# Patient Record
Sex: Female | Born: 1985 | Race: White | Hispanic: No | Marital: Single | Smoking: Never smoker
Health system: Southern US, Community
[De-identification: ages and names within clinical notes are randomized; demographics above are authoritative.]

## PROBLEM LIST (undated history)

## (undated) DIAGNOSIS — K589 Irritable bowel syndrome without diarrhea: Secondary | ICD-10-CM

## (undated) DIAGNOSIS — Q796 Ehlers-Danlos syndrome, unspecified: Secondary | ICD-10-CM

## (undated) DIAGNOSIS — F329 Major depressive disorder, single episode, unspecified: Secondary | ICD-10-CM

## (undated) DIAGNOSIS — K831 Obstruction of bile duct: Secondary | ICD-10-CM

## (undated) DIAGNOSIS — K529 Noninfective gastroenteritis and colitis, unspecified: Secondary | ICD-10-CM

## (undated) DIAGNOSIS — M40204 Unspecified kyphosis, thoracic region: Secondary | ICD-10-CM

## (undated) DIAGNOSIS — F419 Anxiety disorder, unspecified: Secondary | ICD-10-CM

## (undated) DIAGNOSIS — E230 Hypopituitarism: Secondary | ICD-10-CM

## (undated) DIAGNOSIS — Z8659 Personal history of other mental and behavioral disorders: Secondary | ICD-10-CM

## (undated) DIAGNOSIS — E282 Polycystic ovarian syndrome: Secondary | ICD-10-CM

## (undated) DIAGNOSIS — F4010 Social phobia, unspecified: Secondary | ICD-10-CM

## (undated) DIAGNOSIS — G4730 Sleep apnea, unspecified: Secondary | ICD-10-CM

## (undated) DIAGNOSIS — Z86018 Personal history of other benign neoplasm: Secondary | ICD-10-CM

## (undated) DIAGNOSIS — N938 Other specified abnormal uterine and vaginal bleeding: Secondary | ICD-10-CM

## (undated) DIAGNOSIS — E88819 Insulin resistance, unspecified: Secondary | ICD-10-CM

## (undated) DIAGNOSIS — K859 Acute pancreatitis without necrosis or infection, unspecified: Secondary | ICD-10-CM

## (undated) DIAGNOSIS — F4310 Post-traumatic stress disorder, unspecified: Secondary | ICD-10-CM

## (undated) DIAGNOSIS — J45909 Unspecified asthma, uncomplicated: Secondary | ICD-10-CM

## (undated) DIAGNOSIS — G43909 Migraine, unspecified, not intractable, without status migrainosus: Secondary | ICD-10-CM

## (undated) DIAGNOSIS — K219 Gastro-esophageal reflux disease without esophagitis: Secondary | ICD-10-CM

## (undated) DIAGNOSIS — Z8489 Family history of other specified conditions: Secondary | ICD-10-CM

## (undated) DIAGNOSIS — Q638 Other specified congenital malformations of kidney: Secondary | ICD-10-CM

## (undated) DIAGNOSIS — I499 Cardiac arrhythmia, unspecified: Secondary | ICD-10-CM

## (undated) DIAGNOSIS — I951 Orthostatic hypotension: Secondary | ICD-10-CM

## (undated) DIAGNOSIS — Z8719 Personal history of other diseases of the digestive system: Secondary | ICD-10-CM

## (undated) HISTORY — DX: Unspecified kyphosis, thoracic region: M40.204

## (undated) HISTORY — DX: Other specified congenital malformations of kidney: Q63.8

## (undated) HISTORY — DX: Migraine, unspecified, not intractable, without status migrainosus: G43.909

## (undated) HISTORY — DX: Acute pancreatitis without necrosis or infection, unspecified: K85.90

## (undated) HISTORY — DX: Obstruction of bile duct: K83.1

## (undated) HISTORY — PX: TONSILLECTOMY: SUR1361

## (undated) HISTORY — DX: Polycystic ovarian syndrome: E28.2

## (undated) HISTORY — PX: BLADDER SURGERY: SHX569

---
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
## 2007-01-19 ENCOUNTER — Encounter (INDEPENDENT_AMBULATORY_CARE_PROVIDER_SITE_OTHER): Payer: Self-pay | Admitting: Nurse Practitioner

## 2007-01-19 LAB — CONVERTED CEMR LAB: TSH: 2.64 microintl units/mL

## 2007-01-20 ENCOUNTER — Telehealth (INDEPENDENT_AMBULATORY_CARE_PROVIDER_SITE_OTHER): Payer: Self-pay | Admitting: *Deleted

## 2007-02-02 ENCOUNTER — Encounter (INDEPENDENT_AMBULATORY_CARE_PROVIDER_SITE_OTHER): Payer: Self-pay | Admitting: Nurse Practitioner

## 2007-02-02 ENCOUNTER — Ambulatory Visit: Payer: Self-pay | Admitting: Internal Medicine

## 2007-02-02 DIAGNOSIS — Q638 Other specified congenital malformations of kidney: Secondary | ICD-10-CM

## 2007-02-02 DIAGNOSIS — K869 Disease of pancreas, unspecified: Secondary | ICD-10-CM | POA: Insufficient documentation

## 2007-02-02 DIAGNOSIS — M4120 Other idiopathic scoliosis, site unspecified: Secondary | ICD-10-CM | POA: Insufficient documentation

## 2007-02-02 DIAGNOSIS — R9431 Abnormal electrocardiogram [ECG] [EKG]: Secondary | ICD-10-CM

## 2007-02-02 DIAGNOSIS — F988 Other specified behavioral and emotional disorders with onset usually occurring in childhood and adolescence: Secondary | ICD-10-CM | POA: Insufficient documentation

## 2007-02-02 DIAGNOSIS — R002 Palpitations: Secondary | ICD-10-CM

## 2007-02-02 DIAGNOSIS — J309 Allergic rhinitis, unspecified: Secondary | ICD-10-CM | POA: Insufficient documentation

## 2007-02-02 DIAGNOSIS — F4010 Social phobia, unspecified: Secondary | ICD-10-CM

## 2007-02-02 DIAGNOSIS — J4550 Severe persistent asthma, uncomplicated: Secondary | ICD-10-CM | POA: Insufficient documentation

## 2007-02-02 DIAGNOSIS — F810 Specific reading disorder: Secondary | ICD-10-CM | POA: Insufficient documentation

## 2007-02-03 ENCOUNTER — Encounter (INDEPENDENT_AMBULATORY_CARE_PROVIDER_SITE_OTHER): Payer: Self-pay | Admitting: Internal Medicine

## 2007-02-10 ENCOUNTER — Encounter (INDEPENDENT_AMBULATORY_CARE_PROVIDER_SITE_OTHER): Payer: Self-pay | Admitting: Nurse Practitioner

## 2007-02-10 ENCOUNTER — Ambulatory Visit (HOSPITAL_COMMUNITY): Admission: RE | Admit: 2007-02-10 | Discharge: 2007-02-10 | Payer: Self-pay | Admitting: Nurse Practitioner

## 2007-02-13 ENCOUNTER — Telehealth (INDEPENDENT_AMBULATORY_CARE_PROVIDER_SITE_OTHER): Payer: Self-pay | Admitting: Nurse Practitioner

## 2007-02-27 ENCOUNTER — Encounter (INDEPENDENT_AMBULATORY_CARE_PROVIDER_SITE_OTHER): Payer: Self-pay | Admitting: Nurse Practitioner

## 2007-03-12 ENCOUNTER — Encounter (INDEPENDENT_AMBULATORY_CARE_PROVIDER_SITE_OTHER): Payer: Self-pay | Admitting: Nurse Practitioner

## 2007-05-13 ENCOUNTER — Ambulatory Visit: Payer: Self-pay | Admitting: Nurse Practitioner

## 2007-05-13 LAB — CONVERTED CEMR LAB
AST: 17 units/L (ref 0–37)
Alkaline Phosphatase: 124 units/L — ABNORMAL HIGH (ref 39–117)
BUN: 11 mg/dL (ref 6–23)
Basophils Absolute: 0.1 10*3/uL (ref 0.0–0.1)
Basophils Relative: 1 % (ref 0–1)
Calcium: 9.9 mg/dL (ref 8.4–10.5)
Creatinine, Ser: 0.84 mg/dL (ref 0.40–1.20)
Eosinophils Absolute: 0.4 10*3/uL (ref 0.0–0.7)
Eosinophils Relative: 3 % (ref 0–5)
Hemoglobin: 14.6 g/dL (ref 12.0–15.0)
MCHC: 32.3 g/dL (ref 30.0–36.0)
MCV: 86.3 fL (ref 78.0–100.0)
Monocytes Absolute: 0.5 10*3/uL (ref 0.1–1.0)
Monocytes Relative: 5 % (ref 3–12)
RBC: 5.24 M/uL — ABNORMAL HIGH (ref 3.87–5.11)
RDW: 14.4 % (ref 11.5–15.5)
Total Bilirubin: 0.5 mg/dL (ref 0.3–1.2)

## 2008-02-05 DIAGNOSIS — I499 Cardiac arrhythmia, unspecified: Secondary | ICD-10-CM

## 2008-02-05 HISTORY — DX: Cardiac arrhythmia, unspecified: I49.9

## 2010-04-09 ENCOUNTER — Inpatient Hospital Stay (INDEPENDENT_AMBULATORY_CARE_PROVIDER_SITE_OTHER)
Admission: RE | Admit: 2010-04-09 | Discharge: 2010-04-09 | Disposition: A | Discharge status: Home / Self Care | Payer: Self-pay | Source: Ambulatory Visit | Attending: Family Medicine | Admitting: Family Medicine

## 2010-04-09 DIAGNOSIS — R197 Diarrhea, unspecified: Secondary | ICD-10-CM

## 2010-06-22 NOTE — Discharge Summary (Signed)
   NAME:  Kristen [name], Kristen [name]                       ACCOUNT NO.:  [id number]   MEDICAL RECORD NO.:  [id number]                   PATIENT TYPE:  OIB   LOCATION:  6123                                 FACILITY:  [facility]   PHYSICIAN:  Gabrielle Dare. Janee Morn, M.D.             DATE OF BIRTH:  08-09-1985   DATE OF ADMISSION:  11/26/2001  DATE OF DISCHARGE:  11/27/2001                                 DISCHARGE SUMMARY   DISCHARGE DIAGNOSIS:  Status post laparoscopic cholecystectomy.   HISTORY OF PRESENT ILLNESS:  The patient  is a 25 year old white female  who  had a recent episode of biliary pancreatitis managed as an  outpatient by  her primary care physician.  Workup at that time demonstrated cholelithiasis  so she scheduled for a laparoscopic cholecystectomy.   HOSPITAL COURSE:  The patient was brought to the hospital.  She underwent  laparoscopic cholecystectomy with intraoperative cholangiogram.  She  tolerated the procedure well without complications.  The cholangiogram  demonstrated no filling defects in the common bile duct, but did good flow  of contrast into the duodenum.  Postoperatively, she remained  hemodynamically stable.  She had good pain control and tolerated gradual  advancement of her diet and she is discharged home in stable condition.   DISCHARGE DIET:  Regular.   DISCHARGE ACTIVITIES:  As tolerated with no lifting.   DISCHARGE MEDICATIONS:  Percocet 5/325 one p.o. q.6h p.r.n.  pain.   FOLLOW UP:  The patient  is to call my office at [phone] to schedule an  appointment to follow up in two weeks.                                               Gabrielle Dare Janee Morn, M.D.    BET/MEDQ  D:  11/27/2001  T:  11/28/2001  Job:  [id number]

## 2010-06-22 NOTE — Op Note (Signed)
[place]  Patient:    Kristen [name], Kristen [name]                      MRN: [id number] Proc. Date: 06/15/99 Adm. Date:  [id number] Disc. Date: [id number] Attending:  Lucky Cowboy CC:         [facility]             Gretta Arab. Valentina Lucks, M.D.                           Operative Report  PREOPERATIVE DIAGNOSIS:  Recurrent tonsillitis, obstructive sleep apnea.  POSTOPERATIVE DIAGNOSIS:  Recurrent tonsillitis, obstructive sleep apnea.  OPERATION:  Adenotonsillectomy.  SURGEON:  Lucky Cowboy, M.D.  ANESTHESIA:  General endotracheal  ESTIMATED BLOOD LOSS:  50 cc  SPECIMENS:  Adenoid and tonsils.  COMPLICATIONS:  None.  INDICATION FOR PROCEDURE:  The patient is a 25 year old female with a two year history of intermittent throat infections.  There have been increasing episodes of tonsillitis.  She has had significant gaging when eating her food. There was constant mouth breathing and heavy snorer with periods of apnea during sleep.  FINDINGS:  The patient was noted to have bilateral kissing palatine tonsils in and a moderate amount of adenoid hypertrophy.  There was partial obstruction of the posterior choana, however, there was also significant edema of both inferior turbinates.  There was significant cryptic and somewhat necrotic appearing tonsils.  DESCRIPTION OF PROCEDURE:  The patient was taken to the operating room and placed on the table in the supine position.  She was then placed under general endotracheal anesthesia and the table rotated counter clockwise 90 degrees. Bacitracin ointment was placed on the lips.  The head and body were draped. A Crowe-Davis mouth with #3 tongue blade was then placed intraorally, open and suspended on the Mayo stand. Palpation of the soft palate was without evidence of a submucosal cleft.  Red rubber catheter was placed down the right nostril, brought out through the oral cavity and was secured in  place with hemostat. Inspection of the nasopharynx was performed using a mirror.  A medium sized adenoid curet was placed against the vomer, directed inferiorly, thus severing the majority of the adenoid pad.  The remainder was removed with tonsil [place]air forceps again under indirect visualization.  Three sterile gauze Afrin soaked packs were placed in the nasopharynx and time allowed for hemostasis. Soft palate was then relaxed and attention turned to the tonsillectomy portion of the procedure.  The right palatine tonsil was grasped with Allis clamps and directed inferior medially.  A #12 blade was used to make an incision on the anterior tonsillar pillar. Snare was then used to resect tonsil and two sterile gauze packs placed.  Attention was turned to the left palatine tonsil which was removed in identical fashion and two sterile gauze packs placed.  Mouth gag was relaxed and time allowed for hemostasis. Mouth gauze was removed after five minutes and all packs were removed.  Hemostasis was achieved in the adenoid pad and both tonsillar fossa using suction cautery.  Suction cautery was used under indirect visualization in the nasopharynx with mirror.  At this point, there was no residual bleeding.  The mouth gag was relaxed and reopened. An NG tube was passed down the esophagus for suction of the gastric contents. The mouth gag was then removed noting no damage to the teeth or soft  tissues.  Table was then rotated clock wise 90 degrees to its original position.  The patient was awakened from anesthesia and extubated in the operating room. She was taken to the post anesthesia care unit in stable condition.  There were no complications. DD:  06/15/99 TD:  06/18/99 Job: [id number]

## 2010-06-22 NOTE — Op Note (Signed)
NAME:  Kristen [name], Kristen [name]                       ACCOUNT NO.:  [id number]   MEDICAL RECORD NO.:  [id number]                   PATIENT TYPE:  OIB   LOCATION:  2867                                 FACILITY:  [facility]   PHYSICIAN:  Gabrielle Dare. Janee Morn, M.D.             DATE OF BIRTH:  04/05/1985   DATE OF PROCEDURE:  11/26/2001  DATE OF DISCHARGE:                                 OPERATIVE REPORT   PREOPERATIVE DIAGNOSES:  Cholelithiasis with recent biliary pancreatitis.   POSTOPERATIVE DIAGNOSES:  Cholelithiasis with recent biliary pancreatitis.   PROCEDURE:  Laparoscopic cholecystectomy with intraoperative cholangiogram.   SURGEON:  Gabrielle Dare. Janee Morn, M.D.   ASSISTANT:  [place]. Daphine Deutscher, M.D.   HISTORY OF PRESENT ILLNESS:  The patient is a 25 year old white female who  had a recent episode of biliary pancreatitis that was managed as an  outpatient by her primary care doctor.  Ultrasound and CT of the abdomen  revealed cholelithiasis, and she was evaluated and scheduled for elective  laparoscopic cholecystectomy with intraoperative cholangiogram.   PROCEDURE IN DETAIL:  The patient was brought to the operating room.  General anesthesia was administered.  The patient's abdomen was prepped and  draped in a sterile fashion.  A semilunar infraumbilical incision was made.  The subcutaneous tissues were dissected down.  The anterior fascia was  divided sharply, and the peritoneal cavity was entered without difficulty.  The Hasson trocar catheter was then placed after a pursestring 2-0 Vicryl  suture was placed in the fascia.  The Hasson was secured with its  pursestring, and subsequently under direct vision, the 10 mm epigastric and  two 5 mm lateral ports were placed.  The some of the gallbladder was  retracted superomedially, and the infundibulum was retracted  inferolaterally.  A few small adhesions were taken down bluntly from the  gallbladder.  Subsequently, the dissection commenced on  the lateral aspect  of the gallbladder and subsequently continued medially in the triangle of  Calot, and the cystic duct was easily identified.  The dissection continued  until a large window was made between cystic duct and the gallbladder and  the liver, and subsequently, a clip was placed at the floor of the cystic  duct towards the infundibulum.  A small nick was made in the cystic duct,  and the cholangiogram catheter was inserted.  Subsequently, an  intraoperative cholangiogram was shot, demonstrating no filling defects in  the distal common bile duct, and the contrast flowed into the duodenum  without difficulty.  Subsequently, the cholangiogram catheter was removed,  and three proximal clips were placed on the cystic duct, and it was divided.  Subsequently, further dissection in the area revealed the cystic artery.  This was clipped proximally and once distally and divided.  The clips going  into the gallbladder were taken off the liver bed with the hook cautery.  Several areas along the liver bed were cauterized for  good hemostasis.  The  gallbladder was removed from the liver bed and placed in an EndoCatch bag  and removed from the umbilical port.  Subsequently, the area in the right  upper quadrant was copiously irrigated.  The meticulous hemostasis was  ensured in the liver bed, and the clips remained in good position.  Subsequently, the irrigant fluid was removed from the abdomen once it  irrigated clear, and the ports were removed under direct vision, and the  Hasson was removed from the umbilical port.  Also, the umbilical incision  was closed by tieing the Vicryl pursestring suture.  Once this was  accomplished, all of the wounds  were irrigated and subsequently closed with running 4-0 Vicryl subcuticular  stitch.  Benzoin and Steri-Strips were applied.  Sponge and needle counts  were all correct.  The patient tolerated the procedure well without  complications and was  taken to the recovery room in stable condition.                                               Gabrielle Dare Janee Morn, M.D.    BET/MEDQ  D:  11/26/2001  T:  11/27/2001  Job:  [id number]

## 2010-11-12 ENCOUNTER — Inpatient Hospital Stay (INDEPENDENT_AMBULATORY_CARE_PROVIDER_SITE_OTHER)
Admission: RE | Admit: 2010-11-12 | Discharge: 2010-11-12 | Disposition: A | Discharge status: Home / Self Care | Payer: Self-pay | Source: Ambulatory Visit | Attending: Family Medicine | Admitting: Family Medicine

## 2010-11-12 DIAGNOSIS — N390 Urinary tract infection, site not specified: Secondary | ICD-10-CM

## 2010-11-12 LAB — POCT URINALYSIS DIP (DEVICE)
Bilirubin Urine: NEGATIVE
Ketones, ur: NEGATIVE mg/dL
Specific Gravity, Urine: 1.015 (ref 1.005–1.030)
pH: 6.5 (ref 5.0–8.0)

## 2011-02-05 DIAGNOSIS — F419 Anxiety disorder, unspecified: Secondary | ICD-10-CM

## 2011-02-05 HISTORY — DX: Anxiety disorder, unspecified: F41.9

## 2011-08-26 ENCOUNTER — Encounter (HOSPITAL_COMMUNITY): Payer: Self-pay | Admitting: *Deleted

## 2011-08-26 DIAGNOSIS — J45909 Unspecified asthma, uncomplicated: Secondary | ICD-10-CM | POA: Insufficient documentation

## 2011-08-26 DIAGNOSIS — R079 Chest pain, unspecified: Secondary | ICD-10-CM | POA: Insufficient documentation

## 2011-08-26 DIAGNOSIS — I10 Essential (primary) hypertension: Secondary | ICD-10-CM | POA: Insufficient documentation

## 2011-08-26 LAB — PREGNANCY, URINE: Preg Test, Ur: NEGATIVE

## 2011-08-26 NOTE — ED Notes (Signed)
The pt is c/o lt sided chest pain since this am..  She feels tired and both arms are heavy

## 2011-08-27 ENCOUNTER — Emergency Department (HOSPITAL_COMMUNITY): Payer: Self-pay

## 2011-08-27 ENCOUNTER — Emergency Department (HOSPITAL_COMMUNITY)
Admission: EM | Admit: 2011-08-27 | Discharge: 2011-08-27 | Disposition: A | Discharge status: Home / Self Care | Payer: Self-pay | Attending: Emergency Medicine | Admitting: Emergency Medicine

## 2011-08-27 DIAGNOSIS — R079 Chest pain, unspecified: Secondary | ICD-10-CM

## 2011-08-27 DIAGNOSIS — I493 Ventricular premature depolarization: Secondary | ICD-10-CM

## 2011-08-27 HISTORY — DX: Unspecified asthma, uncomplicated: J45.909

## 2011-08-27 HISTORY — DX: Acute pancreatitis without necrosis or infection, unspecified: K85.90

## 2011-08-27 LAB — COMPREHENSIVE METABOLIC PANEL
Albumin: 3.9 g/dL (ref 3.5–5.2)
BUN: 12 mg/dL (ref 6–23)
Chloride: 104 mEq/L (ref 96–112)
Creatinine, Ser: 0.91 mg/dL (ref 0.50–1.10)
GFR calc Af Amer: >90 mL/min (ref >90)
GFR calc non Af Amer: 88 mL/min — ABNORMAL LOW (ref >90)
Glucose, Bld: 86 mg/dL (ref 70–99)
Total Bilirubin: 0.5 mg/dL (ref 0.3–1.2)

## 2011-08-27 LAB — CBC WITH DIFFERENTIAL/PLATELET
Basophils Relative: 1 % (ref 0–1)
HCT: 43.5 % (ref 36.0–46.0)
Hemoglobin: 14.5 g/dL (ref 12.0–15.0)
Lymphs Abs: 3.9 10*3/uL (ref 0.7–4.0)
MCH: 29.2 pg (ref 26.0–34.0)
MCHC: 33.3 g/dL (ref 30.0–36.0)
Monocytes Absolute: 0.5 10*3/uL (ref 0.1–1.0)
Monocytes Relative: 5 % (ref 3–12)
Neutro Abs: 6.1 10*3/uL (ref 1.7–7.7)

## 2011-08-27 LAB — TROPONIN I: Troponin I: <0.3 ng/mL (ref <0.30)

## 2011-08-27 NOTE — ED Notes (Addendum)
Patient complaining of chest pain that woke her up from sleep this morning; patient states that location of chest pain is left-sided; pain is intermittent -- sometimes sharp and sometimes dull.  Patient rates pain 5/10 on the numerical pain scale.  Patient denies nausea, vomiting, diarrhea, shortness of breath, and diaphoresis.  Patient denies any respiratory complaints; reports history of asthma.  Patient alert and oriented x4; PERRL present.  Upon arrival to room, patient changed into gown and connected to continuous cardiac, pulse ox, and blood pressure monitor.  Will continue to monitor.

## 2011-08-27 NOTE — ED Notes (Signed)
Patient currently resting quietly in bed; no respiratory or acute distress noted.  Patient updated on plan of care; informed patient that we are currently waiting on orders from EDP; patient has no other questions or concerns at this time; will continue to monitor.

## 2011-08-27 NOTE — ED Provider Notes (Signed)
History     CSN: [id number]  Arrival date & time 08/26/11  2312   First MD Initiated Contact with Patient 08/27/11 0201      Chief Complaint  Patient presents with  . Chest Pain    (Consider location/radiation/quality/duration/timing/severity/associated sxs/prior treatment) Patient is a 26 y.o. female presenting with chest pain. The history is provided by the patient.  Chest Pain The chest pain began yesterday. Duration of episode(s) is 3 seconds. Chest pain occurs intermittently. The chest pain is unchanged. At its most intense, the pain is at 3/10. The pain is currently at 1/10. The severity of the pain is mild. The quality of the pain is described as aching and brief. The pain does not radiate. Primary symptoms include fatigue. Pertinent negatives for primary symptoms include no syncope, no shortness of breath, no cough, no wheezing, no nausea and no vomiting. She tried nothing for the symptoms.     Past Medical History  Diagnosis Date  . Pancreatitis   . Hypertension   . Asthma     History reviewed. No pertinent past surgical history.  No family history on file.  History  Substance Use Topics  . Smoking status: Never Smoker   . Smokeless tobacco: Not on file  . Alcohol Use: No    OB History    Grav Para Term Preterm Abortions TAB SAB Ect Mult Living                  Review of Systems  Constitutional: Positive for fatigue.  Respiratory: Negative for cough, shortness of breath and wheezing.   Cardiovascular: Positive for chest pain. Negative for syncope.  Gastrointestinal: Negative for nausea and vomiting.  All other systems reviewed and are negative.    Allergies  Mucinex  Home Medications  No current outpatient prescriptions on file.  BP 116/61  Pulse 86  Temp 99 F (37.2 C) (Oral)  Resp 15  SpO2 99%  LMP 08/25/2011  Physical Exam  Constitutional: She is oriented to person, place, and time. She appears well-developed and well-nourished.  HENT:   Head: Normocephalic and atraumatic.  Eyes: Conjunctivae and EOM are normal. Pupils are equal, round, and reactive to light.  Neck: Normal range of motion.  Cardiovascular: Normal rate, regular rhythm and normal heart sounds.   Pulmonary/Chest: Effort normal and breath sounds normal.  Abdominal: Soft. Bowel sounds are normal.  Musculoskeletal: Normal range of motion.  Neurological: She is alert and oriented to person, place, and time.  Skin: Skin is warm and dry.  Psychiatric: She has a normal mood and affect. Her behavior is normal.    ED Course  Procedures (including critical care time)  Labs Reviewed  CBC WITH DIFFERENTIAL - Abnormal; Notable for the following:    WBC 10.9 (*)     All other components within normal limits  COMPREHENSIVE METABOLIC PANEL - Abnormal; Notable for the following:    GFR calc non Af Amer 88 (*)     All other components within normal limits  TROPONIN I  PREGNANCY, URINE  D-DIMER, QUANTITATIVE  URINALYSIS, ROUTINE W REFLEX MICROSCOPIC   Dg Chest Port 1 View  08/27/2011  *RADIOLOGY REPORT*  Clinical Data: Chest pain.  PORTABLE CHEST - 1 VIEW  Comparison: None  Findings: Very low lung volumes with vascular crowding and atelectasis.  Heart size is grossly normal.  No definite effusions or pneumothorax.  IMPRESSION: Very low lung volumes with vascular crowding and atelectasis.  Original Report Authenticated By: P. Loralie Champagne, M.D.  No diagnosis found.   Date: 08/27/2011  Rate: 86  Rhythm: normal sinus rhythm  QRS Axis: normal  Intervals: normal  ST/T Wave abnormalities: normal  Conduction Disutrbances: frequent pvc  Narrative Interpretation: frequent pvc     MDM  Pt with pvc.  ce neg.  D-dimer neg.  Improved.  Will dc to oupt cards fu,  Ret new/worsening sxs        Yennifer Segovia Lytle Michaels, MD 08/27/11 [phone]

## 2012-01-10 ENCOUNTER — Encounter (HOSPITAL_COMMUNITY): Payer: Self-pay

## 2012-01-10 ENCOUNTER — Other Ambulatory Visit: Payer: Self-pay | Admitting: Obstetrics and Gynecology

## 2012-01-10 ENCOUNTER — Ambulatory Visit (HOSPITAL_COMMUNITY)
Admission: RE | Admit: 2012-01-10 | Discharge: 2012-01-10 | Disposition: A | Discharge status: Home / Self Care | Payer: Self-pay | Source: Ambulatory Visit | Attending: Obstetrics and Gynecology | Admitting: Obstetrics and Gynecology

## 2012-01-10 VITALS — BP 112/76 | Temp 97.9°F | Ht 67.0 in | Wt 303.2 lb

## 2012-01-10 DIAGNOSIS — N6489 Other specified disorders of breast: Secondary | ICD-10-CM

## 2012-01-10 DIAGNOSIS — Z01419 Encounter for gynecological examination (general) (routine) without abnormal findings: Secondary | ICD-10-CM

## 2012-01-10 DIAGNOSIS — N649 Disorder of breast, unspecified: Secondary | ICD-10-CM

## 2012-01-10 NOTE — Patient Instructions (Signed)
Taught patient how to perform BSE and gave educational materials to take home. Let her know [facility] will cover Pap smears every 3 years unless has a history of abnormal Pap smears. Patient referred to the [facility] for left breast ultrasound. Appointment scheduled for Monday, January 20, 2012 at 1430. Patient aware of appointment and will be there. Let patient know will follow up with her within the next couple weeks with results by letter or phone. Patient verbalized understanding.

## 2012-01-10 NOTE — Addendum Note (Signed)
Encounter addended by: Saintclair Halsted, RN on: 01/10/2012  4:52 PM<BR>     Documentation filed: Visit Diagnoses

## 2012-01-10 NOTE — Progress Notes (Addendum)
Complaints of knot on left breast that keeps coming back and bursting x 2 months.  Pap Smear:    Pap smear completed today. Per patient last Pap smear was 4 years ago at the [facility] and normal. Per patient no history of abnormal Pap smears. Pap smear results above are in EPIC.  Physical exam: Breasts Breasts symmetrical. No skin abnormalities right breast. Lump on left breast more at the surface at 5 o'clock that is scabbed over. Per patient the area had bursted.  No nipple retraction bilateral breasts. No nipple discharge bilateral breasts. No lymphadenopathy. No lumps palpated bilateral breasts. No complaints of pain or tenderness on exam. Patient referred to the [facility] for left breast ultrasound. Appointment scheduled for Monday, January 20, 2012 at 1430.            Pelvic/Bimanual   Ext Genitalia No lesions, no swelling and no discharge observed on external genitalia.         Vagina Vagina pink and normal texture. No lesions or discharge observed in vagina.          Cervix Cervix is present. Cervix pink and of normal texture. No discharge observed.     Uterus Uterus is present and palpable. Uterus in normal position and normal size.        Adnexae Bilateral ovaries present and unable to palpate. No tenderness on palpation.          Rectovaginal No rectal exam completed today since patient had no rectal complaints. No skin abnormalities observed on exam.

## 2012-01-20 ENCOUNTER — Other Ambulatory Visit: Payer: Self-pay

## 2012-01-23 ENCOUNTER — Ambulatory Visit
Admission: RE | Admit: 2012-01-23 | Discharge: 2012-01-23 | Disposition: A | Discharge status: Home / Self Care | Payer: No Typology Code available for payment source | Source: Ambulatory Visit | Attending: Obstetrics and Gynecology | Admitting: Obstetrics and Gynecology

## 2012-01-23 DIAGNOSIS — N6489 Other specified disorders of breast: Secondary | ICD-10-CM

## 2012-01-23 DIAGNOSIS — N649 Disorder of breast, unspecified: Secondary | ICD-10-CM

## 2012-01-27 ENCOUNTER — Telehealth (HOSPITAL_COMMUNITY): Payer: Self-pay | Admitting: *Deleted

## 2012-01-27 NOTE — Telephone Encounter (Signed)
Telephoned patient and left message to return call to [facility] 

## 2012-01-27 NOTE — Telephone Encounter (Signed)
Patient returned call and advised of normal pap smear results. Next pap smear due in 3 years. Patient voiced understanding.

## 2012-02-05 DIAGNOSIS — E282 Polycystic ovarian syndrome: Secondary | ICD-10-CM

## 2012-02-05 HISTORY — DX: Polycystic ovarian syndrome: E28.2

## 2012-02-06 NOTE — Addendum Note (Signed)
Encounter addended by: Apolonio Cutting Poll Kendale Rembold, RN on: 02/06/2012  4:53 PM<BR>     Documentation filed: Visit Diagnoses, Charges VN

## 2012-03-06 ENCOUNTER — Inpatient Hospital Stay (HOSPITAL_COMMUNITY)
Admission: AD | Admit: 2012-03-06 | Discharge: 2012-03-06 | Disposition: A | Discharge status: Home / Self Care | Payer: Self-pay | Source: Ambulatory Visit | Attending: Obstetrics and Gynecology | Admitting: Obstetrics and Gynecology

## 2012-03-06 DIAGNOSIS — Z3202 Encounter for pregnancy test, result negative: Secondary | ICD-10-CM | POA: Insufficient documentation

## 2012-03-06 DIAGNOSIS — N938 Other specified abnormal uterine and vaginal bleeding: Secondary | ICD-10-CM | POA: Insufficient documentation

## 2012-03-06 DIAGNOSIS — N949 Unspecified condition associated with female genital organs and menstrual cycle: Secondary | ICD-10-CM | POA: Insufficient documentation

## 2012-03-06 DIAGNOSIS — L732 Hidradenitis suppurativa: Secondary | ICD-10-CM | POA: Insufficient documentation

## 2012-03-06 DIAGNOSIS — N926 Irregular menstruation, unspecified: Secondary | ICD-10-CM

## 2012-03-06 MED ORDER — NORGESTIMATE-ETH ESTRADIOL 0.25-35 MG-MCG PO TABS: 1.0000 | ORAL_TABLET | Freq: Every day | ORAL | Status: DC

## 2012-03-06 NOTE — MAU Note (Signed)
Patient states she has had 3 negative home pregnancy tests but has not had a period since 11-18. Had one day of spotting since 11-18. Patient denies any pain or bleeding at this time.

## 2012-03-06 NOTE — MAU Provider Note (Signed)
Attestation of Attending Supervision of Advanced Practitioner (CNM/NP): Evaluation and management procedures were performed by the Advanced Practitioner under my supervision and collaboration.  I have reviewed the Advanced Practitioner's note and chart, and I agree with the management and plan.  Vieva Brummitt 03/06/2012 9:19 PM   

## 2012-03-06 NOTE — MAU Provider Note (Signed)
S: 27 y.o. G0P0 presents to MAU for pregnancy test.  She has had 3 negative tests at home but no menses since November.  She has always had irregular menses when not on birth control.  She also mentioned cysts under her breasts, underarms, and inner thighs.  These are not painful, have no odor, and she has had them since early adolescence.  They also run in her family.  She has never had a diagnosis for them.  She denies pain, vaginal bleeding, n/v, h/a, urinary symptoms, vaginal itching/burning, or fever/chills.  O: BP 139/90  Pulse 97  Temp 97.5 F (36.4 C) (Oral)  Resp 20  Ht 5' 5.5" (1.664 m)  Wt 139.164 kg (306 lb 12.8 oz)  BMI 50.28 kg/m2  SpO2 98%  LMP 12/23/2011  Results for orders placed during the hospital encounter of 03/06/12 (from the past 24 hour(s))  POCT PREGNANCY, URINE     Status: Normal   Collection Time   03/06/12  2:35 PM      Component Value Range   Preg Test, Ur NEGATIVE  NEGATIVE   A: 1. Hydradenitis   2. Negative pregnancy test   3. Irregular menses     P:  D/C home OCPs x3 months Discussed skin symptoms with pt and diagnosis of hydradenitis.  Programme researcher, broadcasting/film/video given. Follow up in [facility] in 2-3 months, message sent today. Return to MAU as needed   Sharen Counter Certified Nurse-Midwife

## 2012-03-10 ENCOUNTER — Encounter: Payer: Self-pay | Admitting: Medical

## 2012-04-17 ENCOUNTER — Ambulatory Visit (INDEPENDENT_AMBULATORY_CARE_PROVIDER_SITE_OTHER): Payer: Self-pay | Admitting: Medical

## 2012-04-17 ENCOUNTER — Encounter: Payer: Self-pay | Admitting: Medical

## 2012-04-17 VITALS — BP 136/81 | HR 61 | Ht 68.0 in | Wt 309.3 lb

## 2012-04-17 DIAGNOSIS — N926 Irregular menstruation, unspecified: Secondary | ICD-10-CM

## 2012-04-17 MED ORDER — NORGESTIMATE-ETH ESTRADIOL 0.25-35 MG-MCG PO TABS: 1.0000 | ORAL_TABLET | Freq: Every day | ORAL | Status: DC

## 2012-04-17 NOTE — Progress Notes (Signed)
Patient ID: Kristen [name], female   DOB: [birth year], 27 y.o.   MRN: [id number]  History:  Kristen [name]  is a 27 y.o. G0P0 who presents to clinic today for follow-up from MAU. The patient was seen in MAU on 03/06/12 for irregular menses. She was given OCPs at the time to help regulate her cycles. She has had two regular cycles since then without complications. LMP 04/16/12. The patient states that periods have been lighter without cramping. She denies N/V or headaches. The patient states that her history of HTN was strictly stress induced and when she was put on medication her BP dropped causing lightheadedness and near syncope. BP is ok today after 2 months of OCPs.   The following portions of the patient's history were reviewed and updated as appropriate: allergies, current medications, past family history, past medical history, past social history, past surgical history and problem list.  Review of Systems:  Pertinent items are noted in HPI.  Objective:  Physical Exam BP 136/81  Pulse 61  Ht 5\' 8"  (1.727 m)  Wt 309 lb 4.8 oz (140.298 kg)  BMI 47.04 kg/m2  LMP 04/16/2012 GENERAL: Well-developed, obese female in no acute distress.  HEENT: Normocephalic, atraumatic.  LUNGS: Normal rate. Clear to auscultation bilaterally.  HEART: Regular rate and rhythm with no adventitious sounds.  EXTREMITIES: No cyanosis, clubbing, or edema. SKIN: Warm, dry and without erythema  Assessment & Plan:  Assessment: Irregular menses, most likely PCOS  Plans: Rx for Sprintec sent to patient's pharmacy (refills x 11) Patient will return to clinic in 3 months for BP check Patient will return in one year for annual exam and discussion of further management or sooner PRN  [name], PA-C 04/17/2012 8:46 AM

## 2012-04-17 NOTE — Patient Instructions (Signed)
Oral Contraception Use Oral contraceptives (OCs) are medicines taken to prevent pregnancy. OCs work by preventing the ovaries from releasing eggs. The hormones in OCs also cause the cervical mucus to thicken, preventing the sperm from entering the uterus. The hormones also cause the uterine lining to become thin, not allowing a fertilized egg to attach to the inside of the uterus. OCs are highly effective when taken exactly as prescribed. However, OCs do not prevent sexually transmitted diseases (STDs). Safe sex practices, such as using condoms along with an OC, can help prevent STDs.  Before taking OCs, you may have a physical exam and Pap test. Your caregiver may also order blood tests if necessary. Your caregiver will make sure you are a good candidate for oral contraception. Discuss with your caregiver the possible side effects of the OC you may be prescribed. When starting an OC, it can take 2 to 3 months for the body to adjust to the changes in hormone levels in your body.  HOW TO TAKE ORAL CONTRACEPTIVES Your caregiver may advise you on how to start taking the first cycle of OCs. Otherwise, you can:  Start on day 1 of your menstrual period. You will not need any backup contraceptive protection with this start time.  Start on the first Sunday after your menstrual period or the day you get your prescription. In these cases, you will need to use backup contraceptive protection for the first 7-day cycle. After you have started taking OCs:  If you forget to take 1 pill, take it as soon as you remember. Take the next pill at the regular time.  If you miss 2 or more pills, use backup birth control until your next menstrual period starts.  If you use a 28-day pack that contains inactive pills and you miss 1 of the last 7 pills (pills with no hormones), it will not matter. Throw away the rest of the non-hormone pills and start a new pill pack. No matter which day you start the OC, you will always start  a new pack on that same day of the week. Have an extra pack of OCs and a backup contraceptive method available in case you miss some pills or lose your OC pack. HOME CARE INSTRUCTIONS   Do not smoke.  Always use a condom to protect against STDs. OCs do not protect against STDs.  Use a calendar to mark your menstrual period days.  Read the information and directions that come with your OC. Talk to your caregiver if you have questions. SEEK MEDICAL CARE IF:   You develop nausea and vomiting.  You have abnormal vaginal discharge or bleeding.  You develop a rash.  You miss your menstrual period.  You are losing your hair.  You need treatment for mood swings or depression.  You get dizzy when taking the OC.  You develop acne from taking the OC.  You become pregnant. SEEK IMMEDIATE MEDICAL CARE IF:   You develop chest pain.  You develop shortness of breath.  You have an uncontrolled or severe headache.  You develop numbness or slurred speech.  You develop visual problems.  You develop pain, redness, and swelling in the legs. Document Released: 01/10/2011 Document Revised: 04/15/2011 Document Reviewed: 01/10/2011 Exit[employer] Patient Information 2013 [employer].  

## 2013-06-26 ENCOUNTER — Encounter (HOSPITAL_COMMUNITY): Payer: Self-pay | Admitting: Emergency Medicine

## 2013-06-26 ENCOUNTER — Emergency Department (HOSPITAL_COMMUNITY): Payer: Self-pay

## 2013-06-26 ENCOUNTER — Emergency Department (HOSPITAL_COMMUNITY)
Admission: EM | Admit: 2013-06-26 | Discharge: 2013-06-26 | Disposition: A | Discharge status: Home / Self Care | Payer: Self-pay | Attending: Emergency Medicine | Admitting: Emergency Medicine

## 2013-06-26 DIAGNOSIS — T148XXA Other injury of unspecified body region, initial encounter: Secondary | ICD-10-CM

## 2013-06-26 DIAGNOSIS — IMO0002 Reserved for concepts with insufficient information to code with codable children: Secondary | ICD-10-CM | POA: Insufficient documentation

## 2013-06-26 DIAGNOSIS — Y9389 Activity, other specified: Secondary | ICD-10-CM | POA: Insufficient documentation

## 2013-06-26 DIAGNOSIS — M25561 Pain in right knee: Secondary | ICD-10-CM

## 2013-06-26 DIAGNOSIS — Z8719 Personal history of other diseases of the digestive system: Secondary | ICD-10-CM | POA: Insufficient documentation

## 2013-06-26 DIAGNOSIS — Z79899 Other long term (current) drug therapy: Secondary | ICD-10-CM | POA: Insufficient documentation

## 2013-06-26 DIAGNOSIS — I10 Essential (primary) hypertension: Secondary | ICD-10-CM | POA: Insufficient documentation

## 2013-06-26 DIAGNOSIS — S62609A Fracture of unspecified phalanx of unspecified finger, initial encounter for closed fracture: Secondary | ICD-10-CM

## 2013-06-26 DIAGNOSIS — J45909 Unspecified asthma, uncomplicated: Secondary | ICD-10-CM | POA: Insufficient documentation

## 2013-06-26 DIAGNOSIS — Y9289 Other specified places as the place of occurrence of the external cause: Secondary | ICD-10-CM | POA: Insufficient documentation

## 2013-06-26 MED ORDER — HYDROCODONE-ACETAMINOPHEN 5-325 MG PO TABS: ORAL_TABLET | ORAL | Status: DC

## 2013-06-26 NOTE — Discharge Instructions (Signed)
Rest, Ice intermittently (in the first 24-48 hours), Gentle compression with an Ace wrap, and elevate (Limb above the level of the heart) °  °Take up to 800mg of ibuprofen (that is usually 4 over the counter pills)  3 times a day for 5 days. Take with food. ° °Take vicodin for breakthrough pain, do not drink alcohol, drive, care for children or do other critical tasks while taking vicodin. ° °Do not hesitate to return to the Emergency Department for any new, worsening or concerning symptoms.  ° °If you do not have a primary care doctor you can establish one at the  ° °[facility]: °[address] °[place]  [address] °[phone] ° °After you establish care. Let them know you were seen in the emergency room. They must obtain records for further management.  ° ° ° °

## 2013-06-26 NOTE — ED Notes (Addendum)
Pt present to ED for fall. Pt states she tripped and fell, hit ground with right knee and right hand. Bruise, swelling and abrasion noted and right knee. Pt not able to bend right knee. Swelling and deformity noted at right ring finger. Sensation and right pedal and radial pulse present. Pt states she had Tetanus vaccine within 2013/2014.

## 2013-06-26 NOTE — ED Notes (Signed)
Ortho notified to apply splint

## 2013-06-26 NOTE — Progress Notes (Signed)
Orthopedic Tech Progress Note Patient Details:  Kristen [name] 1985/10/08 [id number]  Ortho Devices Type of Ortho Device: Crutches;Knee Immobilizer;Finger splint Ortho Device/Splint Location: rue/l;le Ortho Device/Splint Interventions: Application   [name] 06/26/2013, 10:31 PM

## 2013-06-26 NOTE — ED Provider Notes (Signed)
CSN: [id number]     Arrival date & time 06/26/13  1942 History  This chart was scribed for non-physician practitioner Wynetta EmeryNicole Vertis Bauder  working with Shanna CiscoMegan E Docherty, MD by Elveria Risingimelie Horne, ED Scribe. This patient was seen in room TR09C/TR09C and the patient's care was started at 8:07 PM.   Chief Complaint  Patient presents with  . Knee Injury  . Hand Injury      The history is provided by the patient. No language interpreter was used.   HPI Comments: Kristen LaserStephanie Y Ticas is a 28 y.o. female who presents to the Emergency Department with right knee and right hand injury after a fall that occurred less than two hours ago. Patient reports attempting to get out of the back seat of a two door car, tangling her foot in the seat belt, falling to the ground, and landing on her right knee and right hand. Patient has abrasions and swelling of the right knee and pain and bruising at her right ring finger. Patient denies right ankle pain.  Patient is ambulatory with pain.   Patient is right hand dominant.    Past Medical History  Diagnosis Date  . Pancreatitis   . Hypertension   . Asthma    Past Surgical History  Procedure Laterality Date  . Cholecystectomy     Family History  Problem Relation Age of Onset  . Hypertension Mother   . Hypertension Father   . Diabetes Maternal Grandmother   . Hypertension Maternal Grandmother   . Cancer Paternal Grandmother     colon   History  Substance Use Topics  . Smoking status: Never Smoker   . Smokeless tobacco: Not on file  . Alcohol Use: No   OB History   Grav Para Term Preterm Abortions TAB SAB Ect Mult Living   0              Review of Systems A complete 10 system review of systems was obtained and all systems are negative except as noted in the HPI and PMH.    Allergies  Mucinex  Home Medications   Prior to Admission medications   Medication Sig Start Date End Date Taking? Authorizing Provider  norgestimate-ethinyl estradiol  (ORTHO-CYCLEN,SPRINTEC,PREVIFEM) 0.25-35 MG-MCG tablet Take 1 tablet by mouth daily. 04/17/12   Freddi StarrJulie N Ethier, PA-C  sertraline (ZOLOFT) 100 MG tablet Take 100 mg by mouth daily.    Historical Provider, MD   Triage Vitals: BP 125/84  Pulse 86  Temp(Src) 98.8 F (37.1 C) (Oral)  Resp 18  SpO2 98% Physical Exam  Nursing note and vitals reviewed. Constitutional: She is oriented to person, place, and time. She appears well-developed and well-nourished. No distress.  HENT:  Head: Normocephalic and atraumatic.  Mouth/Throat: Oropharynx is clear and moist.  Eyes: Conjunctivae and EOM are normal. Pupils are equal, round, and reactive to light.  Neck: Normal range of motion. Neck supple. No tracheal deviation present.  Cardiovascular: Normal rate, regular rhythm, normal heart sounds and intact distal pulses.   Pulmonary/Chest: Effort normal and breath sounds normal. No respiratory distress. She has no wheezes. She has no rales. She exhibits no tenderness.  Abdominal: Soft. Bowel sounds are normal. She exhibits no distension and no mass. There is no tenderness. There is no rebound and no guarding.  Musculoskeletal: She exhibits tenderness.  Bruising to the right fourth digit PIP on the volar aspect. No snuffbox tenderness palpation. Neurovascularly intact, excellent range of motion.  Right knee: Partial thickness abrasion, stable to  valgus and varus stress, anterior and posterior drawer normal.  Neurological: She is alert and oriented to person, place, and time.  Skin: Skin is warm and dry.  Psychiatric: She has a normal mood and affect. Her behavior is normal.    ED Course  Procedures (including critical care time) DIAGNOSTIC STUDIES: Oxygen Saturation is 98% on room air, normal by my interpretation.    COORDINATION OF CARE: 8:13 PM- Discussed treatment plan with patient at bedside and patient agreed to plan.    Labs Review Labs Reviewed - No data to display  Imaging Review No  results found.   EKG Interpretation None      MDM   Final diagnoses:  None    Filed Vitals:   06/26/13 1947 06/26/13 2233  BP: 125/84 130/86  Pulse: 86 87  Temp: 98.8 F (37.1 C) 98.7 F (37.1 C)  TempSrc: Oral Oral  Resp: 18 16  SpO2: 98% 100%    Medications - No data to display  Kristen [name] is a 28 y.o. female presenting with right finger and right knee pain with abrasion status post slip and fall. X-ray shows fracture to the volar base of the middle phalanx of the right fourth digit. Patient care placed in splint, advised rice, advised close followup with hand surgery as this is her dominant hand. Knee film is negative, patient placed in immobilizer and given crutches, asked to follow with general orthopedist if pain persists.  Evaluation does not show pathology that would require ongoing emergent intervention or inpatient treatment. Pt is hemodynamically stable and mentating appropriately. Discussed findings and plan with patient/guardian, who agrees with care plan. All questions answered. Return precautions discussed and outpatient follow up given.   Discharge Medication List as of 06/26/2013 10:11 PM    START taking these medications   Details  HYDROcodone-acetaminophen (NORCO/VICODIN) 5-325 MG per tablet Take 1-2 tablets by mouth every 6 hours as needed for pain., Print        Note: Portions of this report may have been transcribed using voice recognition software. Every effort was made to ensure accuracy; however, inadvertent computerized transcription errors may be present   I personally performed the services described in this documentation, which was scribed in my presence. The recorded information has been reviewed and is accurate.    Wynetta Emery, PA-C 06/27/13 [phone]

## 2013-06-27 NOTE — ED Provider Notes (Signed)
Medical screening examination/treatment/procedure(s) were performed by non-physician practitioner and as supervising physician I was immediately available for consultation/collaboration.   Cerria Randhawa E Cristie Mckinney, MD 06/27/13 1047 

## 2013-07-10 ENCOUNTER — Encounter (HOSPITAL_COMMUNITY): Payer: Self-pay | Admitting: Emergency Medicine

## 2013-07-10 ENCOUNTER — Emergency Department (HOSPITAL_COMMUNITY)
Admission: EM | Admit: 2013-07-10 | Discharge: 2013-07-10 | Disposition: A | Discharge status: Home / Self Care | Payer: Self-pay | Attending: Emergency Medicine | Admitting: Emergency Medicine

## 2013-07-10 ENCOUNTER — Emergency Department (HOSPITAL_COMMUNITY): Payer: Self-pay

## 2013-07-10 DIAGNOSIS — S42309S Unspecified fracture of shaft of humerus, unspecified arm, sequela: Secondary | ICD-10-CM | POA: Insufficient documentation

## 2013-07-10 DIAGNOSIS — W19XXXS Unspecified fall, sequela: Secondary | ICD-10-CM | POA: Insufficient documentation

## 2013-07-10 DIAGNOSIS — I10 Essential (primary) hypertension: Secondary | ICD-10-CM | POA: Insufficient documentation

## 2013-07-10 DIAGNOSIS — J45909 Unspecified asthma, uncomplicated: Secondary | ICD-10-CM | POA: Insufficient documentation

## 2013-07-10 DIAGNOSIS — IMO0001 Reserved for inherently not codable concepts without codable children: Secondary | ICD-10-CM | POA: Insufficient documentation

## 2013-07-10 DIAGNOSIS — M7989 Other specified soft tissue disorders: Secondary | ICD-10-CM | POA: Insufficient documentation

## 2013-07-10 DIAGNOSIS — K859 Acute pancreatitis without necrosis or infection, unspecified: Secondary | ICD-10-CM | POA: Insufficient documentation

## 2013-07-10 DIAGNOSIS — M25549 Pain in joints of unspecified hand: Secondary | ICD-10-CM | POA: Insufficient documentation

## 2013-07-10 DIAGNOSIS — S62629A Displaced fracture of medial phalanx of unspecified finger, initial encounter for closed fracture: Secondary | ICD-10-CM

## 2013-07-10 DIAGNOSIS — Z888 Allergy status to other drugs, medicaments and biological substances status: Secondary | ICD-10-CM | POA: Insufficient documentation

## 2013-07-10 DIAGNOSIS — S62609G Fracture of unspecified phalanx of unspecified finger, subsequent encounter for fracture with delayed healing: Secondary | ICD-10-CM

## 2013-07-10 NOTE — ED Notes (Signed)
Pt reports fracturing her right ring finger 2-3 weeks ago and was told to come here for re-eval. Swelling noted and reports still having mild pain.

## 2013-07-10 NOTE — Discharge Instructions (Signed)
Read the information below.  You may return to the Emergency Department at any time for worsening condition or any new symptoms that concern you.  If you develop uncontrolled pain, weakness or numbness of the extremity, severe discoloration of the skin, or you are unable to move your finger, return to the ER for a recheck.      Finger Fracture Fractures of fingers are breaks in the bones of the fingers. There are many types of fractures. There are different ways of treating these fractures. Your health care provider will discuss the best way to treat your fracture. CAUSES Traumatic injury is the main cause of broken fingers. These include:  Injuries while playing sports.  Workplace injuries.  Falls. RISK FACTORS Activities that can increase your risk of finger fractures include:  Sports.  Workplace activities that involve machinery.  A condition called osteoporosis, which can make your bones less dense and cause them to fracture more easily. SIGNS AND SYMPTOMS The main symptoms of a broken finger are pain and swelling within 15 minutes after the injury. Other symptoms include:  Bruising of your finger.  Stiffness of your finger.  Numbness of your finger.  Exposed bones (compound fracture) if the fracture is severe. DIAGNOSIS  The best way to diagnose a broken bone is with X-ray imaging. Additionally, your health care provider will use this X-ray image to evaluate the position of the broken finger bones.  TREATMENT  Finger fractures can be treated with:   Nonreduction This means the bones are in place. The finger is splinted without changing the positions of the bone pieces. The splint is usually left on for about a week to 10 days. This will depend on your fracture and what your health care provider thinks.  Closed reduction The bones are put back into position without using surgery. The finger is then splinted.  Open reduction and internal fixation The fracture site is  opened. Then the bone pieces are fixed into place with pins or some type of hardware. This is seldom required. It depends on the severity of the fracture. HOME CARE INSTRUCTIONS   Follow your health care provider's instructions regarding activities, exercises, and physical therapy.  Only take over-the-counter or prescription medicines for pain, discomfort, or fever as directed by your health care provider. SEEK MEDICAL CARE IF: You have pain or swelling that limits the motion or use of your fingers. SEEK IMMEDIATE MEDICAL CARE IF:  Your finger becomes numb. MAKE SURE YOU:   Understand these instructions.  Will watch your condition.  Will get help right away if you are not doing well or get worse. Document Released: 05/05/2000 Document Revised: 11/11/2012 Document Reviewed: 09/02/2012 [facility] Patient Information 2014 [place], [place].

## 2013-07-10 NOTE — ED Provider Notes (Signed)
CSN: [id number]     Arrival date & time 07/10/13  1119 History  This chart was scribed for Kristen Dredge PA-C working with [name], MD by [name], ED scribe. This patient was seen in room TR08C/TR08C and the patient's care was started at 12:18 PM.  First MD Initiated Contact with Patient 07/10/13 1148     Chief Complaint  Patient presents with  . Finger Injury     (Consider location/radiation/quality/duration/timing/severity/associated sxs/prior Treatment) The history is provided by the patient and medical records. No language interpreter was used.   HPI Comments: Kristen [name] is a 28 y.o. right handed female who presents to the Emergency Department complaining of fourth finger injury that occurred 06/26/13. She tripped while getting out of a car and landed on her right hand.  She was supposed to follow up with hand surgery but was unable to afford to be seen due to lack of funds.  Denies pain, numbness.  She is unable to bend at the PIP but can move the finger normally otherwise. She states the injury is improving.  Denies having a current PCP. 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     Past Medical History  Diagnosis Date  . Pancreatitis   . Hypertension   . Asthma    Past Surgical History  Procedure Laterality Date  . Cholecystectomy     Family History  Problem Relation Age of Onset  . Hypertension Mother   . Hypertension Father   . Diabetes Maternal Grandmother   . Hypertension Maternal Grandmother   . Cancer Paternal Grandmother     colon   History  Substance Use Topics  . Smoking status: Never Smoker   . Smokeless tobacco: Not on file  . Alcohol Use: No   OB History   Grav Para Term Preterm Abortions TAB SAB Ect Mult Living   0              Review of Systems  Musculoskeletal: Positive for arthralgias and myalgias. Negative for joint swelling.  All other systems reviewed and are negative.     Allergies  Mucinex  Home Medications   Prior  to Admission medications   Medication Sig Start Date End Date Taking? Authorizing Provider  HYDROcodone-acetaminophen (NORCO/VICODIN) 5-325 MG per tablet Take 1-2 tablets by mouth every 6 hours as needed for pain. 06/26/13   Nicole Pisciotta, PA-C  sertraline (ZOLOFT) 100 MG tablet Take 100 mg by mouth daily.    Historical Provider, MD   BP 129/53  Pulse 62  Temp(Src) 98.1 F (36.7 C) (Oral)  Resp 18  Wt 297 lb (134.718 kg)  SpO2 95%  LMP 05/19/2013 Physical Exam  Nursing note and vitals reviewed. Constitutional: She appears well-developed and well-nourished. No distress.  HENT:  Head: Normocephalic and atraumatic.  Neck: Neck supple.  Cardiovascular:   Capillary refill less than 2 seconds.   Pulmonary/Chest: Effort normal.  Musculoskeletal: She exhibits edema. She exhibits no tenderness.  Right fourth finger edema and ecchymosis over the proximal phalanx and PIP.  Full rom of DIP and MCP Finger non tender.     Neurological: She is alert.  Sensation intact   Skin: She is not diaphoretic.    ED Course  Procedures (including critical care time) DIAGNOSTIC STUDIES: Oxygen Saturation is 95% on room air, normal by my interpretation.    COORDINATION OF CARE:  12:22 PM Discussed course of care with pt which includes right ringer finger x-ray. Pt understands and agrees. 2:41  PM Reviewed x-ray with Dr Denton LankSteinl.    Labs Review Labs Reviewed - No data to display  Imaging Review Dg Finger Ring Right  07/10/2013   CLINICAL DATA:  Pain post trauma  EXAM: RIGHT FOURTH FINGER 2+V  COMPARISON:  Jun 26, 2013  FINDINGS: Frontal, oblique, and lateral views were obtained. There is again noted and obliquely oriented fracture along the volar aspect of the proximal most aspect of the fourth middle phalanx. There is slight subluxation at the PIP joint with the middle and distal phalanges subluxed slightly dorsal to the proximal phalanx. There is soft tissue swelling in this area. No new fracture.  No frank dislocation. Other joint spaces appear intact.  IMPRESSION: Fracture again noted along the volar aspect of the proximal portion of the fourth middle phalanx without appreciable interval healing. Mild subluxation at the fourth PIP joint. No frank dislocation. No new fracture.   Electronically Signed   By: Bretta BangWilliam  Woodruff M.D.   On: 07/10/2013 14:09     EKG Interpretation None     Reviewed xray with Dr Denton LankSteinl. Pt to follow up with hand surgery.      MDM   Final diagnoses:  Fracture of finger, middle phalanx, closed  Fracture subluxation of finger with delayed healing    Pt with fracture of 4th finger with subluxation.  Injury occurred 06/26/13.  She did not follow up with hand surgery as recommended.  Neurovascularly intact.  No new injury.  Pt notes overall improvement, pain has resolved.  She cannot bend at PIP.  Will need to follow up with hand surgery.  Discussed result, findings, treatment, and follow up  with patient.  Pt given return precautions.  Pt verbalizes understanding and agrees with plan.      I personally performed the services described in this documentation, which was scribed in my presence. The recorded information has been reviewed and is accurate.    Kristen Dredgemily Theran Vandergrift, PA-C 07/10/13 1515

## 2013-07-10 NOTE — ED Provider Notes (Signed)
Medical screening examination/treatment/procedure(s) were performed by non-physician practitioner and as supervising physician I was immediately available for consultation/collaboration.     [name], MD 07/10/13 1640

## 2013-07-10 NOTE — ED Notes (Signed)
Patient declined wheelchair at discharge.  RN escorted patient to lobby.  

## 2013-07-10 NOTE — ED Notes (Signed)
Called xray, 2 patients in front.  Pt updated.

## 2013-07-21 ENCOUNTER — Other Ambulatory Visit: Payer: Self-pay | Admitting: Orthopedic Surgery

## 2013-07-21 ENCOUNTER — Encounter (HOSPITAL_BASED_OUTPATIENT_CLINIC_OR_DEPARTMENT_OTHER): Payer: Self-pay | Admitting: *Deleted

## 2013-07-21 NOTE — H&P (Signed)
Kristen [name] is an 28 y.o. female.   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  She reports she was evaluated in the emergency department, x-rays obtained, and a splint applied.  She was instructed to seek followup with the hand surgeon on call that day.  The patient was unable to successfully get an appointment, and returned to the emergency department on 07-10-13.  Her problem was again recognized and she was referred to our office because I was on call that day.  She reports some continued pain and swelling and difficulty with motion.  Past Medical History  Diagnosis Date  . Pancreatitis   . Hypertension   . Asthma   . Dysrhythmia     PVC's  . Depression   . Anxiety     Past Surgical History  Procedure Laterality Date  . Cholecystectomy      Family History  Problem Relation Age of Onset  . Hypertension Mother   . Hypertension Father   . Diabetes Maternal Grandmother   . Hypertension Maternal Grandmother   . Cancer Paternal Grandmother     colon   Social History:  reports that she has never smoked. She does not have any smokeless tobacco history on file. She reports that she does not drink alcohol or use illicit drugs.  Allergies:  Allergies  Allergen Reactions  . Mucinex [Guaifenesin Er] Swelling    No prescriptions prior to admission    No results found for this or any previous visit (from the past 48 hour(s)). No results found.  Review of Systems  All other systems reviewed and are negative.   Height 5\' 7"  (1.702 m), weight 137.44 kg (303 lb), last menstrual period 07/10/2013. Physical Exam  Constitutional:  WD, WN, NAD HEENT:  NCAT, EOMI Neuro/Psych:  Alert & oriented to person, place, and  time; appropriate mood & affect Lymphatic: No generalized UE edema or lymphadenopathy Extremities / MSK:  Both UE are normal with respect to appearance, ranges of motion, joint stability, muscle strength/tone, sensation, & perfusion except as otherwise noted:  The right ring finger is a little swollen at the PIP joint.  She has a range from 0-45, but good MP motion.  Flexor and extensor tendons appear intact and the digit is not deviated.  Labs / Xrays:  No radiographic studies obtained today.  X-rays from both emergency room visits are reviewed and reveal a classic dorsal fracture-subluxation of the PIP joint, with volar impaction and comminution of the base of the middle phalanx.  Assessment: Right ring finger dorsal fracture subluxation of the PIP joint  Plan:  I discussed these findings with the patient and her mother.  I indicated without serious this injury can be and how difficult it is to achieve optimal outcomes, even when treated promptly.  I discussed multiple methods for achieving the objectives of obtaining a reduced joint, reconstructing a contiguous and congruent articular surface, and allowing for early motion to minimize stiffness.  Given the degree of comminution, and the time that has passed, I suspect that her best option at this point is to proceed with open treatment, likely with volar plate arthroplasty, either supplemented with a force couple splint or cross pinning of the PIP joint.  This will be an intraoperative decision based upon multiple factors.  I spent a long time discussing this and drawing pictures to assist an explanation.  She understands that real risks to this injury include the development of posttraumatic arthritis and loss of motion.    The details of the operative procedure were discussed with the patient.  Questions were invited and answered.  In addition to the goal of the procedure, the risks of the procedure to include but not limited to bleeding;  infection; damage to the nerves or blood vessels that could result in bleeding, numbness, weakness, chronic pain, and the need for additional procedures; stiffness; the need for revision surgery; and anesthetic risks, the worst of which is death, were reviewed.  No specific outcome was guaranteed or implied.  Informed consent was obtained.   THOMPSON, DAVID A. 07/21/2013, 6:55 PM

## 2013-07-23 ENCOUNTER — Encounter (HOSPITAL_BASED_OUTPATIENT_CLINIC_OR_DEPARTMENT_OTHER)
Admission: RE | Disposition: A | Discharge status: Home / Self Care | Payer: Self-pay | Source: Ambulatory Visit | Attending: Orthopedic Surgery

## 2013-07-23 ENCOUNTER — Ambulatory Visit (HOSPITAL_BASED_OUTPATIENT_CLINIC_OR_DEPARTMENT_OTHER)
Admission: RE | Admit: 2013-07-23 | Discharge: 2013-07-23 | Disposition: A | Discharge status: Home / Self Care | Payer: Self-pay | Source: Ambulatory Visit | Attending: Orthopedic Surgery | Admitting: Orthopedic Surgery

## 2013-07-23 ENCOUNTER — Ambulatory Visit (HOSPITAL_COMMUNITY): Payer: Self-pay

## 2013-07-23 ENCOUNTER — Encounter (HOSPITAL_BASED_OUTPATIENT_CLINIC_OR_DEPARTMENT_OTHER): Payer: Self-pay | Admitting: Certified Registered"

## 2013-07-23 ENCOUNTER — Ambulatory Visit (HOSPITAL_BASED_OUTPATIENT_CLINIC_OR_DEPARTMENT_OTHER): Payer: Self-pay | Admitting: Certified Registered"

## 2013-07-23 DIAGNOSIS — Z79899 Other long term (current) drug therapy: Secondary | ICD-10-CM | POA: Insufficient documentation

## 2013-07-23 DIAGNOSIS — J45909 Unspecified asthma, uncomplicated: Secondary | ICD-10-CM | POA: Insufficient documentation

## 2013-07-23 DIAGNOSIS — I10 Essential (primary) hypertension: Secondary | ICD-10-CM | POA: Insufficient documentation

## 2013-07-23 DIAGNOSIS — W19XXXA Unspecified fall, initial encounter: Secondary | ICD-10-CM | POA: Insufficient documentation

## 2013-07-23 DIAGNOSIS — IMO0002 Reserved for concepts with insufficient information to code with codable children: Secondary | ICD-10-CM | POA: Insufficient documentation

## 2013-07-23 HISTORY — DX: Anxiety disorder, unspecified: F41.9

## 2013-07-23 HISTORY — DX: Major depressive disorder, single episode, unspecified: F32.9

## 2013-07-23 HISTORY — PX: OPEN REDUCTION INTERNAL FIXATION (ORIF) PROXIMAL PHALANX: SHX6235

## 2013-07-23 HISTORY — DX: Cardiac arrhythmia, unspecified: I49.9

## 2013-07-23 LAB — POCT HEMOGLOBIN-HEMACUE: Hemoglobin: 12.8 g/dL (ref 12.0–15.0)

## 2013-07-23 SURGERY — OPEN REDUCTION INTERNAL FIXATION (ORIF) PROXIMAL PHALANX
Anesthesia: General | Site: Finger | Laterality: Right

## 2013-07-23 MED ORDER — CHLORHEXIDINE GLUCONATE 4 % EX LIQD
60.0000 mL | Freq: Once | CUTANEOUS | Status: DC
Start: 2013-07-23 — End: 2013-07-23

## 2013-07-23 MED ORDER — OXYCODONE HCL 5 MG PO TABS
5.0000 mg | ORAL_TABLET | Freq: Once | ORAL | Status: AC | PRN
  Administered 2013-07-23: 5 mg via ORAL

## 2013-07-23 MED ORDER — MEPERIDINE HCL 25 MG/ML IJ SOLN: 6.2500 mg | INTRAMUSCULAR | Status: DC | PRN

## 2013-07-23 MED ORDER — PROPOFOL 10 MG/ML IV BOLUS
INTRAVENOUS | Status: DC | PRN
  Administered 2013-07-23: 200 mg via INTRAVENOUS

## 2013-07-23 MED ORDER — MIDAZOLAM HCL 2 MG/2ML IJ SOLN
INTRAMUSCULAR | Status: AC
  Filled 2013-07-23: qty 2

## 2013-07-23 MED ORDER — OXYCODONE HCL 5 MG PO TABS
ORAL_TABLET | ORAL | Status: AC
  Filled 2013-07-23: qty 1

## 2013-07-23 MED ORDER — DEXTROSE 5 % IV SOLN
3.0000 g | INTRAVENOUS | Status: AC
  Administered 2013-07-23: 3 g via INTRAVENOUS

## 2013-07-23 MED ORDER — FENTANYL CITRATE 0.05 MG/ML IJ SOLN
INTRAMUSCULAR | Status: DC | PRN
  Administered 2013-07-23: 100 ug via INTRAVENOUS
  Administered 2013-07-23: 25 ug via INTRAVENOUS

## 2013-07-23 MED ORDER — HYDROMORPHONE HCL PF 1 MG/ML IJ SOLN
INTRAMUSCULAR | Status: AC
  Filled 2013-07-23: qty 1

## 2013-07-23 MED ORDER — LACTATED RINGERS IV SOLN
INTRAVENOUS | Status: DC
  Administered 2013-07-23 (×2): via INTRAVENOUS

## 2013-07-23 MED ORDER — DEXAMETHASONE SODIUM PHOSPHATE 10 MG/ML IJ SOLN
INTRAMUSCULAR | Status: DC | PRN
  Administered 2013-07-23: 10 mg via INTRAVENOUS

## 2013-07-23 MED ORDER — ONDANSETRON HCL 4 MG/2ML IJ SOLN
INTRAMUSCULAR | Status: DC | PRN
  Administered 2013-07-23: 4 mg via INTRAVENOUS

## 2013-07-23 MED ORDER — CEFAZOLIN SODIUM 1-5 GM-% IV SOLN
INTRAVENOUS | Status: AC
  Filled 2013-07-23: qty 50

## 2013-07-23 MED ORDER — HYDROMORPHONE HCL PF 1 MG/ML IJ SOLN
0.2500 mg | INTRAMUSCULAR | Status: DC | PRN
  Administered 2013-07-23: 0.5 mg via INTRAVENOUS

## 2013-07-23 MED ORDER — MIDAZOLAM HCL 5 MG/5ML IJ SOLN
INTRAMUSCULAR | Status: DC | PRN
  Administered 2013-07-23: 2 mg via INTRAVENOUS

## 2013-07-23 MED ORDER — LACTATED RINGERS IV SOLN: INTRAVENOUS | Status: DC

## 2013-07-23 MED ORDER — LIDOCAINE HCL (CARDIAC) 20 MG/ML IV SOLN
INTRAVENOUS | Status: DC | PRN
  Administered 2013-07-23: 100 mg via INTRAVENOUS

## 2013-07-23 MED ORDER — FENTANYL CITRATE 0.05 MG/ML IJ SOLN
INTRAMUSCULAR | Status: AC
Start: 2013-07-23 — End: 2013-07-23
  Filled 2013-07-23: qty 6

## 2013-07-23 MED ORDER — OXYCODONE-ACETAMINOPHEN 5-325 MG PO TABS: 1.0000 | ORAL_TABLET | ORAL | Status: DC | PRN

## 2013-07-23 MED ORDER — ONDANSETRON HCL 4 MG/2ML IJ SOLN
4.0000 mg | Freq: Once | INTRAMUSCULAR | Status: DC | PRN
Start: 2013-07-23 — End: 2013-07-23

## 2013-07-23 MED ORDER — OXYCODONE HCL 5 MG/5ML PO SOLN
5.0000 mg | Freq: Once | ORAL | Status: AC | PRN
Start: 2013-07-23 — End: 2013-07-23

## 2013-07-23 MED ORDER — BUPIVACAINE-EPINEPHRINE 0.5% -1:200000 IJ SOLN
INTRAMUSCULAR | Status: DC | PRN
  Administered 2013-07-23: 10 mL

## 2013-07-23 MED ORDER — FENTANYL CITRATE 0.05 MG/ML IJ SOLN: 50.0000 ug | INTRAMUSCULAR | Status: DC | PRN

## 2013-07-23 MED ORDER — MIDAZOLAM HCL 2 MG/2ML IJ SOLN: 1.0000 mg | INTRAMUSCULAR | Status: DC | PRN

## 2013-07-23 SURGICAL SUPPLY — 46 items
BANDAGE COBAN STERILE 2 (GAUZE/BANDAGES/DRESSINGS) IMPLANT
BLADE MINI RND TIP GREEN BEAV (BLADE) IMPLANT
BLADE SURG 15 STRL LF DISP TIS (BLADE) ×1 IMPLANT
BLADE SURG 15 STRL SS (BLADE) ×3
BNDG CMPR 9X4 STRL LF SNTH (GAUZE/BANDAGES/DRESSINGS) ×1
BNDG COHESIVE 4X5 TAN STRL (GAUZE/BANDAGES/DRESSINGS) ×3 IMPLANT
BNDG ESMARK 4X9 LF (GAUZE/BANDAGES/DRESSINGS) ×2 IMPLANT
BNDG GAUZE ELAST 4 BULKY (GAUZE/BANDAGES/DRESSINGS) ×6 IMPLANT
CHLORAPREP W/TINT 26ML (MISCELLANEOUS) ×3 IMPLANT
COVER MAYO STAND STRL (DRAPES) ×3 IMPLANT
COVER TABLE BACK 60X90 (DRAPES) ×3 IMPLANT
CUFF TOURNIQUET SINGLE 18IN (TOURNIQUET CUFF) ×2 IMPLANT
DRAPE C-ARM 42X72 X-RAY (DRAPES) ×3 IMPLANT
DRAPE EXTREMITY T 121X128X90 (DRAPE) ×3 IMPLANT
DRAPE SURG 17X23 STRL (DRAPES) ×3 IMPLANT
DRSG EMULSION OIL 3X3 NADH (GAUZE/BANDAGES/DRESSINGS) ×3 IMPLANT
GAUZE SPONGE 4X4 12PLY STRL (GAUZE/BANDAGES/DRESSINGS) ×3 IMPLANT
GLOVE BIO SURGEON STRL SZ 6.5 (GLOVE) ×1 IMPLANT
GLOVE BIO SURGEON STRL SZ7.5 (GLOVE) ×3 IMPLANT
GLOVE BIO SURGEONS STRL SZ 6.5 (GLOVE) ×1
GLOVE BIOGEL PI IND STRL 7.0 (GLOVE) IMPLANT
GLOVE BIOGEL PI IND STRL 8 (GLOVE) ×1 IMPLANT
GLOVE BIOGEL PI INDICATOR 7.0 (GLOVE) ×2
GLOVE BIOGEL PI INDICATOR 8 (GLOVE) ×2
GOWN STRL REUS W/ TWL LRG LVL3 (GOWN DISPOSABLE) ×2 IMPLANT
GOWN STRL REUS W/TWL LRG LVL3 (GOWN DISPOSABLE) ×6
K-WIRE .045X4 (WIRE) ×2 IMPLANT
NDL HYPO 25X1 1.5 SAFETY (NEEDLE) IMPLANT
NDL KEITH SZ10 STRAIGHT (NEEDLE) IMPLANT
NEEDLE HYPO 25X1 1.5 SAFETY (NEEDLE) IMPLANT
NEEDLE KEITH SZ10 STRAIGHT (NEEDLE) ×3 IMPLANT
NS IRRIG 1000ML POUR BTL (IV SOLUTION) ×3 IMPLANT
PACK BASIN DAY SURGERY FS (CUSTOM PROCEDURE TRAY) ×3 IMPLANT
PADDING CAST ABS 4INX4YD NS (CAST SUPPLIES) ×2
PADDING CAST ABS COTTON 4X4 ST (CAST SUPPLIES) IMPLANT
RUBBERBAND STERILE (MISCELLANEOUS) ×3 IMPLANT
STOCKINETTE 4X48 STRL (DRAPES) ×3 IMPLANT
SUT FIBERWIRE 4-0 18 TAPR NDL (SUTURE) ×6
SUT VICRYL RAPIDE 4-0 (SUTURE) IMPLANT
SUT VICRYL RAPIDE 4/0 PS 2 (SUTURE) ×2 IMPLANT
SUTURE FIBERWR 4-0 18 TAPR NDL (SUTURE) IMPLANT
SYR BULB 3OZ (MISCELLANEOUS) ×2 IMPLANT
SYRINGE 10CC LL (SYRINGE) IMPLANT
TOWEL OR 17X24 6PK STRL BLUE (TOWEL DISPOSABLE) ×3 IMPLANT
TOWEL OR NON WOVEN STRL DISP B (DISPOSABLE) ×3 IMPLANT
UNDERPAD 30X30 INCONTINENT (UNDERPADS AND DIAPERS) ×1 IMPLANT

## 2013-07-23 NOTE — Anesthesia Postprocedure Evaluation (Signed)
Anesthesia Post Note  Patient: Kristen [name]  Procedure(s) Performed: Procedure(s) (LRB): OPEN TREATMENT RIGHT RING FINGER, PROXIMAL INTERPHALANGEAL/JOINT FRACTURE DISLOCATION (Right)  Anesthesia type: general  Patient location: PACU  Post pain: Pain level controlled  Post assessment: Patient's Cardiovascular Status Stable  Last Vitals:  Filed Vitals:   07/23/13 1700  BP: 143/103  Pulse: 75  Temp:   Resp: 11    Post vital signs: Reviewed and stable  Level of consciousness: sedated  Complications: No apparent anesthesia complications

## 2013-07-23 NOTE — Anesthesia Procedure Notes (Signed)
Procedure Name: LMA Insertion Date/Time: 07/23/2013 3:34 PM Performed by: Curly ShoresRAFT, JANET W Pre-anesthesia Checklist: Patient identified, Emergency Drugs available, Suction available and Patient being monitored Patient Re-evaluated:Patient Re-evaluated prior to inductionOxygen Delivery Method: Circle System Utilized Preoxygenation: Pre-oxygenation with 100% oxygen Intubation Type: IV induction Ventilation: Mask ventilation without difficulty LMA: LMA inserted LMA Size: 4.0 Number of attempts: 1 Airway Equipment and Method: bite block Placement Confirmation: positive ETCO2 and breath sounds checked- equal and bilateral Tube secured with: Tape Dental Injury: Teeth and Oropharynx as per pre-operative assessment

## 2013-07-23 NOTE — Op Note (Signed)
07/23/2013  3:22 PM  PATIENT:  Hildred LaserStephanie Y Morain  28 y.o. female  PRE-OPERATIVE DIAGNOSIS:  334-week-old right ring finger PIP fracture subluxation  POST-OPERATIVE DIAGNOSIS:  Same  PROCEDURE:  Open treatment of right ring finger PIP fracture subluxation with volar plate arthroplasty and pinning of the joint in a reduced position  SURGEON: Cliffton Astersavid A. Janee Mornhompson, MD  PHYSICIAN ASSISTANT: None  ANESTHESIA:  general  SPECIMENS:  None  DRAINS:   None  PREOPERATIVE INDICATIONS:  Hildred LaserStephanie Y Lovelady is a  28 y.o. female with 494-week-old closed right ring finger PIP fracture subluxation.  The risks benefits and alternatives were discussed with the patient preoperatively including but not limited to the risks of infection, bleeding, nerve injury, cardiopulmonary complications, the need for revision surgery, among others, and the patient verbalized understanding and consented to proceed.  OPERATIVE IMPLANTS: 0.045 inch K wire x1  OPERATIVE PROCEDURE:  After receiving prophylactic antibiotics, the patient was escorted to the operative theatre and placed in a supine position.  General anesthesia was administered A surgical "time-out" was performed during which the planned procedure, proposed operative site, and the correct patient identity were compared to the operative consent and agreement confirmed by the circulating nurse according to current facility policy.  Following application of a tourniquet to the operative extremity, the exposed skin was prepped with Chloraprep and draped in the usual sterile fashion.  The limb was exsanguinated with an Esmarch bandage and the tourniquet inflated to approximately 100mmHg higher than systolic BP.  The joint was evaluated fluoroscopically and found to be passively reducible. A 2 limb zigzag incision was then made on the volar surface of the ring finger centered at the PIP joint. The flaps were elevated and retracted. The flexor tendon sheath was opened between A2  and A4 pulleys.  The flexor tendons were retracted and the volar plate was incised distally on its medial and lateral edges. In this manner, knife blade could be inserted into the joint and the volar plate could be removed from the volar fragment. A small volar rim fragment was excised. The bed was roughened with a curette. Some articular margin at that level was disimpacted slightly. 40 FiberWire was then placed running up-and-down each side of the volar plate so that there were 2 tails together on the ulnar side, and 2 on the radial side. They were placed with grasping bites similar to a Krakw stitch. Mellody DanceKeith needle was used on power, from the volar base of the middle phalanx to a more midline and dorsal location. This was done on both the radial and ulnar side of the base of the middle phalanx in the [place] needles were passed exiting through a small dorsal incision, each time bringing the tails of the suture placed in the volar plate with it so that it could be tied over a dorsal bone bridge. The joint was then manually reduced and a 0.045 inch K wire driven across the joint with it reduced. Images confirm this. The sutures on the volar plate were then tied dorsally over the bone bridge and cut. Tourniquet was released final fluoroscopic images obtained and the wound is copiously irrigated. Additional hemostasis was obtained with bipolar electrocautery and half percent Marcaine with epinephrine was instilled in the base of the digit as a digital block. The skin was all closed with 4-0 Vicryl Rapide interrupted sutures and a bulky splint dressing was applied with the MP joints flexed, wrist slightly extended.  DISPOSITION: The patient will be discharged home today  with typical instructions. We will call her to arrange followup. At that time she should have x-rays of the right ring finger out of the splint. We will likely keep the pin for 3-4 weeks before pulling and beginning more motion at the PIP  joint.

## 2013-07-23 NOTE — Anesthesia Preprocedure Evaluation (Signed)
Anesthesia Evaluation  Patient identified by MRN, date of birth, ID band Patient awake    Reviewed: Allergy & Precautions, H&P , NPO status , Patient's Chart, lab work & pertinent test results  Airway Mallampati: I TM Distance: >3 FB Neck ROM: Full    Dental   Pulmonary asthma ,          Cardiovascular hypertension, Pt. on medications     Neuro/Psych    GI/Hepatic   Endo/Other    Renal/GU      Musculoskeletal   Abdominal   Peds  Hematology   Anesthesia Other Findings   Reproductive/Obstetrics                           Anesthesia Physical Anesthesia Plan  ASA: III  Anesthesia Plan: General   Post-op Pain Management:    Induction: Intravenous  Airway Management Planned: LMA  Additional Equipment:   Intra-op Plan:   Post-operative Plan: Extubation in OR  Informed Consent: I have reviewed the patients History and Physical, chart, labs and discussed the procedure including the risks, benefits and alternatives for the proposed anesthesia with the patient or authorized representative who has indicated his/her understanding and acceptance.     Plan Discussed with: CRNA and Surgeon  Anesthesia Plan Comments:         Anesthesia Quick Evaluation

## 2013-07-23 NOTE — Interval H&P Note (Signed)
History and Physical Interval Note:  07/23/2013 3:22 PM  Kristen LaserStephanie Y [name]  has presented today for surgery, with the diagnosis of RIGHT RING FINGER FRACTURE/DISLOCATION  The various methods of treatment have been discussed with the patient and family. After consideration of risks, benefits and other options for treatment, the patient has consented to  Procedure(s): OPEN TREATMENT RIGHT RING FINGER, PROXIMAL INTERPHALANGEAL/JOINT FRACTURE DISLOCATION (Right) as a surgical intervention .  The patient's history has been reviewed, patient examined, no change in status, stable for surgery.  I have reviewed the patient's chart and labs.  Questions were answered to the patient's satisfaction.     THOMPSON, DAVID A.

## 2013-07-23 NOTE — Transfer of Care (Signed)
Immediate Anesthesia Transfer of Care Note  Patient: Kristen [name]  Procedure(s) Performed: Procedure(s): OPEN TREATMENT RIGHT RING FINGER, PROXIMAL INTERPHALANGEAL/JOINT FRACTURE DISLOCATION (Right)  Patient Location: PACU  Anesthesia Type:General  Level of Consciousness: awake, alert , oriented and patient cooperative  Airway & Oxygen Therapy: Patient Spontanous Breathing and Patient connected to face mask oxygen  Post-op Assessment: Report given to PACU RN and Post -op Vital signs reviewed and stable  Post vital signs: Reviewed and stable  Complications: No apparent anesthesia complications

## 2013-07-23 NOTE — Discharge Instructions (Signed)
Discharge Instructions ° ° °You have a dressing with a plaster splint incorporated in it. °Move your fingers as much as possible, making a full fist and fully opening the fist. °Elevate your hand to reduce pain & swelling of the digits.  Ice over the operative site may be helpful to reduce pain & swelling.  DO NOT USE HEAT. °Pain medicine has been prescribed for you.  °Use your medicine as needed over the first 48 hours, and then you can begin to taper your use.  You may use Tylenol in place of your prescribed pain medication, but not IN ADDITION to it. °Leave the dressing in place until you return to our office.  °You may shower, but keep the bandage clean & dry.  °You may drive a car when you are off of prescription pain medications and can safely control your vehicle with both hands. °Our office will call you to arrange follow-up ° ° °Please call [phone] during normal business hours or [phone] after hours for any problems. Including the following: ° °- excessive redness of the incisions °- drainage for more than 4 days °- fever of more than 101.5 F ° °*Please note that pain medications will not be refilled after hours or on weekends. ° ° °Post Anesthesia Home Care Instructions ° °Activity: °Get plenty of rest for the remainder of the day. A responsible adult should stay with you for 24 hours following the procedure.  °For the next 24 hours, DO NOT: °-Drive a car °-Operate machinery °-Drink alcoholic beverages °-Take any medication unless instructed by your physician °-Make any legal decisions or sign important papers. ° °Meals: °Start with liquid foods such as gelatin or soup. Progress to regular foods as tolerated. Avoid greasy, spicy, heavy foods. If nausea and/or vomiting occur, drink only clear liquids until the nausea and/or vomiting subsides. Call your physician if vomiting continues. ° °Special Instructions/Symptoms: °Your throat may feel dry or sore from the anesthesia or the breathing tube  placed in your throat during surgery. If this causes discomfort, gargle with warm salt water. The discomfort should disappear within 24 hours. ° °

## 2013-07-26 ENCOUNTER — Encounter (HOSPITAL_BASED_OUTPATIENT_CLINIC_OR_DEPARTMENT_OTHER): Payer: Self-pay | Admitting: Orthopedic Surgery

## 2013-07-30 ENCOUNTER — Ambulatory Visit: Payer: Self-pay | Attending: Internal Medicine

## 2014-01-30 ENCOUNTER — Encounter (HOSPITAL_COMMUNITY): Payer: Self-pay | Admitting: Emergency Medicine

## 2014-01-30 ENCOUNTER — Emergency Department (HOSPITAL_COMMUNITY)
Admission: EM | Admit: 2014-01-30 | Discharge: 2014-01-31 | Disposition: A | Discharge status: Home / Self Care | Payer: Self-pay | Attending: Emergency Medicine | Admitting: Emergency Medicine

## 2014-01-30 DIAGNOSIS — H6092 Unspecified otitis externa, left ear: Secondary | ICD-10-CM | POA: Insufficient documentation

## 2014-01-30 DIAGNOSIS — I10 Essential (primary) hypertension: Secondary | ICD-10-CM | POA: Insufficient documentation

## 2014-01-30 DIAGNOSIS — F419 Anxiety disorder, unspecified: Secondary | ICD-10-CM | POA: Insufficient documentation

## 2014-01-30 DIAGNOSIS — Z8719 Personal history of other diseases of the digestive system: Secondary | ICD-10-CM | POA: Insufficient documentation

## 2014-01-30 DIAGNOSIS — Z79899 Other long term (current) drug therapy: Secondary | ICD-10-CM | POA: Insufficient documentation

## 2014-01-30 DIAGNOSIS — J45909 Unspecified asthma, uncomplicated: Secondary | ICD-10-CM | POA: Insufficient documentation

## 2014-01-30 DIAGNOSIS — F329 Major depressive disorder, single episode, unspecified: Secondary | ICD-10-CM | POA: Insufficient documentation

## 2014-01-30 MED ORDER — OFLOXACIN 0.3 % OT SOLN: 10.0000 [drp] | Freq: Every day | OTIC | Status: DC

## 2014-01-30 MED ORDER — ANTIPYRINE-BENZOCAINE 5.4-1.4 % OT SOLN: 3.0000 [drp] | OTIC | Status: DC | PRN

## 2014-01-30 NOTE — Discharge Instructions (Signed)
Please follow the directions provided.  Be sure to follow-up with the primary care provider to ensure your ear is getting better.  A referral for the Ear, Nose and Throat doctor in case your ear pain becomes worse.  Use the auralgan drops for pain and the antibiotic drops for th infection in your ear canal.  Don't hesitate to return for any new, worsening or concerning symptoms.    SEEK MEDICAL CARE IF:  You have a fever.  Your ear is still red, swollen, painful, or draining pus after 3 days.  Your redness, swelling, or pain gets worse.  You have a severe headache.  You have redness, swelling, pain, or tenderness in the area behind your ear.

## 2014-01-30 NOTE — ED Notes (Signed)
The patient said she was itching in her ear and she scratched it and now it is hurting her.  She rates herr pain 8/10.  She denies any drainage but she says she might have scratched the inside with her nails when she was scratching it last night.

## 2014-01-30 NOTE — ED Notes (Signed)
Pt c/o ear pain in left ear, onset last pm.  Pt st's worse today.  St's is prone to simmers ear but has not had it in a long time

## 2014-01-30 NOTE — ED Provider Notes (Signed)
CSN: [id number]     Arrival date & time 01/30/14  2046 History   First MD Initiated Contact with Patient 01/30/14 2242     Chief Complaint  Patient presents with  . Otalgia    The patient said she was itching in her ear and she scratched it and now it is hurting her.   (Consider location/radiation/quality/duration/timing/severity/associated sxs/prior Treatment) HPI Kristen [name] is a 28 yo female presenting with report of ear pain x 1 day.  She reports she noticed it was itching last night, she scratched the canal with her finger nails, but has noticed pain since.  The pain has been constant and rates it 8-9/10.  She denies any fevers, chills, dental pain, TMJ pain, neck pain or stiffness or mastoid pain.    Past Medical History  Diagnosis Date  . Pancreatitis   . Hypertension   . Asthma   . Dysrhythmia     PVC's  . Depression   . Anxiety    Past Surgical History  Procedure Laterality Date  . Cholecystectomy    . Open reduction internal fixation (orif) proximal phalanx Right 07/23/2013    Procedure: OPEN TREATMENT RIGHT RING FINGER, PROXIMAL INTERPHALANGEAL/JOINT FRACTURE DISLOCATION;  Surgeon: Jodi Marbleavid A Thompson, MD;  Location: [facility];  Service: Orthopedics;  Laterality: Right;   Family History  Problem Relation Age of Onset  . Hypertension Mother   . Hypertension Father   . Diabetes Maternal Grandmother   . Hypertension Maternal Grandmother   . Cancer Paternal Grandmother     colon   History  Substance Use Topics  . Smoking status: Never Smoker   . Smokeless tobacco: Not on file  . Alcohol Use: No   OB History    Gravida Para Term Preterm AB TAB SAB Ectopic Multiple Living   0              Review of Systems  Constitutional: Negative for fever and chills.  HENT: Positive for ear pain. Negative for dental problem.   Respiratory: Negative for shortness of breath.   Cardiovascular: Negative for chest pain.  Gastrointestinal: Negative for nausea  and vomiting.  Genitourinary: Negative for dysuria.  Musculoskeletal: Negative for myalgias.  Skin: Negative for rash.  Neurological: Negative for headaches.    Allergies  Mucinex  Home Medications   Prior to Admission medications   Medication Sig Start Date End Date Taking? Authorizing Provider  albuterol (PROVENTIL HFA;VENTOLIN HFA) 108 (90 BASE) MCG/ACT inhaler Inhale into the lungs every 6 (six) hours as needed for wheezing or shortness of breath.    Historical Provider, MD  HYDROcodone-acetaminophen (NORCO/VICODIN) 5-325 MG per tablet Take 1-2 tablets by mouth every 6 hours as needed for pain. 06/26/13   Nicole Pisciotta, PA-C  oxyCODONE-acetaminophen (ROXICET) 5-325 MG per tablet Take 1-2 tablets by mouth every 4 (four) hours as needed (then, beginning on 07-28-13 max 6 daily). 07/23/13   Jodi Marbleavid A Thompson, MD  sertraline (ZOLOFT) 100 MG tablet Take 100 mg by mouth daily.    Historical Provider, MD   BP 118/84 mmHg  Pulse 96  Temp(Src) 98.4 F (36.9 C) (Oral)  Resp 22  Ht 5' 7.5" (1.715 m)  Wt 310 lb (140.615 kg)  BMI 47.81 kg/m2  SpO2 98%  LMP 01/16/2014 Physical Exam  Constitutional: She is oriented to person, place, and time. She appears well-developed and well-nourished. No distress.  HENT:  Head: Normocephalic and atraumatic.  Right Ear: Tympanic membrane and external ear normal.  Left  Ear: Tympanic membrane and external ear normal.  Mouth/Throat: Oropharynx is clear and moist. No oropharyngeal exudate.  Redness, swelling to left canal    Eyes: Conjunctivae are normal.  Neck: Neck supple. No thyromegaly present.  Cardiovascular: Normal rate, regular rhythm and intact distal pulses.   Pulmonary/Chest: Effort normal and breath sounds normal. No respiratory distress.  Abdominal: Soft. There is no tenderness.  Musculoskeletal: She exhibits no tenderness.  Lymphadenopathy:    She has no cervical adenopathy.  Neurological: She is alert and oriented to person, place,  and time.  Skin: Skin is warm and dry. No rash noted. She is not diaphoretic.  Psychiatric: She has a normal mood and affect.  Nursing note and vitals reviewed.   ED Course  Procedures (including critical care time) Labs Review Labs Reviewed - No data to display  Imaging Review No results found.   EKG Interpretation None      MDM   Final diagnoses:  Otitis externa, left    28 yo with redness, swelling and pain to left ear canal, no objective findings to external ear.  No canal occlusion, and exam non concerning for mastoiditis, cellulitis or malignant OE. Discharged with prescription for ofloxacin and auralgan. Referral provided to follow-up with PCP and ENT if no improvement with treatment. Pt is well-appearing, in no acute distress and vital signs are stable.  They appear safe to be discharged.  Return precautions provided. Pt aware of plan and in agreement.   Filed Vitals:   01/30/14 2310 01/30/14 2315 01/30/14 2330 01/30/14 2345  BP: 118/84 116/85 129/85 122/79  Pulse: 96 88 85 88  Temp:      TempSrc:      Resp: 22     Height:      Weight:      SpO2: 98% 100% 100% 100%   Meds given in ED:  Medications - No data to display  Discharge Medication List as of 01/30/2014 11:54 PM    START taking these medications   Details  antipyrine-benzocaine (AURALGAN) otic solution Place 3-4 drops into the left ear every 2 (two) hours as needed for ear pain., Starting 01/30/2014, Until Discontinued, Print    ofloxacin (FLOXIN) 0.3 % otic solution Place 10 drops into the left ear daily., Starting 01/30/2014, Until Discontinued, Print           Harle BattiestElizabeth Chad Tiznado, NP 01/31/14 1707  Glynn OctaveStephen Rancour, MD 01/31/14 [phone]

## 2014-02-02 ENCOUNTER — Encounter (HOSPITAL_COMMUNITY): Payer: Self-pay | Admitting: Emergency Medicine

## 2014-02-02 ENCOUNTER — Emergency Department (HOSPITAL_COMMUNITY)
Admission: EM | Admit: 2014-02-02 | Discharge: 2014-02-02 | Disposition: A | Discharge status: Home / Self Care | Payer: No Typology Code available for payment source | Attending: Emergency Medicine | Admitting: Emergency Medicine

## 2014-02-02 DIAGNOSIS — J45909 Unspecified asthma, uncomplicated: Secondary | ICD-10-CM | POA: Insufficient documentation

## 2014-02-02 DIAGNOSIS — F329 Major depressive disorder, single episode, unspecified: Secondary | ICD-10-CM | POA: Insufficient documentation

## 2014-02-02 DIAGNOSIS — I10 Essential (primary) hypertension: Secondary | ICD-10-CM | POA: Insufficient documentation

## 2014-02-02 DIAGNOSIS — H6092 Unspecified otitis externa, left ear: Secondary | ICD-10-CM | POA: Insufficient documentation

## 2014-02-02 DIAGNOSIS — Z79899 Other long term (current) drug therapy: Secondary | ICD-10-CM | POA: Insufficient documentation

## 2014-02-02 DIAGNOSIS — Z8719 Personal history of other diseases of the digestive system: Secondary | ICD-10-CM | POA: Insufficient documentation

## 2014-02-02 DIAGNOSIS — F419 Anxiety disorder, unspecified: Secondary | ICD-10-CM | POA: Insufficient documentation

## 2014-02-02 MED ORDER — HYDROCODONE-ACETAMINOPHEN 5-325 MG PO TABS: 1.0000 | ORAL_TABLET | Freq: Four times a day (QID) | ORAL | Status: DC | PRN

## 2014-02-02 MED ORDER — AMOXICILLIN-POT CLAVULANATE 500-125 MG PO TABS: 1.0000 | ORAL_TABLET | Freq: Three times a day (TID) | ORAL | Status: DC

## 2014-02-02 NOTE — ED Notes (Signed)
Patient here with complaint of ear pain. Started on 12/26. Was seen and prescribed abx. Presents tonight because pain has become worse.

## 2014-02-02 NOTE — Discharge Instructions (Signed)
Continue Cortisporin drops as prescribed.  Augmentin as prescribed.  Hydrocodone as prescribed as needed for pain.  Follow up with your primary Dr. if not improving in the next 2-3 days.   Otitis Externa Otitis externa is a bacterial or fungal infection of the outer ear canal. This is the area from the eardrum to the outside of the ear. Otitis externa is sometimes called "swimmer's ear." CAUSES  Possible causes of infection include:  Swimming in dirty water.  Moisture remaining in the ear after swimming or bathing.  Mild injury (trauma) to the ear.  Objects stuck in the ear (foreign body).  Cuts or scrapes (abrasions) on the outside of the ear. SIGNS AND SYMPTOMS  The first symptom of infection is often itching in the ear canal. Later signs and symptoms may include swelling and redness of the ear canal, ear pain, and yellowish-white fluid (pus) coming from the ear. The ear pain may be worse when pulling on the earlobe. DIAGNOSIS  Your health care provider will perform a physical exam. A sample of fluid may be taken from the ear and examined for bacteria or fungi. TREATMENT  Antibiotic ear drops are often given for 10 to 14 days. Treatment may also include pain medicine or corticosteroids to reduce itching and swelling. HOME CARE INSTRUCTIONS   Apply antibiotic ear drops to the ear canal as prescribed by your health care provider.  Take medicines only as directed by your health care provider.  If you have diabetes, follow any additional treatment instructions from your health care provider.  Keep all follow-up visits as directed by your health care provider. PREVENTION   Keep your ear dry. Use the corner of a towel to absorb water out of the ear canal after swimming or bathing.  Avoid scratching or putting objects inside your ear. This can damage the ear canal or remove the protective wax that lines the canal. This makes it easier for bacteria and fungi to grow.  Avoid  swimming in lakes, polluted water, or poorly chlorinated pools.  You may use ear drops made of rubbing alcohol and vinegar after swimming. Combine equal parts of white vinegar and alcohol in a bottle. Put 3 or 4 drops into each ear after swimming. SEEK MEDICAL CARE IF:   You have a fever.  Your ear is still red, swollen, painful, or draining pus after 3 days.  Your redness, swelling, or pain gets worse.  You have a severe headache.  You have redness, swelling, pain, or tenderness in the area behind your ear. MAKE SURE YOU:   Understand these instructions.  Will watch your condition.  Will get help right away if you are not doing well or get worse. Document Released: 01/21/2005 Document Revised: 06/07/2013 Document Reviewed: 02/07/2011 [facility] Patient Information 2015 [employer], [place]. This information is not intended to replace advice given to you by your health care provider. Make sure you discuss any questions you have with your health care provider.

## 2014-02-02 NOTE — ED Provider Notes (Signed)
CSN: [id number]     Arrival date & time 02/02/14  0123 History  This chart was scribe for Geoffery Lyonsouglas Zyriah Mask, MD by Angelene GiovanniEmmanuella Mensah, ED Scribe. The patient was seen in room B19C/B19C and the patient's care was started at 2:09 AM.    Chief Complaint  Patient presents with  . Otalgia   The history is provided by the patient. No language interpreter was used.   HPI Comments: Kristen [name] is a 28 y.o. female who presents to the Emergency Department complaining of left ear pain onset 4 days ago. She reports that she is prone to swimmer's ear but has not swam in a while. She denies being able to eat since onset. She reports that she was treated here and was prescribed antibiotics and pain medication but was told that they were discontinued. She reports that she has been compliant with the medication she was given.   Past Medical History  Diagnosis Date  . Pancreatitis   . Hypertension   . Asthma   . Dysrhythmia     PVC's  . Depression   . Anxiety    Past Surgical History  Procedure Laterality Date  . Cholecystectomy    . Open reduction internal fixation (orif) proximal phalanx Right 07/23/2013    Procedure: OPEN TREATMENT RIGHT RING FINGER, PROXIMAL INTERPHALANGEAL/JOINT FRACTURE DISLOCATION;  Surgeon: Jodi Marbleavid A Thompson, MD;  Location: [facility];  Service: Orthopedics;  Laterality: Right;   Family History  Problem Relation Age of Onset  . Hypertension Mother   . Hypertension Father   . Diabetes Maternal Grandmother   . Hypertension Maternal Grandmother   . Cancer Paternal Grandmother     colon   History  Substance Use Topics  . Smoking status: Never Smoker   . Smokeless tobacco: Not on file  . Alcohol Use: No   OB History    Gravida Para Term Preterm AB TAB SAB Ectopic Multiple Living   0              Review of Systems  Constitutional: Positive for appetite change.  HENT: Positive for ear pain.   All other systems reviewed and are  negative.     Allergies  Mucinex  Home Medications   Prior to Admission medications   Medication Sig Start Date End Date Taking? Authorizing Provider  albuterol (PROVENTIL HFA;VENTOLIN HFA) 108 (90 BASE) MCG/ACT inhaler Inhale into the lungs every 6 (six) hours as needed for wheezing or shortness of breath.   Yes Historical Provider, MD  HYDROcodone-acetaminophen (NORCO/VICODIN) 5-325 MG per tablet Take 1-2 tablets by mouth every 6 hours as needed for pain. 06/26/13  Yes Nicole Pisciotta, PA-C  hydrOXYzine (ATARAX/VISTARIL) 25 MG tablet Take 25 mg by mouth 3 (three) times daily as needed for anxiety.   Yes Historical Provider, MD  neomycin-polymyxin-hydrocortisone (CORTISPORIN) otic solution Place 5 drops into the left ear 4 (four) times daily.   Yes Historical Provider, MD  sertraline (ZOLOFT) 100 MG tablet Take 100 mg by mouth daily.   Yes Historical Provider, MD  antipyrine-benzocaine Lyla Son(AURALGAN) otic solution Place 3-4 drops into the left ear every 2 (two) hours as needed for ear pain. Patient not taking: Reported on 02/02/2014 01/30/14   Harle BattiestElizabeth Tysinger, NP  ofloxacin (FLOXIN) 0.3 % otic solution Place 10 drops into the left ear daily. Patient not taking: Reported on 02/02/2014 01/30/14   Harle BattiestElizabeth Tysinger, NP  oxyCODONE-acetaminophen (ROXICET) 5-325 MG per tablet Take 1-2 tablets by mouth every 4 (four) hours as needed (  then, beginning on 07-28-13 max 6 daily). Patient not taking: Reported on 02/02/2014 07/23/13   Jodi Marbleavid A Thompson, MD   BP 120/88 mmHg  Pulse 122  Temp(Src) 98.3 F (36.8 C) (Oral)  Resp 17  Ht 5\' 7"  (1.702 m)  Wt 310 lb (140.615 kg)  BMI 48.54 kg/m2  SpO2 95%  LMP 01/16/2014 Physical Exam  Constitutional: She is oriented to person, place, and time. She appears well-developed and well-nourished. No distress.  HENT:  Head: Normocephalic and atraumatic.  Left ear canal is erythematous with purulent drainage. TM is partially visualized and appears erythematous  as well.   Eyes: Conjunctivae and EOM are normal.  Neck: Neck supple. No tracheal deviation present.  Cardiovascular: Normal rate.   Pulmonary/Chest: Effort normal. No respiratory distress.  Musculoskeletal: Normal range of motion.  Neurological: She is alert and oriented to person, place, and time.  Skin: Skin is warm and dry.  Psychiatric: She has a normal mood and affect. Her behavior is normal.  Nursing note and vitals reviewed.   ED Course  Procedures (including critical care time) DIAGNOSTIC STUDIES: Oxygen Saturation is 95% on RA, adequate by my interpretation.    COORDINATION OF CARE: 2:16 AM- Pt advised of plan for treatment and pt agrees.    Labs Review Labs Reviewed - No data to display  Imaging Review No results found.   EKG Interpretation None      MDM   Final diagnoses:  None    Patient with non-improving otitis externa despite antibiotic drops. She will be given an oral antibiotic and pain medication and advised to continue the Cortisporin. She is to be seen again if not improving in the next 2 days.  I personally performed the services described in this documentation, which was scribed in my presence. The recorded information has been reviewed and is accurate.    Geoffery Lyonsouglas Kassondra Geil, MD 02/02/14 [phone]

## 2014-02-07 ENCOUNTER — Ambulatory Visit: Payer: No Typology Code available for payment source | Attending: Family Medicine | Admitting: Family Medicine

## 2014-02-07 ENCOUNTER — Encounter: Payer: Self-pay | Admitting: Family Medicine

## 2014-02-07 VITALS — BP 130/60 | HR 83 | Temp 97.8°F | Resp 16 | Ht 67.0 in | Wt 318.0 lb

## 2014-02-07 DIAGNOSIS — F329 Major depressive disorder, single episode, unspecified: Secondary | ICD-10-CM | POA: Insufficient documentation

## 2014-02-07 DIAGNOSIS — E282 Polycystic ovarian syndrome: Secondary | ICD-10-CM

## 2014-02-07 DIAGNOSIS — F4010 Social phobia, unspecified: Secondary | ICD-10-CM

## 2014-02-07 DIAGNOSIS — J4520 Mild intermittent asthma, uncomplicated: Secondary | ICD-10-CM

## 2014-02-07 DIAGNOSIS — F4310 Post-traumatic stress disorder, unspecified: Secondary | ICD-10-CM | POA: Insufficient documentation

## 2014-02-07 DIAGNOSIS — H6692 Otitis media, unspecified, left ear: Secondary | ICD-10-CM | POA: Insufficient documentation

## 2014-02-07 MED ORDER — ALBUTEROL SULFATE HFA 108 (90 BASE) MCG/ACT IN AERS: 6.7000 | INHALATION_SPRAY | Freq: Four times a day (QID) | RESPIRATORY_TRACT | Status: DC | PRN

## 2014-02-07 MED ORDER — ALBUTEROL SULFATE HFA 108 (90 BASE) MCG/ACT IN AERS: 2.0000 | INHALATION_SPRAY | Freq: Four times a day (QID) | RESPIRATORY_TRACT | Status: DC | PRN

## 2014-02-07 NOTE — Progress Notes (Signed)
   Subjective:    Patient ID: Kristen [name], female    DOB: Oct 24, 1985, 29 y.o.   MRN: [id number] CC: establish care, L ear infection s/p ER visit  HPI 29 yo F with multiple medical problems including PCOS and PTSD presents to establish care and ED for of L OE  1. L OE: improving. Minimal drainage and itching. No fever. No hearing loss. Taking Augmentin.   2. PCOS: has irregular menses. Would like to conceive but does not believe she is ready. No contraception. Having sex. Has trouble losing weight. Ha facial hair which she plucks.   3. Asthma: intermittent symptoms. Does not have albuterol available. No CP or SOB currently.   Soc Hx: non smoker  Med Hx: depression and PTSD Surg Hx: s/p chole   Review of Systems As per HPI    Objective:   Physical Exam BP 130/60 mmHg  Pulse 83  Temp(Src) 97.8 F (36.6 C) (Oral)  Resp 16  Ht  (1.702 m)  Wt 318 lb (144.244 kg)  BMI 49.79 kg/m2  SpO2 97%  LMP 01/16/2014 General appearance: alert, cooperative and no distress Head: Normocephalic, without obvious abnormality, atraumatic Eyes: conjunctivae/corneas clear. EOMI. Ears: normal TM and external ear canal right ear and abnormal external canal left ear - debris in canal and edematous Nose: Nares normal. Septum midline. Mucosa normal. No drainage or sinus tenderness. Throat: lips, mucosa, and tongue normal; teeth and gums normal Lungs: normal WOB, scant inspiratory wheezing R sided  Heart: regular rate and rhythm, S1, S2 normal, no murmur, click, rub or gallop       Assessment & Plan:

## 2014-02-07 NOTE — Assessment & Plan Note (Addendum)
Open to pregnancy with irregular periods due to PCOS: unless you miss a period for more than 1 month we will consider you not pregnant. I do recommend taking prenatal vitamins once daily. I also recommend birth control if you are not yet ready to conceive (options include IUD, nexplanon, depo, pills, nuvaring).  Patient will need test for insulin resistance (A1c) and CBG at follow up.

## 2014-02-07 NOTE — Patient Instructions (Addendum)
Kristen [name],  Thank you for coming in today. It was a pleasure meeting you. I look forward to being your primary doctor.   1. L ear otitis externa: improving. Ear irrigated today. Complete augmentin.   2. Asthma: refilled albuterol. Please keep a log of asthma symptoms, when they occurs (day and night) and how often you need albuteorl  3. Open to pregnancy with irregular periods due to PCOS: unless you miss a period for more than 1 month we will consider you not pregnant. I do recommend taking prenatal vitamins once daily. I also recommend birth control if you are not yet ready to conceive (options include IUD, nexplanon, depo, pills, nuvaring).   F/u in 3-4 weeks for full physical with pap  Dr. Armen Pickup   Contraception Choices Contraception (birth control) is the use of any methods or devices to prevent pregnancy. Below are some methods to help avoid pregnancy. HORMONAL METHODS   Contraceptive implant. This is a thin, plastic tube containing progesterone hormone. It does not contain estrogen hormone. Your health care provider inserts the tube in the inner part of the upper arm. The tube can remain in place for up to 3 years. After 3 years, the implant must be removed. The implant prevents the ovaries from releasing an egg (ovulation), thickens the cervical mucus to prevent sperm from entering the uterus, and thins the lining of the inside of the uterus.  Progesterone-only injections. These injections are given every 3 months by your health care provider to prevent pregnancy. This synthetic progesterone hormone stops the ovaries from releasing eggs. It also thickens cervical mucus and changes the uterine lining. This makes it harder for sperm to survive in the uterus.  Birth control pills. These pills contain estrogen and progesterone hormone. They work by preventing the ovaries from releasing eggs (ovulation). They also cause the cervical mucus to thicken, preventing the sperm from entering  the uterus. Birth control pills are prescribed by a health care provider.Birth control pills can also be used to treat heavy periods.  Minipill. This type of birth control pill contains only the progesterone hormone. They are taken every day of each month and must be prescribed by your health care provider.  Birth control patch. The patch contains hormones similar to those in birth control pills. It must be changed once a week and is prescribed by a health care provider.  Vaginal ring. The ring contains hormones similar to those in birth control pills. It is left in the vagina for 3 weeks, removed for 1 week, and then a new one is put back in place. The patient must be comfortable inserting and removing the ring from the vagina.A health care provider's prescription is necessary.  Emergency contraception. Emergency contraceptives prevent pregnancy after unprotected sexual intercourse. This pill can be taken right after sex or up to 5 days after unprotected sex. It is most effective the sooner you take the pills after having sexual intercourse. Most emergency contraceptive pills are available without a prescription. Check with your pharmacist. Do not use emergency contraception as your only form of birth control. BARRIER METHODS   Female condom. This is a thin sheath (latex or rubber) that is worn over the penis during sexual intercourse. It can be used with spermicide to increase effectiveness.  Female condom. This is a soft, loose-fitting sheath that is put into the vagina before sexual intercourse.  Diaphragm. This is a soft, latex, dome-shaped barrier that must be fitted by a health care provider. It is  inserted into the vagina, along with a spermicidal jelly. It is inserted before intercourse. The diaphragm should be left in the vagina for 6 to 8 hours after intercourse.  Cervical cap. This is a round, soft, latex or plastic cup that fits over the cervix and must be fitted by a health care  provider. The cap can be left in place for up to 48 hours after intercourse.  Sponge. This is a soft, circular piece of polyurethane foam. The sponge has spermicide in it. It is inserted into the vagina after wetting it and before sexual intercourse.  Spermicides. These are chemicals that kill or block sperm from entering the cervix and uterus. They come in the form of creams, jellies, suppositories, foam, or tablets. They do not require a prescription. They are inserted into the vagina with an applicator before having sexual intercourse. The process must be repeated every time you have sexual intercourse. INTRAUTERINE CONTRACEPTION  Intrauterine device (IUD). This is a T-shaped device that is put in a woman's uterus during a menstrual period to prevent pregnancy. There are 2 types:  Copper IUD. This type of IUD is wrapped in copper wire and is placed inside the uterus. Copper makes the uterus and fallopian tubes produce a fluid that kills sperm. It can stay in place for 10 years.  Hormone IUD. This type of IUD contains the hormone progestin (synthetic progesterone). The hormone thickens the cervical mucus and prevents sperm from entering the uterus, and it also thins the uterine lining to prevent implantation of a fertilized egg. The hormone can weaken or kill the sperm that get into the uterus. It can stay in place for 3-5 years, depending on which type of IUD is used. PERMANENT METHODS OF CONTRACEPTION  Female tubal ligation. This is when the woman's fallopian tubes are surgically sealed, tied, or blocked to prevent the egg from traveling to the uterus.  Hysteroscopic sterilization. This involves placing a small coil or insert into each fallopian tube. Your doctor uses a technique called hysteroscopy to do the procedure. The device causes scar tissue to form. This results in permanent blockage of the fallopian tubes, so the sperm cannot fertilize the egg. It takes about 3 months after the  procedure for the tubes to become blocked. You must use another form of birth control for these 3 months.  Female sterilization. This is when the female has the tubes that carry sperm tied off (vasectomy).This blocks sperm from entering the vagina during sexual intercourse. After the procedure, the man can still ejaculate fluid (semen). NATURAL PLANNING METHODS  Natural family planning. This is not having sexual intercourse or using a barrier method (condom, diaphragm, cervical cap) on days the woman could become pregnant.  Calendar method. This is keeping track of the length of each menstrual cycle and identifying when you are fertile.  Ovulation method. This is avoiding sexual intercourse during ovulation.  Symptothermal method. This is avoiding sexual intercourse during ovulation, using a thermometer and ovulation symptoms.  Post-ovulation method. This is timing sexual intercourse after you have ovulated. Regardless of which type or method of contraception you choose, it is important that you use condoms to protect against the transmission of sexually transmitted infections (STIs). Talk with your health care provider about which form of contraception is most appropriate for you. Document Released: 01/21/2005 Document Revised: 01/26/2013 Document Reviewed: 07/16/2012 [facility] Patient Information 2015 [place], [place]. This information is not intended to replace advice given to you by your health care provider. Make sure  you discuss any questions you have with your health care provider.

## 2014-02-07 NOTE — Assessment & Plan Note (Signed)
Asthma: refilled albuterol. Please keep a log of asthma symptoms, when they occurs (day and night) and how often you need albuteorl

## 2014-02-07 NOTE — Progress Notes (Signed)
Pt comes in to establish care for left ear infection seen in [facility] 12/30 15 Pt was prescribed Amoxicillin with ear drops for sx's  States she feels much better. No drainage or inflammation seen in left ear Slight dizziness noted with tender pain on exam Afebrile  Pt has multiple medical problems

## 2014-03-04 ENCOUNTER — Ambulatory Visit: Payer: Self-pay | Attending: Internal Medicine

## 2014-03-04 ENCOUNTER — Ambulatory Visit: Payer: Self-pay | Attending: Family Medicine

## 2014-03-07 ENCOUNTER — Other Ambulatory Visit: Payer: Self-pay | Admitting: *Deleted

## 2014-03-07 MED ORDER — ALBUTEROL SULFATE HFA 108 (90 BASE) MCG/ACT IN AERS: 2.0000 | INHALATION_SPRAY | Freq: Four times a day (QID) | RESPIRATORY_TRACT | Status: DC | PRN

## 2014-03-15 ENCOUNTER — Encounter: Payer: Self-pay | Admitting: Family Medicine

## 2014-03-25 ENCOUNTER — Ambulatory Visit: Payer: Self-pay | Attending: Family Medicine | Admitting: Family Medicine

## 2014-03-25 ENCOUNTER — Encounter: Payer: Self-pay | Admitting: Family Medicine

## 2014-03-25 VITALS — BP 106/71 | HR 95 | Temp 98.0°F | Resp 16 | Ht 67.0 in | Wt 318.0 lb

## 2014-03-25 DIAGNOSIS — Z6841 Body Mass Index (BMI) 40.0 and over, adult: Secondary | ICD-10-CM | POA: Insufficient documentation

## 2014-03-25 DIAGNOSIS — Z0000 Encounter for general adult medical examination without abnormal findings: Secondary | ICD-10-CM

## 2014-03-25 DIAGNOSIS — Z124 Encounter for screening for malignant neoplasm of cervix: Secondary | ICD-10-CM

## 2014-03-25 DIAGNOSIS — E282 Polycystic ovarian syndrome: Secondary | ICD-10-CM

## 2014-03-25 LAB — COMPLETE METABOLIC PANEL WITH GFR
ALBUMIN: 4.1 g/dL (ref 3.5–5.2)
ALT: 17 U/L (ref 0–35)
AST: 17 U/L (ref 0–37)
Alkaline Phosphatase: 111 U/L (ref 39–117)
BUN: 13 mg/dL (ref 6–23)
CALCIUM: 9.3 mg/dL (ref 8.4–10.5)
CHLORIDE: 103 meq/L (ref 96–112)
CO2: 27 mEq/L (ref 19–32)
Creat: 1.09 mg/dL (ref 0.50–1.10)
GFR, Est African American: 80 mL/min
GFR, Est Non African American: 70 mL/min
GLUCOSE: 89 mg/dL (ref 70–99)
POTASSIUM: 4.5 meq/L (ref 3.5–5.3)
Sodium: 139 mEq/L (ref 135–145)
Total Bilirubin: 0.3 mg/dL (ref 0.2–1.2)
Total Protein: 6.3 g/dL (ref 6.0–8.3)

## 2014-03-25 LAB — POCT URINALYSIS DIPSTICK
Bilirubin, UA: NEGATIVE
Blood, UA: NEGATIVE
Glucose, UA: NEGATIVE
KETONES UA: NEGATIVE
Leukocytes, UA: NEGATIVE
NITRITE UA: NEGATIVE
SPEC GRAV UA: 1.02
Urobilinogen, UA: 0.2
pH, UA: 7

## 2014-03-25 LAB — CBC
HEMATOCRIT: 41.9 % (ref 36.0–46.0)
Hemoglobin: 13.9 g/dL (ref 12.0–15.0)
MCH: 28.5 pg (ref 26.0–34.0)
MCHC: 33.2 g/dL (ref 30.0–36.0)
MCV: 86 fL (ref 78.0–100.0)
MPV: 12.1 fL (ref 8.6–12.4)
Platelets: 243 10*3/uL (ref 150–400)
RBC: 4.87 MIL/uL (ref 3.87–5.11)
RDW: 14.2 % (ref 11.5–15.5)
WBC: 10.7 10*3/uL — AB (ref 4.0–10.5)

## 2014-03-25 LAB — POCT GLYCOSYLATED HEMOGLOBIN (HGB A1C): Hemoglobin A1C: 5.1

## 2014-03-25 LAB — POCT URINE PREGNANCY: PREG TEST UR: NEGATIVE

## 2014-03-25 LAB — GLUCOSE, POCT (MANUAL RESULT ENTRY): POC Glucose: 94 mg/dl (ref 70–99)

## 2014-03-25 NOTE — Progress Notes (Signed)
Patient here for annual with pap smear Patient has PCOS and is curious about metformin

## 2014-03-25 NOTE — Patient Instructions (Signed)
Ms. Katrinka BlazingSmith,  Thank you for coming in today. Normal breast, under arm, groin and pelvic exam today.   You will be called with pap and blood work results.  If labs normal: We will plan to start metformin for PCOS.  Consider a switch from prozac (SSRI) to SNRI like effexor or cymbalta to assist with weight loss.  F/u in 3 months, sooner if needed   Dr. Armen PickupFunches

## 2014-03-27 ENCOUNTER — Encounter: Payer: Self-pay | Admitting: Family Medicine

## 2014-03-27 MED ORDER — METFORMIN HCL ER 500 MG PO TB24: 500.0000 mg | ORAL_TABLET | Freq: Every day | ORAL | Status: DC

## 2014-03-27 NOTE — Assessment & Plan Note (Signed)
A: gaining weight. irregular periods. No  evidence insulin resistance. Normal Cr. P: Trial of metformin

## 2014-03-27 NOTE — Progress Notes (Signed)
   Subjective:    Patient ID: Hildred LaserStephanie Y Sivils, female    DOB: 04/08/1985, 29 y.o.   MRN: [id number] CC; pap smear, PCOS interested in metformin  HPI 29 yo F presents with her mother:  1. Pap: no hx of abnormal pap smears. Has irregular periods due to PCOS. Desires pregnant. Continues to gain weight.   2. PCOS: gaining weight. Periods irregular. Denies polyuria and polydipsia. Interested in metformin. Exercises 5 days of the week, swimming.   Med Hx: PCOS, social phobia, hidradenitis suppurativa, mild  Review of Systems As per HPI    Objective:   Physical Exam BP 106/71 mmHg  Pulse 95  Temp(Src) 98 F (36.7 C)  Resp 16  Ht 5\' 7"  (1.702 m)  Wt 318 lb (144.244 kg)  BMI 49.79 kg/m2  SpO2 99%  LMP 02/07/2014 (Approximate) General appearance: alert, cooperative, no distress and morbidly obese Throat: lips, mucosa, and tongue normal; teeth and gums normal Neck: no adenopathy, supple, symmetrical, trachea midline and thyroid not enlarged, symmetric, no tenderness/mass/nodules Lungs: clear to auscultation bilaterally Breasts: normal appearance, no masses or tenderness, Inspection negative, No nipple retraction or dimpling, No nipple discharge or bleeding, No axillary or supraclavicular adenopathy, Normal to palpation without dominant masses, cystic L breast > R  Heart: regular rate and rhythm, S1, S2 normal, no murmur, click, rub or gallop Abdomen: soft, non-tender; bowel sounds normal; no masses,  no organomegaly Pelvic: cervix normal in appearance, external genitalia normal, no adnexal masses or tenderness, no cervical motion tenderness, uterus normal size, shape, and consistency and vagina normal without discharge Skin: Skin color, texture, turgor normal. No rashes or lesions Lymph nodes: Cervical, supraclavicular, and axillary nodes normal.  Lab Results  Component Value Date   HGBA1C 5.10 03/25/2014   CBG 94  U preg: negative       Assessment & Plan:

## 2014-03-27 NOTE — Assessment & Plan Note (Signed)
Screening pap done today  

## 2014-03-29 ENCOUNTER — Telehealth: Payer: Self-pay | Admitting: *Deleted

## 2014-03-29 LAB — CERVICOVAGINAL ANCILLARY ONLY
CHLAMYDIA, DNA PROBE: NEGATIVE
Neisseria Gonorrhea: NEGATIVE
WET PREP (BD AFFIRM): NEGATIVE
Wet Prep (BD Affirm): NEGATIVE
Wet Prep (BD Affirm): NEGATIVE

## 2014-03-29 LAB — CYTOLOGY - PAP

## 2014-03-29 NOTE — Telephone Encounter (Signed)
-----   Message from Lora PaulaJosalyn C Funches, MD sent at 03/29/2014  9:37 AM EST ----- Negative wet prep, GC/chlam

## 2014-03-29 NOTE — Telephone Encounter (Signed)
-----   Message from Lora PaulaJosalyn C Funches, MD sent at 03/27/2014  8:28 AM EST ----- All labs normal, CMP and CBC Metformin sent in to pharmacy for PCOS

## 2014-03-29 NOTE — Telephone Encounter (Signed)
-----   Message from Lora PaulaJosalyn C Funches, MD sent at 03/29/2014 11:45 AM EST ----- Negative pap, repeat in 3 years

## 2014-03-29 NOTE — Telephone Encounter (Signed)
Left voice message.

## 2014-03-29 NOTE — Telephone Encounter (Signed)
-----   Message from Lora PaulaJosalyn C Funches, MD sent at 03/29/2014  9:08 AM EST ----- Negative wet prep. Negative Gc/chlam

## 2014-03-29 NOTE — Telephone Encounter (Signed)
Left voice message with negative results If any question return call 

## 2014-04-26 ENCOUNTER — Telehealth: Payer: Self-pay | Admitting: Emergency Medicine

## 2014-05-10 ENCOUNTER — Telehealth: Payer: Self-pay | Admitting: *Deleted

## 2014-05-10 DIAGNOSIS — K0381 Cracked tooth: Secondary | ICD-10-CM | POA: Insufficient documentation

## 2014-05-10 DIAGNOSIS — S025XXA Fracture of tooth (traumatic), initial encounter for closed fracture: Secondary | ICD-10-CM

## 2014-05-10 NOTE — Telephone Encounter (Signed)
Referral placed. F/u with me or SD provider recommended to evaluate as patient may need Abx if she has an abscess.

## 2014-05-10 NOTE — Telephone Encounter (Signed)
Patient called in to say she has broken a tooth and it is trying to form an abscess.  She has called a dentist bus is being told that she needs Dr. Armen PickupFunches to put in a referral in order for her to be seen.  Please follow up with this patient.

## 2014-05-11 NOTE — Telephone Encounter (Signed)
Left message, referral placed Schedule appointment with PCP for abscess

## 2014-05-13 ENCOUNTER — Encounter: Payer: Self-pay | Admitting: Family Medicine

## 2014-05-13 ENCOUNTER — Ambulatory Visit: Payer: Self-pay | Attending: Family Medicine | Admitting: Family Medicine

## 2014-05-13 VITALS — BP 122/86 | HR 79 | Temp 98.3°F | Resp 18 | Ht 67.5 in | Wt 319.0 lb

## 2014-05-13 DIAGNOSIS — L304 Erythema intertrigo: Secondary | ICD-10-CM

## 2014-05-13 DIAGNOSIS — H547 Unspecified visual loss: Secondary | ICD-10-CM

## 2014-05-13 DIAGNOSIS — J302 Other seasonal allergic rhinitis: Secondary | ICD-10-CM | POA: Insufficient documentation

## 2014-05-13 DIAGNOSIS — K0381 Cracked tooth: Secondary | ICD-10-CM

## 2014-05-13 MED ORDER — KETOCONAZOLE 2 % EX CREA: 1.0000 "application " | TOPICAL_CREAM | Freq: Every day | CUTANEOUS | Status: DC

## 2014-05-13 MED ORDER — CETIRIZINE HCL 10 MG PO TABS: 10.0000 mg | ORAL_TABLET | Freq: Every day | ORAL | Status: DC

## 2014-05-13 NOTE — Assessment & Plan Note (Signed)
A: seasonal allergies P: zyrtec ordered

## 2014-05-13 NOTE — Assessment & Plan Note (Signed)
Optometry referral

## 2014-05-13 NOTE — Assessment & Plan Note (Signed)
A: intertrigo P: Ketoconazole cream 2% Keep skin cool and dry, use a portable fan  Try wearing briefs

## 2014-05-13 NOTE — Assessment & Plan Note (Signed)
A: broken R lower 2nd molar  P: dental referral placed

## 2014-05-13 NOTE — Progress Notes (Signed)
   Subjective:    Patient ID: Kristen [name], female    DOB: 05/05/1985, 29 y.o.   MRN: [id number] CC: chipped tooth, groin rash, seasonal allergies  HPI  1. Chipped tooth: R 2nd molar chipped while eating earlier this week. Has dental sensitivity. No jaw swelling.   2. Groin rash: x 2 weeks. L groin. ithchy and raw. Patient swims/lifeguards in damp and warm clothes often.   3. Allergies: having congestion and cough. No fever. No treatment to date. Symptom x 3 weeks.   Soc Hx: non smoker  Review of Systems  Constitutional: Negative for fever and chills.  HENT: Positive for dental problem.       Objective:   Physical Exam BP 122/86 mmHg  Pulse 79  Temp(Src) 98.3 F (36.8 C) (Oral)  Resp 18  Ht 5' 7.5" (1.715 m)  Wt 319 lb (144.697 kg)  BMI 49.20 kg/m2  SpO2 95%  LMP 04/18/2014 General appearance: alert, cooperative and no distress Throat: normal findings: lips normal without lesions, soft palate, uvula, and tonsils normal and oropharynx pink & moist without lesions or evidence of thrush and abnormal findings: dentition: multiple carries, chipped R lower 2nd molar  Skin: exam deferred       Assessment & Plan:

## 2014-05-13 NOTE — Progress Notes (Signed)
Requesting Dental referral  Pt complaining of running nose possible seasonal allergies

## 2014-05-13 NOTE — Patient Instructions (Addendum)
Kristen [name],  1. Intertrigo in groin area Ketoconazole cream 2% Keep skin cool and dry, use a portable fan  Try wearing briefs   2. Chipped tooth: dental referral placed  3. Allergies: zyrtec 10 mg daily  4. Decreased vision in L eye: optometry referral   F/u in 3 months for PCOS

## 2014-06-20 ENCOUNTER — Telehealth: Payer: Self-pay | Admitting: *Deleted

## 2014-06-20 NOTE — Telephone Encounter (Signed)
Patient left VM message requesting update on dental referral. An urgent referral was sent on 05/13/14 by referral specialist as below Left HIPAA compliant message on patient's VM to return call    Sent Urgent Referral to [facility] ph. # [phone] Address [address]. They will contact the patient to schedule an appointment I don't know how long it will take.

## 2014-09-20 ENCOUNTER — Telehealth: Payer: Self-pay | Admitting: Family Medicine

## 2014-09-20 NOTE — Telephone Encounter (Signed)
Patient is calling to check on the status of her dental referral. Please follow up with pt.

## 2014-09-20 NOTE — Telephone Encounter (Signed)
I spoke to her grandma and the referral was faxed on April and I re faxed again to [facility]

## 2014-11-23 ENCOUNTER — Ambulatory Visit: Payer: Self-pay | Attending: Family Medicine

## 2015-08-02 IMAGING — CR DG FINGER RING 2+V*R*
3 series · 3 of 3 positions shown · non-contrast
Comparison: None.

CLINICAL DATA: Fall.  Right ring finger pain.

EXAM:
RIGHT RING FINGER 2+V

[x finger pa right]
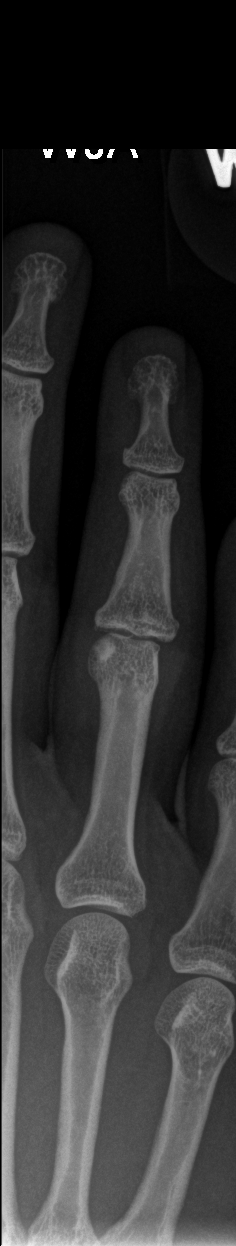

[x finger obl right]
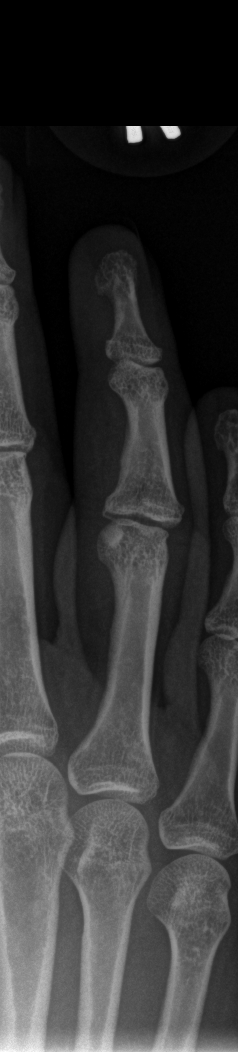

[x finger lat right]
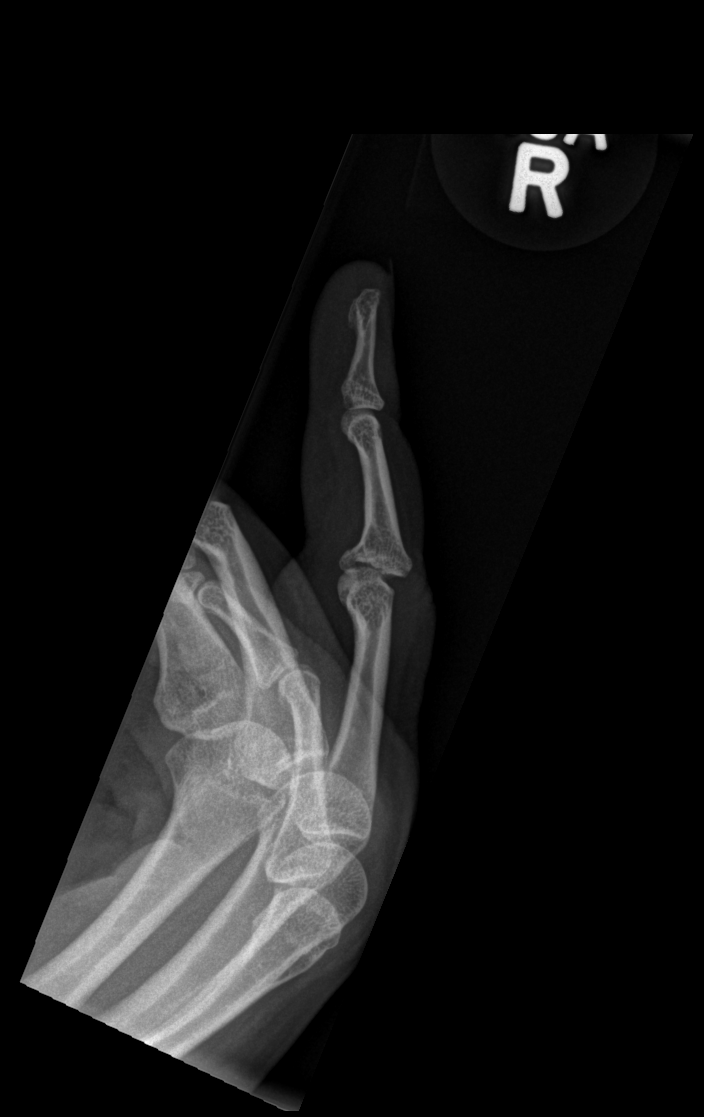

[3 of 3 positions shown; findings below may reference images not displayed]

FINDINGS: There is a fracture of the volar base of the middle phalanx, without
significant displacement.

No other fracture. No dislocation. There is soft tissue swelling
surrounding the PIP joint.
IMPRESSION: 1. Fracture of the volar base of the middle phalanx of the right
ring finger, without significant displacement. No dislocation. No
other fractures.

## 2015-08-16 IMAGING — CR DG FINGER RING 2+V*R*
4 series · 4 of 4 positions shown · non-contrast
Comparison: June 26, 2013

CLINICAL DATA: Pain post trauma

EXAM:
RIGHT FOURTH FINGER 2+V

[x finger pa right]
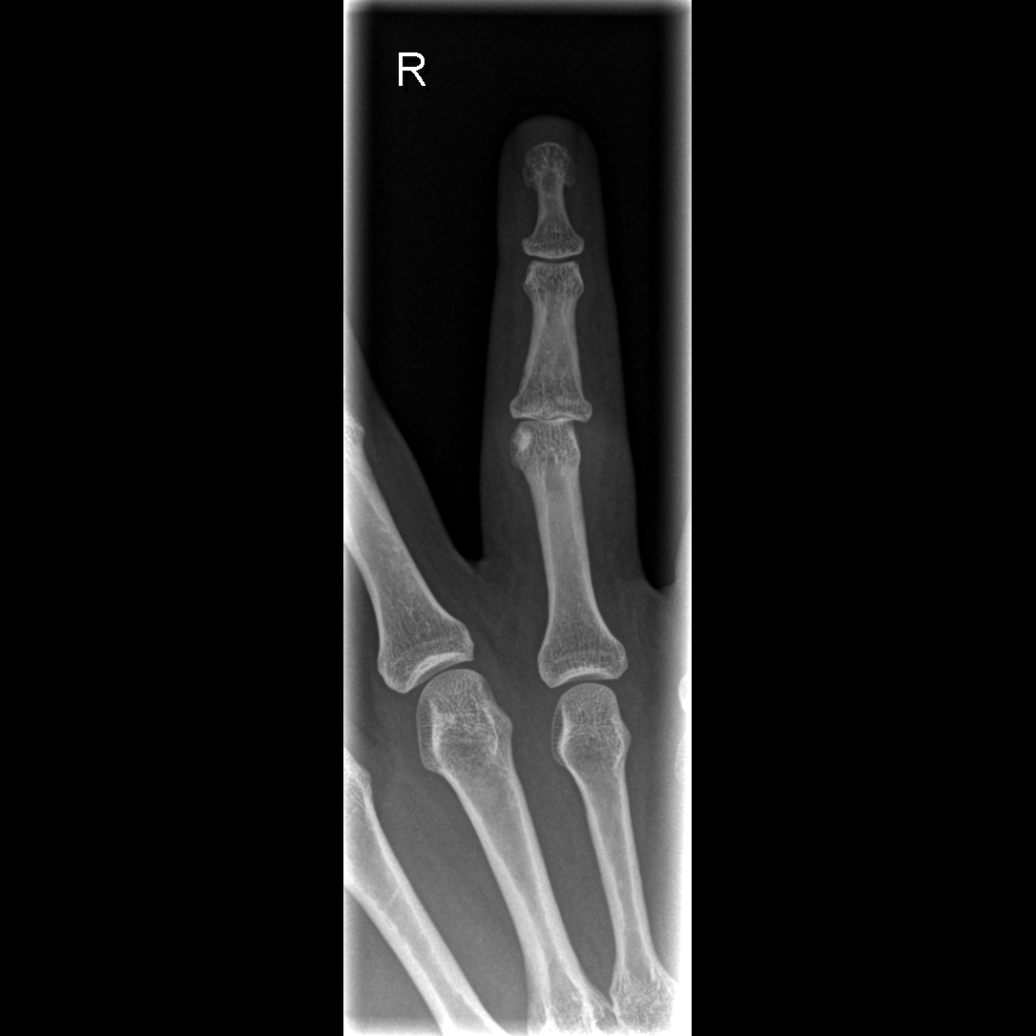

[x finger obl. right]
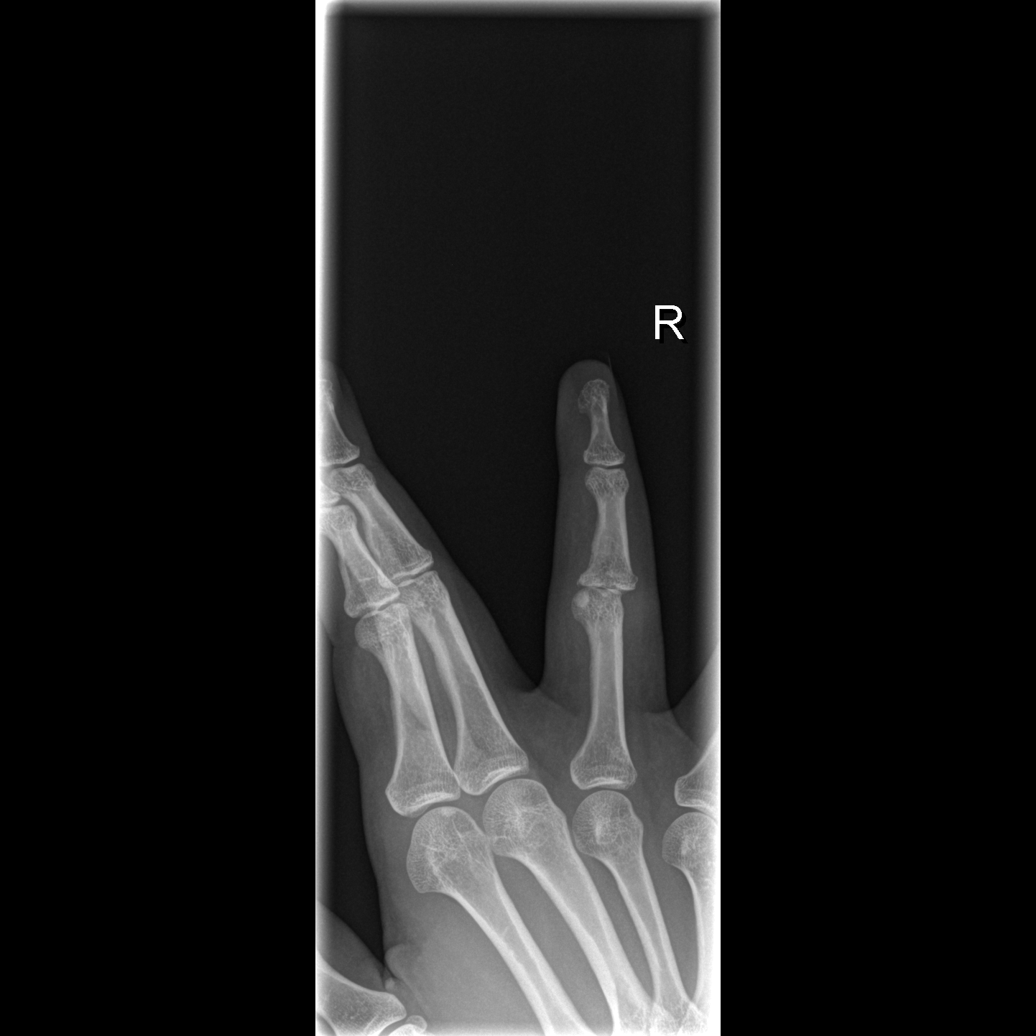

[x finger lateral right (1 of 2)]
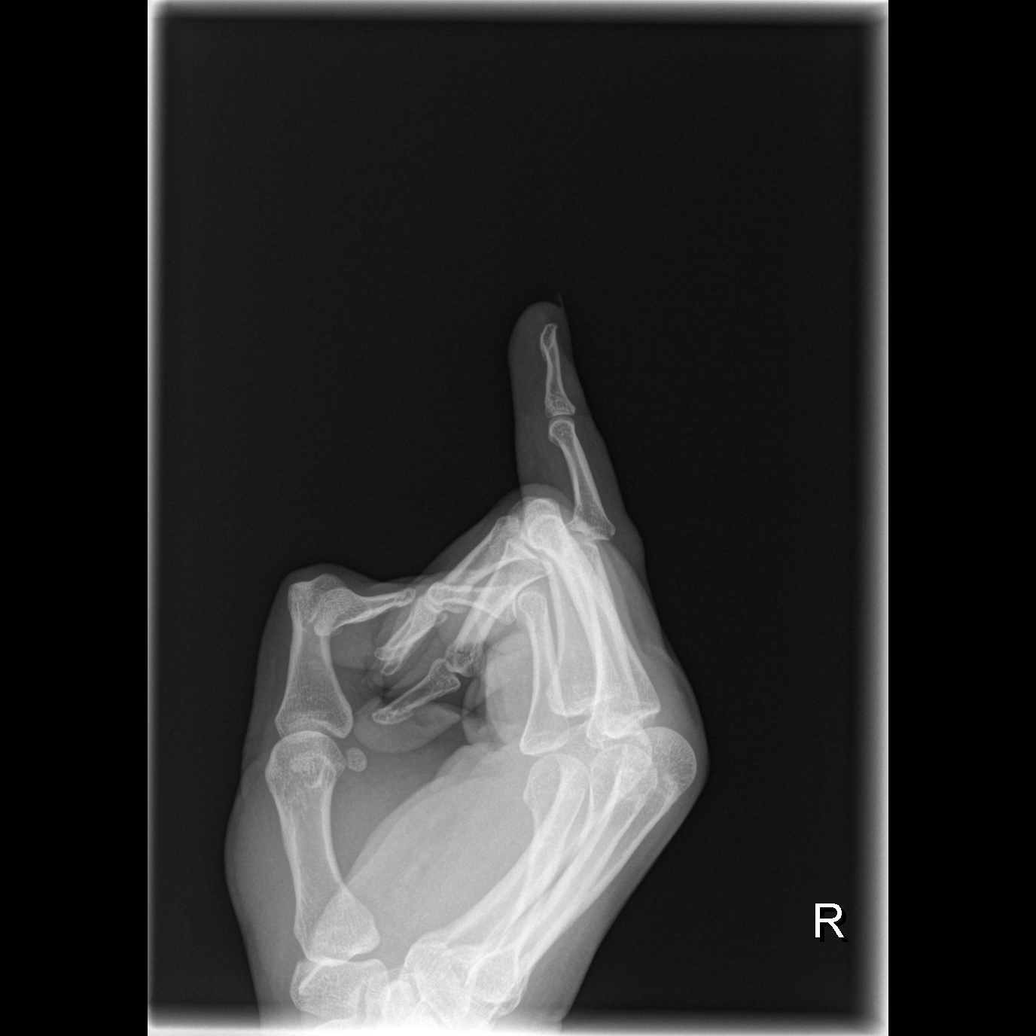

[x finger lateral right (2 of 2)]
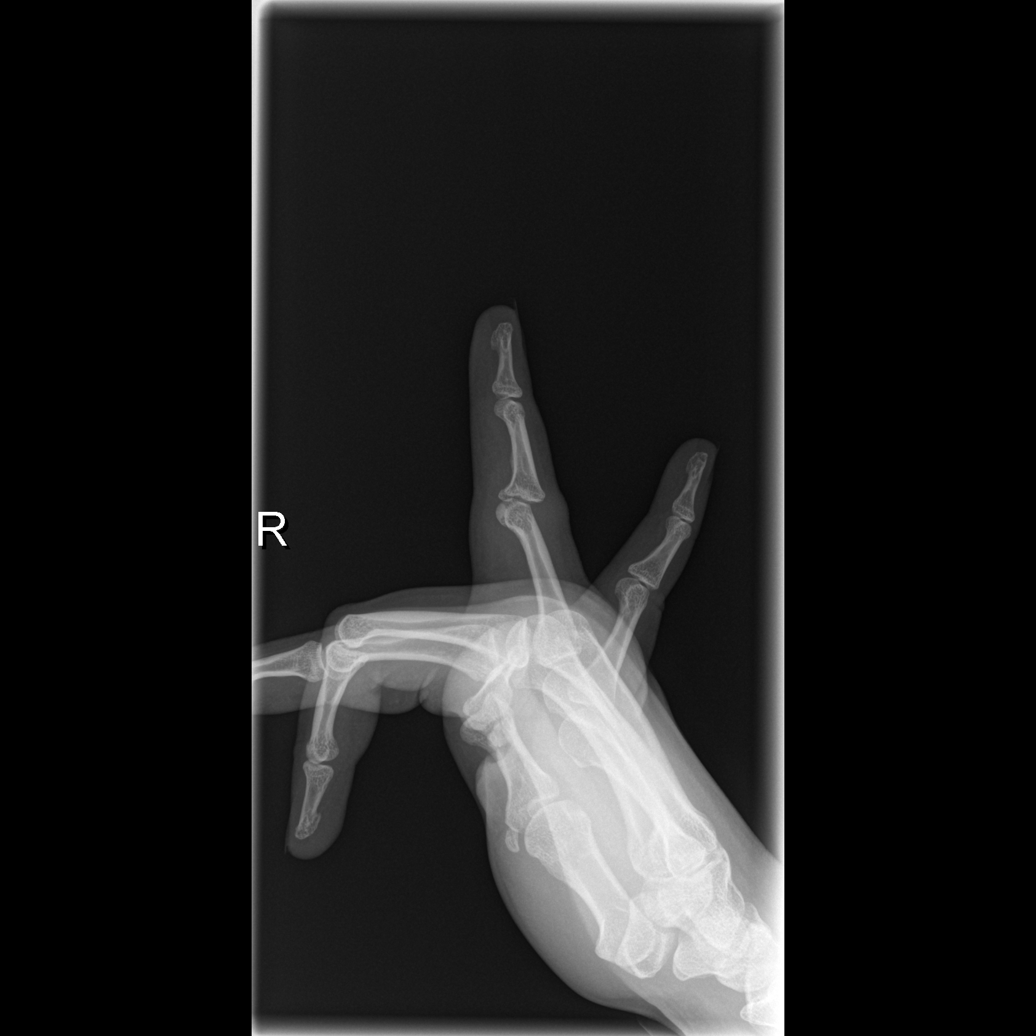

[4 of 4 positions shown; findings below may reference images not displayed]

FINDINGS: Frontal, oblique, and lateral views were obtained. There is again
noted and obliquely oriented fracture along the volar aspect of the
proximal most aspect of the fourth middle phalanx. There is slight
subluxation at the PIP joint with the middle and distal phalanges
subluxed slightly dorsal to the proximal phalanx. There is soft
tissue swelling in this area. No new fracture. No frank dislocation.
Other joint spaces appear intact.
IMPRESSION: Fracture again noted along the volar aspect of the proximal portion
of the fourth middle phalanx without appreciable interval healing.
Mild subluxation at the fourth PIP joint. No frank dislocation. No
new fracture.

## 2015-09-05 ENCOUNTER — Ambulatory Visit (HOSPITAL_COMMUNITY)
Admission: EM | Admit: 2015-09-05 | Discharge: 2015-09-05 | Disposition: A | Discharge status: Home / Self Care | Payer: Self-pay | Attending: Family Medicine | Admitting: Family Medicine

## 2015-09-05 ENCOUNTER — Encounter (HOSPITAL_COMMUNITY): Payer: Self-pay | Admitting: Emergency Medicine

## 2015-09-05 DIAGNOSIS — N390 Urinary tract infection, site not specified: Secondary | ICD-10-CM

## 2015-09-05 LAB — POCT URINALYSIS DIP (DEVICE)
BILIRUBIN URINE: NEGATIVE
Glucose, UA: 100 mg/dL — AB
Ketones, ur: NEGATIVE mg/dL
NITRITE: POSITIVE — AB
PH: 5 (ref 5.0–8.0)
PROTEIN: 30 mg/dL — AB
Specific Gravity, Urine: 1.01 (ref 1.005–1.030)
Urobilinogen, UA: 1 mg/dL (ref 0.0–1.0)

## 2015-09-05 MED ORDER — CEPHALEXIN 500 MG PO CAPS: 500.0000 mg | ORAL_CAPSULE | Freq: Four times a day (QID) | ORAL | 0 refills | Status: DC

## 2015-09-05 MED ORDER — PHENAZOPYRIDINE HCL 200 MG PO TABS: 200.0000 mg | ORAL_TABLET | Freq: Three times a day (TID) | ORAL | 0 refills | Status: DC

## 2015-09-05 NOTE — ED Provider Notes (Signed)
CSN: [id number]     Arrival date & time 09/05/15  1201 History   First MD Initiated Contact with Patient 09/05/15 1220     Chief Complaint  Patient presents with  . Urinary Frequency   (Consider location/radiation/quality/duration/timing/severity/associated sxs/prior Treatment) HPI History obtained from patient:   I have a urinary tract infection    30 Y/O FEMALE WITH ONSET YESTERDAY  OF frequency of urination 5-6 times per day, urine burns when "it hits the skin," occasional vaginal itching.  No relief from increased fluids, cranberry juice. Denies fever/chills.  Some back pain.   Past Medical History:  Diagnosis Date  . Anxiety 2013   treated at [facility] by Deatra Robinson   . Asthma 1989    no hospitalization, or intubation, previously on advair and singulair. asthma worsening.   . 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 Depression 2001   depressed since childhood   . 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 Kyphosis of thoracic region 2004    painful, runs in dad side   . Pancreatitis 2003   gallstone pancreatitis   . PCOS (polycystic ovarian syndrome) 2014    facial hair, irregular periods, never been pregnant    Past Surgical History:  Procedure Laterality Date  . CHOLECYSTECTOMY  2003   . OPEN REDUCTION INTERNAL FIXATION (ORIF) PROXIMAL PHALANX Right 07/23/2013   Procedure: OPEN TREATMENT RIGHT RING FINGER, PROXIMAL INTERPHALANGEAL/JOINT FRACTURE DISLOCATION;  Surgeon: Jodi Marble, MD;  Location: [facility];  Service: Orthopedics;  Laterality: Right;   Family History  Problem Relation Age of Onset  . Hypertension Mother   . Hypertension Father   . Diabetes Maternal Grandmother   . Hypertension Maternal Grandmother   . Cancer Paternal Grandmother     colon  . Scoliosis Father   . Bipolar disorder Father   . Congestive Heart Failure Father   . Arnold-Chiari malformation Mother   . Restless legs syndrome Mother   . Osteoporosis Mother   .  COPD Mother   . Asthma Mother   . Squamous cell carcinoma Mother     skin   . Eczema Mother   . Arthritis Mother   . Peripheral Artery Disease Mother   . Bone cancer Maternal Grandfather 82    bone marrow    Social History  Substance Use Topics  . Smoking status: Never Smoker  . Smokeless tobacco: Never Used  . Alcohol use No   OB History    Gravida Para Term Preterm AB Living   0             SAB TAB Ectopic Multiple Live Births                 Review of Systems  Denies: HEADACHE, NAUSEA, ABDOMINAL PAIN, CHEST PAIN, CONGESTION,  SHORTNESS OF BREATH  Allergies  Mucinex [guaifenesin er]  Home Medications   Prior to Admission medications   Medication Sig Start Date End Date Taking? Authorizing Provider  hydrOXYzine (VISTARIL) 50 MG capsule Take 50 mg by mouth 3 (three) times daily as needed.   Yes Historical Provider, MD  sertraline (ZOLOFT) 100 MG tablet Take 100 mg by mouth daily.   Yes Historical Provider, MD  albuterol (PROVENTIL HFA;VENTOLIN HFA) 108 (90 BASE) MCG/ACT inhaler Inhale 2 puffs into the lungs every 6 (six) hours as needed for wheezing or shortness of breath. 03/07/14   Dessa Phi, MD  cetirizine (ZYRTEC) 10 MG tablet  Take 1 tablet (10 mg total) by mouth daily. 05/13/14   Josalyn Funches, MD  ketoconazole (NIZORAL) 2 % cream Apply 1 application topically daily. 05/13/14   Josalyn Funches, MD  metFORMIN (GLUCOPHAGE-XR) 500 MG 24 hr tablet Take 1-2 tablets (500-1,000 mg total) by mouth daily after supper. Start with 500 mg for one week, then increase to 1000 mg Patient not taking: Reported on 05/13/2014 03/27/14   Dessa Phi, MD  Multiple Vitamin (MULTIVITAMIN) tablet Take 1 tablet by mouth daily.    Historical Provider, MD   Meds Ordered and Administered this Visit  Medications - No data to display  BP 126/86 (BP Location: Left Arm)   Pulse 72   Temp 97.6 F (36.4 C) (Oral)   Resp 16   LMP 09/01/2015 (Exact Date)   SpO2 100%  No data  found.   Physical Exam NURSES NOTES AND VITAL SIGNS REVIEWED. CONSTITUTIONAL: Well developed, well nourished, no acute distress HEENT: normocephalic, atraumatic EYES: Conjunctiva normal NECK:normal ROM, supple, no adenopathy PULMONARY:No respiratory distress, normal effort ABDOMINAL: Soft, ND, NT BS+, No CVAT MUSCULOSKELETAL: Normal ROM of all extremities,  SKIN: warm and dry without rash PSYCHIATRIC: Mood and affect, behavior are normal  Urgent Care Course   Clinical Course    Procedures (including critical care time)  Labs Review Labs Reviewed  POCT URINALYSIS DIP (DEVICE) - Abnormal; Notable for the following:       Result Value   Glucose, UA 100 (*)    Hgb urine dipstick SMALL (*)    Protein, ur 30 (*)    Nitrite POSITIVE (*)    Leukocytes, UA LARGE (*)    All other components within normal limits    Imaging Review No results found.   Visual Acuity Review  Right Eye Distance:   Left Eye Distance:   Bilateral Distance:    Right Eye Near:   Left Eye Near:    Bilateral Near:        KEFLEX AND PYRIDIUM MDM   1. UTI (lower urinary tract infection)     Patient is reassured that there are no issues that require transfer to higher level of care at this time or additional tests. Patient is advised to continue home symptomatic treatment. Patient is advised that if there are new or worsening symptoms to attend the emergency department, contact primary care provider, or return to UC. Instructions of care provided discharged home in stable condition.    THIS NOTE WAS GENERATED USING A VOICE RECOGNITION SOFTWARE PROGRAM. ALL REASONABLE EFFORTS  WERE MADE TO PROOFREAD THIS DOCUMENT FOR ACCURACY.  I have verbally reviewed the discharge instructions with the patient. A printed AVS was given to the patient.  All questions were answered prior to discharge.      Tharon Aquas, PA 09/05/15 1315

## 2015-09-05 NOTE — ED Triage Notes (Signed)
The patient presented to the [facility] with a complaint of urinary frequency and some mild back pain that started last night.

## 2015-09-26 ENCOUNTER — Inpatient Hospital Stay (HOSPITAL_COMMUNITY)
Admission: AD | Admit: 2015-09-26 | Discharge: 2015-09-26 | Disposition: A | Discharge status: Home / Self Care | Payer: Self-pay | Source: Ambulatory Visit | Attending: Family Medicine | Admitting: Family Medicine

## 2015-09-26 DIAGNOSIS — Z79899 Other long term (current) drug therapy: Secondary | ICD-10-CM | POA: Insufficient documentation

## 2015-09-26 DIAGNOSIS — Z825 Family history of asthma and other chronic lower respiratory diseases: Secondary | ICD-10-CM | POA: Insufficient documentation

## 2015-09-26 DIAGNOSIS — Z8249 Family history of ischemic heart disease and other diseases of the circulatory system: Secondary | ICD-10-CM | POA: Insufficient documentation

## 2015-09-26 DIAGNOSIS — J45909 Unspecified asthma, uncomplicated: Secondary | ICD-10-CM | POA: Insufficient documentation

## 2015-09-26 DIAGNOSIS — N939 Abnormal uterine and vaginal bleeding, unspecified: Secondary | ICD-10-CM | POA: Insufficient documentation

## 2015-09-26 DIAGNOSIS — Z888 Allergy status to other drugs, medicaments and biological substances status: Secondary | ICD-10-CM | POA: Insufficient documentation

## 2015-09-26 DIAGNOSIS — E282 Polycystic ovarian syndrome: Secondary | ICD-10-CM | POA: Insufficient documentation

## 2015-09-26 DIAGNOSIS — M545 Low back pain: Secondary | ICD-10-CM | POA: Insufficient documentation

## 2015-09-26 LAB — URINALYSIS, ROUTINE W REFLEX MICROSCOPIC
Bilirubin Urine: NEGATIVE
GLUCOSE, UA: NEGATIVE mg/dL
Ketones, ur: NEGATIVE mg/dL
LEUKOCYTES UA: NEGATIVE
Nitrite: NEGATIVE
PH: 5 (ref 5.0–8.0)
Protein, ur: NEGATIVE mg/dL

## 2015-09-26 LAB — CBC
HEMATOCRIT: 40.8 % (ref 36.0–46.0)
HEMOGLOBIN: 13.9 g/dL (ref 12.0–15.0)
MCH: 29.2 pg (ref 26.0–34.0)
MCHC: 34.1 g/dL (ref 30.0–36.0)
MCV: 85.7 fL (ref 78.0–100.0)
Platelets: 205 10*3/uL (ref 150–400)
RBC: 4.76 MIL/uL (ref 3.87–5.11)
RDW: 13.8 % (ref 11.5–15.5)
WBC: 9.4 10*3/uL (ref 4.0–10.5)

## 2015-09-26 LAB — URINE MICROSCOPIC-ADD ON
BACTERIA UA: NONE SEEN
WBC, UA: NONE SEEN WBC/hpf (ref 0–5)

## 2015-09-26 LAB — POCT PREGNANCY, URINE: Preg Test, Ur: NEGATIVE

## 2015-09-26 MED ORDER — IBUPROFEN 800 MG PO TABS: 800.0000 mg | ORAL_TABLET | Freq: Three times a day (TID) | ORAL | 0 refills | Status: DC

## 2015-09-26 NOTE — Discharge Instructions (Signed)
Abnormal Uterine Bleeding Abnormal uterine bleeding can affect women at various stages in life, including teenagers, women in their reproductive years, pregnant women, and women who have reached menopause. Several kinds of uterine bleeding are considered abnormal, including:  Bleeding or spotting between periods.   Bleeding after sexual intercourse.   Bleeding that is heavier or more than normal.   Periods that last longer than usual.  Bleeding after menopause.  Many cases of abnormal uterine bleeding are minor and simple to treat, while others are more serious. Any type of abnormal bleeding should be evaluated by your health care provider. Treatment will depend on the cause of the bleeding. HOME CARE INSTRUCTIONS Monitor your condition for any changes. The following actions may help to alleviate any discomfort you are experiencing:  Avoid the use of tampons and douches as directed by your health care provider.  Change your pads frequently. You should get regular pelvic exams and Pap tests. Keep all follow-up appointments for diagnostic tests as directed by your health care provider.  SEEK MEDICAL CARE IF:   Your bleeding lasts more than 1 week.   You feel dizzy at times.  SEEK IMMEDIATE MEDICAL CARE IF:   You pass out.   You are changing pads every 15 to 30 minutes.   You have abdominal pain.  You have a fever.   You become sweaty or weak.   You are passing large blood clots from the vagina.   You start to feel nauseous and vomit. MAKE SURE YOU:   Understand these instructions.  Will watch your condition.  Will get help right away if you are not doing well or get worse.   This information is not intended to replace advice given to you by your health care provider. Make sure you discuss any questions you have with your health care provider.   Document Released: 01/21/2005 Document Revised: 01/26/2013 Document Reviewed: 08/20/2012 Elsevier Interactive  Patient Education 2016 [employer] cido flico en el embarazo (Folic Acid in Pregnancy) El cido flico es una vitamina B que ayuda a prevenir la anomala congnita del tubo neural. El tubo neural es la parte de un feto en desarrollo que se convierte en el cerebro y la mdula espinal. Cuando el tubo neural no se cierra como corresponde, el beb nace con un defecto del tubo neural. Los defectos del tubo neural incluyen espina bfida, hernia de la mdula espinal y la ausencia de una parte o de todo el cerebro (anencefalia).  Tome cido flico al menos 4semanas antes de quedar embarazada y Energy Transfer Partnersdurante los primeros 3meses de [place]barazo, que es cuando se desarrolla el tubo neural. El cido flico est disponible en la mayora de las multivitaminas o como suplemento con cido flico solamente, y tambin lo contienen algunos alimentos. El consumo de la cantidad [place] May[place] Islandsadecuada de cido flico antes de la concepcin y durante el embarazo disminuye las probabilidades de Warehouse managertener un beb con un defecto del tubo neural. El hecho de tomar cido flico no influir en un defecto del tubo neural en el caso de que ya se haya desarrollado. DIAGNSTICO   Si el feto tiene un defecto del tubo neural, un anlisis de alfa fetoprotena (AFP) en la sangre o de lquido amnitico mostrar niveles altos de alfa fetoprotena. Este anlisis se hace a todas las Tax inspectorembarazadas en el primer trimestre.  Una ecografa puede detectar un defecto del tubo neural. LO QUE USTED PUEDE HACER:  Tome una multivitamina con al Lowe's Companiesmenos 0,764miligramos (400microgramos) de cido flico todos los  das, al menos 4semanas antes de quedar embarazada y Riminidurante las primeras 12semanas de Psychiatristembarazo.  Si ya ha tenido un beb con un defecto del tubo neural, tome 4miligramos (4000microgramos) de cido flico CarMaxtodos los das. Tome esta cantidad desde 1mes antes de intentar quedar [place]embarazada y [address] tomnd[address] durante los primeros 3meses de [place]barazo. Si tiene trastorno  convulsivo o toma medicamentos para controlar las convulsiones, infrmeselo a su obstetra. Siga tomando el cido flico, excepto si le indican lo contrario.  CIDO FLICO EN LOS ALIMENTOS Siga una dieta saludable con alimentos que contengan cido flico, la forma natural de la vitamina. Algunos de estos alimentos son:  Cereales fortificados para el desayuno.  Lentejas.  Esprragos.  Espinaca.  Vsceras (hgado).  Frijoles negros.  Man (solo si no es Counselling psychologistalrgica).  Brcoli.  Aura CampsFresas, naranjas.  Jugo de naranja (es mejor el jugo concentrado).  Pastas y panes enriquecidos.  Cathleen FearsLechuga romana. CONSULTE A SU MDICO EN LOS SIGUIENTES CASOS:  Est en el primer trimestre de embarazo y tiene niveles altos de azcar en la sangre.  Est en el primer trimestre de embarazo y tiene fiebre alta. En casi todos los casos, un feto en el que se detecta un defecto del tubo neural necesitar atencin especializada, que puede no estar disponible en todos los hospitales. Hable con su mdico sobre lo que es mejor para usted y su beb.   Esta informacin no tiene Theme park managercomo fin reemplazar el consejo del mdico. Asegrese de hacerle al mdico cualquier pregunta que tenga.   Document Released: 11/11/2012 Document Revised: 02/11/2014 Elsevier Interactive Patient Education [employer]2016 Elsevier Inc.

## 2015-09-26 NOTE — MAU Provider Note (Signed)
History     CSN: [id number]  Arrival date and time: 09/26/15 1703   None     Chief Complaint  Patient presents with  . Pelvic Pain  . Vaginal Bleeding  . Back Pain   HPI  Ms.Kristen [name] is 30 y.o. non-pregnant female here with concerns about her menstrual cycle. The patient has a history of PCOS with abnormal periods and is desiring pregnancy. She had a normal period at the beginning of the month and then noticed bleeding for a 2nd after intercourse this week. She said the second cycle was heavier than expected and she thought something was wrong.   She denies abdominal pain currently or dizziness.  Has had some "off and on pelvic burning"  RN note: Thought she was off her period. Then started back up.  Was coming our like water yesterday.  Has PCOS, dx 5 yrs.  Is actively trying to get preg.  Had a UTI  A couple wks ago.  Though she might have a yeast infection, area between vagina and rectum is very irritated, burns when she urinates.  Throbbing pain in pelvis. Pain in low back   OB History    Gravida Para Term Preterm AB Living   0             SAB TAB Ectopic Multiple Live Births                  Past Medical History:  Diagnosis Date  . Anxiety 2013   [facility] by Deatra RobinsonKaren Jones   . Asthma 1989    no hospitalization, or intubation, previously on advair and singulair. asthma worsening.   . 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 Depression 2001   depressed since childhood   . 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 Kyphosis of thoracic region 2004    painful, runs in dad side   . Pancreatitis 2003   gallstone pancreatitis   . PCOS (polycystic ovarian syndrome) 2014    facial hair, irregular periods, never been pregnant     Past Surgical History:  Procedure Laterality Date  . CHOLECYSTECTOMY  2003   . OPEN REDUCTION INTERNAL FIXATION (ORIF) PROXIMAL PHALANX Right 07/23/2013   Procedure: OPEN TREATMENT RIGHT RING FINGER, PROXIMAL  INTERPHALANGEAL/JOINT FRACTURE DISLOCATION;  Surgeon: Jodi Marbleavid A Thompson, MD;  Location: [facility];  Service: Orthopedics;  Laterality: Right;    Family History  Problem Relation Age of Onset  . Hypertension Mother   . Hypertension Father   . Diabetes Maternal Grandmother   . Hypertension Maternal Grandmother   . Cancer Paternal Grandmother     colon  . Scoliosis Father   . Bipolar disorder Father   . Congestive Heart Failure Father   . Arnold-Chiari malformation Mother   . Restless legs syndrome Mother   . Osteoporosis Mother   . COPD Mother   . Asthma Mother   . Squamous cell carcinoma Mother     skin   . Eczema Mother   . Arthritis Mother   . Peripheral Artery Disease Mother   . Bone cancer Maternal Grandfather 82    bone marrow     Social History  Substance Use Topics  . Smoking status: Never Smoker  . Smokeless tobacco: Never Used  . Alcohol use No    Allergies:  Allergies  Allergen Reactions  . Mucinex [Guaifenesin Er] Hives, Itching and Swelling    Prescriptions  Prior to Admission  Medication Sig Dispense Refill Last Dose  . albuterol (PROVENTIL HFA;VENTOLIN HFA) 108 (90 BASE) MCG/ACT inhaler Inhale 2 puffs into the lungs every 6 (six) hours as needed for wheezing or shortness of breath. 6.7 g 3 Taking  . cephALEXin (KEFLEX) 500 MG capsule Take 1 capsule (500 mg total) by mouth 4 (four) times daily. 20 capsule 0   . cetirizine (ZYRTEC) 10 MG tablet Take 1 tablet (10 mg total) by mouth daily. 30 tablet 11   . hydrOXYzine (VISTARIL) 50 MG capsule Take 50 mg by mouth 3 (three) times daily as needed.   09/05/2015 at Unknown time  . ketoconazole (NIZORAL) 2 % cream Apply 1 application topically daily. 15 g 0   . metFORMIN (GLUCOPHAGE-XR) 500 MG 24 hr tablet Take 1-2 tablets (500-1,000 mg total) by mouth daily after supper. Start with 500 mg for one week, then increase to 1000 mg (Patient not taking: Reported on 05/13/2014) 60 tablet 2 Not Taking  .  Multiple Vitamin (MULTIVITAMIN) tablet Take 1 tablet by mouth daily.   Taking  . phenazopyridine (PYRIDIUM) 200 MG tablet Take 1 tablet (200 mg total) by mouth 3 (three) times daily. 6 tablet 0   . sertraline (ZOLOFT) 100 MG tablet Take 100 mg by mouth daily.   09/05/2015 at Unknown time   Recent Results (from the past 2160 hour(s))  POCT urinalysis dip (device)     Status: Abnormal   Collection Time: 09/05/15 12:30 PM  Result Value Ref Range   Glucose, UA 100 (A) NEGATIVE mg/dL   Bilirubin Urine NEGATIVE NEGATIVE   Ketones, ur NEGATIVE NEGATIVE mg/dL   Specific Gravity, Urine 1.010 1.005 - 1.030   Hgb urine dipstick SMALL (A) NEGATIVE   pH 5.0 5.0 - 8.0   Protein, ur 30 (A) NEGATIVE mg/dL   Urobilinogen, UA 1.0 0.0 - 1.0 mg/dL   Nitrite POSITIVE (A) NEGATIVE   Leukocytes, UA LARGE (A) NEGATIVE    Comment: Biochemical Testing Only. Please order routine urinalysis from main lab if confirmatory testing is needed.  Urinalysis, Routine w reflex microscopic (not at [facility])     Status: Abnormal   Collection Time: 09/26/15  5:40 PM  Result Value Ref Range   Color, Urine YELLOW YELLOW   APPearance CLEAR CLEAR   Specific Gravity, Urine >1.030 (H) 1.005 - 1.030   pH 5.0 5.0 - 8.0   Glucose, UA NEGATIVE NEGATIVE mg/dL   Hgb urine dipstick LARGE (A) NEGATIVE   Bilirubin Urine NEGATIVE NEGATIVE   Ketones, ur NEGATIVE NEGATIVE mg/dL   Protein, ur NEGATIVE NEGATIVE mg/dL   Nitrite NEGATIVE NEGATIVE   Leukocytes, UA NEGATIVE NEGATIVE  Urine microscopic-add on     Status: Abnormal   Collection Time: 09/26/15  5:40 PM  Result Value Ref Range   Squamous Epithelial / LPF 0-5 (A) NONE SEEN   WBC, UA NONE SEEN 0 - 5 WBC/hpf   RBC / HPF TOO NUMEROUS TO COUNT 0 - 5 RBC/hpf   Bacteria, UA NONE SEEN NONE SEEN  CBC     Status: None   Collection Time: 09/26/15  6:04 PM  Result Value Ref Range   WBC 9.4 4.0 - 10.5 K/uL   RBC 4.76 3.87 - 5.11 MIL/uL   Hemoglobin 13.9 12.0 - 15.0 g/dL   HCT 16.1 09.6 -  04.5 %   MCV 85.7 78.0 - 100.0 fL   MCH 29.2 26.0 - 34.0 pg   MCHC 34.1 30.0 - 36.0 g/dL   RDW  13.8 11.5 - 15.5 %   Platelets 205 150 - 400 K/uL  Pregnancy, urine POC     Status: None   Collection Time: 09/26/15  6:39 PM  Result Value Ref Range   Preg Test, Ur NEGATIVE NEGATIVE    Comment:        THE SENSITIVITY OF THIS METHODOLOGY IS >24 mIU/mL    Review of Systems  Constitutional: Negative for malaise/fatigue.  Gastrointestinal: Negative for abdominal pain.  Neurological: Negative for dizziness and weakness.   Physical Exam   Blood pressure 122/87, pulse 69, temperature 98.2 F (36.8 C), temperature source Oral, resp. rate 16, weight (!) 315 lb 12.8 oz (143.2 kg), last menstrual period 09/18/2015.  Physical Exam  Constitutional: She is oriented to person, place, and time. She appears well-developed and well-nourished. She appears distressed.  HENT:  Head: Normocephalic.  Musculoskeletal: Normal range of motion.  Neurological: She is alert and oriented to person, place, and time.  Skin: Skin is warm. She is not diaphoretic.    MAU Course  Procedures  None  MDM Patient spoke to in triage due to the long wait time. Discussed labs including H&H> stable at this time. Discussed the need for GYN care and answered all questions from her and her significant other. She was ok with leaving tonight without and exam and plans to follow up with GYN.   Assessment and Plan   A:  1. Abnormal vaginal bleeding     P:  Discharge home in stable condition Bleeding precautions [facility] contact info given Return precautions given   Duane LopeJennifer I Anish Vana, NP 09/30/2015 8:30 PM

## 2015-09-26 NOTE — MAU Note (Signed)
Thought she was off her period. Then started back up.  Was coming our like water yesterday.  Has PCOS, dx 5 yrs.  Is actively trying to get preg.  Had a UTI  A couple wks ago.  Though she might have a yeast infection, area between vagina and rectum is very irritated, burns when she urinates.  Throbbing pain in pelvis. Pain in low back.

## 2016-03-18 ENCOUNTER — Ambulatory Visit (HOSPITAL_COMMUNITY)
Admission: EM | Admit: 2016-03-18 | Discharge: 2016-03-18 | Disposition: A | Discharge status: Home / Self Care | Payer: Self-pay | Attending: Internal Medicine | Admitting: Internal Medicine

## 2016-03-18 ENCOUNTER — Encounter (HOSPITAL_COMMUNITY): Payer: Self-pay | Admitting: Emergency Medicine

## 2016-03-18 DIAGNOSIS — K047 Periapical abscess without sinus: Secondary | ICD-10-CM

## 2016-03-18 DIAGNOSIS — K029 Dental caries, unspecified: Secondary | ICD-10-CM

## 2016-03-18 MED ORDER — HYDROCODONE-ACETAMINOPHEN 5-325 MG PO TABS: 1.0000 | ORAL_TABLET | ORAL | 0 refills | Status: AC | PRN

## 2016-03-18 MED ORDER — PENICILLIN V POTASSIUM 500 MG PO TABS: 500.0000 mg | ORAL_TABLET | Freq: Four times a day (QID) | ORAL | 0 refills | Status: AC

## 2016-03-18 NOTE — ED Provider Notes (Signed)
CSN: [id number]     Arrival date & time 03/18/16  1006 History   First MD Initiated Contact with Patient 03/18/16 1124     Chief Complaint  Patient presents with  . Dental Pain   (Consider location/radiation/quality/duration/timing/severity/associated sxs/prior Treatment) 2 day history of right lower molar pain, pain with chewing and hot/cold foods, Has tried OTC tylenol and ibuprofen without relief. No fever, chills, nausea, or signs of systemic illness of infection.   The history is provided by the patient.  Dental Pain    Past Medical History:  Diagnosis Date  . Anxiety 2013   treated at [facility] by Deatra Robinson   . Asthma 1989    no hospitalization, or intubation, previously on advair and singulair. asthma worsening.   . 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 Depression 2001   depressed since childhood   . 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 Kyphosis of thoracic region 2004    painful, runs in dad side   . Pancreatitis 2003   gallstone pancreatitis   . PCOS (polycystic ovarian syndrome) 2014    facial hair, irregular periods, never been pregnant    Past Surgical History:  Procedure Laterality Date  . CHOLECYSTECTOMY  2003   . OPEN REDUCTION INTERNAL FIXATION (ORIF) PROXIMAL PHALANX Right 07/23/2013   Procedure: OPEN TREATMENT RIGHT RING FINGER, PROXIMAL INTERPHALANGEAL/JOINT FRACTURE DISLOCATION;  Surgeon: Jodi Marble, MD;  Location: [facility];  Service: Orthopedics;  Laterality: Right;   Family History  Problem Relation Age of Onset  . Hypertension Mother   . Hypertension Father   . Diabetes Maternal Grandmother   . Hypertension Maternal Grandmother   . Cancer Paternal Grandmother     colon  . Scoliosis Father   . Bipolar disorder Father   . Congestive Heart Failure Father   . Arnold-Chiari malformation Mother   . Restless legs syndrome Mother   . Osteoporosis Mother   . COPD Mother   . Asthma Mother   . Squamous cell  carcinoma Mother     skin   . Eczema Mother   . Arthritis Mother   . Peripheral Artery Disease Mother   . Bone cancer Maternal Grandfather 82    bone marrow    Social History  Substance Use Topics  . Smoking status: Never Smoker  . Smokeless tobacco: Never Used  . Alcohol use No   OB History    Gravida Para Term Preterm AB Living   0             SAB TAB Ectopic Multiple Live Births                 Review of Systems  Reason unable to perform ROS: as covered in HPI.  Constitutional: Negative.   HENT: Positive for dental problem.   Respiratory: Negative.   Cardiovascular: Negative.   Gastrointestinal: Negative.   All other systems reviewed and are negative.   Allergies  Mucinex [guaifenesin er]  Home Medications   Prior to Admission medications   Medication Sig Start Date End Date Taking? Authorizing Provider  hydrOXYzine (VISTARIL) 50 MG capsule Take 50 mg by mouth 3 (three) times daily as needed.   Yes Historical Provider, MD  ibuprofen (ADVIL,MOTRIN) 800 MG tablet Take 1 tablet (800 mg total) by mouth 3 (three) times daily. 09/26/15  Yes Duane Lope, NP  Multiple Vitamin (MULTIVITAMIN) tablet Take 1 tablet by mouth daily.  Yes Historical Provider, MD  sertraline (ZOLOFT) 100 MG tablet Take 100 mg by mouth daily.   Yes Historical Provider, MD  albuterol (PROVENTIL HFA;VENTOLIN HFA) 108 (90 BASE) MCG/ACT inhaler Inhale 2 puffs into the lungs every 6 (six) hours as needed for wheezing or shortness of breath. 03/07/14   Josalyn Funches, MD  cephALEXin (KEFLEX) 500 MG capsule Take 1 capsule (500 mg total) by mouth 4 (four) times daily. 09/05/15   Tharon AquasFrank C Patrick, PA  cetirizine (ZYRTEC) 10 MG tablet Take 1 tablet (10 mg total) by mouth daily. 05/13/14   Dessa PhiJosalyn Funches, MD  HYDROcodone-acetaminophen (NORCO/VICODIN) 5-325 MG tablet Take 1-2 tablets by mouth every 4 (four) hours as needed. 03/18/16 03/23/16  Dorena BodoLawrence Rae Sutcliffe, NP  ketoconazole (NIZORAL) 2 % cream Apply 1  application topically daily. 05/13/14   Josalyn Funches, MD  metFORMIN (GLUCOPHAGE-XR) 500 MG 24 hr tablet Take 1-2 tablets (500-1,000 mg total) by mouth daily after supper. Start with 500 mg for one week, then increase to 1000 mg Patient not taking: Reported on 05/13/2014 03/27/14   Dessa PhiJosalyn Funches, MD  penicillin v potassium (VEETID) 500 MG tablet Take 1 tablet (500 mg total) by mouth 4 (four) times daily. 03/18/16 03/25/16  Dorena BodoLawrence Aby Gessel, NP  phenazopyridine (PYRIDIUM) 200 MG tablet Take 1 tablet (200 mg total) by mouth 3 (three) times daily. 09/05/15   Tharon AquasFrank C Patrick, PA   Meds Ordered and Administered this Visit  Medications - No data to display  BP 140/60 (BP Location: Right Arm)   Pulse 98   Temp 98.9 F (37.2 C) (Oral)   LMP 02/18/2016 (Exact Date)   SpO2 96%  No data found.   Physical Exam  Constitutional: She is oriented to person, place, and time. She appears well-developed and well-nourished. No distress.  HENT:  Head: Normocephalic and atraumatic.  Right Ear: External ear normal.  Left Ear: External ear normal.  Mouth/Throat: Oropharynx is clear and moist.    Neck: Normal range of motion. Neck supple. No JVD present.  Cardiovascular: Normal rate and regular rhythm.   Pulmonary/Chest: Effort normal and breath sounds normal.  Lymphadenopathy:       Head (right side): No submental, no submandibular and no tonsillar adenopathy present.       Head (left side): No submental, no submandibular and no tonsillar adenopathy present.    She has no cervical adenopathy.  Neurological: She is alert and oriented to person, place, and time.  Skin: Skin is warm and dry. Capillary refill takes less than 2 seconds. She is not diaphoretic.  Psychiatric: She has a normal mood and affect.  Nursing note and vitals reviewed.   Urgent Care Course     Procedures (including critical care time)  Labs Review Labs Reviewed - No data to display  Imaging Review No results found.   Visual  Acuity Review  Right Eye Distance:   Left Eye Distance:   Bilateral Distance:    Right Eye Near:   Left Eye Near:    Bilateral Near:         MDM   1. Infected dental caries   You have an infected dental carie, I have prescribed penicillin, take 1 tablet 4 times a day. I have also prescribed hydrocodone for pain. This is a narcotic, it is addictive, it will cause drowsiness, do not drink or drive while taking. You will need to follow up with a dentist for care of your tooth. We will not be able to extend you pain medicine  should you choose not to see a dentist and your pain will continue till you seek dental care.  [place] Controlled Substances reporting system consulted prior to issuing a prescription. No narcotics prescribed in previous 6 months.     Dorena Bodo, NP 03/18/16 1140

## 2016-03-18 NOTE — ED Triage Notes (Signed)
Pt has been suffering from right lower dental pain since Saturday.

## 2016-03-18 NOTE — Discharge Instructions (Signed)
You have an infected dental carie, I have prescribed penicillin, take 1 tablet 4 times a day. I have also prescribed hydrocodone for pain. This is a narcotic, it is addictive, it will cause drowsiness, do not drink or drive while taking. You will need to follow up with a dentist for care of your tooth. We will not be able to extend you pain medicine should you choose not to see a dentist and your pain will continue till you seek dental care.

## 2016-03-22 ENCOUNTER — Ambulatory Visit: Payer: Self-pay | Attending: Family Medicine

## 2016-03-22 ENCOUNTER — Telehealth: Payer: Self-pay | Admitting: Family Medicine

## 2016-03-22 DIAGNOSIS — K0889 Other specified disorders of teeth and supporting structures: Secondary | ICD-10-CM

## 2016-03-22 NOTE — Telephone Encounter (Signed)
Pt will not receive letter for work. Due to dental pain not prohibiting her work.

## 2016-03-22 NOTE — Telephone Encounter (Signed)
Will route to PCP 

## 2016-03-22 NOTE — Telephone Encounter (Signed)
Patient came today to apply for her financial assistance and [employer]. Pt needs an referral as soon as possible for an oral surgeon. Please follow up.  Thank you.

## 2016-03-22 NOTE — Telephone Encounter (Signed)
Pt was at urgent care this week for the pain in her mouth. Pt has to got to work tomorrow but can't perform all her job duties. Pt wants to know if Dr. Armen PickupFunches can write her a letter stating that she can only do low impact activities. Please follow up.  Thank you.

## 2016-03-22 NOTE — Telephone Encounter (Signed)
Dental referral placed.

## 2016-04-01 ENCOUNTER — Ambulatory Visit: Payer: Self-pay | Admitting: Family Medicine

## 2016-04-09 ENCOUNTER — Encounter: Payer: Self-pay | Admitting: Family Medicine

## 2016-04-09 ENCOUNTER — Ambulatory Visit: Payer: Self-pay | Attending: Family Medicine | Admitting: Family Medicine

## 2016-04-09 VITALS — BP 107/70 | HR 112 | Temp 98.1°F | Ht 67.5 in | Wt 309.8 lb

## 2016-04-09 DIAGNOSIS — Z7984 Long term (current) use of oral hypoglycemic drugs: Secondary | ICD-10-CM | POA: Insufficient documentation

## 2016-04-09 DIAGNOSIS — Z6841 Body Mass Index (BMI) 40.0 and over, adult: Secondary | ICD-10-CM | POA: Insufficient documentation

## 2016-04-09 DIAGNOSIS — N920 Excessive and frequent menstruation with regular cycle: Secondary | ICD-10-CM | POA: Insufficient documentation

## 2016-04-09 DIAGNOSIS — Z79899 Other long term (current) drug therapy: Secondary | ICD-10-CM | POA: Insufficient documentation

## 2016-04-09 DIAGNOSIS — Z0001 Encounter for general adult medical examination with abnormal findings: Secondary | ICD-10-CM | POA: Insufficient documentation

## 2016-04-09 DIAGNOSIS — K0889 Other specified disorders of teeth and supporting structures: Secondary | ICD-10-CM | POA: Insufficient documentation

## 2016-04-09 DIAGNOSIS — E282 Polycystic ovarian syndrome: Secondary | ICD-10-CM | POA: Insufficient documentation

## 2016-04-09 LAB — POCT GLYCOSYLATED HEMOGLOBIN (HGB A1C): HEMOGLOBIN A1C: 5

## 2016-04-09 LAB — POCT CBG (FASTING - GLUCOSE)-MANUAL ENTRY: GLUCOSE FASTING, POC: 99 mg/dL (ref 70–99)

## 2016-04-09 MED ORDER — CHLORHEXIDINE GLUCONATE 0.12 % MT SOLN: 15.0000 mL | Freq: Two times a day (BID) | OROMUCOSAL | 0 refills | Status: DC

## 2016-04-09 MED ORDER — CLINDAMYCIN HCL 300 MG PO CAPS: 300.0000 mg | ORAL_CAPSULE | Freq: Two times a day (BID) | ORAL | 0 refills | Status: DC

## 2016-04-09 MED ORDER — METFORMIN HCL ER 500 MG PO TB24: 500.0000 mg | ORAL_TABLET | Freq: Every day | ORAL | 2 refills | Status: DC

## 2016-04-09 NOTE — Progress Notes (Signed)
Subjective:  Patient ID: Kristen [name], female    DOB: [birth year]  Age: 31 y.o. MRN: [id number]  CC: Referral   HPI Kristen [name] has PCOS, social phobia, morbid obesity she  presents for   1. Dental pain: R second molar. She had pain and swelling. Treated with penicillin. Improved. She has chipped second molar. No pain or swelling now. She is waiting for dental appointment.   2. PCOS: she is attempting to lose weight. She did not try metformin. She desires children. She has prolonged and heavy periods.   Social History  Substance Use Topics  . Smoking status: Never Smoker  . Smokeless tobacco: Never Used  . Alcohol use No    Outpatient Medications Prior to Visit  Medication Sig Dispense Refill  . albuterol (PROVENTIL HFA;VENTOLIN HFA) 108 (90 BASE) MCG/ACT inhaler Inhale 2 puffs into the lungs every 6 (six) hours as needed for wheezing or shortness of breath. 6.7 g 3  . hydrOXYzine (VISTARIL) 50 MG capsule Take 50 mg by mouth 3 (three) times daily as needed.    . sertraline (ZOLOFT) 100 MG tablet Take 100 mg by mouth daily.    . cephALEXin (KEFLEX) 500 MG capsule Take 1 capsule (500 mg total) by mouth 4 (four) times daily. (Patient not taking: Reported on 04/09/2016) 20 capsule 0  . cetirizine (ZYRTEC) 10 MG tablet Take 1 tablet (10 mg total) by mouth daily. (Patient not taking: Reported on 04/09/2016) 30 tablet 11  . ibuprofen (ADVIL,MOTRIN) 800 MG tablet Take 1 tablet (800 mg total) by mouth 3 (three) times daily. (Patient not taking: Reported on 04/09/2016) 21 tablet 0  . ketoconazole (NIZORAL) 2 % cream Apply 1 application topically daily. (Patient not taking: Reported on 04/09/2016) 15 g 0  . metFORMIN (GLUCOPHAGE-XR) 500 MG 24 hr tablet Take 1-2 tablets (500-1,000 mg total) by mouth daily after supper. Start with 500 mg for one week, then increase to 1000 mg (Patient not taking: Reported on 05/13/2014) 60 tablet 2  . Multiple Vitamin (MULTIVITAMIN) tablet Take 1 tablet by mouth  daily.    . phenazopyridine (PYRIDIUM) 200 MG tablet Take 1 tablet (200 mg total) by mouth 3 (three) times daily. (Patient not taking: Reported on 04/09/2016) 6 tablet 0   No facility-administered medications prior to visit.     ROS Review of Systems  Constitutional: Negative for chills and fever.  HENT: Positive for dental problem.   Eyes: Negative for visual disturbance.  Respiratory: Negative for shortness of breath.   Cardiovascular: Negative for chest pain.  Gastrointestinal: Negative for abdominal pain and blood in stool.  Musculoskeletal: Negative for arthralgias and back pain.  Skin: Negative for rash.  Allergic/Immunologic: Negative for immunocompromised state.  Hematological: Negative for adenopathy. Does not bruise/bleed easily.  Psychiatric/Behavioral: Negative for dysphoric mood and suicidal ideas.    Objective:  BP 107/70 (BP Location: Left Wrist, Patient Position: Sitting, Cuff Size: Small)   Pulse (!) 112   Temp 98.1 F (36.7 C) (Oral)   Ht 5' 7.5" (1.715 m)   Wt (!) 309 lb 12.8 oz (140.5 kg)   LMP 03/07/2016   SpO2 99%   BMI 47.81 kg/m   BP/Weight 04/09/2016 03/18/2016 09/26/2015  Systolic BP 107 140 135  Diastolic BP 70 60 91  Wt. (Lbs) 309.8 - 315.8  BMI 47.81 - 48.73   Wt Readings from Last 3 Encounters:  04/09/16 (!) 309 lb 12.8 oz (140.5 kg)  09/26/15 (!) 315 lb 12.8 oz (143.2 kg)  05/13/14 (!) 319 lb (144.7 kg)   Physical Exam  Constitutional: She is oriented to person, place, and time. She appears well-developed and well-nourished. No distress.  Obese   HENT:  Head: Normocephalic and atraumatic.  Mouth/Throat:    Cardiovascular: Normal rate, regular rhythm, normal heart sounds and intact distal pulses.   Pulmonary/Chest: Effort normal and breath sounds normal.  Musculoskeletal: She exhibits no edema.  Neurological: She is alert and oriented to person, place, and time.  Skin: Skin is warm and dry. No rash noted.  Psychiatric: She has a normal  mood and affect.   Lab Results  Component Value Date   HGBA1C 5.10 03/25/2014   Lab Results  Component Value Date   HGBA1C 5.0 04/09/2016    CBG 99 Assessment & Plan:  Kristen [name] was seen today for referral.  Diagnoses and all orders for this visit:  Pain, dental -     chlorhexidine (PERIDEX) 0.12 % solution; Use as directed 15 mLs in the mouth or throat 2 (two) times daily. -     clindamycin (CLEOCIN) 300 MG capsule; Take 1 capsule (300 mg total) by mouth 2 (two) times daily.  PCOS (polycystic ovarian syndrome) -     POCT glycosylated hemoglobin (Hb A1C) -     Glucose (CBG), Fasting -     metFORMIN (GLUCOPHAGE-XR) 500 MG 24 hr tablet; Take 1-2 tablets (500-1,000 mg total) by mouth daily after supper. Start with 500 mg for one week, then increase to 1000 mg   There are no diagnoses linked to this encounter.  No orders of the defined types were placed in this encounter.   Follow-up: Return in about 6 weeks (around 05/21/2016) for PCOS .   Dessa PhiJosalyn Jae Bruck MD

## 2016-04-09 NOTE — Assessment & Plan Note (Signed)
Start metformin.

## 2016-04-09 NOTE — Patient Instructions (Addendum)
  [name] was seen today for referral.  Diagnoses and all orders for this visit:  Pain, dental -     chlorhexidine (PERIDEX) 0.12 % solution; Use as directed 15 mLs in the mouth or throat 2 (two) times daily. -     clindamycin (CLEOCIN) 300 MG capsule; Take 1 capsule (300 mg total) by mouth 2 (two) times daily.  PCOS (polycystic ovarian syndrome) -     POCT glycosylated hemoglobin (Hb A1C) -     Glucose (CBG), Fasting -     metFORMIN (GLUCOPHAGE-XR) 500 MG 24 hr tablet; Take 1-2 tablets (500-1,000 mg total) by mouth daily after supper. Start with 500 mg for one week, then increase to 1000 mg  rinse mouth with peridex for next week to prevent recurrent infection If infection occurs start clindamycin   Your dental referral was processed on 03/24/2016 Sent Urgent Referral to [facility] ph. # [phone] [address] [place], [place] [id number] They will contact the you  to schedule an appointment, unfortunately we don't know how long it will take. You are encouraged to call to inquire about length of time.   The purpose of metformin in PCOS is to promote weight loss which will results in more regular ovulation.   F/u in 6 weeks for PCOS   Dr. Armen PickupFunches

## 2016-04-09 NOTE — Progress Notes (Signed)
Pt is here today for a referral to the dentist.

## 2016-04-09 NOTE — Assessment & Plan Note (Signed)
rinse mouth with peridex for next week to prevent recurrent infection If infection occurs start clindamycin

## 2016-04-10 MED FILL — METFORMIN HCL ER 500 MG TAB: 500 | 30 days supply | Qty: 60 | Fill #0

## 2016-04-10 MED FILL — CLINDAMYCIN HCL 300 MG CAP: 300 | 7 days supply | Qty: 14 | Fill #0

## 2016-04-10 MED FILL — CHLORHEXIDINE 0.12% RINSE: 0.12 | 15 days supply | Qty: 473 | Fill #0

## 2016-05-24 ENCOUNTER — Ambulatory Visit: Payer: Self-pay | Attending: Family Medicine | Admitting: Family Medicine

## 2016-05-24 ENCOUNTER — Encounter: Payer: Self-pay | Admitting: Family Medicine

## 2016-05-24 VITALS — BP 147/80 | HR 85 | Temp 97.9°F | Resp 16 | Wt 305.6 lb

## 2016-05-24 DIAGNOSIS — G2581 Restless legs syndrome: Secondary | ICD-10-CM | POA: Insufficient documentation

## 2016-05-24 DIAGNOSIS — R946 Abnormal results of thyroid function studies: Secondary | ICD-10-CM

## 2016-05-24 DIAGNOSIS — R5383 Other fatigue: Secondary | ICD-10-CM | POA: Insufficient documentation

## 2016-05-24 DIAGNOSIS — Z114 Encounter for screening for human immunodeficiency virus [HIV]: Secondary | ICD-10-CM

## 2016-05-24 DIAGNOSIS — E282 Polycystic ovarian syndrome: Secondary | ICD-10-CM | POA: Insufficient documentation

## 2016-05-24 DIAGNOSIS — Z7984 Long term (current) use of oral hypoglycemic drugs: Secondary | ICD-10-CM | POA: Insufficient documentation

## 2016-05-24 DIAGNOSIS — Z79899 Other long term (current) drug therapy: Secondary | ICD-10-CM | POA: Insufficient documentation

## 2016-05-24 DIAGNOSIS — R7989 Other specified abnormal findings of blood chemistry: Secondary | ICD-10-CM

## 2016-05-24 DIAGNOSIS — E229 Hyperfunction of pituitary gland, unspecified: Secondary | ICD-10-CM

## 2016-05-24 NOTE — Assessment & Plan Note (Signed)
A: PCOS P: Continue metformin 500 mg daily Pelvic and TVUS Labs per orders

## 2016-05-24 NOTE — Assessment & Plan Note (Signed)
Symptoms of restless leg syndrome Checking iron levels Advised increase exercise

## 2016-05-24 NOTE — Progress Notes (Signed)
Pt is in the office today for a f/u on pcos Pt states she is fatigue Pt states she isn't sleeping good

## 2016-05-24 NOTE — Patient Instructions (Addendum)
Kristen [name] was seen today for follow-up and fatigue.  Diagnoses and all orders for this visit:  PCOS (polycystic ovarian syndrome) -     US Pelvis Complete; Future -     US Transvaginal Non-OB; Future -     TSH+Prl+FSH+TestT+LH+DHEA S... -     Insulin-like growth factor  Encounter for screening for HIV -     HIV antibody (with reflex)  Restless legs -     Iron and TIBC -     Ferritin   Increase exercise for restless leg symptoms, see [employer] scholarship Please continue zoloft and be sure to follow up with [facility] for depression symptoms continue metformin 500 mg XR for now  You will be called with lab and ultrasound results   f/u in 6 weeks for PCOS   Dr. Armen Pickup

## 2016-05-24 NOTE — Progress Notes (Signed)
Subjective:  Patient ID: Kristen [name], female    DOB: 12-19-1985  Age: 31 y.o. MRN: [id number]  CC: Follow-up and Fatigue   HPI Kristen [name] has PCOS, social phobia, morbid obesity she  presents for   1. PCOS: menarche at age 34. She reports her menstrual periods have become more irregular with age. She usually has a period every month.  Periods are heavy and painful. Bleeding last from 1-2 weeks.  She is attempting to lose weight. She desires children. She has prolonged and heavy periods. She reports having a pelvic ultrasound in 2011 at [facility] for heavy menstrual bleeding following missed period for 2 months.    She has never had a positive pregnancy test.  Mom has uterine fibroids and hypothyroidism.  She has a maternal cousin who has history of cervical cancer.   2. Restlessness: x one year. Occur only at night. In her legs. She has to get up and walk around. Walking around helps with the restlessness. She gets 4-5 hrs of sleep at night. She is a chronic smoker. She has not tried any treatments. She reports symptoms started after she stopped regular swimming.   3. Dental problem: pain improved with clindamycin and peridex. She is still waiting to see dental.    Social History  Substance Use Topics  . Smoking status: Never Smoker  . Smokeless tobacco: Never Used  . Alcohol use No    Outpatient Medications Prior to Visit  Medication Sig Dispense Refill  . albuterol (PROVENTIL HFA;VENTOLIN HFA) 108 (90 BASE) MCG/ACT inhaler Inhale 2 puffs into the lungs every 6 (six) hours as needed for wheezing or shortness of breath. 6.7 g 3  . chlorhexidine (PERIDEX) 0.12 % solution Use as directed 15 mLs in the mouth or throat 2 (two) times daily. 120 mL 0  . clindamycin (CLEOCIN) 300 MG capsule Take 1 capsule (300 mg total) by mouth 2 (two) times daily. 14 capsule 0  . hydrOXYzine (VISTARIL) 50 MG capsule Take 50 mg by mouth 3 (three) times daily as needed.    . metFORMIN  (GLUCOPHAGE-XR) 500 MG 24 hr tablet Take 1-2 tablets (500-1,000 mg total) by mouth daily after supper. Start with 500 mg for one week, then increase to 1000 mg 60 tablet 2  . Multiple Vitamin (MULTIVITAMIN) tablet Take 1 tablet by mouth daily.    . sertraline (ZOLOFT) 100 MG tablet Take 100 mg by mouth daily.     No facility-administered medications prior to visit.     ROS Review of Systems  Constitutional: Positive for fatigue. Negative for chills and fever.  HENT: Negative for dental problem.   Eyes: Negative for visual disturbance.  Respiratory: Negative for shortness of breath.   Cardiovascular: Negative for chest pain.  Gastrointestinal: Negative for abdominal pain and blood in stool.  Musculoskeletal: Negative for arthralgias and back pain.  Skin: Negative for rash.  Allergic/Immunologic: Negative for immunocompromised state.  Hematological: Negative for adenopathy. Does not bruise/bleed easily.  Psychiatric/Behavioral: Positive for sleep disturbance. Negative for dysphoric mood and suicidal ideas.    Objective:  BP (!) 147/80   Pulse 85   Temp 97.9 F (36.6 C) (Oral)   Resp 16   Wt (!) 305 lb 9.6 oz (138.6 kg)   SpO2 96%   BMI 47.16 kg/m   BP/Weight 05/24/2016 04/09/2016 03/18/2016  Systolic BP 147 107 140  Diastolic BP 80 70 60  Wt. (Lbs) 305.6 309.8 -  BMI 47.16 47.81 -   Wt  Readings from Last 3 Encounters:  05/24/16 (!) 305 lb 9.6 oz (138.6 kg)  04/09/16 (!) 309 lb 12.8 oz (140.5 kg)  09/26/15 (!) 315 lb 12.8 oz (143.2 kg)   Physical Exam  Constitutional: She is oriented to person, place, and time. She appears well-developed and well-nourished. No distress.  Obese   HENT:  Head: Normocephalic and atraumatic.  Cardiovascular: Normal rate, regular rhythm, normal heart sounds and intact distal pulses.   Pulmonary/Chest: Effort normal and breath sounds normal.  Musculoskeletal: She exhibits no edema.  Neurological: She is alert and oriented to person, place, and  time.  Skin: Skin is warm and dry. No rash noted.  Psychiatric: She has a normal mood and affect.   Lab Results  Component Value Date   HGBA1C 5.0 04/09/2016   CBG 99 Assessment & Plan:  Kristen [name] was seen today for follow-up and fatigue.  Diagnoses and all orders for this visit:  PCOS (polycystic ovarian syndrome) -     US Pelvis Complete; Future -     US Transvaginal Non-OB; Future -     TSH+Prl+FSH+TestT+LH+DHEA S... -     Insulin-like growth factor  Encounter for screening for HIV -     HIV antibody (with reflex)  Restless legs -     Iron and TIBC -     Ferritin   There are no diagnoses linked to this encounter.  No orders of the defined types were placed in this encounter.   Follow-up: Return in about 6 weeks (around 07/05/2016) for PCOS .   Dessa Phi MD

## 2016-05-27 ENCOUNTER — Ambulatory Visit (HOSPITAL_COMMUNITY)
Admission: RE | Admit: 2016-05-27 | Discharge: 2016-05-27 | Disposition: A | Discharge status: Home / Self Care | Payer: Self-pay | Source: Ambulatory Visit | Attending: Family Medicine | Admitting: Family Medicine

## 2016-05-27 DIAGNOSIS — N8301 Follicular cyst of right ovary: Secondary | ICD-10-CM | POA: Insufficient documentation

## 2016-05-27 DIAGNOSIS — N926 Irregular menstruation, unspecified: Secondary | ICD-10-CM | POA: Insufficient documentation

## 2016-05-27 DIAGNOSIS — E282 Polycystic ovarian syndrome: Secondary | ICD-10-CM | POA: Insufficient documentation

## 2016-05-27 DIAGNOSIS — E669 Obesity, unspecified: Secondary | ICD-10-CM | POA: Insufficient documentation

## 2016-05-29 ENCOUNTER — Telehealth: Payer: Self-pay

## 2016-05-29 NOTE — Telephone Encounter (Signed)
Pt was called and informed of lab results and ultrasound results. 

## 2016-05-31 LAB — TSH+PRL+FSH+TESTT+LH+DHEA S...
17 HYDROXYPROGESTERONE: 93 ng/dL
ANDROSTENEDIONE: 97 ng/dL (ref 41–262)
DHEA-SO4: 238.4 ug/dL (ref 84.8–378.0)
FSH: 9.2 m[IU]/mL
LH: 26.7 m[IU]/mL
Prolactin: 27.6 ng/mL — ABNORMAL HIGH (ref 4.8–23.3)
TESTOSTERONE: 35 ng/dL (ref 8–48)
TSH: 5.92 u[IU]/mL — ABNORMAL HIGH (ref 0.450–4.500)
Testosterone, Free: 2.5 pg/mL (ref 0.0–4.2)

## 2016-05-31 LAB — INSULIN-LIKE GROWTH FACTOR: Insulin-Like GF-1: 139 ng/mL (ref 78–270)

## 2016-05-31 LAB — IRON AND TIBC
IRON SATURATION: 14 % — AB (ref 15–55)
Iron: 43 ug/dL (ref 27–159)
TIBC: 302 ug/dL (ref 250–450)
UIBC: 259 ug/dL (ref 131–425)

## 2016-05-31 LAB — HIV ANTIBODY (ROUTINE TESTING W REFLEX): HIV SCREEN 4TH GENERATION: NONREACTIVE

## 2016-05-31 LAB — FERRITIN: FERRITIN: 57 ng/mL (ref 15–150)

## 2016-06-13 NOTE — Addendum Note (Signed)
Addended by: Dessa PhiFUNCHES, Jahvier Aldea on: 06/13/2016 03:27 PM   Modules accepted: Orders

## 2016-06-19 ENCOUNTER — Encounter: Payer: Self-pay | Admitting: Family Medicine

## 2016-06-19 ENCOUNTER — Telehealth: Payer: Self-pay

## 2016-06-19 NOTE — Telephone Encounter (Signed)
Pt was called and informed of lab results, pt also has fasting lab appointment on 5/17

## 2016-06-20 ENCOUNTER — Ambulatory Visit: Payer: Self-pay | Attending: Family Medicine

## 2016-06-20 DIAGNOSIS — Z0000 Encounter for general adult medical examination without abnormal findings: Secondary | ICD-10-CM | POA: Insufficient documentation

## 2016-06-20 NOTE — Progress Notes (Signed)
Patient here for lab visit only 

## 2016-06-21 LAB — T3, FREE: T3, Free: 3.1 pg/mL (ref 2.0–4.4)

## 2016-06-21 LAB — TSH: TSH: 2.39 u[IU]/mL (ref 0.450–4.500)

## 2016-06-21 LAB — T4, FREE: Free T4: 1.31 ng/dL (ref 0.82–1.77)

## 2016-06-21 LAB — PROLACTIN: Prolactin: 32.9 ng/mL — ABNORMAL HIGH (ref 4.8–23.3)

## 2016-07-03 ENCOUNTER — Telehealth: Payer: Self-pay

## 2016-07-03 ENCOUNTER — Telehealth: Payer: Self-pay | Admitting: *Deleted

## 2016-07-03 NOTE — Telephone Encounter (Addendum)
[facility].  Urine HCG performed Urine HCG - negative Aware of the need for f/u appt.

## 2016-07-03 NOTE — Telephone Encounter (Signed)
Pt mother called and was informed lab results.

## 2016-07-05 ENCOUNTER — Encounter: Payer: Self-pay | Admitting: Family Medicine

## 2016-07-05 ENCOUNTER — Ambulatory Visit: Payer: Self-pay | Attending: Family Medicine | Admitting: Family Medicine

## 2016-07-05 VITALS — BP 127/81 | HR 87 | Temp 98.0°F | Wt 306.6 lb

## 2016-07-05 DIAGNOSIS — E229 Hyperfunction of pituitary gland, unspecified: Secondary | ICD-10-CM

## 2016-07-05 DIAGNOSIS — R7989 Other specified abnormal findings of blood chemistry: Secondary | ICD-10-CM

## 2016-07-05 DIAGNOSIS — E282 Polycystic ovarian syndrome: Secondary | ICD-10-CM | POA: Insufficient documentation

## 2016-07-05 DIAGNOSIS — K0889 Other specified disorders of teeth and supporting structures: Secondary | ICD-10-CM | POA: Insufficient documentation

## 2016-07-05 DIAGNOSIS — Z7984 Long term (current) use of oral hypoglycemic drugs: Secondary | ICD-10-CM | POA: Insufficient documentation

## 2016-07-05 DIAGNOSIS — Z79899 Other long term (current) drug therapy: Secondary | ICD-10-CM | POA: Insufficient documentation

## 2016-07-05 DIAGNOSIS — N9089 Other specified noninflammatory disorders of vulva and perineum: Secondary | ICD-10-CM | POA: Insufficient documentation

## 2016-07-05 LAB — POCT URINE PREGNANCY: Preg Test, Ur: NEGATIVE

## 2016-07-05 MED ORDER — LORAZEPAM 0.5 MG PO TABS: ORAL_TABLET | ORAL | 0 refills | Status: DC

## 2016-07-05 NOTE — Progress Notes (Signed)
Subjective:  Patient ID: Kristen [name], female    DOB: 06/01/1985  Age: 31 y.o. MRN: [id number]  CC: Follow-up   HPI Kristen LaserStephanie Y Staton has PCOS, social phobia, morbid obesity she  presents for   1. PCOS: menarche at age 31. She reports her menstrual periods have become more irregular with age. She usually has a period every month.  Periods are heavy and painful. Bleeding last from 1-2 weeks.  She is attempting to lose weight. She desires children. She has prolonged and heavy periods. She reports having a pelvic ultrasound in 2011 at [facility] for heavy menstrual bleeding following missed period for 2 months.    She has never had a positive pregnancy test.  Mom has uterine fibroids and hypothyroidism.  She has a maternal cousin who has history of cervical cancer.   2. Elevated prolactin level: slightly elevated prolactin level x 2. Negative U preg. She is on Zoloft. She does admit to headaches for two months off an on. Worsening over past 3 weeks. Worse behind eyes and mid forehead. She reports blurry vision that is intermittently blurry and visual field jumps. No loss in peripheral vision. No excessive nausea or vomiting. She admits milky nipple discharge off and on for 5 months from both breast.   3. Upper back pain: in between shoulders x 2 days. She has been sitting more than usual. No heavy lifting. She has had similar pain in the past. She has kyphosis.she lies on a back board for 30 minutes which helps.   4. Vulvar lesion: comes and goes since onset of menses at age 31. Occurs during menses but not every menses. Tender. Has 1 sexual partner who also had 1 sexual partner. No sores in any other area.    Social History  Substance Use Topics  . Smoking status: Never Smoker  . Smokeless tobacco: Never Used  . Alcohol use No    Outpatient Medications Prior to Visit  Medication Sig Dispense Refill  . albuterol (PROVENTIL HFA;VENTOLIN HFA) 108 (90 BASE) MCG/ACT inhaler Inhale 2  puffs into the lungs every 6 (six) hours as needed for wheezing or shortness of breath. 6.7 g 3  . chlorhexidine (PERIDEX) 0.12 % solution Use as directed 15 mLs in the mouth or throat 2 (two) times daily. 120 mL 0  . hydrOXYzine (VISTARIL) 50 MG capsule Take 50 mg by mouth 3 (three) times daily as needed.    . metFORMIN (GLUCOPHAGE-XR) 500 MG 24 hr tablet Take 1-2 tablets (500-1,000 mg total) by mouth daily after supper. Start with 500 mg for one week, then increase to 1000 mg 60 tablet 2  . Multiple Vitamin (MULTIVITAMIN) tablet Take 1 tablet by mouth daily.    . sertraline (ZOLOFT) 100 MG tablet Take 100 mg by mouth daily.     No facility-administered medications prior to visit.     ROS Review of Systems  Constitutional: Positive for fatigue. Negative for chills and fever.  HENT: Negative for dental problem.   Eyes: Positive for visual disturbance.  Respiratory: Negative for shortness of breath.   Cardiovascular: Negative for chest pain.  Gastrointestinal: Negative for abdominal pain and blood in stool.  Genitourinary: Positive for genital sores.  Musculoskeletal: Positive for back pain. Negative for arthralgias.  Skin: Negative for rash.  Allergic/Immunologic: Negative for immunocompromised state.  Neurological: Positive for headaches.  Hematological: Negative for adenopathy. Does not bruise/bleed easily.  Psychiatric/Behavioral: Positive for sleep disturbance. Negative for dysphoric mood and suicidal ideas.  Objective:  BP 127/81   Pulse 87   Temp 98 F (36.7 C) (Oral)   Wt (!) 306 lb 9.6 oz (139.1 kg)   SpO2 98%   BMI 47.31 kg/m   BP/Weight 07/05/2016 05/24/2016 04/09/2016  Systolic BP 127 147 107  Diastolic BP 81 80 70  Wt. (Lbs) 306.6 305.6 309.8  BMI 47.31 47.16 47.81   Wt Readings from Last 3 Encounters:  07/05/16 (!) 306 lb 9.6 oz (139.1 kg)  05/24/16 (!) 305 lb 9.6 oz (138.6 kg)  04/09/16 (!) 309 lb 12.8 oz (140.5 kg)   Physical Exam  Constitutional: She is  oriented to person, place, and time. She appears well-developed and well-nourished. No distress.  Obese   HENT:  Head: Normocephalic and atraumatic.  Cardiovascular: Normal rate, regular rhythm, normal heart sounds and intact distal pulses.   Pulmonary/Chest: Effort normal and breath sounds normal.  Genitourinary:     Musculoskeletal: She exhibits no edema.  Neurological: She is alert and oriented to person, place, and time.  Skin: Skin is warm and dry. No rash noted.  Psychiatric: She has a normal mood and affect.   Lab Results  Component Value Date   HGBA1C 5.0 04/09/2016   U preg: negative Assessment & Plan:  Nolyn was seen today for follow-up.  Diagnoses and all orders for this visit:  PCOS (polycystic ovarian syndrome)  Elevated prolactin level (HCC) -     POCT urine pregnancy -     hCG, serum, qualitative -     CMP and Liver -     CBC -     MR BRAIN W CONTRAST; Future -     LORazepam (ATIVAN) 0.5 MG tablet; Take 1 or 2 tablets 30 minutes prior to MRI  Vulvar lesion -     Ambulatory referral to Gynecology  Pain, dental -     Ambulatory referral to Dentistry   There are no diagnoses linked to this encounter.  No orders of the defined types were placed in this encounter.   Follow-up: Return in about 6 weeks (around 08/16/2016) for elevated prolactin.   Dessa Phi MD

## 2016-07-05 NOTE — Assessment & Plan Note (Signed)
Mildly elevated prolactin Report of nipple discharge, headache, vision changes without peripheral vision loss Negative U preg Patient taking Zoloft   Plan: MR brain with contrast

## 2016-07-05 NOTE — Patient Instructions (Addendum)
Kristen [name] was seen today for follow-up.  Diagnoses and all orders for this visit:  PCOS (polycystic ovarian syndrome)  Elevated prolactin level (HCC) -     POCT urine pregnancy -     hCG, serum, qualitative -     CMP and Liver -     CBC -     MR BRAIN W CONTRAST; Future  Vulvar lesion -     Ambulatory referral to Gynecology  Pain, dental -     Ambulatory referral to Dentistry  you will be called with MRI results   F/u in  6 weeks for elevated prolactin level   Dr. Armen PickupFunches

## 2016-07-06 LAB — CMP AND LIVER
ALT: 22 IU/L (ref 0–32)
AST: 23 IU/L (ref 0–40)
Albumin: 4.3 g/dL (ref 3.5–5.5)
Alkaline Phosphatase: 118 IU/L — ABNORMAL HIGH (ref 39–117)
BILIRUBIN, DIRECT: 0.15 mg/dL (ref 0.00–0.40)
BUN: 10 mg/dL (ref 6–20)
Bilirubin Total: 0.6 mg/dL (ref 0.0–1.2)
CO2: 21 mmol/L (ref 18–29)
Calcium: 9.4 mg/dL (ref 8.7–10.2)
Chloride: 105 mmol/L (ref 96–106)
Creatinine, Ser: 0.86 mg/dL (ref 0.57–1.00)
GFR, EST AFRICAN AMERICAN: 106 mL/min/{1.73_m2} (ref >59)
GFR, EST NON AFRICAN AMERICAN: 92 mL/min/{1.73_m2} (ref >59)
GLUCOSE: 92 mg/dL (ref 65–99)
Potassium: 4.4 mmol/L (ref 3.5–5.2)
SODIUM: 141 mmol/L (ref 134–144)
TOTAL PROTEIN: 6.7 g/dL (ref 6.0–8.5)

## 2016-07-06 LAB — HCG, SERUM, QUALITATIVE: hCG,Beta Subunit,Qual,Serum: NEGATIVE m[IU]/mL (ref <6)

## 2016-07-06 LAB — CBC
HEMATOCRIT: 44.2 % (ref 34.0–46.6)
Hemoglobin: 14.2 g/dL (ref 11.1–15.9)
MCH: 28 pg (ref 26.6–33.0)
MCHC: 32.1 g/dL (ref 31.5–35.7)
MCV: 87 fL (ref 79–97)
Platelets: 229 10*3/uL (ref 150–379)
RBC: 5.08 x10E6/uL (ref 3.77–5.28)
RDW: 13.8 % (ref 12.3–15.4)
WBC: 10.5 10*3/uL (ref 3.4–10.8)

## 2016-07-08 ENCOUNTER — Telehealth: Payer: Self-pay | Admitting: Family Medicine

## 2016-07-08 NOTE — Telephone Encounter (Signed)
Patient called requesting results, please f/up °

## 2016-07-09 NOTE — Telephone Encounter (Signed)
Pt was called and informed of lab results. 

## 2016-07-09 NOTE — Telephone Encounter (Signed)
Peggy from [facility] called stating that pt. PCP put in an order for an MRI and for the brain with contrast. Rep states that the order needs to be changed to MRI with and with out. She states that the pt. Has an appt. Tomorrow 07/10/16. Please f/u

## 2016-07-09 NOTE — Addendum Note (Signed)
Addended by: Dessa PhiFUNCHES, Tieshia Rettinger on: 07/09/2016 05:53 PM   Modules accepted: Orders

## 2016-07-09 NOTE — Telephone Encounter (Signed)
New MRI ordered placed Please inform patient The other one has already been scheduled, so I could not cancel it

## 2016-07-10 ENCOUNTER — Encounter (HOSPITAL_COMMUNITY): Payer: Self-pay

## 2016-07-10 ENCOUNTER — Ambulatory Visit (HOSPITAL_COMMUNITY)
Admission: RE | Admit: 2016-07-10 | Discharge: 2016-07-10 | Disposition: A | Discharge status: Home / Self Care | Payer: Self-pay | Source: Ambulatory Visit | Attending: Family Medicine | Admitting: Family Medicine

## 2016-07-10 DIAGNOSIS — E229 Hyperfunction of pituitary gland, unspecified: Principal | ICD-10-CM

## 2016-07-10 DIAGNOSIS — R7989 Other specified abnormal findings of blood chemistry: Secondary | ICD-10-CM

## 2016-07-10 NOTE — Telephone Encounter (Signed)
Pt is ok to do MRI today at 7

## 2016-07-11 NOTE — Telephone Encounter (Signed)
Will route to PCP 

## 2016-07-11 NOTE — Telephone Encounter (Signed)
Please look around to find open MRI

## 2016-07-11 NOTE — Telephone Encounter (Signed)
Pt. Daughter called stating that pt. Was going to have an MRI done but pt. Did not fit b/c her breast and shoulders are to big. Pt. Was recommended to have an open MRI but [facility] not [facility] an [facility]. Please f/u.

## 2016-07-12 NOTE — Telephone Encounter (Signed)
Pt. mother called back stating that an order needs to be put to [facility] so that pt. Can get her MRI. Please f/u.

## 2016-07-12 NOTE — Telephone Encounter (Signed)
Pt was called and a VM was left informing pt of phone number for [facility] to schedule her MRI.

## 2016-07-12 NOTE — Telephone Encounter (Signed)
Pt. Mother called stating that she found an open MRI. The facility is called [facility] Please f/u

## 2016-07-14 ENCOUNTER — Other Ambulatory Visit: Payer: Self-pay | Admitting: Family Medicine

## 2016-07-14 ENCOUNTER — Ambulatory Visit
Admission: RE | Admit: 2016-07-14 | Discharge: 2016-07-14 | Disposition: A | Discharge status: Home / Self Care | Payer: Self-pay | Source: Ambulatory Visit | Attending: Family Medicine | Admitting: Family Medicine

## 2016-07-14 DIAGNOSIS — E229 Hyperfunction of pituitary gland, unspecified: Principal | ICD-10-CM

## 2016-07-14 DIAGNOSIS — R7989 Other specified abnormal findings of blood chemistry: Secondary | ICD-10-CM

## 2016-07-14 MED ORDER — GADOBENATE DIMEGLUMINE 529 MG/ML IV SOLN
20.0000 mL | Freq: Once | INTRAVENOUS | Status: AC | PRN
  Administered 2016-07-14: 20 mL via INTRAVENOUS

## 2016-07-14 MED ORDER — GADOBENATE DIMEGLUMINE 529 MG/ML IV SOLN: 20.0000 mL | Freq: Once | INTRAVENOUS | Status: DC | PRN

## 2016-07-16 ENCOUNTER — Telehealth: Payer: Self-pay | Admitting: Family Medicine

## 2016-07-16 DIAGNOSIS — E230 Hypopituitarism: Secondary | ICD-10-CM

## 2016-07-16 DIAGNOSIS — R7989 Other specified abnormal findings of blood chemistry: Secondary | ICD-10-CM

## 2016-07-16 DIAGNOSIS — E229 Hyperfunction of pituitary gland, unspecified: Secondary | ICD-10-CM

## 2016-07-16 NOTE — Telephone Encounter (Signed)
PT mother called to find out the result for her daughter MRI, please call her back

## 2016-07-17 NOTE — Telephone Encounter (Signed)
Will forward to pcp

## 2016-07-17 NOTE — Telephone Encounter (Signed)
Called back to patient her MRI results Her mother, Kristen [name], answered Requested patient. 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 Patient's mother asked for results.  Advised that I must give results to patient directly unless patient gives consent to have results released to her mother.  Her mother, Kristen [name], voiced understanding and will see if patient can call me back.  Gave my direct # in the office [phone]  Kristen CornfieldStephanie called back. I conferenced her mother in.  Gave patient MRI results, plan and answered questions.  She agrees with plan and voiced understanding  MRI brain with and without contrast reveals no lesion in the pituitary gland.  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 Also increase prolactin levels cause galactorrhea (nipple discharge) and amenorrhea (loss of period)  I have reached out to neurology on call Kristen [name] who advises against starting high dose steroids right now and recommends that neurosurgery evaluates the scan first.    I called Kristen [name], neurosurgery on call who recommends patient be seen in the office. His office is [facility] in [place], [place]. He recommends she see one of his partners, Kristen [name]. He informs me that surgery is unlikely but she should be seen by neurosurgery and endocrinology. She will be contacted from their office with appointment details.

## 2016-07-18 ENCOUNTER — Ambulatory Visit (HOSPITAL_COMMUNITY): Payer: Self-pay

## 2016-07-19 ENCOUNTER — Telehealth: Payer: Self-pay | Admitting: Family Medicine

## 2016-07-19 DIAGNOSIS — R51 Headache: Principal | ICD-10-CM

## 2016-07-19 DIAGNOSIS — R7989 Other specified abnormal findings of blood chemistry: Secondary | ICD-10-CM

## 2016-07-19 DIAGNOSIS — E230 Hypopituitarism: Secondary | ICD-10-CM

## 2016-07-19 DIAGNOSIS — E229 Hyperfunction of pituitary gland, unspecified: Secondary | ICD-10-CM

## 2016-07-19 DIAGNOSIS — R519 Headache, unspecified: Secondary | ICD-10-CM

## 2016-07-19 MED ORDER — NAPROXEN 500 MG PO TABS: 500.0000 mg | ORAL_TABLET | Freq: Two times a day (BID) | ORAL | 0 refills | Status: DC

## 2016-07-19 MED FILL — NAPROXEN 500 MG TABLET: 500 | 15 days supply | Qty: 30 | Fill #0

## 2016-07-19 NOTE — Telephone Encounter (Signed)
Pt's mom's  Keenan BachelorBrenda Zingale is concern because the office of Dr Lucy ChrisSteven Cook [facility] call then to schedule an appointment 07-30-16 but they have to be self pay the first appointment is $100 and the rest  Will be payment plans and they can afford it . Mrs Katrinka BlazingSmith wants to know if her daughter can go to [facility] since they are [facility] of [facility].

## 2016-07-19 NOTE — Addendum Note (Signed)
Addended by: Dessa PhiFUNCHES, Skylene Deremer on: 07/19/2016 05:28 PM   Modules accepted: Orders

## 2016-07-19 NOTE — Telephone Encounter (Signed)
Received a call from  Dr. Adriana Simasook.  He reviewed Kristen [name]'s scan he recommended that due to the lack of mass effect and mild elevation in prolactin level x 2 surgical treatment will most likely not be needed.   From his standpoint she does not need to see him at this time, neurosurgery.   He recommends the following: 1. Fasting AM cortisol 2. Endocrinology evaluation with most likely prednisone treatment 3. ophthalmology  evaluation for baseline for baseline visual acuity and visual field testing 4. LP to rule out infectious or inflammatory cause of hypophysitis (of note LP can be ordered on outpatient basis DG fluoroscopic LP)  I have decided defer this to neurology.

## 2016-07-19 NOTE — Telephone Encounter (Signed)
[facility]  Dr. Adriana Simasook in [place] is neurosurgery. They were the group on call who gave me the recommendations. Patient needs neurosurgery not neurology.   She reports patient is having headache that is bad she had some associated nausea and vomiting. No fever.  Advised NSAID Work on Product managercalming techniques  Also received a call from Dr. Lucy ChrisSteven Cook  [phone] Called back and left VM requesting a call back

## 2016-07-25 ENCOUNTER — Telehealth: Payer: Self-pay | Admitting: Neurology

## 2016-07-25 DIAGNOSIS — F419 Anxiety disorder, unspecified: Secondary | ICD-10-CM

## 2016-07-25 DIAGNOSIS — R7989 Other specified abnormal findings of blood chemistry: Secondary | ICD-10-CM

## 2016-07-25 DIAGNOSIS — E229 Hyperfunction of pituitary gland, unspecified: Principal | ICD-10-CM

## 2016-07-25 DIAGNOSIS — E230 Hypopituitarism: Secondary | ICD-10-CM

## 2016-07-25 NOTE — Telephone Encounter (Signed)
Here it is

## 2016-07-25 NOTE — Telephone Encounter (Signed)
Called to patient Spoke to her mother Patient has given permission to speak with her mother in the past as she is at an internship daily.  I updated her mother on the following  I spoke to Dr. Adriana Simasook.  He reviewed Aeryn's scan he recommended that due to the lack of mass effect and mild elevation in prolactin level x 2 surgical treatment will most likely not be needed.   From his standpoint she does not need to see him at this time, neurosurgery.   He recommends the following: 1. Fasting AM cortisol 2. Endocrinology evaluation with most likely prednisone treatment 3. ophthalmology  evaluation for baseline for baseline visual acuity and visual field testing 4. LP to rule out infectious or inflammatory cause of hypophysitis (of note LP can be ordered on outpatient basis DG fluoroscopic LP)   Patient has neurology appt but earliest available is 09/2016 with Dr. Lucia GaskinsAhern.  I have reached out to Dr. Lucia GaskinsAhern for recommendations regarding LP.   Specifically do you agree with the recommendation to obtain one? Also would recommend any testing other than those pended in the order? CSF cell count w/ diff, culture, gram strain, protein and  glucose, oligo clonal bands and cytology non pap?

## 2016-07-25 NOTE — Addendum Note (Signed)
Addended by: Dessa PhiFUNCHES, Dakiyah Heinke on: 07/25/2016 01:44 PM   Modules accepted: Orders

## 2016-07-25 NOTE — Telephone Encounter (Signed)
Kristen [name], doesn;t look like we have seen this patient can you figute this out?

## 2016-07-25 NOTE — Telephone Encounter (Signed)
Dr. Armen PickupFunches, I think this is a good workup. You can ask them to save 4ml of csf if more testing needs to be added. Thanks,

## 2016-07-25 NOTE — Telephone Encounter (Signed)
Dr Rockwell GermanySunches [phone] called wanting to speak with you re: some potential testing that should be ran on pt, she said she will message you directly through Epic if you would like to speak with her directly please call her on the # above which is her cell#

## 2016-07-25 NOTE — Telephone Encounter (Signed)
Called to patient Spoke to her mother Patient has given permission to speak with her mother in the past as she is at an internship daily.  I updated her mother on the following  I spoke to Dr. Cook.  He reviewed Mahnoor's scan he recommended that due to the lack of mass effect and mild elevation in prolactin level x 2 surgical treatment will most likely not be needed.   From his standpoint she does not need to see him at this time, neurosurgery.   He recommends the following: 1. Fasting AM cortisol 2. Endocrinology evaluation with most likely prednisone treatment 3. ophthalmology  evaluation for baseline for baseline visual acuity and visual field testing 4. LP to rule out infectious or inflammatory cause of hypophysitis (of note LP can be ordered on outpatient basis DG fluoroscopic LP)   Patient has neurology appt but earliest available is 09/2016 with Dr. Ahern.  I have reached out to Dr. Ahern for recommendations regarding LP.   Specifically do you agree with the recommendation to obtain one? Also would recommend any testing other than those pended in the order? CSF cell count w/ diff, culture, gram strain, protein and  glucose, oligo clonal bands and cytology non pap?      

## 2016-08-15 DIAGNOSIS — E230 Hypopituitarism: Secondary | ICD-10-CM | POA: Insufficient documentation

## 2016-08-15 NOTE — Telephone Encounter (Signed)
Thank you for the recommendations Dr. Lucia GaskinsAhern. I will get her scheduled.

## 2016-08-15 NOTE — Addendum Note (Signed)
Addended by: Dessa PhiFUNCHES, Evamaria Detore on: 08/15/2016 05:16 PM   Modules accepted: Orders

## 2016-08-16 MED ORDER — HYDROXYZINE PAMOATE 50 MG PO CAPS: 50.0000 mg | ORAL_CAPSULE | Freq: Three times a day (TID) | ORAL | 0 refills | Status: DC | PRN

## 2016-08-16 MED ORDER — LORAZEPAM 0.5 MG PO TABS: 0.5000 mg | ORAL_TABLET | Freq: Three times a day (TID) | ORAL | 0 refills | Status: DC | PRN

## 2016-08-16 NOTE — Telephone Encounter (Signed)
Called to schedule LP for patient   Friday August 23, 2016 Arrive 1:45 PM Appointment at 2:00 PM  No aspirin or NSAID on day of procedure  [address] [place] [address]  [phone]  Called patient gave appointment details to her mother Also stressed no aspirin or NSAID on day of procedure She reports patient is complaining of headache and not being able to see well Patient has anxiety as baseline. She is more anxious  Plan: Refilled vistaril Refilled a few ativan

## 2016-08-16 NOTE — Addendum Note (Signed)
Addended by: Dessa PhiFUNCHES, Payam Gribble on: 08/16/2016 08:44 AM   Modules accepted: Orders

## 2016-08-21 ENCOUNTER — Other Ambulatory Visit: Payer: Self-pay | Admitting: General Surgery

## 2016-08-23 ENCOUNTER — Ambulatory Visit (HOSPITAL_COMMUNITY): Admission: RE | Admit: 2016-08-23 | Payer: Self-pay | Source: Ambulatory Visit

## 2016-08-23 ENCOUNTER — Ambulatory Visit (HOSPITAL_COMMUNITY)
Admission: RE | Admit: 2016-08-23 | Discharge: 2016-08-23 | Disposition: A | Discharge status: Home / Self Care | Payer: Self-pay | Source: Ambulatory Visit | Attending: Family Medicine | Admitting: Family Medicine

## 2016-08-23 DIAGNOSIS — E229 Hyperfunction of pituitary gland, unspecified: Secondary | ICD-10-CM | POA: Insufficient documentation

## 2016-08-23 DIAGNOSIS — R7989 Other specified abnormal findings of blood chemistry: Secondary | ICD-10-CM

## 2016-08-23 DIAGNOSIS — E230 Hypopituitarism: Secondary | ICD-10-CM | POA: Insufficient documentation

## 2016-08-23 LAB — CSF CELL COUNT WITH DIFFERENTIAL
RBC Count, CSF: 365 /mm3 — ABNORMAL HIGH
TUBE #: 1
WBC CSF: 0 /mm3 (ref 0–5)

## 2016-08-23 LAB — GLUCOSE, CSF: GLUCOSE CSF: 55 mg/dL (ref 40–70)

## 2016-08-23 LAB — PROTEIN, CSF: Total  Protein, CSF: 24 mg/dL (ref 15–45)

## 2016-08-23 MED ORDER — LIDOCAINE HCL (PF) 1 % IJ SOLN
5.0000 mL | Freq: Once | INTRAMUSCULAR | Status: AC
  Administered 2016-08-23: 5 mL via INTRADERMAL

## 2016-08-23 NOTE — Discharge Instructions (Signed)
Lumbar Puncture, Care After °Refer to this sheet in the next few weeks. These instructions provide you with information on caring for yourself after your procedure. Your health care provider may also give you more specific instructions. Your treatment has been planned according to current medical practices, but problems sometimes occur. Call your health care provider if you have any problems or questions after your procedure. °What can I expect after the procedure? °After your procedure, it is typical to have the following sensations: °· Mild discomfort or pain at the insertion site. °· Mild headache that is relieved with pain medicines. ° °Follow these instructions at home: ° °· Avoid lifting anything heavier than 10 lb (4.5 kg) for at least 12 hours after the procedure. °· Drink enough fluids to keep your urine clear or pale yellow. °Contact a health care provider if: °· You have fever or chills. °· You have nausea or vomiting. °· You have a headache that lasts for more than 2 days. °Get help right away if: °· You have any numbness or tingling in your legs. °· You are unable to control your bowel or bladder. °· You have bleeding or swelling in your back at the insertion site. °· You are dizzy or faint. °This information is not intended to replace advice given to you by your health care provider. Make sure you discuss any questions you have with your health care provider. °Document Released: 01/26/2013 Document Revised: 06/29/2015 Document Reviewed: 09/29/2012 °Elsevier Interactive Patient Education © 2017 [employer] ° °

## 2016-08-23 NOTE — Procedures (Signed)
Radiology  Uncomplicated LP at L4/5 using fluoro.  9 cc sent to lab.  JWatts MD

## 2016-08-26 ENCOUNTER — Telehealth: Payer: Self-pay | Admitting: Family Medicine

## 2016-08-26 LAB — CSF CULTURE: GRAM STAIN: NONE SEEN

## 2016-08-26 LAB — CSF CULTURE W GRAM STAIN: Culture: NO GROWTH

## 2016-08-26 MED ORDER — BUTALBITAL-APAP-CAFFEINE 50-300-40 MG PO CAPS: 1.0000 | ORAL_CAPSULE | Freq: Four times a day (QID) | ORAL | 0 refills | Status: DC | PRN

## 2016-08-26 NOTE — Telephone Encounter (Signed)
Called patient Verified name Gave preliminary LP results to her  All normal Blood is common with LP from the puncture  She reports headache, nausea No emesis She reports intermittent numbness in 4th and  5th fingers of both hands  Headache is moderate to severe Naproxen is not helping Sent in Fioricet  Advised patient call if Fioricet is not helping She agrees with recommendations

## 2016-08-27 LAB — OLIGOCLONAL BANDS, CSF + SERM

## 2016-08-29 ENCOUNTER — Other Ambulatory Visit: Payer: Self-pay | Admitting: Family Medicine

## 2016-08-29 DIAGNOSIS — R51 Headache: Principal | ICD-10-CM

## 2016-08-29 DIAGNOSIS — R519 Headache, unspecified: Secondary | ICD-10-CM

## 2016-09-03 ENCOUNTER — Ambulatory Visit: Payer: Self-pay | Attending: Family Medicine

## 2016-09-05 ENCOUNTER — Encounter: Payer: Self-pay | Admitting: Neurology

## 2016-09-05 ENCOUNTER — Ambulatory Visit (INDEPENDENT_AMBULATORY_CARE_PROVIDER_SITE_OTHER): Payer: Self-pay | Admitting: Neurology

## 2016-09-05 VITALS — BP 137/93 | HR 74 | Ht 66.5 in | Wt 301.4 lb

## 2016-09-05 DIAGNOSIS — E230 Hypopituitarism: Secondary | ICD-10-CM

## 2016-09-05 DIAGNOSIS — R0681 Apnea, not elsewhere classified: Secondary | ICD-10-CM

## 2016-09-05 DIAGNOSIS — R0683 Snoring: Secondary | ICD-10-CM

## 2016-09-05 DIAGNOSIS — G43011 Migraine without aura, intractable, with status migrainosus: Secondary | ICD-10-CM

## 2016-09-05 MED ORDER — PREDNISONE 20 MG PO TABS: ORAL_TABLET | ORAL | 3 refills | Status: DC

## 2016-09-05 MED ORDER — TOPIRAMATE 25 MG PO TABS: ORAL_TABLET | ORAL | 3 refills | Status: DC

## 2016-09-05 NOTE — Progress Notes (Signed)
[facility]    Provider:  Dr Jaynee Eagles Referring Provider: Boykin Nearing, MD Primary Care Physician:  Boykin Nearing, MD  CC:    HPI:  Kristen [name] is a 31 y.o. 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 She has PCOS and hyperprolactinemia. MRI of the brain was performed due to elevated prolactin. She ha headaches daily, migraines 10 days a month.Daily headaches are more dull an din the front of the forehead and sometimes pressure all around the head, head feels full, when she sometimes hears her heart beat, she feels there is something in her ears. The migraines feel like intense pressure across the forehead, sometimes throbbing, light and sound sensitivity, nausea. Feels like she has to vomit. Mother has migraines. Can be painful, can last all day up to 3 days. Didn't feel better after draining fluid. She sometimes wakes with headaches, she snores a lot heavily, mother has noticed she stops breathing in the middle of the night. She takes naproxen but not more than 10x a month, ice packs and quiet help. She has had a recent TB neg. she has blurry vision, no frank double vision although her vision does get blurry.No other focal neurologic deficits, associated symptoms, inciting events or modifiable factors.    Reviewed notes, labs and imaging from outside physicians, which showed:  Personally reviewed MRI of the brain images 07/14/2016 and agree with the following: 1. No focal pituitary lesion. 2. Thickening and enhancement of the pituitary stalk. 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 Pituitary adenoma can extending to the pituitary stalk. Granulomatous disease including neurosarcoidosis can have a similar appearance. No other basilar enhancement is evident to suggest granulomatous disease. Follow-up imaging after treatment could be performed for further evaluation.   Review of Systems: Patient complains of symptoms per  HPI as well as the following symptoms: Blurred vision, headaches. No nausea or vomiting. Pertinent negatives and positives per HPI. All others negative.   Social History   Social History  . Marital status: Single    Spouse name: N/A  . Number of children: 0  . Years of education: college    Occupational History  . Unemployed     Social History Main Topics  . Smoking status: Never Smoker  . Smokeless tobacco: Never Used  . Alcohol use No  . Drug use: No  . Sexual activity: Yes    Birth control/ protection: None   Other Topics Concern  . Not on file   Social History Narrative   Lives with mom [name])    Right-handed   Caffeine: 3 cups of coffee per day    Family History  Problem Relation Age of Onset  . Hypertension Mother   . Arnold-Chiari malformation Mother   . Restless legs syndrome Mother   . Osteoporosis Mother   . COPD Mother   . Asthma Mother   . Squamous cell carcinoma Mother        skin   . Eczema Mother   . Arthritis Mother   . Peripheral Artery Disease Mother   . Hypertension Father   . Scoliosis Father   . Bipolar disorder Father   . Congestive Heart Failure Father   . Diabetes Maternal Grandmother   . Hypertension Maternal Grandmother   . Cancer Paternal Grandmother        colon  . Bone cancer Maternal Grandfather 82       bone marrow   . Multiple myeloma Maternal Grandfather   .  Diabetes Paternal Grandfather     Past Medical History:  Diagnosis Date  . Anxiety 2013   treated at [facility] by Pauline Good   . Asthma 1989    no hospitalization, or intubation, previously on advair and singulair. asthma worsening.   . 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 Depression 2001   depressed since childhood   . 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 Kyphosis of thoracic region 2004    painful, runs in dad side   . Migraine   . Pancreatitis 2003   gallstone pancreatitis   . PCOS (polycystic ovarian syndrome) 2014   facial hair,  irregular periods, never been pregnant     Past Surgical History:  Procedure Laterality Date  . CHOLECYSTECTOMY  2003   . OPEN REDUCTION INTERNAL FIXATION (ORIF) PROXIMAL PHALANX Right 07/23/2013   Procedure: OPEN TREATMENT RIGHT RING FINGER, PROXIMAL INTERPHALANGEAL/JOINT FRACTURE DISLOCATION;  Surgeon: Jolyn Nap, MD;  Location: [facility];  Service: Orthopedics;  Laterality: Right;  . TONSILLECTOMY      Current Outpatient Prescriptions  Medication Sig Dispense Refill  . albuterol (PROVENTIL HFA;VENTOLIN HFA) 108 (90 BASE) MCG/ACT inhaler Inhale 2 puffs into the lungs every 6 (six) hours as needed for wheezing or shortness of breath. 6.7 g 3  . chlorhexidine (PERIDEX) 0.12 % solution Use as directed 15 mLs in the mouth or throat 2 (two) times daily. 120 mL 0  . hydrOXYzine (VISTARIL) 50 MG capsule Take 1 capsule (50 mg total) by mouth 3 (three) times daily as needed. 60 capsule 0  . LORazepam (ATIVAN) 0.5 MG tablet Take 1 tablet (0.5 mg total) by mouth every 8 (eight) hours as needed for anxiety. 20 tablet 0  . metFORMIN (GLUCOPHAGE-XR) 500 MG 24 hr tablet Take 1-2 tablets (500-1,000 mg total) by mouth daily after supper. Start with 500 mg for one week, then increase to 1000 mg 60 tablet 2  . Multiple Vitamin (MULTIVITAMIN) tablet Take 1 tablet by mouth daily.    . naproxen (NAPROSYN) 500 MG tablet TAKE 1 TABLET BY MOUTH 2 TIMES DAILY WITH A MEAL. 30 tablet 0  . sertraline (ZOLOFT) 100 MG tablet Take 100 mg by mouth daily.     No current facility-administered medications for this visit.     Allergies as of 09/05/2016 - Review Complete 09/05/2016  Allergen Reaction Noted  . Mucinex [guaifenesin er] Hives, Itching, and Swelling 08/27/2011  . Penicillins Swelling 08/23/2016  . Contrast media [iodinated diagnostic agents] Nausea And Vomiting 08/23/2016  . Multihance [gadobenate] Nausea And Vomiting 07/14/2016    Vitals: BP (!) 137/93   Pulse 74   Ht 5' 6.5"  (1.689 m)   Wt (!) 301 lb 6.4 oz (136.7 kg)   BMI 47.92 kg/m  Last Weight:  Wt Readings from Last 1 Encounters:  09/05/16 (!) 301 lb 6.4 oz (136.7 kg)   Last Height:   Ht Readings from Last 1 Encounters:  09/05/16 5' 6.5" (1.689 m)   Physical exam: Exam: Gen: NAD, conversant, well nourised, obese, well groomed                     CV: RRR, no MRG. No Carotid Bruits. No peripheral edema, warm, nontender Eyes: Conjunctivae clear without exudates or hemorrhage  Neuro: Detailed Neurologic Exam  Speech:    Speech is normal; fluent and spontaneous with normal comprehension.  Cognition:    The patient is oriented to  person, place, and time;     recent and remote memory intact;     language fluent;     normal attention, concentration,     fund of knowledge Cranial Nerves:    The pupils are equal, round, and reactive to light. The fundi are normal and spontaneous venous pulsations are present. Visual fields are full to finger confrontation. Extraocular movements are intact. Trigeminal sensation is intact and the muscles of mastication are normal. The face is symmetric. The palate elevates in the midline. Hearing intact. Voice is normal. Shoulder shrug is normal. The tongue has normal motion without fasciculations.   Coordination:    Normal finger to nose and heel to shin. Normal rapid alternating movements.   Gait:    Heel-toe and tandem gait are normal.   Motor Observation:    No asymmetry, no atrophy, and no involuntary movements noted. Tone:    Normal muscle tone.    Posture:    Posture is normal. normal erect    Strength:    Strength is V/V in the upper and lower limbs.      Sensation: intact to LT     Reflex Exam:  DTR's:    Deep tendon reflexes in the upper and lower extremities are normal bilaterally.   Toes:    The toes are downgoing bilaterally.   Clonus:    Clonus is absent.       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 CSF was normal (protein, glucose, cells, oligoclonal bands, culture, cytology). Will treat with an 8 week course of high-dose steroids with the taper and at that time we'll repeat imaging. Discussed steroid use and side effects, watch for insomnia, hyperglycemia, hypertension, fluid and weight gain, and other side effects as per patient instructions and call for anything concerning. - Snoring, gasps for air in the middle the night, extremely: Sleep eval for obstructive sleep apnea or hypoventilation syndrome - Morbid obesity: [facility] - Migraines: May be exacerbated by OSA, need MRI of brain and orbits to eval for IIH and interval improvement of lymphocytic hypophysitis after treatment. We'll start topiramate at this time. 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 DG Chest 4 View  . Pan-ANCA  . Angiotensin converting enzyme  . RPR  . Ambulatory referral to Sleep Studies  . Ambulatory referral to Family Practice    Cc: Boykin Nearing, MD   Sarina Ill, MD  [facility] [address] [place], [place] [address]  Phone [phone] Fax [phone]

## 2016-09-05 NOTE — Patient Instructions (Addendum)
Remember to drink plenty of fluid, eat healthy meals and do not skip any meals. Try to eat protein with a every meal and eat a healthy snack such as fruit or nuts in between meals. Try to keep a regular sleep-wake schedule and try to exercise daily, particularly in the form of walking, 20-30 minutes a day, if you can.   As far as your medications are concerned, I would like to suggest:  Prednisone 120mg  for 2 weeks  Then  100, 80, 60, 40, 20mg , 10mg   daily for one week each Topiramate 25mg  for one week then 50mg  Sleep study [facility]  I would like to see you back in 6 weeks, sooner if we need to. Please call us with any interim questions, concerns, problems, updates or refill requests.   Our phone number is [phone]. We also have an after hours call service for urgent matters and there is a physician on-call for urgent questions. For any emergencies you know to call 911 or go to the nearest emergency room  Topiramate tablets What is this medicine? TOPIRAMATE (toe PYRE a mate) is used to treat seizures in adults or children with epilepsy. It is also used for the prevention of migraine headaches. This medicine may be used for other purposes; ask your health care provider or pharmacist if you have questions. COMMON BRAND NAME(S): Topamax, Topiragen What should I tell my health care provider before I take this medicine? They need to know if you have any of these conditions: -bleeding disorders -cirrhosis of the liver or liver disease -diarrhea -glaucoma -kidney stones or kidney disease -low blood counts, like low white cell, platelet, or red cell counts -lung disease like asthma, obstructive pulmonary disease, emphysema -metabolic acidosis -on a ketogenic diet -schedule for surgery or a procedure -suicidal thoughts, plans, or attempt; a previous suicide attempt by you or a family member -an unusual or allergic reaction to topiramate, other medicines, foods,  dyes, or preservatives -pregnant or trying to get pregnant -breast-feeding How should I use this medicine? Take this medicine by mouth with a glass of water. Follow the directions on the prescription label. Do not crush or chew. You may take this medicine with meals. Take your medicine at regular intervals. Do not take it more often than directed. Talk to your pediatrician regarding the use of this medicine in children. Special care may be needed. While this drug may be prescribed for children as young as 772 years of age for selected conditions, precautions do apply. Overdosage: If you think you have taken too much of this medicine contact a poison control center or emergency room at once. NOTE: This medicine is only for you. Do not share this medicine with others. What if I miss a dose? If you miss a dose, take it as soon as you can. If your next dose is to be taken in less than 6 hours, then do not take the missed dose. Take the next dose at your regular time. Do not take double or extra doses. What may interact with this medicine? Do not take this medicine with any of the following medications: -probenecid This medicine may also interact with the following medications: -acetazolamide -alcohol -amitriptyline -aspirin and aspirin-like medicines -birth control pills -certain medicines for depression -certain medicines for seizures -certain medicines that treat or prevent blood clots like warfarin, enoxaparin, dalteparin, apixaban, dabigatran, and rivaroxaban -digoxin -hydrochlorothiazide -lithium -medicines for pain, sleep, or muscle relaxation -metformin -methazolamide -NSAIDS, medicines for pain and  inflammation, like ibuprofen or naproxen -pioglitazone -risperidone This list may not describe all possible interactions. Give your health care provider a list of all the medicines, herbs, non-prescription drugs, or dietary supplements you use. Also tell them if you smoke, drink alcohol,  or use illegal drugs. Some items may interact with your medicine. What should I watch for while using this medicine? Visit your doctor or health care professional for regular checks on your progress. Do not stop taking this medicine suddenly. This increases the risk of seizures if you are using this medicine to control epilepsy. Wear a medical identification bracelet or chain to say you have epilepsy or seizures, and carry a card that lists all your medicines. This medicine can decrease sweating and increase your body temperature. Watch for signs of deceased sweating or fever, especially in children. Avoid extreme heat, hot baths, and saunas. Be careful about exercising, especially in hot weather. Contact your health care provider right away if you notice a fever or decrease in sweating. You should drink plenty of fluids while taking this medicine. If you have had kidney stones in the past, this will help to reduce your chances of forming kidney stones. If you have stomach pain, with nausea or vomiting and yellowing of your eyes or skin, call your doctor immediately. You may get drowsy, dizzy, or have blurred vision. Do not drive, use machinery, or do anything that needs mental alertness until you know how this medicine affects you. To reduce dizziness, do not sit or stand up quickly, especially if you are an older patient. Alcohol can increase drowsiness and dizziness. Avoid alcoholic drinks. If you notice blurred vision, eye pain, or other eye problems, seek medical attention at once for an eye exam. The use of this medicine may increase the chance of suicidal thoughts or actions. Pay special attention to how you are responding while on this medicine. Any worsening of mood, or thoughts of suicide or dying should be reported to your health care professional right away. This medicine may increase the chance of developing metabolic acidosis. If left untreated, this can cause kidney stones, bone disease, or  slowed growth in children. Symptoms include breathing fast, fatigue, loss of appetite, irregular heartbeat, or loss of consciousness. Call your doctor immediately if you experience any of these side effects. Also, tell your doctor about any surgery you plan on having while taking this medicine since this may increase your risk for metabolic acidosis. Birth control pills may not work properly while you are taking this medicine. Talk to your doctor about using an extra method of birth control. Women who become pregnant while using this medicine may enroll in the [place] Antiepileptic Drug Pregnancy Registry by calling [phone]. This registry collects information about the safety of antiepileptic drug use during pregnancy. What side effects may I notice from receiving this medicine? Side effects that you should report to your doctor or health care professional as soon as possible: -allergic reactions like skin rash, itching or hives, swelling of the face, lips, or tongue -decreased sweating and/or rise in body temperature -depression -difficulty breathing, fast or irregular breathing patterns -difficulty speaking -difficulty walking or controlling muscle movements -hearing impairment -redness, blistering, peeling or loosening of the skin, including inside the mouth -tingling, pain or numbness in the hands or feet -unusual bleeding or bruising -unusually weak or tired -worsening of mood, thoughts or actions of suicide or dying Side effects that usually do not require medical attention (report to your doctor or health  care professional if they continue or are bothersome): -altered taste -back pain, joint or muscle aches and pains -diarrhea, or constipation -headache -loss of appetite -nausea -stomach upset, indigestion -tremors This list may not describe all possible side effects. Call your doctor for medical advice about side effects. You may report side effects to FDA at  1-800-FDA-1088. Where should I keep my medicine? Keep out of the reach of children. Store at room temperature between 15 and 30 degrees C (59 and 86 degrees F) in a tightly closed container. Protect from moisture. Throw away any unused medicine after the expiration date. NOTE: This sheet is a summary. It may not cover all possible information. If you have questions about this medicine, talk to your doctor, pharmacist, or health care provider.  2018 Elsevier/Gold Standard (2013-01-25 23:17:57)   Prednisone tablets What is this medicine? PREDNISONE (PRED ni sone) is a corticosteroid. It is commonly used to treat inflammation of the skin, joints, lungs, and other organs. Common conditions treated include asthma, allergies, and arthritis. It is also used for other conditions, such as blood disorders and diseases of the adrenal glands. This medicine may be used for other purposes; ask your health care provider or pharmacist if you have questions. COMMON BRAND NAME(S): Deltasone, Predone, Sterapred, Sterapred DS What should I tell my health care provider before I take this medicine? They need to know if you have any of these conditions: -Cushing's syndrome -diabetes -glaucoma -heart disease -high blood pressure -infection (especially a virus infection such as chickenpox, cold sores, or herpes) -kidney disease -liver disease -mental illness -myasthenia gravis -osteoporosis -seizures -stomach or intestine problems -thyroid disease -an unusual or allergic reaction to lactose, prednisone, other medicines, foods, dyes, or preservatives -pregnant or trying to get pregnant -breast-feeding How should I use this medicine? Take this medicine by mouth with a glass of water. Follow the directions on the prescription label. Take this medicine with food. If you are taking this medicine once a day, take it in the morning. Do not take more medicine than you are told to take. Do not suddenly stop taking  your medicine because you may develop a severe reaction. Your doctor will tell you how much medicine to take. If your doctor wants you to stop the medicine, the dose may be slowly lowered over time to avoid any side effects. Talk to your pediatrician regarding the use of this medicine in children. Special care may be needed. Overdosage: If you think you have taken too much of this medicine contact a poison control center or emergency room at once. NOTE: This medicine is only for you. Do not share this medicine with others. What if I miss a dose? If you miss a dose, take it as soon as you can. If it is almost time for your next dose, talk to your doctor or health care professional. You may need to miss a dose or take an extra dose. Do not take double or extra doses without advice. What may interact with this medicine? Do not take this medicine with any of the following medications: -metyrapone -mifepristone This medicine may also interact with the following medications: -aminoglutethimide -amphotericin B -aspirin and aspirin-like medicines -barbiturates -certain medicines for diabetes, like glipizide or glyburide -cholestyramine -cholinesterase inhibitors -cyclosporine -digoxin -diuretics -ephedrine -female hormones, like estrogens and birth control pills -isoniazid -ketoconazole -NSAIDS, medicines for pain and inflammation, like ibuprofen or naproxen -phenytoin -rifampin -toxoids -vaccines -warfarin This list may not describe all possible interactions. Give your health care provider  a list of all the medicines, herbs, non-prescription drugs, or dietary supplements you use. Also tell them if you smoke, drink alcohol, or use illegal drugs. Some items may interact with your medicine. What should I watch for while using this medicine? Visit your doctor or health care professional for regular checks on your progress. If you are taking this medicine over a prolonged period, carry an  identification card with your name and address, the type and dose of your medicine, and your doctor's name and address. This medicine may increase your risk of getting an infection. Tell your doctor or health care professional if you are around anyone with measles or chickenpox, or if you develop sores or blisters that do not heal properly. If you are going to have surgery, tell your doctor or health care professional that you have taken this medicine within the last twelve months. Ask your doctor or health care professional about your diet. You may need to lower the amount of salt you eat. This medicine may affect blood sugar levels. If you have diabetes, check with your doctor or health care professional before you change your diet or the dose of your diabetic medicine. What side effects may I notice from receiving this medicine? Side effects that you should report to your doctor or health care professional as soon as possible: -allergic reactions like skin rash, itching or hives, swelling of the face, lips, or tongue -changes in emotions or moods -changes in vision -depressed mood -eye pain -fever or chills, cough, sore throat, pain or difficulty passing urine -increased thirst -swelling of ankles, feet Side effects that usually do not require medical attention (report to your doctor or health care professional if they continue or are bothersome): -confusion, excitement, restlessness -headache -nausea, vomiting -skin problems, acne, thin and shiny skin -trouble sleeping -weight gain This list may not describe all possible side effects. Call your doctor for medical advice about side effects. You may report side effects to FDA at 1-800-FDA-1088. Where should I keep my medicine? Keep out of the reach of children. Store at room temperature between 15 and 30 degrees C (59 and 86 degrees F). Protect from light. Keep container tightly closed. Throw away any unused medicine after the expiration  date. NOTE: This sheet is a summary. It may not cover all possible information. If you have questions about this medicine, talk to your doctor, pharmacist, or health care provider.  2018 Elsevier/Gold Standard (2010-09-06 10:57:14)

## 2016-09-11 ENCOUNTER — Ambulatory Visit (INDEPENDENT_AMBULATORY_CARE_PROVIDER_SITE_OTHER): Payer: Self-pay | Admitting: Obstetrics & Gynecology

## 2016-09-11 VITALS — BP 132/87 | HR 93 | Ht 66.0 in | Wt 298.0 lb

## 2016-09-11 DIAGNOSIS — E282 Polycystic ovarian syndrome: Secondary | ICD-10-CM

## 2016-09-11 MED FILL — TOPIRAMATE 25 MG TABLET: 25 | 26 days supply | Qty: 60 | Fill #0

## 2016-09-11 MED FILL — ?PREDNISONE 20MG TABLET: 20 | 84 days supply | Qty: 84 | Fill #0

## 2016-09-12 ENCOUNTER — Encounter: Payer: Self-pay | Admitting: Obstetrics & Gynecology

## 2016-09-12 NOTE — Progress Notes (Signed)
GYNECOLOGY OFFICE VISIT NOTE  History:  31 y.o. G0P0 here today for discussion about PCOS related menorrhagia. She is trying to conceive, not on any hormones.  Also followed by Endocrinology for elevated prolactin and Neurology for lymphocytic hypophysis. She denies any abnormal vaginal discharge, pelvic pain or other concerns.   Past Medical History:  Diagnosis Date  . Anxiety 2013   [facility] by Deatra RobinsonKaren Jones   . Asthma 1989    no hospitalization, or intubation, previously on advair and singulair. asthma worsening.   . 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 Depression 2001   depressed since childhood   . 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 Kyphosis of thoracic region 2004    painful, runs in dad side   . Migraine   . Pancreatitis 2003   gallstone pancreatitis   . PCOS (polycystic ovarian syndrome) 2014   facial hair, irregular periods, never been pregnant     Past Surgical History:  Procedure Laterality Date  . CHOLECYSTECTOMY  2003   . OPEN REDUCTION INTERNAL FIXATION (ORIF) PROXIMAL PHALANX Right 07/23/2013   Procedure: OPEN TREATMENT RIGHT RING FINGER, PROXIMAL INTERPHALANGEAL/JOINT FRACTURE DISLOCATION;  Surgeon: Jodi Marbleavid A Thompson, MD;  Location: [facility];  Service: Orthopedics;  Laterality: Right;  . TONSILLECTOMY      The following portions of the patient's history were reviewed and updated as appropriate: allergies, current medications, past family history, past medical history, past social history, past surgical history and problem list.   Health Maintenance:  Normal pap and negative HRHPV on 03/25/2016.    Review of Systems:  Pertinent items noted in HPI and remainder of comprehensive ROS otherwise negative.   Objective:  Physical Exam BP 132/87   Pulse 93   Ht 5\' 6"  (1.676 m)   Wt 298 lb (135.2 kg)   LMP 08/18/2016 (Within Days)   BMI 48.10 kg/m  CONSTITUTIONAL: Well-developed, well-nourished female in no acute  distress.  HENT:  Normocephalic, atraumatic. External right and left ear normal. Oropharynx is clear and moist EYES: Conjunctivae and EOM are normal. Pupils are equal, round, and reactive to light. No scleral icterus.  NECK: Normal range of motion, supple, no masses SKIN: Skin is warm and dry. No rash noted. Not diaphoretic. No erythema. No pallor. NEUROLOGIC: Alert and oriented to person, place, and time. Normal reflexes, muscle tone coordination. No cranial nerve deficit noted. PSYCHIATRIC: Normal mood and affect. Normal behavior. Normal judgment and thought content. CARDIOVASCULAR: Normal heart rate noted RESPIRATORY: Effort and breath sounds normal, no problems with respiration noted ABDOMEN: Soft, no distention noted.   PELVIC: Deferred MUSCULOSKELETAL: Normal range of motion. No edema noted.   Assessment & Plan:  1. PCOS (polycystic ovarian syndrome) Counseled patient about management of PCOS, recommended weight loss which helps with restoring ovulatory cycles, decreases glucose intolerance with improvement of metabolic risk, improves fertility/pregnancy rates and helps with overall health.  Even modest weight loss (5 to 10 percent reduction in body weight) in women with PCOS may result in these effects.   PCOS is also treated with Metformin given its association with glucose intolerance and insulin resistance.  She is currently on Metformin 1000 mg, advised to increase to 1500-2000 mg daily.  Over 50% of PCOS patients on 1500mg  of Metformin daily have been shown to ovulate successfully. Common side effects include GI intolerance, kidney and liver enzyme irregularities, lactic acidosis.   Patient will return in 1 month  for followup. Bleeding precautions reviewed.  For menorrhagia, patient declines hormonal intervention or other therapy for now as she is focused on getting pregnant.  Ovulation predictor kits recommended,  which can help in knowing when to have frequent intercourse at least  every other day around the time of ovulation.   At next visit, we will discuss possibility of adding Clomid to her regimen. Other steps will involve doing a semen analysis then HSG as subsequent evaluation.  Patient was given the option to be referred to a Reproductive Endocrinology and Infertility Specialist now or at any time, she will investigate this options and costs and decide whether to proceed.  Routine preventative health maintenance measures emphasized. Please refer to After Visit Summary for other counseling recommendations.   Return in 1 month (on 10/12/2016) for Followup.   Total face-to-face time with patient: 30 minutes. Over 50% of encounter was spent on counseling and coordination of care.   Jaynie Collins, MD, FACOG Attending Obstetrician & Gynecologist, [facility] [employer], [facility]

## 2016-09-12 NOTE — Progress Notes (Signed)
Late entry documentation for 8/8 @ 220 visit due to epic downtime

## 2016-09-18 ENCOUNTER — Encounter: Payer: Self-pay | Admitting: Endocrinology

## 2016-09-18 ENCOUNTER — Ambulatory Visit (INDEPENDENT_AMBULATORY_CARE_PROVIDER_SITE_OTHER): Payer: Self-pay | Admitting: Endocrinology

## 2016-09-18 VITALS — BP 122/82 | HR 93 | Wt 301.2 lb

## 2016-09-18 DIAGNOSIS — E230 Hypopituitarism: Secondary | ICD-10-CM

## 2016-09-18 LAB — URINALYSIS, ROUTINE W REFLEX MICROSCOPIC
Hgb urine dipstick: NEGATIVE
Leukocytes, UA: NEGATIVE
Nitrite: NEGATIVE
PH: 5.5 (ref 5.0–8.0)
RBC / HPF: NONE SEEN (ref 0–?)
Specific Gravity, Urine: >=1.03 — AB (ref 1.000–1.030)
Total Protein, Urine: NEGATIVE
URINE GLUCOSE: NEGATIVE
Urobilinogen, UA: 0.2 (ref 0.0–1.0)

## 2016-09-18 LAB — BASIC METABOLIC PANEL
BUN: 8 mg/dL (ref 6–23)
CHLORIDE: 105 meq/L (ref 96–112)
CO2: 29 mEq/L (ref 19–32)
CREATININE: 0.91 mg/dL (ref 0.40–1.20)
Calcium: 9.1 mg/dL (ref 8.4–10.5)
GFR: 77.35 mL/min (ref >60.00)
Glucose, Bld: 100 mg/dL — ABNORMAL HIGH (ref 70–99)
Potassium: 3.7 mEq/L (ref 3.5–5.1)
Sodium: 139 mEq/L (ref 135–145)

## 2016-09-18 LAB — CORTISOL
Cortisol, Plasma: 9.2 ug/dL
Cortisol, Plasma: 19.8 ug/dL

## 2016-09-18 LAB — T4, FREE: FREE T4: 1.07 ng/dL (ref 0.60–1.60)

## 2016-09-18 LAB — TSH: TSH: 1.37 u[IU]/mL (ref 0.35–4.50)

## 2016-09-18 MED ORDER — COSYNTROPIN NICU IV SYRINGE 0.25 MG/ML (STANDARD DOSE)
0.2500 mg | Freq: Once | INTRAVENOUS | Status: AC
  Administered 2016-09-18: 0.25 mg via INTRAMUSCULAR

## 2016-09-18 NOTE — Progress Notes (Signed)
Subjective:    Patient ID: Kristen [name], female    DOB: 1985/03/03, 31 y.o.   MRN: [id number]  HPI Pt states few mos of slight galactorrhea from both breasts, and assoc heavy menses.  Past Medical History:  Diagnosis Date  . Anxiety 2013   treated at [facility] by Pauline Good   . Asthma 1989    no hospitalization, or intubation, previously on advair and singulair. asthma worsening.   . 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 Depression 2001   depressed since childhood   . 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 Kyphosis of thoracic region 2004    painful, runs in dad side   . Migraine   . Pancreatitis 2003   gallstone pancreatitis   . PCOS (polycystic ovarian syndrome) 2014   facial hair, irregular periods, never been pregnant     Past Surgical History:  Procedure Laterality Date  . CHOLECYSTECTOMY  2003   . OPEN REDUCTION INTERNAL FIXATION (ORIF) PROXIMAL PHALANX Right 07/23/2013   Procedure: OPEN TREATMENT RIGHT RING FINGER, PROXIMAL INTERPHALANGEAL/JOINT FRACTURE DISLOCATION;  Surgeon: Jolyn Nap, MD;  Location: [facility];  Service: Orthopedics;  Laterality: Right;  . TONSILLECTOMY      Social History   Social History  . Marital status: Single    Spouse name: N/A  . Number of children: 0  . Years of education: college    Occupational History  . Unemployed     Social History Main Topics  . Smoking status: Never Smoker  . Smokeless tobacco: Never Used  . Alcohol use No  . Drug use: No  . Sexual activity: Yes    Birth control/ protection: None   Other Topics Concern  . Not on file   Social History Narrative   Lives with mom [name])    Right-handed   Caffeine: 3 cups of coffee per day    Current Outpatient Prescriptions on File Prior to Visit  Medication Sig Dispense Refill  . albuterol (PROVENTIL HFA;VENTOLIN HFA) 108 (90 BASE) MCG/ACT inhaler Inhale 2 puffs into the lungs every 6 (six) hours as needed for wheezing or  shortness of breath. (Patient not taking: Reported on 09/12/2016) 6.7 g 3  . hydrOXYzine (VISTARIL) 50 MG capsule Take 1 capsule (50 mg total) by mouth 3 (three) times daily as needed. 60 capsule 0  . LORazepam (ATIVAN) 0.5 MG tablet Take 1 tablet (0.5 mg total) by mouth every 8 (eight) hours as needed for anxiety. (Patient not taking: Reported on 09/12/2016) 20 tablet 0  . metFORMIN (GLUCOPHAGE-XR) 500 MG 24 hr tablet Take 1-2 tablets (500-1,000 mg total) by mouth daily after supper. Start with 500 mg for one week, then increase to 1000 mg 60 tablet 2  . Multiple Vitamin (MULTIVITAMIN) tablet Take 1 tablet by mouth daily.    . naproxen (NAPROSYN) 500 MG tablet TAKE 1 TABLET BY MOUTH 2 TIMES DAILY WITH A MEAL. 30 tablet 0  . sertraline (ZOLOFT) 100 MG tablet Take 100 mg by mouth daily.     No current facility-administered medications on file prior to visit.     Allergies  Allergen Reactions  . Mucinex [Guaifenesin Er] Hives, Itching and Swelling  . Penicillins Swelling  . Contrast Media [Iodinated Diagnostic Agents] Nausea And Vomiting  . Multihance [Gadobenate] Nausea And Vomiting    Family History  Problem Relation Age of Onset  . Hypertension Mother   . Arnold-Chiari malformation Mother   .  Restless legs syndrome Mother   . Osteoporosis Mother   . COPD Mother   . Asthma Mother   . Squamous cell carcinoma Mother        skin   . Eczema Mother   . Arthritis Mother   . Peripheral Artery Disease Mother   . Hypertension Father   . Scoliosis Father   . Bipolar disorder Father   . Congestive Heart Failure Father   . Diabetes Maternal Grandmother   . Hypertension Maternal Grandmother   . Cancer Paternal Grandmother        colon  . Bone cancer Maternal Grandfather 82       bone marrow   . Multiple myeloma Maternal Grandfather   . Diabetes Paternal Grandfather     BP 122/82   Pulse 93   Wt (!) 301 lb 3.2 oz (136.6 kg)   SpO2 98%   BMI 48.61 kg/m    Review of Systems denies  polyuria, loss of sense of smell, syncope, rash, headache, recent weight change, easy bruising, rhinorrhea, and n/v. She has fatigue, headache, blurry vision, easy bruising, and depression.      Objective:   Physical Exam VS: see vs page GEN: no distress HEAD: head: no deformity eyes: no periorbital swelling, no proptosis external nose and ears are normal mouth: no lesion seen NECK: supple, thyroid is not enlarged CHEST WALL: no deformity LUNGS: clear to auscultation CV: reg rate and rhythm, no murmur ABD: abdomen is soft, nontender.  no hepatosplenomegaly.  not distended.  no hernia MUSCULOSKELETAL: muscle bulk and strength are grossly normal.  no obvious joint swelling.  gait is normal and steady EXTEMITIES: No leg edema PULSES: no carotid bruit NEURO:  cn 2-12 grossly intact.   readily moves all 4's.  sensation is intact to touch on all 4's.  SKIN:  Normal texture and temperature.  No rash or suspicious lesion is visible.  Moderate terminal hair on the face and abdomen.  NODES:  None palpable at the neck PSYCH: alert, well-oriented.  Does not appear anxious nor depressed.    MRI: No focal pituitary lesion. 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   Prolactin=33  I have reviewed outside records, and summarized: Pt was noted to have elevated prolactin, and referred here.  She is trying to conceive, and clomid is also being considered.      Assessment & Plan:  Pituitary microadenoma, new. Hyperprolactinemia: more likely assoc with PCO than the lymphocytic hypophysitis.   Patient Instructions  blood tests are requested for you today.  We'll let you know about the results.  Based on the results, I'll probably prescribe a medication for the prolactin or thyroid (the other tests will probably be normal). Please come back for a follow-up appointment in 2 months.  When you get insurance, you should consider the weight loss surgery.

## 2016-09-18 NOTE — Patient Instructions (Addendum)
blood tests are requested for you today.  We'll let you know about the results.  Based on the results, I'll probably prescribe a medication for the prolactin or thyroid (the other tests will probably be normal). Please come back for a follow-up appointment in 2 months.  When you get insurance, you should consider the weight loss surgery.

## 2016-09-19 LAB — PROLACTIN: PROLACTIN: 15.9 ng/mL

## 2016-09-20 ENCOUNTER — Ambulatory Visit: Payer: Self-pay | Attending: Family Medicine

## 2016-09-20 LAB — ACTH: C206 ACTH: 37 pg/mL (ref 6–50)

## 2016-09-20 MED ORDER — BROMOCRIPTINE MESYLATE 2.5 MG PO TABS: 1.2500 mg | ORAL_TABLET | Freq: Every day | ORAL | 11 refills | Status: DC

## 2016-10-03 LAB — ARGININE VASOPRESSIN HORMONE: Arginine Vasopressin: <1 pg/mL — ABNORMAL LOW

## 2016-10-09 ENCOUNTER — Institutional Professional Consult (permissible substitution): Payer: Self-pay | Admitting: Neurology

## 2016-10-09 ENCOUNTER — Telehealth: Payer: Self-pay

## 2016-10-09 NOTE — Telephone Encounter (Signed)
Pt did not show for a sleep consult with Dr. Frances FurbishAthar today.

## 2016-10-10 ENCOUNTER — Encounter: Payer: Self-pay | Admitting: Internal Medicine

## 2016-10-10 ENCOUNTER — Ambulatory Visit: Payer: Self-pay | Attending: Internal Medicine | Admitting: Internal Medicine

## 2016-10-10 VITALS — BP 135/87 | HR 101 | Temp 98.2°F | Resp 18 | Ht 66.0 in | Wt 299.4 lb

## 2016-10-10 DIAGNOSIS — Z833 Family history of diabetes mellitus: Secondary | ICD-10-CM | POA: Insufficient documentation

## 2016-10-10 DIAGNOSIS — J45909 Unspecified asthma, uncomplicated: Secondary | ICD-10-CM | POA: Insufficient documentation

## 2016-10-10 DIAGNOSIS — Z9049 Acquired absence of other specified parts of digestive tract: Secondary | ICD-10-CM | POA: Insufficient documentation

## 2016-10-10 DIAGNOSIS — Z6841 Body Mass Index (BMI) 40.0 and over, adult: Secondary | ICD-10-CM | POA: Insufficient documentation

## 2016-10-10 DIAGNOSIS — L304 Erythema intertrigo: Secondary | ICD-10-CM | POA: Insufficient documentation

## 2016-10-10 DIAGNOSIS — Z8261 Family history of arthritis: Secondary | ICD-10-CM | POA: Insufficient documentation

## 2016-10-10 DIAGNOSIS — Z800 Family history of malignant neoplasm of digestive organs: Secondary | ICD-10-CM | POA: Insufficient documentation

## 2016-10-10 DIAGNOSIS — Z7984 Long term (current) use of oral hypoglycemic drugs: Secondary | ICD-10-CM | POA: Insufficient documentation

## 2016-10-10 DIAGNOSIS — Z880 Allergy status to penicillin: Secondary | ICD-10-CM | POA: Insufficient documentation

## 2016-10-10 DIAGNOSIS — N920 Excessive and frequent menstruation with regular cycle: Secondary | ICD-10-CM | POA: Insufficient documentation

## 2016-10-10 DIAGNOSIS — R51 Headache: Secondary | ICD-10-CM | POA: Insufficient documentation

## 2016-10-10 DIAGNOSIS — Z9889 Other specified postprocedural states: Secondary | ICD-10-CM | POA: Insufficient documentation

## 2016-10-10 DIAGNOSIS — K0381 Cracked tooth: Secondary | ICD-10-CM | POA: Insufficient documentation

## 2016-10-10 DIAGNOSIS — F4010 Social phobia, unspecified: Secondary | ICD-10-CM | POA: Insufficient documentation

## 2016-10-10 DIAGNOSIS — M419 Scoliosis, unspecified: Secondary | ICD-10-CM | POA: Insufficient documentation

## 2016-10-10 DIAGNOSIS — G2581 Restless legs syndrome: Secondary | ICD-10-CM | POA: Insufficient documentation

## 2016-10-10 DIAGNOSIS — Z888 Allergy status to other drugs, medicaments and biological substances status: Secondary | ICD-10-CM | POA: Insufficient documentation

## 2016-10-10 DIAGNOSIS — R480 Dyslexia and alexia: Secondary | ICD-10-CM | POA: Insufficient documentation

## 2016-10-10 DIAGNOSIS — N921 Excessive and frequent menstruation with irregular cycle: Secondary | ICD-10-CM

## 2016-10-10 DIAGNOSIS — E230 Hypopituitarism: Secondary | ICD-10-CM

## 2016-10-10 DIAGNOSIS — E282 Polycystic ovarian syndrome: Secondary | ICD-10-CM | POA: Insufficient documentation

## 2016-10-10 DIAGNOSIS — Z808 Family history of malignant neoplasm of other organs or systems: Secondary | ICD-10-CM | POA: Insufficient documentation

## 2016-10-10 DIAGNOSIS — Z23 Encounter for immunization: Secondary | ICD-10-CM | POA: Insufficient documentation

## 2016-10-10 DIAGNOSIS — Z79899 Other long term (current) drug therapy: Secondary | ICD-10-CM | POA: Insufficient documentation

## 2016-10-10 DIAGNOSIS — Z91041 Radiographic dye allergy status: Secondary | ICD-10-CM | POA: Insufficient documentation

## 2016-10-10 DIAGNOSIS — Z818 Family history of other mental and behavioral disorders: Secondary | ICD-10-CM | POA: Insufficient documentation

## 2016-10-10 DIAGNOSIS — Z825 Family history of asthma and other chronic lower respiratory diseases: Secondary | ICD-10-CM | POA: Insufficient documentation

## 2016-10-10 DIAGNOSIS — Z8249 Family history of ischemic heart disease and other diseases of the circulatory system: Secondary | ICD-10-CM | POA: Insufficient documentation

## 2016-10-10 NOTE — Progress Notes (Signed)
Patient ID: YARIS FERRELL, female    DOB: 09-09-1985  MRN: [id number]  CC: No chief complaint on file.   Subjective: Kristen [name] is a 31 y.o. female who presents for chronic ds management. Mother, [name], is with her. Last saw Dr. Adrian Blackwater 07/2016. Her concerns today include:  Pt with hx of PCOS, morbid obesity, tob. Dep, RLS, social phobia and recently dx lymphocytic hypophysitis  1. PCOS and menorrhagia Saw gynecology since last visit. She agreed with use of metformin to try to bring about regular ovulation and decrease insulin resistance. Patient declined any hormonal birth control as she is wanting to get pregnant. Has follow-up with GYN in several weeks. May consider Clomid -taking Metformin1 gram daily. Tolerating it without GI upset -some fatigue and head rush when she bends over and gets up to quickly.  2. Lymphocytic Hypophysitis:  On recent MRI that was ordered because of elevated prolactin and headaches ddx include sarcoid, syphilis, TB, vasculitides -Saw a neurologist and endocrinologist -Started on prednisone taper by neurologist and had LP done which was negative.  referred for sleep study which is scheduled for next month. Plan is to repeat MRI on follow-up visit Labs were ordered but patient did not have them done. Will have them done today through our lab -Started on bromocriptine by endocrinologist but patient was not aware of the prescription and is not taking. Also told by endocrine to d/c Prednisone.  3. Taking Zoloft and Hydroxyzine for anxiety.  Not taking Lorazepam  Trying to get RMA. Finished and test 10/18/2016. Has 8 doctors appt for later this mth including several appts, sleep study 11/08/2016  Requesting letter for Soc. Services office to allow her to get more food stamps for the next 6 wks. Unable to work given the # of doctor's appts she has scheduled.   4. Obesity: Walking 3-4 x a wk She has cut portion sizes and dec sodas.   Patient Active  Problem List   Diagnosis Date Noted  . 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 Elevated prolactin level ([place]) 07/05/2016  . Vulvar lesion 07/05/2016  . Restless legs 05/24/2016  . Pain, dental 04/09/2016  . Seasonal allergies 05/13/2014  . Intertrigo 05/13/2014  . Decreased vision 05/13/2014  . Broken or cracked tooth, nontraumatic 05/10/2014  . PCOS (polycystic ovarian syndrome) 02/07/2014  . Social phobia 02/02/2007  . ADD 02/02/2007  . 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 Asthma 02/02/2007  . SCOLIOSIS 02/02/2007  . OTHER SPECIFIED CONGENITAL ANOMALIES OF KIDNEY 02/02/2007  . ELECTROCARDIOGRAM, ABNORMAL 02/02/2007     Current Outpatient Prescriptions on File Prior to Visit  Medication Sig Dispense Refill  . metFORMIN (GLUCOPHAGE-XR) 500 MG 24 hr tablet Take 1-2 tablets (500-1,000 mg total) by mouth daily after supper. Start with 500 mg for one week, then increase to 1000 mg 60 tablet 2  . albuterol (PROVENTIL HFA;VENTOLIN HFA) 108 (90 BASE) MCG/ACT inhaler Inhale 2 puffs into the lungs every 6 (six) hours as needed for wheezing or shortness of breath. (Patient not taking: Reported on 09/12/2016) 6.7 g 3  . bromocriptine (PARLODEL) 2.5 MG tablet Take 0.5 tablets (1.25 mg total) by mouth at bedtime. 15 tablet 11  . hydrOXYzine (VISTARIL) 50 MG capsule Take 1 capsule (50 mg total) by mouth 3 (three) times daily as needed. 60 capsule 0  . Multiple Vitamin (MULTIVITAMIN) tablet Take 1 tablet by mouth daily.    . naproxen (NAPROSYN) 500 MG tablet TAKE 1 TABLET BY MOUTH 2 TIMES DAILY WITH A MEAL.  30 tablet 0  . sertraline (ZOLOFT) 100 MG tablet Take 100 mg by mouth daily.     No current facility-administered medications on file prior to visit.     Allergies  Allergen Reactions  . Mucinex [Guaifenesin Er] Hives, Itching and Swelling  . Penicillins Swelling  . Contrast Media [Iodinated Diagnostic Agents] Nausea And Vomiting  . Multihance [Gadobenate] Nausea And Vomiting    Social History    Social History  . Marital status: Single    Spouse name: N/A  . Number of children: 0  . Years of education: college    Occupational History  . Unemployed     Social History Main Topics  . Smoking status: Never Smoker  . Smokeless tobacco: Never Used  . Alcohol use No  . Drug use: No  . Sexual activity: Yes    Birth control/ protection: None   Other Topics Concern  . Not on file   Social History Narrative   Lives with mom [name])    Right-handed   Caffeine: 3 cups of coffee per day    Family History  Problem Relation Age of Onset  . Hypertension Mother   . Arnold-Chiari malformation Mother   . Restless legs syndrome Mother   . Osteoporosis Mother   . COPD Mother   . Asthma Mother   . Squamous cell carcinoma Mother        skin   . Eczema Mother   . Arthritis Mother   . Peripheral Artery Disease Mother   . Hypertension Father   . Scoliosis Father   . Bipolar disorder Father   . Congestive Heart Failure Father   . Diabetes Maternal Grandmother   . Hypertension Maternal Grandmother   . Cancer Paternal Grandmother        colon  . Bone cancer Maternal Grandfather 82       bone marrow   . Multiple myeloma Maternal Grandfather   . Diabetes Paternal Grandfather     Past Surgical History:  Procedure Laterality Date  . CHOLECYSTECTOMY  2003   . OPEN REDUCTION INTERNAL FIXATION (ORIF) PROXIMAL PHALANX Right 07/23/2013   Procedure: OPEN TREATMENT RIGHT RING FINGER, PROXIMAL INTERPHALANGEAL/JOINT FRACTURE DISLOCATION;  Surgeon: Jolyn Nap, MD;  Location: [facility];  Service: Orthopedics;  Laterality: Right;  . TONSILLECTOMY      ROS: Review of Systems  PHYSICAL EXAM: BP 135/87 (BP Location: Left Arm, Patient Position: Sitting, Cuff Size: Large)   Pulse (!) 101   Temp 98.2 F (36.8 C) (Oral)   Resp 18   Ht 5' 6"  (1.676 m)   Wt 299 lb 6.4 oz (135.8 kg)   LMP  (Approximate) Comment: About 2 weeks ago.  SpO2 95%   BMI 48.32 kg/m   Wt  Readings from Last 3 Encounters:  10/10/16 299 lb 6.4 oz (135.8 kg)  09/18/16 (!) 301 lb 3.2 oz (136.6 kg)  09/12/16 298 lb (135.2 kg)    Physical Exam General appearance - alert, well appearing, morbidly obese young Caucasian female and in no distress Mental status - alert, oriented to person, place, and time, normal mood, behavior, speech, dress, motor activity, and thought processes Chest - clear to auscultation, no wheezes, rales or rhonchi, symmetric air entry Heart - normal rate, regular rhythm, normal S1, S2, no murmurs, rubs, clicks or gallops Extremities - peripheral pulses normal, no pedal edema, no clubbing or cyanosis   Depression screen [facility] 2/9 10/10/2016 07/05/2016 05/24/2016 04/09/2016 05/13/2014  Decreased Interest 2 1  1 3 0  Down, Depressed, Hopeless 3 2 2 3  0  PHQ - 2 Score 5 3 3 6  0  Altered sleeping 1 3 2 1  -  Tired, decreased energy 3 3 2 3  -  Change in appetite 0 0 0 2 -  Feeling bad or failure about yourself  3 2 3 3  -  Trouble concentrating 0 0 1 2 -  Moving slowly or fidgety/restless 0 0 1 3 -  Suicidal thoughts 0 0 - 3 -  PHQ-9 Score 12 11 12 23  -   GAD 7 : Generalized Anxiety Score 07/05/2016 05/24/2016 04/09/2016  Nervous, Anxious, on Edge 3 2 3   Control/stop worrying 3 2 1   Worry too much - different things 3 3 1   Trouble relaxing 0 2 1  Restless 3 3 1   Easily annoyed or irritable 2 1 3   Afraid - awful might happen 3 0 2  Total GAD 7 Score 17 13 12      ASSESSMENT AND PLAN: 1. PCOS (polycystic ovarian syndrome) -Continue metformin Keep follow up appointment with GYN  2. 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 Keep follow-up appointment with neurology. Looks like she was also referred to a neurosurgeon and was waiting for that appointment -keep appt for sleep study next mth -Advised patient to call endocrinologist office and inquire whether she should  be taking the bromocriptine or not - RPR - Angiotensin converting enzyme - Pan-ANCA  3. Menorrhagia with irregular cycle Followed by GYN Patient actively wanting to get pregnant. May consider holding off until the lymphocytic Hypophysitis is fully worked up  4. Social phobia Continue Zoloft and hydroxyzine  5. Influenza vaccine needed - Flu Vaccine QUAD 36+ mos IM  6. Class 3 severe obesity due to excess calories without serious comorbidity with body mass index (BMI) of 45.0 to 49.9 in adult [facility]) Continue to encourage her and healthy eating habits and regular exercise  Letter given for social services.  Patient was given the opportunity to ask questions.  Patient verbalized understanding of the plan and was able to repeat key elements of the plan.   Orders Placed This Encounter  Procedures  . Flu Vaccine QUAD 36+ mos IM  . RPR  . Angiotensin converting enzyme  . Pan-ANCA     Requested Prescriptions    No prescriptions requested or ordered in this encounter    Return in about 3 months (around 01/09/2017).  Karle Plumber, MD, FACP

## 2016-10-10 NOTE — Patient Instructions (Addendum)
Call the endocrinologist that you saw last month and inquire whether he wants you to still take Bromocriptine given that the Prolactin level has normalized.   Influenza Virus Vaccine injection (Fluarix) What is this medicine? INFLUENZA VIRUS VACCINE (in floo EN zuh VAHY ruhs vak SEEN) helps to reduce the risk of getting influenza also known as the flu. This medicine may be used for other purposes; ask your health care provider or pharmacist if you have questions. COMMON BRAND NAME(S): Fluarix, Fluzone What should I tell my health care provider before I take this medicine? They need to know if you have any of these conditions: -bleeding disorder like hemophilia -fever or infection -Guillain-Barre syndrome or other neurological problems -immune system problems -infection with the human immunodeficiency virus (HIV) or AIDS -low blood platelet counts -multiple sclerosis -an unusual or allergic reaction to influenza virus vaccine, eggs, chicken proteins, latex, gentamicin, other medicines, foods, dyes or preservatives -pregnant or trying to get pregnant -breast-feeding How should I use this medicine? This vaccine is for injection into a muscle. It is given by a health care professional. A copy of Vaccine Information Statements will be given before each vaccination. Read this sheet carefully each time. The sheet may change frequently. Talk to your pediatrician regarding the use of this medicine in children. Special care may be needed. Overdosage: If you think you have taken too much of this medicine contact a poison control center or emergency room at once. NOTE: This medicine is only for you. Do not share this medicine with others. What if I miss a dose? This does not apply. What may interact with this medicine? -chemotherapy or radiation therapy -medicines that lower your immune system like etanercept, anakinra, infliximab, and adalimumab -medicines that treat or prevent blood clots like  warfarin -phenytoin -steroid medicines like prednisone or cortisone -theophylline -vaccines This list may not describe all possible interactions. Give your health care provider a list of all the medicines, herbs, non-prescription drugs, or dietary supplements you use. Also tell them if you smoke, drink alcohol, or use illegal drugs. Some items may interact with your medicine. What should I watch for while using this medicine? Report any side effects that do not go away within 3 days to your doctor or health care professional. Call your health care provider if any unusual symptoms occur within 6 weeks of receiving this vaccine. You may still catch the flu, but the illness is not usually as bad. You cannot get the flu from the vaccine. The vaccine will not protect against colds or other illnesses that may cause fever. The vaccine is needed every year. What side effects may I notice from receiving this medicine? Side effects that you should report to your doctor or health care professional as soon as possible: -allergic reactions like skin rash, itching or hives, swelling of the face, lips, or tongue Side effects that usually do not require medical attention (report to your doctor or health care professional if they continue or are bothersome): -fever -headache -muscle aches and pains -pain, tenderness, redness, or swelling at site where injected -weak or tired This list may not describe all possible side effects. Call your doctor for medical advice about side effects. You may report side effects to FDA at 1-800-FDA-1088. Where should I keep my medicine? This vaccine is only given in a clinic, pharmacy, doctor's office, or other health care setting and will not be stored at home. NOTE: This sheet is a summary. It may not cover all possible information.  If you have questions about this medicine, talk to your doctor, pharmacist, or health care provider.  2018 Elsevier/Gold Standard (2007-08-19  09:30:40)

## 2016-10-11 ENCOUNTER — Other Ambulatory Visit: Payer: Self-pay

## 2016-10-11 ENCOUNTER — Telehealth: Payer: Self-pay | Admitting: Endocrinology

## 2016-10-11 MED ORDER — BROMOCRIPTINE MESYLATE 2.5 MG PO TABS: 1.2500 mg | ORAL_TABLET | Freq: Every day | ORAL | 11 refills | Status: DC

## 2016-10-11 NOTE — Telephone Encounter (Signed)
MEDICATION: bromocriptine (PARLODEL) 2.5 MG tablet refill  PHARMACY: [facility] [place] [place] [address] Phone: [phone] Fax: [phone]    This is a new script that was sent to wrong pharmacy.  Ty,  -LL

## 2016-10-11 NOTE — Telephone Encounter (Signed)
Routing to you °

## 2016-10-12 LAB — PAN-ANCA
ANCA Proteinase 3: <3.5 U/mL (ref 0.0–3.5)
P-ANCA: <1:20 {titer}

## 2016-10-12 LAB — ANGIOTENSIN CONVERTING ENZYME: ANGIO CONVERT ENZYME: 60 U/L (ref 14–82)

## 2016-10-12 LAB — RPR: RPR Ser Ql: NONREACTIVE

## 2016-10-14 ENCOUNTER — Telehealth: Payer: Self-pay

## 2016-10-14 NOTE — Telephone Encounter (Signed)
Contacted pt to go over lab results pt is [facility] and mother is [facility]. Gave pt's mother lab results. Pt's mother doesn't have any questions or concerns

## 2016-10-17 ENCOUNTER — Ambulatory Visit (INDEPENDENT_AMBULATORY_CARE_PROVIDER_SITE_OTHER): Payer: Self-pay | Admitting: Neurology

## 2016-10-17 ENCOUNTER — Encounter: Payer: Self-pay | Admitting: Neurology

## 2016-10-17 VITALS — BP 114/81 | HR 91 | Ht 66.0 in | Wt 299.8 lb

## 2016-10-17 DIAGNOSIS — G43009 Migraine without aura, not intractable, without status migrainosus: Secondary | ICD-10-CM

## 2016-10-17 DIAGNOSIS — H05119 Granuloma of unspecified orbit: Secondary | ICD-10-CM

## 2016-10-17 DIAGNOSIS — R51 Headache with orthostatic component, not elsewhere classified: Secondary | ICD-10-CM

## 2016-10-17 DIAGNOSIS — R519 Headache, unspecified: Secondary | ICD-10-CM

## 2016-10-17 DIAGNOSIS — E236 Other disorders of pituitary gland: Secondary | ICD-10-CM

## 2016-10-17 DIAGNOSIS — H547 Unspecified visual loss: Secondary | ICD-10-CM

## 2016-10-17 MED ORDER — TOPIRAMATE 25 MG PO TABS: ORAL_TABLET | ORAL | 3 refills | Status: DC

## 2016-10-17 MED FILL — TOPIRAMATE 25 MG TABLET: 25 | 26 days supply | Qty: 60 | Fill #1

## 2016-10-17 NOTE — Patient Instructions (Addendum)
Remember to drink plenty of fluid, eat healthy meals and do not skip any meals. Try to eat protein with a every meal and eat a healthy snack such as fruit or nuts in between meals. Try to keep a regular sleep-wake schedule and try to exercise daily, particularly in the form of walking, 20-30 minutes a day, if you can.   As far as your medications are concerned, I would like to suggest:  Start Topiramate  As far as diagnostic testing: MRA of the brain and orbits  I would like to see you back in 3 months, sooner if we need to. Please call us with any interim questions, concerns, problems, updates or refill requests.    Our phone number is [phone]. We also have an after hours call service for urgent matters and there is a physician on-call for urgent questions. For any emergencies you know to call 911 or go to the nearest emergency room  Topiramate tablets What is this medicine? TOPIRAMATE (toe PYRE a mate) is used to treat seizures in adults or children with epilepsy. It is also used for the prevention of migraine headaches. This medicine may be used for other purposes; ask your health care provider or pharmacist if you have questions. COMMON BRAND NAME(S): Topamax, Topiragen What should I tell my health care provider before I take this medicine? They need to know if you have any of these conditions: -bleeding disorders -cirrhosis of the liver or liver disease -diarrhea -glaucoma -kidney stones or kidney disease -low blood counts, like low white cell, platelet, or red cell counts -lung disease like asthma, obstructive pulmonary disease, emphysema -metabolic acidosis -on a ketogenic diet -schedule for surgery or a procedure -suicidal thoughts, plans, or attempt; a previous suicide attempt by you or a family member -an unusual or allergic reaction to topiramate, other medicines, foods, dyes, or preservatives -pregnant or trying to get pregnant -breast-feeding How should I use this  medicine? Take this medicine by mouth with a glass of water. Follow the directions on the prescription label. Do not crush or chew. You may take this medicine with meals. Take your medicine at regular intervals. Do not take it more often than directed. Talk to your pediatrician regarding the use of this medicine in children. Special care may be needed. While this drug may be prescribed for children as young as 63 years of age for selected conditions, precautions do apply. Overdosage: If you think you have taken too much of this medicine contact a poison control center or emergency room at once. NOTE: This medicine is only for you. Do not share this medicine with others. What if I miss a dose? If you miss a dose, take it as soon as you can. If your next dose is to be taken in less than 6 hours, then do not take the missed dose. Take the next dose at your regular time. Do not take double or extra doses. What may interact with this medicine? Do not take this medicine with any of the following medications: -probenecid This medicine may also interact with the following medications: -acetazolamide -alcohol -amitriptyline -aspirin and aspirin-like medicines -birth control pills -certain medicines for depression -certain medicines for seizures -certain medicines that treat or prevent blood clots like warfarin, enoxaparin, dalteparin, apixaban, dabigatran, and rivaroxaban -digoxin -hydrochlorothiazide -lithium -medicines for pain, sleep, or muscle relaxation -metformin -methazolamide -NSAIDS, medicines for pain and inflammation, like ibuprofen or naproxen -pioglitazone -risperidone This list may not describe all possible interactions. Give your health care  provider a list of all the medicines, herbs, non-prescription drugs, or dietary supplements you use. Also tell them if you smoke, drink alcohol, or use illegal drugs. Some items may interact with your medicine. What should I watch for while  using this medicine? Visit your doctor or health care professional for regular checks on your progress. Do not stop taking this medicine suddenly. This increases the risk of seizures if you are using this medicine to control epilepsy. Wear a medical identification bracelet or chain to say you have epilepsy or seizures, and carry a card that lists all your medicines. This medicine can decrease sweating and increase your body temperature. Watch for signs of deceased sweating or fever, especially in children. Avoid extreme heat, hot baths, and saunas. Be careful about exercising, especially in hot weather. Contact your health care provider right away if you notice a fever or decrease in sweating. You should drink plenty of fluids while taking this medicine. If you have had kidney stones in the past, this will help to reduce your chances of forming kidney stones. If you have stomach pain, with nausea or vomiting and yellowing of your eyes or skin, call your doctor immediately. You may get drowsy, dizzy, or have blurred vision. Do not drive, use machinery, or do anything that needs mental alertness until you know how this medicine affects you. To reduce dizziness, do not sit or stand up quickly, especially if you are an older patient. Alcohol can increase drowsiness and dizziness. Avoid alcoholic drinks. If you notice blurred vision, eye pain, or other eye problems, seek medical attention at once for an eye exam. The use of this medicine may increase the chance of suicidal thoughts or actions. Pay special attention to how you are responding while on this medicine. Any worsening of mood, or thoughts of suicide or dying should be reported to your health care professional right away. This medicine may increase the chance of developing metabolic acidosis. If left untreated, this can cause kidney stones, bone disease, or slowed growth in children. Symptoms include breathing fast, fatigue, loss of appetite, irregular  heartbeat, or loss of consciousness. Call your doctor immediately if you experience any of these side effects. Also, tell your doctor about any surgery you plan on having while taking this medicine since this may increase your risk for metabolic acidosis. Birth control pills may not work properly while you are taking this medicine. Talk to your doctor about using an extra method of birth control. Women who become pregnant while using this medicine may enroll in the [place] Antiepileptic Drug Pregnancy Registry by calling [phone]. This registry collects information about the safety of antiepileptic drug use during pregnancy. What side effects may I notice from receiving this medicine? Side effects that you should report to your doctor or health care professional as soon as possible: -allergic reactions like skin rash, itching or hives, swelling of the face, lips, or tongue -decreased sweating and/or rise in body temperature -depression -difficulty breathing, fast or irregular breathing patterns -difficulty speaking -difficulty walking or controlling muscle movements -hearing impairment -redness, blistering, peeling or loosening of the skin, including inside the mouth -tingling, pain or numbness in the hands or feet -unusual bleeding or bruising -unusually weak or tired -worsening of mood, thoughts or actions of suicide or dying Side effects that usually do not require medical attention (report to your doctor or health care professional if they continue or are bothersome): -altered taste -back pain, joint or muscle aches and pains -diarrhea,  or constipation -headache -loss of appetite -nausea -stomach upset, indigestion -tremors This list may not describe all possible side effects. Call your doctor for medical advice about side effects. You may report side effects to FDA at 1-800-FDA-1088. Where should I keep my medicine? Keep out of the reach of children. Store at room  temperature between 15 and 30 degrees C (59 and 86 degrees F) in a tightly closed container. Protect from moisture. Throw away any unused medicine after the expiration date. NOTE: This sheet is a summary. It may not cover all possible information. If you have questions about this medicine, talk to your doctor, pharmacist, or health care provider.  2018 Elsevier/Gold Standard (2013-01-25 23:17:57)

## 2016-10-17 NOTE — Progress Notes (Signed)
[name]    Provider:  Dr Jaynee Eagles Referring Provider: Boykin Nearing, MD Primary Care Physician:  Ladell Pier, MD  CC:  Headache, hypophysitis.  Interval history 10/17/2016: Patient is here for follow up. 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 She has daily headaches with pressure, worse laying down when she can hear her heartbeat. No new vision changes but does have sensitivity to light and sounds. +nause and vomiting. LP was unable to measure opening pressure, she declines further LP. Will repeat imaging to follow hypophysitis.  HPI:  Kristen [name] is a 31 y.o. 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 She has PCOS and hyperprolactinemia. MRI of the brain was performed due to elevated prolactin. She ha headaches daily, migraines 10 days a month.Daily headaches are more dull an din the front of the forehead and sometimes pressure all around the head, head feels full, when she sometimes hears her heart beat, she feels there is something in her ears. The migraines feel like intense pressure across the forehead, sometimes throbbing, light and sound sensitivity, nausea. Feels like she has to vomit. Mother has migraines. Can be painful, can last all day up to 3 days. Didn't feel better after draining fluid. She sometimes wakes with headaches, she snores a lot heavily, mother has noticed she stops breathing in the middle of the night. She takes naproxen but not more than 10x a month, ice packs and quiet help. She has had a recent TB neg. she has blurry vision, no frank double vision although her vision does get blurry.No other focal neurologic deficits, associated symptoms, inciting events or modifiable factors.    Reviewed notes, labs and imaging from outside physicians, which showed:  Personally reviewed MRI of the brain images 07/14/2016 and agree with the  following: 1. No focal pituitary lesion. 2. Thickening and enhancement of the pituitary stalk. 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 Pituitary adenoma can extending to the pituitary stalk. Granulomatous disease including neurosarcoidosis can have a similar appearance. No other basilar enhancement is evident to suggest granulomatous disease. Follow-up imaging after treatment could be performed for further evaluation.Review of Systems:  Patient complains of symptoms per HPI as well as the following symptoms: headaches. Pertinent negatives and positives per HPI. All others negative.   Social History   Social History  . Marital status: Single    Spouse name: N/A  . Number of children: 0  . Years of education: college    Occupational History  . Unemployed     Social History Main Topics  . Smoking status: Never Smoker  . Smokeless tobacco: Never Used  . Alcohol use No  . Drug use: No  . Sexual activity: Yes    Birth control/ protection: None   Other Topics Concern  . Not on file   Social History Narrative   Lives with mom [name])    Right-handed   Caffeine: 3 cups of coffee per day    Family History  Problem Relation Age of Onset  . Hypertension Mother   . Arnold-Chiari malformation Mother   . Restless legs syndrome Mother   . Osteoporosis Mother   . COPD Mother   . Asthma Mother   . Squamous cell carcinoma Mother        skin   . Eczema Mother   . Arthritis Mother   . Peripheral Artery Disease Mother   . Hypertension Father   .  Scoliosis Father   . Bipolar disorder Father   . Congestive Heart Failure Father   . Diabetes Maternal Grandmother   . Hypertension Maternal Grandmother   . Cancer Paternal Grandmother        colon  . Bone cancer Maternal Grandfather 82       bone marrow   . Multiple myeloma Maternal Grandfather   . Diabetes Paternal Grandfather     Past Medical History:  Diagnosis Date  . Anxiety 2013   treated at [facility] by  Pauline Good   . Asthma 1989    no hospitalization, or intubation, previously on advair and singulair. asthma worsening.   . 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 Depression 2001   depressed since childhood   . 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 Kyphosis of thoracic region 2004    painful, runs in dad side   . Migraine   . Pancreatitis 2003   gallstone pancreatitis   . PCOS (polycystic ovarian syndrome) 2014   facial hair, irregular periods, never been pregnant     Past Surgical History:  Procedure Laterality Date  . CHOLECYSTECTOMY  2003   . OPEN REDUCTION INTERNAL FIXATION (ORIF) PROXIMAL PHALANX Right 07/23/2013   Procedure: OPEN TREATMENT RIGHT RING FINGER, PROXIMAL INTERPHALANGEAL/JOINT FRACTURE DISLOCATION;  Surgeon: Jolyn Nap, MD;  Location: [facility];  Service: Orthopedics;  Laterality: Right;  . TONSILLECTOMY      Current Outpatient Prescriptions  Medication Sig Dispense Refill  . albuterol (PROVENTIL HFA;VENTOLIN HFA) 108 (90 BASE) MCG/ACT inhaler Inhale 2 puffs into the lungs every 6 (six) hours as needed for wheezing or shortness of breath. 6.7 g 3  . bromocriptine (PARLODEL) 2.5 MG tablet Take 0.5 tablets (1.25 mg total) by mouth at bedtime. 15 tablet 11  . hydrOXYzine (VISTARIL) 50 MG capsule Take 1 capsule (50 mg total) by mouth 3 (three) times daily as needed. 60 capsule 0  . metFORMIN (GLUCOPHAGE-XR) 500 MG 24 hr tablet Take 1-2 tablets (500-1,000 mg total) by mouth daily after supper. Start with 500 mg for one week, then increase to 1000 mg 60 tablet 2  . Multiple Vitamin (MULTIVITAMIN) tablet Take 1 tablet by mouth daily.    . naproxen (NAPROSYN) 500 MG tablet TAKE 1 TABLET BY MOUTH 2 TIMES DAILY WITH A MEAL. 30 tablet 0  . sertraline (ZOLOFT) 100 MG tablet Take 100 mg by mouth daily.    Marland Kitchen topiramate (TOPAMAX) 25 MG tablet 8m for one week, 569mfor one week, 7578mor one week then 100m21m0 tablet 3   No current  facility-administered medications for this visit.     Allergies as of 10/17/2016 - Review Complete 10/17/2016  Allergen Reaction Noted  . Mucinex [guaifenesin er] Hives, Itching, and Swelling 08/27/2011  . Penicillins Swelling 08/23/2016  . Contrast media [iodinated diagnostic agents] Nausea And Vomiting 08/23/2016  . Multihance [gadobenate] Nausea And Vomiting 07/14/2016    Vitals: BP 114/81   Pulse 91   Ht _0  (1.676 m)   Wt 299 lb 12.8 oz (136 kg)   BMI 48.39 kg/m  Last Weight:  Wt Readings from Last 1 Encounters:  10/17/16 299 lb 12.8 oz (136 kg)   Last Height:   Ht Readings from Last 1 Encounters:  10/17/16 _1  (1.676 m)     Physical exam: Exam: Gen: NAD, conversant, well nourised, obese, well groomed  CV: RRR, no MRG. No Carotid Bruits. No peripheral edema, warm, nontender Eyes: Conjunctivae clear without exudates or hemorrhage  Neuro: Detailed Neurologic Exam  Speech:    Speech is normal; fluent and spontaneous with normal comprehension.  Cognition:    The patient is oriented to person, place, and time;     recent and remote memory intact;     language fluent;     normal attention, concentration,     fund of knowledge Cranial Nerves:    The pupils are equal, round, and reactive to light. The fundi are normal and spontaneous venous pulsations are present. Visual fields are full to finger confrontation. Extraocular movements are intact. Trigeminal sensation is intact and the muscles of mastication are normal. The face is symmetric. The palate elevates in the midline. Hearing intact. Voice is normal. Shoulder shrug is normal. The tongue has normal motion without fasciculations.   Coordination:    Normal finger to nose and heel to shin. Normal rapid alternating movements.   Gait:    Heel-toe and tandem gait are normal.   Motor Observation:    No asymmetry, no atrophy, and no involuntary movements noted. Tone:    Normal muscle tone.      Posture:    Posture is normal. normal erect    Strength:    Strength is V/V in the upper and lower limbs.      Sensation: intact to LT     Reflex Exam:  DTR's:    Deep tendon reflexes in the upper and lower extremities are normal bilaterally.   Toes:    The toes are downgoing bilaterally.   Clonus:    Clonus is absent.     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   MRI brain w/wo contrast with pituitary protocol to follow jypophysitis and due to positional headaches to eval for IIH or other lesions MRI orbits to look for other etiologies of headache such as orbital pseudotumore Recommend ophthalmology for visual field testing and they have not been able to see them due to finances. May consider repeat LP Sleep evaluation scheduled in a few weeks   As far as your medications are concerned, I would like to suggest:  Start Topiramate, discussed teratogenicity  Orders Placed This Encounter  Procedures  . MR BRAIN W WO CONTRAST  . 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 CSF was normal (protein, glucose, cells, oligoclonal bands, culture, cytology). Will treat with an 8 week course of high-dose steroids with the taper and at that time we'll repeat imaging. Discussed steroid use and side effects, watch for insomnia, hyperglycemia, hypertension, fluid and weight gain, and other side effects as per patient instructions and call for anything concerning. - Snoring, gasps for air in the middle the  night, extremely: Sleep eval for obstructive sleep apnea or hypoventilation syndrome - Morbid obesity: Healthy weight and wellness center - Migraines: May be exacerbated by OSA, need MRI of brain and orbits to eval for IIH and interval improvement of lymphocytic hypophysitis after treatment. We'll start topiramate at this time. 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 [place], [place] [address]  Phone [phone] Fax [phone]  A total of 40 minutes was spent face-to-face with this patient. Over half this time was spent on counseling patient on the migraine, headache, hypophysitis diagnosis and different diagnostic and therapeutic options available.

## 2016-10-18 MED FILL — BROMOCRIPTINE 2.5 MG TABLET: 2.5 | 60 days supply | Qty: 30 | Fill #0

## 2016-10-22 ENCOUNTER — Ambulatory Visit: Payer: Self-pay | Admitting: Obstetrics & Gynecology

## 2016-11-01 ENCOUNTER — Ambulatory Visit
Admission: RE | Admit: 2016-11-01 | Discharge: 2016-11-01 | Disposition: A | Discharge status: Home / Self Care | Payer: PRIVATE HEALTH INSURANCE | Source: Ambulatory Visit | Attending: Neurology | Admitting: Neurology

## 2016-11-01 DIAGNOSIS — R519 Headache, unspecified: Secondary | ICD-10-CM

## 2016-11-01 DIAGNOSIS — R51 Headache with orthostatic component, not elsewhere classified: Secondary | ICD-10-CM

## 2016-11-01 DIAGNOSIS — G43009 Migraine without aura, not intractable, without status migrainosus: Secondary | ICD-10-CM

## 2016-11-01 DIAGNOSIS — E236 Other disorders of pituitary gland: Secondary | ICD-10-CM

## 2016-11-01 DIAGNOSIS — H547 Unspecified visual loss: Secondary | ICD-10-CM

## 2016-11-01 DIAGNOSIS — H05119 Granuloma of unspecified orbit: Secondary | ICD-10-CM

## 2016-11-01 MED ORDER — GADOBENATE DIMEGLUMINE 529 MG/ML IV SOLN: 20.0000 mL | Freq: Once | INTRAVENOUS | Status: DC | PRN

## 2016-11-04 ENCOUNTER — Telehealth: Payer: Self-pay

## 2016-11-04 NOTE — Telephone Encounter (Signed)
I called pt, spoke to pt's mother Steward Drone, per [facility].  Pt's mother is worried about the pituitary stalk. I encouraged pt's mother to have the pt follow back up with endocrinology regarding the pituitary stalk. I asked pt's mother to ask the pt send Dr. Lucia Gaskins a mychart message letting her know how that pt is doing on the topamax and how her headaches are. Pt's mother will relay this message to the pt.

## 2016-11-04 NOTE — Telephone Encounter (Signed)
I called pt, spoke to pt's mother, Steward Drone, per [facility]. I advised pt's mother that the MRI brain is unchanged, pituitary stalk thickening is stable, no changes. Dr. Lucia Gaskins recommends another MRI in 6-12 months unless pt has new symptoms. Pt's mother is asking what Dr. Lucia Gaskins recommends from here? Will pt be able to work? What is causing this? I reminded pt's mother of pt's sleep consult with Dr. Frances Furbish on 11/07/16 and of pt's appt with Aundra Millet, NP in December of 2018. Pt's mother still has many questions about where to go from here, since the MRI was unchanged.

## 2016-11-04 NOTE — Telephone Encounter (Signed)
Belenda Cruise, is she talking about the headaches or the pituitary findings? When she says "what is causing this", which part is she asking about?  As far as the headaches, she has an appt with Dr Frances Furbish to evaluate for sleep apnea. We also started her on Topiramate at last appointment and patient can email me and tell me how she is doing and how her headaches are, patient should do it not mother.  As far as the pituitary stalk, they follow in endocrinology thanks

## 2016-11-04 NOTE — Telephone Encounter (Signed)
-----   Message from Anson Fret, MD sent at 11/03/2016  9:52 AM EDT ----- MRI of the brain is unchanged the the pituitary stalk thickening is stable, no changes. Will continue to monitor with another MRI in 6-12 months unless she develops new symptoms thanks

## 2016-11-07 ENCOUNTER — Ambulatory Visit (INDEPENDENT_AMBULATORY_CARE_PROVIDER_SITE_OTHER): Payer: PRIVATE HEALTH INSURANCE | Admitting: Neurology

## 2016-11-07 ENCOUNTER — Encounter: Payer: Self-pay | Admitting: Neurology

## 2016-11-07 VITALS — BP 118/86 | HR 92 | Ht 66.5 in | Wt 300.0 lb

## 2016-11-07 DIAGNOSIS — Z6841 Body Mass Index (BMI) 40.0 and over, adult: Secondary | ICD-10-CM

## 2016-11-07 DIAGNOSIS — G4719 Other hypersomnia: Secondary | ICD-10-CM

## 2016-11-07 DIAGNOSIS — R519 Headache, unspecified: Secondary | ICD-10-CM

## 2016-11-07 DIAGNOSIS — R351 Nocturia: Secondary | ICD-10-CM

## 2016-11-07 DIAGNOSIS — R51 Headache: Secondary | ICD-10-CM

## 2016-11-07 DIAGNOSIS — J4520 Mild intermittent asthma, uncomplicated: Secondary | ICD-10-CM

## 2016-11-07 DIAGNOSIS — R0683 Snoring: Secondary | ICD-10-CM

## 2016-11-07 DIAGNOSIS — G2581 Restless legs syndrome: Secondary | ICD-10-CM

## 2016-11-07 DIAGNOSIS — G4761 Periodic limb movement disorder: Secondary | ICD-10-CM

## 2016-11-07 NOTE — Patient Instructions (Signed)

## 2016-11-07 NOTE — Progress Notes (Signed)
Subjective:    Patient ID: Kristen [name] is a 31 y.o. female.  HPI     Star Age, MD, PhD [facility] [address] [place], [place] [address]  Dear Berta Minor,   I saw your patient, Kristen [name], upon your kind request in my clinic today for initial consultation of her sleep disorder, in particular, concern for underlying obstructive sleep apnea. The patient is accompanied by her mother today. Of note, patient no showed for an appointment on 10/09/2016. As you know, Kristen [name] is a 31 year old right-handed woman with an underlying medical history of anxiety, asthma, congenital third kidney present, depression, history of PVCs, migraine headaches, PCOS, and morbid obesity with a BMI of over 45, who reports snoring and excessive daytime somnolence. I reviewed your office note from 09/05/2016. BT is around MN and WT around 10 currently. She has occ. AM HAs, nocturia about 1-2/night. She has occasional RLS Sx and is known to move her feet in her sleep, per mom; mother has RLS.  She has bruxism and has an OTC guard, but has not molded it yet.  Her Epworth sleepiness score is 4 out of 24 today, fatigue score is 49 out of 63. She lives with her mother. She has no children. She is a nonsmoker and drinks alcohol occasionally, maybe twice a month, she drinks caffeine in the form of coffee, tea and soda, several servings per day.  She does nap daily at this time. 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  She is engaged, recently graduadet. Per mom, she snores and dad has OSA, on CPAP. She had a TE as a child.   Her Past Medical History Is Significant For: Past Medical History:  Diagnosis Date  . Anxiety 2013   treated at [facility] by Pauline Good   . Asthma 1989    no hospitalization, or intubation, previously on advair and singulair. asthma worsening.   . 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 Depression 2001    depressed since childhood   . 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 Kyphosis of thoracic region 2004    painful, runs in dad side   . Migraine   . Pancreatitis 2003   gallstone pancreatitis   . PCOS (polycystic ovarian syndrome) 2014   facial hair, irregular periods, never been pregnant     Her Past Surgical History Is Significant For: Past Surgical History:  Procedure Laterality Date  . CHOLECYSTECTOMY  2003   . OPEN REDUCTION INTERNAL FIXATION (ORIF) PROXIMAL PHALANX Right 07/23/2013   Procedure: OPEN TREATMENT RIGHT RING FINGER, PROXIMAL INTERPHALANGEAL/JOINT FRACTURE DISLOCATION;  Surgeon: Jolyn Nap, MD;  Location: [place];  Service: Orthopedics;  Laterality: Right;  . TONSILLECTOMY      Her Family History Is Significant For: Family History  Problem Relation Age of Onset  . Hypertension Mother   . Arnold-Chiari malformation Mother   . Restless legs syndrome Mother   . Osteoporosis Mother   . COPD Mother   . Asthma Mother   . Squamous cell carcinoma Mother        skin   . Eczema Mother   . Arthritis Mother   . Peripheral Artery Disease Mother   . Hypertension Father   . Scoliosis Father   . Bipolar disorder Father   . Congestive Heart Failure Father   . Diabetes Maternal Grandmother   . Hypertension Maternal  Grandmother   . Cancer Paternal Grandmother        colon  . Bone cancer Maternal Grandfather 82       bone marrow   . Multiple myeloma Maternal Grandfather   . Diabetes Paternal Grandfather     Her Social History Is Significant For: Social History   Social History  . Marital status: Single    Spouse name: N/A  . Number of children: 0  . Years of education: college    Occupational History  . Unemployed     Social History Main Topics  . Smoking status: Never Smoker  . Smokeless tobacco: Never Used  . Alcohol use No  . Drug use: No  . Sexual activity: Yes    Birth control/ protection: None   Other Topics Concern  . None    Social History Narrative   Lives with mom [name])    Right-handed   Caffeine: 3 cups of coffee per day    Her Allergies Are:  Allergies  Allergen Reactions  . Mucinex [Guaifenesin Er] Hives, Itching and Swelling  . Penicillins Swelling  . Contrast Media [Iodinated Diagnostic Agents] Nausea And Vomiting  . Multihance [Gadobenate] Nausea And Vomiting  :   Her Current Medications Are:  Outpatient Encounter Prescriptions as of 11/07/2016  Medication Sig  . albuterol (PROVENTIL HFA;VENTOLIN HFA) 108 (90 BASE) MCG/ACT inhaler Inhale 2 puffs into the lungs every 6 (six) hours as needed for wheezing or shortness of breath.  . bromocriptine (PARLODEL) 2.5 MG tablet Take 0.5 tablets (1.25 mg total) by mouth at bedtime.  . hydrOXYzine (VISTARIL) 50 MG capsule Take 1 capsule (50 mg total) by mouth 3 (three) times daily as needed.  . metFORMIN (GLUCOPHAGE-XR) 500 MG 24 hr tablet Take 1-2 tablets (500-1,000 mg total) by mouth daily after supper. Start with 500 mg for one week, then increase to 1000 mg  . Multiple Vitamin (MULTIVITAMIN) tablet Take 1 tablet by mouth daily.  . naproxen (NAPROSYN) 500 MG tablet TAKE 1 TABLET BY MOUTH 2 TIMES DAILY WITH A MEAL.  Marland Kitchen sertraline (ZOLOFT) 100 MG tablet Take 100 mg by mouth daily.  Marland Kitchen topiramate (TOPAMAX) 25 MG tablet 98m for one week, 510mfor one week, 7511mor one week then 100m50mNo facility-administered encounter medications on file as of 11/07/2016.   :  Review of Systems:  Out of a complete 14 point review of systems, all are reviewed and negative with the exception of these symptoms as listed below:  Review of Systems  Neurological:       Pt presents today to discuss her sleep. Pt has never had a sleep study. Pt does endorse snoring.  Epworth Sleepiness Scale 0= would never doze 1= slight chance of dozing 2= moderate chance of dozing 3= high chance of dozing  Sitting and reading: 0 Watching TV: 0 Sitting inactive in a public place  (ex. Theater or meeting): 0 As a passenger in a car for an hour without a break: 2 Lying down to rest in the afternoon: 2 Sitting and talking to someone: 0 Sitting quietly after lunch (no alcohol): 0 In a car, while stopped in traffic: 0 Total: 4     Objective:  Neurological Exam  Physical Exam Physical Examination:   Vitals:   11/07/16 0901  BP: 118/86  Pulse: 92    General Examination: The patient is a very pleasant 31 y66. female in no acute distress. She appears well-developed and well-nourished and well groomed.  HEENT: Normocephalic, atraumatic, pupils are equal, round and reactive to light and accommodation. Extraocular tracking is good without limitation to gaze excursion or nystagmus noted. Normal smooth pursuit is noted. Hearing is grossly intact. Face is symmetric with normal facial animation and normal facial sensation. Speech is clear with no dysarthria noted. There is no hypophonia. There is no lip, neck/head, jaw or voice tremor. Neck is supple with full range of passive and active motion. There are no carotid bruits on auscultation. Oropharynx exam reveals: mild mouth dryness, adequate dental hygiene and mild airway crowding, due to smaller airway entry. Mallampati is class II. Tongue protrudes centrally and palate elevates symmetrically. Tonsils are absent. Neck size is 15.75 inches. She has a Mild overbite.   Chest: Clear to auscultation without wheezing, rhonchi or crackles noted.  Heart: S1+S2+0, regular and normal without murmurs, rubs or gallops noted.   Abdomen: Soft, non-tender and non-distended with normal bowel sounds appreciated on auscultation.  Extremities: There is no pitting edema in the distal lower extremities bilaterally. Pedal pulses are intact.  Skin: Warm and dry without trophic changes noted.  Musculoskeletal: exam reveals no obvious joint deformities, tenderness or joint swelling or erythema.   Neurologically:  Mental status: The patient  is awake, alert and oriented in all 4 spheres. Her immediate and remote memory, attention, language skills and fund of knowledge are appropriate. There is no evidence of aphasia, agnosia, apraxia or anomia. Speech is clear with normal prosody and enunciation. Thought process is linear. Mood is normal and affect is normal.  Cranial nerves II - XII are as described above under HEENT exam. In addition: shoulder shrug is normal with equal shoulder height noted. Motor exam: Normal bulk, strength and tone is noted. There is no drift, tremor or rebound. Romberg is negative. Reflexes are 2+ throuout. Fine motor skills and coordination: intact with normal finger taps, normal hand movements, normal rapid alternating patting, normal foot taps and normal foot agility.  Cerebellar testing: No dysmetria or intention tremor on finger to nose testing. Heel to shin is unremarkable bilaterally. There is no truncal or gait ataxia.  Sensory exam: intact to light touch in the upper and lower extremities.  Gait, station and balance: She stands easily. No veering to one side is noted. No leaning to one side is noted. Posture is age-appropriate and stance is narrow based. Gait shows normal stride length and normal pace. No problems turning are noted. Tandem walk is unremarkable.   Assessment and Plan:   In summary, Kristen [name] is a very pleasant 31 y.o.-year old female with an underlying medical history of anxiety, asthma, congenital third kidney present, depression, history of PVCs, migraine headaches, PCOS, and morbid obesity with a BMI of over 90, whose history and physical exam is concerning for obstructive sleep apnea (OSA). She also endorses intermittent RLS symptoms and mom reports that she moves her feet in her sleep. I had a long chat with the patient and her mother about my findings and the diagnosis of OSA, its prognosis and treatment options. We talked about medical treatments, surgical interventions and  non-pharmacological approaches. I explained in particular the risks and ramifications of untreated moderate to severe OSA, especially with respect to developing cardiovascular disease down the Road, including congestive heart failure, difficult to treat hypertension, cardiac arrhythmias, or stroke. Even type 2 diabetes has, in part, been linked to untreated OSA. Symptoms of untreated OSA include daytime sleepiness, memory problems, mood irritability and mood disorder such as depression and anxiety,  lack of energy, as well as recurrent headaches, especially morning headaches. We talked about trying to maintain a healthy lifestyle in general, as well as the importance of weight control. I encouraged the patient to eat healthy, exercise daily and keep well hydrated, to keep a scheduled bedtime and wake time routine, to not skip any meals and eat healthy snacks in between meals. I advised the patient not to drive when feeling sleepy. I recommended the following at this time: sleep study with potential positive airway pressure titration. (We will score hypopneas at 3%).   I explained the sleep test procedure to the patient and also outlined possible surgical and non-surgical treatment options of OSA, including the use of a custom-made dental device (which would require a referral to a specialist dentist or oral surgeon), upper airway surgical options, such as pillar implants, radiofrequency surgery, tongue base surgery, and UPPP (which would involve a referral to an ENT surgeon). Rarely, jaw surgery such as mandibular advancement may be considered.  I also explained the CPAP treatment option to the patient, who indicated that she would be willing to try CPAP if the need arises. I explained the importance of being compliant with PAP treatment, not only for insurance purposes but primarily to improve Her symptoms, and for the patient's long term health benefit, including to reduce Her cardiovascular risks. I answered  all their questions today and the patient and her mother were in agreement. I would like to see her back after the sleep study is completed and encouraged her to call with any interim questions, concerns, problems or updates.   Thank you very much for allowing me to participate in the care of this nice patient. If I can be of any further assistance to you please do not hesitate to talk to me.  Sincerely,   Star Age, MD, PhD

## 2016-11-08 ENCOUNTER — Telehealth: Payer: Self-pay | Admitting: Neurology

## 2016-11-08 NOTE — Telephone Encounter (Signed)
Pt's mother said she is returning a call reg sleep study?

## 2016-11-11 NOTE — Telephone Encounter (Signed)
Pt's sleep study has not been scheduled yet. Will forward to [facility] for scheduling.

## 2016-11-25 ENCOUNTER — Encounter: Payer: Self-pay | Admitting: Obstetrics & Gynecology

## 2016-11-25 ENCOUNTER — Ambulatory Visit (INDEPENDENT_AMBULATORY_CARE_PROVIDER_SITE_OTHER): Payer: PRIVATE HEALTH INSURANCE | Admitting: Obstetrics & Gynecology

## 2016-11-25 VITALS — BP 110/84 | HR 84 | Ht 66.5 in | Wt 294.4 lb

## 2016-11-25 DIAGNOSIS — E282 Polycystic ovarian syndrome: Secondary | ICD-10-CM

## 2016-11-25 MED ORDER — METFORMIN HCL 1000 MG PO TABS: ORAL_TABLET | ORAL | 5 refills | Status: DC

## 2016-11-25 NOTE — Progress Notes (Signed)
GYNECOLOGY OFFICE VISIT NOTE  History:  31 y.o. G0P0 here today for follow up of PCOS management.  During last visit, we discussed increasing Metformin dose to 1500 mg daily from 500 daily; she only increased this to 1000 mg for now. No side effects. Periods are more regular lately. Not using ovulation predictor kits.  She denies any abnormal vaginal discharge, bleeding, pelvic pain or other concerns. Very interested in getting pregnant.  Past Medical History:  Diagnosis Date  . Anxiety 2013   treated at [facility] by Pauline Good   . Asthma 1989    no hospitalization, or intubation, previously on advair and singulair. asthma worsening.   . 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 Depression 2001   depressed since childhood   . 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 Kyphosis of thoracic region 2004    painful, runs in dad side   . Migraine   . Pancreatitis 2003   gallstone pancreatitis   . PCOS (polycystic ovarian syndrome) 2014   facial hair, irregular periods, never been pregnant     Past Surgical History:  Procedure Laterality Date  . CHOLECYSTECTOMY  2003   . OPEN REDUCTION INTERNAL FIXATION (ORIF) PROXIMAL PHALANX Right 07/23/2013   Procedure: OPEN TREATMENT RIGHT RING FINGER, PROXIMAL INTERPHALANGEAL/JOINT FRACTURE DISLOCATION;  Surgeon: Jolyn Nap, MD;  Location: [place];  Service: Orthopedics;  Laterality: Right;  . TONSILLECTOMY      The following portions of the patient's history were reviewed and updated as appropriate: allergies, current medications, past family history, past medical history, past social history, past surgical history and problem list.   Health Maintenance:  Normal pap on 03/25/2014.  Review of Systems:  Pertinent items noted in HPI and remainder of comprehensive ROS otherwise negative.   Objective:  Physical Exam BP 110/84   Pulse 84   Ht 5' 6.5" (1.689 m)   Wt 294 lb 6.4 oz (133.5 kg)   LMP 11/11/2016   BMI  46.81 kg/m  CONSTITUTIONAL: Well-developed, well-nourished female in no acute distress.  HENT:  Normocephalic, atraumatic. External right and left ear normal. Oropharynx is clear and moist EYES: Conjunctivae and EOM are normal. Pupils are equal, round, and reactive to light. No scleral icterus.  NECK: Normal range of motion, supple, no masses SKIN: Skin is warm and dry. No rash noted. Not diaphoretic. No erythema. No pallor. NEUROLOGIC: Alert and oriented to person, place, and time. Normal reflexes, muscle tone coordination. No cranial nerve deficit noted. PSYCHIATRIC: Normal mood and affect. Normal behavior. Normal judgment and thought content. CARDIOVASCULAR: Normal heart rate noted RESPIRATORY: Effort and breath sounds normal, no problems with respiration noted ABDOMEN: Soft, no distention noted.   PELVIC: Deferred MUSCULOSKELETAL: Normal range of motion. No edema noted.  Labs and Imaging Mr Jeri Cos [facility] Contrast  Result Date: 11/02/2016  [facility] [address], [place], [place] [address] [phone] NEUROIMAGING REPORT STUDY DATE: 11/01/2016 PATIENT NAME: ELNER SEIFERT DOB: 14-Apr-1985 MRN: [id number] EXAM: MRI Brain with and without contrast with added attention to the pituitary gland. ORDERING CLINICIAN: Sarina Ill M.D. CLINICAL HISTORY: 31 year old woman with headache and vision changes COMPARISON FILMS: None TECHNIQUE:MRI of the brain with and without contrast was obtained utilizing 5 mm axial slices with T1, T2, T2 flair, SWI and diffusion weighted views.  T1 sagittal, T2 coronal and postcontrast views in the axial and coronal plane were obtained.  Additional thin section  dynamic postcontrast images through the pituitary gland were performed in the coronal plane. CONTRAST: 20 ml Multihance IMAGING SITE: [facility], [place]. FINDINGS: On sagittal images, the spinal cord is imaged caudally to C4 and is normal in caliber.   The contents of  the posterior fossa are of normal size and position.   The pituitary stalk is thickened with homogenous enhancement but the pituitary gland appears normal. There is no change compared to the 07/14/2016 MRI. The optic chiasm appear normal.    Brain volume appears normal.   The ventricles are normal in size and without distortion.  There are no abnormal extra-axial collections of fluid.  The cerebellum and brainstem appears normal.   The deep gray matter appears normal.  The cerebral hemispheres appear normal.   Diffusion weighted images are normal.  Susceptibility weighted images are normal.   The orbits appear normal.   The VIIth/VIIIth nerve complex appears normal.  The mastoid air cells appear normal.  There is mucoperiosteal thickening and retention cysts in the maxillary sinus mucoperiosteal thickening of many of the ethmoid air cells. The other paranasal sinuses are normal..  Flow voids are identified within the major intracerebral arteries.  After the infusion of contrast material, a normal enhancement pattern is noted.    This MRI of the brain with and without contrast shows the following: 1.     Brain parenchyma appears normal before and after contrast. 2.     The pituitary stalk is thickened.    This could be incidental, but could also occur with inflammatory disorder such as lymphocytic infundibulo-hypophysitis, Langerhans cell histiocytosis, neurosarcoidosis another inflammatory etiologies.   The changes are stable when compared to the MRI dated 07/14/2016 making a neoplastic disorder less likely. 3.    Chronic maxillary and ethmoid sinusitis. INTERPRETING PHYSICIAN: Richard A. Felecia Shelling, MD, PhD, FAAN Certified in  Neuroimaging by [employer]   Mr Rosealee Albee [facility] Contrast  Result Date: 11/02/2016  [place], [place], [place] [address] [phone] NEUROIMAGING REPORT STUDY DATE: 11/01/2016 PATIENT NAME: RICKITA FORSTNER DOB: 1985/03/15 MRN:  [id number] EXAM: MRI of the orbits with and without contrast ORDERING CLINICIAN: Sarina Ill M.D. CLINICAL HISTORY: 31 year old woman with headaches and vision changes COMPARISON FILMS: MRI 07/14/2016 TECHNIQUE: MRI orbits with and without contrast obtained utilizing 3 mm thin cut slices in the axial and coronal planes with T1, T2, fat-saturated and postcontrast views. CONTRAST: 20 mL MultiHance IMAGING SITE: [facility], [place], [place], [place] FINDINGS: The globes, intraconal space, optic nerves and extraocular muscles demonstrate normal appearing anatomy. The optic chiasm appears normal. No abnormal enhancing, inflammatory or compressive lesions are seen within the orbits. Limited views of the brain parenchyma are unremarkable.   The pituitary stalk is widened and this is further described in the accompanying MRI of the brain.   There is chronic maxillary and ethmoid sinusitis.    This MRI of the orbits with and without contrast is normal. Incidental note is made of pituitary stoke enlargement that is described in the accompanying MRI of the brain. Additionally, there is chronic ethmoid and maxillary sinusitis. INTERPRETING PHYSICIAN: Richard A. Felecia Shelling, MD, PhD, FAAN Certified in  Neuroimaging by [employer] [place]:  1. PCOS (polycystic ovarian syndrome) Increased dosage of Metformin, labs checked today. - metFORMIN (GLUCOPHAGE) 1000 MG tablet; Take one tablet twice a day.  Dispense: 60 tablet; Refill: 5 - Comp Met (CMET) Advised  the following:  *Continue weight loss efforts *Use ovulation predictor kits (available in supermarkets) and ovulation apps (Flo and Clue) *Do research about Letrozole (Femara) for ovulation induction *Talk to partner about semen analysis *Talk to Neurologist about switching from Topamax (increased risk of fetal defects)   Return in about 1 month (around 12/26/2016) for Followup PCOS.   Total face-to-face time with  patient: 25 minutes. Over 50% of encounter was spent on counseling and coordination of care.   Verita Schneiders, MD, [place] Attending [place], [facility] for [employer], [place]

## 2016-11-25 NOTE — Patient Instructions (Addendum)
Increase Metformin to 1000 mg twice a day  Continue weight loss efforts  Use ovulation predictor kits (available in supermarkets) and ovulation apps (Flo and Clue)  Do research about Letrozole (Femara)   Talk to partner about semen analysis  Talk to Neurologist about switching from Topamax (increased risk of fetal defects)

## 2016-11-25 NOTE — Progress Notes (Signed)
Declines to see [facility] due to elevated phq9.

## 2016-11-26 LAB — COMPREHENSIVE METABOLIC PANEL
A/G RATIO: 1.8 (ref 1.2–2.2)
ALBUMIN: 4.4 g/dL (ref 3.5–5.5)
ALT: 31 IU/L (ref 0–32)
AST: 20 IU/L (ref 0–40)
Alkaline Phosphatase: 130 IU/L — ABNORMAL HIGH (ref 39–117)
BUN/Creatinine Ratio: 9 (ref 9–23)
BUN: 9 mg/dL (ref 6–20)
Bilirubin Total: 0.5 mg/dL (ref 0.0–1.2)
CALCIUM: 9.6 mg/dL (ref 8.7–10.2)
CO2: 23 mmol/L (ref 20–29)
CREATININE: 0.96 mg/dL (ref 0.57–1.00)
Chloride: 106 mmol/L (ref 96–106)
GFR, EST AFRICAN AMERICAN: 92 mL/min/{1.73_m2} (ref >59)
GFR, EST NON AFRICAN AMERICAN: 80 mL/min/{1.73_m2} (ref >59)
GLOBULIN, TOTAL: 2.4 g/dL (ref 1.5–4.5)
Glucose: 84 mg/dL (ref 65–99)
POTASSIUM: 4 mmol/L (ref 3.5–5.2)
SODIUM: 144 mmol/L (ref 134–144)
TOTAL PROTEIN: 6.8 g/dL (ref 6.0–8.5)

## 2016-12-05 ENCOUNTER — Ambulatory Visit (INDEPENDENT_AMBULATORY_CARE_PROVIDER_SITE_OTHER): Payer: Self-pay | Admitting: Neurology

## 2016-12-05 DIAGNOSIS — R519 Headache, unspecified: Secondary | ICD-10-CM

## 2016-12-05 DIAGNOSIS — G4761 Periodic limb movement disorder: Secondary | ICD-10-CM

## 2016-12-05 DIAGNOSIS — G4720 Circadian rhythm sleep disorder, unspecified type: Secondary | ICD-10-CM

## 2016-12-05 DIAGNOSIS — G4733 Obstructive sleep apnea (adult) (pediatric): Secondary | ICD-10-CM

## 2016-12-05 DIAGNOSIS — Z6841 Body Mass Index (BMI) 40.0 and over, adult: Secondary | ICD-10-CM

## 2016-12-05 DIAGNOSIS — J4520 Mild intermittent asthma, uncomplicated: Secondary | ICD-10-CM

## 2016-12-05 DIAGNOSIS — R9431 Abnormal electrocardiogram [ECG] [EKG]: Secondary | ICD-10-CM

## 2016-12-05 DIAGNOSIS — R351 Nocturia: Secondary | ICD-10-CM

## 2016-12-05 DIAGNOSIS — G2581 Restless legs syndrome: Secondary | ICD-10-CM

## 2016-12-05 DIAGNOSIS — R51 Headache: Secondary | ICD-10-CM

## 2016-12-05 DIAGNOSIS — G4719 Other hypersomnia: Secondary | ICD-10-CM

## 2016-12-05 DIAGNOSIS — R0683 Snoring: Secondary | ICD-10-CM

## 2016-12-07 ENCOUNTER — Emergency Department (HOSPITAL_BASED_OUTPATIENT_CLINIC_OR_DEPARTMENT_OTHER)
Admission: EM | Admit: 2016-12-07 | Discharge: 2016-12-08 | Disposition: A | Discharge status: Home / Self Care | Payer: PRIVATE HEALTH INSURANCE | Attending: Emergency Medicine | Admitting: Emergency Medicine

## 2016-12-07 ENCOUNTER — Encounter (HOSPITAL_BASED_OUTPATIENT_CLINIC_OR_DEPARTMENT_OTHER): Payer: Self-pay | Admitting: *Deleted

## 2016-12-07 DIAGNOSIS — F419 Anxiety disorder, unspecified: Secondary | ICD-10-CM | POA: Insufficient documentation

## 2016-12-07 DIAGNOSIS — Z7984 Long term (current) use of oral hypoglycemic drugs: Secondary | ICD-10-CM | POA: Insufficient documentation

## 2016-12-07 DIAGNOSIS — F329 Major depressive disorder, single episode, unspecified: Secondary | ICD-10-CM | POA: Insufficient documentation

## 2016-12-07 DIAGNOSIS — Z79899 Other long term (current) drug therapy: Secondary | ICD-10-CM | POA: Insufficient documentation

## 2016-12-07 DIAGNOSIS — J9801 Acute bronchospasm: Secondary | ICD-10-CM | POA: Insufficient documentation

## 2016-12-07 DIAGNOSIS — Z9049 Acquired absence of other specified parts of digestive tract: Secondary | ICD-10-CM | POA: Insufficient documentation

## 2016-12-07 MED ORDER — ALBUTEROL (5 MG/ML) CONTINUOUS INHALATION SOLN
INHALATION_SOLUTION | RESPIRATORY_TRACT | Status: AC
  Filled 2016-12-07: qty 2

## 2016-12-07 MED ORDER — ALBUTEROL (5 MG/ML) CONTINUOUS INHALATION SOLN
10.0000 mg/h | INHALATION_SOLUTION | RESPIRATORY_TRACT | Status: AC
  Administered 2016-12-07: 10 mg/h via RESPIRATORY_TRACT

## 2016-12-07 NOTE — ED Notes (Signed)
Alert, NAD, calm, interactive, resps labored, speaking short phrases, skin W&D, VSS, here for sob, wheezing and NP cough, (denies: pain, fever, NVD, congestion, dizziness or visual changes).

## 2016-12-07 NOTE — ED Triage Notes (Signed)
Pt c/o cough and congestion for past 3 days. Denies fevers. States hx of asthma. States she has been using her inhaler prn. States she became more sob today. Pt. Arrived via ems who reports pt received solumedrol 125mg  and Albuterol 10mg /0.5mg . Pt presents with wheezing and unable to complete a full sentence without becoming sob. MD at bedside on arrival and Resp at bedside to provide additional breathing treatment on arrival. VSS. No distress.

## 2016-12-07 NOTE — ED Provider Notes (Signed)
[facility] DEPT [facility] Provider Note: Kristen Spurling, MD, FACEP  CSN: [id number] MRN: [id number] ARRIVAL: 12/07/16 at [place]: [place]  12/07/16 11:30 PM Kristen [name] is a 31 y.o. female with a history of asthma.  She has had a 3-day history of worsening shortness of breath with wheezing.  She has also had nasal congestion and a nonproductive cough.  She is not sure if these are due to allergies or a viral illness.  She denies fever, nausea, vomiting and diarrhea.  Her breathing acutely worsened this evening and she called EMS.  They found her to have significant bronchospasm and she was administered 10 mg of albuterol and 0.5 mg of Atrovent by nebulization prior to arrival.  She experienced partial relief as a result of this.  She continues to be unable to speak in complete sentences.  She was also given 125 mg of Solu-Medrol IV.  She denies pain.   Past Medical History:  Diagnosis Date  . Anxiety 2013   treated at [facility] by Pauline Good   . Asthma 1989    no hospitalization, or intubation, previously on advair and singulair. asthma worsening.   . 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 Depression 2001   depressed since childhood   . 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 Kyphosis of thoracic region 2004    painful, runs in dad side   . Migraine   . Pancreatitis 2003   gallstone pancreatitis   . PCOS (polycystic ovarian syndrome) 2014   facial hair, irregular periods, never been pregnant     Past Surgical History:  Procedure Laterality Date  . CHOLECYSTECTOMY  2003   . TONSILLECTOMY      Family History  Problem Relation Age of Onset  . Hypertension Mother   . Arnold-Chiari malformation Mother   . Restless legs syndrome Mother   . Osteoporosis Mother   . COPD Mother   . Asthma Mother   . Squamous cell carcinoma Mother        skin   . Eczema Mother   . Arthritis Mother   .  Peripheral Artery Disease Mother   . Hypertension Father   . Scoliosis Father   . Bipolar disorder Father   . Congestive Heart Failure Father   . Diabetes Maternal Grandmother   . Hypertension Maternal Grandmother   . Cancer Paternal Grandmother        colon  . Bone cancer Maternal Grandfather 82       bone marrow   . Multiple myeloma Maternal Grandfather   . Diabetes Paternal Grandfather     Social History   Tobacco Use  . Smoking status: Never Smoker  . Smokeless tobacco: Never Used  Substance Use Topics  . Alcohol use: No  . Drug use: No    Prior to Admission medications   Medication Sig Start Date End Date Taking? Authorizing Provider  albuterol (PROVENTIL HFA;VENTOLIN HFA) 108 (90 BASE) MCG/ACT inhaler Inhale 2 puffs into the lungs every 6 (six) hours as needed for wheezing or shortness of breath. 03/07/14   Boykin Nearing, MD  bromocriptine (PARLODEL) 2.5 MG tablet Take 0.5 tablets (1.25 mg total) by mouth at bedtime. 10/11/16   Renato Shin, MD  hydrOXYzine (VISTARIL) 50 MG capsule Take 1 capsule (50 mg total) by mouth 3 (three) times daily as needed. 08/16/16   Funches,  Josalyn, MD  metFORMIN (GLUCOPHAGE) 1000 MG tablet Take one tablet twice a day. 11/25/16   Osborne Oman, MD  Multiple Vitamin (MULTIVITAMIN) tablet Take 1 tablet by mouth daily.    [provider]  naproxen (NAPROSYN) 500 MG tablet TAKE 1 TABLET BY MOUTH 2 TIMES DAILY WITH A MEAL. 08/29/16   Funches, Adriana Mccallum, MD  sertraline (ZOLOFT) 100 MG tablet Take 100 mg by mouth daily.    [provider]  topiramate (TOPAMAX) 25 MG tablet 57m for one week, 553mfor one week, 7573mor one week then 100m16m13/18   AherMelvenia Beam    Allergies Mucinex [guaifenesin er]; Penicillins; Contrast media [iodinated diagnostic agents]; and Multihance [gadobenate]   REVIEW OF SYSTEMS  Negative except as noted here or in the History of Present Illness.   PHYSICAL EXAMINATION  Initial Vital  Signs Blood pressure 115/86, pulse (!) 107, temperature 97.9 F (36.6 C), temperature source Oral, resp. rate (!) 24, last menstrual period 11/18/2016, SpO2 97 %.  Examination General: Well-developed, obese female in no acute distress; appearance consistent with age of record HENT: normocephalic; atraumatic Eyes: pupils equal, round and reactive to light; extraocular muscles intact Neck: supple Heart: regular rate and rhythm; tachycardia Lungs: Decreased air movement bilaterally with inspiratory and expiratory wheezing; unable to speak in full sentences Abdomen: soft; nondistended; nontender; bowel sounds present Extremities: No deformity; full range of motion; pulses normal Neurologic: Awake, alert and oriented; motor function intact in all extremities and symmetric; no facial droop Skin: Warm and dry Psychiatric: Mildly anxious   RESULTS  Summary of this visit's results, reviewed by myself:   EKG Interpretation  Date/Time:    Ventricular Rate:    PR Interval:    QRS Duration:   QT Interval:    QTC Calculation:   R Axis:     Text Interpretation:        Laboratory Studies: No results found for this or any previous visit (from the past 24 hour(s)). Imaging Studies: No results found.  ED COURSE  Nursing notes and initial vitals signs, including pulse oximetry, reviewed.  Vitals:   12/08/16 0045 12/08/16 0100 12/08/16 0115 12/08/16 0130  BP:      Pulse: (!) 114 (!) 121 (!) 106 (!) 105  Resp:      Temp:      TempSrc:      SpO2: 95% 98% 96% 95%   Lungs clear, patient speaking in full sentences.  She feels back to her baseline.  We will discharge her home on a 2-day course of dexamethasone.  We will also provide her an AeroChamber for her inhaler.  PROCEDURES    ED DIAGNOSES     ICD-10-CM   1. Acute bronchospasm J98.01        Keedan Sample, JohnJenny Reichmann 12/08/16 [id number] [id number]

## 2016-12-07 NOTE — ED Notes (Addendum)
RT at BS, neb initiated  

## 2016-12-08 NOTE — ED Notes (Addendum)
See Downtime paper charting for d/c documentation. D/c'd at 0213.

## 2016-12-08 NOTE — ED Notes (Signed)
Calmer, breathing easier. Tolerating CAT neb, denies questions or needs, VSS.

## 2016-12-10 ENCOUNTER — Telehealth: Payer: Self-pay | Admitting: Neurology

## 2016-12-10 ENCOUNTER — Encounter: Payer: Self-pay | Admitting: Endocrinology

## 2016-12-10 ENCOUNTER — Other Ambulatory Visit: Payer: Self-pay | Admitting: Neurology

## 2016-12-10 ENCOUNTER — Ambulatory Visit (INDEPENDENT_AMBULATORY_CARE_PROVIDER_SITE_OTHER): Payer: No Typology Code available for payment source | Admitting: Endocrinology

## 2016-12-10 VITALS — BP 128/96 | HR 94 | Wt 294.2 lb

## 2016-12-10 DIAGNOSIS — R7989 Other specified abnormal findings of blood chemistry: Secondary | ICD-10-CM

## 2016-12-10 DIAGNOSIS — G4733 Obstructive sleep apnea (adult) (pediatric): Secondary | ICD-10-CM

## 2016-12-10 DIAGNOSIS — E229 Hyperfunction of pituitary gland, unspecified: Secondary | ICD-10-CM

## 2016-12-10 NOTE — Telephone Encounter (Signed)
-----   Message from Huston FoleySaima Athar, MD sent at 12/10/2016  8:02 AM EST ----- Patient referred by Dr. Lucia GaskinsAhern, seen by me on 11/07/16, diagnostic PSG on 12/05/16.     Please call and notify the patient that the recent sleep study showed overall borderline obstructive sleep apnea, with an AHI of 5.2/hour and O2 nadir of 87%. The REM AHI of 26.9/hour was in the moderate range. Of note, the absence of supine sleep may underestimate her AHI and O2 nadir. The use of positive airway pressure in the form of CPAP or autoPAP can be considered. Other treatment options may include avoidance of supine sleep position along with weight loss, upper airway or jaw surgery in selected patients or the use of an oral appliance in certain patients. ENT evaluation and/or consultation with a maxillofacial surgeon or dentist may be feasible in some instances. Given her medical history and her sleep related complaints, treatment with autoPAP is a reasonable choice. I can order the machine and initial settings, if she is willing to proceed, please let me know. Thanks,   Huston FoleySaima Athar, MD, PhD [facility])

## 2016-12-10 NOTE — Patient Instructions (Addendum)
Please continue the same medications Please come back for a follow-up appointment in 1 year.  When you get insurance, you should consider the weight loss surgery.

## 2016-12-10 NOTE — Procedures (Signed)
PATIENT'S NAME:  Kristen [name], Kristen [name] DOB:      12/09/1985      MR#:    [id number]     DATE OF RECORDING: 12/05/2016 REFERRING M.D.: Dr. Naomie DeanAntonia [name],     PCP: Kristen PhiJosalyn Funches, MD Study Performed:   Baseline Polysomnogram HISTORY: 31 year old woman with a history of anxiety, asthma, congenital third kidney present, depression, history of PVCs, migraine headaches, PCOS, and morbid obesity, who reports snoring and excessive daytime somnolence. The patient endorsed the Epworth Sleepiness Scale at 4 points. The patient's weight 300 pounds with a height of 66 (inches), resulting in a BMI of 47.6 kg/m2. The patient's neck circumference measured 16 inches.  CURRENT MEDICATIONS: Proventil, Parlodel, Vistaril, Glucophage, Multivitamin, Naprosyn, Zoloft, Topomax   PROCEDURE:  This is a multichannel digital polysomnogram utilizing the Somnostar 11.2 system.  Electrodes and sensors were applied and monitored per AASM Specifications.   EEG, EOG, Chin and Limb EMG, were sampled at 200 Hz.  ECG, Snore and Nasal Pressure, Thermal Airflow, Respiratory Effort, CPAP Flow and Pressure, Oximetry was sampled at 50 Hz. Digital video and audio were recorded.      BASELINE STUDY  Lights Out was at 22:15 and Lights On at 04:50.  Total recording time (TRT) was 395.5 minutes, with a total sleep time (TST) of  264.5 minutes.   The patient's sleep latency was 77.5 minutes, which is markedly delayed. REM latency was 117.5 minutes, which is delayed. The sleep efficiency was 66.9 %, which is reduced.     SLEEP ARCHITECTURE: WASO (Wake after sleep onset) was 62 minutes.  There were 6 minutes in Stage N1, 161 minutes Stage N2, 68.5 minutes Stage N3 and 29 minutes in Stage REM.  The percentage of Stage N1 was 2.3%, Stage N2 was 60.9%, which is increased, Stage N3 was 25.9%, which is mildly increased, and Stage R (REM sleep) was 11.%, which is reduced. The arousals were noted as: 16 were spontaneous, 0 were associated with PLMs, 2 were  associated with respiratory events.    Audio and video analysis did not show any abnormal or unusual movements, behaviors, phonations or vocalizations. Intermittent coughing was noted; the patient reported having a cold or allergy symptoms. The patient took no bathroom breaks. Mild to moderate snoring was noted. EKG was in keeping with normal sinus rhythm (NSR) with occasional PVCs.  RESPIRATORY ANALYSIS:  There were a total of 23 respiratory events:  0 obstructive apneas, 0 central apneas and 0 mixed apneas with a total of 0 apneas and an apnea index (AI) of 0 /hour. There were 23 hypopneas with a hypopnea index of 5.2 /hour. The patient also had 0 respiratory event related arousals (RERAs).      The total APNEA/HYPOPNEA INDEX (AHI) was 5.2/hour and the total RESPIRATORY DISTURBANCE INDEX was 5.2 /hour.  13 events occurred in REM sleep and 20 events in NREM. The REM AHI was 26.9 /hour, versus a non-REM AHI of 2.5. The patient spent 0 minutes of total sleep time in the supine position and 265 minutes in non-supine.. The supine AHI was 0.0 versus a non-supine AHI of 5.2.  OXYGEN SATURATION & C02:  The Wake baseline 02 saturation was 98%, with the lowest being 87%. Time spent below 89% saturation equaled 7 minutes.  PERIODIC LIMB MOVEMENTS: The patient had a total of 0 Periodic Limb Movements.  The Periodic Limb Movement (PLM) index was 0 and the PLM Arousal index was 0/hour.  Post-study, the patient indicated that sleep was worse than usual.  IMPRESSION:  1. Obstructive Sleep Apnea (OSA) 2. Abnormal nonspecific EKG 3. Dysfunctions associated with sleep stages or arousal from sleep  RECOMMENDATIONS:  1. This sleep study shows overall borderline obstructive sleep apnea, with an AHI of 5.2/hour and O2 nadir of 87%. The REM AHI of 26.9/hour was in the moderate range. Of note, the absence of supine sleep may underestimate her AHI and O2 nadir. The use of positive airway pressure in the form of CPAP  or autoPAP can be considered and will be offered to the patient. Other treatment options may include avoidance of supine sleep position along with weight loss, upper airway or jaw surgery in selected patients or the use of an oral appliance in certain patients. ENT evaluation and/or consultation with a maxillofacial surgeon or dentist may be feasible in some instances. Given her medical history and her sleep related complaints, treatment with autoPAP is a reasonable choice.  2. This study shows sleep fragmentation and abnormal sleep stage percentages; these are nonspecific findings and per se do not signify an intrinsic sleep disorder or a cause for the patient's sleep-related symptoms. Causes include (but are not limited to) the first night effect of the sleep study, circadian rhythm disturbances, medication effect or an underlying mood disorder or medical problem.  3. The study showed occasional PVCs on single lead EKG; clinical correlation is recommended and consultation with cardiology may be feasible.  4. The patient should be cautioned not to drive, work at heights, or operate dangerous or heavy equipment when tired or sleepy. Review and reiteration of good sleep hygiene measures should be pursued with any patient. 5. The patient will be seen in follow-up by Dr. Frances Furbish at [facility] for discussion of the test results and further management strategies. The referring provider will be notified of the test results.  I certify that I have reviewed the entire raw data recording prior to the issuance of this report in accordance with the Standards of Accreditation of the American Academy of Sleep Medicine (AASM)  Huston Foley, MD, PhD Diplomat, American Board of Psychiatry and Neurology (Neurology and Sleep Medicine)

## 2016-12-10 NOTE — Progress Notes (Signed)
Patient referred by Dr. Lucia GaskinsAhern, seen by me on 11/07/16, diagnostic PSG on 12/05/16.     Please call and notify the patient that the recent sleep study showed overall borderline obstructive sleep apnea, with an AHI of 5.2/hour and O2 nadir of 87%. The REM AHI of 26.9/hour was in the moderate range. Of note, the absence of supine sleep may underestimate her AHI and O2 nadir. The use of positive airway pressure in the form of CPAP or autoPAP can be considered. Other treatment options may include avoidance of supine sleep position along with weight loss, upper airway or jaw surgery in selected patients or the use of an oral appliance in certain patients. ENT evaluation and/or consultation with a maxillofacial surgeon or dentist may be feasible in some instances. Given her medical history and her sleep related complaints, treatment with autoPAP is a reasonable choice. I can order the machine and initial settings, if she is willing to proceed, please let me know. Thanks,   Huston FoleySaima Sylvan Lahm, MD, PhD [facility]

## 2016-12-10 NOTE — Progress Notes (Signed)
Subjective:    Patient ID: Kristen [name], female    DOB: [birth year], 31 y.o.   MRN: [id number]  HPI Pt returns for f/u of hyperprolactinemia (dx'ed 2018, when she presented with galactorrhea; she also had pituitary microadenoma, PCO, and lymphocytic hypophysitis; ACTH test was normal).  Since on parlodel, galactorrhea has stopped.  Menses are less heavy now.   Past Medical History:  Diagnosis Date  . Anxiety 2013   treated at [facility] by Pauline Good   . Asthma 1989    no hospitalization, or intubation, previously on advair and singulair. asthma worsening.   . 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 Depression 2001   depressed since childhood   . 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 Kyphosis of thoracic region 2004    painful, runs in dad side   . Migraine   . Pancreatitis 2003   gallstone pancreatitis   . Pancreatitis due to biliary obstruction 2003  . PCOS (polycystic ovarian syndrome) 2014   facial hair, irregular periods, never been pregnant     Past Surgical History:  Procedure Laterality Date  . CHOLECYSTECTOMY  2003   . TONSILLECTOMY      Social History   Socioeconomic History  . Marital status: Single    Spouse name: Not on file  . Number of children: 0  . Years of education: college   . Highest education level: Not on file  Social Needs  . Financial resource strain: Not on file  . Food insecurity - worry: Not on file  . Food insecurity - inability: Not on file  . Transportation needs - medical: Not on file  . Transportation needs - non-medical: Not on file  Occupational History  . Occupation: Unemployed   Tobacco Use  . Smoking status: Never Smoker  . Smokeless tobacco: Never Used  Substance and Sexual Activity  . Alcohol use: No  . Drug use: No  . Sexual activity: Yes    Birth control/protection: None  Other Topics Concern  . Not on file  Social History Narrative   Lives with mom [name])    Right-handed   Caffeine: 3 cups of  coffee per day    Current Outpatient Medications on File Prior to Visit  Medication Sig Dispense Refill  . bromocriptine (PARLODEL) 2.5 MG tablet Take 0.5 tablets (1.25 mg total) by mouth at bedtime. 15 tablet 11  . hydrOXYzine (VISTARIL) 50 MG capsule Take 1 capsule (50 mg total) by mouth 3 (three) times daily as needed. 60 capsule 0  . metFORMIN (GLUCOPHAGE) 1000 MG tablet Take one tablet twice a day. 60 tablet 5  . Multiple Vitamin (MULTIVITAMIN) tablet Take 1 tablet by mouth daily.    . naproxen (NAPROSYN) 500 MG tablet TAKE 1 TABLET BY MOUTH 2 TIMES DAILY WITH A MEAL. 30 tablet 0  . sertraline (ZOLOFT) 100 MG tablet Take 100 mg by mouth daily.    Marland Kitchen topiramate (TOPAMAX) 25 MG tablet 33m for one week, 57mfor one week, 7591mor one week then 100m27m0 tablet 3   No current facility-administered medications on file prior to visit.     Allergies  Allergen Reactions  . Mucinex [Guaifenesin Er] Hives, Itching and Swelling  . Penicillins Swelling  . Contrast Media [Iodinated Diagnostic Agents] Nausea And Vomiting  . Multihance [Gadobenate] Nausea And Vomiting    Family History  Problem Relation Age of Onset  . Hypertension Mother   .  Arnold-Chiari malformation Mother   . Restless legs syndrome Mother   . Osteoporosis Mother   . COPD Mother   . Asthma Mother   . Squamous cell carcinoma Mother        skin   . Eczema Mother   . Arthritis Mother   . Peripheral Artery Disease Mother   . Hypertension Father   . Scoliosis Father   . Bipolar disorder Father   . Congestive Heart Failure Father   . Diabetes Maternal Grandmother   . Hypertension Maternal Grandmother   . Cancer Paternal Grandmother        colon  . Bone cancer Maternal Grandfather 82       bone marrow   . Multiple myeloma Maternal Grandfather   . Diabetes Paternal Grandfather     BP (!) 128/96 (BP Location: Left Arm, Patient Position: Sitting, Cuff Size: Large)   Pulse 94   Wt 294 lb 3.2 oz (133.4 kg)   LMP  11/18/2016   SpO2 96%   BMI 46.77 kg/m   Review of Systems Denies n/v/dizziness.       Objective:   Physical Exam VITAL SIGNS:  See vs page GENERAL: no distress Ext: no leg edema     Assessment & Plan:  Hyperprolactinemia, clinically better.  Due to no insurance, we'll hold off in rechecking labs now.  Obesity: persistent.  pituitary adenoma: due to no insurance, we'll hold off n rechecking MRI.    Patient Instructions  Please continue the same medications Please come back for a follow-up appointment in 1 year.  When you get insurance, you should consider the weight loss surgery.

## 2016-12-10 NOTE — Telephone Encounter (Signed)
I called Kristen [name]. I advised Kristen [name] that Dr. Frances FurbishAthar reviewed their sleep study results and found that Kristen [name] has mild to moderate sleep apnea. Dr. Frances FurbishAthar recommends that Kristen [name] starts an auto CPAP. I reviewed PAP compliance expectations with the Kristen [name]. Kristen [name] is agreeable to starting a CPAP. I advised Kristen [name] that an order will be sent to a DME, [facility], and [facility] will call the Kristen [name] within about one week after they file with the Kristen [name]'s insurance. [facility] will show the Kristen [name] how to use the machine, fit for masks, and troubleshoot the CPAP if needed. A follow up appt was made for insurance purposes with Dr. Frances FurbishAthar on Mar 05 2017 at 9:30 am . Kristen [name] verbalized understanding to arrive 15 minutes early and bring their CPAP. A letter with all of this information in it will be mailed to the Kristen [name] as a reminder. I verified with the Kristen [name] that the address we have on file is correct. Kristen [name] verbalized understanding of results. Kristen [name] had no questions at this time but was encouraged to call back if questions arise.

## 2016-12-11 ENCOUNTER — Encounter: Payer: Self-pay | Admitting: Critical Care Medicine

## 2016-12-11 ENCOUNTER — Ambulatory Visit: Payer: PRIVATE HEALTH INSURANCE | Attending: Critical Care Medicine | Admitting: Critical Care Medicine

## 2016-12-11 VITALS — BP 131/83 | HR 91 | Temp 98.0°F | Resp 16 | Wt 294.4 lb

## 2016-12-11 DIAGNOSIS — Z880 Allergy status to penicillin: Secondary | ICD-10-CM | POA: Insufficient documentation

## 2016-12-11 DIAGNOSIS — Z79899 Other long term (current) drug therapy: Secondary | ICD-10-CM | POA: Insufficient documentation

## 2016-12-11 DIAGNOSIS — E282 Polycystic ovarian syndrome: Secondary | ICD-10-CM | POA: Insufficient documentation

## 2016-12-11 DIAGNOSIS — F329 Major depressive disorder, single episode, unspecified: Secondary | ICD-10-CM | POA: Insufficient documentation

## 2016-12-11 DIAGNOSIS — F419 Anxiety disorder, unspecified: Secondary | ICD-10-CM | POA: Insufficient documentation

## 2016-12-11 DIAGNOSIS — J4550 Severe persistent asthma, uncomplicated: Secondary | ICD-10-CM | POA: Diagnosis not present

## 2016-12-11 DIAGNOSIS — Z7984 Long term (current) use of oral hypoglycemic drugs: Secondary | ICD-10-CM | POA: Insufficient documentation

## 2016-12-11 MED ORDER — ALBUTEROL SULFATE HFA 108 (90 BASE) MCG/ACT IN AERS: 2.0000 | INHALATION_SPRAY | Freq: Four times a day (QID) | RESPIRATORY_TRACT | 3 refills | Status: DC | PRN

## 2016-12-11 MED ORDER — BUDESONIDE-FORMOTEROL FUMARATE 160-4.5 MCG/ACT IN AERO: 2.0000 | INHALATION_SPRAY | Freq: Two times a day (BID) | RESPIRATORY_TRACT | 12 refills | Status: DC

## 2016-12-11 NOTE — Progress Notes (Signed)
Subjective:    Patient ID: Kristen [name], female    DOB: Mar 21, 1985, 31 y.o.   MRN: [id number]  31 y.o.F with asthma seen ED 11/3 with asthma flare, Rx and released.  Hx of asthma since age 36.  Pt worse as got older.  Pt well until 12/07/16 and came by EMS.  Rx nebs and steroids.  Now is better.  ?allergies or URI.    Note endocrine took pt off steroids and pt is better   Asthma  She complains of chest tightness, cough, difficulty breathing, frequent throat clearing, hoarse voice, shortness of breath and wheezing. There is no hemoptysis or sputum production. This is a recurrent problem. The current episode started more than 1 year ago. The problem occurs 2 to 4 times per day (symptoms worsened over a week prior to ED visit ). The problem has been gradually improving. The cough is non-productive, dry, nocturnal, paroxysmal, hoarse and supine. Associated symptoms include dyspnea on exertion, headaches, heartburn, malaise/fatigue, nasal congestion, orthopnea, PND, postnasal drip, rhinorrhea and sneezing. Pertinent negatives include no appetite change, chest pain, ear congestion, ear pain, fever, myalgias, sore throat or trouble swallowing. Her symptoms are aggravated by minimal activity, emotional stress, exercise, change in weather, pollen, URI and occupational exposure (chlorine ). Her symptoms are alleviated by beta-agonist and oral steroids. Her past medical history is significant for asthma and bronchitis. There is no history of pneumonia.   Past Medical History:  Diagnosis Date  . Anxiety 2013   treated at [facility] by Pauline Good   . Asthma 1989    no hospitalization, or intubation, previously on advair and singulair. asthma worsening.   . 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 Depression 2001   depressed since childhood   . 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 Kyphosis of thoracic region 2004    painful, runs in dad side   . Migraine   . Pancreatitis 2003   gallstone  pancreatitis   . Pancreatitis due to biliary obstruction 2003  . PCOS (polycystic ovarian syndrome) 2014   facial hair, irregular periods, never been pregnant      Family History  Problem Relation Age of Onset  . Hypertension Mother   . Arnold-Chiari malformation Mother   . Restless legs syndrome Mother   . Osteoporosis Mother   . COPD Mother   . Asthma Mother   . Squamous cell carcinoma Mother        skin   . Eczema Mother   . Arthritis Mother   . Peripheral Artery Disease Mother   . Hypertension Father   . Scoliosis Father   . Bipolar disorder Father   . Congestive Heart Failure Father   . Diabetes Maternal Grandmother   . Hypertension Maternal Grandmother   . Cancer Paternal Grandmother        colon  . Bone cancer Maternal Grandfather 82       bone marrow   . Multiple myeloma Maternal Grandfather   . Diabetes Paternal Grandfather      Social History   Socioeconomic History  . Marital status: Single    Spouse name: Not on file  . Number of children: 0  . Years of education: college   . Highest education level: Not on file  Social Needs  . Financial resource strain: Not on file  . Food insecurity - worry: Not on file  . Food insecurity - inability: Not on file  .  Transportation needs - medical: Not on file  . Transportation needs - non-medical: Not on file  Occupational History  . Occupation: Unemployed   Tobacco Use  . Smoking status: Never Smoker  . Smokeless tobacco: Never Used  Substance and Sexual Activity  . Alcohol use: No  . Drug use: No  . Sexual activity: Yes    Birth control/protection: None  Other Topics Concern  . Not on file  Social History Narrative   Lives with mom [name])    Right-handed   Caffeine: 3 cups of coffee per day     Allergies  Allergen Reactions  . Mucinex [Guaifenesin Er] Hives, Itching and Swelling  . Penicillins Swelling  . Contrast Media [Iodinated Diagnostic Agents] Nausea And Vomiting  . Multihance [Gadobenate]  Nausea And Vomiting     Outpatient Medications Prior to Visit  Medication Sig Dispense Refill  . bromocriptine (PARLODEL) 2.5 MG tablet Take 0.5 tablets (1.25 mg total) by mouth at bedtime. 15 tablet 11  . hydrOXYzine (VISTARIL) 50 MG capsule Take 1 capsule (50 mg total) by mouth 3 (three) times daily as needed. 60 capsule 0  . metFORMIN (GLUCOPHAGE) 1000 MG tablet Take one tablet twice a day. 60 tablet 5  . Multiple Vitamin (MULTIVITAMIN) tablet Take 1 tablet by mouth daily.    . naproxen (NAPROSYN) 500 MG tablet TAKE 1 TABLET BY MOUTH 2 TIMES DAILY WITH A MEAL. 30 tablet 0  . topiramate (TOPAMAX) 25 MG tablet 68m for one week, 523mfor one week, 75110mor one week then 100m53m0 tablet 3  . albuterol (PROVENTIL HFA;VENTOLIN HFA) 108 (90 BASE) MCG/ACT inhaler Inhale 2 puffs into the lungs every 6 (six) hours as needed for wheezing or shortness of breath. 6.7 g 3  . sertraline (ZOLOFT) 100 MG tablet Take 100 mg by mouth daily.     No facility-administered medications prior to visit.       Review of Systems  Constitutional: Positive for malaise/fatigue. Negative for appetite change and fever.  HENT: Positive for hoarse voice, postnasal drip, rhinorrhea and sneezing. Negative for ear pain, sore throat and trouble swallowing.   Respiratory: Positive for cough, shortness of breath and wheezing. Negative for hemoptysis and sputum production.   Cardiovascular: Positive for dyspnea on exertion and PND. Negative for chest pain.  Gastrointestinal: Positive for heartburn.  Musculoskeletal: Negative for myalgias.  Neurological: Positive for headaches.       Objective:   Physical Exam  Vitals:   12/11/16 0912  BP: 131/83  Pulse: 91  Resp: 16  Temp: 98 F (36.7 C)  TempSrc: Oral  SpO2: 99%  Weight: 294 lb 6.4 oz (133.5 kg)    Gen: Pleasant, well-nourished, in no distress,  normal affect  ENT: No lesions,  mouth clear,  oropharynx clear, no postnasal drip  Neck: No JVD, no TMG, no  carotid bruits  Lungs: No use of accessory muscles, no dullness to percussion, expired wheezes  Cardiovascular: RRR, heart sounds normal, no murmur or gallops, no peripheral edema  Abdomen: soft and NT, no HSM,  BS normal  Musculoskeletal: No deformities, no cyanosis or clubbing  Neuro: alert, non focal  Skin: Warm, no lesions or rashes  No results found.  No recent CXR or pfts     Assessment & Plan:  I personally reviewed all images and lab [facility] as well as any outside material available during this office visit and agree with the  radiology impressions.   Asthma, severe persistent  Severe persistent asthma with recent exacerbation Plan Start symbicort two puff bid 137mg Prn albuterol Obtain PFTs [facility]   SCorrinewas seen today for hospitalization follow-up.  Diagnoses and all orders for this visit:  Severe persistent asthma without complication -     Pulmonary Function Test; Future -     Ambulatory referral to Pulmonology  Other orders -     budesonide-formoterol (SYMBICORT) 160-4.5 MCG/ACT inhaler; Inhale 2 puffs 2 (two) times daily into the lungs. -     albuterol (PROVENTIL HFA;VENTOLIN HFA) 108 (90 Base) MCG/ACT inhaler; Inhale 2 puffs every 6 (six) hours as needed into the lungs for wheezing or shortness of breath.

## 2016-12-11 NOTE — Patient Instructions (Signed)
Start Symbicort two puff twice daily Use albuterol as needed A referral to [facility] will be made for asthma care A lung function test will be obtained at [facility]

## 2016-12-11 NOTE — Assessment & Plan Note (Signed)
Severe persistent asthma with recent exacerbation Plan Start symbicort two puff bid Prn albuterol Obtain PFTs Referral to [facility] for long term pulmonary f/u

## 2016-12-17 ENCOUNTER — Telehealth: Payer: Self-pay | Admitting: Internal Medicine

## 2016-12-17 NOTE — Telephone Encounter (Signed)
Patient's mother called asking for a letter saying she is not able [place] to work right now and she is still under doctors care.

## 2016-12-17 NOTE — Telephone Encounter (Signed)
Kristen BosworthCarlos could you schedule

## 2016-12-18 ENCOUNTER — Telehealth: Payer: Self-pay | Admitting: Internal Medicine

## 2016-12-23 ENCOUNTER — Ambulatory Visit: Payer: Self-pay | Attending: Internal Medicine | Admitting: Internal Medicine

## 2016-12-23 ENCOUNTER — Encounter: Payer: Self-pay | Admitting: Internal Medicine

## 2016-12-23 VITALS — BP 107/74 | HR 77 | Temp 97.6°F | Resp 18 | Ht 66.5 in | Wt 299.4 lb

## 2016-12-23 DIAGNOSIS — E221 Hyperprolactinemia: Secondary | ICD-10-CM | POA: Insufficient documentation

## 2016-12-23 DIAGNOSIS — F4010 Social phobia, unspecified: Secondary | ICD-10-CM | POA: Insufficient documentation

## 2016-12-23 DIAGNOSIS — E236 Other disorders of pituitary gland: Secondary | ICD-10-CM | POA: Insufficient documentation

## 2016-12-23 DIAGNOSIS — J4550 Severe persistent asthma, uncomplicated: Secondary | ICD-10-CM

## 2016-12-23 DIAGNOSIS — E282 Polycystic ovarian syndrome: Secondary | ICD-10-CM

## 2016-12-23 DIAGNOSIS — Z880 Allergy status to penicillin: Secondary | ICD-10-CM | POA: Insufficient documentation

## 2016-12-23 DIAGNOSIS — G4733 Obstructive sleep apnea (adult) (pediatric): Secondary | ICD-10-CM

## 2016-12-23 DIAGNOSIS — L304 Erythema intertrigo: Secondary | ICD-10-CM | POA: Insufficient documentation

## 2016-12-23 DIAGNOSIS — G43909 Migraine, unspecified, not intractable, without status migrainosus: Secondary | ICD-10-CM | POA: Insufficient documentation

## 2016-12-23 DIAGNOSIS — Z79899 Other long term (current) drug therapy: Secondary | ICD-10-CM | POA: Insufficient documentation

## 2016-12-23 DIAGNOSIS — G2581 Restless legs syndrome: Secondary | ICD-10-CM | POA: Insufficient documentation

## 2016-12-23 MED ORDER — ALBUTEROL SULFATE (2.5 MG/3ML) 0.083% IN NEBU: 2.5000 mg | INHALATION_SOLUTION | Freq: Four times a day (QID) | RESPIRATORY_TRACT | 5 refills | Status: DC | PRN

## 2016-12-23 MED FILL — ALBUTEROL 0.083% INHAL SOLN: (2.5 MG/3ML | 7 days supply | Qty: 90 | Fill #0

## 2016-12-23 MED FILL — ?METFORMIN HCL 1,000 MG TAB: 1000 | 30 days supply | Qty: 60 | Fill #0

## 2016-12-23 MED FILL — SYMBICORT 160-4.5 MCG INH: 160-4.5 | 25 days supply | Qty: 10 | Fill #0

## 2016-12-23 NOTE — Patient Instructions (Addendum)
Cancel December 6th appointment with me. Please reschedule for 3 months follow up.  Please get recommended CPAP settings and recommended mass from your neurologist for obstructive sleep apnea.  Once we have that information we would be able to help in getting a new CPAP machine.    Get Symbicort filled today and use as instructed. We have given you a nebulizer machine with a liquid treatments to use as needed for asthma flares.

## 2016-12-23 NOTE — Progress Notes (Signed)
Patient ID: Kristen [name], female    DOB: 04-27-1985  MRN: [id number]  CC: Follow-up   Subjective: Kristen [name] is a 31 y.o. female who presents for UC visit. Kristen is with her Her concerns today include:  Pt with hx of PCOS, morbid obesity, tob. Dep, RLS, social phobia, lymphocytic hypophysitis, hyperprolactinemia, severe persistent asthma, migraines.   1.  Patient here requesting a letter for DSS to allow her to get food stamps. she also is requesting form to be completed for handicap sticker.   -She was given a letter on last visit because due to multiple physicians appointment she felt she was unable to work consistently. -pt pass exam for CMA.  She is currently looking for a job.  Still having several physician appointments over the next 6 weeks including dental work that she is having done.  2. Mild OSA -had sleep study through her neurologist.  This revealed very mild sleep apnea.  However given her symptoms neurologist felt it is worth trying her on CPAP.  She was given paperwork to fill out to get help with obtaining a CPAP machine.  She has not turned it in as yet.   3. Asthma: Saw Dr. Joya Gaskins since her last visit with me.  She was having recurrent flares of her asthma. Worse with season changes and air quality especially from summer to fall -referred to [facility]. They tried contacting her but was unable to reach her to schedule.  -uses Albuterol MDI about Q 3 days.  Did have to use several times yesterday due to mild flare. Did not get Symbicort as yet. She plans to pick up today.   4. PCOS: Metformin dose increased to 1 gram BID. Cycles more regular, bleeding is not as heavy and bleeding less. Menses now last 5-7 days from 7-12 days.   Patient Active Problem List   Diagnosis Date Noted  . 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 Elevated prolactin level ([place]) 07/05/2016  . Vulvar lesion 07/05/2016  . Restless legs 05/24/2016  . Seasonal allergies  05/13/2014  . Intertrigo 05/13/2014  . Decreased vision 05/13/2014  . PCOS (polycystic ovarian syndrome) 02/07/2014  . Social phobia 02/02/2007  . ADD 02/02/2007  . 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 Asthma, severe persistent 02/02/2007  . SCOLIOSIS 02/02/2007  . OTHER SPECIFIED CONGENITAL ANOMALIES OF KIDNEY 02/02/2007  . ELECTROCARDIOGRAM, ABNORMAL 02/02/2007     Current Outpatient Medications on File Prior to Visit  Medication Sig Dispense Refill  . albuterol (PROVENTIL HFA;VENTOLIN HFA) 108 (90 Base) MCG/ACT inhaler Inhale 2 puffs every 6 (six) hours as needed into the lungs for wheezing or shortness of breath. 6.7 g 3  . bromocriptine (PARLODEL) 2.5 MG tablet Take 0.5 tablets (1.25 mg total) by mouth at bedtime. 15 tablet 11  . budesonide-formoterol (SYMBICORT) 160-4.5 MCG/ACT inhaler Inhale 2 puffs 2 (two) times daily into the lungs. 1 Inhaler 12  . hydrOXYzine (VISTARIL) 50 MG capsule Take 1 capsule (50 mg total) by mouth 3 (three) times daily as needed. 60 capsule 0  . metFORMIN (GLUCOPHAGE) 1000 MG tablet Take one tablet twice a day. 60 tablet 5  . Multiple Vitamin (MULTIVITAMIN) tablet Take 1 tablet by mouth daily.    . naproxen (NAPROSYN) 500 MG tablet TAKE 1 TABLET BY MOUTH 2 TIMES DAILY WITH A MEAL. 30 tablet 0  . sertraline (ZOLOFT) 100 MG tablet Take 100 mg by mouth daily.    Marland Kitchen topiramate (TOPAMAX) 25 MG tablet 41m for one week, 55mfor  one week, 34m for one week then 1074m(Patient not taking: Reported on 12/25/2016) 120 tablet 3   No current facility-administered medications on file prior to visit.     Allergies  Allergen Reactions  . Mucinex [Guaifenesin Er] Hives, Itching and Swelling  . Penicillins Swelling  . Contrast Media [Iodinated Diagnostic Agents] Nausea And Vomiting  . Multihance [Gadobenate] Nausea And Vomiting    Social History   Socioeconomic History  . Marital status: Single    Spouse name: Not on file  . Number of children: 0  . Years of education:  college   . Highest education level: Not on file  Social Needs  . Financial resource strain: Not on file  . Food insecurity - worry: Not on file  . Food insecurity - inability: Not on file  . Transportation needs - medical: Not on file  . Transportation needs - non-medical: Not on file  Occupational History  . Occupation: Unemployed   Tobacco Use  . Smoking status: Never Smoker  . Smokeless tobacco: Never Used  Substance and Sexual Activity  . Alcohol use: No  . Drug use: No  . Sexual activity: Yes    Birth control/protection: None  Other Topics Concern  . Not on file  Social History Narrative   Lives with Kristen [name]   Right-handed   Caffeine: 3 cups of coffee per day    Family History  Problem Relation Age of Onset  . Hypertension Mother   . Arnold-Chiari malformation Mother   . Restless legs syndrome Mother   . Osteoporosis Mother   . COPD Mother   . Asthma Mother   . Squamous cell carcinoma Mother        skin   . Eczema Mother   . Arthritis Mother   . Peripheral Artery Disease Mother   . Hypertension Father   . Scoliosis Father   . Bipolar disorder Father   . Congestive Heart Failure Father   . Diabetes Maternal Grandmother   . Hypertension Maternal Grandmother   . Cancer Paternal Grandmother        colon  . Bone cancer Maternal Grandfather 82       bone marrow   . Multiple myeloma Maternal Grandfather   . Diabetes Paternal Grandfather     Past Surgical History:  Procedure Laterality Date  . CHOLECYSTECTOMY  2003   . OPEN REDUCTION INTERNAL FIXATION (ORIF) PROXIMAL PHALANX Right 07/23/2013   Procedure: OPEN TREATMENT RIGHT RING FINGER, PROXIMAL INTERPHALANGEAL/JOINT FRACTURE DISLOCATION;  Surgeon: DaJolyn NapMD;  [facility] Service: Orthopedics;  Laterality: Right;  . TONSILLECTOMY      ROS: Review of Systems Negative except as stated above  PHYSICAL EXAM: BP 107/74 (BP Location: Right Arm, Patient Position:  Sitting, Cuff Size: Large)   Pulse 77   Temp 97.6 F (36.4 C) (Oral)   Resp 18   Ht 5' 6.5" (1.689 m)   Wt 299 lb 6.4 oz (135.8 kg)   SpO2 97%   BMI 47.60 kg/m   Wt Readings from Last 3 Encounters:  12/25/16 298 lb 3.2 oz (135.3 kg)  12/23/16 299 lb 6.4 oz (135.8 kg)  12/11/16 294 lb 6.4 oz (133.5 kg)   Physical Exam General appearance - alert, well appearing, and in no distress Mental status - alert, oriented to person, place, and time, normal mood, behavior, speech, dress, motor activity, and thought processes Mouth - mucous membranes moist, pharynx normal without lesions Neck - supple,  no significant adenopathy Chest - clear to auscultation, no wheezes, rales or rhonchi, symmetric air entry Heart - normal rate, regular rhythm, normal S1, S2, no murmurs, rubs, clicks or gallops  ASSESSMENT AND PLAN: 1. Severe persistent asthma without complication -pt to get Symbicort fill -given a neb machine - albuterol (PROVENTIL) (2.5 MG/3ML) 0.083% nebulizer solution; Take 3 mLs (2.5 mg total) every 6 (six) hours as needed by nebulization for wheezing or shortness of breath.  Dispense: 50 vial; Refill: 5 -form completed for handicap sticker  2. PCOS (polycystic ovarian syndrome) Followed by GYN. On Metformin  3. OSA (obstructive sleep apnea) -advise pt to get recommended settings for CPAP and type of mask then we can help her get CPAP  Letter given for DSS informing them that she is able to work but has several physician appts in the next 6 wks. Patient was given the opportunity to ask questions.  Patient verbalized understanding of the plan and was able to repeat key elements of the plan.   No orders of the defined types were placed in this encounter.    Requested Prescriptions   Signed Prescriptions Disp Refills  . albuterol (PROVENTIL) (2.5 MG/3ML) 0.083% nebulizer solution 50 vial 5    Sig: Take 3 mLs (2.5 mg total) every 6 (six) hours as needed by nebulization for wheezing  or shortness of breath.    Return in about 3 months (around 03/25/2017).  Karle Plumber, MD, FACP

## 2016-12-25 ENCOUNTER — Ambulatory Visit: Payer: Self-pay | Admitting: Obstetrics & Gynecology

## 2016-12-25 ENCOUNTER — Ambulatory Visit (HOSPITAL_BASED_OUTPATIENT_CLINIC_OR_DEPARTMENT_OTHER): Payer: Self-pay | Admitting: Family Medicine

## 2016-12-25 ENCOUNTER — Ambulatory Visit: Payer: Self-pay | Attending: Internal Medicine

## 2016-12-25 ENCOUNTER — Encounter: Payer: Self-pay | Admitting: Family Medicine

## 2016-12-25 VITALS — BP 134/96 | HR 76 | Temp 97.5°F | Ht 66.0 in | Wt 298.2 lb

## 2016-12-25 DIAGNOSIS — K5909 Other constipation: Secondary | ICD-10-CM

## 2016-12-25 DIAGNOSIS — F329 Major depressive disorder, single episode, unspecified: Secondary | ICD-10-CM | POA: Insufficient documentation

## 2016-12-25 DIAGNOSIS — Z79899 Other long term (current) drug therapy: Secondary | ICD-10-CM | POA: Insufficient documentation

## 2016-12-25 DIAGNOSIS — J45909 Unspecified asthma, uncomplicated: Secondary | ICD-10-CM | POA: Insufficient documentation

## 2016-12-25 DIAGNOSIS — Z8619 Personal history of other infectious and parasitic diseases: Secondary | ICD-10-CM

## 2016-12-25 DIAGNOSIS — F419 Anxiety disorder, unspecified: Secondary | ICD-10-CM | POA: Insufficient documentation

## 2016-12-25 DIAGNOSIS — E282 Polycystic ovarian syndrome: Secondary | ICD-10-CM | POA: Insufficient documentation

## 2016-12-25 DIAGNOSIS — Z880 Allergy status to penicillin: Secondary | ICD-10-CM | POA: Insufficient documentation

## 2016-12-25 NOTE — Progress Notes (Signed)
Subjective:  Patient ID: Kristen [name], female    DOB: 03/31/1985  Age: 31 y.o. MRN: [id number]  CC: Constipation  HPI Kristen LaserStephanie Y Myron is a 31 year old female with a history of PCOS, asthma who presents today with complaints of 'passing a worm in her stool'. She does have a history of tapeworm infection in the past and noticed a brownish discoloration in the toilet bowl when she moved her bowel a few days ago. She complains that her neighbors usually kill live animals at home and this could be contributing to her 'infections'  She denies abdominal pain, nausea, vomiting, fever, loss of appetite but does complain of constipation for the last 3 days.  Prior to now she did have intermittent diarrhea as a result of her IBS.  Past Medical History:  Diagnosis Date  . Anxiety 2013   [facility] Deatra RobinsonKaren Jones   . Asthma 1989    no hospitalization, or intubation, previously on advair and singulair. asthma worsening.   . 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 Depression 2001   depressed since childhood   . 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 Kyphosis of thoracic region 2004    painful, runs in dad side   . Migraine   . Pancreatitis 2003   gallstone pancreatitis   . Pancreatitis due to biliary obstruction 2003  . PCOS (polycystic ovarian syndrome) 2014   facial hair, irregular periods, never been pregnant     Past Surgical History:  Procedure Laterality Date  . CHOLECYSTECTOMY  2003   . OPEN REDUCTION INTERNAL FIXATION (ORIF) PROXIMAL PHALANX Right 07/23/2013   Procedure: OPEN TREATMENT RIGHT RING FINGER, PROXIMAL INTERPHALANGEAL/JOINT FRACTURE DISLOCATION;  Surgeon: Jodi Marbleavid A Thompson, MD;  Location: [facility];  Service: Orthopedics;  Laterality: Right;  . TONSILLECTOMY      Allergies  Allergen Reactions  . Mucinex [Guaifenesin Er] Hives, Itching and Swelling  . Penicillins Swelling  . Contrast Media [Iodinated Diagnostic Agents] Nausea  And Vomiting  . Multihance [Gadobenate] Nausea And Vomiting     Outpatient Medications Prior to Visit  Medication Sig Dispense Refill  . albuterol (PROVENTIL HFA;VENTOLIN HFA) 108 (90 Base) MCG/ACT inhaler Inhale 2 puffs every 6 (six) hours as needed into the lungs for wheezing or shortness of breath. 6.7 g 3  . albuterol (PROVENTIL) (2.5 MG/3ML) 0.083% nebulizer solution Take 3 mLs (2.5 mg total) every 6 (six) hours as needed by nebulization for wheezing or shortness of breath. 50 vial 5  . bromocriptine (PARLODEL) 2.5 MG tablet Take 0.5 tablets (1.25 mg total) by mouth at bedtime. 15 tablet 11  . budesonide-formoterol (SYMBICORT) 160-4.5 MCG/ACT inhaler Inhale 2 puffs 2 (two) times daily into the lungs. 1 Inhaler 12  . hydrOXYzine (VISTARIL) 50 MG capsule Take 1 capsule (50 mg total) by mouth 3 (three) times daily as needed. 60 capsule 0  . metFORMIN (GLUCOPHAGE) 1000 MG tablet Take one tablet twice a day. 60 tablet 5  . Multiple Vitamin (MULTIVITAMIN) tablet Take 1 tablet by mouth daily.    . naproxen (NAPROSYN) 500 MG tablet TAKE 1 TABLET BY MOUTH 2 TIMES DAILY WITH A MEAL. 30 tablet 0  . sertraline (ZOLOFT) 100 MG tablet Take 100 mg by mouth daily.    Marland Kitchen. topiramate (TOPAMAX) 25 MG tablet 25mg  for one week, 50mg  for one week, 75mg  for one week then 100mg  (Patient not taking: Reported on 12/25/2016) 120 tablet 3  No facility-administered medications prior to visit.     ROS Review of Systems  Constitutional: Negative for activity change, appetite change and fatigue.  HENT: Negative for congestion, sinus pressure and sore throat.   Eyes: Negative for visual disturbance.  Respiratory: Negative for cough, chest tightness, shortness of breath and wheezing.   Cardiovascular: Negative for chest pain and palpitations.  Gastrointestinal: Negative for abdominal distention, abdominal pain and constipation.  Endocrine: Negative for polydipsia.  Genitourinary: Negative for dysuria and frequency.    Musculoskeletal: Negative for arthralgias and back pain.  Skin: Negative for rash.  Neurological: Negative for tremors, light-headedness and numbness.  Hematological: Does not bruise/bleed easily.  Psychiatric/Behavioral: Negative for agitation and behavioral problems.    Objective:  BP (!) 134/96   Pulse 76   Temp (!) 97.5 F (36.4 C) (Oral)   Ht 5\' 6"  (1.676 m)   Wt 298 lb 3.2 oz (135.3 kg)   SpO2 98%   BMI 48.13 kg/m   BP/Weight 12/25/2016 12/23/2016 12/11/2016  Systolic BP 134 107 131  Diastolic BP 96 74 83  Wt. (Lbs) 298.2 299.4 294.4  BMI 48.13 47.6 46.81      Physical Exam  Constitutional: She is oriented to person, place, and time. She appears well-developed and well-nourished.  Cardiovascular: Normal rate, normal heart sounds and intact distal pulses.  No murmur heard. Pulmonary/Chest: Effort normal and breath sounds normal. She has no wheezes. She has no rales. She exhibits no tenderness.  Abdominal: Soft. Bowel sounds are normal. She exhibits no distension and no mass. There is no tenderness.  Musculoskeletal: Normal range of motion.  Neurological: She is alert and oriented to person, place, and time.  Skin: Skin is warm and dry.  Psychiatric: She has a normal mood and affect.     Assessment & Plan:   1. Other constipation Advised to increase fiber intake We will hold off on using laxatives as she does have a history of IBS with predominant diarrhea - Ova and Parasite Examination; Future  2. History of tapeworm infection She is unable to provide a stool specimen today Provided with a specimen cup to bring in specimen later - Ova and Parasite Examination; Future   No orders of the defined types were placed in this encounter.   Follow-up: Return for Follow-up of chronic medical conditions; keep upcoming appointment with PCP.   Jaclyn ShaggyEnobong Amao MD

## 2016-12-25 NOTE — Progress Notes (Signed)
Pt states that she thinks she passed a worm in her stool.

## 2016-12-31 NOTE — Addendum Note (Signed)
Addended by: Paschal DoppWHITE, Kadrian Partch J on: 12/31/2016 02:52 PM   Modules accepted: Orders

## 2017-01-06 LAB — OVA AND PARASITE EXAMINATION

## 2017-01-08 ENCOUNTER — Ambulatory Visit (INDEPENDENT_AMBULATORY_CARE_PROVIDER_SITE_OTHER): Payer: Self-pay | Admitting: Pulmonary Disease

## 2017-01-08 ENCOUNTER — Encounter: Payer: Self-pay | Admitting: Pulmonary Disease

## 2017-01-08 ENCOUNTER — Other Ambulatory Visit (INDEPENDENT_AMBULATORY_CARE_PROVIDER_SITE_OTHER): Payer: Self-pay

## 2017-01-08 VITALS — BP 132/78 | HR 91 | Ht 66.5 in | Wt 295.0 lb

## 2017-01-08 DIAGNOSIS — J45909 Unspecified asthma, uncomplicated: Secondary | ICD-10-CM

## 2017-01-08 DIAGNOSIS — R0602 Shortness of breath: Secondary | ICD-10-CM

## 2017-01-08 LAB — CBC WITH DIFFERENTIAL/PLATELET
BASOS PCT: 0.9 % (ref 0.0–3.0)
Basophils Absolute: 0.1 10*3/uL (ref 0.0–0.1)
Eosinophils Absolute: 0.7 10*3/uL (ref 0.0–0.7)
Eosinophils Relative: 7.2 % — ABNORMAL HIGH (ref 0.0–5.0)
HEMATOCRIT: 45.5 % (ref 36.0–46.0)
Hemoglobin: 15.1 g/dL — ABNORMAL HIGH (ref 12.0–15.0)
LYMPHS PCT: 35.4 % (ref 12.0–46.0)
Lymphs Abs: 3.6 10*3/uL (ref 0.7–4.0)
MCHC: 33.2 g/dL (ref 30.0–36.0)
MCV: 87.1 fl (ref 78.0–100.0)
MONOS PCT: 5 % (ref 3.0–12.0)
Monocytes Absolute: 0.5 10*3/uL (ref 0.1–1.0)
NEUTROS ABS: 5.3 10*3/uL (ref 1.4–7.7)
Neutrophils Relative %: 51.5 % (ref 43.0–77.0)
PLATELETS: 234 10*3/uL (ref 150.0–400.0)
RBC: 5.22 Mil/uL — ABNORMAL HIGH (ref 3.87–5.11)
RDW: 13.9 % (ref 11.5–15.5)
WBC: 10.2 10*3/uL (ref 4.0–10.5)

## 2017-01-08 LAB — NITRIC OXIDE: Nitric Oxide: 47

## 2017-01-08 MED ORDER — MONTELUKAST SODIUM 10 MG PO TABS: 10.0000 mg | ORAL_TABLET | Freq: Every day | ORAL | 2 refills | Status: DC

## 2017-01-08 NOTE — Patient Instructions (Addendum)
We will schedule you for pulmonary function test, CT of the chest without contrast Continue using the Symbicort and albuterol We will start you on Singulair for your allergies Check CBC with differential and blood allergy profile  Follow-up in 1 month.

## 2017-01-08 NOTE — Progress Notes (Signed)
Kristen [name]    [id number]    05/20/1985  Primary Care Physician:Johnson, Dalbert Batman, MD  Referring Physician: Elsie Stain, MD [address], [place] [address]  Chief complaint: Consult for evaluation of asthma  HPI: 31 year old with history of childhood asthma.  She was diagnosed as a child and had been maintained on Advair and albuterol.  She got better during her adult years. However over the past few months she has noticed worsening dyspnea requiring daily albuterol use.  She has nocturnal awakenings 2-3 times per week.  She had to go to the emergency room early October for asthma exacerbation and was treated with nebs and IV Solu-Medrol.  She subsequently saw her primary care and was started on Symbicort.  She has improvement in her symptoms but still needs to use her albuterol 2-3 times per week.  She follows up with [facility] neurology for headache and vision changes.  MRI of the head noted pituitary stalk thickening with differential diagnosis including neuro sarcoid. She has a recent diagnosis of sleep apnea and is awaiting initiation of CPAP. She has no pets at home no exposure to mold.  She reports exposure to dust Carpets at home which are cleaned regularly and forced air hearting/cooling with HEPA filters  Pets: None Occupation:[employer]ployed. Looking for a job Exposures: No mold, significant dust exposure Smoking history: Never smoker Travel History: Not significant  Outpatient Encounter Medications as of 01/08/2017  Medication Sig  . albuterol (PROVENTIL HFA;VENTOLIN HFA) 108 (90 Base) MCG/ACT inhaler Inhale 2 puffs every 6 (six) hours as needed into the lungs for wheezing or shortness of breath.  Marland Kitchen albuterol (PROVENTIL) (2.5 MG/3ML) 0.083% nebulizer solution Take 3 mLs (2.5 mg total) every 6 (six) hours as needed by nebulization for wheezing or shortness of breath.  . bromocriptine (PARLODEL) 2.5 MG tablet Take 0.5 tablets (1.25 mg total) by mouth  at bedtime.  . budesonide-formoterol (SYMBICORT) 160-4.5 MCG/ACT inhaler Inhale 2 puffs 2 (two) times daily into the lungs.  . hydrOXYzine (VISTARIL) 50 MG capsule Take 1 capsule (50 mg total) by mouth 3 (three) times daily as needed.  . metFORMIN (GLUCOPHAGE) 1000 MG tablet Take one tablet twice a day.  . Multiple Vitamin (MULTIVITAMIN) tablet Take 1 tablet by mouth daily.  . naproxen (NAPROSYN) 500 MG tablet TAKE 1 TABLET BY MOUTH 2 TIMES DAILY WITH A MEAL.  Marland Kitchen sertraline (ZOLOFT) 100 MG tablet Take 100 mg by mouth daily.  Marland Kitchen topiramate (TOPAMAX) 25 MG tablet 30m for one week, 553mfor one week, 7556mor one week then 100m40mNo facility-administered encounter medications on file as of 01/08/2017.     Allergies as of 01/08/2017 - Review Complete 12/25/2016  Allergen Reaction Noted  . Mucinex [guaifenesin er] Hives, Itching, and Swelling 08/27/2011  . Penicillins Swelling 08/23/2016  . Contrast media [iodinated diagnostic agents] Nausea And Vomiting 08/23/2016  . Multihance [gadobenate] Nausea And Vomiting 07/14/2016    Past Medical History:  Diagnosis Date  . Anxiety 2013   [facility]    no hospitalization, or intubation, previously on advair and singulair. asthma worsening.   . 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 Depression 2001   depressed since childhood   . 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 Kyphosis of thoracic region 2004    painful, runs in dad side   .  Migraine   . Pancreatitis 2003   gallstone pancreatitis   . Pancreatitis due to biliary obstruction 2003  . PCOS (polycystic ovarian syndrome) 2014   facial hair, irregular periods, never been pregnant     Past Surgical History:  Procedure Laterality Date  . CHOLECYSTECTOMY  2003   . OPEN REDUCTION INTERNAL FIXATION (ORIF) PROXIMAL PHALANX Right 07/23/2013   Procedure: OPEN TREATMENT RIGHT RING FINGER, PROXIMAL INTERPHALANGEAL/JOINT FRACTURE DISLOCATION;   Surgeon: Jolyn Nap, MD;  Location: [place];  Service: Orthopedics;  Laterality: Right;  . TONSILLECTOMY      Family History  Problem Relation Age of Onset  . Hypertension Mother   . Arnold-Chiari malformation Mother   . Restless legs syndrome Mother   . Osteoporosis Mother   . COPD Mother   . Asthma Mother   . Squamous cell carcinoma Mother        skin   . Eczema Mother   . Arthritis Mother   . Peripheral Artery Disease Mother   . Hypertension Father   . Scoliosis Father   . Bipolar disorder Father   . Congestive Heart Failure Father   . Diabetes Maternal Grandmother   . Hypertension Maternal Grandmother   . Cancer Paternal Grandmother        colon  . Bone cancer Maternal Grandfather 82       bone marrow   . Multiple myeloma Maternal Grandfather   . Diabetes Paternal Grandfather     Social History   Socioeconomic History  . Marital status: Single    Spouse name: Not on file  . Number of children: 0  . Years of education: college   . Highest education level: Not on file  Social Needs  . Financial resource strain: Not on file  . Food insecurity - worry: Not on file  . Food insecurity - inability: Not on file  . Transportation needs - medical: Not on file  . Transportation needs - non-medical: Not on file  Occupational History  . Occupation: Unemployed   Tobacco Use  . Smoking status: Never Smoker  . Smokeless tobacco: Never Used  Substance and Sexual Activity  . Alcohol use: No  . Drug use: No  . Sexual activity: Yes    Birth control/protection: None  Other Topics Concern  . Not on file  Social History Narrative   Lives with mom Kristen [name])    Right-handed   Caffeine: 3 cups of coffee per day    Review of systems: Review of Systems  Constitutional: Negative for fever and chills.  HENT: Negative.   Eyes: Negative for blurred vision.  Respiratory: as per HPI  Cardiovascular: Negative for chest pain and palpitations.    Gastrointestinal: Negative for vomiting, diarrhea, blood per rectum. Genitourinary: Negative for dysuria, urgency, frequency and hematuria.  Musculoskeletal: Negative for myalgias, back pain and joint pain.  Skin: Negative for itching and rash.  Neurological: Negative for dizziness, tremors, focal weakness, seizures and loss of consciousness.  Endo/Heme/Allergies: Negative for environmental allergies.  Psychiatric/Behavioral: Negative for depression, suicidal ideas and hallucinations.  All other systems reviewed and are negative.  Physical Exam: Blood pressure 132/78, pulse 91, height 5' 6.5" (1.689 m), weight 295 lb (133.8 kg), SpO2 95 %. Gen:      No acute distress, obese HEENT:  EOMI, sclera anicteric Neck:     No masses; no thyromegaly Lungs:    Clear to auscultation bilaterally; normal respiratory effort CV:         Regular  rate and rhythm; no murmurs Abd:      + bowel sounds; soft, non-tender; no palpable masses, no distension Ext:    No edema; adequate peripheral perfusion Skin:      Warm and dry; no rash Neuro: alert and oriented x 3 Psych: normal mood and affect  Data Reviewed: Chest x-ray 08/27/11-atelectasis, low lung volumes I have reviewed the images personally  FENO 01/08/17-47  MRI brain 11/01/16-  1.     Brain parenchyma appears normal before and after contrast. 2.     The pituitary stalk is thickened.    This could be incidental, but could also occur with inflammatory disorder such as lymphocytic infundibulo-hypophysitis, Langerhans cell histiocytosis, neurosarcoidosis another inflammatory etiologies.   The changes are stable when compared to the MRI dated 07/14/2016 making a neoplastic disorder less likely. 3.    Chronic maxillary and ethmoid sinusitis.  Assessment:  Severe persistent asthma with exacerbation She has worsening control over the past few months.  FENO is elevated in office today. She is started Symbicort 2 weeks ago and will continue the same.   Given her symptoms of seasonal allergies and postnasal drip we will start on Singulair. Check PFTs, CBC differential and blood allergy profile.  MRI brain noted above with pituitary stalk thickening.  Neurosarcoid is on the differential and she follows with [facility] neurology.  We will get a CT scan of the chest to see if she has any pulmonary involvement. Obtain records from [facility]  Plan/Recommendations: - Continue Symbicort, albuterol - Start Singulair - PFTs, CBC differential, blood allergy profile - CT chest without contrast. - Obtain records from [facility] neurology.  Marshell Garfinkel MD [facility] Pager [phone] 01/08/2017, 9:01 AM  CC: Kristen Stain, MD

## 2017-01-09 ENCOUNTER — Encounter: Payer: Self-pay | Admitting: Internal Medicine

## 2017-01-09 ENCOUNTER — Ambulatory Visit: Payer: No Typology Code available for payment source | Attending: Internal Medicine | Admitting: Internal Medicine

## 2017-01-09 VITALS — BP 125/88 | HR 86 | Temp 98.0°F | Ht 66.0 in | Wt 296.4 lb

## 2017-01-09 DIAGNOSIS — E282 Polycystic ovarian syndrome: Secondary | ICD-10-CM

## 2017-01-09 DIAGNOSIS — J4550 Severe persistent asthma, uncomplicated: Secondary | ICD-10-CM

## 2017-01-09 DIAGNOSIS — G4733 Obstructive sleep apnea (adult) (pediatric): Secondary | ICD-10-CM

## 2017-01-09 DIAGNOSIS — Z6841 Body Mass Index (BMI) 40.0 and over, adult: Secondary | ICD-10-CM

## 2017-01-09 LAB — RESPIRATORY ALLERGY PROFILE REGION II ~~LOC~~
Allergen, Cedar tree, t12: <0.1 kU/L
Allergen, Cottonwood, t14: <0.1 kU/L
Allergen, D pternoyssinus,d7: <0.1 kU/L
Allergen, Mouse Urine Protein, e78: 0.4 kU/L — ABNORMAL HIGH
Allergen, Mulberry, t76: <0.1 kU/L
Allergen, Oak,t7: <0.1 kU/L
Aspergillus fumigatus, m3: <0.1 kU/L
Bermuda Grass: <0.1 kU/L
CAT DANDER: 25.7 kU/L — AB
CLASS: 0
CLASS: 0
CLASS: 0
CLASS: 0
CLASS: 0
CLASS: 0
CLASS: 0
CLASS: 0
CLASS: 0
CLASS: 0
CLASS: 1
CLASS: 3
CLASS: 4
Class: 0
Class: 0
Class: 0
Class: 0
Class: 0
Class: 0
Class: 0
Class: 0
Class: 0
Class: 0
Class: 0
Cockroach: <0.1 kU/L
DOG DANDER: 4.12 kU/L — AB
IGE (IMMUNOGLOBULIN E), SERUM: 143 kU/L — AB (ref <=114)
Rough Pigweed  IgE: <0.1 kU/L
Timothy Grass: <0.1 kU/L

## 2017-01-09 LAB — INTERPRETATION:

## 2017-01-09 NOTE — Progress Notes (Signed)
Patient ID: Kristen [name], female    DOB: 04-02-1985  MRN: [id number]  CC: Follow-up   Subjective: Kristen [name] is a 31 y.o. female who presents for chronic ds management. Her concerns today include:  Pt with hx of PCOS, morbid obesity, tob. Dep, RLS, social phobia, lymphocytic hypophysitis, hyperprolactinemia, severe persistent asthma, migraines.  1.  No further sighting of worm in her stools.  Stools for O&P were negative  2. Asthma: saw pulmonary in f/u yesterday.  Started on Singulair.  Continue Symbicort.  CT of the chest ordered I think to check for any signs of sarcoid given that it was in the differential for changes seen on MRI of the head  3. OSA: she requested records from neurologist.  I can see the neurologist note.  Patient told that what we need is the recommended CPAP settings type of mask so that we can help her get the machine  4. PCOS: menses regular for the past 2 mths on Metformin. Also bleeding less and lasting 5-7 days.  5.  Obesity: "Not eating as much as I use to. I'm walking a lot more and I'm swimming once a wk." -her goal is to get below 200  Patient Active Problem List   Diagnosis Date Noted  . 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 Elevated prolactin level ([place]) 07/05/2016  . Vulvar lesion 07/05/2016  . Restless legs 05/24/2016  . Seasonal allergies 05/13/2014  . Intertrigo 05/13/2014  . Decreased vision 05/13/2014  . PCOS (polycystic ovarian syndrome) 02/07/2014  . Social phobia 02/02/2007  . ADD 02/02/2007  . 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 Asthma, severe persistent 02/02/2007  . SCOLIOSIS 02/02/2007  . OTHER SPECIFIED CONGENITAL ANOMALIES OF KIDNEY 02/02/2007  . ELECTROCARDIOGRAM, ABNORMAL 02/02/2007     Current Outpatient Medications on File Prior to Visit  Medication Sig Dispense Refill  . albuterol (PROVENTIL HFA;VENTOLIN HFA) 108 (90 Base) MCG/ACT inhaler Inhale 2 puffs every 6 (six) hours as needed into the lungs for wheezing or  shortness of breath. 6.7 g 3  . albuterol (PROVENTIL) (2.5 MG/3ML) 0.083% nebulizer solution Take 3 mLs (2.5 mg total) every 6 (six) hours as needed by nebulization for wheezing or shortness of breath. 50 vial 5  . bromocriptine (PARLODEL) 2.5 MG tablet Take 0.5 tablets (1.25 mg total) by mouth at bedtime. 15 tablet 11  . budesonide-formoterol (SYMBICORT) 160-4.5 MCG/ACT inhaler Inhale 2 puffs 2 (two) times daily into the lungs. 1 Inhaler 12  . hydrOXYzine (VISTARIL) 50 MG capsule Take 1 capsule (50 mg total) by mouth 3 (three) times daily as needed. 60 capsule 0  . metFORMIN (GLUCOPHAGE) 1000 MG tablet Take one tablet twice a day. 60 tablet 5  . montelukast (SINGULAIR) 10 MG tablet Take 1 tablet (10 mg total) by mouth daily. 30 tablet 2  . Multiple Vitamin (MULTIVITAMIN) tablet Take 1 tablet by mouth daily.    . naproxen (NAPROSYN) 500 MG tablet TAKE 1 TABLET BY MOUTH 2 TIMES DAILY WITH A MEAL. 30 tablet 0  . sertraline (ZOLOFT) 100 MG tablet Take 100 mg by mouth daily.    Marland Kitchen topiramate (TOPAMAX) 25 MG tablet 61m for one week, 518mfor one week, 7569mor one week then 100m63matient not taking: Reported on 01/09/2017) 120 tablet 3   No current facility-administered medications on file prior to visit.     Allergies  Allergen Reactions  . Mucinex [Guaifenesin Er] Hives, Itching and Swelling  . Penicillins Swelling  . Contrast Media [Iodinated Diagnostic Agents]  Nausea And Vomiting  . Multihance [Gadobenate] Nausea And Vomiting    Social History   Socioeconomic History  . Marital status: Single    Spouse name: Not on file  . Number of children: 0  . Years of education: college   . Highest education level: Not on file  Social Needs  . Financial resource strain: Not on file  . Food insecurity - worry: Not on file  . Food insecurity - inability: Not on file  . Transportation needs - medical: Not on file  . Transportation needs - non-medical: Not on file  Occupational History  .  Occupation: Unemployed   Tobacco Use  . Smoking status: Never Smoker  . Smokeless tobacco: Never Used  Substance and Sexual Activity  . Alcohol use: No  . Drug use: No  . Sexual activity: Yes    Birth control/protection: None  Other Topics Concern  . Not on file  Social History Narrative   Lives with mom [name])    Right-handed   Caffeine: 3 cups of coffee per day    Family History  Problem Relation Age of Onset  . Hypertension Mother   . Arnold-Chiari malformation Mother   . Restless legs syndrome Mother   . Osteoporosis Mother   . COPD Mother   . Asthma Mother   . Squamous cell carcinoma Mother        skin   . Eczema Mother   . Arthritis Mother   . Peripheral Artery Disease Mother   . Hypertension Father   . Scoliosis Father   . Bipolar disorder Father   . Congestive Heart Failure Father   . Diabetes Maternal Grandmother   . Hypertension Maternal Grandmother   . Cancer Paternal Grandmother        colon  . Bone cancer Maternal Grandfather 82       bone marrow   . Multiple myeloma Maternal Grandfather   . Diabetes Paternal Grandfather     Past Surgical History:  Procedure Laterality Date  . CHOLECYSTECTOMY  2003   . OPEN REDUCTION INTERNAL FIXATION (ORIF) PROXIMAL PHALANX Right 07/23/2013   Procedure: OPEN TREATMENT RIGHT RING FINGER, PROXIMAL INTERPHALANGEAL/JOINT FRACTURE DISLOCATION;  Surgeon: Jolyn Nap, MD;  Location: [place];  Service: Orthopedics;  Laterality: Right;  . TONSILLECTOMY      ROS: Review of Systems neg except as stated above PHYSICAL EXAM: BP 125/88 (BP Location: Left Wrist, Patient Position: Sitting, Cuff Size: Normal)   Pulse 86   Temp 98 F (36.7 C) (Oral)   Ht '5\' 6"'$  (1.676 m)   Wt 296 lb 6.4 oz (134.4 kg)   SpO2 97%   BMI 47.84 kg/m   Wt Readings from Last 3 Encounters:  01/09/17 296 lb 6.4 oz (134.4 kg)  01/08/17 295 lb (133.8 kg)  12/25/16 298 lb 3.2 oz (135.3 kg)    Physical Exam General  appearance - alert, well appearing, obese female and in no distress Mental status - alert, oriented to person, place, and time, normal mood, behavior, speech, dress, motor activity, and thought processes Mouth - mucous membranes moist, pharynx normal without lesions Neck - supple, no significant adenopathy Chest - clear to auscultation, no wheezes, rales or rhonchi, symmetric air entry Heart - normal rate, regular rhythm, normal S1, S2, no murmurs, rubs, clicks or gallops Ext: no LE edema   ASSESSMENT AND PLAN: 1. Severe persistent asthma without complication -Recently saw pulmonary.  Continue albuterol, Singulair and Symbicort. Avoid triggers 2. OSA (obstructive  sleep apnea) -Patient to find out from her neurologist the recommended setting for CPAP and type mask  3. PCOS (polycystic ovarian syndrome) Continue metformin.  4. Class 3 severe obesity due to excess calories without serious comorbidity with body mass index (BMI) of 45.0 to 49.9 in adult [facility]) Encourage exercise at least 3-4 times a week for 30 minutes.  Can use her rescue inhaler 15 minutes before exercise if needed. Discussed healthy eating habits with her and was given printed information  Patient was given the opportunity to ask questions.  Patient verbalized understanding of the plan and was able to repeat key elements of the plan.   No orders of the defined types were placed in this encounter.    Requested Prescriptions    No prescriptions requested or ordered in this encounter    F/u in 3 mths Karle Plumber, MD, Rosalita Chessman

## 2017-01-09 NOTE — Patient Instructions (Signed)
To help achieve your weigh goal, you need to exercise at least 3-4 times a week for 30 minutes. Use your Albuterol inhaler prior to exercise.  Follow a Healthy Eating Plan - You can do it! Limit sugary drinks.  Avoid sodas, sweet tea, sport or energy drinks, or fruit drinks.  Drink water, lo-fat milk, or diet drinks. Limit snack foods.   Cut back on candy, cake, cookies, chips, ice cream.  These are a special treat, only in small amounts. Eat plenty of vegetables.  Especially dark green, red, and orange vegetables. Aim for at least 3 servings a day. More is better! Include fruit in your daily diet.  Whole fruit is much healthier than fruit juice! Limit "white" bread, "white" pasta, "white" rice.   Choose "100% whole grain" products, brown or wild rice. Avoid fatty meats. Try "Meatless Monday" and choose eggs or beans one day a week.  When eating meat, choose lean meats like chicken, [place]key, and fish.  Grill, broil, or bake meats instead of frying, and eat poultry without the skin. Eat less salt.  Avoid frozen pizzas, frozen dinners and salty foods.  Use seasonings other than salt in cooking.  This can help blood pressure and keep you from swelling Beer, wine and liquor have calories.  If you can safely drink alcohol, limit to 1 drink per day for women, 2 drinks for men

## 2017-01-09 NOTE — Progress Notes (Signed)
Patient is here for a follow up.

## 2017-01-17 ENCOUNTER — Inpatient Hospital Stay: Admission: RE | Admit: 2017-01-17 | Payer: Self-pay | Source: Ambulatory Visit

## 2017-01-21 ENCOUNTER — Ambulatory Visit: Payer: Self-pay | Admitting: Adult Health

## 2017-01-21 ENCOUNTER — Encounter: Payer: Self-pay | Admitting: Adult Health

## 2017-01-21 VITALS — BP 130/90 | HR 81 | Ht 66.0 in | Wt 298.0 lb

## 2017-01-21 DIAGNOSIS — G4733 Obstructive sleep apnea (adult) (pediatric): Secondary | ICD-10-CM

## 2017-01-21 DIAGNOSIS — G43009 Migraine without aura, not intractable, without status migrainosus: Secondary | ICD-10-CM

## 2017-01-21 NOTE — Progress Notes (Signed)
PATIENT: Kristen [name] DOB: [birth year]  REASON FOR VISIT: follow up HISTORY FROM: patient  HISTORY OF PRESENT ILLNESS: Today 01/21/17 [name] is a 31 year old female with a history of migraine headaches and obstructive sleep apnea.  She returns today for an evaluation.  She reports that she stopped Topamax because she is interested in getting pregnant.  She reports that her headache frequency has actually decreased.  Reports that she was having approximately 3 headaches a week and now she is only having one headache a week.  She states that when she does get a headache it typically occurs in the center of the forehead.  She does report photophobia but no phonophobia.  She denies nausea and vomiting.  She denies any significant changes with her vision.  Reports on occasion she will have some blurred or double vision however she states that this is usually brief.  She states that she typically can take naproxen and lay down and her headache will resolve in approximately 2 hours.  The patient was diagnosed with mild to moderate sleep apnea.  CPAP therapy was suggested.  However the patient has not yet started using the CPAP.  She returns today for an evaluation.  HISTORY Kristen [name] is a 31 y.o. 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 She has PCOS and hyperprolactinemia. MRI of the brain was performed due to elevated prolactin. She ha headaches daily, migraines 10 days a month.Daily headaches are more dull an din the front of the forehead and sometimes pressure all around the head, head feels full, when she sometimes hears her heart beat, she feels there is something in her ears. The migraines feel like intense pressure across the forehead, sometimes throbbing, light and sound sensitivity, nausea. Feels like she has to vomit. Mother has migraines. Can be painful, can last all day up to 3 days. Didn't feel better after draining fluid. She sometimes wakes with  headaches, she snores a lot heavily, mother has noticed she stops breathing in the middle of the night. She takes naproxen but not more than 10x a month, ice packs and quiet help. She has had a recent TB neg. she has blurry vision, no frank double vision although her vision does get blurry.No other focal neurologic deficits, associated symptoms, inciting events or modifiable factors.    Reviewed notes, labs and imaging from outside physicians, which showed:  Personally reviewed MRI of the brain images 07/14/2016 and agree with the following: 1. No focal pituitary lesion. 2. Thickening and enhancement of the pituitary stalk. 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 Pituitary adenoma can extending to the pituitary stalk. Granulomatous disease including neurosarcoidosis can have a similar appearance. No other basilar enhancement is evident to suggest granulomatous disease. Follow-up imaging after treatment could be performed for further evaluation.  REVIEW OF SYSTEMS: Out of a complete 14 system review of symptoms, the patient complains only of the following symptoms, and all other reviewed systems are negative.  See HPI  ALLERGIES: Allergies  Allergen Reactions  . Mucinex [Guaifenesin Er] Hives, Itching and Swelling  . Penicillins Swelling  . Contrast Media [Iodinated Diagnostic Agents] Nausea And Vomiting  . Multihance [Gadobenate] Nausea And Vomiting    HOME MEDICATIONS: Outpatient Medications Prior to Visit  Medication Sig Dispense Refill  . albuterol (PROVENTIL HFA;VENTOLIN HFA) 108 (90 Base) MCG/ACT inhaler Inhale 2 puffs every 6 (six) hours as needed into the lungs for wheezing or shortness of breath. 6.7 g 3  .  albuterol (PROVENTIL) (2.5 MG/3ML) 0.083% nebulizer solution Take 3 mLs (2.5 mg total) every 6 (six) hours as needed by nebulization for wheezing or shortness of breath. 50 vial 5  . bromocriptine (PARLODEL) 2.5 MG tablet Take 0.5 tablets (1.25 mg total)  by mouth at bedtime. 15 tablet 11  . budesonide-formoterol (SYMBICORT) 160-4.5 MCG/ACT inhaler Inhale 2 puffs 2 (two) times daily into the lungs. 1 Inhaler 12  . hydrOXYzine (VISTARIL) 50 MG capsule Take 1 capsule (50 mg total) by mouth 3 (three) times daily as needed. 60 capsule 0  . metFORMIN (GLUCOPHAGE) 1000 MG tablet Take one tablet twice a day. 60 tablet 5  . montelukast (SINGULAIR) 10 MG tablet Take 1 tablet (10 mg total) by mouth daily. 30 tablet 2  . Multiple Vitamin (MULTIVITAMIN) tablet Take 1 tablet by mouth daily.    . naproxen (NAPROSYN) 500 MG tablet TAKE 1 TABLET BY MOUTH 2 TIMES DAILY WITH A MEAL. 30 tablet 0  . sertraline (ZOLOFT) 100 MG tablet Take 100 mg by mouth daily.    Marland Kitchen topiramate (TOPAMAX) 25 MG tablet 75m for one week, 524mfor one week, 7536mor one week then 100m50m0 tablet 3   No facility-administered medications prior to visit.     PAST MEDICAL HISTORY: Past Medical History:  Diagnosis Date  . Anxiety 2013   [facility]    no hospitalization, or intubation, previously on advair and singulair. asthma worsening.   . 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 Depression 2001   depressed since childhood   . 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 Kyphosis of thoracic region 2004    painful, runs in dad side   . Migraine   . Pancreatitis 2003   gallstone pancreatitis   . Pancreatitis due to biliary obstruction 2003  . PCOS (polycystic ovarian syndrome) 2014   facial hair, irregular periods, never been pregnant     PAST SURGICAL HISTORY: Past Surgical History:  Procedure Laterality Date  . CHOLECYSTECTOMY  2003   . OPEN REDUCTION INTERNAL FIXATION (ORIF) PROXIMAL PHALANX Right 07/23/2013   Procedure: OPEN TREATMENT RIGHT RING FINGER, PROXIMAL INTERPHALANGEAL/JOINT FRACTURE DISLOCATION;  Surgeon: DaviJolyn Nap;  [facility];  Laterality: Right;  .  TONSILLECTOMY      FAMILY HISTORY: Family History  Problem Relation Age of Onset  . Hypertension Mother   . Arnold-Chiari malformation Mother   . Restless legs syndrome Mother   . Osteoporosis Mother   . COPD Mother   . Asthma Mother   . Squamous cell carcinoma Mother        skin   . Eczema Mother   . Arthritis Mother   . Peripheral Artery Disease Mother   . Hypertension Father   . Scoliosis Father   . Bipolar disorder Father   . Congestive Heart Failure Father   . Diabetes Maternal Grandmother   . Hypertension Maternal Grandmother   . Cancer Paternal Grandmother        colon  . Bone cancer Maternal Grandfather 82       bone marrow   . Multiple myeloma Maternal Grandfather   . Diabetes Paternal Grandfather     SOCIAL HISTORY: Social History   Socioeconomic History  . Marital status: Single    Spouse name: Not on file  . Number of children: 0  . Years of education:  college   . Highest education level: Not on file  Social Needs  . Financial resource strain: Not on file  . Food insecurity - worry: Not on file  . Food insecurity - inability: Not on file  . Transportation needs - medical: Not on file  . Transportation needs - non-medical: Not on file  Occupational History  . Occupation: Unemployed   Tobacco Use  . Smoking status: Never Smoker  . Smokeless tobacco: Never Used  Substance and Sexual Activity  . Alcohol use: No  . Drug use: No  . Sexual activity: Yes    Birth control/protection: None  Other Topics Concern  . Not on file  Social History Narrative   Lives with mom [name])    Right-handed   Caffeine: 3 cups of coffee per day      PHYSICAL EXAM  Vitals:   01/21/17 0723  BP: 130/90  Pulse: 81  Weight: 298 lb (135.2 kg)  Height: 5' 6"  (1.676 m)   Body mass index is 48.1 kg/m.  Generalized: Well developed, in no acute distress   Neurological examination  Mentation: Alert oriented to time, place, history taking. Follows all commands  speech and language fluent Cranial nerve II-XII: Pupils were equal round reactive to light. Extraocular movements were full, visual field were full on confrontational test. Facial sensation and strength were normal. Uvula tongue midline. Head turning and shoulder shrug  were normal and symmetric. Motor: The motor testing reveals 5 over 5 strength of all 4 extremities. Good symmetric motor tone is noted throughout.  Sensory: Sensory testing is intact to soft touch on all 4 extremities. No evidence of extinction is noted.  Coordination: Cerebellar testing reveals good finger-nose-finger and heel-to-shin bilaterally.  Gait and station: Gait is normal. Tandem gait is normal. Romberg is negative. No drift is seen.  Reflexes: Deep tendon reflexes are symmetric and normal bilaterally.   DIAGNOSTIC DATA (LABS, IMAGING, TESTING) - I reviewed patient records, labs, notes, testing and imaging myself where available.  Lab Results  Component Value Date   WBC 10.2 01/08/2017   HGB 15.1 (H) 01/08/2017   HCT 45.5 01/08/2017   MCV 87.1 01/08/2017   PLT 234.0 01/08/2017      Component Value Date/Time   NA 144 11/25/2016 0958   K 4.0 11/25/2016 0958   CL 106 11/25/2016 0958   CO2 23 11/25/2016 0958   GLUCOSE 84 11/25/2016 0958   GLUCOSE 100 (H) 09/18/2016 1418   BUN 9 11/25/2016 0958   CREATININE 0.96 11/25/2016 0958   CREATININE 1.09 03/25/2014 1730   CALCIUM 9.6 11/25/2016 0958   PROT 6.8 11/25/2016 0958   ALBUMIN 4.4 11/25/2016 0958   AST 20 11/25/2016 0958   ALT 31 11/25/2016 0958   ALKPHOS 130 (H) 11/25/2016 0958   BILITOT 0.5 11/25/2016 0958   GFRNONAA 80 11/25/2016 0958   GFRNONAA 70 03/25/2014 1730   GFRAA 92 11/25/2016 0958   GFRAA 80 03/25/2014 1730   No results found for: CHOL, HDL, LDLCALC, LDLDIRECT, TRIG, CHOLHDL Lab Results  Component Value Date   HGBA1C 5.0 04/09/2016   No results found for: VITAMINB12 Lab Results  Component Value Date   TSH 1.37 09/18/2016       ASSESSMENT AND PLAN 31 y.o. year old female  has a past medical history of Anxiety (2013), Asthma (1989 ), Congenital third kidney (1989 ), Depression (2001), Dysrhythmia (2010), Kyphosis of thoracic region (2004 ), Migraine, Pancreatitis (2003), Pancreatitis due to biliary obstruction (2003), and PCOS (polycystic  ovarian syndrome) (2014). here with:  1.  Migraine headaches 2.  Obstructive sleep apnea  The patient is no longer on Topamax.  Her headache frequency has improved.  The patient is considering pregnancy within the next couple months.  For that reason we will not initiate any new medication.  She is encouraged to start using CPAP therapy as this also may be an issue for her headache.  She voiced understanding.  She will follow-up in 6 months or sooner if needed.     Ward Givens, MSN, NP-C 01/21/2017, 1:44 PM [facility] [address] [place], [place] [address] [phone]

## 2017-01-21 NOTE — Progress Notes (Signed)
Personally  participated in, made any corrections needed, and agree with history, physical, neuro exam,assessment and plan as stated above.    Antonia Ahern, MD [facility] 

## 2017-01-21 NOTE — Patient Instructions (Signed)
Your Plan:  Continue to monitor headaches Send us the opthalmology notes once you have appointment Start CPAP and keep follow up with Dr. Frances FurbishAthar  [facility]. I hope we have been able to provide you high quality care today.  You may receive a patient satisfaction survey over the next few weeks. We would appreciate your feedback and comments so that we may continue to improve ourselves and the health of our patients.

## 2017-01-22 ENCOUNTER — Encounter: Payer: Self-pay | Admitting: Obstetrics & Gynecology

## 2017-01-22 ENCOUNTER — Ambulatory Visit (INDEPENDENT_AMBULATORY_CARE_PROVIDER_SITE_OTHER): Payer: Self-pay | Admitting: Obstetrics & Gynecology

## 2017-01-22 VITALS — BP 128/73 | HR 69 | Ht 66.0 in | Wt 295.0 lb

## 2017-01-22 DIAGNOSIS — E282 Polycystic ovarian syndrome: Secondary | ICD-10-CM

## 2017-01-22 MED ORDER — LETROZOLE 2.5 MG PO TABS: 2.5000 mg | ORAL_TABLET | Freq: Every day | ORAL | 1 refills | Status: DC

## 2017-01-22 NOTE — Patient Instructions (Signed)
Return to clinic for any scheduled appointments or for any gynecologic concerns as needed.   

## 2017-01-22 NOTE — Progress Notes (Signed)
GYNECOLOGY OFFICE VISIT NOTE  History:  31 y.o. G0P0 here today for PCOS management. She is on Metformin 1000 mg twice a day, no side effects.  Periods are regular, she is losing weight. Using ovulation predictor kits. No longer on Topamax.  Interested in starting Femara.  She denies any abnormal vaginal discharge, bleeding, pelvic pain or other concerns.   Past Medical History:  Diagnosis Date  . Anxiety 2013   [facility]   . Asthma 1989    no hospitalization, or intubation, previously on advair and singulair. asthma worsening.   . 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 Depression 2001   depressed since childhood   . 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 Kyphosis of thoracic region 2004    painful, runs in dad side   . Migraine   . Pancreatitis 2003   gallstone pancreatitis   . Pancreatitis due to biliary obstruction 2003  . PCOS (polycystic ovarian syndrome) 2014   facial hair, irregular periods, never been pregnant     Past Surgical History:  Procedure Laterality Date  . CHOLECYSTECTOMY  2003   . OPEN REDUCTION INTERNAL FIXATION (ORIF) PROXIMAL PHALANX Right 07/23/2013   Procedure: OPEN TREATMENT RIGHT RING FINGER, PROXIMAL INTERPHALANGEAL/JOINT FRACTURE DISLOCATION;  Surgeon: Jodi Marbleavid A Thompson, MD;  Location: [facility];  Service: Orthopedics;  Laterality: Right;  . TONSILLECTOMY      The following portions of the patient's history were reviewed and updated as appropriate: allergies, current medications, past family history, past medical history, past social history, past surgical history and problem list.   Health Maintenance:  Normal pap on 03/25/2014.  Review of Systems:  Pertinent items noted in HPI and remainder of comprehensive ROS otherwise negative.  Objective:  Physical Exam BP 128/73   Pulse 69   Ht 5\' 6"  (1.676 m)   Wt 295 lb (133.8 kg)   LMP 01/17/2017 (Exact Date)   BMI 47.61 kg/m  CONSTITUTIONAL:  Well-developed, well-nourished female in no acute distress.  HENT:  Normocephalic, atraumatic. External right and left ear normal. Oropharynx is clear and moist EYES: Conjunctivae and EOM are normal. Pupils are equal, round, and reactive to light. No scleral icterus.  NECK: Normal range of motion, supple, no masses SKIN: Skin is warm and dry. No rash noted. Not diaphoretic. No erythema. No pallor. NEUROLOGIC: Alert and oriented to person, place, and time. Normal reflexes, muscle tone coordination. No cranial nerve deficit noted. PSYCHIATRIC: Normal mood and affect. Normal behavior. Normal judgment and thought content. CARDIOVASCULAR: Normal heart rate noted RESPIRATORY: Effort and breath sounds normal, no problems with respiration noted ABDOMEN: Soft, no distention noted.   PELVIC: Deferred MUSCULOSKELETAL: Normal range of motion. No edema noted.  Labs and Imaging No results found.  Assessment & Plan:  1. PCOS (polycystic ovarian syndrome) When prescribing letrozole, the starting dose is 2.5 mg/day cycle days 3 to 7 following a spontaneous menses or progestin-induced bleed. If the cycle is ovulatory but pregnancy has not occurred, the same dose should be used in the next cycle. If ovulation does not occur, the dose should be increased to 5 mg/day cycle days 3 to 7, with a maximal dose of 7.5 mg/day. - letrozole (FEMARA) 2.5 MG tablet; Take 1 tablet (2.5 mg total) by mouth daily. Take on days 3 to 7 following menstrual period  Dispense: 5 tablet; Refill: 1 Advised the following:  *Continue weight loss efforts *Continue  the use of ovulation predictor kits *Talk to partner about semen analysis   Return in about 2 months (around 03/25/2017) for Followup PCOS.    Total face-to-face time with patient: 15 minutes.  Over 50% of encounter was spent on counseling and coordination of care.   Jaynie CollinsUGONNA  Shyla Gayheart, MD, FACOG Attending Obstetrician & Gynecologist, [facility], [facility]

## 2017-01-23 ENCOUNTER — Encounter (INDEPENDENT_AMBULATORY_CARE_PROVIDER_SITE_OTHER): Payer: Self-pay

## 2017-01-23 ENCOUNTER — Ambulatory Visit (INDEPENDENT_AMBULATORY_CARE_PROVIDER_SITE_OTHER)
Admission: RE | Admit: 2017-01-23 | Discharge: 2017-01-23 | Disposition: A | Discharge status: Home / Self Care | Payer: No Typology Code available for payment source | Source: Ambulatory Visit | Attending: Pulmonary Disease | Admitting: Pulmonary Disease

## 2017-01-23 DIAGNOSIS — R0602 Shortness of breath: Secondary | ICD-10-CM

## 2017-01-24 ENCOUNTER — Telehealth: Payer: Self-pay | Admitting: Pulmonary Disease

## 2017-01-24 NOTE — Telephone Encounter (Signed)
Notes recorded by Chilton GreathouseMannam, Praveen, MD on 01/24/2017 at 2:41 PM EST Please let the pt know that CT scan looks normal. There is no evidence of sarcoidosis affecting the lung  Advised pt of results. Pt understood and nothing further is needed.

## 2017-02-06 MED FILL — ?METFORMIN HCL 1,000 MG TAB: 1000 | 30 days supply | Qty: 60 | Fill #1

## 2017-02-06 MED FILL — LETROZOLE 2.5 MG TABLET: 2.5 | 5 days supply | Qty: 5 | Fill #0

## 2017-02-19 ENCOUNTER — Ambulatory Visit (INDEPENDENT_AMBULATORY_CARE_PROVIDER_SITE_OTHER): Payer: No Typology Code available for payment source | Admitting: Pulmonary Disease

## 2017-02-19 ENCOUNTER — Encounter: Payer: Self-pay | Admitting: Pulmonary Disease

## 2017-02-19 VITALS — BP 124/80 | HR 89 | Ht 65.0 in | Wt 295.2 lb

## 2017-02-19 DIAGNOSIS — J45909 Unspecified asthma, uncomplicated: Secondary | ICD-10-CM

## 2017-02-19 LAB — PULMONARY FUNCTION TEST
DL/VA % PRED: 106 %
DL/VA: 5.35 ml/min/mmHg/L
DLCO COR % PRED: 121 %
DLCO COR: 32.25 ml/min/mmHg
DLCO UNC: 33.2 ml/min/mmHg
DLCO unc % pred: 124 %
FEF 25-75 POST: 4.97 L/s
FEF 25-75 PRE: 2.96 L/s
FEF2575-%CHANGE-POST: 68 %
FEF2575-%PRED-POST: 139 %
FEF2575-%PRED-PRE: 83 %
FEV1-%Change-Post: 15 %
FEV1-%PRED-POST: 123 %
FEV1-%Pred-Pre: 107 %
FEV1-Post: 4.16 L
FEV1-Pre: 3.61 L
FEV1FVC-%Change-Post: 8 %
FEV1FVC-%PRED-PRE: 90 %
FEV6-%CHANGE-POST: 5 %
FEV6-%PRED-POST: 126 %
FEV6-%Pred-Pre: 119 %
FEV6-Post: 5 L
FEV6-Pre: 4.72 L
FEV6FVC-%Pred-Post: 101 %
FEV6FVC-%Pred-Pre: 101 %
FVC-%Change-Post: 5 %
FVC-%Pred-Post: 125 %
FVC-%Pred-Pre: 118 %
FVC-Post: 5 L
FVC-Pre: 4.72 L
POST FEV1/FVC RATIO: 83 %
PRE FEV1/FVC RATIO: 76 %
PRE FEV6/FVC RATIO: 100 %
Post FEV6/FVC ratio: 100 %
RV % pred: 130 %
RV: 1.93 L
TLC % pred: 125 %
TLC: 6.68 L

## 2017-02-19 NOTE — Patient Instructions (Signed)
I am glad that your breathing is better Continue the Symbicort, albuterol and Singulair Follow-up in 6 months.

## 2017-02-19 NOTE — Progress Notes (Signed)
Kristen [name]    [id number]    08-05-1985  Primary Care Physician:Johnson, Dalbert Batman, MD  Referring Physician: Ladell Pier, MD [address] [place], [place] [address]  Chief complaint: Follow-up for Severe persistent asthma.  HPI: 32 year old with history of childhood asthma.  She was diagnosed as a child and had been maintained on Advair and albuterol.  She got better during her adult years. However over the past few months she has noticed worsening dyspnea requiring daily albuterol use.  She has nocturnal awakenings 2-3 times per week.  She had to go to the emergency room early October for asthma exacerbation and was treated with nebs and IV Solu-Medrol.  She subsequently saw her primary care and was started on Symbicort.  She has improvement in her symptoms but still needs to use her albuterol 2-3 times per week.  She follows up with [facility] neurology for headache and vision changes.  MRI of the head noted pituitary stalk thickening with differential diagnosis including neuro sarcoid. She has a recent diagnosis of sleep apnea and is awaiting initiation of CPAP. She has no pets at home no exposure to mold.  She reports exposure to dust Carpets at home which are cleaned regularly and forced air hearting/cooling with HEPA filters  Pets: None Occupation:U[employer]ployed. Looking for a job Exposures: No mold, significant dust exposure Smoking history: Never smoker Travel History: Not significant  Interim history: Breathing is improved.  She continues on Singulair, Symbicort and albuterol.  She hardly needs to use her rescue inhaler, no nighttime awakenings  CPAP has been ordered by her neurologist and she is awaiting treatment She is started on bromocriptine for elevated prolactin levels  Outpatient Encounter Medications as of 02/19/2017  Medication Sig  . albuterol (PROVENTIL HFA;VENTOLIN HFA) 108 (90 Base) MCG/ACT inhaler Inhale 2 puffs every 6 (six) hours as needed  into the lungs for wheezing or shortness of breath.  Marland Kitchen albuterol (PROVENTIL) (2.5 MG/3ML) 0.083% nebulizer solution Take 3 mLs (2.5 mg total) every 6 (six) hours as needed by nebulization for wheezing or shortness of breath.  . bromocriptine (PARLODEL) 2.5 MG tablet Take 0.5 tablets (1.25 mg total) by mouth at bedtime.  . budesonide-formoterol (SYMBICORT) 160-4.5 MCG/ACT inhaler Inhale 2 puffs 2 (two) times daily into the lungs.  . hydrOXYzine (VISTARIL) 50 MG capsule Take 1 capsule (50 mg total) by mouth 3 (three) times daily as needed.  Marland Kitchen letrozole (FEMARA) 2.5 MG tablet Take 1 tablet (2.5 mg total) by mouth daily. Take on days 3 to 7 following menstrual period  . metFORMIN (GLUCOPHAGE) 1000 MG tablet Take one tablet twice a day.  . montelukast (SINGULAIR) 10 MG tablet Take 1 tablet (10 mg total) by mouth daily.  . Multiple Vitamin (MULTIVITAMIN) tablet Take 1 tablet by mouth daily.  . naproxen (NAPROSYN) 500 MG tablet TAKE 1 TABLET BY MOUTH 2 TIMES DAILY WITH A MEAL.  . [DISCONTINUED] sertraline (ZOLOFT) 100 MG tablet Take 100 mg by mouth daily.   No facility-administered encounter medications on file as of 02/19/2017.     Allergies as of 02/19/2017 - Review Complete 02/19/2017  Allergen Reaction Noted  . Mucinex [guaifenesin er] Hives, Itching, and Swelling 08/27/2011  . Penicillins Swelling 08/23/2016  . Contrast media [iodinated diagnostic agents] Nausea And Vomiting 08/23/2016  . Multihance [gadobenate] Nausea And Vomiting 07/14/2016    Past Medical History:  Diagnosis Date  . Anxiety 2013   treated at [facility] by Pauline Good   .  Asthma 1989    no hospitalization, or intubation, previously on advair and singulair. asthma worsening.   . 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 Depression 2001   depressed since childhood   . 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 Kyphosis of thoracic region 2004    painful, runs in dad side   . Migraine   . Pancreatitis 2003    gallstone pancreatitis   . Pancreatitis due to biliary obstruction 2003  . PCOS (polycystic ovarian syndrome) 2014   facial hair, irregular periods, never been pregnant     Past Surgical History:  Procedure Laterality Date  . CHOLECYSTECTOMY  2003   . OPEN REDUCTION INTERNAL FIXATION (ORIF) PROXIMAL PHALANX Right 07/23/2013   Procedure: OPEN TREATMENT RIGHT RING FINGER, PROXIMAL INTERPHALANGEAL/JOINT FRACTURE DISLOCATION;  Surgeon: Jolyn Nap, MD;  Location: [facility];  Service: Orthopedics;  Laterality: Right;  . TONSILLECTOMY      Family History  Problem Relation Age of Onset  . Hypertension Mother   . Arnold-Chiari malformation Mother   . Restless legs syndrome Mother   . Osteoporosis Mother   . COPD Mother   . Asthma Mother   . Squamous cell carcinoma Mother        skin   . Eczema Mother   . Arthritis Mother   . Peripheral Artery Disease Mother   . Hypertension Father   . Scoliosis Father   . Bipolar disorder Father   . Congestive Heart Failure Father   . Diabetes Maternal Grandmother   . Hypertension Maternal Grandmother   . Cancer Paternal Grandmother        colon  . Bone cancer Maternal Grandfather 82       bone marrow   . Multiple myeloma Maternal Grandfather   . Diabetes Paternal Grandfather     Social History   Socioeconomic History  . Marital status: Single    Spouse name: Not on file  . Number of children: 0  . Years of education: college   . Highest education level: Not on file  Social Needs  . Financial resource strain: Not on file  . Food insecurity - worry: Not on file  . Food insecurity - inability: Not on file  . Transportation needs - medical: Not on file  . Transportation needs - non-medical: Not on file  Occupational History  . Occupation: Unemployed   Tobacco Use  . Smoking status: Never Smoker  . Smokeless tobacco: Never Used  Substance and Sexual Activity  . Alcohol use: No  . Drug use: No  . Sexual activity:  Yes    Birth control/protection: None  Other Topics Concern  . Not on file  Social History Narrative   Lives with mom [name])    Right-handed   Caffeine: 3 cups of coffee per day    Review of systems: Review of Systems  Constitutional: Negative for fever and chills.  HENT: Negative.   Eyes: Negative for blurred vision.  Respiratory: as per HPI  Cardiovascular: Negative for chest pain and palpitations.  Gastrointestinal: Negative for vomiting, diarrhea, blood per rectum. Genitourinary: Negative for dysuria, urgency, frequency and hematuria.  Musculoskeletal: Negative for myalgias, back pain and joint pain.  Skin: Negative for itching and rash.  Neurological: Negative for dizziness, tremors, focal weakness, seizures and loss of consciousness.  Endo/Heme/Allergies: Negative for environmental allergies.  Psychiatric/Behavioral: Negative for depression, suicidal ideas and hallucinations.  All other systems reviewed and are  negative.  Physical Exam: Blood pressure 124/80, pulse 89, height _0  (1.651 m), weight 295 lb 3.2 oz (133.9 kg), SpO2 98 %. Gen:      No acute distress HEENT:  EOMI, sclera anicteric Neck:     No masses; no thyromegaly Lungs:    Clear to auscultation bilaterally; normal respiratory effort CV:         Regular rate and rhythm; no murmurs Abd:      + bowel sounds; soft, non-tender; no palpable masses, no distension Ext:    No edema; adequate peripheral perfusion Skin:      Warm and dry; no rash Neuro: alert and oriented x 3 Psych: normal mood and affect  Data Reviewed: Chest x-ray 08/27/11-atelectasis, low lung volumes CT chest 01/23/17-small nonspecific mediastinal lymph nodes, lungs are clear. I have reviewed the images personally.  MRI brain 11/01/16-  1.     Brain parenchyma appears normal before and after contrast. 2.     The pituitary stalk is thickened.    This could be incidental, but could also occur with inflammatory disorder such as lymphocytic  infundibulo-hypophysitis, Langerhans cell histiocytosis, neurosarcoidosis another inflammatory etiologies.   The changes are stable when compared to the MRI dated 07/14/2016 making a neoplastic disorder less likely. 3.    Chronic maxillary and ethmoid sinusitis.  FENO 01/08/17-47  CBC 01/08/17-WBC 10.2, eosinophils 7.2%, absolute eosinophil count 700 Blood allergy profile 01/08/17- IgE 143, sensitive to cat and dog dander.  PFTs 02/19/17 FVC 5 [125%], pre-FEV1 3.61 [107%], post FEV1 4.16 [123%], 15 percent improvement, F/F 83 TLC 125%, DLCO 121% Small airways disease with bronchodilator response.  Consistent with asthma  Assessment:  Severe persistent asthma Symptoms have stabilized on Symbicort, albuterol and Singulair.  Continue the same therapy without change. 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  Neurosarcoid is on the differential and she follows with [facility] neurology.  No evidence of pulmonary sarcoid on CT of the chest  Plan/Recommendations: - Continue Symbicort, albuterol PRN,  Singulair  Marshell Garfinkel MD [facility] Pager [phone] 02/19/2017, 10:15 AM  CC: Ladell Pier, MD

## 2017-02-19 NOTE — Progress Notes (Signed)
PFT done today. 

## 2017-03-05 ENCOUNTER — Ambulatory Visit: Payer: Self-pay | Admitting: Neurology

## 2017-03-26 ENCOUNTER — Telehealth: Payer: Self-pay | Admitting: *Deleted

## 2017-03-26 NOTE — Telephone Encounter (Signed)
Pt calling is at [employer] [facility].  She is getting Dtap vacc and is asking of any contraindication to getting this vacc.  Per Dr. Lucia GaskinsAhern, she did not think a problem with getting this vacc.  Pt informed and would proceed.

## 2017-03-28 ENCOUNTER — Ambulatory Visit: Payer: Self-pay | Attending: Internal Medicine

## 2017-04-10 ENCOUNTER — Ambulatory Visit: Payer: Self-pay | Admitting: Internal Medicine

## 2017-04-16 NOTE — Telephone Encounter (Signed)
Received this notice from [employer]:  "This patient advised she does not have insurance and can not afford the Cpap.  We are trying to get her qualified for a donated machine that is wireless capable."

## 2017-04-17 ENCOUNTER — Ambulatory Visit: Payer: Self-pay | Admitting: Neurology

## 2017-04-17 ENCOUNTER — Telehealth: Payer: Self-pay | Admitting: Allergy & Immunology

## 2017-04-17 MED ORDER — DOXYCYCLINE HYCLATE 100 MG PO CAPS: 100.0000 mg | ORAL_CAPSULE | Freq: Two times a day (BID) | ORAL | 0 refills | Status: AC

## 2017-04-17 MED FILL — ?DOXYCYCLINE HYC 100MG CAP: 100 | 7 days supply | Qty: 14 | Fill #0

## 2017-04-17 NOTE — Telephone Encounter (Signed)
I called pt, spoke to pt's mother, Steward DroneBrenda, per [facility]. I advised her that since pt has not started her cpap, it will be best to postpone her follow up with Dr. Frances FurbishAthar until she has started the cpap. Pt's mother is agreeable to this and will let us know when the pt starts the cpap and we will get her rescheduled. Pt's appt has been cancelled.

## 2017-04-17 NOTE — Telephone Encounter (Signed)
I received a call from Ms. Kristen [name] reporting copious green mucous which has worsened acutely over the course of six days after getting better initially. She has been afebrile but is endorsing some problems with sinus pressure and pain. She does have an allergy to penicillin therefore I will send in a one week course of doxycycline. Patient informed and agrees with plan. I also recommended the use of symptomatic treatment.

## 2017-04-30 MED FILL — ?METFORMIN HCL 1,000 MG TAB: 1000 | 30 days supply | Qty: 60 | Fill #2

## 2017-06-30 ENCOUNTER — Encounter: Payer: Self-pay | Admitting: Adult Health

## 2017-06-30 ENCOUNTER — Encounter: Payer: Self-pay | Admitting: Internal Medicine

## 2017-07-15 ENCOUNTER — Encounter: Payer: Self-pay | Admitting: Neurology

## 2017-07-21 ENCOUNTER — Encounter: Payer: Self-pay | Admitting: Adult Health

## 2017-07-22 ENCOUNTER — Encounter: Payer: Self-pay | Admitting: Adult Health

## 2017-07-22 ENCOUNTER — Ambulatory Visit (INDEPENDENT_AMBULATORY_CARE_PROVIDER_SITE_OTHER): Payer: Self-pay | Admitting: Adult Health

## 2017-07-22 VITALS — BP 140/88 | HR 95 | Ht 65.0 in | Wt 307.5 lb

## 2017-07-22 DIAGNOSIS — G43009 Migraine without aura, not intractable, without status migrainosus: Secondary | ICD-10-CM

## 2017-07-22 DIAGNOSIS — R413 Other amnesia: Secondary | ICD-10-CM

## 2017-07-22 DIAGNOSIS — R404 Transient alteration of awareness: Secondary | ICD-10-CM

## 2017-07-22 DIAGNOSIS — Z9989 Dependence on other enabling machines and devices: Secondary | ICD-10-CM

## 2017-07-22 DIAGNOSIS — G4733 Obstructive sleep apnea (adult) (pediatric): Secondary | ICD-10-CM

## 2017-07-22 NOTE — Progress Notes (Addendum)
PATIENT: Kristen [name] DOB: 10-19-1985  REASON FOR VISIT: follow up HISTORY FROM: patient  HISTORY OF PRESENT ILLNESS: Today 07/22/17 Kristen [name] is a 32 year old female with a history of migraine headaches and obstructive sleep apnea.  She returns today for follow-up.  She has a new complaint today of memory loss.  She states that this has occurred in the last 2 to 3 weeks.  She reports that she forgets things that she is immediately told.  She reports that if she does not write it down she will not remember.  She reports that she can be in the middle of conversation and will forget what words she is searching for.  She lives at home with her mother.  Reports that she manages her own finances.  Able to prepare her own meals.  Able to complete all ADLs independently.  No trouble operating a motor vehicle.  She reports that she does get stressed very easily.  Reports that she has had anxiety since she was a child.  She reports that her headaches are manageable.  She states that she has approximate 1 migraine a week.  She reports a daily dull headache.  She reports that the length of these headaches vary.  She is currently trying to conceive therefore she is not on any medication for her headaches.  Patient also reports staring events.  She reports that these have been witnessed by her mother and significant other.  Her mother reports that she will be staring right at her but will not respond.  The patient reports that she uses a CPAP.  Her CPAP download indicates that she use her machine 21 out of 30 days for compliance of 70%.  She use her machine greater than 4 hours 14 out of 30 days for compliance of 47%.  This indicates suboptimal compliance.  On average she uses the machine 5 hours and 24 minutes.  She is on AutoSet 4 to 10 cm of water with EPR 3.  Her residual AHI is 1.1.  She does not have a significant leak. She returns today for an evaluation.  HISTORY 01/21/17 Kristen [name] is a 32 year old  female with a history of migraine headaches and obstructive sleep apnea.  She returns today for an evaluation.  She reports that she stopped Topamax because she is interested in getting pregnant.  She reports that her headache frequency has actually decreased.  Reports that she was having approximately 3 headaches a week and now she is only having one headache a week.  She states that when she does get a headache it typically occurs in the center of the forehead.  She does report photophobia but no phonophobia.  She denies nausea and vomiting.  She denies any significant changes with her vision.  Reports on occasion she will have some blurred or double vision however she states that this is usually brief.  She states that she typically can take naproxen and lay down and her headache will resolve in approximately 2 hours.  The patient was diagnosed with mild to moderate sleep apnea.  CPAP therapy was suggested.  However the patient has not yet started using the CPAP.  She returns today for an evaluation.   REVIEW OF SYSTEMS: Out of a complete 14 system review of symptoms, the patient complains only of the following symptoms, and all other reviewed systems are negative.  Fatigue, runny nose, drooling, cough, wheezing, shortness of breath, chest tightness, light sensitivity, eye itching, eye redness, blurred  vision, excessive thirst, swollen abdomen, apnea, frequent waking, daytime sleepiness, snoring, back pain, bruise easily, anemia, memory loss, dizziness, headache, speech difficulty, weakness, agitation, confusion, increased concentration, depression, nervous/anxious  ALLERGIES: Allergies  Allergen Reactions  . Mucinex [Guaifenesin Er] Hives, Itching and Swelling  . Penicillins Swelling  . Contrast Media [Iodinated Diagnostic Agents] Nausea And Vomiting  . Multihance [Gadobenate] Nausea And Vomiting    HOME MEDICATIONS: Outpatient Medications Prior to Visit  Medication Sig Dispense Refill  .  albuterol (PROVENTIL HFA;VENTOLIN HFA) 108 (90 Base) MCG/ACT inhaler Inhale 2 puffs every 6 (six) hours as needed into the lungs for wheezing or shortness of breath. 6.7 g 3  . albuterol (PROVENTIL) (2.5 MG/3ML) 0.083% nebulizer solution Take 3 mLs (2.5 mg total) every 6 (six) hours as needed by nebulization for wheezing or shortness of breath. 50 vial 5  . budesonide-formoterol (SYMBICORT) 160-4.5 MCG/ACT inhaler Inhale 2 puffs 2 (two) times daily into the lungs. 1 Inhaler 12  . hydrOXYzine (VISTARIL) 50 MG capsule Take 1 capsule (50 mg total) by mouth 3 (three) times daily as needed. 60 capsule 0  . metFORMIN (GLUCOPHAGE) 1000 MG tablet Take one tablet twice a day. (Patient taking differently: Take 500 mg by mouth 2 (two) times daily with a meal. Take one tablet twice a day.) 60 tablet 5  . montelukast (SINGULAIR) 10 MG tablet Take 1 tablet (10 mg total) by mouth daily. 30 tablet 2  . Multiple Vitamin (MULTIVITAMIN) tablet Take 1 tablet by mouth daily.    . naproxen (NAPROSYN) 500 MG tablet TAKE 1 TABLET BY MOUTH 2 TIMES DAILY WITH A MEAL. 30 tablet 0  . bromocriptine (PARLODEL) 2.5 MG tablet Take 0.5 tablets (1.25 mg total) by mouth at bedtime. (Patient not taking: Reported on 07/22/2017) 15 tablet 11  . letrozole (FEMARA) 2.5 MG tablet Take 1 tablet (2.5 mg total) by mouth daily. Take on days 3 to 7 following menstrual period (Patient not taking: Reported on 07/22/2017) 5 tablet 1   No facility-administered medications prior to visit.     PAST MEDICAL HISTORY: Past Medical History:  Diagnosis Date  . Anxiety 2013   treated at [facility] by Pauline Good   . Asthma 1989    no hospitalization, or intubation, previously on advair and singulair. asthma worsening.   . 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 Depression 2001   depressed since childhood   . 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 Kyphosis of thoracic region 2004    painful, runs in dad side   . Migraine   . Pancreatitis  2003   gallstone pancreatitis   . Pancreatitis due to biliary obstruction 2003  . PCOS (polycystic ovarian syndrome) 2014   facial hair, irregular periods, never been pregnant     PAST SURGICAL HISTORY: Past Surgical History:  Procedure Laterality Date  . CHOLECYSTECTOMY  2003   . OPEN REDUCTION INTERNAL FIXATION (ORIF) PROXIMAL PHALANX Right 07/23/2013   Procedure: OPEN TREATMENT RIGHT RING FINGER, PROXIMAL INTERPHALANGEAL/JOINT FRACTURE DISLOCATION;  Surgeon: Jolyn Nap, MD;  Location: [facility];  Service: Orthopedics;  Laterality: Right;  . TONSILLECTOMY      FAMILY HISTORY: Family History  Problem Relation Age of Onset  . Hypertension Mother   . Arnold-Chiari malformation Mother   . Restless legs syndrome Mother   . Osteoporosis Mother   . COPD Mother   . Asthma Mother   . Squamous cell carcinoma Mother  skin   . Eczema Mother   . Arthritis Mother   . Peripheral Artery Disease Mother   . Hypertension Father   . Scoliosis Father   . Bipolar disorder Father   . Congestive Heart Failure Father   . Diabetes Maternal Grandmother   . Hypertension Maternal Grandmother   . Cancer Paternal Grandmother        colon  . Bone cancer Maternal Grandfather 82       bone marrow   . Multiple myeloma Maternal Grandfather   . Diabetes Paternal Grandfather     SOCIAL HISTORY: Social History   Socioeconomic History  . Marital status: Single    Spouse name: Not on file  . Number of children: 0  . Years of education: college   . Highest education level: Not on file  Occupational History  . Occupation: Unemployed   Social Needs  . Financial resource strain: Not on file  . Food insecurity:    Worry: Not on file    Inability: Not on file  . Transportation needs:    Medical: Not on file    Non-medical: Not on file  Tobacco Use  . Smoking status: Never Smoker  . Smokeless tobacco: Never Used  Substance and Sexual Activity  . Alcohol use: No  .  Drug use: No  . Sexual activity: Yes    Birth control/protection: None  Lifestyle  . Physical activity:    Days per week: Not on file    Minutes per session: Not on file  . Stress: Not on file  Relationships  . Social connections:    Talks on phone: Not on file    Gets together: Not on file    Attends religious service: Not on file    Active member of club or organization: Not on file    Attends meetings of clubs or organizations: Not on file    Relationship status: Not on file  . Intimate partner violence:    Fear of current or ex partner: Not on file    Emotionally abused: Not on file    Physically abused: Not on file    Forced sexual activity: Not on file  Other Topics Concern  . Not on file  Social History Narrative   Lives with mom [name])    Right-handed   Caffeine: 3 cups of coffee per day      PHYSICAL EXAM  Vitals:   07/22/17 1256  BP: 140/88  Pulse: 95  Weight: (!) 307 lb 8 oz (139.5 kg)  Height: 5' 5"  (1.651 m)   Body mass index is 51.17 kg/m.  Generalized: Well developed, in no acute distress   Neurological examination  Mentation: Alert oriented to time, place, history taking. Follows all commands speech and language fluent Cranial nerve II-XII: Pupils were equal round reactive to light. Extraocular movements were full, visual field were full on confrontational test. Facial sensation and strength were normal. Uvula tongue midline. Head turning and shoulder shrug  were normal and symmetric. Motor: The motor testing reveals 5 over 5 strength of all 4 extremities. Good symmetric motor tone is noted throughout.  Sensory: Sensory testing is intact to soft touch on all 4 extremities. No evidence of extinction is noted.  Coordination: Cerebellar testing reveals good finger-nose-finger and heel-to-shin bilaterally.  Gait and station: Gait is normal.  Reflexes: Deep tendon reflexes are symmetric and normal bilaterally.   DIAGNOSTIC DATA (LABS, IMAGING,  TESTING) - I reviewed patient records, labs, notes, testing and imaging  myself where available.  Lab Results  Component Value Date   WBC 10.2 01/08/2017   HGB 15.1 (H) 01/08/2017   HCT 45.5 01/08/2017   MCV 87.1 01/08/2017   PLT 234.0 01/08/2017      Component Value Date/Time   NA 144 11/25/2016 0958   K 4.0 11/25/2016 0958   CL 106 11/25/2016 0958   CO2 23 11/25/2016 0958   GLUCOSE 84 11/25/2016 0958   GLUCOSE 100 (H) 09/18/2016 1418   BUN 9 11/25/2016 0958   CREATININE 0.96 11/25/2016 0958   CREATININE 1.09 03/25/2014 1730   CALCIUM 9.6 11/25/2016 0958   PROT 6.8 11/25/2016 0958   ALBUMIN 4.4 11/25/2016 0958   AST 20 11/25/2016 0958   ALT 31 11/25/2016 0958   ALKPHOS 130 (H) 11/25/2016 0958   BILITOT 0.5 11/25/2016 0958   GFRNONAA 80 11/25/2016 0958   GFRNONAA 70 03/25/2014 1730   GFRAA 92 11/25/2016 0958   GFRAA 80 03/25/2014 1730    Lab Results  Component Value Date   HGBA1C 5.0 04/09/2016   No results found for: [id number] Lab Results  Component Value Date   TSH 1.37 09/18/2016      ASSESSMENT AND PLAN 32 y.o. year old female  has a past medical history of Anxiety (2013), Asthma (1989 ), Congenital third kidney (1989 ), Depression (2001), Dysrhythmia (2010), Kyphosis of thoracic region (2004 ), Migraine, Pancreatitis (2003), Pancreatitis due to biliary obstruction (2003), and PCOS (polycystic ovarian syndrome) (2014). here with:  1.  Migraine headaches 2.  Memory disturbance 3.  Obstructive sleep apnea on CPAP 4.  Staring episodes   The patient is currently trying to conceive therefore we will not place her on any medication for her headaches.  Patient is amenable to that plan.  The patient has a new complaint of memory disturbance.  I will check blood work today to look for reversible causes.  In addition we may consider repeating MRI of the brain.  The patient also reports staring episodes.  She has a family history of seizures.  I will order EEG to  evaluate.  The patient is encouraged to use the CPAP nightly and greater than 4 hours each night.  I have explained that untreated sleep apnea can effect the memory.  Also advised the patient that if she is having trouble managing her anxiety and stress she should schedule an appt with her PCP.  She voiced understanding.  She will follow-up in 6 months or sooner if needed.    Ward Givens, MSN, NP-C 07/22/2017, 1:01 PM [facility] [address], [place], [place] [address] [phone]  I reviewed the above note and documentation by the Nurse Practitioner and agree with the history, physical exam, assessment and plan as outlined above. I was immediately available for face-to-face consultation. Star Age, MD, PhD [facility])

## 2017-07-22 NOTE — Patient Instructions (Signed)
Your Plan:  Continue to monitor headaches Use CPAP nightly and >4 hours each night Blood work today for memory In the future we may repeat MRI brain If your symptoms worsen or you develop new symptoms please let us know.        [facility]. I hope we have been able to provide you high quality care today.  You may receive a patient satisfaction survey over the next few weeks. We would appreciate your feedback and comments so that we may continue to improve ourselves and the health of our patients.

## 2017-07-23 ENCOUNTER — Telehealth: Payer: Self-pay | Admitting: *Deleted

## 2017-07-23 LAB — TSH: TSH: 2.14 u[IU]/mL (ref 0.450–4.500)

## 2017-07-23 LAB — COMPREHENSIVE METABOLIC PANEL
A/G RATIO: 1.9 (ref 1.2–2.2)
ALT: 21 IU/L (ref 0–32)
AST: 16 IU/L (ref 0–40)
Albumin: 4.2 g/dL (ref 3.5–5.5)
Alkaline Phosphatase: 118 IU/L — ABNORMAL HIGH (ref 39–117)
BUN/Creatinine Ratio: 10 (ref 9–23)
BUN: 9 mg/dL (ref 6–20)
Bilirubin Total: 0.4 mg/dL (ref 0.0–1.2)
CHLORIDE: 102 mmol/L (ref 96–106)
CO2: 23 mmol/L (ref 20–29)
Calcium: 9.5 mg/dL (ref 8.7–10.2)
Creatinine, Ser: 0.92 mg/dL (ref 0.57–1.00)
GFR calc Af Amer: 97 mL/min/{1.73_m2} (ref >59)
GFR calc non Af Amer: 84 mL/min/{1.73_m2} (ref >59)
GLOBULIN, TOTAL: 2.2 g/dL (ref 1.5–4.5)
Glucose: 91 mg/dL (ref 65–99)
POTASSIUM: 4.2 mmol/L (ref 3.5–5.2)
SODIUM: 141 mmol/L (ref 134–144)
Total Protein: 6.4 g/dL (ref 6.0–8.5)

## 2017-07-23 LAB — CBC WITH DIFFERENTIAL/PLATELET
BASOS ABS: 0 10*3/uL (ref 0.0–0.2)
BASOS: 0 %
EOS (ABSOLUTE): 0.5 10*3/uL — AB (ref 0.0–0.4)
Eos: 5 %
Hematocrit: 41.3 % (ref 34.0–46.6)
Hemoglobin: 13.7 g/dL (ref 11.1–15.9)
IMMATURE GRANULOCYTES: 0 %
Immature Grans (Abs): 0 10*3/uL (ref 0.0–0.1)
Lymphocytes Absolute: 3.3 10*3/uL — ABNORMAL HIGH (ref 0.7–3.1)
Lymphs: 34 %
MCH: 29 pg (ref 26.6–33.0)
MCHC: 33.2 g/dL (ref 31.5–35.7)
MCV: 88 fL (ref 79–97)
MONOS ABS: 0.4 10*3/uL (ref 0.1–0.9)
Monocytes: 4 %
NEUTROS PCT: 57 %
Neutrophils Absolute: 5.7 10*3/uL (ref 1.4–7.0)
PLATELETS: 267 10*3/uL (ref 150–450)
RBC: 4.72 x10E6/uL (ref 3.77–5.28)
RDW: 13.9 % (ref 12.3–15.4)
WBC: 10 10*3/uL (ref 3.4–10.8)

## 2017-07-23 LAB — VITAMIN B12: VITAMIN B 12: 346 pg/mL (ref 232–1245)

## 2017-07-23 NOTE — Telephone Encounter (Signed)
Spoke with patient's mother, Steward [place]Brenda on [place] who stated the patient is at work. She won't be home until 5:30. This RN  informed her that patient's blood work is unremarkable. She verbalized understanding, stated she would let [facility] know.

## 2017-07-30 NOTE — Progress Notes (Signed)
Personally  participated in, made any corrections needed, and agree with history, physical, neuro exam,assessment and plan as stated above.    Tawn Fitzner, MD [facility] 

## 2017-08-12 ENCOUNTER — Ambulatory Visit: Payer: Self-pay | Admitting: Neurology

## 2017-08-12 DIAGNOSIS — R404 Transient alteration of awareness: Secondary | ICD-10-CM

## 2017-08-12 DIAGNOSIS — R410 Disorientation, unspecified: Secondary | ICD-10-CM

## 2017-08-13 ENCOUNTER — Encounter: Payer: Self-pay | Admitting: Neurology

## 2017-08-13 NOTE — Procedures (Signed)
   HISTORY: 32 years old female, with history of migraine headache, obstructive sleep apnea, also complains of memory loss,  TECHNIQUE:  16 channel EEG was performed based on standard 10-16 international system. One channel was dedicated to EKG, which has demonstrates regular rhythm of 108 beats per minutes.  Upon awakening, the posterior background activity was well-developed, in alpha range, 10 Hz, reactive to eye opening and closure.  There was no evidence of epileptiform discharge. There was frequent bifrontal electrode artifact.  Photic stimulation was performed, which induced a symmetric photic driving.  Hyperventilation was performed, there was no abnormality elicit.  No sleep was achieved.  CONCLUSION: This is a  normal awake EEG.  There is no electrodiagnostic evidence of epileptiform discharge.  Kristen [name], M.D. Ph.D.  [facility] [address] [place], [place] [address] Phone: [phone] Fax:      [phone]

## 2017-08-14 ENCOUNTER — Telehealth: Payer: Self-pay | Admitting: *Deleted

## 2017-08-14 NOTE — Telephone Encounter (Signed)
LVM informing patient her EEG was normal. Left number for any questions.

## 2018-02-19 ENCOUNTER — Ambulatory Visit: Payer: Self-pay | Admitting: Adult Health

## 2018-02-27 ENCOUNTER — Other Ambulatory Visit: Payer: Self-pay

## 2018-02-27 ENCOUNTER — Emergency Department (HOSPITAL_BASED_OUTPATIENT_CLINIC_OR_DEPARTMENT_OTHER)
Admission: EM | Admit: 2018-02-27 | Discharge: 2018-02-27 | Disposition: A | Discharge status: Home / Self Care | Payer: No Typology Code available for payment source | Attending: Emergency Medicine | Admitting: Emergency Medicine

## 2018-02-27 ENCOUNTER — Encounter (HOSPITAL_BASED_OUTPATIENT_CLINIC_OR_DEPARTMENT_OTHER): Payer: Self-pay | Admitting: Emergency Medicine

## 2018-02-27 DIAGNOSIS — Z79899 Other long term (current) drug therapy: Secondary | ICD-10-CM | POA: Insufficient documentation

## 2018-02-27 DIAGNOSIS — M5442 Lumbago with sciatica, left side: Secondary | ICD-10-CM | POA: Insufficient documentation

## 2018-02-27 DIAGNOSIS — J45909 Unspecified asthma, uncomplicated: Secondary | ICD-10-CM | POA: Insufficient documentation

## 2018-02-27 DIAGNOSIS — Z880 Allergy status to penicillin: Secondary | ICD-10-CM | POA: Insufficient documentation

## 2018-02-27 MED ORDER — ACETAMINOPHEN 500 MG PO TABS
1000.0000 mg | ORAL_TABLET | Freq: Once | ORAL | Status: AC
  Administered 2018-02-27: 1000 mg via ORAL
  Filled 2018-02-27: qty 2

## 2018-02-27 MED ORDER — OXYCODONE HCL 5 MG PO TABS
5.0000 mg | ORAL_TABLET | Freq: Once | ORAL | Status: AC
  Administered 2018-02-27: 5 mg via ORAL
  Filled 2018-02-27: qty 1

## 2018-02-27 MED ORDER — DIAZEPAM 5 MG PO TABS
5.0000 mg | ORAL_TABLET | Freq: Once | ORAL | Status: AC
  Administered 2018-02-27: 5 mg via ORAL
  Filled 2018-02-27: qty 1

## 2018-02-27 MED ORDER — KETOROLAC TROMETHAMINE 15 MG/ML IJ SOLN
15.0000 mg | Freq: Once | INTRAMUSCULAR | Status: AC
  Administered 2018-02-27: 15 mg via INTRAMUSCULAR
  Filled 2018-02-27: qty 1

## 2018-02-27 NOTE — ED Triage Notes (Signed)
Pt bent over to pick up a box from the floor yesterday and heard a "snap" in her left low back.  Has pain in left low back from spine toward the left and upward. Sts when she presses on her low back it shoots a pain down her left buttock.

## 2018-02-27 NOTE — Discharge Instructions (Signed)

## 2018-02-27 NOTE — ED Provider Notes (Signed)
[facility] EMERGENCY DEPARTMENT Provider Note   CSN: [id number] Arrival date & time: 02/27/18  0847     History   Chief Complaint Chief Complaint  Patient presents with  . Back Pain    HPI Kristen [name] is a 33 y.o. female.  33 yo F with a chief complaints of left-sided low back pain.  The patient states that she was doing something about a week ago when she felt a pop.  Since then she has had some sharp and shooting pain that goes down to her left buttock.  Worse with bending or twisting.  She denies numbness or weakness to the leg denies change to bowel or bladder.  Denies loss of perirectal sensation.  Denies trauma.  Denies IV drug abuse.  Denies recent surgery or instrumentation to her low back.  She denies fevers or chills.  The history is provided by the patient.  Back Pain  Location:  Lumbar spine Quality:  Aching and shooting Radiates to: L buttock. Pain severity:  Mild Pain is:  Same all the time Onset quality:  Gradual Duration:  1 week Timing:  Constant Progression:  Unchanged Chronicity:  New Context: physical stress   Context: not MCA and not MVA   Relieved by:  Being still Worsened by:  Nothing Ineffective treatments:  None tried Associated symptoms: no chest pain, no dysuria, no fever and no headaches     Past Medical History:  Diagnosis Date  . Anxiety 2013   treated at [facility] by Pauline Good   . Asthma 1989    no hospitalization, or intubation, previously on advair and singulair. asthma worsening.   . 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 Depression 2001   depressed since childhood   . 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 Kyphosis of thoracic region 2004    painful, runs in dad side   . Migraine   . Pancreatitis 2003   gallstone pancreatitis   . Pancreatitis due to biliary obstruction 2003  . PCOS (polycystic ovarian syndrome) 2014   facial hair, irregular periods, never been pregnant     Patient Active  Problem List   Diagnosis Date Noted  . 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 Elevated prolactin level ([place]) 07/05/2016  . Vulvar lesion 07/05/2016  . Restless legs 05/24/2016  . Seasonal allergies 05/13/2014  . Intertrigo 05/13/2014  . Decreased vision 05/13/2014  . PCOS (polycystic ovarian syndrome) 02/07/2014  . Social phobia 02/02/2007  . ADD 02/02/2007  . 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 Asthma, severe persistent 02/02/2007  . SCOLIOSIS 02/02/2007  . OTHER SPECIFIED CONGENITAL ANOMALIES OF KIDNEY 02/02/2007  . ELECTROCARDIOGRAM, ABNORMAL 02/02/2007    Past Surgical History:  Procedure Laterality Date  . CHOLECYSTECTOMY  2003   . OPEN REDUCTION INTERNAL FIXATION (ORIF) PROXIMAL PHALANX Right 07/23/2013   Procedure: OPEN TREATMENT RIGHT RING FINGER, PROXIMAL INTERPHALANGEAL/JOINT FRACTURE DISLOCATION;  Surgeon: Jolyn Nap, MD;  Location: [facility];  Service: Orthopedics;  Laterality: Right;  . TONSILLECTOMY       OB History    Gravida  0   Para      Term      Preterm      AB      Living        SAB      TAB      Ectopic      Multiple      Live Births  Home Medications    Prior to Admission medications   Medication Sig Start Date End Date Taking? Authorizing Provider  albuterol (PROVENTIL HFA;VENTOLIN HFA) 108 (90 Base) MCG/ACT inhaler Inhale 2 puffs every 6 (six) hours as needed into the lungs for wheezing or shortness of breath. 12/11/16   Elsie Stain, MD  albuterol (PROVENTIL) (2.5 MG/3ML) 0.083% nebulizer solution Take 3 mLs (2.5 mg total) every 6 (six) hours as needed by nebulization for wheezing or shortness of breath. 12/23/16   Ladell Pier, MD  bromocriptine (PARLODEL) 2.5 MG tablet Take 0.5 tablets (1.25 mg total) by mouth at bedtime. Patient not taking: Reported on 07/22/2017 10/11/16   Renato Shin, MD  budesonide-formoterol [facility]) 160-4.5 MCG/ACT inhaler Inhale 2 puffs 2 (two) times daily into  the lungs. 12/11/16 12/11/17  Elsie Stain, MD  hydrOXYzine (VISTARIL) 50 MG capsule Take 1 capsule (50 mg total) by mouth 3 (three) times daily as needed. 08/16/16   Funches, Adriana Mccallum, MD  letrozole (FEMARA) 2.5 MG tablet Take 1 tablet (2.5 mg total) by mouth daily. Take on days 3 to 7 following menstrual period Patient not taking: Reported on 07/22/2017 01/22/17   Osborne Oman, MD  metFORMIN (GLUCOPHAGE) 1000 MG tablet Take one tablet twice a day. Patient taking differently: Take 500 mg by mouth 2 (two) times daily with a meal. Take one tablet twice a day. 11/25/16   Anyanwu, Sallyanne Havers, MD  montelukast (SINGULAIR) 10 MG tablet Take 1 tablet (10 mg total) by mouth daily. 01/08/17 01/08/18  Marshell Garfinkel, MD  Multiple Vitamin (MULTIVITAMIN) tablet Take 1 tablet by mouth daily.    [provider]  naproxen (NAPROSYN) 500 MG tablet TAKE 1 TABLET BY MOUTH 2 TIMES DAILY WITH A MEAL. 08/29/16   Boykin Nearing, MD    Family History Family History  Problem Relation Age of Onset  . Hypertension Mother   . Arnold-Chiari malformation Mother   . Restless legs syndrome Mother   . Osteoporosis Mother   . COPD Mother   . Asthma Mother   . Squamous cell carcinoma Mother        skin   . Eczema Mother   . Arthritis Mother   . Peripheral Artery Disease Mother   . Hypertension Father   . Scoliosis Father   . Bipolar disorder Father   . Congestive Heart Failure Father   . COPD Father   . Diabetes Maternal Grandmother   . Hypertension Maternal Grandmother   . Cancer Paternal Grandmother        colon  . Bone cancer Maternal Grandfather 82       bone marrow   . Multiple myeloma Maternal Grandfather   . Diabetes Paternal Grandfather     Social History Social History   Tobacco Use  . Smoking status: Never Smoker  . Smokeless tobacco: Never Used  Substance Use Topics  . Alcohol use: No  . Drug use: No     Allergies   Mucinex [guaifenesin er]; Penicillins; Contrast media  [iodinated diagnostic agents]; and Multihance [gadobenate]   Review of Systems Review of Systems  Constitutional: Negative for chills and fever.  HENT: Negative for congestion and rhinorrhea.   Eyes: Negative for redness and visual disturbance.  Respiratory: Negative for shortness of breath and wheezing.   Cardiovascular: Negative for chest pain and palpitations.  Gastrointestinal: Negative for nausea and vomiting.  Genitourinary: Negative for dysuria and urgency.  Musculoskeletal: Positive for back pain. Negative for arthralgias and myalgias.  Skin: Negative  for pallor and wound.  Neurological: Negative for dizziness and headaches.     Physical Exam Updated Vital Signs BP (!) 145/101 (BP Location: Left Arm)   Pulse (!) 110   Temp 98 F (36.7 C) (Oral)   Resp 20   Ht 5' 6.5" (1.689 m)   Wt (!) 143.1 kg   LMP 02/08/2018   SpO2 100%   BMI 50.15 kg/m   Physical Exam Vitals signs and nursing note reviewed.  Constitutional:      General: She is not in acute distress.    Appearance: She is well-developed. She is obese. She is not diaphoretic.  HENT:     Head: Normocephalic and atraumatic.  Eyes:     Pupils: Pupils are equal, round, and reactive to light.  Neck:     Musculoskeletal: Normal range of motion and neck supple.  Cardiovascular:     Rate and Rhythm: Normal rate and regular rhythm.     Heart sounds: No murmur. No friction rub. No gallop.   Pulmonary:     Effort: Pulmonary effort is normal.     Breath sounds: No wheezing or rales.  Abdominal:     General: There is no distension.     Palpations: Abdomen is soft.     Tenderness: There is no abdominal tenderness.  Musculoskeletal:        General: Tenderness present.     Comments: Mild tenderness about the mid low back.  No pain at the piriformis.  Pulse motor and sensation is intact to the left lower extremity.  She has mild worsening of her pain with forward flexion of the leg up to about 45 degrees.  Skin:     General: Skin is warm and dry.  Neurological:     Mental Status: She is alert and oriented to person, place, and time.  Psychiatric:        Behavior: Behavior normal.      ED Treatments / Results  Labs (all labs ordered are listed, but only abnormal results are displayed) Labs Reviewed - No data to display  EKG None  Radiology No results found.  Procedures Procedures (including critical care time)  Medications Ordered in ED Medications  ketorolac (TORADOL) 15 MG/ML injection 15 mg (has no administration in time range)  acetaminophen (TYLENOL) tablet 1,000 mg (1,000 mg Oral Given 02/27/18 0954)  oxyCODONE (Oxy IR/ROXICODONE) immediate release tablet 5 mg (5 mg Oral Given 02/27/18 0954)  diazepam (VALIUM) tablet 5 mg (5 mg Oral Given 02/27/18 0955)     Initial Impression / Assessment and Plan / ED Course  I have reviewed the triage vital signs and the nursing notes.  Pertinent labs & imaging results that were available during my care of the patient were reviewed by me and considered in my medical decision making (see chart for details).     33 yo F with a chief complaint of left-sided low back pain.  Most likely musculoskeletal by history and physical.  She has no red flags.  I feel that imaging is warranted.  We will treat symptomatically.  PCP follow-up.  Final Clinical Impressions(s) / ED Diagnoses   Final diagnoses:  Acute left-sided low back pain with left-sided sciatica    ED Discharge Orders    None       Deno Etienne, DO 02/27/18 [phone]

## 2018-03-24 ENCOUNTER — Inpatient Hospital Stay: Payer: No Typology Code available for payment source | Admitting: Critical Care Medicine

## 2018-03-24 NOTE — Progress Notes (Deleted)
Subjective:    Patient ID: Kristen [name], female    DOB: 24-Nov-1985, 33 y.o.   MRN: [id number]  33 y.o.F with asthma seen ED 11/3 with asthma flare, Rx and released.  Hx of asthma since age 78.  Pt worse as got older.  Pt well until 12/07/16 and came by EMS.  Rx nebs and steroids.  Now is better.  ?allergies or URI.    Note endocrine took pt off steroids and pt is better  Not seen since 12/2016 for persistent asthma.   In ED 1/24 for back pain.   Asthma  She complains of chest tightness, cough, difficulty breathing, frequent throat clearing, hoarse voice, shortness of breath and wheezing. There is no hemoptysis or sputum production. This is a recurrent problem. The current episode started more than 1 year ago. The problem occurs 2 to 4 times per day (symptoms worsened over a week prior to ED visit ). The problem has been gradually improving. The cough is non-productive, dry, nocturnal, paroxysmal, hoarse and supine. Associated symptoms include dyspnea on exertion, headaches, heartburn, malaise/fatigue, nasal congestion, orthopnea, PND, postnasal drip, rhinorrhea and sneezing. Pertinent negatives include no appetite change, chest pain, ear congestion, ear pain, fever, myalgias, sore throat or trouble swallowing. Her symptoms are aggravated by minimal activity, emotional stress, exercise, change in weather, pollen, URI and occupational exposure (chlorine ). Her symptoms are alleviated by beta-agonist and oral steroids. Her past medical history is significant for asthma and bronchitis. There is no history of pneumonia.   Past Medical History:  Diagnosis Date  . Anxiety 2013   treated at [facility] by Pauline Good   . Asthma 1989    no hospitalization, or intubation, previously on advair and singulair. asthma worsening.   . 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 Depression 2001   depressed since childhood   . 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 Kyphosis of thoracic region 2004    painful, runs in dad side   . Migraine   . Pancreatitis 2003   gallstone pancreatitis   . Pancreatitis due to biliary obstruction 2003  . PCOS (polycystic ovarian syndrome) 2014   facial hair, irregular periods, never been pregnant      Family History  Problem Relation Age of Onset  . Hypertension Mother   . Arnold-Chiari malformation Mother   . Restless legs syndrome Mother   . Osteoporosis Mother   . COPD Mother   . Asthma Mother   . Squamous cell carcinoma Mother        skin   . Eczema Mother   . Arthritis Mother   . Peripheral Artery Disease Mother   . Hypertension Father   . Scoliosis Father   . Bipolar disorder Father   . Congestive Heart Failure Father   . COPD Father   . Diabetes Maternal Grandmother   . Hypertension Maternal Grandmother   . Cancer Paternal Grandmother        colon  . Bone cancer Maternal Grandfather 82       bone marrow   . Multiple myeloma Maternal Grandfather   . Diabetes Paternal Grandfather      Social History   Socioeconomic History  . Marital status: Single    Spouse name: Not on file  . Number of children: 0  . Years of education: college   . Highest education level: Not on file  Occupational History  . Occupation: Unemployed   Social  Needs  . Financial resource strain: Not on file  . Food insecurity:    Worry: Not on file    Inability: Not on file  . Transportation needs:    Medical: Not on file    Non-medical: Not on file  Tobacco Use  . Smoking status: Never Smoker  . Smokeless tobacco: Never Used  Substance and Sexual Activity  . Alcohol use: No  . Drug use: No  . Sexual activity: Yes    Birth control/protection: None  Lifestyle  . Physical activity:    Days per week: Not on file    Minutes per session: Not on file  . Stress: Not on file  Relationships  . Social connections:    Talks on phone: Not on file    Gets together: Not on file    Attends religious service: Not on file    Active member of club or  organization: Not on file    Attends meetings of clubs or organizations: Not on file    Relationship status: Not on file  . Intimate partner violence:    Fear of current or ex partner: Not on file    Emotionally abused: Not on file    Physically abused: Not on file    Forced sexual activity: Not on file  Other Topics Concern  . Not on file  Social History Narrative   Lives with mom [name])    Right-handed   Caffeine: 3 cups of coffee per day     Allergies  Allergen Reactions  . Mucinex [Guaifenesin Er] Hives, Itching and Swelling  . Penicillins Swelling  . Contrast Media [Iodinated Diagnostic Agents] Nausea And Vomiting  . Multihance [Gadobenate] Nausea And Vomiting     Outpatient Medications Prior to Visit  Medication Sig Dispense Refill  . albuterol (PROVENTIL HFA;VENTOLIN HFA) 108 (90 Base) MCG/ACT inhaler Inhale 2 puffs every 6 (six) hours as needed into the lungs for wheezing or shortness of breath. 6.7 g 3  . albuterol (PROVENTIL) (2.5 MG/3ML) 0.083% nebulizer solution Take 3 mLs (2.5 mg total) every 6 (six) hours as needed by nebulization for wheezing or shortness of breath. 50 vial 5  . bromocriptine (PARLODEL) 2.5 MG tablet Take 0.5 tablets (1.25 mg total) by mouth at bedtime. (Patient not taking: Reported on 07/22/2017) 15 tablet 11  . budesonide-formoterol (SYMBICORT) 160-4.5 MCG/ACT inhaler Inhale 2 puffs 2 (two) times daily into the lungs. 1 Inhaler 12  . hydrOXYzine (VISTARIL) 50 MG capsule Take 1 capsule (50 mg total) by mouth 3 (three) times daily as needed. 60 capsule 0  . letrozole (FEMARA) 2.5 MG tablet Take 1 tablet (2.5 mg total) by mouth daily. Take on days 3 to 7 following menstrual period (Patient not taking: Reported on 07/22/2017) 5 tablet 1  . metFORMIN (GLUCOPHAGE) 1000 MG tablet Take one tablet twice a day. (Patient taking differently: Take 500 mg by mouth 2 (two) times daily with a meal. Take one tablet twice a day.) 60 tablet 5  . montelukast (SINGULAIR)  10 MG tablet Take 1 tablet (10 mg total) by mouth daily. 30 tablet 2  . Multiple Vitamin (MULTIVITAMIN) tablet Take 1 tablet by mouth daily.    . naproxen (NAPROSYN) 500 MG tablet TAKE 1 TABLET BY MOUTH 2 TIMES DAILY WITH A MEAL. 30 tablet 0   No facility-administered medications prior to visit.       Review of Systems  Constitutional: Positive for malaise/fatigue. Negative for appetite change and fever.  HENT: Positive for hoarse  voice, postnasal drip, rhinorrhea and sneezing. Negative for ear pain, sore throat and trouble swallowing.   Respiratory: Positive for cough, shortness of breath and wheezing. Negative for hemoptysis and sputum production.   Cardiovascular: Positive for dyspnea on exertion and PND. Negative for chest pain.  Gastrointestinal: Positive for heartburn.  Musculoskeletal: Negative for myalgias.  Neurological: Positive for headaches.       Objective:   Physical Exam  There were no vitals filed for this visit.  Gen: Pleasant, well-nourished, in no distress,  normal affect  ENT: No lesions,  mouth clear,  oropharynx clear, no postnasal drip  Neck: No JVD, no TMG, no carotid bruits  Lungs: No use of accessory muscles, no dullness to percussion, expired wheezes  Cardiovascular: RRR, heart sounds normal, no murmur or gallops, no peripheral edema  Abdomen: soft and NT, no HSM,  BS normal  Musculoskeletal: No deformities, no cyanosis or clubbing  Neuro: alert, non focal  Skin: Warm, no lesions or rashes  No results found.  No recent CXR or pfts     Assessment & Plan:  I personally reviewed all images and lab data in the [facility] system as well as any outside material available during this office visit and agree with the  radiology impressions.   No problem-specific Assessment & Plan notes found for this encounter.   There are no diagnoses linked to this encounter.

## 2018-03-27 ENCOUNTER — Telehealth: Payer: Self-pay | Admitting: *Deleted

## 2018-03-27 NOTE — Telephone Encounter (Signed)
Patient verified DOB Patient had transportation concerns and recently loss her job. Patient had [employer] and [employer] discount in 2019 and was advised to reapply here. Patient was encouraged to continue seeing her PCP here and to allow [place] to assist in in between employment needs. Patient would return a phone to reschedule.

## 2018-05-16 NOTE — Telephone Encounter (Signed)
done

## 2018-05-26 ENCOUNTER — Ambulatory Visit (HOSPITAL_COMMUNITY)
Admission: EM | Admit: 2018-05-26 | Discharge: 2018-05-26 | Disposition: A | Discharge status: Home / Self Care | Payer: No Typology Code available for payment source | Attending: Family Medicine | Admitting: Family Medicine

## 2018-05-26 ENCOUNTER — Encounter (HOSPITAL_COMMUNITY): Payer: Self-pay

## 2018-05-26 ENCOUNTER — Other Ambulatory Visit: Payer: Self-pay

## 2018-05-26 ENCOUNTER — Telehealth: Payer: No Typology Code available for payment source | Admitting: Physician Assistant

## 2018-05-26 DIAGNOSIS — H60502 Unspecified acute noninfective otitis externa, left ear: Secondary | ICD-10-CM | POA: Diagnosis not present

## 2018-05-26 DIAGNOSIS — H9209 Otalgia, unspecified ear: Secondary | ICD-10-CM

## 2018-05-26 MED ORDER — DOXYCYCLINE HYCLATE 100 MG PO CAPS: 100.0000 mg | ORAL_CAPSULE | Freq: Two times a day (BID) | ORAL | 0 refills | Status: DC

## 2018-05-26 MED ORDER — NEOMYCIN-POLYMYXIN-HC 3.5-10000-1 OT SUSP: 4.0000 [drp] | Freq: Three times a day (TID) | OTIC | 0 refills | Status: DC

## 2018-05-26 NOTE — Progress Notes (Signed)
Based on what you shared with me, I feel your condition warrants further evaluation and I recommend that you be seen for a face to face office visit. It is hard to know what the problem is with you ear without a good exam.  It could be buildup of earwax, an ear infection on the inside, or an infection on the outside.  Each of these would be treated differently.  Please proceed to one of the locations below.  You will not be charged for this visit.      NOTE: If you entered your credit card information for this eVisit, you will not be charged. You may see a "hold" on your card for the $35 but that hold will drop off and you will not have a charge processed.  If you are having a true medical emergency please call 911.  If you need an urgent face to face visit, [facility] has four urgent care centers for your convenience.    PLEASE NOTE: THE [facility] LOCATIONS AND URGENT CARE CLINICS DO NOT HAVE THE TESTING FOR CORONAVIRUS COVID19 AVAILABLE.  IF YOU FEEL YOU NEED THIS TEST YOU MUST GO TO A TRIAGE LOCATION AT ONE OF THE HOSPITAL EMERGENCY DEPARTMENTS ?  [email or web address] to reserve your spot online an avoid wait times  [facility] [address], [address] [place], [place] [address] Modified hours of operation: Monday-Friday, 10 AM to 6 PM  Saturday & Sunday 10 AM to 4 PM *Across the street from [employer]  [employer] (New Address!) [address], [address] [place], [place] [address] *Just off [address] across the road from [place]* Modified hours of operation: Monday-Friday, 10 AM to 5 PM  Closed Saturday & Sunday   The following sites will take your insurance:  [facility]  [phone] Get Driving Directions Find a Provider at this Location  [address] [place], [place] [address] 10 am to 8 pm Monday-Friday 12 pm to 8 pm [facility]  [phone] Get Driving Directions Find a Provider at this Location  [address] [place], [place] [address] 8 am to 8 pm Monday-Friday 9 am to 6 pm Saturday 11 am to 6 pm Sunday   [facility] at [facility]  [phone] Get Driving Directions  [address] [place], [place] [address] 8 am to 8 pm Monday-Friday 8 am to 4 pm Saturday-Sunday   Your e-visit answers were reviewed by a board certified advanced clinical practitioner to complete your personal care plan.  Thank you for using e-Visits.  ===View-only below this line===   ----- Message -----    From: [name]    Sent: 05/26/2018  8:14 AM EDT      To: E-Visit Mailing List Subject: E-Visit Submission: [facility]  E-Visit Submission: [facility] --------------------------------  Question: How long have you experienced these symptoms? Answer:   For 1-2 days  Question: Which ear is causing you discomfort? Answer:   Left  Question: Have you experienced drainage from your ear? Answer:   No  Question: Do you experience pain when touching or wiggling your earlobe? Answer:   Yes  Question: If yes you have pain, please rate pain on a scale of 1-10 (range: 1 - 10) Answer:   6  Question: Are your glands swollen in your neck? Answer:   Yes  Question: Does your ear feel plugged or any hearing  loss? (Check all that apply) Answer:   My ear feels plugged and I have some hearing loss  Question: Do you have a fever? Answer:   No  Question: Any pain when you blow your nose? Answer:   No  Question: Have you tried any over the counter ear drops? Answer:   Yes  Question: If yes, you have tried over the counter ear drops please list below: Answer:   [facility] ear ache relief drops  Question: Have the over the counter medications helped your symptoms? Answer:   Partially  Question: Please list your medication allergies that you may have ? (If 'none' ,  please list as 'none') Answer:   Penicillin, IV Contrast Dye, and mucinex  Question: Please list any additional comments  Answer:   I am unsure of it is from allergies or trapped water (from showering). It started with intense itching.            A little bit of clear liquid that looks like water has came out.  Question: Are you pregnant? Answer:   I am confident that I am not pregnant  Question: Are you breastfeeding? Answer:   No

## 2018-05-26 NOTE — Discharge Instructions (Signed)
Rest and drink plenty of fluids Prescribed doxycycline.  Take as directed and to completion Cortisporin ear drops prescribed.  Use as directed and to completion Continue to use OTC ibuprofen and/ or tylenol as needed for pain control Follow up with PCP if symptoms persists Return here or go to the ER if you have any new or worsening symptoms fever, chills, nausea, vomiting, worsening ear pain despite treatment, redness, swelling, changes in hearing, discharge from ear, etc..Marland Kitchen

## 2018-05-26 NOTE — ED Provider Notes (Signed)
[place]   [id number] 05/26/18 Arrival Time: 1924  CC:EAR PAIN  SUBJECTIVE: History from: patient.  Kristen [name] is a 33 y.o. female hx significant for anxiety, asthma, congenital third kidney, depression, migraines, PVCs, and hx of pancreatitis, who presents with left ear pain x 3 days.  Denies a precipitating event, such as swimming or wearing ear plugs.  Patient states the pain is constant.  Patient has tried OTC medications with minimal relief.  Reports similar symptoms in the past and diagnosed with ear infection.    Denies fever, chills, fatigue, sinus pain, rhinorrhea, ear discharge, sore throat, SOB, wheezing, chest pain, nausea, changes in bowel or bladder habits.    ROS: As per HPI.  Past Medical History:  Diagnosis Date  . Anxiety 2013   treated at [facility] by Kristen [name]   . Asthma 1989    no hospitalization, or intubation, previously on advair and singulair. asthma worsening.   . 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 Depression 2001   depressed since childhood   . 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 Kyphosis of thoracic region 2004    painful, runs in dad side   . Migraine   . Pancreatitis 2003   gallstone pancreatitis   . Pancreatitis due to biliary obstruction 2003  . PCOS (polycystic ovarian syndrome) 2014   facial hair, irregular periods, never been pregnant    Past Surgical History:  Procedure Laterality Date  . CHOLECYSTECTOMY  2003   . OPEN REDUCTION INTERNAL FIXATION (ORIF) PROXIMAL PHALANX Right 07/23/2013   Procedure: OPEN TREATMENT RIGHT RING FINGER, PROXIMAL INTERPHALANGEAL/JOINT FRACTURE DISLOCATION;  Surgeon: Jolyn Nap, MD;  Location: [place];  Service: Orthopedics;  Laterality: Right;  . TONSILLECTOMY     Allergies  Allergen Reactions  . Mucinex [Guaifenesin Er] Hives, Itching and Swelling  . Penicillins Swelling  . Contrast Media [Iodinated Diagnostic Agents] Nausea And Vomiting  .  Multihance [Gadobenate] Nausea And Vomiting   No current facility-administered medications on file prior to encounter.    Current Outpatient Medications on File Prior to Encounter  Medication Sig Dispense Refill  . albuterol (PROVENTIL HFA;VENTOLIN HFA) 108 (90 Base) MCG/ACT inhaler Inhale 2 puffs every 6 (six) hours as needed into the lungs for wheezing or shortness of breath. 6.7 g 3  . albuterol (PROVENTIL) (2.5 MG/3ML) 0.083% nebulizer solution Take 3 mLs (2.5 mg total) every 6 (six) hours as needed by nebulization for wheezing or shortness of breath. 50 vial 5  . budesonide-formoterol (SYMBICORT) 160-4.5 MCG/ACT inhaler Inhale 2 puffs 2 (two) times daily into the lungs. 1 Inhaler 12  . metFORMIN (GLUCOPHAGE) 1000 MG tablet Take one tablet twice a day. (Patient taking differently: Take 500 mg by mouth 2 (two) times daily with a meal. Take one tablet twice a day.) 60 tablet 5  . montelukast (SINGULAIR) 10 MG tablet Take 1 tablet (10 mg total) by mouth daily. 30 tablet 2  . Multiple Vitamin (MULTIVITAMIN) tablet Take 1 tablet by mouth daily.    . naproxen (NAPROSYN) 500 MG tablet TAKE 1 TABLET BY MOUTH 2 TIMES DAILY WITH A MEAL. 30 tablet 0   Social History   Socioeconomic History  . Marital status: Single    Spouse name: Not on file  . Number of children: 0  . Years of education: college   . Highest education level: Not on file  Occupational History  . Occupation: Unemployed  Social Needs  . Financial resource strain: Not on file  . Food insecurity:    Worry: Not on file    Inability: Not on file  . Transportation needs:    Medical: Not on file    Non-medical: Not on file  Tobacco Use  . Smoking status: Never Smoker  . Smokeless tobacco: Never Used  Substance and Sexual Activity  . Alcohol use: No  . Drug use: No  . Sexual activity: Yes    Birth control/protection: None  Lifestyle  . Physical activity:    Days per week: Not on file    Minutes per session: Not on file   . Stress: Not on file  Relationships  . Social connections:    Talks on phone: Not on file    Gets together: Not on file    Attends religious service: Not on file    Active member of club or organization: Not on file    Attends meetings of clubs or organizations: Not on file    Relationship status: Not on file  . Intimate partner violence:    Fear of current or ex partner: Not on file    Emotionally abused: Not on file    Physically abused: Not on file    Forced sexual activity: Not on file  Other Topics Concern  . Not on file  Social History Narrative   Lives with mom Kristen [name])    Right-handed   Caffeine: 3 cups of coffee per day   Family History  Problem Relation Age of Onset  . Hypertension Mother   . Arnold-Chiari malformation Mother   . Restless legs syndrome Mother   . Osteoporosis Mother   . COPD Mother   . Asthma Mother   . Squamous cell carcinoma Mother        skin   . Eczema Mother   . Arthritis Mother   . Peripheral Artery Disease Mother   . Hypertension Father   . Scoliosis Father   . Bipolar disorder Father   . Congestive Heart Failure Father   . COPD Father   . Diabetes Maternal Grandmother   . Hypertension Maternal Grandmother   . Cancer Paternal Grandmother        colon  . Bone cancer Maternal Grandfather 82       bone marrow   . Multiple myeloma Maternal Grandfather   . Diabetes Paternal Grandfather     OBJECTIVE:  Vitals:   05/26/18 1954  BP: (!) 148/90  Pulse: 98  Resp: 18  Temp: 98.3 F (36.8 C)  SpO2: 100%     General appearance: alert; appears mildly fatigued, but nontoxic HEENT: Ears: RT EAC clear, TM pearly gray with visible cone of light, without erythema, LT EAC swollen, unable to visualize TM, NTTP over mastoid without erythema; Eyes: PERRL, EOMI grossly; Nose: patent without rhinorrhea, mild crusting; Throat: oropharynx clear, tonsils not enlarged or erythematous, uvula midline Neck: supple without LAD Lungs: unlabored  respirations, symmetrical air entry; cough: absent; no respiratory distress Heart: regular rate and rhythm.  Radial pulses 2+ symmetrical bilaterally Skin: warm and dry Psychological: alert and cooperative; normal mood and affect  ASSESSMENT & PLAN:  1. Acute otitis externa of left ear, unspecified type     Meds ordered this encounter  Medications  . doxycycline (VIBRAMYCIN) 100 MG capsule    Sig: Take 1 capsule (100 mg total) by mouth 2 (two) times daily.    Dispense:  20 capsule    Refill:  0  Order Specific Question:   Supervising Provider    Answer:   Raylene Everts [[id number]]  . neomycin-polymyxin-hydrocortisone (CORTISPORIN) 3.5-10000-1 OTIC suspension    Sig: Place 4 drops into the left ear 3 (three) times daily for 10 days.    Dispense:  10 mL    Refill:  0    Order Specific Question:   Supervising Provider    Answer:   Raylene Everts [[id number]]    Rest and drink plenty of fluids Prescribed doxycycline.  Take as directed and to completion Cortisporin ear drops prescribed.  Use as directed and to completion Continue to use OTC ibuprofen and/ or tylenol as needed for pain control Follow up with PCP if symptoms persists Return here or go to the ER if you have any new or worsening symptoms fever, chills, nausea, vomiting, worsening ear pain despite treatment, redness, swelling, changes in hearing, discharge from ear, etc...  Reviewed expectations re: course of current medical issues. Questions answered. Outlined signs and symptoms indicating need for more acute intervention. Patient verbalized understanding. After Visit Summary given.         Lestine Box, PA-C 05/26/18 2039

## 2018-05-28 ENCOUNTER — Other Ambulatory Visit: Payer: Self-pay

## 2018-05-28 ENCOUNTER — Ambulatory Visit: Payer: No Typology Code available for payment source | Attending: Family Medicine | Admitting: Physician Assistant

## 2018-05-28 ENCOUNTER — Inpatient Hospital Stay: Payer: No Typology Code available for payment source

## 2018-05-28 DIAGNOSIS — H6092 Unspecified otitis externa, left ear: Secondary | ICD-10-CM

## 2018-05-28 MED ORDER — CIPROFLOXACIN-DEXAMETHASONE 0.3-0.1 % OT SUSP: 4.0000 [drp] | Freq: Two times a day (BID) | OTIC | 0 refills | Status: DC

## 2018-05-28 NOTE — Progress Notes (Signed)
Patient ID: Kristen [name], female   DOB: [birth year], 33 y.o.   MRN: [id number]      Virtual Visit via Video Note  I connected with Kristen [name] on 05/28/18 at  1:30 PM EDT by a video enabled telemedicine application and verified that I am speaking with the correct person using two identifiers.   I discussed the limitations of evaluation and management by telemedicine and the availability of in person appointments. The patient expressed understanding and agreed to proceed.  History of Present Illness:  After being seen in the UC for L ear pain and pressure 2 days ago.  She was prescribed Doxycycline and Neosporin/polysporin.  Pain not improving.  Not worse, but no better.  Feels as though drops may not be going down into the ear canal.  Tolerating Doxy ok.  No fever.  H/o "swimmer's ear" with need to be wicked in years past.      Observations/Objective:  NAD.  [place]/AT.  No redness of external ear or side of face.  Skin is intact and does not appear irritated.     Assessment and Plan: 1. Otitis externa of left ear, unspecified chronicity, unspecified type Continue Doxy.  Stop neo/poly drops-start- - ciprofloxacin-dexamethasone (CIPRODEX) OTIC suspension; Place 4 drops into the left ear 2 (two) times daily.  Dispense: 7.5 mL; Refill: 0 -tylenol or advil for pain  Follow Up Instructions: If worsens go to UC or ED-may need [place].     I discussed the assessment and treatment plan with the patient. The patient was provided an opportunity to ask questions and all were answered. The patient agreed with the plan and demonstrated an understanding of the instructions.   The patient was advised to call back or seek an in-person evaluation if the symptoms worsen or if the condition fails to improve as anticipated.  I provided 12 minutes of non-face-to-face time during this encounter.   Georgian Co, PA-C

## 2018-09-17 ENCOUNTER — Ambulatory Visit (INDEPENDENT_AMBULATORY_CARE_PROVIDER_SITE_OTHER): Payer: No Typology Code available for payment source

## 2018-09-17 ENCOUNTER — Telehealth (INDEPENDENT_AMBULATORY_CARE_PROVIDER_SITE_OTHER): Payer: No Typology Code available for payment source | Admitting: Lactation Services

## 2018-09-17 ENCOUNTER — Other Ambulatory Visit: Payer: Self-pay

## 2018-09-17 DIAGNOSIS — Z3202 Encounter for pregnancy test, result negative: Secondary | ICD-10-CM | POA: Diagnosis not present

## 2018-09-17 DIAGNOSIS — Z3200 Encounter for pregnancy test, result unknown: Secondary | ICD-10-CM

## 2018-09-17 LAB — POCT PREGNANCY, URINE: Preg Test, Ur: NEGATIVE

## 2018-09-17 NOTE — Progress Notes (Signed)
Patient seen and assessed by nursing staff.  Agree with documentation and plan.  

## 2018-09-17 NOTE — Telephone Encounter (Signed)
Spoke with Dr. Kennon Rounds in regards to what pt indicated through messages. She would like pt to come in today for a Pregnancy test. If Pregnancy test + will follow up as needed, if negative will come to the appt previously scheduled for Monday 8/17.   Pt reports she is still bleeding like a period and was much worse yesterday. Pt reports she has been trying to get pregnant. Pt reports she is feeling weak today and some brain fog. She reports she is unsure if she was pregnant and has had irregular periods but has never experienced what she did yesterday. Pt to come in today at 10:40 for Pregnancy test and to follow up with Dr. Kennon Rounds as needed.

## 2018-09-17 NOTE — Progress Notes (Signed)
Pt here today for pregnancy test due to request of Dr. Kennon Rounds.  Pregnancy test resulted negative.  Pt informed of negative pregnancy test and that she will f/u at her appt scheduled on 09/21/18.  Pt verbalized understanding.  Mel Almond, RN 09/17/18

## 2018-09-21 ENCOUNTER — Encounter: Payer: Self-pay | Admitting: Obstetrics & Gynecology

## 2018-09-21 ENCOUNTER — Other Ambulatory Visit: Payer: Self-pay

## 2018-09-21 ENCOUNTER — Ambulatory Visit (INDEPENDENT_AMBULATORY_CARE_PROVIDER_SITE_OTHER): Payer: No Typology Code available for payment source | Admitting: Obstetrics & Gynecology

## 2018-09-21 VITALS — BP 138/85 | HR 61 | Temp 97.7°F | Wt 315.0 lb

## 2018-09-21 DIAGNOSIS — R102 Pelvic and perineal pain: Secondary | ICD-10-CM

## 2018-09-21 DIAGNOSIS — G8929 Other chronic pain: Secondary | ICD-10-CM | POA: Diagnosis not present

## 2018-09-21 DIAGNOSIS — E282 Polycystic ovarian syndrome: Secondary | ICD-10-CM | POA: Diagnosis not present

## 2018-09-21 DIAGNOSIS — N939 Abnormal uterine and vaginal bleeding, unspecified: Secondary | ICD-10-CM | POA: Diagnosis not present

## 2018-09-21 MED ORDER — NAPROXEN 500 MG PO TABS: 500.0000 mg | ORAL_TABLET | Freq: Two times a day (BID) | ORAL | 2 refills | Status: DC

## 2018-09-21 MED ORDER — METFORMIN HCL 500 MG PO TABS: 500.0000 mg | ORAL_TABLET | Freq: Two times a day (BID) | ORAL | 1 refills | Status: DC

## 2018-09-21 NOTE — Progress Notes (Signed)
GYNECOLOGY OFFICE VISIT NOTE  History:   Kristen [name] is a 33 y.o. G0P0 here today for evaluation of heavy periods and associated pain recently.  Has PCOS, was treated with Metformin in the past and had a course of Femara in 2018.  She was lost to follow up due to insurance issues.  Today, she is worried that she may have passed some pregnancy tissue during last bleeding episode but had a negative UPT. Wants to restart Metformin and wants evaluation for possible endometriosis; has dysmenorrhea and pain in lower pelvis occasionally.  She denies any current abnormal vaginal discharge, bleeding, pelvic pain or other concerns.    Past Medical History:  Diagnosis Date  . Anxiety 2013   treated at [facility] by Pauline Good   . Asthma 1989    no hospitalization, or intubation, previously on advair and singulair. asthma worsening.   . 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 Depression 2001   depressed since childhood   . 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 Kyphosis of thoracic region 2004    painful, runs in dad side   . Migraine   . Pancreatitis 2003   gallstone pancreatitis   . Pancreatitis due to biliary obstruction 2003  . PCOS (polycystic ovarian syndrome) 2014   facial hair, irregular periods, never been pregnant     Past Surgical History:  Procedure Laterality Date  . CHOLECYSTECTOMY  2003   . OPEN REDUCTION INTERNAL FIXATION (ORIF) PROXIMAL PHALANX Right 07/23/2013   Procedure: OPEN TREATMENT RIGHT RING FINGER, PROXIMAL INTERPHALANGEAL/JOINT FRACTURE DISLOCATION;  Surgeon: Jolyn Nap, MD;  Location: [place];  Service: Orthopedics;  Laterality: Right;  . TONSILLECTOMY      The following portions of the patient's history were reviewed and updated as appropriate: allergies, current medications, past family history, past medical history, past social history, past surgical history and problem list.   Health Maintenance:  Normal pap on  03/25/2014.  Review of Systems:  Pertinent items noted in HPI and remainder of comprehensive ROS otherwise negative.  Physical Exam:  BP 138/85   Pulse 61   Temp 97.7 F (36.5 C)   Wt (!) 315 lb (142.9 kg)   LMP 09/18/2018   BMI 50.08 kg/m  CONSTITUTIONAL: Well-developed, well-nourished female in no acute distress.  HEENT:  Normocephalic, atraumatic. External right and left ear normal. No scleral icterus.  NECK: Normal range of motion, supple, no masses noted on observation SKIN: No rash noted. Not diaphoretic. No erythema. No pallor. MUSCULOSKELETAL: Normal range of motion. No edema noted. NEUROLOGIC: Alert and oriented to person, place, and time. Normal muscle tone coordination. No cranial nerve deficit noted. PSYCHIATRIC: Normal mood and affect. Normal behavior. Normal judgment and thought content. CARDIOVASCULAR: Normal heart rate noted RESPIRATORY: Effort and breath sounds normal, no problems with respiration noted ABDOMEN: No masses noted. No other overt distention noted.   PELVIC: Deferred  Labs and Imaging Results for orders placed or performed in visit on 09/17/18 (from the past 168 hour(s))  Pregnancy, urine POC   Collection Time: 09/17/18 11:00 AM  Result Value Ref Range   Preg Test, Ur NEGATIVE NEGATIVE   No results found.    Assessment and Plan:    1. PCOS (polycystic ovarian syndrome) Restarted on Metformin. Plan to increase to 1500-2000 mg a day for adequate PCOS treatment. Weight loss recommended to help with re-establishing ovulatory cycles..  - metFORMIN (GLUCOPHAGE) 500 MG tablet;  Take 1 tablet (500 mg total) by mouth 2 (two) times daily with a meal.  Dispense: 60 tablet; Refill: 1  2. Abnormal uterine bleeding (AUB) 3. Chronic female pelvic pain Will evaluate for other etiologies for bleeding, Naproxen given to help with pain and AUB. If pain worsens, may consider laparoscopy.   - US PELVIC COMPLETE WITH TRANSVAGINAL; Future - naproxen (NAPROSYN) 500 MG  tablet; Take 1 tablet (500 mg total) by mouth 2 (two) times daily with a meal. As needed for pain  Dispense: 30 tablet; Refill: 2  Routine preventative health maintenance measures emphasized. Please refer to After Visit Summary for other counseling recommendations.   Return in 4 weeks (on 10/19/2018) for Annual exam and Pap smear; follow up PCOS.    Total face-to-face time with patient: 25 minutes.  Over 50% of encounter was spent on counseling and coordination of care.   Jaynie CollinsUGONNA  Sanita Estrada, MD, FACOG Obstetrician & Gynecologist, [facility], [facility]

## 2018-09-21 NOTE — Patient Instructions (Addendum)
Return to clinic for any scheduled appointments or for any gynecologic concerns as needed.   

## 2018-09-30 ENCOUNTER — Ambulatory Visit (HOSPITAL_COMMUNITY): Admission: RE | Admit: 2018-09-30 | Payer: No Typology Code available for payment source | Source: Ambulatory Visit

## 2018-10-08 ENCOUNTER — Ambulatory Visit (INDEPENDENT_AMBULATORY_CARE_PROVIDER_SITE_OTHER): Payer: No Typology Code available for payment source | Admitting: Family Medicine

## 2018-10-08 ENCOUNTER — Encounter: Payer: Self-pay | Admitting: Family Medicine

## 2018-10-08 ENCOUNTER — Other Ambulatory Visit: Payer: Self-pay

## 2018-10-08 ENCOUNTER — Other Ambulatory Visit: Payer: Self-pay | Admitting: *Deleted

## 2018-10-08 VITALS — BP 146/98 | HR 80 | Ht 66.5 in | Wt 320.0 lb

## 2018-10-08 DIAGNOSIS — G43009 Migraine without aura, not intractable, without status migrainosus: Secondary | ICD-10-CM

## 2018-10-08 DIAGNOSIS — G4733 Obstructive sleep apnea (adult) (pediatric): Secondary | ICD-10-CM

## 2018-10-08 DIAGNOSIS — R030 Elevated blood-pressure reading, without diagnosis of hypertension: Secondary | ICD-10-CM | POA: Diagnosis not present

## 2018-10-08 MED ORDER — PROPRANOLOL HCL 10 MG PO TABS: 10.0000 mg | ORAL_TABLET | Freq: Two times a day (BID) | ORAL | 5 refills | Status: DC

## 2018-10-08 MED ORDER — NAPROXEN 500 MG PO TABS: 500.0000 mg | ORAL_TABLET | Freq: Two times a day (BID) | ORAL | 2 refills | Status: DC

## 2018-10-08 NOTE — Progress Notes (Signed)
PATIENT: Kristen [name] DOB: 04-20-1985  REASON FOR VISIT: follow up HISTORY FROM: patient  Chief Complaint  Patient presents with   New Patient (Initial Visit)    RM 1, alone. Migraines are worsening. 4/month. Associated nausea, light and sound sensitivity.     HISTORY OF PRESENT ILLNESS: Today 10/08/18 Kristen [name] is a 33 y.o. female here today with concerns of worsening headaches. She reports that over the past few months she has started having headaches 10-15 days of the month. 4-5 of these are migrainous with nausea, light and sound sensitivity. Headaches are typically a tension/pressure type pain. Migraines are usually unilateral behind the eye or in the back of her head. She does have blurred vision with headache. She has taken Aleve or ibuprofen on occasion for migraine abortion. She does have sleep apnea on CPAP. She admits that she has not used CPAP in several months. She had a lapse in insurance and was not able to get supplies. She does have a history of hypertension. She was on medication in the past but contributed her elevated readings to stress and was taken off medication. She does not check BP at home. She has not seen PCP recently. She is hoping to conceive in the near future.   HISTORY: (copied from [place] note on 07/22/2017)  Kristen [name] is a 33 year old female with a history of migraine headaches and obstructive sleep apnea.  She returns today for follow-up.  She has a new complaint today of memory loss.  She states that this has occurred in the last 2 to 3 weeks.  She reports that she forgets things that she is immediately told.  She reports that if she does not write it down she will not remember.  She reports that she can be in the middle of conversation and will forget what words she is searching for.  She lives at home with her mother.  Reports that she manages her own finances.  Able to prepare her own meals.  Able to complete all ADLs independently.   No trouble operating a motor vehicle.  She reports that she does get stressed very easily.  Reports that she has had anxiety since she was a child.  She reports that her headaches are manageable.  She states that she has approximate 1 migraine a week.  She reports a daily dull headache.  She reports that the length of these headaches vary.  She is currently trying to conceive therefore she is not on any medication for her headaches.  Patient also reports staring events.  She reports that these have been witnessed by her mother and significant other.  Her mother reports that she will be staring right at her but will not respond.  The patient reports that she uses a CPAP.  Her CPAP download indicates that she use her machine 21 out of 30 days for compliance of 70%.  She use her machine greater than 4 hours 14 out of 30 days for compliance of 47%.  This indicates suboptimal compliance.  On average she uses the machine 5 hours and 24 minutes.  She is on AutoSet 4 to 10 cm of water with EPR 3.  Her residual AHI is 1.1.  She does not have a significant leak. She returns today for an evaluation.  HISTORY 01/21/17 Kristen [name] is a 33 year old female with a history of migraine headaches and obstructive sleep apnea. She returns today for an evaluation. She reports that she stopped Topamax  because she is interested in getting pregnant. She reports that her headache frequency has actually decreased. Reports that she was having approximately 3 headaches a week and now she is only having one headache a week. She states that when she does get a headache it typically occurs in the center of the forehead. She does report photophobia but no phonophobia. She denies nausea and vomiting. She denies any significant changes with her vision. Reports on occasion she will have some blurred or double vision however she states that this is usually brief. She states that she typically can take naproxen and lay down and her  headache will resolve in approximately 2 hours. The patient was diagnosed with mild to moderate sleep apnea. CPAP therapy was suggested. However the patient has not yet started using the CPAP. She returns today for an evaluation.   REVIEW OF SYSTEMS: Out of a complete 14 system review of symptoms, the patient complains only of the following symptoms, headaches and all other reviewed systems are negative.   ALLERGIES: Allergies  Allergen Reactions   Mucinex [Guaifenesin Er] Hives, Itching and Swelling   Penicillins Swelling   Contrast Media [Iodinated Diagnostic Agents] Nausea And Vomiting   Multihance [Gadobenate] Nausea And Vomiting    HOME MEDICATIONS: Outpatient Medications Prior to Visit  Medication Sig Dispense Refill   albuterol (PROVENTIL HFA;VENTOLIN HFA) 108 (90 Base) MCG/ACT inhaler Inhale 2 puffs every 6 (six) hours as needed into the lungs for wheezing or shortness of breath. 6.7 g 3   albuterol (PROVENTIL) (2.5 MG/3ML) 0.083% nebulizer solution Take 3 mLs (2.5 mg total) every 6 (six) hours as needed by nebulization for wheezing or shortness of breath. 50 vial 5   metFORMIN (GLUCOPHAGE) 500 MG tablet Take 1 tablet (500 mg total) by mouth 2 (two) times daily with a meal. 60 tablet 1   naproxen (NAPROSYN) 500 MG tablet Take 1 tablet (500 mg total) by mouth 2 (two) times daily with a meal. As needed for pain 30 tablet 2   budesonide-formoterol (SYMBICORT) 160-4.5 MCG/ACT inhaler Inhale 2 puffs 2 (two) times daily into the lungs. 1 Inhaler 12   montelukast (SINGULAIR) 10 MG tablet Take 1 tablet (10 mg total) by mouth daily. 30 tablet 2   Multiple Vitamin (MULTIVITAMIN) tablet Take 1 tablet by mouth daily.     No facility-administered medications prior to visit.     PAST MEDICAL HISTORY: Past Medical History:  Diagnosis Date   Anxiety 2013   treated at [facility] by [place]    no hospitalization, or intubation, previously on advair and  singulair. asthma worsening.    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 , dx in utero    Depression 2001   depressed since childhood    Dysrhythmia 2010   PVC's   Kyphosis of thoracic region 2004    painful, runs in dad side    Migraine    Pancreatitis 2003   gallstone pancreatitis    Pancreatitis due to biliary obstruction 2003   PCOS (polycystic ovarian syndrome) 2014   facial hair, irregular periods, never been pregnant     PAST SURGICAL HISTORY: Past Surgical History:  Procedure Laterality Date   CHOLECYSTECTOMY  2003    OPEN REDUCTION INTERNAL FIXATION (ORIF) PROXIMAL PHALANX Right 07/23/2013   Procedure: OPEN TREATMENT RIGHT RING FINGER, PROXIMAL INTERPHALANGEAL/JOINT FRACTURE DISLOCATION;  Surgeon: Jolyn Nap, MD;  Location: [facility];  Service: Orthopedics;  Laterality: Right;  TONSILLECTOMY      FAMILY HISTORY: Family History  Problem Relation Age of Onset   Hypertension Mother    Arnold-Chiari malformation Mother    Restless legs syndrome Mother    Osteoporosis Mother    COPD Mother    Asthma Mother    Squamous cell carcinoma Mother        skin    Eczema Mother    Arthritis Mother    Peripheral Artery Disease Mother    Hypertension Father    Scoliosis Father    Bipolar disorder Father    Congestive Heart Failure Father    COPD Father    Diabetes Maternal Grandmother    Hypertension Maternal Grandmother    Cancer Paternal Grandmother        colon   Bone cancer Maternal Grandfather 82       bone marrow    Multiple myeloma Maternal Grandfather    Diabetes Paternal Grandfather     SOCIAL HISTORY: Social History   Socioeconomic History   Marital status: Single    Spouse name: Not on file   Number of children: 0   Years of education: college    Highest education level: Not on file  Occupational History   Occupation: Unemployed   Scientist, product/process development strain: Not on file     Food insecurity    Worry: Not on file    Inability: Not on file   Transportation needs    Medical: Not on file    Non-medical: Not on file  Tobacco Use   Smoking status: Never Smoker   Smokeless tobacco: Never Used  Substance and Sexual Activity   Alcohol use: No   Drug use: No   Sexual activity: Yes    Birth control/protection: None  Lifestyle   Physical activity    Days per week: Not on file    Minutes per session: Not on file   Stress: Not on file  Relationships   Social connections    Talks on phone: Not on file    Gets together: Not on file    Attends religious service: Not on file    Active member of club or organization: Not on file    Attends meetings of clubs or organizations: Not on file    Relationship status: Not on file   Intimate partner violence    Fear of current or ex partner: Not on file    Emotionally abused: Not on file    Physically abused: Not on file    Forced sexual activity: Not on file  Other Topics Concern   Not on file  Social History Narrative   Lives with mom [name])    Right-handed   Caffeine: 3 cups of coffee per day     PHYSICAL EXAM  Vitals:   10/08/18 0736  BP: (!) 146/98  Pulse: 80  Weight: (!) 320 lb (145.2 kg)  Height: 5' 6.5" (1.689 m)   Body mass index is 50.88 kg/m.  Generalized: Well developed, in no acute distress  Cardiology: normal rate and rhythm, no murmur noted Neurological examination  Mentation: Alert oriented to time, place, history taking. Follows all commands speech and language fluent Cranial nerve II-XII: Pupils were equal round reactive to light. Extraocular movements were full, visual field were full on confrontational test. Facial sensation and strength were normal. Uvula tongue midline. Head turning and shoulder shrug  were normal and symmetric. Motor: The motor testing reveals 5 over 5 strength of all  4 extremities. Good symmetric motor tone is noted throughout.  Sensory: Sensory  testing is intact to soft touch on all 4 extremities. No evidence of extinction is noted.  Coordination: Cerebellar testing reveals good finger-nose-finger and heel-to-shin bilaterally.  Gait and station: Gait is normal. Romberg is negative. No drift is seen.  Reflexes: Deep tendon reflexes are symmetric and normal bilaterally.   DIAGNOSTIC DATA (LABS, IMAGING, TESTING) - I reviewed patient records, labs, notes, testing and imaging myself where available.  No flowsheet data found.   Lab Results  Component Value Date   WBC 10.0 07/22/2017   HGB 13.7 07/22/2017   HCT 41.3 07/22/2017   MCV 88 07/22/2017   PLT 267 07/22/2017      Component Value Date/Time   NA 141 07/22/2017 1339   K 4.2 07/22/2017 1339   CL 102 07/22/2017 1339   CO2 23 07/22/2017 1339   GLUCOSE 91 07/22/2017 1339   GLUCOSE 100 (H) 09/18/2016 1418   BUN 9 07/22/2017 1339   CREATININE 0.92 07/22/2017 1339   CREATININE 1.09 03/25/2014 1730   CALCIUM 9.5 07/22/2017 1339   PROT 6.4 07/22/2017 1339   ALBUMIN 4.2 07/22/2017 1339   AST 16 07/22/2017 1339   ALT 21 07/22/2017 1339   ALKPHOS 118 (H) 07/22/2017 1339   BILITOT 0.4 07/22/2017 1339   GFRNONAA 84 07/22/2017 1339   GFRNONAA 70 03/25/2014 1730   GFRAA 97 07/22/2017 1339   GFRAA 80 03/25/2014 1730   No results found for: CHOL, HDL, LDLCALC, LDLDIRECT, TRIG, CHOLHDL Lab Results  Component Value Date   HGBA1C 5.0 04/09/2016   Lab Results  Component Value Date   GTXMIWOE32 122 07/22/2017   Lab Results  Component Value Date   TSH 2.140 07/22/2017    ASSESSMENT AND PLAN 33 y.o. year old female  has a past medical history of Anxiety (2013), Asthma (1989 ), Congenital third kidney (1989 ), Depression (2001), Dysrhythmia (2010), Kyphosis of thoracic region (2004 ), Migraine, Pancreatitis (2003), Pancreatitis due to biliary obstruction (2003), and PCOS (polycystic ovarian syndrome) (2014). here with     ICD-10-CM   1. Migraine without aura and without  status migrainosus, not intractable  G43.009 naproxen (NAPROSYN) 500 MG tablet  2. OSA (obstructive sleep apnea)  G47.33 For home use only DME continuous positive airway pressure (CPAP)  3. Elevated blood pressure reading in office without diagnosis of hypertension  R03.0     Sharni has had more frequent headaches and migrainous features over the last few months.  Unfortunately there are probably multiple factors contributing to the increase in migraines.  She has been off of CPAP therapy for quite some time due to losing her insurance.  We will resend orders today to replace her applies.  She is also having some elevated blood pressure readings.  I have advised that she keep a close eye on this at home.  She does have a blood pressure cuff she can use.  We have discussed multiple preventative medications and effects on a potential pregnancy.  Per her request, we will start propranolol 10 mg twice daily.  She was educated on possible side effects as well as potential side effects in pregnancy.  We will readdress need to continue medication should she become pregnant.  My hope is to get her back on CPAP therapy which will help with blood pressures and headaches.  She may use naproxen 500 mg as needed for abortive therapy.  I have encouraged adequate hydration and a healthy well-balanced diet.  Regular exercise would also be beneficial.  She will follow-up with me in 3 months, sooner if needed.  She verbalizes understanding and agreement with this plan.    Orders Placed This Encounter  Procedures   For home use only DME continuous positive airway pressure (CPAP)    Supplies please    Order Specific Question:   Length of Need    Answer:   Lifetime    Order Specific Question:   Patient has OSA or probable OSA    Answer:   Yes    Order Specific Question:   Is the patient currently using CPAP in the home    Answer:   Yes    Order Specific Question:   Settings    Answer:   Other see comments     Order Specific Question:   CPAP supplies needed    Answer:   Mask, headgear, cushions, filters, heated tubing and water chamber     Meds ordered this encounter  Medications   propranolol (INDERAL) 10 MG tablet    Sig: Take 1 tablet (10 mg total) by mouth 2 (two) times daily.    Dispense:  60 tablet    Refill:  5    Order Specific Question:   Supervising Provider    Answer:   Melvenia Beam [[id number]]   naproxen (NAPROSYN) 500 MG tablet    Sig: Take 1 tablet (500 mg total) by mouth 2 (two) times daily with a meal. As needed for pain    Dispense:  30 tablet    Refill:  2    Order Specific Question:   Supervising Provider    Answer:   Melvenia Beam [[id number]]      I spent 15 minutes with the patient. 50% of this time was spent counseling and educating patient on plan of care and medications.    Debbora Presto, FNP-C 10/08/2018, 8:24 AM [facility] [address], [place], [place] [address] [phone]

## 2018-10-08 NOTE — Patient Instructions (Signed)
We will start propranolol 10mg  twice daily  Please monitor BP and pulse closely at home, flow sheet provided  Naproxen as needed for migraine  Make sure to drink plenty of water and eat a well balanced diet  Follow up in 3 months, sooner if needed   Hypertension, Adult High blood pressure (hypertension) is when the force of blood pumping through the arteries is too strong. The arteries are the blood vessels that carry blood from the heart throughout the body. Hypertension forces the heart to work harder to pump blood and may cause arteries to become narrow or stiff. Untreated or uncontrolled hypertension can cause a heart attack, heart failure, a stroke, kidney disease, and other problems. A blood pressure reading consists of a higher number over a lower number. Ideally, your blood pressure should be below 120/80. The first ("top") number is called the systolic pressure. It is a measure of the pressure in your arteries as your heart beats. The second ("bottom") number is called the diastolic pressure. It is a measure of the pressure in your arteries as the heart relaxes. What are the causes? The exact cause of this condition is not known. There are some conditions that result in or are related to high blood pressure. What increases the risk? Some risk factors for high blood pressure are under your control. The following factors may make you more likely to develop this condition:  Smoking.  Having type 2 diabetes mellitus, high cholesterol, or both.  Not getting enough exercise or physical activity.  Being overweight.  Having too much fat, sugar, calories, or salt (sodium) in your diet.  Drinking too much alcohol. Some risk factors for high blood pressure may be difficult or impossible to change. Some of these factors include:  Having chronic kidney disease.  Having a family history of high blood pressure.  Age. Risk increases with age.  Race. You may be at higher risk if you  are African American.  Gender. Men are at higher risk than women before age 2. After age 42, women are at higher risk than men.  Having obstructive sleep apnea.  Stress. What are the signs or symptoms? High blood pressure may not cause symptoms. Very high blood pressure (hypertensive crisis) may cause:  Headache.  Anxiety.  Shortness of breath.  Nosebleed.  Nausea and vomiting.  Vision changes.  Severe chest pain.  Seizures. How is this diagnosed? This condition is diagnosed by measuring your blood pressure while you are seated, with your arm resting on a flat surface, your legs uncrossed, and your feet flat on the floor. The cuff of the blood pressure monitor will be placed directly against the skin of your upper arm at the level of your heart. It should be measured at least twice using the same arm. Certain conditions can cause a difference in blood pressure between your right and left arms. Certain factors can cause blood pressure readings to be lower or higher than normal for a short period of time:  When your blood pressure is higher when you are in a health care provider's office than when you are at home, this is called white coat hypertension. Most people with this condition do not need medicines.  When your blood pressure is higher at home than when you are in a health care provider's office, this is called masked hypertension. Most people with this condition may need medicines to control blood pressure. If you have a high blood pressure reading during one visit or you  have normal blood pressure with other risk factors, you may be asked to:  Return on a different day to have your blood pressure checked again.  Monitor your blood pressure at home for 1 week or longer. If you are diagnosed with hypertension, you may have other blood or imaging tests to help your health care provider understand your overall risk for other conditions. How is this treated? This condition  is treated by making healthy lifestyle changes, such as eating healthy foods, exercising more, and reducing your alcohol intake. Your health care provider may prescribe medicine if lifestyle changes are not enough to get your blood pressure under control, and if:  Your systolic blood pressure is above 130.  Your diastolic blood pressure is above 80. Your personal target blood pressure may vary depending on your medical conditions, your age, and other factors. Follow these instructions at home: Eating and drinking   Eat a diet that is high in fiber and potassium, and low in sodium, added sugar, and fat. An example eating plan is called the DASH (Dietary Approaches to Stop Hypertension) diet. To eat this way: ? Eat plenty of fresh fruits and vegetables. Try to fill one half of your plate at each meal with fruits and vegetables. ? Eat whole grains, such as whole-wheat pasta, brown rice, or whole-grain bread. Fill about one fourth of your plate with whole grains. ? Eat or drink low-fat dairy products, such as skim milk or low-fat yogurt. ? Avoid fatty cuts of meat, processed or cured meats, and poultry with skin. Fill about one fourth of your plate with lean proteins, such as fish, chicken without skin, beans, eggs, or tofu. ? Avoid pre-made and processed foods. These tend to be higher in sodium, added sugar, and fat.  Reduce your daily sodium intake. Most people with hypertension should eat less than 1,500 mg of sodium a day.  Do not drink alcohol if: ? Your health care provider tells you not to drink. ? You are pregnant, may be pregnant, or are planning to become pregnant.  If you drink alcohol: ? Limit how much you use to:  0-1 drink a day for women.  0-2 drinks a day for men. ? Be aware of how much alcohol is in your drink. In the [place] one drink equals one 12 oz bottle of beer (355 mL), one 5 oz glass of wine (148 mL), or one 1 oz glass of hard liquor (44 mL). Lifestyle   Work with  your health care provider to maintain a healthy body weight or to lose weight. Ask what an ideal weight is for you.  Get at least 30 minutes of exercise most days of the week. Activities may include walking, swimming, or biking.  Include exercise to strengthen your muscles (resistance exercise), such as Pilates or lifting weights, as part of your weekly exercise routine. Try to do these types of exercises for 30 minutes at least 3 days a week.  Do not use any products that contain nicotine or tobacco, such as cigarettes, e-cigarettes, and chewing tobacco. If you need help quitting, ask your health care provider.  Monitor your blood pressure at home as told by your health care provider.  Keep all follow-up visits as told by your health care provider. This is important. Medicines  Take over-the-counter and prescription medicines only as told by your health care provider. Follow directions carefully. Blood pressure medicines must be taken as prescribed.  Do not skip doses of blood pressure medicine. Doing  this puts you at risk for problems and can make the medicine less effective.  Ask your health care provider about side effects or reactions to medicines that you should watch for. Contact a health care provider if you:  Think you are having a reaction to a medicine you are taking.  Have headaches that keep coming back (recurring).  Feel dizzy.  Have swelling in your ankles.  Have trouble with your vision. Get help right away if you:  Develop a severe headache or confusion.  Have unusual weakness or numbness.  Feel faint.  Have severe pain in your chest or abdomen.  Vomit repeatedly.  Have trouble breathing. Summary  Hypertension is when the force of blood pumping through your arteries is too strong. If this condition is not controlled, it may put you at risk for serious complications.  Your personal target blood pressure may vary depending on your medical conditions, your  age, and other factors. For most people, a normal blood pressure is less than 120/80.  Hypertension is treated with lifestyle changes, medicines, or a combination of both. Lifestyle changes include losing weight, eating a healthy, low-sodium diet, exercising more, and limiting alcohol. This information is not intended to replace advice given to you by your health care provider. Make sure you discuss any questions you have with your health care provider. Document Released: 01/21/2005 Document Revised: 10/01/2017 Document Reviewed: 10/01/2017 Elsevier Patient Education  2020 [employer] Blood Pressure Record Sheet To take your blood pressure, you will need a blood pressure machine. You can buy a blood pressure machine (blood pressure monitor) at your clinic, drug store, or online. When choosing one, consider:  An automatic monitor that has an arm cuff.  A cuff that wraps snugly around your upper arm. You should be able to fit only one finger between your arm and the cuff.  A device that stores blood pressure reading results.  Do not choose a monitor that measures your blood pressure from your wrist or finger. Follow your health care provider's instructions for how to take your blood pressure. To use this form:  Get one reading in the morning (a.m.) before you take any medicines.  Get one reading in the evening (p.m.) before supper.  Take at least 2 readings with each blood pressure check. This makes sure the results are correct. Wait 1-2 minutes between measurements.  Write down the results in the spaces on this form.  Repeat this once a week, or as told by your health care provider.  Make a follow-up appointment with your health care provider to discuss the results. Blood pressure log Date: _______________________  a.m. _____________________(1st reading) _____________________(2nd reading)  p.m. _____________________(1st reading) _____________________(2nd reading) Date:  _______________________  a.m. _____________________(1st reading) _____________________(2nd reading)  p.m. _____________________(1st reading) _____________________(2nd reading) Date: _______________________  a.m. _____________________(1st reading) _____________________(2nd reading)  p.m. _____________________(1st reading) _____________________(2nd reading) Date: _______________________  a.m. _____________________(1st reading) _____________________(2nd reading)  p.m. _____________________(1st reading) _____________________(2nd reading) Date: _______________________  a.m. _____________________(1st reading) _____________________(2nd reading)  p.m. _____________________(1st reading) _____________________(2nd reading) This information is not intended to replace advice given to you by your health care provider. Make sure you discuss any questions you have with your health care provider. Document Released: 10/20/2002 Document Revised: 03/21/2017 Document Reviewed: 01/21/2017 Elsevier Patient Education  2020 [employer] Sleep Apnea Sleep apnea affects breathing during sleep. It causes breathing to stop for a short time or to become shallow. It can also increase the risk of:  Heart attack.  Stroke.  Being very  overweight (obese).  Diabetes.  Heart failure.  Irregular heartbeat. The goal of treatment is to help you breathe normally again. What are the causes? There are three kinds of sleep apnea:  Obstructive sleep apnea. This is caused by a blocked or collapsed airway.  Central sleep apnea. This happens when the brain does not send the right signals to the muscles that control breathing.  Mixed sleep apnea. This is a combination of obstructive and central sleep apnea. The most common cause of this condition is a collapsed or blocked airway. This can happen if:  Your throat muscles are too relaxed.  Your tongue and tonsils are too large.  You are overweight.  Your airway  is too small. What increases the risk?  Being overweight.  Smoking.  Having a small airway.  Being older.  Being female.  Drinking alcohol.  Taking medicines to calm yourself (sedatives or tranquilizers).  Having family members with the condition. What are the signs or symptoms?  Trouble staying asleep.  Being sleepy or tired during the day.  Getting angry a lot.  Loud snoring.  Headaches in the morning.  Not being able to focus your mind (concentrate).  Forgetting things.  Less interest in sex.  Mood swings.  Personality changes.  Feelings of sadness (depression).  Waking up a lot during the night to pee (urinate).  Dry mouth.  Sore throat. How is this diagnosed?  Your medical history.  A physical exam.  A test that is done when you are sleeping (sleep study). The test is most often done in a sleep lab but may also be done at home. How is this treated?   Sleeping on your side.  Using a medicine to get rid of mucus in your nose (decongestant).  Avoiding the use of alcohol, medicines to help you relax, or certain pain medicines (narcotics).  Losing weight, if needed.  Changing your diet.  Not smoking.  Using a machine to open your airway while you sleep, such as: ? An oral appliance. This is a mouthpiece that shifts your lower jaw forward. ? A CPAP device. This device blows air through a mask when you breathe out (exhale). ? An EPAP device. This has valves that you put in each nostril. ? A BPAP device. This device blows air through a mask when you breathe in (inhale) and breathe out.  Having surgery if other treatments do not work. It is important to get treatment for sleep apnea. Without treatment, it can lead to:  High blood pressure.  Coronary artery disease.  In men, not being able to have an erection (impotence).  Reduced thinking ability. Follow these instructions at home: Lifestyle  Make changes that your doctor recommends.   Eat a healthy diet.  Lose weight if needed.  Avoid alcohol, medicines to help you relax, and some pain medicines.  Do not use any products that contain nicotine or tobacco, such as cigarettes, e-cigarettes, and chewing tobacco. If you need help quitting, ask your doctor. General instructions  Take over-the-counter and prescription medicines only as told by your doctor.  If you were given a machine to use while you sleep, use it only as told by your doctor.  If you are having surgery, make sure to tell your doctor you have sleep apnea. You may need to bring your device with you.  Keep all follow-up visits as told by your doctor. This is important. Contact a doctor if:  The machine that you were given to use during  sleep bothers you or does not seem to be working.  You do not get better.  You get worse. Get help right away if:  Your chest hurts.  You have trouble breathing in enough air.  You have an uncomfortable feeling in your back, arms, or stomach.  You have trouble talking.  One side of your body feels weak.  A part of your face is hanging down. These symptoms may be an emergency. Do not wait to see if the symptoms will go away. Get medical help right away. Call your local emergency services (911 in the [place] Do not drive yourself to the hospital. Summary  This condition affects breathing during sleep.  The most common cause is a collapsed or blocked airway.  The goal of treatment is to help you breathe normally while you sleep. This information is not intended to replace advice given to you by your health care provider. Make sure you discuss any questions you have with your health care provider. Document Released: 10/31/2007 Document Revised: 11/07/2017 Document Reviewed: 09/16/2017 Elsevier Patient Education  2020 [employer]. Migraine Headache A migraine headache is a very strong throbbing pain on one side or both sides of your head. This type of headache  can also cause other symptoms. It can last from 4 hours to 3 days. Talk with your doctor about what things may bring on (trigger) this condition. What are the causes? The exact cause of this condition is not known. This condition may be triggered or caused by:  Drinking alcohol.  Smoking.  Taking medicines, such as: ? Medicine used to treat chest pain (nitroglycerin). ? Birth control pills. ? Estrogen. ? Some blood pressure medicines.  Eating or drinking certain products.  Doing physical activity. Other things that may trigger a migraine headache include:  Having a menstrual period.  Pregnancy.  Hunger.  Stress.  Not getting enough sleep or getting too much sleep.  Weather changes.  Tiredness (fatigue). What increases the risk?  Being 29-1 years old.  Being female.  Having a family history of migraine headaches.  Being Caucasian.  Having depression or anxiety.  Being very overweight. What are the signs or symptoms?  A throbbing pain. This pain may: ? Happen in any area of the head, such as on one side or both sides. ? Make it hard to do daily activities. ? Get worse with physical activity. ? Get worse around bright lights or loud noises.  Other symptoms may include: ? Feeling sick to your stomach (nauseous). ? Vomiting. ? Dizziness. ? Being sensitive to bright lights, loud noises, or smells.  Before you get a migraine headache, you may get warning signs (an aura). An aura may include: ? Seeing flashing lights or having blind spots. ? Seeing bright spots, halos, or zigzag lines. ? Having tunnel vision or blurred vision. ? Having numbness or a tingling feeling. ? Having trouble talking. ? Having weak muscles.  Some people have symptoms after a migraine headache (postdromal phase), such as: ? Tiredness. ? Trouble thinking (concentrating). How is this treated?  Taking medicines that: ? Relieve pain. ? Relieve the feeling of being sick to your  stomach. ? Prevent migraine headaches.  Treatment may also include: ? Having acupuncture. ? Avoiding foods that bring on migraine headaches. ? Learning ways to control your body functions (biofeedback). ? Therapy to help you know and deal with negative thoughts (cognitive behavioral therapy). Follow these instructions at home: Medicines  Take over-the-counter and prescription medicines only as told  by your doctor.  Ask your doctor if the medicine prescribed to you: ? Requires you to avoid driving or using heavy machinery. ? Can cause trouble pooping (constipation). You may need to take these steps to prevent or treat trouble pooping:  Drink enough fluid to keep your pee (urine) pale yellow.  Take over-the-counter or prescription medicines.  Eat foods that are high in fiber. These include beans, whole grains, and fresh fruits and vegetables.  Limit foods that are high in fat and sugar. These include fried or sweet foods. Lifestyle  Do not drink alcohol.  Do not use any products that contain nicotine or tobacco, such as cigarettes, e-cigarettes, and chewing tobacco. If you need help quitting, ask your doctor.  Get at least 8 hours of sleep every night.  Limit and deal with stress. General instructions      Keep a journal to find out what may bring on your migraine headaches. For example, write down: ? What you eat and drink. ? How much sleep you get. ? Any change in what you eat or drink. ? Any change in your medicines.  If you have a migraine headache: ? Avoid things that make your symptoms worse, such as bright lights. ? It may help to lie down in a dark, quiet room. ? Do not drive or use heavy machinery. ? Ask your doctor what activities are safe for you.  Keep all follow-up visits as told by your doctor. This is important. Contact a doctor if:  You get a migraine headache that is different or worse than others you have had.  You have more than 15 headache days  in one month. Get help right away if:  Your migraine headache gets very bad.  Your migraine headache lasts longer than 72 hours.  You have a fever.  You have a stiff neck.  You have trouble seeing.  Your muscles feel weak or like you cannot control them.  You start to lose your balance a lot.  You start to have trouble walking.  You pass out (faint).  You have a seizure. Summary  A migraine headache is a very strong throbbing pain on one side or both sides of your head. These headaches can also cause other symptoms.  This condition may be treated with medicines and changes to your lifestyle.  Keep a journal to find out what may bring on your migraine headaches.  Contact a doctor if you get a migraine headache that is different or worse than others you have had.  Contact your doctor if you have more than 15 headache days in a month. This information is not intended to replace advice given to you by your health care provider. Make sure you discuss any questions you have with your health care provider. Document Released: 10/31/2007 Document Revised: 05/15/2018 Document Reviewed: 03/05/2018 Elsevier Patient Education  2020 [employer].

## 2018-10-12 NOTE — Progress Notes (Signed)
Made any corrections needed, and agree with history, physical, neuro exam,assessment and plan as stated.     Jailen Coward, MD [facility]  

## 2018-10-13 ENCOUNTER — Other Ambulatory Visit: Payer: Self-pay | Admitting: Family Medicine

## 2018-10-13 DIAGNOSIS — E669 Obesity, unspecified: Secondary | ICD-10-CM

## 2018-10-15 ENCOUNTER — Encounter: Payer: Self-pay | Admitting: Family Medicine

## 2018-10-29 ENCOUNTER — Telehealth: Payer: Self-pay | Admitting: Obstetrics & Gynecology

## 2018-10-29 NOTE — Telephone Encounter (Signed)
Attempted to call patient about her appointment on 9/25 @ 11:15. No answer left voicemail instructing patient to wear a face mask for the entire appointment and no visitors are allowed during the visit. Patient instructed not to attend the appointment if she was any symptoms. Symptom list and office number left.

## 2018-10-30 ENCOUNTER — Ambulatory Visit: Payer: No Typology Code available for payment source | Admitting: Obstetrics & Gynecology

## 2018-10-30 ENCOUNTER — Encounter: Payer: Self-pay | Admitting: Obstetrics & Gynecology

## 2018-11-13 ENCOUNTER — Encounter: Payer: Self-pay | Admitting: Family Medicine

## 2018-11-13 MED FILL — PROPRANOLOL 10 MG TABLET: 10 | 30 days supply | Qty: 60 | Fill #0

## 2018-11-13 MED FILL — metFORMIN HCL 500 MG TABS: 500 | 30 days supply | Qty: 60 | Fill #0

## 2019-02-24 ENCOUNTER — Encounter: Payer: Self-pay | Admitting: Family Medicine

## 2019-02-24 ENCOUNTER — Other Ambulatory Visit: Payer: Self-pay | Admitting: *Deleted

## 2019-02-24 ENCOUNTER — Encounter: Payer: Self-pay | Admitting: Internal Medicine

## 2019-02-24 DIAGNOSIS — G43009 Migraine without aura, not intractable, without status migrainosus: Secondary | ICD-10-CM

## 2019-02-24 MED ORDER — PROPRANOLOL HCL 10 MG PO TABS: 10.0000 mg | ORAL_TABLET | Freq: Two times a day (BID) | ORAL | 5 refills | Status: DC

## 2019-02-24 MED ORDER — NAPROXEN 500 MG PO TABS: 500.0000 mg | ORAL_TABLET | Freq: Two times a day (BID) | ORAL | 2 refills | Status: DC

## 2019-02-24 MED FILL — PROPRANOLOL 10 MG TABLET: 10 | 30 days supply | Qty: 60 | Fill #0

## 2019-02-24 MED FILL — NAPROXEN 500 MG TABS: 500 | 15 days supply | Qty: 30 | Fill #0

## 2019-02-25 ENCOUNTER — Encounter: Payer: Self-pay | Admitting: Internal Medicine

## 2019-03-10 ENCOUNTER — Ambulatory Visit (INDEPENDENT_AMBULATORY_CARE_PROVIDER_SITE_OTHER): Payer: No Typology Code available for payment source | Admitting: Obstetrics & Gynecology

## 2019-03-10 ENCOUNTER — Other Ambulatory Visit: Payer: Self-pay

## 2019-03-10 ENCOUNTER — Encounter: Payer: Self-pay | Admitting: Obstetrics & Gynecology

## 2019-03-10 VITALS — BP 143/104 | HR 66 | Wt 316.1 lb

## 2019-03-10 DIAGNOSIS — E282 Polycystic ovarian syndrome: Secondary | ICD-10-CM

## 2019-03-10 DIAGNOSIS — Z1151 Encounter for screening for human papillomavirus (HPV): Secondary | ICD-10-CM

## 2019-03-10 DIAGNOSIS — Z01419 Encounter for gynecological examination (general) (routine) without abnormal findings: Secondary | ICD-10-CM | POA: Diagnosis not present

## 2019-03-10 DIAGNOSIS — N979 Female infertility, unspecified: Secondary | ICD-10-CM | POA: Diagnosis not present

## 2019-03-10 DIAGNOSIS — Z1331 Encounter for screening for depression: Secondary | ICD-10-CM | POA: Diagnosis not present

## 2019-03-10 DIAGNOSIS — Z124 Encounter for screening for malignant neoplasm of cervix: Secondary | ICD-10-CM

## 2019-03-10 MED ORDER — MEDROXYPROGESTERONE ACETATE 10 MG PO TABS: 10.0000 mg | ORAL_TABLET | Freq: Every day | ORAL | 3 refills | Status: DC

## 2019-03-10 MED ORDER — LETROZOLE 2.5 MG PO TABS: 2.5000 mg | ORAL_TABLET | Freq: Every day | ORAL | 2 refills | Status: DC

## 2019-03-10 MED FILL — LETROZOLE 2.5 MG TABLET: 2.5 | 5 days supply | Qty: 5 | Fill #0

## 2019-03-10 MED FILL — MEDROXYPROGESTERONE 10 MG T: 10 | 10 days supply | Qty: 10 | Fill #0

## 2019-03-10 NOTE — Patient Instructions (Signed)
Preventive Care 21-32 Years Old, Female Preventive care refers to visits with your health care provider and lifestyle choices that can promote health and wellness. This includes:  A yearly physical exam. This may also be called an annual well check.  Regular dental visits and eye exams.  Immunizations.  Screening for certain conditions.  Healthy lifestyle choices, such as eating a healthy diet, getting regular exercise, not using drugs or products that contain nicotine and tobacco, and limiting alcohol use. What can I expect for my preventive care visit? Physical exam Your health care provider will check your:  Height and weight. This may be used to calculate body mass index (BMI), which tells if you are at a healthy weight.  Heart rate and blood pressure.  Skin for abnormal spots. Counseling Your health care provider may ask you questions about your:  Alcohol, tobacco, and drug use.  Emotional well-being.  Home and relationship well-being.  Sexual activity.  Eating habits.  Work and work environment.  Method of birth control.  Menstrual cycle.  Pregnancy history. What immunizations do I need?  Influenza (flu) vaccine  This is recommended every year. Tetanus, diphtheria, and pertussis (Tdap) vaccine  You may need a Td booster every 10 years. Varicella (chickenpox) vaccine  You may need this if you have not been vaccinated. Human papillomavirus (HPV) vaccine  If recommended by your health care provider, you may need three doses over 6 months. Measles, mumps, and rubella (MMR) vaccine  You may need at least one dose of MMR. You may also need a second dose. Meningococcal conjugate (MenACWY) vaccine  One dose is recommended if you are age 19-21 years and a first-year college student living in a residence hall, or if you have one of several medical conditions. You may also need additional booster doses. Pneumococcal conjugate (PCV13) vaccine  You may need  this if you have certain conditions and were not previously vaccinated. Pneumococcal polysaccharide (PPSV23) vaccine  You may need one or two doses if you smoke cigarettes or if you have certain conditions. Hepatitis A vaccine  You may need this if you have certain conditions or if you travel or work in places where you may be exposed to hepatitis A. Hepatitis B vaccine  You may need this if you have certain conditions or if you travel or work in places where you may be exposed to hepatitis B. Haemophilus influenzae type b (Hib) vaccine  You may need this if you have certain conditions. You may receive vaccines as individual doses or as more than one vaccine together in one shot (combination vaccines). Talk with your health care provider about the risks and benefits of combination vaccines. What tests do I need?  Blood tests  Lipid and cholesterol levels. These may be checked every 5 years starting at age 20.  Hepatitis C test.  Hepatitis B test. Screening  Diabetes screening. This is done by checking your blood sugar (glucose) after you have not eaten for a while (fasting).  Sexually transmitted disease (STD) testing.  BRCA-related cancer screening. This may be done if you have a family history of breast, ovarian, tubal, or peritoneal cancers.  Pelvic exam and Pap test. This may be done every 3 years starting at age 21. Starting at age 30, this may be done every 5 years if you have a Pap test in combination with an HPV test. Talk with your health care provider about your test results, treatment options, and if necessary, the need for more tests.   Follow these instructions at home: Eating and drinking   Eat a diet that includes fresh fruits and vegetables, whole grains, lean protein, and low-fat dairy.  Take vitamin and mineral supplements as recommended by your health care provider.  Do not drink alcohol if: ? Your health care provider tells you not to drink. ? You are  pregnant, may be pregnant, or are planning to become pregnant.  If you drink alcohol: ? Limit how much you have to 0-1 drink a day. ? Be aware of how much alcohol is in your drink. In the [place] one drink equals one 12 oz bottle of beer (355 mL), one 5 oz glass of wine (148 mL), or one 1 oz glass of hard liquor (44 mL). Lifestyle  Take daily care of your teeth and gums.  Stay active. Exercise for at least 30 minutes on 5 or more days each week.  Do not use any products that contain nicotine or tobacco, such as cigarettes, e-cigarettes, and chewing tobacco. If you need help quitting, ask your health care provider.  If you are sexually active, practice safe sex. Use a condom or other form of birth control (contraception) in order to prevent pregnancy and STIs (sexually transmitted infections). If you plan to become pregnant, see your health care provider for a preconception visit. What's next?  Visit your health care provider once a year for a well check visit.  Ask your health care provider how often you should have your eyes and teeth checked.  Stay up to date on all vaccines. This information is not intended to replace advice given to you by your health care provider. Make sure you discuss any questions you have with your health care provider. Document Revised: 10/02/2017 Document Reviewed: 10/02/2017 Elsevier Patient Education  2020 [employer].

## 2019-03-10 NOTE — Progress Notes (Signed)
GYNECOLOGY ANNUAL PREVENTATIVE CARE ENCOUNTER NOTE  History:     Kristen [name] is a 34 y.o. G0P0 female here for a routine annual gynecologic exam.  Current complaints: missed periods since 12/2018. Worried about this. On Metformin for PCOS but cannot tolerate this due to GI side effects.   Denies abnormal vaginal discharge, pelvic pain, problems with intercourse or other gynecologic concerns.    Gynecologic History Patient's last menstrual period was 12/10/2018 (exact date). Contraception: none Last Pap: 03/25/2014. Results were: normal.  Obstetric History OB History  Gravida Para Term Preterm AB Living  0            SAB TAB Ectopic Multiple Live Births               Past Medical History:  Diagnosis Date  . Anxiety 2013   treated at [facility] by Pauline Good   . Asthma 1989    no hospitalization, or intubation, previously on advair and singulair. asthma worsening.   . 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 Depression 2001   depressed since childhood   . 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 Kyphosis of thoracic region 2004    painful, runs in dad side   . Migraine   . Pancreatitis 2003   gallstone pancreatitis   . Pancreatitis due to biliary obstruction 2003  . PCOS (polycystic ovarian syndrome) 2014   facial hair, irregular periods, never been pregnant     Past Surgical History:  Procedure Laterality Date  . CHOLECYSTECTOMY  2003   . OPEN REDUCTION INTERNAL FIXATION (ORIF) PROXIMAL PHALANX Right 07/23/2013   Procedure: OPEN TREATMENT RIGHT RING FINGER, PROXIMAL INTERPHALANGEAL/JOINT FRACTURE DISLOCATION;  Surgeon: Jolyn Nap, MD;  Location: [facility];  Service: Orthopedics;  Laterality: Right;  . TONSILLECTOMY      Current Outpatient Medications on File Prior to Visit  Medication Sig Dispense Refill  . albuterol (PROVENTIL HFA;VENTOLIN HFA) 108 (90 Base) MCG/ACT inhaler Inhale 2 puffs every 6 (six) hours as needed into the  lungs for wheezing or shortness of breath. 6.7 g 3  . albuterol (PROVENTIL) (2.5 MG/3ML) 0.083% nebulizer solution Take 3 mLs (2.5 mg total) every 6 (six) hours as needed by nebulization for wheezing or shortness of breath. 50 vial 5  . naproxen (NAPROSYN) 500 MG tablet Take 1 tablet (500 mg total) by mouth 2 (two) times daily with a meal. As needed for pain 30 tablet 2  . propranolol (INDERAL) 10 MG tablet Take 1 tablet (10 mg total) by mouth 2 (two) times daily. 60 tablet 5  . metFORMIN (GLUCOPHAGE) 500 MG tablet Take 1 tablet (500 mg total) by mouth 2 (two) times daily with a meal. (Patient not taking: Reported on 03/10/2019) 60 tablet 1   No current facility-administered medications on file prior to visit.    Allergies  Allergen Reactions  . Mucinex [Guaifenesin Er] Hives, Itching and Swelling  . Penicillins Swelling  . Contrast Media [Iodinated Diagnostic Agents] Nausea And Vomiting  . Multihance [Gadobenate] Nausea And Vomiting    Social History:  reports that she has never smoked. She has never used smokeless tobacco. She reports that she does not drink alcohol or use drugs.  Family History  Problem Relation Age of Onset  . Hypertension Mother   . Arnold-Chiari malformation Mother   . Restless legs syndrome Mother   . Osteoporosis Mother   . COPD Mother   .  Asthma Mother   . Squamous cell carcinoma Mother        skin   . Eczema Mother   . Arthritis Mother   . Peripheral Artery Disease Mother   . Hypertension Father   . Scoliosis Father   . Bipolar disorder Father   . Congestive Heart Failure Father   . COPD Father   . Diabetes Maternal Grandmother   . Hypertension Maternal Grandmother   . Cancer Paternal Grandmother        colon  . Bone cancer Maternal Grandfather 82       bone marrow   . Multiple myeloma Maternal Grandfather   . Diabetes Paternal Grandfather     The following portions of the patient's history were reviewed and updated as appropriate: allergies,  current medications, past family history, past medical history, past social history, past surgical history and problem list.  Review of Systems Pertinent items noted in HPI and remainder of comprehensive ROS otherwise negative.  Physical Exam:  BP (!) 143/104   Pulse 66   Wt (!) 316 lb 1.6 oz (143.4 kg)   LMP 12/10/2018 (Exact Date)   BMI 50.26 kg/m  CONSTITUTIONAL: Well-developed, well-nourished female in no acute distress.  HENT:  Normocephalic, atraumatic, External right and left ear normal. Oropharynx is clear and moist EYES: Conjunctivae and EOM are normal. Pupils are equal, round, and reactive to light. No scleral icterus.  NECK: Normal range of motion, supple, no masses.  Normal thyroid.  SKIN: Skin is warm and dry. No rash noted. Not diaphoretic. No erythema. No pallor. MUSCULOSKELETAL: Normal range of motion. No tenderness.  No cyanosis, clubbing, or edema.   NEUROLOGIC: Alert and oriented to person, place, and time. Normal reflexes, muscle tone coordination. No cranial nerve deficit noted. PSYCHIATRIC: Normal mood and affect. Normal behavior. Normal judgment and thought content. CARDIOVASCULAR: Normal heart rate noted, regular rhythm RESPIRATORY: Clear to auscultation bilaterally. Effort and breath sounds normal, no problems with respiration noted. BREASTS: Symmetric in size. No masses, tenderness, skin changes, nipple drainage, or lymphadenopathy bilaterally. ABDOMEN: Soft, obese, no distention appreciated.  No tenderness, rebound or guarding.  PELVIC: Normal appearing external genitalia and urethral meatus; normal appearing vaginal mucosa and cervix.  Normal appearing discharge.  Pap smear obtained.    Assessment and Plan:    1. PCOS (polycystic ovarian syndrome) 2. Infertility, female Provera given to induce bleeding.  Talked about PCOS related infertility. Discussed Letrozole which has been shown to be superior to Clomid in ovulation induction, in PCOS patients with BMI  >30.  Patient agreed to try this.  Letrozole prescribed, 2.5 mg po daily on days 3 to 7 following a spontaneous menses or progestin-induced bleed. If the cycle is ovulatory but pregnancy has not occurred, the same dose should be used in the next cycle. If ovulation does not occur, the dose should be increased to 5 mg/day cycle days 3 to 7, with a maximal dose of 7.5 mg/day.   Provera 10 mg po qd x 10 days also prescribed for progestin-induced bleed. Recommended ovulation predictor kits and regular intercourse.  Patient also wanted referral to Infertility specialist, this was done. Weight loss was also recommended to help with ovulatory cycles. - medroxyPROGESTERone (PROVERA) 10 MG tablet; Take 1 tablet (10 mg total) by mouth daily. Use for ten days  Dispense: 10 tablet; Refill: 3 - letrozole (FEMARA) 2.5 MG tablet; Take 1 tablet (2.5 mg total) by mouth daily. Take on days 3 to 7 following a spontaneous menses or  progestin-induced bleed.  Dispense: 5 tablet; Refill: 2 - Ambulatory referral to Infertility  3. Positive depression screening - Ambulatory referral to [facility]  4. Well woman exam with routine gynecological exam - Cytology - PAP( [facility]) Will follow up results of pap smear and manage accordingly. Routine preventative health maintenance measures emphasized. Please refer to After Visit Summary for other counseling recommendations.      Verita Schneiders, MD, [place] [facility] [employer], [place]

## 2019-03-12 ENCOUNTER — Other Ambulatory Visit: Payer: Self-pay

## 2019-03-12 ENCOUNTER — Ambulatory Visit: Payer: No Typology Code available for payment source | Attending: Internal Medicine | Admitting: Internal Medicine

## 2019-03-12 ENCOUNTER — Encounter: Payer: Self-pay | Admitting: Internal Medicine

## 2019-03-12 ENCOUNTER — Ambulatory Visit
Payer: No Typology Code available for payment source | Attending: Internal Medicine | Admitting: Licensed Clinical Social Worker

## 2019-03-12 VITALS — BP 140/90 | HR 59 | Temp 97.5°F | Ht 65.0 in | Wt 318.0 lb

## 2019-03-12 DIAGNOSIS — F411 Generalized anxiety disorder: Secondary | ICD-10-CM

## 2019-03-12 DIAGNOSIS — F32A Depression, unspecified: Secondary | ICD-10-CM | POA: Insufficient documentation

## 2019-03-12 DIAGNOSIS — F322 Major depressive disorder, single episode, severe without psychotic features: Secondary | ICD-10-CM | POA: Diagnosis not present

## 2019-03-12 DIAGNOSIS — F4010 Social phobia, unspecified: Secondary | ICD-10-CM | POA: Diagnosis not present

## 2019-03-12 DIAGNOSIS — F329 Major depressive disorder, single episode, unspecified: Secondary | ICD-10-CM

## 2019-03-12 DIAGNOSIS — G43109 Migraine with aura, not intractable, without status migrainosus: Secondary | ICD-10-CM | POA: Insufficient documentation

## 2019-03-12 DIAGNOSIS — G43909 Migraine, unspecified, not intractable, without status migrainosus: Secondary | ICD-10-CM | POA: Diagnosis not present

## 2019-03-12 DIAGNOSIS — F419 Anxiety disorder, unspecified: Secondary | ICD-10-CM | POA: Diagnosis not present

## 2019-03-12 DIAGNOSIS — I10 Essential (primary) hypertension: Secondary | ICD-10-CM

## 2019-03-12 DIAGNOSIS — E282 Polycystic ovarian syndrome: Secondary | ICD-10-CM

## 2019-03-12 DIAGNOSIS — J4520 Mild intermittent asthma, uncomplicated: Secondary | ICD-10-CM

## 2019-03-12 DIAGNOSIS — M25512 Pain in left shoulder: Secondary | ICD-10-CM

## 2019-03-12 HISTORY — DX: Essential (primary) hypertension: I10

## 2019-03-12 LAB — CYTOLOGY - PAP
Comment: NEGATIVE
Diagnosis: NEGATIVE
High risk HPV: NEGATIVE

## 2019-03-12 MED ORDER — ALBUTEROL SULFATE HFA 108 (90 BASE) MCG/ACT IN AERS: 2.0000 | INHALATION_SPRAY | Freq: Four times a day (QID) | RESPIRATORY_TRACT | 3 refills | Status: DC | PRN

## 2019-03-12 MED ORDER — FLUOXETINE HCL 10 MG PO CAPS: 10.0000 mg | ORAL_CAPSULE | Freq: Every day | ORAL | 1 refills | Status: DC

## 2019-03-12 MED FILL — ALBUTEROL SULFATE HFA 108 (: 108 (90 BAS | 25 days supply | Qty: 18 | Fill #0

## 2019-03-12 MED FILL — FLUoxetine HCL 10 MG CAPS: 10 | 30 days supply | Qty: 30 | Fill #0

## 2019-03-12 NOTE — Progress Notes (Signed)
Patient ID: Kristen [name], female    DOB: 04/13/1985  MRN: [id number]  CC: No chief complaint on file.   Subjective: Kristen [name] is a 34 y.o. female who presents for chronic ds management Her concerns today include:  Pt with hx of PCOS, morbid obesity, Dep, RLS, social phobia, lymphocytic hypophysitis, hyperprolactinemia, severe persistent asthma, migraines, OSA CPAP.  Patient was last seen in 2018.  She states she was not following up regularly due to lack of insurance.  She now has insurance and wanted to get back into care.  Anxiety and dep: Her main concern today is worsening anxiety and depression.  "My anxiety and depression have skyrocketed through the roof."  She has some social phobias.  She feels socially awkward.  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  She feels sensory overloaded at times.  "I have never been a people person.  I feel like I'm an imposter." -depression is worse "a million time." Okay when at home.  More stressful when out and having to interact with people -not seeing a therapist.  Use to be seen at [facility].  Prescriped  Zoloft and Vistirill.  Helped about 30% -endorses SI just about every day "all my life" but worseing over last few yrs.  Suicide attemp as teenager.  No active plans to hurt self at this time.  Pain in LT shoulder x 2 wks. She is not sure of initiating factor.  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  She also wonders whether she may have slept wrong on that side.   Dull ache and radiates to deltoid Intermittent, last 1 hr No numbness, sometimes tingling in fingers Not taking anything for the pain.  Of note she has Naprosyn on her med list which she takes as needed for migraines.  She reports more frequent migraine episodes lately. Sees Dr. Jaynee Eagles and her NP, next appt March 3rd. On Naprosyn and Propranolol.   Propranolol was chosen as prophylaxis because her blood pressure had also been running persistently high.  Blood pressure today is elevated.  She has not taken propranolol as yet for the morning.  She does have a device to check blood pressure but is hard to apply it on her arm.  She limits salt in the foods.  Seen GYN for PCOS.  No bleeding since Nov 5th.  She wonders if the amenorrhea is stress-induced.  She has been seeing her gynecologist regularly.  She is on Provera to take 10 days a month to try to induce bleeding.  Also prescribed Femara  Asthma: using neb QOD at nights.  Uses inh very rarely Never smoke Flare of asthma if she does a lot of moving around or exercise and and dry air.  Patient Active Problem List   Diagnosis Date Noted  . Lymphocytic hypophysitis ([place]) 08/15/2016  . Elevated prolactin level 07/05/2016  . Vulvar lesion 07/05/2016  . Restless legs 05/24/2016  . Seasonal allergies 05/13/2014  . Intertrigo 05/13/2014  . Decreased vision 05/13/2014  . PCOS (polycystic ovarian syndrome) 02/07/2014  . Social phobia 02/02/2007  . ADD 02/02/2007  . 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 Asthma, severe persistent 02/02/2007  . SCOLIOSIS 02/02/2007  . OTHER SPECIFIED CONGENITAL ANOMALIES OF KIDNEY 02/02/2007  . ELECTROCARDIOGRAM, ABNORMAL 02/02/2007     Current Outpatient Medications on File Prior to  Visit  Medication Sig Dispense Refill  . albuterol (PROVENTIL) (2.5 MG/3ML) 0.083% nebulizer solution Take 3 mLs (2.5 mg total) every 6 (six) hours as needed by nebulization for wheezing or shortness of breath. 50 vial 5  . naproxen (NAPROSYN) 500 MG tablet Take 1 tablet (500 mg total) by mouth 2 (two) times daily with a meal. As needed for pain 30 tablet 2  . propranolol (INDERAL) 10 MG tablet Take 1 tablet (10 mg total) by mouth 2 (two) times daily. 60 tablet 5  . letrozole (FEMARA) 2.5 MG tablet Take 1 tablet (2.5 mg total) by mouth daily. Take on days 3 to 7 following a spontaneous menses  or progestin-induced bleed. (Patient not taking: Reported on 03/12/2019) 5 tablet 2  . medroxyPROGESTERone (PROVERA) 10 MG tablet Take 1 tablet (10 mg total) by mouth daily. Use for ten days (Patient not taking: Reported on 03/12/2019) 10 tablet 3  . metFORMIN (GLUCOPHAGE) 500 MG tablet Take 1 tablet (500 mg total) by mouth 2 (two) times daily with a meal. (Patient not taking: Reported on 03/10/2019) 60 tablet 1   No current facility-administered medications on file prior to visit.    Allergies  Allergen Reactions  . Mucinex [Guaifenesin Er] Hives, Itching and Swelling  . Penicillins Swelling  . Contrast Media [Iodinated Diagnostic Agents] Nausea And Vomiting  . Multihance [Gadobenate] Nausea And Vomiting    Social History   Socioeconomic History  . Marital status: Single    Spouse name: Not on file  . Number of children: 0  . Years of education: college   . Highest education level: Not on file  Occupational History  . Occupation: Unemployed   Tobacco Use  . Smoking status: Never Smoker  . Smokeless tobacco: Never Used  Substance and Sexual Activity  . Alcohol use: No  . Drug use: No  . Sexual activity: Yes    Birth control/protection: None  Other Topics Concern  . Not on file  Social History Narrative   Lives with mom [name])    Right-handed   Caffeine: 3 cups of coffee per day   Social Determinants of Health   Financial Resource Strain:   . Difficulty of Paying Living Expenses: Not on file  Food Insecurity:   . Worried About Charity fundraiser in the Last Year: Not on file  . Ran Out of Food in the Last Year: Not on file  Transportation Needs:   . Lack of Transportation (Medical): Not on file  . Lack of Transportation (Non-Medical): Not on file  Physical Activity:   . Days of Exercise per Week: Not on file  . Minutes of Exercise per Session: Not on file  Stress:   . Feeling of Stress : Not on file  Social Connections:   . Frequency of Communication with Friends  and Family: Not on file  . Frequency of Social Gatherings with Friends and Family: Not on file  . Attends Religious Services: Not on file  . Active Member of Clubs or Organizations: Not on file  . Attends Archivist Meetings: Not on file  . Marital Status: Not on file  Intimate Partner Violence:   . Fear of Current or Ex-Partner: Not on file  . Emotionally Abused: Not on file  . Physically Abused: Not on file  . Sexually Abused: Not on file    Family History  Problem Relation Age of Onset  . Hypertension Mother   . Arnold-Chiari malformation Mother   . Restless legs  syndrome Mother   . Osteoporosis Mother   . COPD Mother   . Asthma Mother   . Squamous cell carcinoma Mother        skin   . Eczema Mother   . Arthritis Mother   . Peripheral Artery Disease Mother   . Hypertension Father   . Scoliosis Father   . Bipolar disorder Father   . Congestive Heart Failure Father   . COPD Father   . Diabetes Maternal Grandmother   . Hypertension Maternal Grandmother   . Cancer Paternal Grandmother        colon  . Bone cancer Maternal Grandfather 82       bone marrow   . Multiple myeloma Maternal Grandfather   . Diabetes Paternal Grandfather     Past Surgical History:  Procedure Laterality Date  . CHOLECYSTECTOMY  2003   . OPEN REDUCTION INTERNAL FIXATION (ORIF) PROXIMAL PHALANX Right 07/23/2013   Procedure: OPEN TREATMENT RIGHT RING FINGER, PROXIMAL INTERPHALANGEAL/JOINT FRACTURE DISLOCATION;  Surgeon: Jolyn Nap, MD;  Location: [facility];  Service: Orthopedics;  Laterality: Right;  . TONSILLECTOMY      ROS: Review of Systems Negative except as stated above  PHYSICAL EXAM: BP 140/90   Pulse (!) 59   Temp (!) 97.5 F (36.4 C) (Oral)   Ht 5' 5" (1.651 m)   Wt (!) 318 lb (144.2 kg)   LMP 12/10/2018   SpO2 100%   BMI 52.92 kg/m   Physical Exam  General appearance - alert, well appearing, morbidly obese young Caucasian female and in no  distress Mental status -patient with good eye contact.  She is tearful at times Eyes - pupils equal and reactive, extraocular eye movements intact Mouth - mucous membranes moist, pharynx normal without lesions Neck - supple, no significant adenopathy Chest - clear to auscultation, no wheezes, rales or rhonchi, symmetric air entry Heart - normal rate, regular rhythm, normal S1, S2, no murmurs, rubs, clicks or gallops Extremities -no lower extremity edema MSK: Right shoulder: Mild tenderness on palpation over the anterior joint.  Good passive and active range of motion.  Drop arm test negative. Depression screen [facility] 2/9 03/12/2019 03/10/2019 01/22/2017  Decreased Interest _0 Down, Depressed, Hopeless _1 PHQ - 2 Score _2 Altered sleeping 0 0 3  Tired, decreased energy _3 Change in appetite 0 0 0  Feeling bad or failure about yourself  _4 Trouble concentrating 3 3 0  Moving slowly or fidgety/restless 3 3 0  Suicidal thoughts 3 3 0  PHQ-9 Score _5 Some recent data might be hidden   GAD 7 : Generalized Anxiety Score 03/12/2019 03/10/2019 01/22/2017 01/09/2017  Nervous, Anxious, on Edge _6 Control/stop worrying 3 3 0 0  Worry too much - different things _7 0  Trouble relaxing 3 1 0 0  Restless 0 0 0 0  Easily annoyed or irritable _8 0  Afraid - awful might happen 3 3 0 0  Total GAD 7 Score _9 CMP Latest Ref Rng & Units 07/22/2017 11/25/2016 09/18/2016  Glucose 65 - 99 mg/dL 91 84 100(H)  BUN 6 - 20 mg/dL _10 Creatinine 0.57 - 1.00 mg/dL 0.92 0.96 0.91  Sodium 134 - 144 mmol/L 141 144 139  Potassium 3.5 - 5.2 mmol/L 4.2 4.0 3.7  Chloride 96 - 106 mmol/L 102 106 105  CO2 20 - 29 mmol/L _0 Calcium 8.7 - 10.2 mg/dL 9.5 9.6 9.1  Total Protein 6.0 - 8.5 g/dL 6.4 6.8 -  Total Bilirubin 0.0 - 1.2 mg/dL 0.4 0.5 -  Alkaline Phos 39 - 117 IU/L 118(H) 130(H) -  AST 0 - 40 IU/L 16 20 -  ALT 0 - 32 IU/L 21 31 -   Lipid Panel  No results  found for: CHOL, TRIG, HDL, CHOLHDL, VLDL, LDLCALC, LDLDIRECT  CBC    Component Value Date/Time   WBC 10.0 07/22/2017 1339   WBC 10.2 01/08/2017 0950   RBC 4.72 07/22/2017 1339   RBC 5.22 (H) 01/08/2017 0950   HGB 13.7 07/22/2017 1339   HCT 41.3 07/22/2017 1339   PLT 267 07/22/2017 1339   MCV 88 07/22/2017 1339   MCH 29.0 07/22/2017 1339   MCH 29.2 09/26/2015 1804   MCHC 33.2 07/22/2017 1339   MCHC 33.2 01/08/2017 0950   RDW 13.9 07/22/2017 1339   LYMPHSABS 3.3 (H) 07/22/2017 1339   MONOABS 0.5 01/08/2017 0950   EOSABS 0.5 (H) 07/22/2017 1339   BASOSABS 0.0 07/22/2017 1339    ASSESSMENT AND PLAN: 1. Anxiety and depression We discussed getting her treatment with medication and with a mental health professional.  Patient agreeable to both.  We will try her with Prozac.  Advised her to take it in the mornings.  Let me know if any increased depression or anxiety with this medication.  I will have her follow-up with me in 6 weeks -LCSW to see patient today as well. - Ambulatory referral to Psychiatry - FLUoxetine (PROZAC) 10 MG capsule; Take 1 capsule (10 mg total) by mouth daily.  Dispense: 30 capsule; Refill: 1  2. Social phobia See #1 above - Ambulatory referral to Psychiatry  3. Acute pain of left shoulder Likely tendinitis or bursitis.  I recommend purchasing some Voltaren gel over-the-counter and using it as needed.  She has Naprosyn at home which she uses as needed for migraines but I did not want increased use of that due to blood pressure issues  4. Migraine without status migrainosus, not intractable, unspecified migraine type Patient advised to request earlier appointment with her neurologist  5. Essential hypertension Not at goal.  She has not taken propranolol as yet today.  We will have her follow-up with clinical pharmacist in 1 to 2 weeks for repeat blood pressure check.  If still elevated I would start amlodipine.  Encouraged to continue low-salt diet  6. Mild  intermittent asthma without complication Refill albuterol inhaler to use as needed and before exercise  7. PCOS (polycystic ovarian syndrome) Followed by gynecology.    Patient was given the opportunity to ask questions.  Patient verbalized understanding of the plan and was able to repeat key elements of the plan.   Orders Placed This Encounter  Procedures  . Ambulatory referral to Psychiatry     Requested Prescriptions   Signed Prescriptions Disp Refills  . FLUoxetine (PROZAC) 10 MG capsule 30 capsule 1    Sig: Take 1 capsule (10 mg total) by mouth daily.  Marland Kitchen albuterol (VENTOLIN HFA) 108 (90 Base) MCG/ACT inhaler 6.7 g 3    Sig: Inhale 2 puffs into the lungs every 6 (six) hours as needed for wheezing or shortness of breath.    Return in about 6 weeks (around 04/23/2019).  Karle Plumber, MD, FACP

## 2019-03-12 NOTE — Patient Instructions (Addendum)
Please give patient an appointment with the clinical pharmacist in 1 week for repeat blood pressure check. Continue to limit salt in the foods as much as possible.  Purchase and use Voltaren gel on the left shoulder.  We have started you on a medication called Prozac to help with your anxiety and depression.  I have also referred you to behavioral health.  If you have any increased depression or anxiety on the medication please let me know.

## 2019-03-12 NOTE — Progress Notes (Signed)
Pt. Is here for follow up.   Pt. having pain on her left arm.

## 2019-03-15 NOTE — BH Specialist Note (Signed)
Integrated Behavioral Health Initial Visit  MRN: [id number] Name: Kristen [name]  Number of Integrated Behavioral Health Clinician visits:: 1/6 Session Start time: 8:17  Session End time: 9:30 Total time: 59  Type of Service: Integrated Behavioral Health- Individual/Family Interpretor:No. Interpretor Name and Language: n/a   Warm Hand Off Completed.       SUBJECTIVE: Kristen [name] is a 34 y.o. female accompanied by n/a Patient was referred by Jaynie Collins, MD for positive depression screen. Patient reports the following symptoms/concerns: Pt states her primary concern today is social anxiety that is causing depression and  interfering with her daily functioning, as well as difficulty caring for her elderly parents (father experiences bipolar disorder and mother has a brain injury). Pt suspects she may be on the autism spectrum, and is open to learning coping strategies today. Duration of problem: Ongoing since childhood; Severity of problem: severe  OBJECTIVE: Mood: Anxious and Affect: Tearful Risk of harm to self or others: Suicidal ideation No plan to harm self or others  LIFE CONTEXT: Family and Social: Pt has fiancee and lives part of time with elderly parents School/Work: Working fulltime as Water engineer: Recognizing a greater need for self-care Life Changes: Managing life stressors  GOALS ADDRESSED: Patient will: 1. Reduce symptoms of: anxiety, depression and stress 2. Increase knowledge and/or ability of: healthy habits, self-management skills and stress reduction  3. Demonstrate ability to: Increase healthy adjustment to current life circumstances and Increase adequate support systems for patient/family  INTERVENTIONS: Interventions utilized: Mindfulness or Management consultant, Brief CBT, Psychoeducation and/or Health Education and Link to [facility]  Standardized Assessments completed: Took PHQ9/GAD7 less than 2 weeks prior  ASSESSMENT: Patient  currently experiencing Anxiety disorder, unspecified, Major depressive disorder, recurrent, severe, without psychotic features  and Psychosocial stressors.   Patient may benefit from psychoeducation and brief therapeutic interventions regarding coping with symptoms of anxiety, depression, and life stress  PLAN: 1. Follow up with behavioral health clinician on : Two weeks 2. Behavioral recommendations:  -Begin taking Prozac as prescribed by medical provider -Continue with plan to attend initial psychiatry appointment (talk to psychiatry about FMLA) -Begin implementing Worry Time strategy today, to begin to focus on tasks within realm of personal control -CALM relaxation breathing exercise twice daily (morning before work; at bedtime with sleep sounds) -Consider [facility] Family-to-Family online support group as an additional support 3. Referral(s): Integrated [facility] (In Clinic) and Community Resources:  Renaldo Fiddler [place], [place]  Depression screen [facility] 2/9 03/12/2019 03/10/2019 01/22/2017 01/09/2017 12/23/2016  Decreased Interest 3 3 1 1 1   Down, Depressed, Hopeless 3 3 3 3 2   PHQ - 2 Score 6 6 4 4 3   Altered sleeping 0 0 3 3 3   Tired, decreased energy 3 3 2 3 1   Change in appetite 0 0 0 0 0  Feeling bad or failure about yourself  3 3 3 1 2   Trouble concentrating 3 3 0 0 0  Moving slowly or fidgety/restless 3 3 0 0 2  Suicidal thoughts 3 3 0 0 0  PHQ-9 Score 21 21 12 11 11   Some recent data might be hidden   GAD 7 : Generalized Anxiety Score 03/12/2019 03/10/2019 01/22/2017 01/09/2017  Nervous, Anxious, on Edge 3 3 1 2   Control/stop worrying 3 3 0 0  Worry too much - different things 3 3 1  0  Trouble relaxing 3 1 0 0  Restless 0 0 0 0  Easily annoyed or irritable 3 2  2 0  Afraid - awful might happen 3 3 0 0  Total GAD 7 Score 18 15 4  2

## 2019-03-16 ENCOUNTER — Other Ambulatory Visit: Payer: Self-pay

## 2019-03-17 ENCOUNTER — Encounter: Payer: Self-pay | Admitting: Internal Medicine

## 2019-03-17 ENCOUNTER — Ambulatory Visit (INDEPENDENT_AMBULATORY_CARE_PROVIDER_SITE_OTHER): Payer: No Typology Code available for payment source | Admitting: Family Medicine

## 2019-03-17 ENCOUNTER — Encounter: Payer: Self-pay | Admitting: Family Medicine

## 2019-03-17 VITALS — BP 141/86 | HR 72

## 2019-03-17 DIAGNOSIS — E230 Hypopituitarism: Secondary | ICD-10-CM

## 2019-03-17 DIAGNOSIS — G4733 Obstructive sleep apnea (adult) (pediatric): Secondary | ICD-10-CM | POA: Diagnosis not present

## 2019-03-17 DIAGNOSIS — G43109 Migraine with aura, not intractable, without status migrainosus: Secondary | ICD-10-CM | POA: Diagnosis not present

## 2019-03-17 DIAGNOSIS — F419 Anxiety disorder, unspecified: Secondary | ICD-10-CM | POA: Diagnosis not present

## 2019-03-17 DIAGNOSIS — Z9989 Dependence on other enabling machines and devices: Secondary | ICD-10-CM

## 2019-03-17 DIAGNOSIS — F329 Major depressive disorder, single episode, unspecified: Secondary | ICD-10-CM

## 2019-03-17 MED ORDER — PROPRANOLOL HCL 10 MG PO TABS: 20.0000 mg | ORAL_TABLET | Freq: Two times a day (BID) | ORAL | 5 refills | Status: DC

## 2019-03-17 MED FILL — PROPRANOLOL 10 MG TABLET: 10 | 30 days supply | Qty: 120 | Fill #0

## 2019-03-17 NOTE — Patient Instructions (Addendum)
Increase propranolol to 20mg  twice daily (start 10mg  in am and 20mg  in pm for 1-2 weeks then increase to 20mg  twice daily)  We will update MRI  Follow up closely with PCP, OB and psychiatry as advised   Please work on healthy lifestyle habits with well balanced diet, adequate hydration and regular exercise.   Please seek medical attention immediately for any worsening suicidal ideations.    Follow up in 3 months     Suicidal Feelings: How to Help Yourself Suicide is when you end your own life. There are many things you can do to help yourself feel better when struggling with these feelings. Many services and people are available to support you and others who struggle with similar feelings.  If you ever feel like you may hurt yourself or others, or have thoughts about taking your own life, get help right away. To get help:  Call your local emergency services (911 in the [place]  [facility] helpline (211 in the [place]  Go to your nearest emergency department.  Call a suicide hotline to speak with a trained counselor. The following suicide hotlines are available in the States: ? 1-800-273-TALK (727) 642-6297). ? 1-800-SUICIDE 470 522 2756). ? 424 362 4826. This is a hotline for Spanish speakers. ? 443-426-0259. This is a hotline for TTY users. ? 1-866-4-U-TREVOR (854)606-4944). This is a hotline for lesbian, gay, bisexual, transgender, or questioning youth. ? For a list of hotlines in (2-956-213-0865, visit 7-846-962-9528.html  Contact a crisis center or a local suicide prevention center. To find a crisis center or suicide prevention center: ? Call your local hospital, clinic, community service organization, mental health center, social service provider, or health department. Ask for help with connecting to a crisis center. ? For a list of crisis centers in the 4-132-440-1027, visit:  suicidepreventionlifeline.org ? For a list of crisis centers in (2-536-644-0347, visit: suicideprevention.ca How to help yourself feel better   Promise yourself that you will not do anything extreme when you have suicidal feelings. Remember, there is hope. Many people have gotten through suicidal thoughts and feelings, and you can too. If you have had these feelings before, remind yourself that you can get through them again.  Let family, friends, teachers, or counselors know how you are feeling. Try not to separate yourself from those who care about you and want to help you. Talk with someone every day, even if you do not feel sociable. Face-to-face conversation is best to help them understand your feelings.  Contact a mental health care provider and work with this person regularly.  Make a safety plan that you can follow during a crisis. Include phone numbers of suicide prevention hotlines, mental health professionals, and trusted friends and family members you can call during an emergency. Save these numbers on your phone.  If you are thinking of taking a lot of medicine, give your medicine to someone who can give it to you as prescribed. If you are on antidepressants and are concerned you will overdose, tell your health care provider so that he or she can give you safer medicines.  Try to stick to your routines. Follow a schedule every day. Make self-care a priority.  Make a list of realistic goals, and cross them off when you achieve them. Accomplishments can give you a sense of worth.  Wait until you are feeling better before doing things that you find difficult or unpleasant.  Do things that you have always enjoyed to take your  mind off your feelings. Try reading a book, or listening to or playing music. Spending time outside, in nature, may help you feel better. Follow these instructions at home:   Visit your primary health care provider every year for a checkup.  Work with a mental health  care provider as needed.  Eat a well-balanced diet, and eat regular meals.  Get plenty of rest.  Exercise if you are able. Just 30 minutes of exercise each day can help you feel better.  Take over-the-counter and prescription medicines only as told by your health care provider. Ask your mental health care provider about the possible side effects of any medicines you are taking.  Do not use alcohol or drugs, and remove these substances from your home.  Remove weapons, poisons, knives, and other deadly items from your home. General recommendations  Keep your living space well lit.  When you are feeling well, write yourself a letter with tips and support that you can read when you are not feeling well.  Remember that life's difficulties can be sorted out with help. Conditions can be treated, and you can learn behaviors and ways of thinking that will help you. Where to find more information  National Suicide Prevention Lifeline: www.suicidepreventionlifeline.org  Hopeline: www.hopeline.[place] for Suicide Prevention: PromotionalLoans.co.za  [employer] (for lesbian, gay, bisexual, transgender, or questioning youth): www.thetrevorproject.org Contact a health care provider if:  You feel as though you are a burden to others.  You feel agitated, angry, vengeful, or have extreme mood swings.  You have withdrawn from family and friends. Get help right away if:  You are talking about suicide or wishing to die.  You start making plans for how to commit suicide.  You feel that you have no reason to live.  You start making plans for putting your affairs in order, saying goodbye, or giving your possessions away.  You feel guilt, shame, or unbearable pain, and it seems like there is no way out.  You are frequently using drugs or alcohol.  You are engaging in risky behaviors that could lead to death. If you have any of these symptoms, get help right away. Call  emergency services, go to your nearest emergency department or crisis center, or call a suicide crisis helpline. Summary  Suicide is when you take your own life.  Promise yourself that you will not do anything extreme when you have suicidal feelings.  Let family, friends, teachers, or counselors know how you are feeling.  Get help right away if you feel as though life is getting too tough to handle and you are thinking about suicide. This information is not intended to replace advice given to you by your health care provider. Make sure you discuss any questions you have with your health care provider. Document Revised: 05/14/2018 Document Reviewed: 09/03/2016 Elsevier Patient Education  [place].    Migraine Headache A migraine headache is a very strong throbbing pain on one side or both sides of your head. This type of headache can also cause other symptoms. It can last from 4 hours to 3 days. Talk with your doctor about what things may bring on (trigger) this condition. What are the causes? The exact cause of this condition is not known. This condition may be triggered or caused by:  Drinking alcohol.  Smoking.  Taking medicines, such as: ? Medicine used to treat chest pain (nitroglycerin). ? Birth control pills. ? Estrogen. ? Some blood pressure medicines.  Eating or drinking certain products.  Doing physical activity. Other things that may trigger a migraine headache include:  Having a menstrual period.  Pregnancy.  Hunger.  Stress.  Not getting enough sleep or getting too much sleep.  Weather changes.  Tiredness (fatigue). What increases the risk?  Being 57-73 years old.  Being female.  Having a family history of migraine headaches.  Being Caucasian.  Having depression or anxiety.  Being very overweight. What are the signs or symptoms?  A throbbing pain. This pain may: ? Happen in any area of the head, such as on one side or both  sides. ? Make it hard to do daily activities. ? Get worse with physical activity. ? Get worse around bright lights or loud noises.  Other symptoms may include: ? Feeling sick to your stomach (nauseous). ? Vomiting. ? Dizziness. ? Being sensitive to bright lights, loud noises, or smells.  Before you get a migraine headache, you may get warning signs (an aura). An aura may include: ? Seeing flashing lights or having blind spots. ? Seeing bright spots, halos, or zigzag lines. ? Having tunnel vision or blurred vision. ? Having numbness or a tingling feeling. ? Having trouble talking. ? Having weak muscles.  Some people have symptoms after a migraine headache (postdromal phase), such as: ? Tiredness. ? Trouble thinking (concentrating). How is this treated?  Taking medicines that: ? Relieve pain. ? Relieve the feeling of being sick to your stomach. ? Prevent migraine headaches.  Treatment may also include: ? Having acupuncture. ? Avoiding foods that bring on migraine headaches. ? Learning ways to control your body functions (biofeedback). ? Therapy to help you know and deal with negative thoughts (cognitive behavioral therapy). Follow these instructions at home: Medicines  Take over-the-counter and prescription medicines only as told by your doctor.  Ask your doctor if the medicine prescribed to you: ? Requires you to avoid driving or using heavy machinery. ? Can cause trouble pooping (constipation). You may need to take these steps to prevent or treat trouble pooping:  Drink enough fluid to keep your pee (urine) pale yellow.  Take over-the-counter or prescription medicines.  Eat foods that are high in fiber. These include beans, whole grains, and fresh fruits and vegetables.  Limit foods that are high in fat and sugar. These include fried or sweet foods. Lifestyle  Do not drink alcohol.  Do not use any products that contain nicotine or tobacco, such as cigarettes,  e-cigarettes, and chewing tobacco. If you need help quitting, ask your doctor.  Get at least 8 hours of sleep every night.  Limit and deal with stress. General instructions      Keep a journal to find out what may bring on your migraine headaches. For example, write down: ? What you eat and drink. ? How much sleep you get. ? Any change in what you eat or drink. ? Any change in your medicines.  If you have a migraine headache: ? Avoid things that make your symptoms worse, such as bright lights. ? It may help to lie down in a dark, quiet room. ? Do not drive or use heavy machinery. ? Ask your doctor what activities are safe for you.  Keep all follow-up visits as told by your doctor. This is important. Contact a doctor if:  You get a migraine headache that is different or worse than others you have had.  You have more than 15 headache days in one month. Get help right away  if:  Your migraine headache gets very bad.  Your migraine headache lasts longer than 72 hours.  You have a fever.  You have a stiff neck.  You have trouble seeing.  Your muscles feel weak or like you cannot control them.  You start to lose your balance a lot.  You start to have trouble walking.  You pass out (faint).  You have a seizure. Summary  A migraine headache is a very strong throbbing pain on one side or both sides of your head. These headaches can also cause other symptoms.  This condition may be treated with medicines and changes to your lifestyle.  Keep a journal to find out what may bring on your migraine headaches.  Contact a doctor if you get a migraine headache that is different or worse than others you have had.  Contact your doctor if you have more than 15 headache days in a month. This information is not intended to replace advice given to you by your health care provider. Make sure you discuss any questions you have with your health care provider. Document Revised:  05/15/2018 Document Reviewed: 03/05/2018 Elsevier Patient Education  2020 [employer].

## 2019-03-17 NOTE — Progress Notes (Signed)
PATIENT: Kristen [name] DOB: 1985/02/12  REASON FOR VISIT: follow up HISTORY FROM: patient  No chief complaint on file.    HISTORY OF PRESENT ILLNESS: Today 03/17/19 Kristen [name] is a 34 y.o. female here today for follow up for migraines. We started propranolol 77m twice daily in 09/2018. She does wake up occasionally with headaches but most occur at night. She has at least three migrainous headaches weekly. Usually left sided retro orbital pressure. She is sensitive to light and sound, occasionally nauseated. She has noted "free floating colors" before headache start. She feels disoriented and has brain fog with migraine. She has seen PCP recently who was concerned of previous MRI results. 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 Repeat MRI in 10/2016 was stable.   She reports using CPAP on most nights. She usually gets 4 hours each night. She usually goes to bed around 11:30 and wakes around 6am. She always feels tired. She is working full time. She feels that if she is not at work she needs to nap. She has brain fog all the time.  Compliance report dated 02/14/2019 through 03/15/2019 reveals that she used CPAP 24 of the last 30 days for compliance of 80%.  She is CPAP greater than 4 hours 24 out of the last 30 days for compliance of 80%.  Average usage was 7 hours and 47 minutes.  Residual AHI was 0.8 on 4 to 10 cm of pressure and an EPR of 3.  There was no significant leak noted.  She is having irregular menstrual cycles. She was diagnosed with PCOS in 2018. She was started on metformin in 2018 and stopped when she lost insurance. She recently restarted on metformin [id number] but had nausea and diarrhea for about 1-2 months. She has not had a menstrual cycle since 12/2018. She was seen last week by OB/GYN and started on Femara/Provera to stimulate cycle. She is hoping to conceive. She was also given letrozole to induce ovulation.   She does admit that she has  significant anxiety and depression. [facility] a [facility]. When she was younger she had multiple attempts of suicide. Never hospitalized. No attempts since teenage years. She does still have thoughts of committing suicide.  She does not have a plan.  She often thinks of driving off a bridge. She lives at home but spends a lot of time with her fiance. Both parents are disabled. She feels that she has been a primary caregiver to her parents since early childhood. Dad has bipolar disorder and paranoid schizophrenia. Mom has Chiari malformation and fibromyalgia as well as anxiety and depression. She "buries her feelings" but will reach out to her fiance at times.   HISTORY: (copied from my note on 10/08/2018)  Kristen [name] a [age].o. female here today with concerns of worsening headaches. She reports that over the past few months she has started having headaches 10-15 days of the month. 4-5 of these are migrainous with nausea, light and sound sensitivity. Headaches are typically a tension/pressure type pain. Migraines are usually unilateral behind the eye or in the back of her head. She does have blurred vision with headache. She has taken Aleve or ibuprofen on occasion for migraine abortion. She does have sleep apnea on CPAP. She admits that she has not used CPAP in several months. She had a lapse in insurance and was not able to get supplies.  She does have a history of hypertension. She was on medication in the past but contributed her elevated readings to stress and was taken off medication. She does not check BP at home. She has not seen PCP recently. She is hoping to conceive in the near future.   HISTORY: (copied from [place] note on 07/22/2017)  Kristen [name] is a 34 year old female with a history of migraine headaches and obstructive sleep apnea. She returns today for follow-up. She has a new complaint today of memory loss.  She states that this has occurred in the last 2 to 3 weeks. She reports that she forgets things that she is immediately told. She reports that if she does not write it down she will not remember. She reports that she can be in the middle of conversation and will forget what words she is searching for. She lives at home with her mother. Reports that she manages her own finances. Able to prepare her own meals.Able to complete all ADLs independently. No trouble operating a motor vehicle. She reports that she does get stressed very easily. Reports that she has had anxiety since she was a child. She reports that her headaches are manageable. She states that she has approximate 1 migraine a week. She reports a daily dull headache. She reports that the lengthof these headaches vary. She is currently trying to conceive therefore she is not on any medication for her headaches. Patient also reports staring events. She reports that these have been witnessed by her mother and significant other. Her mother reports that she will be staring right at her but will not respond. The patient reports that she uses a CPAP. Her CPAP download indicates that she use her machine 21 out of 30 days for compliance of 70%. She use her machine greater than 4 hours 14 out of 30 days for compliance of 47%. This indicates suboptimal compliance. On average she uses the machine 5 hours and 24 minutes. She is on AutoSet 4 to 10 cm of water with EPR 3. Her residual AHI is 1.1. She does not have a significant leak. She returns today for an evaluation.  HISTORY12/18/18 Kristen [name] is a 35 year old female with a history of migraine headaches and obstructive sleep apnea. She returns today for an evaluation. She reports that she stopped Topamax because she is interested in getting pregnant. She reports that her headache frequency has actually decreased. Reports that she was having approximately 3 headaches a week and now  she is only having one headache a week. She states that when she does get a headache it typically occurs in the center of the forehead. She does report photophobia but no phonophobia. She denies nausea and vomiting. She denies any significant changes with her vision. Reports on occasion she will have some blurred or double vision however she states that this is usually brief. She states that she typically can take naproxen and lay down and her headache will resolve in approximately 2 hours. The patient was diagnosed with mild to moderate sleep apnea. CPAP therapy was suggested. However the patient has not yet started using the CPAP. She returns today for an evaluation.   REVIEW OF SYSTEMS: Out of a complete 14 system review of symptoms, the patient complains only of the following symptoms, dizziness, headaches, speech difficulty, weakness and all other reviewed systems are negative.  Epworth sleepiness scale: 13 Fatigue severity scale: 62 PHQ 9: 24 GAD-7: 15  ALLERGIES: Allergies  Allergen Reactions  . Mucinex [Guaifenesin  Er] Hives, Itching and Swelling  . Penicillins Swelling  . Contrast Media [Iodinated Diagnostic Agents] Nausea And Vomiting  . Multihance [Gadobenate] Nausea And Vomiting    HOME MEDICATIONS: Outpatient Medications Prior to Visit  Medication Sig Dispense Refill  . albuterol (PROVENTIL) (2.5 MG/3ML) 0.083% nebulizer solution Take 3 mLs (2.5 mg total) every 6 (six) hours as needed by nebulization for wheezing or shortness of breath. 50 vial 5  . albuterol (VENTOLIN HFA) 108 (90 Base) MCG/ACT inhaler Inhale 2 puffs into the lungs every 6 (six) hours as needed for wheezing or shortness of breath. 6.7 g 3  . FLUoxetine (PROZAC) 10 MG capsule Take 1 capsule (10 mg total) by mouth daily. 30 capsule 1  . letrozole (FEMARA) 2.5 MG tablet Take 1 tablet (2.5 mg total) by mouth daily. Take on days 3 to 7 following a spontaneous menses or progestin-induced bleed. (Patient not  taking: Reported on 03/12/2019) 5 tablet 2  . medroxyPROGESTERone (PROVERA) 10 MG tablet Take 1 tablet (10 mg total) by mouth daily. Use for ten days (Patient not taking: Reported on 03/12/2019) 10 tablet 3  . metFORMIN (GLUCOPHAGE) 500 MG tablet Take 1 tablet (500 mg total) by mouth 2 (two) times daily with a meal. (Patient not taking: Reported on 03/10/2019) 60 tablet 1  . naproxen (NAPROSYN) 500 MG tablet Take 1 tablet (500 mg total) by mouth 2 (two) times daily with a meal. As needed for pain 30 tablet 2  . propranolol (INDERAL) 10 MG tablet Take 1 tablet (10 mg total) by mouth 2 (two) times daily. 60 tablet 5   No facility-administered medications prior to visit.    PAST MEDICAL HISTORY: Past Medical History:  Diagnosis Date  . Anxiety 2013   treated at [facility] by Pauline Good   . Asthma 1989    no hospitalization, or intubation, previously on advair and singulair. asthma worsening.   . 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 Depression 2001   depressed since childhood   . 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 Essential hypertension 03/12/2019  . Kyphosis of thoracic region 2004    painful, runs in dad side   . Migraine   . Pancreatitis 2003   gallstone pancreatitis   . Pancreatitis due to biliary obstruction 2003  . PCOS (polycystic ovarian syndrome) 2014   facial hair, irregular periods, never been pregnant     PAST SURGICAL HISTORY: Past Surgical History:  Procedure Laterality Date  . CHOLECYSTECTOMY  2003   . OPEN REDUCTION INTERNAL FIXATION (ORIF) PROXIMAL PHALANX Right 07/23/2013   Procedure: OPEN TREATMENT RIGHT RING FINGER, PROXIMAL INTERPHALANGEAL/JOINT FRACTURE DISLOCATION;  Surgeon: Jolyn Nap, MD;  Location: [place];  Service: Orthopedics;  Laterality: Right;  . TONSILLECTOMY      FAMILY HISTORY: Family History  Problem Relation Age of Onset  . Hypertension Mother   . Arnold-Chiari malformation Mother   . Restless legs syndrome  Mother   . Osteoporosis Mother   . COPD Mother   . Asthma Mother   . Squamous cell carcinoma Mother        skin   . Eczema Mother   . Arthritis Mother   . Peripheral Artery Disease Mother   . Hypertension Father   . Scoliosis Father   . Bipolar disorder Father   . Congestive Heart Failure Father   . COPD Father   . Diabetes Maternal Grandmother   . Hypertension Maternal Grandmother   .  Cancer Paternal Grandmother        colon  . Bone cancer Maternal Grandfather 82       bone marrow   . Multiple myeloma Maternal Grandfather   . Diabetes Paternal Grandfather     SOCIAL HISTORY: Social History   Socioeconomic History  . Marital status: Single    Spouse name: Not on file  . Number of children: 0  . Years of education: college   . Highest education level: Not on file  Occupational History  . Occupation: Unemployed   Tobacco Use  . Smoking status: Never Smoker  . Smokeless tobacco: Never Used  Substance and Sexual Activity  . Alcohol use: No  . Drug use: No  . Sexual activity: Yes    Birth control/protection: None  Other Topics Concern  . Not on file  Social History Narrative   Lives with mom [name])    Right-handed   Caffeine: 3 cups of coffee per day   Social Determinants of Health   Financial Resource Strain:   . Difficulty of Paying Living Expenses: Not on file  Food Insecurity:   . Worried About Charity fundraiser in the Last Year: Not on file  . Ran Out of Food in the Last Year: Not on file  Transportation Needs:   . Lack of Transportation (Medical): Not on file  . Lack of Transportation (Non-Medical): Not on file  Physical Activity:   . Days of Exercise per Week: Not on file  . Minutes of Exercise per Session: Not on file  Stress:   . Feeling of Stress : Not on file  Social Connections:   . Frequency of Communication with Friends and Family: Not on file  . Frequency of Social Gatherings with Friends and Family: Not on file  . Attends Religious  Services: Not on file  . Active Member of Clubs or Organizations: Not on file  . Attends Archivist Meetings: Not on file  . Marital Status: Not on file  Intimate Partner Violence:   . Fear of Current or Ex-Partner: Not on file  . Emotionally Abused: Not on file  . Physically Abused: Not on file  . Sexually Abused: Not on file      PHYSICAL EXAM  Vitals:   03/17/19 1558  BP: (!) 141/86  Pulse: 72   There is no height or weight on file to calculate BMI.  Generalized: Well developed, in no acute distress  Cardiology: normal rate and rhythm, no murmur noted Respiratory: Clear to auscultation bilaterally Neurological examination  Mentation: Alert oriented to time, place, history taking. Follows all commands speech and language fluent Cranial nerve II-XII: Pupils were equal round reactive to light. Extraocular movements were full, visual field were full on confrontational test. Facial sensation and strength were normal. Uvula tongue midline. Head turning and shoulder shrug  were normal and symmetric. Motor: The motor testing reveals 5 over 5 strength of all 4 extremities. Good symmetric motor tone is noted throughout.  Sensory: Sensory testing is intact to soft touch on all 4 extremities. No evidence of extinction is noted.  Coordination: Cerebellar testing reveals good finger-nose-finger and heel-to-shin bilaterally.  Gait and station: Gait is normal.   DIAGNOSTIC DATA (LABS, IMAGING, TESTING) - I reviewed patient records, labs, notes, testing and imaging myself where available.  No flowsheet data found.   Lab Results  Component Value Date   WBC 10.0 07/22/2017   HGB 13.7 07/22/2017   HCT 41.3 07/22/2017   MCV  88 07/22/2017   PLT 267 07/22/2017      Component Value Date/Time   NA 141 07/22/2017 1339   K 4.2 07/22/2017 1339   CL 102 07/22/2017 1339   CO2 23 07/22/2017 1339   GLUCOSE 91 07/22/2017 1339   GLUCOSE 100 (H) 09/18/2016 1418   BUN 9 07/22/2017 1339    CREATININE 0.92 07/22/2017 1339   CREATININE 1.09 03/25/2014 1730   CALCIUM 9.5 07/22/2017 1339   PROT 6.4 07/22/2017 1339   ALBUMIN 4.2 07/22/2017 1339   AST 16 07/22/2017 1339   ALT 21 07/22/2017 1339   ALKPHOS 118 (H) 07/22/2017 1339   BILITOT 0.4 07/22/2017 1339   GFRNONAA 84 07/22/2017 1339   GFRNONAA 70 03/25/2014 1730   GFRAA 97 07/22/2017 1339   GFRAA 80 03/25/2014 1730   No results found for: CHOL, HDL, LDLCALC, LDLDIRECT, TRIG, CHOLHDL Lab Results  Component Value Date   HGBA1C 5.0 04/09/2016   Lab Results  Component Value Date   SLHTDSKA76 811 07/22/2017   Lab Results  Component Value Date   TSH 2.140 07/22/2017     ASSESSMENT AND PLAN 34 y.o. year old female  has a past medical history of Anxiety (2013), Asthma (1989 ), Congenital third kidney (1989 ), Depression (2001), Dysrhythmia (2010), Essential hypertension (03/12/2019), Kyphosis of thoracic region (2004 ), Migraine, Pancreatitis (2003), Pancreatitis due to biliary obstruction (2003), and PCOS (polycystic ovarian syndrome) (2014). 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 OSA on CPAP  G47.33    Z99.89   3. Chronic migraine with aura  G43.109   4. Anxiety and depression  F41.9    F32.9     She continues to struggle with persistent headaches and frequent migrainous features.  Compliance report reveals optimal compliance with CPAP.  She was encouraged to use CPAP nightly and greater than 4 hours each night.  We have discussed multiple factors contributing to headaches including OSA, and adequate sleep hygiene, Lymphocytic hyphositis, PCOS, and severe anxiety/depression.  She was encouraged to continue plans as directed with OB/GYN, PCP and psychiatry.  We will update MRI today to ensure stability and no new intracranial abnormalities.  We will increase propranolol to 20 mg twice daily.  She was advised to take 10 mg in the morning and 20 mg in the evening for  1 to 2 weeks then increase to 20 mg twice daily.  We have discussed concerns of adding additional medications for headaches due to the potential for pregnancy.  She is not currently on birth control and wishes to conceive.  Most headache preventative medications are not recommended in pregnancy.  We have discussed complementary treatments as well as lifestyle recommendations.  I have advised that she work on a healthy, well-balanced diet, adequate hydration and regular exercise.  We have discussed concerns of suicidal thoughts.  She currently does not have a plan.  She is scheduled to see psychiatry in March.  She agrees via Passenger transport manager to notify her provider or seek medical emergency attention immediately should these thoughts worsen or she develop a plan.  She will follow-up in 3 months, sooner if needed.  She verbalizes understanding and agreement with plan.     Orders Placed This Encounter  Procedures  . MR BRAIN W WO CONTRAST    Standing Status:   Future    Standing Expiration Date:   05/14/2020    Order Specific Question:  If indicated for the ordered procedure, I authorize the administration of contrast media per Radiology protocol    Answer:   Yes    Order Specific Question:   What is the patient's sedation requirement?    Answer:   No Sedation    Order Specific Question:   Does the patient have a pacemaker or implanted devices?    Answer:   No    Order Specific Question:   Radiology Contrast Protocol - do NOT remove file path    Answer:   [email or web address]    Order Specific Question:   Preferred imaging location?    Answer:   Internal     Meds ordered this encounter  Medications  . propranolol (INDERAL) 10 MG tablet    Sig: Take 2 tablets (20 mg total) by mouth 2 (two) times daily.    Dispense:  60 tablet    Refill:  5    Order Specific Question:   Supervising Provider    Answer:   Melvenia Beam [id number]      I spent 60 minutes with the  patient. 50% of this time was spent counseling and educating patient on plan of care and medications.    Debbora Presto, FNP-C 03/17/2019, 4:13 PM [facility] [address] [place], [place] [address] [phone]  Made any corrections needed, and agree with history, physical, neuro exam,assessment and plan as stated.     Sarina Ill, MD [facility]

## 2019-03-19 ENCOUNTER — Ambulatory Visit (INDEPENDENT_AMBULATORY_CARE_PROVIDER_SITE_OTHER): Payer: No Typology Code available for payment source | Admitting: Clinical

## 2019-03-19 DIAGNOSIS — Z658 Other specified problems related to psychosocial circumstances: Secondary | ICD-10-CM

## 2019-03-19 DIAGNOSIS — F332 Major depressive disorder, recurrent severe without psychotic features: Secondary | ICD-10-CM | POA: Diagnosis not present

## 2019-03-19 DIAGNOSIS — F419 Anxiety disorder, unspecified: Secondary | ICD-10-CM | POA: Diagnosis not present

## 2019-03-19 NOTE — Patient Instructions (Signed)
Behavioral Health Resources:   What if I or someone I know is in crisis?  . If you are thinking about harming yourself or having thoughts of suicide, or if you know someone who is, seek help right away.  . Call your doctor or mental health care provider.  . Call 911 or go to a hospital emergency room to get immediate help, or ask a friend or family member to help you do these things.  . Call the [place] National Suicide Prevention Lifeline's toll-free, 24-hour hotline at 1-800-273-TALK (1-800-273-8255) or TTY: 1-800-799-4 TTY (1-800-799-4889) to talk to a trained counselor.  . If you are in crisis, make sure you are not left alone.   . If someone else is in crisis, make sure he or she is not left alone   24 Hour :   [place] National Suicide Hotline: 1-800-273-8255  Therapeutic Alternative Mobile Crisis: 1-877-626-1772   [facility]  [address], [place], [place] [address]  800-711-2635 or 336-832-9700  [facility] Crisis Line (Domestic Violence, Rape & Victim Assistance)  336-273-7273  [facility]  [address] [place], [place]  [address]   1-855-788-8787 or 336-676-6840   [facility] [place] [facility]: 336-899-1505 (8am-4pm) or 1-866- 261-5769 (after hours)           [facility], [address], [place], [place]  1-800-711-2635 Fax: 336-832-9701 [email or web address]  *Interpreters available *Accepts Medicaid, Medicare, uninsured  [facility]   Mon-Fri: 8am-5pm [address], [place], [place] [phone](phone); 336-272-9885(fax) www.[email or web address]  *Accepts Medicare  [facility] Mon, Tues, Thurs, Fri: 8am-4pm [address], [place], [place]  [phone] (phone); 336-256-0121 (fax) www.[email or web address]  *Accepts Medicare  [facility] Mon-Fri: 9am-5pm  [address], [place], [place] 336-540-9400 (phone); 336-540-9454  www.[email or web address]  *Accepts Medicaid  [facility] [address], [place], [place]  336-271-5888  [email or web address]://[email or web address]   [facility] Mon-Fri, 8:30am-12pm/1pm-2:30pm [address], [place], [place] 336-387-6161 (phone); 336-387-9167 (fax) www.[facility]sp[facility].org  *Accepts Medicaid, sliding-scale*Bilingual services available  [facility] Mon-Fri, 8am-7pm [address], [place], [place]  336-899-8800(phone); 336-899-8811(fax) www.[email or web address]  *Accepts Medicaid *Bilingual services available  [facility] Mon-Fri: 8am-5pm, Saturday by appointment only [address], [place], [place] [phone] (phone); [phone] (fax) www.[email or web address]   [facility] [address], [place], [place] 336-429-5600 www.[email or web address]  *Free & reduced services for uninsured and underinsured individuals *Bilingual services for Spanish-speaking clients 21 and under  [facility], [address], [place], [place] 336-676-6409(phone); 336-676-6409(fax) www.[facility].org  *Bring your own interpreter at first visit *Accepts Medicare and Medicaid  [facility] Mon-Fri: 9am-5:30pm [address], [place], [place] [phone] (phone), [phone] (fax) After hours crisis line: 336-763-1165 www.[email or web address]  *Accepts Medicare and Medicaid  [facility] Mon-Thurs, 8am-6pm [address], [place], [place]  [phone] (phone); 336-288-0738 (fax) http://[email or web address]  *Subsidized costs available  [facility] Mon-Fri: 8am-4pm [address], [place],  [place] [phone](phone); [phone](fax) www.[email or web address]  *Accepts Medicaid  [facility] [place] [facility] Same day access hours: Mon-Fri, 8:30-3pm Crisis hours: Mon-Fri, 8am-5pm [address], [place], [place]  [facility] Same day access hours: Mon-Fri, 8:30-3pm Crisis hours: Mon-Fri, 8am-8pm [address], [place], [place] [phone] (phone); [phone] (fax) www.[facility].org  *Accepts Medicaid and Medicare  [facility] Mon, Wed, Fri: 9am-9pm Tues, Thurs: 9am-6pm [address],   [place], [place]  [phone] (phone); [phone] (fax) https://[email or web address]  *(Accepts Medicare and Medicaid; payment plans available)*Bilingual services available  [facility] [address], [place], [place] 336-272-1182 (phone); 336-272-1182 (fax) www.[email or web address]   [facility] [address], [place], [place]  [phone]  www.[email or web address]  *Bilingual services available  [facility] Mon-Thurs: 8am-8pm [address], [place], [place] [phone] (phone); [phone] (fax) [email or web address]://[email or web address]  *Accepts Medicaid*Bilingual services available  [facility] [address], [place], [place] [phone] (phone) [email or web address]://[email or web address]  *Accepts Medicaid *Bilingual services available  [facility] Mon-Fri, 9am-4:45pm [address], [place], [place] [phone] (phone); [phone] (fax) http://[email or web address]-[email or web address]  *Accepts Medicare  [facility] Mon-Thurs: 8:30-8pm, Fri: 8:30am-7pm [address], [place], [place] (3rd floor) [phone] (phone); [phone] (fax) [email or web address]://[email or web address]  *Accepts Medicaid; income-based reduced rates available  [facility] Mon-Fri: 8am-5pm [address], [place],  [place] [phone] (phone); [phone] (fax) http://www.[email or web address]  *Accepts Medicaid*Bilingual services available  [facility] [address], [place], [place]  [phone] (phone); [phone] (fax) www.[facility].org  *Free emergency housing and clinical services for youth in crisis  [facility] [place]  [address], [place] [phone] www.mhag.org  *Provides direct services to individuals in recovery from mental illness, including support groups, recovery skills classes, and one on one peer support  NAMI (National Alliance on Mental Illness) [place] NAMI helpline: 336-370-4264  https://namiguilford.org  *A community hub for information relating to local resources and services for the friends and families of individuals living alongside a mental health condition, as well as the individuals themselves. Classes and support groups also provided   /Emotional Wellbeing Apps and Websites Here are a few free apps meant to help you to help yourself.  To find, try searching on the internet to see if the app is offered on Apple/Android devices. If your first choice doesn't come up on your device, the good news is that there are many choices! Play around with different apps to see which ones are helpful to you.    Calm This is an app meant to help increase calm feelings. Includes info, strategies, and tools for tracking your feelings.      Calm Harm  This app is meant to help with self-harm. Provides many 5-minute or 15-min coping strategies for doing instead of hurting yourself.       Healthy Minds Health Minds is a problem-solving tool to help deal with emotions and cope with stress you encounter wherever you are.      MindShift This app can help people cope with anxiety. Rather than trying to avoid anxiety, you can make an important shift and face it.      MY3  MY3 features a support system, safety plan and  resources with the goal of offering a tool to use in a time of need.       My Life My Voice  This mood journal offers a simple solution for tracking your thoughts, feelings and moods. Animated emoticons can help identify your mood.       Relax Melodies Designed to help with sleep, on this app you can mix sounds and meditations for relaxation.      Smiling Mind Smiling Mind is meditation made easy: it's a simple tool that helps put a smile on your mind.        Stop, Breathe & Think  A friendly, simple guide for people through meditations for mindfulness and compassion.  Stop,   Breathe and Think Kids Enter your current feelings and choose a "mission" to help you cope. Offers videos for certain moods instead of just sound recordings.       Team Orange The goal of this tool is to help teens change how they think, act, and react. This app helps you focus on your own good feelings and experiences.      The Virtual Hope Box The Virtual Hope Box (VHB) contains simple tools to help patients with coping, relaxation, distraction, and positive thinking.      

## 2019-03-20 MED FILL — PROPRANOLOL 10 MG TABLET: 10 | 30 days supply | Qty: 120 | Fill #1

## 2019-03-29 ENCOUNTER — Telehealth: Payer: Self-pay | Admitting: Family Medicine

## 2019-03-29 NOTE — Telephone Encounter (Signed)
States if you are able to contact member ask for patient to call insurance to place a referral in order to save some money

## 2019-03-29 NOTE — Telephone Encounter (Signed)
MR Brain w/wo contrast [name] L[facility] Berkley Harvey: [phone] (exp. 03/31/19 to 04/30/19). Patient is scheduled for 03/31/19 at [facility].

## 2019-03-29 NOTE — Telephone Encounter (Signed)
Noted, informed patient

## 2019-03-31 ENCOUNTER — Ambulatory Visit (INDEPENDENT_AMBULATORY_CARE_PROVIDER_SITE_OTHER): Payer: No Typology Code available for payment source

## 2019-03-31 ENCOUNTER — Other Ambulatory Visit: Payer: Self-pay | Admitting: Family Medicine

## 2019-03-31 ENCOUNTER — Other Ambulatory Visit: Payer: Self-pay

## 2019-03-31 DIAGNOSIS — E230 Hypopituitarism: Secondary | ICD-10-CM | POA: Diagnosis not present

## 2019-04-01 NOTE — BH Specialist Note (Deleted)
Integrated Behavioral Health via Telemedicine Video Visit  04/01/2019 Kristen [name] [id number]  Number of Integrated Behavioral Health visits: 2 Session Start time: 8:15***  Session End time: 8:45*** Total time: {IBH Total WUJW:11914782}  Referring Provider: Jaynie Collins, MD Type of Visit: Video Patient/Family location: Home [name] Provider location: [facility] All persons participating in visit: Patient *** and [name] ***   Confirmed patient's address: Yes  Confirmed patient's phone number: Yes  Any changes to demographics: No   Confirmed patient's insurance: Yes  Any changes to patient's insurance: No   Discussed confidentiality: at previous visit  I connected with [name]  by a video enabled telemedicine application and verified that I am speaking with the correct person using two identifiers.     I discussed the limitations of evaluation and management by telemedicine and the availability of in person appointments.  I discussed that the purpose of this visit is to provide behavioral health care while limiting exposure to the novel coronavirus.   Discussed there is a possibility of technology failure and discussed alternative modes of communication if that failure occurs.  I discussed that engaging in this video visit, they consent to the provision of behavioral healthcare and the services will be billed under their insurance.  Patient and/or legal guardian expressed understanding and consented to video visit: Yes   PRESENTING CONCERNS: Patient and/or family reports the following symptoms/concerns: *** Duration of problem: ***; Severity of problem: {Mild/Moderate/Severe:20260}  STRENGTHS (Protective Factors/Coping Skills): ***  GOALS ADDRESSED: Patient will: 1.  Reduce symptoms of: {IBH Symptoms:21014056}  2.  Increase knowledge and/or ability of: {IBH Patient Tools:21014057}  3.  Demonstrate ability to: {IBH  Goals:21014053}  INTERVENTIONS: Interventions utilized:  {IBH Interventions:21014054} Standardized Assessments completed: {IBH Screening Tools:21014051}  ASSESSMENT: Patient currently experiencing ***.   Patient may benefit from psychoeducation and brief therapeutic interventions regarding coping with symptoms of *** .  PLAN: 1. Follow up with behavioral health clinician on : *** 2. Behavioral recommendations:  -*** -***(pyshiatry initial for fmla, prozac, worry time, calm, nami)*** 3. Referral(s): {IBH Referrals:21014055}  I discussed the assessment and treatment plan with the patient and/or parent/guardian. They were provided an opportunity to ask questions and all were answered. They agreed with the plan and demonstrated an understanding of the instructions.   They were advised to call back or seek an in-person evaluation if the symptoms worsen or if the condition fails to improve as anticipated.  [name]

## 2019-04-02 ENCOUNTER — Ambulatory Visit: Payer: No Typology Code available for payment source

## 2019-04-07 ENCOUNTER — Ambulatory Visit: Payer: No Typology Code available for payment source | Admitting: Family Medicine

## 2019-04-08 NOTE — BH Specialist Note (Signed)
Integrated Behavioral Health via Telemedicine Phone Visit  04/08/2019 [name] [id number]  Number of Integrated Behavioral Health visits: 2 Session Start time: 8:17 Session End time: 8:25 Total time: 8  Referring Provider: Jaynie Collins, MD Type of Visit: Phone Patient/Family location: Home [facility] Provider location: [facility] All persons participating in visit: Patient Kristen [name] and [facility] Lyndie Vanderloop     Confirmed patient's address: Yes  Confirmed patient's phone number: Yes  Any changes to demographics: No   Confirmed patient's insurance: Yes  Any changes to patient's insurance: No   Discussed confidentiality: at previous visit  I connected with Kristen [name]  by a video enabled telemedicine application and verified that I am speaking with the correct person using two identifiers.     I discussed the limitations of evaluation and management by telemedicine and the availability of in person appointments.  I discussed that the purpose of this visit is to provide behavioral health care while limiting exposure to the novel coronavirus.   Discussed there is a possibility of technology failure and discussed alternative modes of communication if that failure occurs.  I discussed that engaging in this video visit, they consent to the provision of behavioral healthcare and the services will be billed under their insurance.  Patient and/or legal guardian expressed understanding and consented to video visit: Yes   PRESENTING CONCERNS: Patient and/or family reports the following symptoms/concerns: Pt states her primary concern today is feeling overwhelmed at work; has been using self-coping strategies and taking Prozac as prescribed.  Duration of problem: Ongoing since childhood; Severity of problem: severe  STRENGTHS (Protective Factors/Coping Skills): Strong self-awareness, adhering to treatment  GOALS ADDRESSED: Patient will: 1.  Reduce symptoms of: anxiety,  depression and stress  2.  Increase knowledge and/or ability of: stress reduction  3.  Demonstrate ability to: Increase healthy adjustment to current life circumstances  INTERVENTIONS: Interventions utilized:  Supportive Counseling and Psychoeducation and/or Health Education Standardized Assessments completed: not given today  ASSESSMENT: Patient currently experiencing Anxiety disorder, unspecified.   Patient may benefit from continued psychoeducation and brief therapeutic interventions regarding coping with symptoms of anxiety .  PLAN: 1. Follow up with behavioral health clinician on : As needed 2. Behavioral recommendations:  -Attend initial psychiatry appointment today -Continue taking Prozac as prescribed -Continue using self-coping strategies daily (relaxation breathing twice daily; worry time strategy)  3. Referral(s): Integrated [employer] (In Clinic)  I discussed the assessment and treatment plan with the patient and/or parent/guardian. They were provided an opportunity to ask questions and all were answered. They agreed with the plan and demonstrated an understanding of the instructions.   They were advised to call back or seek an in-person evaluation if the symptoms worsen or if the condition fails to improve as anticipated.  Valetta Close Smiley Birr

## 2019-04-09 ENCOUNTER — Other Ambulatory Visit: Payer: Self-pay

## 2019-04-09 ENCOUNTER — Ambulatory Visit (INDEPENDENT_AMBULATORY_CARE_PROVIDER_SITE_OTHER): Payer: No Typology Code available for payment source | Admitting: Psychiatry

## 2019-04-09 ENCOUNTER — Ambulatory Visit (INDEPENDENT_AMBULATORY_CARE_PROVIDER_SITE_OTHER): Payer: No Typology Code available for payment source | Admitting: Clinical

## 2019-04-09 ENCOUNTER — Encounter (HOSPITAL_COMMUNITY): Payer: Self-pay | Admitting: Psychiatry

## 2019-04-09 DIAGNOSIS — F419 Anxiety disorder, unspecified: Secondary | ICD-10-CM | POA: Diagnosis not present

## 2019-04-09 DIAGNOSIS — F4010 Social phobia, unspecified: Secondary | ICD-10-CM

## 2019-04-09 DIAGNOSIS — F331 Major depressive disorder, recurrent, moderate: Secondary | ICD-10-CM | POA: Diagnosis not present

## 2019-04-09 MED ORDER — FLUOXETINE HCL 20 MG PO CAPS: 20.0000 mg | ORAL_CAPSULE | Freq: Every day | ORAL | 1 refills | Status: DC

## 2019-04-09 MED FILL — FLUoxetine HCL 20 MG CAPS: 20 | 30 days supply | Qty: 30 | Fill #0
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
## 2019-04-12 NOTE — BH Specialist Note (Signed)
Integrated Behavioral Health Initial Visit  MRN: [id number] Name: Kristen [name]  Number of Integrated Behavioral Health Clinician visits:: 1/6 Session Start time: 10:35 AM  Session End time: 11:00 AM Total time: 25  Type of Service: Integrated Behavioral Health- Individual Interpretor:No. Interpretor Name and Language: NA   Warm Hand Off Completed.       SUBJECTIVE: Kristen [name] is a 34 y.o. female accompanied by self Patient was referred by Dr. Laural Benes for depression and anxiety. Patient reports the following symptoms/concerns: Pt reports increase in stress resulting in depression and anxiety symptoms Duration of problem: Ongoing; Severity of problem: severe  OBJECTIVE: Mood: Anxious and Depressed and Affect: Tearful Risk of harm to self or others: No plan to harm self or others Pt scored positive on phq9; however, denies current SI/HI. Protective factors identified and crisis intervention resources provided  LIFE CONTEXT: Family and Social: Pt resides with both parents, who pt provides caregiving services to School/Work: Pt is employed as a Dietitian: Pt is interested in medication management and initiating therapy Life Changes: Pt reports psychosocial stressors   GOALS ADDRESSED: Patient will: 1. Reduce symptoms of: anxiety, depression and stress 2. Increase knowledge and/or ability of: coping skills and healthy habits  3. Demonstrate ability to: Increase healthy adjustment to current life circumstances, Increase adequate support systems for patient/family and Increase motivation to adhere to plan of care  INTERVENTIONS: Interventions utilized: Solution-Focused Strategies, Supportive Counseling and Psychoeducation and/or Health Education  Standardized Assessments completed: GAD-7 and PHQ 2&9 with C-SSRS  ASSESSMENT: Patient currently experiencing depression and anxiety triggered by psychosocial stressors. Pt scored positive on phq9; however, denies current  SI/HI. Protective factors identified and crisis intervention resources provided.   Patient may benefit from medication management and therapy. Referral to [facility] was completed and PCP prescribed fluoxetine. Healthy coping skills were discussed to assist in management/decrease in symptoms. Pt was encouraged to schedule an appointment with Financial Counselor to assist with medical costs.  PLAN: 1. Follow up with behavioral health clinician on : Contact LCSW with behavioral health and/or resource needs 2. Behavioral recommendations: Utilize strategies discussed in session and comply with medication managment 3. Referral(s): Integrated Art gallery manager (In Clinic) and Community Mental Health Services (LME/Outside Clinic) 4. "From scale of 1-10, how likely are you to follow plan?":   Bridgett Larsson, LCSW 04/12/2019 11:26 AM

## 2019-04-14 ENCOUNTER — Inpatient Hospital Stay (HOSPITAL_COMMUNITY)
Admission: RE | Admit: 2019-04-14 | Discharge: 2019-04-18 | DRG: 885 | Disposition: A | Discharge status: Home / Self Care | Payer: No Typology Code available for payment source | Attending: Psychiatry | Admitting: Psychiatry

## 2019-04-14 DIAGNOSIS — Z20822 Contact with and (suspected) exposure to covid-19: Secondary | ICD-10-CM | POA: Diagnosis present

## 2019-04-14 DIAGNOSIS — I10 Essential (primary) hypertension: Secondary | ICD-10-CM | POA: Diagnosis present

## 2019-04-14 DIAGNOSIS — Z8262 Family history of osteoporosis: Secondary | ICD-10-CM

## 2019-04-14 DIAGNOSIS — Z880 Allergy status to penicillin: Secondary | ICD-10-CM | POA: Diagnosis not present

## 2019-04-14 DIAGNOSIS — Z825 Family history of asthma and other chronic lower respiratory diseases: Secondary | ICD-10-CM | POA: Diagnosis not present

## 2019-04-14 DIAGNOSIS — Z915 Personal history of self-harm: Secondary | ICD-10-CM

## 2019-04-14 DIAGNOSIS — R45851 Suicidal ideations: Secondary | ICD-10-CM | POA: Diagnosis present

## 2019-04-14 DIAGNOSIS — Z8261 Family history of arthritis: Secondary | ICD-10-CM

## 2019-04-14 DIAGNOSIS — Z818 Family history of other mental and behavioral disorders: Secondary | ICD-10-CM

## 2019-04-14 DIAGNOSIS — F332 Major depressive disorder, recurrent severe without psychotic features: Secondary | ICD-10-CM | POA: Diagnosis present

## 2019-04-14 DIAGNOSIS — F329 Major depressive disorder, single episode, unspecified: Secondary | ICD-10-CM | POA: Diagnosis not present

## 2019-04-14 DIAGNOSIS — Z8249 Family history of ischemic heart disease and other diseases of the circulatory system: Secondary | ICD-10-CM

## 2019-04-14 DIAGNOSIS — G479 Sleep disorder, unspecified: Secondary | ICD-10-CM | POA: Diagnosis present

## 2019-04-14 DIAGNOSIS — E282 Polycystic ovarian syndrome: Secondary | ICD-10-CM | POA: Diagnosis present

## 2019-04-14 DIAGNOSIS — J45909 Unspecified asthma, uncomplicated: Secondary | ICD-10-CM | POA: Diagnosis present

## 2019-04-14 DIAGNOSIS — F32A Depression, unspecified: Secondary | ICD-10-CM | POA: Diagnosis present

## 2019-04-14 DIAGNOSIS — Z807 Family history of other malignant neoplasms of lymphoid, hematopoietic and related tissues: Secondary | ICD-10-CM | POA: Diagnosis not present

## 2019-04-14 DIAGNOSIS — Z91041 Radiographic dye allergy status: Secondary | ICD-10-CM | POA: Diagnosis not present

## 2019-04-14 DIAGNOSIS — R480 Dyslexia and alexia: Secondary | ICD-10-CM | POA: Diagnosis present

## 2019-04-14 DIAGNOSIS — Z833 Family history of diabetes mellitus: Secondary | ICD-10-CM | POA: Diagnosis not present

## 2019-04-14 DIAGNOSIS — Z888 Allergy status to other drugs, medicaments and biological substances status: Secondary | ICD-10-CM

## 2019-04-14 DIAGNOSIS — M40204 Unspecified kyphosis, thoracic region: Secondary | ICD-10-CM | POA: Diagnosis present

## 2019-04-14 DIAGNOSIS — F419 Anxiety disorder, unspecified: Secondary | ICD-10-CM | POA: Diagnosis not present

## 2019-04-14 DIAGNOSIS — Z79811 Long term (current) use of aromatase inhibitors: Secondary | ICD-10-CM | POA: Diagnosis not present

## 2019-04-14 DIAGNOSIS — G43909 Migraine, unspecified, not intractable, without status migrainosus: Secondary | ICD-10-CM | POA: Diagnosis present

## 2019-04-14 LAB — RESPIRATORY PANEL BY RT PCR (FLU A&B, COVID)
Influenza A by PCR: NEGATIVE
Influenza B by PCR: NEGATIVE
SARS Coronavirus 2 by RT PCR: NEGATIVE
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
## 2019-04-14 NOTE — Progress Notes (Signed)
Pt accepted to [facility] for inpatient to Room 300-02, pending negative COVID test results.
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
## 2019-04-15 ENCOUNTER — Other Ambulatory Visit: Payer: Self-pay

## 2019-04-15 ENCOUNTER — Encounter (HOSPITAL_COMMUNITY): Payer: Self-pay | Admitting: Behavioral Health

## 2019-04-15 DIAGNOSIS — F332 Major depressive disorder, recurrent severe without psychotic features: Secondary | ICD-10-CM | POA: Diagnosis present

## 2019-04-15 LAB — COMPREHENSIVE METABOLIC PANEL
ALT: 22 U/L (ref 0–44)
AST: 20 U/L (ref 15–41)
Albumin: 3.7 g/dL (ref 3.5–5.0)
Alkaline Phosphatase: 112 U/L (ref 38–126)
Anion gap: 9 (ref 5–15)
BUN: 14 mg/dL (ref 6–20)
CO2: 23 mmol/L (ref 22–32)
Calcium: 9.1 mg/dL (ref 8.9–10.3)
Chloride: 107 mmol/L (ref 98–111)
Creatinine, Ser: 0.86 mg/dL (ref 0.44–1.00)
GFR calc Af Amer: >60 mL/min (ref >60)
GFR calc non Af Amer: >60 mL/min (ref >60)
Glucose, Bld: 91 mg/dL (ref 70–99)
Potassium: 3.9 mmol/L (ref 3.5–5.1)
Sodium: 139 mmol/L (ref 135–145)
Total Bilirubin: 0.7 mg/dL (ref 0.3–1.2)
Total Protein: 6.7 g/dL (ref 6.5–8.1)

## 2019-04-15 LAB — HEPATIC FUNCTION PANEL
ALT: 22 U/L (ref 0–44)
AST: 20 U/L (ref 15–41)
Albumin: 3.8 g/dL (ref 3.5–5.0)
Alkaline Phosphatase: 111 U/L (ref 38–126)
Bilirubin, Direct: 0.1 mg/dL (ref 0.0–0.2)
Indirect Bilirubin: 0.4 mg/dL (ref 0.3–0.9)
Total Bilirubin: 0.5 mg/dL (ref 0.3–1.2)
Total Protein: 7 g/dL (ref 6.5–8.1)

## 2019-04-15 LAB — RAPID URINE DRUG SCREEN, HOSP PERFORMED
Amphetamines: NOT DETECTED
Barbiturates: NOT DETECTED
Benzodiazepines: NOT DETECTED
Cocaine: NOT DETECTED
Opiates: NOT DETECTED
Tetrahydrocannabinol: NOT DETECTED

## 2019-04-15 LAB — URINALYSIS, COMPLETE (UACMP) WITH MICROSCOPIC
Bilirubin Urine: NEGATIVE
Glucose, UA: NEGATIVE mg/dL
Hgb urine dipstick: NEGATIVE
Ketones, ur: NEGATIVE mg/dL
Leukocytes,Ua: NEGATIVE
Nitrite: NEGATIVE
Protein, ur: NEGATIVE mg/dL
Specific Gravity, Urine: 1.026 (ref 1.005–1.030)
pH: 6 (ref 5.0–8.0)

## 2019-04-15 LAB — LIPID PANEL
Cholesterol: 204 mg/dL — ABNORMAL HIGH (ref 0–200)
HDL: 41 mg/dL (ref >40)
LDL Cholesterol: 134 mg/dL — ABNORMAL HIGH (ref 0–99)
Total CHOL/HDL Ratio: 5 RATIO
Triglycerides: 146 mg/dL (ref <150)
VLDL: 29 mg/dL (ref 0–40)

## 2019-04-15 LAB — HEMOGLOBIN A1C
Hgb A1c MFr Bld: 4.9 % (ref 4.8–5.6)
Mean Plasma Glucose: 93.93 mg/dL

## 2019-04-15 LAB — TSH: TSH: 4.628 u[IU]/mL — ABNORMAL HIGH (ref 0.350–4.500)

## 2019-04-15 LAB — MAGNESIUM: Magnesium: 2.1 mg/dL (ref 1.7–2.4)

## 2019-04-15 LAB — ETHANOL: Alcohol, Ethyl (B): <10 mg/dL (ref <10)

## 2019-04-15 MED ORDER — BUSPIRONE HCL 10 MG PO TABS
10.0000 mg | ORAL_TABLET | Freq: Two times a day (BID) | ORAL | Status: DC
  Administered 2019-04-15 – 2019-04-18 (×7): 10 mg via ORAL
  Filled 2019-04-15: qty 1
  Filled 2019-04-15: qty 2
  Filled 2019-04-15 (×10): qty 1

## 2019-04-15 MED ORDER — ALBUTEROL SULFATE HFA 108 (90 BASE) MCG/ACT IN AERS: 1.0000 | INHALATION_SPRAY | Freq: Four times a day (QID) | RESPIRATORY_TRACT | Status: DC | PRN

## 2019-04-15 MED ORDER — HYDROXYZINE HCL 25 MG PO TABS
25.0000 mg | ORAL_TABLET | Freq: Three times a day (TID) | ORAL | Status: DC | PRN
  Administered 2019-04-15 – 2019-04-16 (×3): 25 mg via ORAL
  Filled 2019-04-15 (×4): qty 1

## 2019-04-15 MED ORDER — LETROZOLE 2.5 MG PO TABS
2.5000 mg | ORAL_TABLET | Freq: Every day | ORAL | Status: DC
  Filled 2019-04-15: qty 1

## 2019-04-15 MED ORDER — PROPRANOLOL HCL 20 MG PO TABS
20.0000 mg | ORAL_TABLET | Freq: Two times a day (BID) | ORAL | Status: DC
  Administered 2019-04-15 – 2019-04-18 (×7): 20 mg via ORAL
  Filled 2019-04-15 (×10): qty 1
  Filled 2019-04-15: qty 2
  Filled 2019-04-15: qty 1

## 2019-04-15 MED ORDER — TRAZODONE HCL 50 MG PO TABS
50.0000 mg | ORAL_TABLET | Freq: Every evening | ORAL | Status: DC | PRN
  Administered 2019-04-15 – 2019-04-16 (×3): 50 mg via ORAL
  Filled 2019-04-15 (×4): qty 1

## 2019-04-15 MED ORDER — NAPROXEN 500 MG PO TABS
500.0000 mg | ORAL_TABLET | Freq: Two times a day (BID) | ORAL | Status: DC
  Administered 2019-04-15 – 2019-04-18 (×7): 500 mg via ORAL
  Filled 2019-04-15 (×12): qty 1

## 2019-04-15 MED ORDER — ALUM & MAG HYDROXIDE-SIMETH 200-200-20 MG/5ML PO SUSP: 30.0000 mL | ORAL | Status: DC | PRN

## 2019-04-15 MED ORDER — MEDROXYPROGESTERONE ACETATE 10 MG PO TABS: 10.0000 mg | ORAL_TABLET | Freq: Every day | ORAL | Status: DC

## 2019-04-15 MED ORDER — ACETAMINOPHEN 325 MG PO TABS: 650.0000 mg | ORAL_TABLET | Freq: Four times a day (QID) | ORAL | Status: DC | PRN

## 2019-04-15 MED ORDER — METFORMIN HCL 500 MG PO TABS
500.0000 mg | ORAL_TABLET | Freq: Two times a day (BID) | ORAL | Status: DC
  Filled 2019-04-15 (×2): qty 1

## 2019-04-15 MED ORDER — SERTRALINE HCL 25 MG PO TABS
25.0000 mg | ORAL_TABLET | Freq: Every day | ORAL | Status: DC
  Administered 2019-04-15 – 2019-04-18 (×4): 25 mg via ORAL
  Filled 2019-04-15 (×7): qty 1

## 2019-04-15 MED ORDER — MAGNESIUM HYDROXIDE 400 MG/5ML PO SUSP: 30.0000 mL | Freq: Every day | ORAL | Status: DC | PRN

## 2019-04-15 NOTE — BHH Suicide Risk Assessment (Signed)
[facility] Admission Suicide Risk Assessment   Nursing information obtained from:  Patient Demographic factors:  Caucasian Current Mental Status:  Suicide plan Loss Factors:  NA Historical Factors:  NA Risk Reduction Factors:  Employed, Living with another person, especially a relative  Total Time spent with patient: 30 minutes Principal Problem: Anxiety and depression Diagnosis:  Principal Problem:   Anxiety and depression Active Problems:   MDD (major depressive disorder), recurrent severe, without psychosis (HCC)  Subjective Data: Patient is seen and examined.  Patient is a 34 year old female with a reported past psychiatric history significant for generalized anxiety and depression who presented as a walk-in evaluation to the behavioral health hospital on 04/14/2019.  The patient stated that she was having suicidal ideation.  She stated that she was having random breakdowns at work.  She stated she was under significant stress.  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  She previously was diagnosed with anxiety depression as a child and was treated with Zoloft.  She stated she felt as though it it worked at one time, but then "stopped working".  She started having increased stress approximately 1 to 2 months ago, and was evaluated.  She was started on fluoxetine, and this was increased to 20 mg a day.  She was seen by Dr. [name] Mustache on 04/09/2019.  Her fluoxetine was increased, and she was placed on FMLA.  They also discussed possibly getting neurocognitive testing because of her concern for underlying autism in her case.  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  She stated that all the additional work would fall on her, and she was unable to cope with that.  She discussed having crying spells, having to go to  the bathroom a great deal just to have these breakdowns.  She stated that she had previously cut when she was younger, but now "suppresses that".  She admitted to helplessness, hopelessness and worthlessness.  She was admitted to the hospital for evaluation and stabilization.  Continued Clinical Symptoms:  Alcohol Use Disorder Identification Test Final Score (AUDIT): 0 The "Alcohol Use Disorders Identification Test", Guidelines for Use in Primary Care, Second Edition.  World Science writer [facility]). Score between 0-7:  no or low risk or alcohol related problems. Score between 8-15:  moderate risk of alcohol related problems. Score between 16-19:  high risk of alcohol related problems. Score 20 or above:  warrants further diagnostic evaluation for alcohol dependence and treatment.   CLINICAL FACTORS:   Severe Anxiety and/or Agitation Depression:   Anhedonia Hopelessness Impulsivity Insomnia Previous Psychiatric Diagnoses and Treatments   Musculoskeletal: Strength & Muscle Tone: within normal limits Gait & Station: normal Patient leans: N/A  Psychiatric Specialty Exam: Physical Exam  Nursing note and vitals reviewed. Constitutional: She is oriented to person, place, and time. She appears well-developed and well-nourished.  HENT:  Head: Normocephalic and atraumatic.  Respiratory: Effort normal.  Neurological: She is alert and oriented to person, place, and time.    Review of Systems  Blood pressure (!) 118/59, pulse (!) 114, temperature 97.8 F (36.6 C), resp. rate 18, height 5' 6.5" (1.689 m), weight (!) 143.9 kg, SpO2 100 %.Body mass index is 50.45 kg/m.  General Appearance: Casual  Eye Contact:  Fair  Speech:  Normal Rate  Volume:  Decreased  Mood:  Anxious and Depressed  Affect:  Congruent  Thought Process:  Coherent and Descriptions of Associations: Intact  Orientation:  Full (Time, Place, and Person)  Thought Content:  Logical  Suicidal Thoughts:  Yes.  without  intent/plan  Homicidal Thoughts:  No  Memory:  Immediate;   Good Recent;   Good Remote;   Good  Judgement:  Intact  Insight:  Fair  Psychomotor Activity:  Increased  Concentration:  Concentration: Fair and Attention Span: Fair  Recall:  Good  Fund of Knowledge:  Good  Language:  Good  Akathisia:  Negative  Handed:  Right  AIMS (if indicated):     Assets:  Desire for Improvement Housing Resilience  ADL's:  Intact  Cognition:  WNL  Sleep:  Number of Hours: 3.25      COGNITIVE FEATURES THAT CONTRIBUTE TO RISK:  None    SUICIDE RISK:   Mild:  Suicidal ideation of limited frequency, intensity, duration, and specificity.  There are no identifiable plans, no associated intent, mild dysphoria and related symptoms, good self-control (both objective and subjective assessment), few other risk factors, and identifiable protective factors, including available and accessible social support.  PLAN OF CARE: Patient is seen and examined.  Patient is a 34 year old female with the above stated past psychiatric history who was admitted secondary to suicidal ideation, depression anxiety.  She will be admitted to the hospital.  She will be integrated into the milieu.  She will be encouraged to attend groups.  We will go on and stop the fluoxetine, and I am going to return to sertraline given its success in the past.  We will start that at 25 mg p.o. daily and titrate that.  She will also be placed on buspirone 10 mg p.o. 3 times daily, and this to be an increase during the course of the hospitalization.  She has a past medical history significant for irritable bowel syndrome diarrhea type, and this will be monitored.  She also has a reported history of asthma and albuterol will be continued.  We will also continue her propranolol as well as her Metformin. We will also explore the possibility of previous trauma given her IBS diagnosis.  After discharge she clearly needs to be in either cognitive behavioral  therapy or dialectic behavioral therapy.  Review of her laboratories showed essentially normal electrolytes, normal liver function enzymes.  Her cholesterol is mildly elevated at 204 and her LDL is elevated at 134.  Her triglycerides are elevated at 146.  Her TSH is 4.628.  Drug screen was negative.  She had an MRI of the brain done on 2/24.  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  Her EKG showed a normal sinus rhythm with an normal QTc interval.  I certify that inpatient services furnished can reasonably be expected to improve the patient's condition.   Sharma Covert, MD 04/15/2019, 8:04 AM

## 2019-04-15 NOTE — Progress Notes (Signed)
BHH Group Notes:  (Nursing/MHT/Case Management/Adjunct)  Date:  04/15/2019  Time:  2030  Type of Therapy:  wrap up group  Participation Level:  Active  Participation Quality:  Appropriate, Attentive, Sharing and Supportive  Affect:  Depressed  Cognitive:  Appropriate  Insight:  Improving  Engagement in Group:  Engaged  Modes of Intervention:  Clarification, Education and Support  Summary of Progress/Problems: Positive thinking and positive change were discussed.   Marcille Buffy 04/15/2019, 10:21 PM

## 2019-04-15 NOTE — BHH Suicide Risk Assessment (Signed)
BHH INPATIENT:  Family/Significant Other Suicide Prevention Education  Suicide Prevention Education:  Education Completed; Francille Wittmann, mother [phone])  has been identified by the patient as the family member/significant other with whom the patient will be residing, and identified as the person(s) who will aid the patient in the event of a mental health crisis (suicidal ideations/suicide attempt).  With written consent from the patient, the family member/significant other has been provided the following suicide prevention education, prior to the and/or following the discharge of the patient.  The suicide prevention education provided includes the following:  Suicide risk factors  Suicide prevention and interventions  National Suicide Hotline telephone number  [facility] assessment telephone number  [facility] Emergency Assistance 911  [facility] and/or Residential Mobile Crisis Unit telephone number  Request made of family/significant other to:  Remove weapons (e.g., guns, rifles, knives), all items previously/currently identified as safety concern.    Remove drugs/medications (over-the-counter, prescriptions, illicit drugs), all items previously/currently identified as a safety concern.  The family member/significant other verbalizes understanding of the suicide prevention education information provided.  The family member/significant other agrees to remove the items of safety concern listed above.  CSW spoke with pts mother who reported that pt is in the hospital because pt has been having a lot going on her whole life. 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 She reports that growing up pt also had pancreatitis and was born with 3 kidneys. Mother reported that pt has been showing signs of being pregnant, although she is not pregnant and has not had a period in months and  started lactating. Mother reported that all pts medical/health issues are so overwhelming for pt and she doesn't know how to handle it. Per mother pt takes care of her and her father because they are both disabled and pt also works. Mother reported there are no guns in the home and reports no HI concerns. Mother reports no concerns with pt returning home at discharge. Mother endorses some concerns for SI stating that pt has a history of cutting herself and makes statements that she doesn't want to live.  Charlann Lange Carole Doner MSW LCSW 04/15/2019, 9:35 AM

## 2019-04-15 NOTE — Tx Team (Signed)
Initial Treatment Plan 04/15/2019 3:23 AM [name] PNT:[phone]    PATIENT STRESSORS: Medication change or noncompliance Occupational concerns   PATIENT STRENGTHS: Communication skills General fund of knowledge Motivation for treatment/growth Work skills   PATIENT IDENTIFIED PROBLEMS:  risk for suicide  depression  "getting better"                 DISCHARGE CRITERIA:  Improved stabilization in mood, thinking, and/or behavior Verbal commitment to aftercare and medication compliance  PRELIMINARY DISCHARGE PLAN: Attend aftercare/continuing care group Outpatient therapy  PATIENT/FAMILY INVOLVEMENT: This treatment plan has been presented to and reviewed with the patient, Kristen [name].  The patient and family have been given the opportunity to ask questions and make suggestions.  Delos Haring, RN 04/15/2019, 3:23 AM

## 2019-04-15 NOTE — Progress Notes (Signed)
   04/15/19 2200  Psych Admission Type (Psych Patients Only)  Admission Status Voluntary  Psychosocial Assessment  Patient Complaints Anxiety  Eye Contact Fair  Facial Expression Sad  Affect Depressed;Sad;Anxious  Speech Logical/coherent  Interaction Assertive  Motor Activity Slow  Appearance/Hygiene Disheveled  Behavior Characteristics Anxious  Mood Depressed  Aggressive Behavior  Effect No apparent injury  Thought Process  Coherency WDL  Content WDL  Delusions WDL  Perception WDL  Hallucination None reported or observed  Judgment WDL  Confusion WDL  Danger to Self  Current suicidal ideation? Passive  Self-Injurious Behavior No self-injurious ideation or behavior indicators observed or expressed   Agreement Not to Harm Self Yes  Description of Agreement verbal contract for safety  Danger to Others  Danger to Others None reported or observed
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
## 2019-04-15 NOTE — Progress Notes (Signed)
D:  Patient denied SI and HI, contracts for safety.  Denied A/V hallucinations.  Denied pain. A:  Medications administered per MD orders.  Emotional support and encouragement given patient. R:  Safety maintained with 15 minute checks.  

## 2019-04-15 NOTE — Progress Notes (Signed)
Pt presented as a walk in. Alert and oriented by 3. Pt observed with depressed mood and logical speech.  Reports worsening depression and sadness. Verbally contracted for safety. Emotional support and availability offered to Patient as needed. Skin assessment done and belongings searched per protocol.  Items deemed contraband secured in locker 13.  Pt transferred to [address] ambulating with steady gait accompanied by staff. Q15 minutes safety checks initiated without self harm gestures.

## 2019-04-15 NOTE — Progress Notes (Signed)
Adult Psychoeducational Group Note  Date:  04/15/2019 Time:  6:34 PM  Group Topic/Focus:  Coping With Mental Health Crisis:   The purpose of this group is to help patients identify strategies for coping with mental health crisis.  Group discusses possible causes of crisis and ways to manage them effectively.  Participation Level:  Active  Participation Quality:  Appropriate  Affect:  Appropriate  Cognitive:  Alert  Insight: Appropriate  Engagement in Group:  Engaged  Modes of Intervention:  Discussion  Additional Comments:  Pt attended group and participated in discussion.  Karmon Andis R Timur Nibert 04/15/2019, 6:34 PM

## 2019-04-15 NOTE — H&P (Signed)
Psychiatric Admission Assessment Adult  Patient Identification: Kristen [name] MRN:  [id number] Date of Evaluation:  04/15/2019 Chief Complaint:  MDD (major depressive disorder), recurrent severe, without psychosis ([place]) [F33.2] Principal Diagnosis: Anxiety and depression Diagnosis:  Principal Problem:   Anxiety and depression Active Problems:   MDD (major depressive disorder), recurrent severe, without psychosis ([place])  History of Present Illness: Patient is seen and examined.  Patient is a 34 year old female with a reported past psychiatric history significant for generalized anxiety and depression who presented as a walk-in evaluation to the behavioral health hospital on 04/14/2019.  The patient stated that she was having suicidal ideation.  She stated that she was having random breakdowns at work.  She stated she was under significant stress.  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  She previously was diagnosed with anxiety depression as a child and was treated with Zoloft.  She stated she felt as though it it worked at one time, but then "stopped working".  She started having increased stress approximately 1 to 2 months ago, and was evaluated.  She was started on fluoxetine, and this was increased to 20 mg a day.  She was seen by Dr. Adele Schilder on 04/09/2019.  Her fluoxetine was increased, and she was placed on FMLA.  They also discussed possibly getting neurocognitive testing because of her concern for underlying autism in her case.  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  She stated that all the additional work would fall on her, and she was unable to cope with that.  She discussed having crying spells, having to go to the bathroom a great deal just to have these breakdowns.  She stated that she had  previously cut when she was younger, but now "suppresses that".  She admitted to helplessness, hopelessness and worthlessness.  She was admitted to the hospital for evaluation and stabilization.  Associated Signs/Symptoms: Depression Symptoms:  depressed mood, anhedonia, psychomotor retardation, fatigue, feelings of worthlessness/guilt, difficulty concentrating, hopelessness, suicidal thoughts without plan, anxiety, panic attacks, loss of energy/fatigue, disturbed sleep, weight gain, (Hypo) Manic Symptoms:  Impulsivity, Irritable Mood, Labiality of Mood, Anxiety Symptoms:  Excessive Worry, Psychotic Symptoms:  Denied PTSD Symptoms: Negative Total Time spent with patient: 30 minutes  Past Psychiatric History: Patient stated that she had been seen by psychiatry as a child.  She had been treated with Zoloft and hydroxyzine at that time.  She stated this was for depression and anxiety.  She most recently has been seen by Dr.Arfreen.  She reportedly has dyslexia and has difficulty reading and with math.  No previous psychiatric admissions.  Is the patient at risk to self? Yes.    Has the patient been a risk to self in the past 6 months? No.  Has the patient been a risk to self within the distant past? No.  Is the patient a risk to others? No.  Has the patient been a risk to others in the past 6 months? No.  Has the patient been a risk to others within the distant past? No.   Prior Inpatient Therapy: Prior Inpatient Therapy: No Prior Outpatient Therapy: Prior Outpatient Therapy: No Does patient have an ACCT team?: No Does patient have Intensive In-House Services?  : No Does patient have [facility] services? : No Does patient have  P4CC services?: No  Alcohol Screening: 1. How often do you have a drink containing alcohol?: Never 2. How many drinks containing alcohol do you have on a typical day when you are drinking?: 1 or 2 3. How often do you have six or more drinks on one  occasion?: Never AUDIT-C Score: 0 4. How often during the last year have you found that you were not able to stop drinking once you had started?: Never 5. How often during the last year have you failed to do what was normally expected from you becasue of drinking?: Never 6. How often during the last year have you needed a first drink in the morning to get yourself going after a heavy drinking session?: Never 7. How often during the last year have you had a feeling of guilt of remorse after drinking?: Never 8. How often during the last year have you been unable to remember what happened the night before because you had been drinking?: Never 9. Have you or someone else been injured as a result of your drinking?: No 10. Has a relative or friend or a doctor or another health worker been concerned about your drinking or suggested you cut down?: No Alcohol Use Disorder Identification Test Final Score (AUDIT): 0 Substance Abuse History in the last 12 months:  No. Consequences of Substance Abuse: Negative Previous Psychotropic Medications: Yes  Psychological Evaluations: Yes  Past Medical History:  Past Medical History:  Diagnosis Date  . Anxiety 2013   treated at [facility] by Pauline Good   . Asthma 1989    no hospitalization, or intubation, previously on advair and singulair. asthma worsening.   . 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 Depression 2001   depressed since childhood   . 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 Essential hypertension 03/12/2019  . Kyphosis of thoracic region 2004    painful, runs in dad side   . Migraine   . Pancreatitis 2003   gallstone pancreatitis   . Pancreatitis due to biliary obstruction 2003  . PCOS (polycystic ovarian syndrome) 2014   facial hair, irregular periods, never been pregnant     Past Surgical History:  Procedure Laterality Date  . CHOLECYSTECTOMY  2003   . OPEN REDUCTION INTERNAL FIXATION (ORIF) PROXIMAL PHALANX Right 07/23/2013    Procedure: OPEN TREATMENT RIGHT RING FINGER, PROXIMAL INTERPHALANGEAL/JOINT FRACTURE DISLOCATION;  Surgeon: Jolyn Nap, MD;  Location: [facility];  Service: Orthopedics;  Laterality: Right;  . TONSILLECTOMY     Family History:  Family History  Problem Relation Age of Onset  . Hypertension Mother   . Arnold-Chiari malformation Mother   . Restless legs syndrome Mother   . Osteoporosis Mother   . COPD Mother   . Asthma Mother   . Squamous cell carcinoma Mother        skin   . Eczema Mother   . Arthritis Mother   . Peripheral Artery Disease Mother   . Hypertension Father   . Scoliosis Father   . Bipolar disorder Father   . Congestive Heart Failure Father   . COPD Father   . Diabetes Maternal Grandmother   . Hypertension Maternal Grandmother   . Cancer Paternal Grandmother        colon  . Bone cancer Maternal Grandfather 82       bone marrow   . Multiple myeloma Maternal Grandfather   . Diabetes Paternal Grandfather  Family Psychiatric  History: Father has bipolar disorder, mother has depression. Tobacco Screening:   Social History:  Social History   Substance and Sexual Activity  Alcohol Use No     Social History   Substance and Sexual Activity  Drug Use No    Additional Social History: Marital status: Long term relationship    Pain Medications: see MAR Prescriptions: see MAR Over the Counter: see MAR History of alcohol / drug use?: No history of alcohol / drug abuse                    Allergies:   Allergies  Allergen Reactions  . Mucinex [Guaifenesin Er] Hives, Itching and Swelling  . Penicillins Swelling    Did it involve swelling of the face/tongue/throat, SOB, or low BP? Yes Did it involve sudden or severe rash/hives, skin peeling, or any reaction on the inside of your mouth or nose? No Did you need to seek medical attention at a hospital or doctor's office? Yes When did it last happen?>10 years ago If all above  answers are "NO", may proceed with cephalosporin use.   . Contrast Media [Iodinated Diagnostic Agents] Nausea And Vomiting  . Multihance [Gadobenate] Nausea And Vomiting   Lab Results:  Results for orders placed or performed during the hospital encounter of 04/14/19 (from the past 48 hour(s))  Respiratory Panel by RT PCR (Flu A&B, Covid) - Nasopharyngeal Swab     Status: None   Collection Time: 04/14/19  9:47 PM   Specimen: Nasopharyngeal Swab  Result Value Ref Range   SARS Coronavirus 2 by RT PCR NEGATIVE NEGATIVE    Comment: (NOTE) SARS-CoV-2 target nucleic acids are NOT DETECTED. The SARS-CoV-2 RNA is generally detectable in upper respiratoy specimens during the acute phase of infection. The lowest concentration of SARS-CoV-2 viral copies this assay can detect is 131 copies/mL. A negative result does not preclude SARS-Cov-2 infection and should not be used as the sole basis for treatment or other patient management decisions. A negative result may occur with  improper specimen collection/handling, submission of specimen other than nasopharyngeal swab, presence of viral mutation(s) within the areas targeted by this assay, and inadequate number of viral copies (<131 copies/mL). A negative result must be combined with clinical observations, patient history, and epidemiological information. The expected result is Negative. Fact Sheet for Patients:  PinkCheek.be Fact Sheet for Healthcare Providers:  GravelBags.it This test is not yet ap proved or cleared by the [place] FDA and  has been authorized for detection and/or diagnosis of SARS-CoV-2 by FDA under an Emergency Use Authorization (EUA). This EUA will remain  in effect (meaning this test can be used) for the duration of the COVID-19 declaration under Section 564(b)(1) of the Act, 21 U.S.C. section 360bbb-3(b)(1), unless the authorization is terminated or revoked  sooner.    Influenza A by PCR NEGATIVE NEGATIVE   Influenza B by PCR NEGATIVE NEGATIVE    Comment: (NOTE) The Xpert Xpress SARS-CoV-2/FLU/RSV assay is intended as an aid in  the diagnosis of influenza from Nasopharyngeal swab specimens and  should not be used as a sole basis for treatment. Nasal washings and  aspirates are unacceptable for Xpert Xpress SARS-CoV-2/FLU/RSV  testing. Fact Sheet for Patients: PinkCheek.be Fact Sheet for Healthcare Providers: GravelBags.it This test is not yet approved or cleared by the [place] FDA and  has been authorized for detection and/or diagnosis of SARS-CoV-2 by  FDA under an Emergency Use Authorization (EUA). This EUA will remain  in effect (meaning this test can be used) for the duration of the  Covid-19 declaration under Section 564(b)(1) of the Act, 21  U.S.C. section 360bbb-3(b)(1), unless the authorization is  terminated or revoked. Performed at [facility], [place] [address] [place], [place] [address]   Urinalysis, Complete w Microscopic     Status: Abnormal   Collection Time: 04/15/19  1:30 AM  Result Value Ref Range   Color, Urine YELLOW YELLOW   APPearance TURBID (A) CLEAR   Specific Gravity, Urine 1.026 1.005 - 1.030   pH 6.0 5.0 - 8.0   Glucose, UA NEGATIVE NEGATIVE mg/dL   Hgb urine dipstick NEGATIVE NEGATIVE   Bilirubin Urine NEGATIVE NEGATIVE   Ketones, ur NEGATIVE NEGATIVE mg/dL   Protein, ur NEGATIVE NEGATIVE mg/dL   Nitrite NEGATIVE NEGATIVE   Leukocytes,Ua NEGATIVE NEGATIVE   RBC / HPF 0-5 0 - 5 RBC/hpf   WBC, UA 0-5 0 - 5 WBC/hpf   Bacteria, UA RARE (A) NONE SEEN   Squamous Epithelial / LPF 0-5 0 - 5   Mucus PRESENT     Comment: Performed at [facility], [place] [address], [place], [place] [address]  Urine rapid drug screen (hosp performed)not at [facility]     Status: None   Collection Time: 04/15/19  1:30 AM  Result Value  Ref Range   Opiates NONE DETECTED NONE DETECTED   Cocaine NONE DETECTED NONE DETECTED   Benzodiazepines NONE DETECTED NONE DETECTED   Amphetamines NONE DETECTED NONE DETECTED   Tetrahydrocannabinol NONE DETECTED NONE DETECTED   Barbiturates NONE DETECTED NONE DETECTED    Comment: (NOTE) DRUG SCREEN FOR MEDICAL PURPOSES ONLY.  IF CONFIRMATION IS NEEDED FOR ANY PURPOSE, NOTIFY LAB WITHIN 5 DAYS. LOWEST DETECTABLE LIMITS FOR URINE DRUG SCREEN Drug Class                     Cutoff (ng/mL) Amphetamine and metabolites    1000 Barbiturate and metabolites    200 Benzodiazepine                 295 Tricyclics and metabolites     300 Opiates and metabolites        300 Cocaine and metabolites        300 THC                            50 Performed at [facility], [place] [address], [place], [place] [address]   Comprehensive metabolic panel     Status: None   Collection Time: 04/15/19  6:24 AM  Result Value Ref Range   Sodium 139 135 - 145 mmol/L   Potassium 3.9 3.5 - 5.1 mmol/L   Chloride 107 98 - 111 mmol/L   CO2 23 22 - 32 mmol/L   Glucose, Bld 91 70 - 99 mg/dL    Comment: Glucose reference range applies only to samples taken after fasting for at least 8 hours.   BUN 14 6 - 20 mg/dL   Creatinine, Ser 0.86 0.44 - 1.00 mg/dL   Calcium 9.1 8.9 - 10.3 mg/dL   Total Protein 6.7 6.5 - 8.1 g/dL   Albumin 3.7 3.5 - 5.0 g/dL   AST 20 15 - 41 U/L   ALT 22 0 - 44 U/L   Alkaline Phosphatase 112 38 - 126 U/L   Total Bilirubin 0.7 0.3 - 1.2 mg/dL   GFR calc non Af  Amer >60 >60 mL/min   GFR calc Af Amer >60 >60 mL/min   Anion gap 9 5 - 15    Comment: Performed at [facility], [place] [address], [place], [place] [address]  Hemoglobin A1c     Status: None   Collection Time: 04/15/19  6:24 AM  Result Value Ref Range   Hgb A1c MFr Bld 4.9 4.8 - 5.6 %    Comment: (NOTE) Pre diabetes:          5.7%-6.4% Diabetes:              >6.4% Glycemic control for    <7.0% adults with diabetes    Mean Plasma Glucose 93.93 mg/dL    Comment: Performed at [place] [address] [place], [place] [address]  Magnesium     Status: None   Collection Time: 04/15/19  6:24 AM  Result Value Ref Range   Magnesium 2.1 1.7 - 2.4 mg/dL    Comment: Performed at [facility], [place] [address] [place], [place] [address]  Ethanol     Status: None   Collection Time: 04/15/19  6:24 AM  Result Value Ref Range   Alcohol, Ethyl (B) <10 <10 mg/dL    Comment: (NOTE) Lowest detectable limit for serum alcohol is 10 mg/dL. For medical purposes only. Performed at [facility], [place] [address], [place],  [address]   Lipid panel     Status: Abnormal   Collection Time: 04/15/19  6:24 AM  Result Value Ref Range   Cholesterol 204 (H) 0 - 200 mg/dL   Triglycerides 146 <150 mg/dL   HDL 41 >40 mg/dL   Total CHOL/HDL Ratio 5.0 RATIO   VLDL 29 0 - 40 mg/dL   LDL Cholesterol 134 (H) 0 - 99 mg/dL    Comment:        Total Cholesterol/HDL:CHD Risk Coronary Heart Disease Risk Table                     Men   Women  1/2 Average Risk   3.4   3.3  Average Risk       5.0   4.4  2 X Average Risk   9.6   7.1  3 X Average Risk  23.4   11.0        Use the calculated Patient Ratio above and the CHD Risk Table to determine the patient's CHD Risk.        ATP III CLASSIFICATION (LDL):  <100     mg/dL   Optimal  100-129  mg/dL   Near or Above                    Optimal  130-159  mg/dL   Borderline  160-189  mg/dL   High  >190     mg/dL   Very High Performed at [place] [address], [place],  [address]   Hepatic function panel     Status: None   Collection Time: 04/15/19  6:24 AM  Result Value Ref Range   Total Protein 7.0 6.5 - 8.1 g/dL   Albumin 3.8 3.5 - 5.0 g/dL   AST 20 15 - 41 U/L   ALT 22 0 - 44 U/L   Alkaline Phosphatase 111 38 - 126 U/L   Total Bilirubin 0.5 0.3 - 1.2 mg/dL    Bilirubin, Direct 0.1 0.0 - 0.2 mg/dL   Indirect  Bilirubin 0.4 0.3 - 0.9 mg/dL    Comment: Performed at [facility], [place] [address], [place], [place] [address]  TSH     Status: Abnormal   Collection Time: 04/15/19  6:24 AM  Result Value Ref Range   TSH 4.628 (H) 0.350 - 4.500 uIU/mL    Comment: Performed by a 3rd Generation assay with a functional sensitivity of <=0.01 uIU/mL. Performed at [facility], [place] [address] [place], [place] [address]     Blood Alcohol level:  Lab Results  Component Value Date   ETH <10 64/68/0321    Metabolic Disorder Labs:  Lab Results  Component Value Date   HGBA1C 4.9 04/15/2019   MPG 93.93 04/15/2019   Lab Results  Component Value Date   PROLACTIN 15.9 09/18/2016   PROLACTIN 32.9 (H) 06/20/2016   Lab Results  Component Value Date   CHOL 204 (H) 04/15/2019   TRIG 146 04/15/2019   HDL 41 04/15/2019   CHOLHDL 5.0 04/15/2019   VLDL 29 04/15/2019   LDLCALC 134 (H) 04/15/2019    Current Medications: Current Facility-Administered Medications  Medication Dose Route Frequency Provider Last Rate Last Admin  . acetaminophen (TYLENOL) tablet 650 mg  650 mg Oral Q6H PRN Anike, Adaku C, NP      . albuterol (VENTOLIN HFA) 108 (90 Base) MCG/ACT inhaler 1-2 puff  1-2 puff Inhalation Q6H PRN Sharma Covert, MD      . alum & mag hydroxide-simeth (MAALOX/MYLANTA) 200-200-20 MG/5ML suspension 30 mL  30 mL Oral Q4H PRN Anike, Adaku C, NP      . busPIRone (BUSPAR) tablet 10 mg  10 mg Oral BID Sharma Covert, MD   10 mg at 04/15/19 0851  . hydrOXYzine (ATARAX/VISTARIL) tablet 25 mg  25 mg Oral TID PRN Anike, Adaku C, NP   25 mg at 04/15/19 0121  . magnesium hydroxide (MILK OF MAGNESIA) suspension 30 mL  30 mL Oral Daily PRN Anike, Adaku C, NP      . naproxen (NAPROSYN) tablet 500 mg  500 mg Oral BID WC Sharma Covert, MD   500 mg at 04/15/19 0851  . propranolol (INDERAL) tablet 20 mg  20 mg Oral BID Sharma Covert, MD   20 mg at 04/15/19 0851  . sertraline (ZOLOFT) tablet 25 mg  25 mg Oral Daily Sharma Covert, MD   25 mg at 04/15/19 0851  . traZODone (DESYREL) tablet 50 mg  50 mg Oral QHS PRN Anike, Adaku C, NP   50 mg at 04/15/19 0121   PTA Medications: Medications Prior to Admission  Medication Sig Dispense Refill Last Dose  . albuterol (PROVENTIL) (2.5 MG/3ML) 0.083% nebulizer solution Take 3 mLs (2.5 mg total) every 6 (six) hours as needed by nebulization for wheezing or shortness of breath. 50 vial 5   . albuterol (VENTOLIN HFA) 108 (90 Base) MCG/ACT inhaler Inhale 2 puffs into the lungs every 6 (six) hours as needed for wheezing or shortness of breath. 6.7 g 3   . FLUoxetine (PROZAC) 20 MG capsule Take 1 capsule (20 mg total) by mouth daily. 30 capsule 1   . letrozole (FEMARA) 2.5 MG tablet Take 1 tablet (2.5 mg total) by mouth daily. Take on days 3 to 7 following a spontaneous menses or progestin-induced bleed. (Patient not taking: Reported on 04/15/2019) 5 tablet 2 Not Taking  . medroxyPROGESTERone (PROVERA) 10 MG tablet Take 10 mg by mouth daily.   Not Taking at Unknown  time  . propranolol (INDERAL) 10 MG tablet Take 2 tablets (20 mg total) by mouth 2 (two) times daily. 60 tablet 5     Musculoskeletal: Strength & Muscle Tone: within normal limits Gait & Station: normal Patient leans: N/A  Psychiatric Specialty Exam: Physical Exam  Nursing note and vitals reviewed. Constitutional: She is oriented to person, place, and time. She appears well-developed and well-nourished.  HENT:  Head: Normocephalic and atraumatic.  Respiratory: Effort normal.  Neurological: She is alert and oriented to person, place, and time.    Review of Systems  Blood pressure (!) 118/59, pulse (!) 114, temperature 97.8 F (36.6 C), resp. rate 18, height 5' 6.5" (1.689 m), weight (!) 143.9 kg, SpO2 100 %.Body mass index is 50.45 kg/m.  General Appearance: Casual  Eye Contact:  Fair  Speech:  Normal  Rate  Volume:  Normal  Mood:  Anxious and Depressed  Affect:  Congruent  Thought Process:  Coherent and Descriptions of Associations: Intact  Orientation:  Full (Time, Place, and Person)  Thought Content:  Rumination  Suicidal Thoughts:  Yes.  without intent/plan  Homicidal Thoughts:  No  Memory:  Immediate;   Good Recent;   Good Remote;   Good  Judgement:  Intact  Insight:  Fair  Psychomotor Activity:  Increased  Concentration:  Concentration: Good and Attention Span: Good  Recall:  Good  Fund of Knowledge:  Good  Language:  Good  Akathisia:  Negative  Handed:  Right  AIMS (if indicated):     Assets:  Communication Skills Desire for Improvement Housing Resilience Social Support  ADL's:  Intact  Cognition:  WNL  Sleep:  Number of Hours: 3.25    Treatment Plan Summary: Daily contact with patient to assess and evaluate symptoms and progress in treatment, Medication management and Plan : Patient is seen and examined.  Patient is a 34 year old female with the above stated past psychiatric history who was admitted secondary to suicidal ideation, depression anxiety.  She will be admitted to the hospital.  She will be integrated into the milieu.  She will be encouraged to attend groups.  We will go on and stop the fluoxetine, and I am going to return to sertraline given its success in the past.  We will start that at 25 mg p.o. daily and titrate that.  She will also be placed on buspirone 10 mg p.o. 3 times daily, and this to be an increase during the course of the hospitalization.  She has a past medical history significant for irritable bowel syndrome diarrhea type, and this will be monitored.  She also has a reported history of asthma and albuterol will be continued.  We will also continue her propranolol as well as her Metformin. We will also explore the possibility of previous trauma given her IBS diagnosis.  After discharge she clearly needs to be in either cognitive behavioral  therapy or dialectic behavioral therapy.  Review of her laboratories showed essentially normal electrolytes, normal liver function enzymes.  Her cholesterol is mildly elevated at 204 and her LDL is elevated at 134.  Her triglycerides are elevated at 146.  Her TSH is 4.628.  Drug screen was negative.  She had an MRI of the brain done on 2/24.  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  Her EKG showed a normal sinus rhythm with an normal QTc  interval.  Observation Level/Precautions:  15 minute checks  Laboratory:  Chemistry Profile  Psychotherapy:    Medications:    Consultations:    Discharge Concerns:    Estimated LOS:  Other:     Physician Treatment Plan for Primary Diagnosis: Anxiety and depression Long Term Goal(s): Improvement in symptoms so as ready for discharge  Short Term Goals: Ability to identify changes in lifestyle to reduce recurrence of condition will improve, Ability to verbalize feelings will improve, Ability to disclose and discuss suicidal ideas, Ability to demonstrate self-control will improve, Ability to identify and develop effective coping behaviors will improve and Ability to maintain clinical measurements within normal limits will improve  Physician Treatment Plan for Secondary Diagnosis: Principal Problem:   Anxiety and depression Active Problems:   MDD (major depressive disorder), recurrent severe, without psychosis ([place])  Long Term Goal(s): Improvement in symptoms so as ready for discharge  Short Term Goals: Ability to identify changes in lifestyle to reduce recurrence of condition will improve, Ability to verbalize feelings will improve, Ability to disclose and discuss suicidal ideas, Ability to demonstrate self-control will improve, Ability to identify and develop effective coping behaviors will improve and Ability to maintain  clinical measurements within normal limits will improve  I certify that inpatient services furnished can reasonably be expected to improve the patient's condition.    Sharma Covert, MD 3/11/20219:26 AM

## 2019-04-15 NOTE — Progress Notes (Signed)
Admission Note:  34 yr female who presents [facility] in no acute distress for the treatment of SI and Depression. Pt appears flat and depressed. Pt was calm and cooperative with admission process. Pt presents with passive SI and contracts for safety upon admission. Pt stated she believes she has some sort of un-diagnosed Autism due to her not wanting to be around people, social anxiety and other bizarre behaviors. Pt stated she has sleep apnea in which she needs her CPAP machine. Pt stated she has panic attacks most mornings when she wakes up. Pt stated she has only taken the Prozac for anxiety.    A: Skin was assessed (in observation unit) . PT searched and no contraband found, POC and unit policies explained and understanding verbalized. Consents obtained. Food and fluids offered, and fluids accepted.  R: Pt had no additional questions or concerns.

## 2019-04-16 DIAGNOSIS — F332 Major depressive disorder, recurrent severe without psychotic features: Principal | ICD-10-CM

## 2019-04-16 NOTE — Tx Team (Signed)
Interdisciplinary Treatment and Diagnostic Plan Update  04/16/2019 Time of Session: 9:10am Kristen [name] MRN: [id number]  Principal Diagnosis: Anxiety and depression  Secondary Diagnoses: Principal Problem:   Anxiety and depression Active Problems:   MDD (major depressive disorder), recurrent severe, without psychosis ([place])   Current Medications:  Current Facility-Administered Medications  Medication Dose Route Frequency Provider Last Rate Last Admin  . acetaminophen (TYLENOL) tablet 650 mg  650 mg Oral Q6H PRN Anike, Adaku C, NP      . albuterol (VENTOLIN HFA) 108 (90 Base) MCG/ACT inhaler 1-2 puff  1-2 puff Inhalation Q6H PRN Sharma Covert, MD      . alum & mag hydroxide-simeth (MAALOX/MYLANTA) 200-200-20 MG/5ML suspension 30 mL  30 mL Oral Q4H PRN Anike, Adaku C, NP      . busPIRone (BUSPAR) tablet 10 mg  10 mg Oral BID Sharma Covert, MD   10 mg at 04/15/19 1608  . hydrOXYzine (ATARAX/VISTARIL) tablet 25 mg  25 mg Oral TID PRN Anike, Adaku C, NP   25 mg at 04/15/19 2134  . magnesium hydroxide (MILK OF MAGNESIA) suspension 30 mL  30 mL Oral Daily PRN Anike, Adaku C, NP      . naproxen (NAPROSYN) tablet 500 mg  500 mg Oral BID WC Sharma Covert, MD   500 mg at 04/15/19 1608  . propranolol (INDERAL) tablet 20 mg  20 mg Oral BID Sharma Covert, MD   20 mg at 04/15/19 1608  . sertraline (ZOLOFT) tablet 25 mg  25 mg Oral Daily Sharma Covert, MD   25 mg at 04/15/19 0851  . traZODone (DESYREL) tablet 50 mg  50 mg Oral QHS PRN Anike, Adaku C, NP   50 mg at 04/15/19 2134   PTA Medications: Medications Prior to Admission  Medication Sig Dispense Refill Last Dose  . albuterol (PROVENTIL) (2.5 MG/3ML) 0.083% nebulizer solution Take 3 mLs (2.5 mg total) every 6 (six) hours as needed by nebulization for wheezing or shortness of breath. 50 vial 5   . albuterol (VENTOLIN HFA) 108 (90 Base) MCG/ACT inhaler Inhale 2 puffs into the lungs every 6 (six) hours as needed for  wheezing or shortness of breath. 6.7 g 3   . FLUoxetine (PROZAC) 20 MG capsule Take 1 capsule (20 mg total) by mouth daily. 30 capsule 1   . letrozole (FEMARA) 2.5 MG tablet Take 1 tablet (2.5 mg total) by mouth daily. Take on days 3 to 7 following a spontaneous menses or progestin-induced bleed. (Patient not taking: Reported on 04/15/2019) 5 tablet 2 Not Taking  . medroxyPROGESTERone (PROVERA) 10 MG tablet Take 10 mg by mouth daily.   Not Taking at Unknown time  . propranolol (INDERAL) 10 MG tablet Take 2 tablets (20 mg total) by mouth 2 (two) times daily. 60 tablet 5     Patient Stressors: Medication change or noncompliance Occupational concerns  Patient Strengths: Curator fund of knowledge Motivation for treatment/growth Work skills  Treatment Modalities: Medication Management, Group therapy, Case management,  1 to 1 session with clinician, Psychoeducation, Recreational therapy.   Physician Treatment Plan for Primary Diagnosis: Anxiety and depression Long Term Goal(s): Improvement in symptoms so as ready for discharge Improvement in symptoms so as ready for discharge   Short Term Goals: Ability to identify changes in lifestyle to reduce recurrence of condition will improve Ability to verbalize feelings will improve Ability to disclose and discuss suicidal ideas Ability to demonstrate self-control will improve Ability to identify  and develop effective coping behaviors will improve Ability to maintain clinical measurements within normal limits will improve Ability to identify changes in lifestyle to reduce recurrence of condition will improve Ability to verbalize feelings will improve Ability to disclose and discuss suicidal ideas Ability to demonstrate self-control will improve Ability to identify and develop effective coping behaviors will improve Ability to maintain clinical measurements within normal limits will improve  Medication Management: Evaluate  patient's response, side effects, and tolerance of medication regimen.  Therapeutic Interventions: 1 to 1 sessions, Unit Group sessions and Medication administration.  Evaluation of Outcomes: Progressing  Physician Treatment Plan for Secondary Diagnosis: Principal Problem:   Anxiety and depression Active Problems:   MDD (major depressive disorder), recurrent severe, without psychosis ([facility])  Long Term Goal(s): Improvement in symptoms so as ready for discharge Improvement in symptoms so as ready for discharge   Short Term Goals: Ability to identify changes in lifestyle to reduce recurrence of condition will improve Ability to verbalize feelings will improve Ability to disclose and discuss suicidal ideas Ability to demonstrate self-control will improve Ability to identify and develop effective coping behaviors will improve Ability to maintain clinical measurements within normal limits will improve Ability to identify changes in lifestyle to reduce recurrence of condition will improve Ability to verbalize feelings will improve Ability to disclose and discuss suicidal ideas Ability to demonstrate self-control will improve Ability to identify and develop effective coping behaviors will improve Ability to maintain clinical measurements within normal limits will improve     Medication Management: Evaluate patient's response, side effects, and tolerance of medication regimen.  Therapeutic Interventions: 1 to 1 sessions, Unit Group sessions and Medication administration.  Evaluation of Outcomes: Progressing   RN Treatment Plan for Primary Diagnosis: Anxiety and depression Long Term Goal(s): Knowledge of disease and therapeutic regimen to maintain health will improve  Short Term Goals: Ability to verbalize feelings will improve, Ability to disclose and discuss suicidal ideas, Ability to identify and develop effective coping behaviors will improve and Compliance with prescribed medications  will improve  Medication Management: RN will administer medications as ordered by provider, will assess and evaluate patient's response and provide education to patient for prescribed medication. RN will report any adverse and/or side effects to prescribing provider.  Therapeutic Interventions: 1 on 1 counseling sessions, Psychoeducation, Medication administration, Evaluate responses to treatment, Monitor vital signs and CBGs as ordered, Perform/monitor CIWA, COWS, AIMS and Fall Risk screenings as ordered, Perform wound care treatments as ordered.  Evaluation of Outcomes: Progressing   LCSW Treatment Plan for Primary Diagnosis: Anxiety and depression Long Term Goal(s): Safe transition to appropriate next level of care at discharge, Engage patient in therapeutic group addressing interpersonal concerns.  Short Term Goals: Engage patient in aftercare planning with referrals and resources  Therapeutic Interventions: Assess for all discharge needs, 1 to 1 time with Social worker, Explore available resources and support systems, Assess for adequacy in community support network, Educate family and significant other(s) on suicide prevention, Complete Psychosocial Assessment, Interpersonal group therapy.  Evaluation of Outcomes: Adequate for Discharge   Progress in Treatment: Attending groups: No. Participating in groups: No. Taking medication as prescribed: Yes. Toleration medication: Yes. Family/Significant other contact made: Yes, individual(s) contacted:  with the patient's mother Patient understands diagnosis: Yes. Discussing patient identified problems/goals with staff: Yes. Medical problems stabilized or resolved: Yes. Denies suicidal/homicidal ideation: No. Passive Issues/concerns per patient self-inventory: No. Other:   New problem(s) identified: None   New Short Term/Long Term Goal(s): medication stabilization, elimination  of SI thoughts, development of comprehensive mental  wellness plan.    Patient Goals: "Seek some sort of treatment because my breakdowns were getting worse.  I needed to relieve myself from everything"   Discharge Plan or Barriers: Patient plans to return home with family. She will continue to follow up with [facility] for outpatient medication management and therapy services. CSW will continue to follow and assess for appropriate referrals and possible discharge planning.    Reason for Continuation of Hospitalization: Anxiety Depression Medication stabilization Suicidal ideation  Estimated Length of Stay: 3-5 days   Attendees: Patient: Kristen [name]  04/16/2019 8:51 AM  Physician: Dr. Landry Mellow, MD 04/16/2019 8:51 AM  Nursing:  04/16/2019 8:51 AM  RN Care Manager: 04/16/2019 8:51 AM  Social Worker: Baldo Daub, LCSW 04/16/2019 8:51 AM  Recreational Therapist:  04/16/2019 8:51 AM  Other:  04/16/2019 8:51 AM  Other:  04/16/2019 8:51 AM  Other: 04/16/2019 8:51 AM    Scribe for Treatment Team: Maeola Sarah, LCSWA 04/16/2019 8:51 AM

## 2019-04-16 NOTE — Progress Notes (Signed)
   04/16/19 1800  Psych Admission Type (Psych Patients Only)  Admission Status Voluntary  Psychosocial Assessment  Patient Complaints Anxiety  Eye Contact Fair  Facial Expression Sad  Affect Depressed;Sad;Anxious  Speech Logical/coherent  Interaction Assertive  Motor Activity Slow  Appearance/Hygiene Disheveled  Behavior Characteristics Anxious  Mood Depressed  Aggressive Behavior  Effect No apparent injury  Thought Process  Coherency WDL  Content WDL  Delusions WDL  Perception WDL  Hallucination None reported or observed  Judgment WDL  Confusion WDL  Danger to Self  Current suicidal ideation? Denies  Self-Injurious Behavior No self-injurious ideation or behavior indicators observed or expressed   Agreement Not to Harm Self Yes  Description of Agreement verbal contract for safety  Danger to Others  Danger to Others None reported or observed

## 2019-04-16 NOTE — [facility] Group Notes (Signed)
Type of Therapy/Topic: Identifying Irrational Beliefs/Thoughts  Participation Level: Did Not Attend  Description of Group: The purpose of this group is to assist patients in learning to identify irrational beliefs and thoughts that contribute to their negative emotions and experience positive emotions. Patients will be guided to discuss ways in which they have been effected by irrational thoughts and beliefs and how to transform those irrational beliefs into rational ones. Newly identified rational beliefs will be juxtaposed with experiences of positive emotions or situations, and patients will be challenged to use rational beliefs or thoughts to combat negative ones. Special emphasis will be placed on coping with irrational beliefs in conflict situations, and patients will process healthy conflict resolution skills.  Therapeutic Goals: 1. Patient will identify two irrational thoughts or beliefs  to reflect on in order to balance out those thoughts 2. Patient will label two or more irrational thoughts/beliefs that they find the most difficult to cope with 3. Patient will demonstrate positive conflict resolution skills through discussion and/or role plays that will assist in transforming irrational thoughts or beliefs into positive ones.  Summary of Patient Progress:  Invited, chose not to attend.    Therapeutic Modalities: Cognitive Behavioral Therapy Feelings Identification Dialectical Behavioral Therapy   

## 2019-04-16 NOTE — Progress Notes (Signed)
Recreation Therapy Notes  Date:  3.12.21 Time: 0930 Location: [address] Dayroom  Group Topic: Stress Management  Goal Area(s) Addresses:  Patient will identify positive stress management techniques. Patient will identify benefits of using stress management post d/c.  Intervention: Stress Management  Activity :  Meditation.  LRT played a meditation that focused on being resilient in the face of adversity.  Patients were to listen as meditation played to engage in activity.  Education:  Stress Management, Discharge Planning.   Education Outcome: Acknowledges Education  Clinical Observations/Feedback: Pt did not attend group activity.    Caroll Rancher, LRT/CTRS         Lillia Abed, Faithlyn Recktenwald A 04/16/2019 10:59 AM

## 2019-04-16 NOTE — Progress Notes (Signed)
[facility] MD Progress Note  04/16/2019 9:51 AM Kristen [name]  MRN:  [id number]   Principal Problem: Anxiety and depression   Diagnosis: Principal Problem:   MDD (major depressive disorder), recurrent severe, without psychosis (HCC) Active Problems:   Anxiety and depression  Subjective: " I am fine. I am sleep, I didn't sleep very well. I sleep with a CPAP at home."  Objective: Kristen [name] is a 34 y.o. female who presented to [facility] as a walk-in on 04/14/2019 due to worsening depression and anxiety. She reported hopelessness, worthlessness, frequent crying spells, and suicidal ideations. She was admitted for evaluation and stabilization. On evaluation today, the patient is lying in the bed in her room. She is alert and oriented x 4, pleasant, and coopearive. Speech is clear and coherent. Mood is de[pressed and anxious. Affect is congruent with mood. She rated depression today as 4 out of 10 and anxiety as 5 out of 10 with 10 being the most severe. She states that she did not sleep well last night due to not having her CPAP. She estimates that she slept 6 hours. It is documented that she slept 3.75 hours. She states that she will have someone bring her CPAP from home. She was started on buspirone and sertraline on 04/15/2019. She states that she is tolearting well without any side effects. She denies any suicidal thoughts. She is able to contact for safety while in the hospital. She denies homicidal thoughts and audiovisual hallucinations.    From Admission H&P: Patient is a 34-year-old female with the above stated past psychiatric history who was admitted secondary to suicidal ideation, depression anxiety.  She will be admitted to the hospital.  She will be integrated into the milieu.  She will be encouraged to attend groups.  We will go on and stop the fluoxetine, and I am going to return to sertraline given its success in the past.  We will start that at 25 mg p.o. daily and titrate that.  She will  also be placed on buspirone 10 mg p.o. 3 times daily, and this to be an increase during the course of the hospitalization.  She has a past medical history significant for irritable bowel syndrome diarrhea type, and this will be monitored.  She also has a reported history of asthma and albuterol will be continued.  We will also continue her propranolol as well as her Metformin. We will also explore the possibility of previous trauma given her IBS diagnosis.  After discharge she clearly needs to be in either cognitive behavioral therapy or dialectic behavioral therapy.  Review of her laboratories showed essentially normal electrolytes, normal liver function enzymes.  Her cholesterol is mildly elevated at 204 and her LDL is elevated at 134.  Her triglycerides are elevated at 146.  Her TSH is 4.628.  Drug screen was negative.  She had an MRI of the brain done on 2/24.  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  Her EKG showed a normal sinus rhythm with an normal QTc interval.    Total Time spent with patient: 15 minutes  Past Psychiatric History: See admission H&P  Past Medical History:  Past Medical History:  Diagnosis Date  . Anxiety 2013   treated at [facility] by Karen Jones   . Asthma 1989    no hospitalization, or intubation, previously on advair and singulair. asthma   worsening.   . 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 Depression 2001   depressed since childhood   . 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 Essential hypertension 03/12/2019  . Kyphosis of thoracic region 2004    painful, runs in dad side   . Migraine   . Pancreatitis 2003   gallstone pancreatitis   . Pancreatitis due to biliary obstruction 2003  . PCOS (polycystic ovarian syndrome) 2014   facial hair, irregular periods, never been pregnant     Past Surgical History:   Procedure Laterality Date  . CHOLECYSTECTOMY  2003   . OPEN REDUCTION INTERNAL FIXATION (ORIF) PROXIMAL PHALANX Right 07/23/2013   Procedure: OPEN TREATMENT RIGHT RING FINGER, PROXIMAL INTERPHALANGEAL/JOINT FRACTURE DISLOCATION;  Surgeon: Jolyn Nap, MD;  Location: [facility];  Service: Orthopedics;  Laterality: Right;  . TONSILLECTOMY     Family History:  Family History  Problem Relation Age of Onset  . Hypertension Mother   . Arnold-Chiari malformation Mother   . Restless legs syndrome Mother   . Osteoporosis Mother   . COPD Mother   . Asthma Mother   . Squamous cell carcinoma Mother        skin   . Eczema Mother   . Arthritis Mother   . Peripheral Artery Disease Mother   . Hypertension Father   . Scoliosis Father   . Bipolar disorder Father   . Congestive Heart Failure Father   . COPD Father   . Diabetes Maternal Grandmother   . Hypertension Maternal Grandmother   . Cancer Paternal Grandmother        colon  . Bone cancer Maternal Grandfather 82       bone marrow   . Multiple myeloma Maternal Grandfather   . Diabetes Paternal Grandfather    Family Psychiatric  History: See admission H&P  Social History:  Social History   Substance and Sexual Activity  Alcohol Use No     Social History   Substance and Sexual Activity  Drug Use No    Social History   Socioeconomic History  . Marital status: Single    Spouse name: Not on file  . Number of children: 0  . Years of education: college   . Highest education level: Not on file  Occupational History  . Occupation: Unemployed   Tobacco Use  . Smoking status: Never Smoker  . Smokeless tobacco: Never Used  Substance and Sexual Activity  . Alcohol use: No  . Drug use: No  . Sexual activity: Yes    Birth control/protection: None  Other Topics Concern  . Not on file  Social History Narrative   Lives with mom [name])    Right-handed   Caffeine: 3 cups of coffee per day   Social  Determinants of Health   Financial Resource Strain:   . Difficulty of Paying Living Expenses:   Food Insecurity:   . Worried About Charity fundraiser in the Last Year:   . Arboriculturist in the Last Year:   Transportation Needs:   . Film/video editor (Medical):   Marland Kitchen Lack of Transportation (Non-Medical):   Physical Activity:   . Days of Exercise per Week:   . Minutes of Exercise per Session:   Stress:   . Feeling of Stress :   Social Connections:   . Frequency of Communication with Friends and Family:   . Frequency of Social Gatherings with  Friends and Family:   . Attends Religious Services:   . Active Member of Clubs or Organizations:   . Attends Club or Organization Meetings:   . Marital Status:    Additional Social History:    Pain Medications: see MAR Prescriptions: see MAR Over the Counter: see MAR History of alcohol / drug use?: No history of alcohol / drug abuse     Sleep: Fair  Appetite:  Good  Current Medications: Current Facility-Administered Medications  Medication Dose Route Frequency Provider Last Rate Last Admin  . acetaminophen (TYLENOL) tablet 650 mg  650 mg Oral Q6H PRN Anike, Adaku C, NP      . albuterol (VENTOLIN HFA) 108 (90 Base) MCG/ACT inhaler 1-2 puff  1-2 puff Inhalation Q6H PRN Clary, Greg Lawson, MD      . alum & mag hydroxide-simeth (MAALOX/MYLANTA) 200-200-20 MG/5ML suspension 30 mL  30 mL Oral Q4H PRN Anike, Adaku C, NP      . busPIRone (BUSPAR) tablet 10 mg  10 mg Oral BID Clary, Greg Lawson, MD   10 mg at 04/16/19 0857  . hydrOXYzine (ATARAX/VISTARIL) tablet 25 mg  25 mg Oral TID PRN Anike, Adaku C, NP   25 mg at 04/15/19 2134  . magnesium hydroxide (MILK OF MAGNESIA) suspension 30 mL  30 mL Oral Daily PRN Anike, Adaku C, NP      . naproxen (NAPROSYN) tablet 500 mg  500 mg Oral BID WC Clary, Greg Lawson, MD   500 mg at 04/16/19 0857  . propranolol (INDERAL) tablet 20 mg  20 mg Oral BID Clary, Greg Lawson, MD   20 mg at 04/16/19 0857   . sertraline (ZOLOFT) tablet 25 mg  25 mg Oral Daily Clary, Greg Lawson, MD   25 mg at 04/16/19 0857  . traZODone (DESYREL) tablet 50 mg  50 mg Oral QHS PRN Anike, Adaku C, NP   50 mg at 04/15/19 2134    Lab Results:  Results for orders placed or performed during the hospital encounter of 04/14/19 (from the past 48 hour(s))  Respiratory Panel by RT PCR (Flu A&B, Covid) - Nasopharyngeal Swab     Status: None   Collection Time: 04/14/19  9:47 PM   Specimen: Nasopharyngeal Swab  Result Value Ref Range   SARS Coronavirus 2 by RT PCR NEGATIVE NEGATIVE    Comment: (NOTE) SARS-CoV-2 target nucleic acids are NOT DETECTED. The SARS-CoV-2 RNA is generally detectable in upper respiratoy specimens during the acute phase of infection. The lowest concentration of SARS-CoV-2 viral copies this assay can detect is 131 copies/mL. A negative result does not preclude SARS-Cov-2 infection and should not be used as the sole basis for treatment or other patient management decisions. A negative result may occur with  improper specimen collection/handling, submission of specimen other than nasopharyngeal swab, presence of viral mutation(s) within the areas targeted by this assay, and inadequate number of viral copies (<131 copies/mL). A negative result must be combined with clinical observations, patient history, and epidemiological information. The expected result is Negative. Fact Sheet for Patients:  https://www.fda.gov/media/142436/download Fact Sheet for Healthcare Providers:  https://www.fda.gov/media/142435/download This test is not yet ap proved or cleared by the [place] FDA and  has been authorized for detection and/or diagnosis of SARS-CoV-2 by FDA under an Emergency Use Authorization (EUA). This EUA will remain  in effect (meaning this test can be used) for the duration of the COVID-19 declaration under Section 564(b)(1) of the Act, 21 U.S.C. section 360bbb-3(b)(1), unless the  authorization   is terminated or revoked sooner.    Influenza A by PCR NEGATIVE NEGATIVE   Influenza B by PCR NEGATIVE NEGATIVE    Comment: (NOTE) The Xpert Xpress SARS-CoV-2/FLU/RSV assay is intended as an aid in  the diagnosis of influenza from Nasopharyngeal swab specimens and  should not be used as a sole basis for treatment. Nasal washings and  aspirates are unacceptable for Xpert Xpress SARS-CoV-2/FLU/RSV  testing. Fact Sheet for Patients: https://www.fda.gov/media/142436/download Fact Sheet for Healthcare Providers: https://www.fda.gov/media/142435/download This test is not yet approved or cleared by the [place] FDA and  has been authorized for detection and/or diagnosis of SARS-CoV-2 by  FDA under an Emergency Use Authorization (EUA). This EUA will remain  in effect (meaning this test can be used) for the duration of the  Covid-19 declaration under Section 564(b)(1) of the Act, 21  U.S.C. section 360bbb-3(b)(1), unless the authorization is  terminated or revoked. Performed at [facility], [address] [place], [place] [address]   Urinalysis, Complete w Microscopic     Status: Abnormal   Collection Time: 04/15/19  1:30 AM  Result Value Ref Range   Color, Urine YELLOW YELLOW   APPearance TURBID (A) CLEAR   Specific Gravity, Urine 1.026 1.005 - 1.030   pH 6.0 5.0 - 8.0   Glucose, UA NEGATIVE NEGATIVE mg/dL   Hgb urine dipstick NEGATIVE NEGATIVE   Bilirubin Urine NEGATIVE NEGATIVE   Ketones, ur NEGATIVE NEGATIVE mg/dL   Protein, ur NEGATIVE NEGATIVE mg/dL   Nitrite NEGATIVE NEGATIVE   Leukocytes,Ua NEGATIVE NEGATIVE   RBC / HPF 0-5 0 - 5 RBC/hpf   WBC, UA 0-5 0 - 5 WBC/hpf   Bacteria, UA RARE (A) NONE SEEN   Squamous Epithelial / LPF 0-5 0 - 5   Mucus PRESENT     Comment: Performed at [facility], [address] [place], [place] [address]  Urine rapid drug screen (hosp performed)not at [facility]     Status: None   Collection  Time: 04/15/19  1:30 AM  Result Value Ref Range   Opiates NONE DETECTED NONE DETECTED   Cocaine NONE DETECTED NONE DETECTED   Benzodiazepines NONE DETECTED NONE DETECTED   Amphetamines NONE DETECTED NONE DETECTED   Tetrahydrocannabinol NONE DETECTED NONE DETECTED   Barbiturates NONE DETECTED NONE DETECTED    Comment: (NOTE) DRUG SCREEN FOR MEDICAL PURPOSES ONLY.  IF CONFIRMATION IS NEEDED FOR ANY PURPOSE, NOTIFY LAB WITHIN 5 DAYS. LOWEST DETECTABLE LIMITS FOR URINE DRUG SCREEN Drug Class                     Cutoff (ng/mL) Amphetamine and metabolites    1000 Barbiturate and metabolites    200 Benzodiazepine                 200 Tricyclics and metabolites     300 Opiates and metabolites        300 Cocaine and metabolites        300 THC                            50 Performed at [facility], [address] [place], [place] [address]   Comprehensive metabolic panel     Status: None   Collection Time: 04/15/19  6:24 AM  Result Value Ref Range   Sodium 139 135 - 145 mmol/L   Potassium 3.9 3.5 - 5.1 mmol/L   Chloride 107 98 -   111 mmol/L   CO2 23 22 - 32 mmol/L   Glucose, Bld 91 70 - 99 mg/dL    Comment: Glucose reference range applies only to samples taken after fasting for at least 8 hours.   BUN 14 6 - 20 mg/dL   Creatinine, Ser 0.86 0.44 - 1.00 mg/dL   Calcium 9.1 8.9 - 10.3 mg/dL   Total Protein 6.7 6.5 - 8.1 g/dL   Albumin 3.7 3.5 - 5.0 g/dL   AST 20 15 - 41 U/L   ALT 22 0 - 44 U/L   Alkaline Phosphatase 112 38 - 126 U/L   Total Bilirubin 0.7 0.3 - 1.2 mg/dL   GFR calc non Af Amer >60 >60 mL/min   GFR calc Af Amer >60 >60 mL/min   Anion gap 9 5 - 15    Comment: Performed at [facility], [address] [place], [place] [address]  Hemoglobin A1c     Status: None   Collection Time: 04/15/19  6:24 AM  Result Value Ref Range   Hgb A1c MFr Bld 4.9 4.8 - 5.6 %    Comment: (NOTE) Pre diabetes:          5.7%-6.4% Diabetes:               >6.4% Glycemic control for   <7.0% adults with diabetes    Mean Plasma Glucose 93.93 mg/dL    Comment: Performed at [facility], [address] [place], [place] [address]  Magnesium     Status: None   Collection Time: 04/15/19  6:24 AM  Result Value Ref Range   Magnesium 2.1 1.7 - 2.4 mg/dL    Comment: Performed at [facility], [address] [place], [place] [address]  Ethanol     Status: None   Collection Time: 04/15/19  6:24 AM  Result Value Ref Range   Alcohol, Ethyl (B) <10 <10 mg/dL    Comment: (NOTE) Lowest detectable limit for serum alcohol is 10 mg/dL. For medical purposes only. Performed at [facility], [address] [place], [place] [address]   Lipid panel     Status: Abnormal   Collection Time: 04/15/19  6:24 AM  Result Value Ref Range   Cholesterol 204 (H) 0 - 200 mg/dL   Triglycerides 146 <150 mg/dL   HDL 41 >40 mg/dL   Total CHOL/HDL Ratio 5.0 RATIO   VLDL 29 0 - 40 mg/dL   LDL Cholesterol 134 (H) 0 - 99 mg/dL    Comment:        Total Cholesterol/HDL:CHD Risk Coronary Heart Disease Risk Table                     Men   Women  1/2 Average Risk   3.4   3.3  Average Risk       5.0   4.4  2 X Average Risk   9.6   7.1  3 X Average Risk  23.4   11.0        Use the calculated Patient Ratio above and the CHD Risk Table to determine the patient's CHD Risk.        ATP III CLASSIFICATION (LDL):  <100     mg/dL   Optimal  100-129  mg/dL   Near or Above                    Optimal  130-159  mg/dL     Borderline  160-189  mg/dL   High  >190     mg/dL   Very High Performed at [facility], [address] [place], [place] [address]   Hepatic function panel     Status: None   Collection Time: 04/15/19  6:24 AM  Result Value Ref Range   Total Protein 7.0 6.5 - 8.1 g/dL   Albumin 3.8 3.5 - 5.0 g/dL   AST 20 15 - 41 U/L   ALT 22 0 - 44 U/L   Alkaline Phosphatase 111 38 - 126 U/L   Total  Bilirubin 0.5 0.3 - 1.2 mg/dL   Bilirubin, Direct 0.1 0.0 - 0.2 mg/dL   Indirect Bilirubin 0.4 0.3 - 0.9 mg/dL    Comment: Performed at [facility], [address] [place], [place] [address]  TSH     Status: Abnormal   Collection Time: 04/15/19  6:24 AM  Result Value Ref Range   TSH 4.628 (H) 0.350 - 4.500 uIU/mL    Comment: Performed by a 3rd Generation assay with a functional sensitivity of <=0.01 uIU/mL. Performed at [facility], [address] [place], [place] [address]     Blood Alcohol level:  Lab Results  Component Value Date   ETH <10 04/15/2019    Metabolic Disorder Labs: Lab Results  Component Value Date   HGBA1C 4.9 04/15/2019   MPG 93.93 04/15/2019   Lab Results  Component Value Date   PROLACTIN 15.9 09/18/2016   PROLACTIN 32.9 (H) 06/20/2016   Lab Results  Component Value Date   CHOL 204 (H) 04/15/2019   TRIG 146 04/15/2019   HDL 41 04/15/2019   CHOLHDL 5.0 04/15/2019   VLDL 29 04/15/2019   LDLCALC 134 (H) 04/15/2019    Physical Findings: AIMS:  , ,  ,  ,    CIWA:    COWS:     Musculoskeletal: Strength & Muscle Tone: within normal limits Gait & Station: normal Patient leans: N/A  Psychiatric Specialty Exam: Physical Exam  Constitutional: She is oriented to person, place, and time. She appears well-developed and well-nourished. No distress.  HENT:  Head: Normocephalic.  Right Ear: External ear normal.  Cardiovascular: Normal rate.  Respiratory: Effort normal. No respiratory distress.  Musculoskeletal:        General: Normal range of motion.  Neurological: She is alert and oriented to person, place, and time.  Skin: She is not diaphoretic.  Psychiatric: Her mood appears anxious. She is not withdrawn and not actively hallucinating. Thought content is not paranoid and not delusional. She exhibits a depressed mood. She expresses no homicidal and no suicidal ideation.    Review of Systems   Constitutional: Negative for activity change, appetite change, chills, diaphoresis, fatigue and fever.  Respiratory: Negative for cough and shortness of breath.   Cardiovascular: Negative for chest pain.  Gastrointestinal: Negative for diarrhea, nausea and vomiting.  Neurological: Negative for dizziness and headaches.  Psychiatric/Behavioral: Positive for dysphoric mood and sleep disturbance. Negative for hallucinations and suicidal ideas. The patient is nervous/anxious.   All other systems reviewed and are negative.   Blood pressure (!) 142/100, pulse 78, temperature 98 F (36.7 C), temperature source Oral, resp. rate 16, height 5' 6.5" (1.689 m), weight (!) 143.9 kg, SpO2 100 %.Body mass index is 50.45 kg/m.  General Appearance: Casual  Eye Contact:  Fair  Speech:  Clear and Coherent and Normal Rate  Volume:  Normal  Mood:  Anxious and Depressed  Affect:    Congruent and Depressed  Thought Process:  Coherent, Linear and Descriptions of Associations: Intact  Orientation:  Full (Time, Place, and Person)  Thought Content:  Logical and Hallucinations: None  Suicidal Thoughts:  No  Homicidal Thoughts:  No  Memory:  Immediate;   Good Recent;   Good Remote;   Good  Judgement:  Intact  Insight:  Fair  Psychomotor Activity:  Normal  Concentration:  Concentration: Good and Attention Span: Good  Recall:  Good  Fund of Knowledge:  Good  Language:  Good  Akathisia:  Negative  Handed:  Right  AIMS (if indicated):     Assets:  Communication Skills Desire for Improvement Housing Leisure Time Resilience  ADL's:  Intact  Cognition:  WNL  Sleep:  Number of Hours: 3.25     Treatment Plan Summary: Daily contact with patient to assess and evaluate symptoms and progress in treatment and Medication management   Continue inpatient admission for stabilization and treatment  Continue buspirone 10 mg TID for anxiety Continue sertraline 25 mg daily for depression/anxiety Continue propanolol  20 mg BID for hypertension Continue hydroxyzine 25 mg TID prn for anxiety Continue albuterol INH 1-2 puffs every 6 hours prn for wheezing/SOB Continue trazodone 50 mg QHS prn for sleep   A , NP 04/16/2019, 9:51 AM 

## 2019-04-17 NOTE — Progress Notes (Signed)
[facility] Group Notes:  (Nursing/MHT/Case Management/Adjunct)  Date:  04/17/2019  Time:  2030  Type of Therapy:  wrap up group  Participation Level:  Active  Participation Quality:  Appropriate, Attentive, Sharing and Supportive  Affect:  Appropriate  Cognitive:  Appropriate  Insight:  Improving  Engagement in Group:  Engaged  Modes of Intervention:  Clarification, Education and Support  Summary of Progress/Problems: Positive thinking and positive change were discussed.   Marcille Buffy 04/17/2019, 9:01 PM

## 2019-04-17 NOTE — [facility] Group Notes (Signed)
LCSW Group Therapy Note  04/17/2019    10:00-11:00am   Type of Therapy and Topic:  Group Therapy: Early Messages Received About Anger  Participation Level:  Active   Description of Group:   In this group, patients shared and discussed the early messages received in their lives about anger through parental or other adult modeling, teaching, repression, punishment, violence, and more.  Participants identified how those childhood lessons influence even now how they usually or often react when angered.  The group discussed that anger is a secondary emotion and what may be the underlying emotional themes that come out through anger outbursts or that are ignored through anger suppression.  Finally, as a group there was a conversation about the workbook's quote that "There is nothing wrong with anger; it is just a sign something needs to change."     Therapeutic Goals: Patients will identify one or more childhood message about anger that they received and how it was taught to them. Patients will discuss how these childhood experiences have influenced and continue to influence their own expression or repression of anger even today. Patients will explore possible primary emotions that tend to fuel their secondary emotion of anger. Patients will learn that anger itself is normal and cannot be eliminated, and that healthier coping skills can assist with resolving conflict rather than worsening situations.  Summary of Patient Progress:  The patient shared that her childhood lessons about anger were from father, who was an angry individual with Bipolar disorder and would throw things and yell a lot.  However, if she got angry, he would say she was overreacting.  As a result, she learned to bottle up her feelings and continues to do so now.  When angry, she will cry for the most part.  She was very attentive throughout group, listened rather than talked.  Therapeutic Modalities:   Cognitive Behavioral  Therapy Motivation Interviewing  Lynnell Chad  .

## 2019-04-17 NOTE — Progress Notes (Signed)
   04/17/19 2249  COVID-19 Daily Checkoff  Have you had a fever (temp > 37.80C/100F)  in the past 24 hours?  No  If you have had runny nose, nasal congestion, sneezing in the past 24 hours, has it worsened? No  COVID-19 EXPOSURE  Have you traveled outside the state in the past 14 days? No  Have you been in contact with someone with a confirmed diagnosis of COVID-19 or PUI in the past 14 days without wearing appropriate PPE? No  Have you been living in the same home as a person with confirmed diagnosis of COVID-19 or a PUI (household contact)? No  Have you been diagnosed with COVID-19? No

## 2019-04-17 NOTE — Progress Notes (Signed)
   04/17/19 1200  Psych Admission Type (Psych Patients Only)  Admission Status Voluntary  Psychosocial Assessment  Patient Complaints Anxiety  Eye Contact Fair  Facial Expression Flat  Affect Appropriate to circumstance  Speech Logical/coherent  Interaction Assertive  Motor Activity Slow  Appearance/Hygiene Unremarkable  Behavior Characteristics Cooperative;Anxious  Mood Pleasant  Aggressive Behavior  Effect No apparent injury  Thought Process  Coherency WDL  Content WDL  Delusions WDL  Perception WDL  Hallucination None reported or observed  Judgment WDL  Confusion WDL  Danger to Self  Current suicidal ideation? Denies  Self-Injurious Behavior No self-injurious ideation or behavior indicators observed or expressed   Agreement Not to Harm Self Yes  Description of Agreement verbal contract for safety  Danger to Others  Danger to Others None reported or observed

## 2019-04-17 NOTE — Progress Notes (Signed)
   04/16/19 2244  Psych Admission Type (Psych Patients Only)  Admission Status Voluntary  Psychosocial Assessment  Patient Complaints None  Eye Contact Fair  Facial Expression Flat  Affect Appropriate to circumstance  Speech Logical/coherent  Interaction Assertive  Appearance/Hygiene Unremarkable  Behavior Characteristics Cooperative;Appropriate to situation  Mood Pleasant  Thought Process  Coherency WDL  Content WDL  Delusions WDL  Perception WDL  Hallucination None reported or observed  Judgment WDL  Confusion WDL  Danger to Self  Current suicidal ideation? Denies  Danger to Others  Danger to Others None reported or observed  Reports mood improvement. Order obtained for CPAP machine at bedtime.

## 2019-04-17 NOTE — Progress Notes (Signed)
   04/17/19 2251  Psych Admission Type (Psych Patients Only)  Admission Status Voluntary  Psychosocial Assessment  Patient Complaints Anxiety  Eye Contact Fair  Facial Expression Flat  Affect Appropriate to circumstance  Speech Logical/coherent  Interaction Assertive  Motor Activity Other (Comment) (WDL)  Appearance/Hygiene Unremarkable  Behavior Characteristics Appropriate to situation  Mood Pleasant  Thought Process  Coherency WDL  Content WDL  Delusions None reported or observed  Perception WDL  Hallucination None reported or observed  Judgment WDL  Confusion None  Danger to Self  Current suicidal ideation? Denies  Agreement Not to Harm Self Yes  Description of Agreement verbal contract for safety  Danger to Others  Danger to Others None reported or observed

## 2019-04-17 NOTE — Progress Notes (Signed)
[facility] MD Progress Note  04/17/2019 1:03 PM Kristen [name]  MRN:  [id number]   Principal Problem: MDD (major depressive disorder), recurrent severe, without psychosis ([place])   Diagnosis: Principal Problem:   MDD (major depressive disorder), recurrent severe, without psychosis ([place]) Active Problems:   Anxiety and depression  Subjective: Kristen [name] reports, "I'm feeling better. I'm doing well on my medicines, except that they giving me dry mouth. I think I'm ready to be discharged out of here".  Objective: Kristen [name] is a 34 y.o. female who presented to [facility] as a walk-in on 04/14/2019 due to worsening depression and anxiety. She reported hopelessness, worthlessness, frequent crying spells, and suicidal ideations. She was admitted for evaluation and stabilization. On evaluation today, the patient is lying in the bed in her room. She is alert and oriented x 4, pleasant, and coopearive. Speech is clear and coherent. Mood is de[pressed and anxious. Affect is congruent with mood. She rated depression today as 2 out of 10 and anxiety as 2 out of 10 with 10 being the most severe. She states that she slept well last night with her CPAP machine. Chart review indicated that she slept for about 6.75 hours. She was started on buspirone and sertraline on 04/15/2019. She states that she is tolerating her medications well without any side effects. She denies any suicidal thoughts. She is able to contact for safety while in the hospital. She denies homicidal thoughts and audiovisual hallucinations. She is in agreement to continue her current plan of care as already in progress. She is encouraged to drink plenty of water to help combat the dry mouth.  From Admission H&P: Patient is a 34 year old female with the above stated past psychiatric history who was admitted secondary to suicidal ideation, depression anxiety.  She will be admitted to the hospital.  She will be integrated into the milieu.  She will be encouraged  to attend groups.  We will go on and stop the fluoxetine, and I am going to return to sertraline given its success in the past.  We will start that at 25 mg p.o. daily and titrate that.  She will also be placed on buspirone 10 mg p.o. 3 times daily, and this to be an increase during the course of the hospitalization.  She has a past medical history significant for irritable bowel syndrome diarrhea type, and this will be monitored.  She also has a reported history of asthma and albuterol will be continued.  We will also continue her propranolol as well as her Metformin. We will also explore the possibility of previous trauma given her IBS diagnosis.  After discharge she clearly needs to be in either cognitive behavioral therapy or dialectic behavioral therapy.  Review of her laboratories showed essentially normal electrolytes, normal liver function enzymes.  Her cholesterol is mildly elevated at 204 and her LDL is elevated at 134.  Her triglycerides are elevated at 146.  Her TSH is 4.628.  Drug screen was negative.  She had an MRI of the brain done on 2/24.  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  Her EKG showed a normal sinus rhythm with an normal QTc interval.   Total Time spent with patient: 15 minutes  Past Psychiatric History: See admission H&P  Past Medical History:  Past Medical History:  Diagnosis Date  . Anxiety 2013   treated at  [facility] by Pauline Good   . Asthma 1989    no hospitalization, or intubation, previously on advair and singulair. asthma worsening.   . 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 Depression 2001   depressed since childhood   . 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 Essential hypertension 03/12/2019  . Kyphosis of thoracic region 2004    painful, runs in dad side   . Migraine   . Pancreatitis 2003   gallstone  pancreatitis   . Pancreatitis due to biliary obstruction 2003  . PCOS (polycystic ovarian syndrome) 2014   facial hair, irregular periods, never been pregnant     Past Surgical History:  Procedure Laterality Date  . CHOLECYSTECTOMY  2003   . OPEN REDUCTION INTERNAL FIXATION (ORIF) PROXIMAL PHALANX Right 07/23/2013   Procedure: OPEN TREATMENT RIGHT RING FINGER, PROXIMAL INTERPHALANGEAL/JOINT FRACTURE DISLOCATION;  Surgeon: Jolyn Nap, MD;  Location: [facility];  Service: Orthopedics;  Laterality: Right;  . TONSILLECTOMY     Family History:  Family History  Problem Relation Age of Onset  . Hypertension Mother   . Arnold-Chiari malformation Mother   . Restless legs syndrome Mother   . Osteoporosis Mother   . COPD Mother   . Asthma Mother   . Squamous cell carcinoma Mother        skin   . Eczema Mother   . Arthritis Mother   . Peripheral Artery Disease Mother   . Hypertension Father   . Scoliosis Father   . Bipolar disorder Father   . Congestive Heart Failure Father   . COPD Father   . Diabetes Maternal Grandmother   . Hypertension Maternal Grandmother   . Cancer Paternal Grandmother        colon  . Bone cancer Maternal Grandfather 82       bone marrow   . Multiple myeloma Maternal Grandfather   . Diabetes Paternal Grandfather    Family Psychiatric  History: See admission H&P  Social History:  Social History   Substance and Sexual Activity  Alcohol Use No     Social History   Substance and Sexual Activity  Drug Use No    Social History   Socioeconomic History  . Marital status: Single    Spouse name: Not on file  . Number of children: 0  . Years of education: college   . Highest education level: Not on file  Occupational History  . Occupation: Unemployed   Tobacco Use  . Smoking status: Never Smoker  . Smokeless tobacco: Never Used  Substance and Sexual Activity  . Alcohol use: No  . Drug use: No  . Sexual activity: Yes    Birth  control/protection: None  Other Topics Concern  . Not on file  Social History Narrative   Lives with mom Kristen [name])    Right-handed   Caffeine: 3 cups of coffee per day   Social Determinants of Health   Financial Resource Strain:   . Difficulty of Paying Living Expenses:   Food Insecurity:   . Worried About Charity fundraiser in the Last Year:   . Arboriculturist in the Last Year:   Transportation Needs:   . Film/video editor (Medical):   Marland Kitchen Lack of Transportation (Non-Medical):   Physical Activity:   . Days of Exercise per Week:   . Minutes of Exercise per Session:   Stress:   . Feeling of Stress :  Social Connections:   . Frequency of Communication with Friends and Family:   . Frequency of Social Gatherings with Friends and Family:   . Attends Religious Services:   . Active Member of Clubs or Organizations:   . Attends Archivist Meetings:   Marland Kitchen Marital Status:    Additional Social History:    Pain Medications: see MAR Prescriptions: see MAR Over the Counter: see MAR History of alcohol / drug use?: No history of alcohol / drug abuse  Sleep: Good  Appetite:  Good  Current Medications: Current Facility-Administered Medications  Medication Dose Route Frequency Provider Last Rate Last Admin  . acetaminophen (TYLENOL) tablet 650 mg  650 mg Oral Q6H PRN Anike, Adaku C, NP      . albuterol (VENTOLIN HFA) 108 (90 Base) MCG/ACT inhaler 1-2 puff  1-2 puff Inhalation Q6H PRN Sharma Covert, MD      . alum & mag hydroxide-simeth (MAALOX/MYLANTA) 200-200-20 MG/5ML suspension 30 mL  30 mL Oral Q4H PRN Anike, Adaku C, NP      . busPIRone (BUSPAR) tablet 10 mg  10 mg Oral BID Sharma Covert, MD   10 mg at 04/17/19 0900  . hydrOXYzine (ATARAX/VISTARIL) tablet 25 mg  25 mg Oral TID PRN Anike, Adaku C, NP   25 mg at 04/16/19 2117  . magnesium hydroxide (MILK OF MAGNESIA) suspension 30 mL  30 mL Oral Daily PRN Anike, Adaku C, NP      . naproxen (NAPROSYN) tablet  500 mg  500 mg Oral BID WC Sharma Covert, MD   500 mg at 04/17/19 0900  . propranolol (INDERAL) tablet 20 mg  20 mg Oral BID Sharma Covert, MD   20 mg at 04/17/19 0900  . sertraline (ZOLOFT) tablet 25 mg  25 mg Oral Daily Sharma Covert, MD   25 mg at 04/17/19 0900  . traZODone (DESYREL) tablet 50 mg  50 mg Oral QHS PRN Anike, Adaku C, NP   50 mg at 04/16/19 2117   Lab Results:  No results found for this or any previous visit (from the past 48 hour(s)).  Blood Alcohol level:  Lab Results  Component Value Date   ETH <10 16/11/9602   Metabolic Disorder Labs: Lab Results  Component Value Date   HGBA1C 4.9 04/15/2019   MPG 93.93 04/15/2019   Lab Results  Component Value Date   PROLACTIN 15.9 09/18/2016   PROLACTIN 32.9 (H) 06/20/2016   Lab Results  Component Value Date   CHOL 204 (H) 04/15/2019   TRIG 146 04/15/2019   HDL 41 04/15/2019   CHOLHDL 5.0 04/15/2019   VLDL 29 04/15/2019   LDLCALC 134 (H) 04/15/2019   Physical Findings: AIMS:  , ,  ,  ,    CIWA:    COWS:     Musculoskeletal: Strength & Muscle Tone: within normal limits Gait & Station: normal Patient leans: N/A  Psychiatric Specialty Exam: Physical Exam  Constitutional: She is oriented to person, place, and time. She appears well-developed and well-nourished. No distress.  HENT:  Head: Normocephalic.  Right Ear: External ear normal.  Cardiovascular: Normal rate.  Respiratory: Effort normal. No respiratory distress.  Genitourinary:    Genitourinary Comments: Deferred   Musculoskeletal:        General: Normal range of motion.     Cervical back: Normal range of motion.  Neurological: She is alert and oriented to person, place, and time.  Skin: Skin is warm and dry. She is  not diaphoretic.  Psychiatric: Her mood appears anxious. She is not withdrawn and not actively hallucinating. Thought content is not paranoid and not delusional. She exhibits a depressed mood. She expresses no homicidal and  no suicidal ideation.    Review of Systems  Constitutional: Negative for activity change, appetite change, chills, diaphoresis, fatigue and fever.  HENT: Negative for congestion, rhinorrhea, sneezing and sore throat.   Respiratory: Negative for cough and shortness of breath.   Cardiovascular: Negative for chest pain.  Gastrointestinal: Negative for diarrhea, nausea and vomiting.  Genitourinary: Negative for difficulty urinating.  Musculoskeletal: Negative for myalgias.  Skin: Negative for color change.  Neurological: Negative for dizziness and headaches.  Psychiatric/Behavioral: Positive for dysphoric mood ( "Imroving"). Negative for hallucinations, self-injury, sleep disturbance and suicidal ideas. The patient is not nervous/anxious (Improved.).   All other systems reviewed and are negative.   Blood pressure (!) 143/102, pulse 83, temperature 98 F (36.7 C), temperature source Oral, resp. rate 16, height 5' 6.5" (1.689 m), weight (!) 143.9 kg, SpO2 100 %.Body mass index is 50.45 kg/m.  General Appearance: Casual  Eye Contact:  Fair  Speech:  Clear and Coherent and Normal Rate  Volume:  Normal  Mood:  "Improving"  Affect:  Appropriate  Thought Process:  Coherent, Linear and Descriptions of Associations: Intact  Orientation:  Full (Time, Place, and Person)  Thought Content:  Logical and Hallucinations: None  Suicidal Thoughts:  No  Homicidal Thoughts:  No  Memory:  Immediate;   Good Recent;   Good Remote;   Good  Judgement:  Intact  Insight:  Fair  Psychomotor Activity:  Normal  Concentration:  Concentration: Good and Attention Span: Good  Recall:  Good  Fund of Knowledge:  Good  Language:  Good  Akathisia:  Negative  Handed:  Right  AIMS (if indicated):     Assets:  Communication Skills Desire for Improvement Housing Leisure Time Resilience  ADL's:  Intact  Cognition:  WNL  Sleep:  Number of Hours: 6.75   Treatment Plan Summary: Daily contact with patient to assess  and evaluate symptoms and progress in treatment and Medication management   - Continue inpatient admission for stabilization and treatment.  - Will continue today 04/17/2019 plan as below except where it is noted.  - Continue buspirone 10 mg TID for anxiety - Continue sertraline 25 mg daily for depression/anxiety - Continue propanolol 20 mg BID for hypertension - Continue hydroxyzine 25 mg TID prn for anxiety - Continue albuterol INH 1-2 puffs every 6 hours prn for wheezing/SOB - Continue trazodone 50 mg QHS prn for sleep.  - Patient to attend & participate in the group milieu.  - Discharge disposition in progress.  Lindell Spar, NP, PMHNP, FNP-BC 04/17/2019, 1:03 PMPatient ID: Waynette Buttery, female   DOB: 1985/06/25, 34 y.o.   MRN: [id number]

## 2019-04-18 MED ORDER — TRAZODONE HCL 50 MG PO TABS: 50.0000 mg | ORAL_TABLET | Freq: Every evening | ORAL | 0 refills | Status: DC | PRN

## 2019-04-18 MED ORDER — NAPROXEN 500 MG PO TABS: 500.0000 mg | ORAL_TABLET | Freq: Two times a day (BID) | ORAL | Status: DC

## 2019-04-18 MED ORDER — BUSPIRONE HCL 10 MG PO TABS: 10.0000 mg | ORAL_TABLET | Freq: Two times a day (BID) | ORAL | 0 refills | Status: DC

## 2019-04-18 MED ORDER — PROPRANOLOL HCL 20 MG PO TABS: 20.0000 mg | ORAL_TABLET | Freq: Two times a day (BID) | ORAL | 0 refills | Status: DC

## 2019-04-18 MED ORDER — HYDROXYZINE HCL 25 MG PO TABS: 25.0000 mg | ORAL_TABLET | Freq: Three times a day (TID) | ORAL | 0 refills | Status: DC | PRN

## 2019-04-18 MED ORDER — SERTRALINE HCL 25 MG PO TABS: 25.0000 mg | ORAL_TABLET | Freq: Every day | ORAL | 0 refills | Status: DC

## 2019-04-18 NOTE — Progress Notes (Signed)
  [facility] Adult Case Management Discharge Plan :  Will you be returning to the same living situation after discharge:  Yes,  home with parent At discharge, do you have transportation home?: Yes,  arranged by patient Do you have the ability to pay for your medications: Yes,  insurance and income  Release of information consent forms completed and emailed to Medical Records, then turned in to Medical Records by CSW.   Patient to Follow up at: Follow-up Information    [facility] Follow up on 05/05/2019.   Specialty: Behavioral Health Why: You have an appointment scheduled for 05-05-2019 at 11 am with Dr. [name] Mustache. You also have an appointment for therapy on 05/11/19 @10  am with Leanne. These will be virtual appointments.  Contact information: [address] [place] [phone]          Next level of care provider has access to [facility] Link:yes  Safety Planning and Suicide Prevention discussed: Yes,  with mother     Has patient been referred to the Quitline?: N/A patient is not a smoker  Patient has been referred for addiction treatment: N/A  [facility], LCSW 04/18/2019, 11:42 AM

## 2019-04-18 NOTE — Progress Notes (Signed)
D:  Patient denied SI and HI, contracts for safety.  Denied A/V hallucinations.  Denied pain. A:  Medications administered per MD orders.  Emotional support and encouragement given patient. R:  Safety maintained with 15 minute checks.  

## 2019-04-18 NOTE — [facility] Group Notes (Signed)
[facility] LCSW Group Therapy Note  04/18/2019    Type of Therapy and Topic:  Group Therapy:  Adding Supports Including Yourself  Participation Level:  Active   Description of Group:   Patients in this group were introduced to the concept that additional supports including self-support are an essential part of recovery.  Patients listed what supports they believe they need to add to their lives to achieve their goals at discharge, and they listed such things as therapist, family, doctor, support groups, 12-step groups and service animals.   A song entitled "My Own Hero" was played and a group discussion ensued in which patients stated they could relate to the song and it inspired them to realize they have be willing to help themselves in order to succeed, because other people cannot achieve sobriety or stability for them.  "Fight For It" was played, then "I Am Enough" to encourage patients.  They discussed the impact on them and how they must remain convinced that their lives are worth the effort it takes to become sober and/or stable.  Therapeutic Goals: 1)  demonstrate the importance of being a key part of one's own support system 2)  discuss various available supports 3)  encourage patient to use music as part of their self-support and focus on goals 4)  elicit ideas from patients about supports that need to be added   Summary of Patient Progress:  The patient expressed that she really needs to work on her life-work balance.  She participated fully throughout group.   Therapeutic Modalities:   Motivational Interviewing Activity  Carloyn Jaeger Grossman-Orr  8:51 AM

## 2019-04-18 NOTE — Discharge Summary (Signed)
Physician Discharge Summary Note  Patient:  Kristen [name] is an 34 y.o., female MRN:  [id number] DOB:  [birth year] Patient phone:  [phone] (home)  Patient address:   [place]  [address],  Total Time spent with patient: Greater than 30 minutes  Date of Admission:  04/14/2019 Date of Discharge: 04-18-19  Reason for Admission: Random break-down & work & suicidal ideations.  Principal Problem: MDD (major depressive disorder), recurrent severe, without psychosis ([place])  Discharge Diagnoses: Principal Problem:   MDD (major depressive disorder), recurrent severe, without psychosis ([place]) Active Problems:   Anxiety and depression  Past Psychiatric History: Major depressive disorder.  Past Medical History:  Past Medical History:  Diagnosis Date  . Anxiety 2013   treated at [facility] by Pauline Good   . Asthma 1989    no hospitalization, or intubation, previously on advair and singulair. asthma worsening.   . 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 Depression 2001   depressed since childhood   . 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 Essential hypertension 03/12/2019  . Kyphosis of thoracic region 2004    painful, runs in dad side   . Migraine   . Pancreatitis 2003   gallstone pancreatitis   . Pancreatitis due to biliary obstruction 2003  . PCOS (polycystic ovarian syndrome) 2014   facial hair, irregular periods, never been pregnant     Past Surgical History:  Procedure Laterality Date  . CHOLECYSTECTOMY  2003   . OPEN REDUCTION INTERNAL FIXATION (ORIF) PROXIMAL PHALANX Right 07/23/2013   Procedure: OPEN TREATMENT RIGHT RING FINGER, PROXIMAL INTERPHALANGEAL/JOINT FRACTURE DISLOCATION;  Surgeon: Jolyn Nap, MD;  Location: [facility];  Service: Orthopedics;  Laterality: Right;  . TONSILLECTOMY     Family History:  Family History  Problem Relation Age of Onset  . Hypertension Mother   . Arnold-Chiari malformation Mother   .  Restless legs syndrome Mother   . Osteoporosis Mother   . COPD Mother   . Asthma Mother   . Squamous cell carcinoma Mother        skin   . Eczema Mother   . Arthritis Mother   . Peripheral Artery Disease Mother   . Hypertension Father   . Scoliosis Father   . Bipolar disorder Father   . Congestive Heart Failure Father   . COPD Father   . Diabetes Maternal Grandmother   . Hypertension Maternal Grandmother   . Cancer Paternal Grandmother        colon  . Bone cancer Maternal Grandfather 82       bone marrow   . Multiple myeloma Maternal Grandfather   . Diabetes Paternal Grandfather    Family Psychiatric  History: See H&P  Social History:  Social History   Substance and Sexual Activity  Alcohol Use No     Social History   Substance and Sexual Activity  Drug Use No    Social History   Socioeconomic History  . Marital status: Single    Spouse name: Not on file  . Number of children: 0  . Years of education: college   . Highest education level: Not on file  Occupational History  . Occupation: Unemployed   Tobacco Use  . Smoking status: Never Smoker  . Smokeless tobacco: Never Used  Substance and Sexual Activity  . Alcohol use: No  . Drug use: No  . Sexual activity: Yes    Birth control/protection: None  Other Topics Concern  . Not on file  Social History Narrative   Lives with mom Kristen [name])    Right-handed   Caffeine: 3 cups of coffee per day   Social Determinants of Health   Financial Resource Strain:   . Difficulty of Paying Living Expenses:   Food Insecurity:   . Worried About Charity fundraiser in the Last Year:   . Arboriculturist in the Last Year:   Transportation Needs:   . Film/video editor (Medical):   Marland Kitchen Lack of Transportation (Non-Medical):   Physical Activity:   . Days of Exercise per Week:   . Minutes of Exercise per Session:   Stress:   . Feeling of Stress :   Social Connections:   . Frequency of Communication with Friends and  Family:   . Frequency of Social Gatherings with Friends and Family:   . Attends Religious Services:   . Active Member of Clubs or Organizations:   . Attends Archivist Meetings:   Marland Kitchen Marital Status:    Hospital Course: (Per Md's admission evaluation notes): Patient is a 34 year old female with a reported past psychiatric history significant for generalized anxiety and depression who presented as a walk-in evaluation to the behavioral health hospital on 04/14/2019. The patient stated that she was having suicidal ideation. She stated that she was having random breakdowns at work. She stated she was under significant stress. 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 She previously was diagnosed with anxiety depression as a child and was treated with Zoloft. She stated she felt as though it it worked at one time, but then "stopped working". She started having increased stress approximately 1 to 2 months ago, and was evaluated. She was started on fluoxetine, and this was increased to 20 mg a day. She was seen by Dr. Bennetta Laos 04/09/2019. Her fluoxetine was increased, and she was placed on FMLA. They also discussed possibly getting neurocognitive testing because of her concern for underlying autism in her case. 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 She stated that all the additional work would fall on her, and she was unable to cope with that. She discussed having crying spells, having to go to the bathroom a great deal just to have these breakdowns. She stated that she had previously cut when she was younger, but now "suppresses that". She admitted to helplessness, hopelessness and worthlessness. She was admitted to the hospital for evaluation and stabilization.  After the above admission  evaluation, patient's presenting symptoms were identified. She was recommended for mood stabilization treatments. Then, the medication regimen targeting those presenting symptoms were discussed with her & initiated with her consent. She was medicated, stabilized & discharged on the medications as listed on her discharge medication lists below. Besides the mood stabilization treatments, Amberly was also enrolled & participated in the group counseling sessions being offered & held on this unit. She learned coping skills. She also presented other significant pre-existing medical issues that required treatment including the use of C-pap machine. She was resumed & discharged on all her pertinent home medications for those health issues.  Arnesia/s symptoms responded well to her treatment regimen. This is evidenced by her reports of improved mood & absence of suicidal ideations. During the course of her hospitalization, the  15-minute checks were adequate to ensure Pearley's safety.  Patient did not display any dangerous violent or suicidal behavior on the unit.  She interacted with patients & staff appropriately, participated appropriately in the group sessions/therapies. Her medications were addressed & adjusted to meet her needs. She was recommended for outpatient follow-up care & medication management upon discharge to assure continuity of care.  At the time of discharge patient is not reporting any acute suicidal/homicidal ideations. She feels more confident about her self-care & in managing the suicidal. She currently denies any new issues or concerns. Education and supportive counseling provided throughout her hospital stay & upon discharge.  Today upon her discharge evaluation with the attending psychiatrist, Shanea shares she is doing well. She denies any other specific concerns. She is sleeping well. Her appetite is good. She denies other physical complaints. She denies AH/VH. She feels that her  medications have been helpful & is in agreement to continue her current treatment regimen. She was able to engage in safety planning including plan to return to [facility] or contact emergency services if she feels unable to maintain her own safety or the safety of others. Pt had no further questions, comments, or concerns. She left [facility] with all personal belongings in no apparent distress. Transportation per patient's arrangement.   Physical Findings: AIMS: Facial and Oral Movements Muscles of Facial Expression: None, normal Lips and Perioral Area: None, normal Jaw: None, normal Tongue: None, normal,Extremity Movements Upper (arms, wrists, hands, fingers): None, normal Lower (legs, knees, ankles, toes): None, normal, Trunk Movements Neck, shoulders, hips: None, normal, Overall Severity Severity of abnormal movements (highest score from questions above): None, normal Incapacitation due to abnormal movements: None, normal Patient's awareness of abnormal movements (rate only patient's report): No Awareness, Dental Status Current problems with teeth and/or dentures?: No Does patient usually wear dentures?: No  CIWA:    COWS:     Musculoskeletal: Strength & Muscle Tone: within normal limits Gait & Station: normal Patient leans: N/A  Psychiatric Specialty Exam: Physical Exam  Nursing note and vitals reviewed. Constitutional: She is oriented to person, place, and time. She appears well-developed.  Cardiovascular: Normal rate.  Respiratory: Effort normal. No respiratory distress. She has no wheezes. She has no rales.  Genitourinary:    Genitourinary Comments: Deferred   Musculoskeletal:        General: Normal range of motion.     Cervical back: Normal range of motion.  Neurological: She is alert and oriented to person, place, and time.  Skin: Skin is warm and dry.    Review of Systems  Constitutional: Negative for chills, diaphoresis and fever.  HENT: Negative for congestion, rhinorrhea,  sneezing and sore throat.   Respiratory: Negative for cough, shortness of breath and wheezing.   Cardiovascular: Negative for chest pain and palpitations.  Gastrointestinal: Negative for diarrhea, nausea and vomiting.  Genitourinary: Negative for difficulty urinating.  Musculoskeletal: Negative for myalgias.  Skin: Negative for color change.  Allergic/Immunologic:       Allergies: PCN, mucinex  Neurological: Negative for dizziness, tremors, seizures, syncope and headaches.  Psychiatric/Behavioral: Positive for dysphoric mood and sleep disturbance (Stabilized with medication prior to discharge). Negative for agitation, behavioral problems, confusion, decreased concentration (Stabilized with medication prior to discharge), hallucinations, self-injury and suicidal ideas. The patient is not nervous/anxious (Stable) and is not hyperactive.     Blood pressure 111/81, pulse 93, temperature 98.2 F (36.8 C), temperature source Oral, resp. rate 16, height 5' 6.5" (1.689 m), weight (!) 143.9 kg,  SpO2 100 %.Body mass index is 50.45 kg/m.  See Md's discharge SRA  Sleep:  Number of Hours: 5   Has this patient used any form of tobacco in the last 30 days? (Cigarettes, Smokeless Tobacco, Cigars, and/or Pipes)" N/A  Blood Alcohol level:  Lab Results  Component Value Date   ETH <10 16/11/9602   Metabolic Disorder Labs:  Lab Results  Component Value Date   HGBA1C 4.9 04/15/2019   MPG 93.93 04/15/2019   Lab Results  Component Value Date   PROLACTIN 15.9 09/18/2016   PROLACTIN 32.9 (H) 06/20/2016   Lab Results  Component Value Date   CHOL 204 (H) 04/15/2019   TRIG 146 04/15/2019   HDL 41 04/15/2019   CHOLHDL 5.0 04/15/2019   VLDL 29 04/15/2019   LDLCALC 134 (H) 04/15/2019   See Psychiatric Specialty Exam and Suicide Risk Assessment completed by Attending Physician prior to discharge.  Discharge destination:  Home  Is patient on multiple antipsychotic therapies at discharge:  No   Has  Patient had three or more failed trials of antipsychotic monotherapy by history:  No  Recommended Plan for Multiple Antipsychotic Therapies: NA  Allergies as of 04/18/2019      Reactions   Mucinex [guaifenesin Er] Hives, Itching, Swelling   Penicillins Swelling   Did it involve swelling of the face/tongue/throat, SOB, or low BP? Yes Did it involve sudden or severe rash/hives, skin peeling, or any reaction on the inside of your mouth or nose? No Did you need to seek medical attention at a hospital or doctor's office? Yes When did it last happen?>10 years ago If all above answers are "NO", may proceed with cephalosporin use.   Contrast Media [iodinated Diagnostic Agents] Nausea And Vomiting   Multihance [gadobenate] Nausea And Vomiting      Medication List    STOP taking these medications   FLUoxetine 20 MG capsule Commonly known as: PROzac   letrozole 2.5 MG tablet Commonly known as: FEMARA   medroxyPROGESTERone 10 MG tablet Commonly known as: PROVERA     TAKE these medications     Indication  albuterol 108 (90 Base) MCG/ACT inhaler Commonly known as: VENTOLIN HFA Inhale 2 puffs into the lungs every 6 (six) hours as needed for wheezing or shortness of breath. What changed: Another medication with the same name was removed. Continue taking this medication, and follow the directions you see here.  Indication: Asthma   busPIRone 10 MG tablet Commonly known as: BUSPAR Take 1 tablet (10 mg total) by mouth 2 (two) times daily. For anxiety  Indication: Anxiety Disorder   hydrOXYzine 25 MG tablet Commonly known as: ATARAX/VISTARIL Take 1 tablet (25 mg total) by mouth 3 (three) times daily as needed for anxiety.  Indication: Feeling Anxious   naproxen 500 MG tablet Commonly known as: NAPROSYN Take 1 tablet (500 mg total) by mouth 2 (two) times daily with a meal. (May buy from over the counter): For pain  Indication: Pain   propranolol 20 MG tablet Commonly known as:  INDERAL Take 1 tablet (20 mg total) by mouth 2 (two) times daily. For anxiety What changed:   medication strength  additional instructions  Indication: Feeling Anxious   sertraline 25 MG tablet Commonly known as: ZOLOFT Take 1 tablet (25 mg total) by mouth daily. For depression Start taking on: April 19, 2019  Indication: Major Depressive Disorder   traZODone 50 MG tablet Commonly known as: DESYREL Take 1 tablet (50 mg total) by mouth at bedtime as  needed for sleep.  Indication: [facility] Follow up on 05/05/2019.   Specialty: Behavioral Health Why: You have an appointment scheduled for 05-05-2019 at 11 am with Dr. Adele Schilder. You also have an appointment for therapy on 05/11/19 @10  am with Leanne. These will be virtual appointments.  Contact information: [place] [phone]         Follow-up recommendations: Activity:  As tolerated Diet: As recommended by your primary care doctor. Keep all scheduled follow-up appointments as recommended.    Comments: Prescriptions given at discharge.  Patient agreeable to plan.  Given opportunity to ask questions.  Appears to feel comfortable with discharge denies any current suicidal or homicidal thought. Patient is also instructed prior to discharge to: Take all medications as prescribed by his/her mental healthcare provider. Report any adverse effects and or reactions from the medicines to his/her outpatient provider promptly. Patient has been instructed & cautioned: To not engage in alcohol and or illegal drug use while on prescription medicines. In the event of worsening symptoms, patient is instructed to call the crisis hotline, 911 and or go to the nearest ED for appropriate evaluation and treatment of symptoms. To follow-up with his/her primary care provider for your other medical issues, concerns and  or health care needs.  Signed: Lindell Spar, NP, PMHNP, FNP-BC 04/18/2019, 9:55 AM

## 2019-04-18 NOTE — Progress Notes (Signed)
Discharge Note:  Patient discharged home with mother.  Patient denied SI and HI.  Denied A/V hallucinations.  Suicide prevention information given and discussed with patient who stated she understood and had no questions.  Patient stated she received all her belongings, clothing, toiletries, etc.  Patient stated she appreciated all assistance received from [facility] staff.  All required discharge information given to patient at discharge.

## 2019-04-18 NOTE — BHH Suicide Risk Assessment (Signed)
[facility] Discharge Suicide Risk Assessment   Principal Problem: MDD (major depressive disorder), recurrent severe, without psychosis ([facility]) Discharge Diagnoses: Principal Problem:   MDD (major depressive disorder), recurrent severe, without psychosis ([facility]) Active Problems:   Anxiety and depression   Total Time spent with patient: 15 minutes  Musculoskeletal: Strength & Muscle Tone: within normal limits Gait & Station: normal Patient leans: N/A  Psychiatric Specialty Exam: Review of Systems  All other systems reviewed and are negative.   Blood pressure 111/81, pulse 93, temperature 98.2 F (36.8 C), temperature source Oral, resp. rate 16, height 5' 6.5" (1.689 m), weight (!) 143.9 kg, SpO2 100 %.Body mass index is 50.45 kg/m.  General Appearance: Casual  Eye Contact::  Fair  Speech:  Normal Rate409  Volume:  Normal  Mood:  Euthymic  Affect:  Congruent  Thought Process:  Coherent and Descriptions of Associations: Intact  Orientation:  Full (Time, Place, and Person)  Thought Content:  Logical  Suicidal Thoughts:  No  Homicidal Thoughts:  No  Memory:  Immediate;   Good Recent;   Good Remote;   Good  Judgement:  Intact  Insight:  Fair  Psychomotor Activity:  Normal  Concentration:  Good  Recall:  Good  Fund of Knowledge:Good  Language: Good  Akathisia:  Negative  Handed:  Right  AIMS (if indicated):     Assets:  Communication Skills Desire for Improvement Financial Resources/Insurance Housing Resilience Social Support Talents/Skills Transportation  Sleep:  Number of Hours: 5  Cognition: WNL  ADL's:  Intact   Mental Status Per Nursing Assessment::   On Admission:  Suicide plan  Demographic Factors:  Caucasian  Loss Factors: Financial problems/change in socioeconomic status  Historical Factors: Impulsivity  Risk Reduction Factors:   Sense of responsibility to family, Living with another person, especially a relative, Positive social support and Positive  therapeutic relationship  Continued Clinical Symptoms:  Depression:   Impulsivity  Cognitive Features That Contribute To Risk:  None    Suicide Risk:  Minimal: No identifiable suicidal ideation.  Patients presenting with no risk factors but with morbid ruminations; may be classified as minimal risk based on the severity of the depressive symptoms  Follow-up Information    [facility] Follow up on 05/05/2019.   Specialty: Behavioral Health Why: You have an appointment scheduled for 05-05-2019 at 11 am with Dr. [name] Mustache. You also have an appointment for therapy on 05/11/19 @10  am with Leanne. These will be virtual appointments.  Contact information: [address] [place] [phone]          Plan Of Care/Follow-up recommendations:  Activity:  ad lib Other:  Contact PCP about mildly elevated TSH found in hospital  [phone], MD 04/18/2019, 9:29 AM

## 2019-04-19 MED FILL — busPIRone HCL 10 MG TABS: 10 | 30 days supply | Qty: 60 | Fill #0

## 2019-04-19 MED FILL — traZODone HCL 50 MG TABS: 50 | 30 days supply | Qty: 30 | Fill #0

## 2019-04-19 MED FILL — hydrOXYzine HCL 25 MG TABS: 25 | 25 days supply | Qty: 75 | Fill #0

## 2019-04-19 MED FILL — SERTRALINE HCL 25 MG TABLET: 25 | 30 days supply | Qty: 30 | Fill #0

## 2019-04-19 MED FILL — PROPRANOLOL 20 MG TABLET: 20 | 30 days supply | Qty: 60 | Fill #0

## 2019-04-21 ENCOUNTER — Telehealth: Payer: Self-pay

## 2019-04-21 NOTE — Telephone Encounter (Signed)
Pt came in to the office today to drop off ADA forms to be completed by pcp. Made pt aware that paperwork takes 7-10 business days for completion.   Pt is requesting that forms be completed and faxed to [facility] at  [phone]

## 2019-04-23 ENCOUNTER — Ambulatory Visit: Payer: No Typology Code available for payment source | Attending: Internal Medicine | Admitting: Internal Medicine

## 2019-04-23 ENCOUNTER — Other Ambulatory Visit: Payer: Self-pay

## 2019-04-23 ENCOUNTER — Encounter: Payer: Self-pay | Admitting: Internal Medicine

## 2019-04-23 VITALS — BP 138/87 | HR 68 | Temp 97.5°F | Resp 16 | Wt 319.6 lb

## 2019-04-23 DIAGNOSIS — R7989 Other specified abnormal findings of blood chemistry: Secondary | ICD-10-CM | POA: Insufficient documentation

## 2019-04-23 DIAGNOSIS — F329 Major depressive disorder, single episode, unspecified: Secondary | ICD-10-CM | POA: Diagnosis not present

## 2019-04-23 DIAGNOSIS — K580 Irritable bowel syndrome with diarrhea: Secondary | ICD-10-CM | POA: Diagnosis not present

## 2019-04-23 DIAGNOSIS — F419 Anxiety disorder, unspecified: Secondary | ICD-10-CM | POA: Diagnosis not present

## 2019-04-23 NOTE — Progress Notes (Signed)
Patient ID: Kristen [name], female    DOB: September 07, 1985  MRN: [id number]  CC: Hospitalization Follow-up and paperwork for ADA   Subjective: Kristen [name] is a 34 y.o. female who presents for hosp f/u Her concerns today include:  Pt with hx of PCOS, morbid obesity, Dep, RLS, social phobia, lymphocytic hypophysitis, hyperprolactinemia, severe persistent asthma, migraines, OSA CPAP.  Patient last seen by me 03/12/2019 to reestablish care.  On that visit she complained of significant issues with depression and anxiety.  We started her on Prozac, referred her to psychiatry and she was also seen by our LCSW. -Since last visit, she has seen psychiatrist Dr. Adele Schilder.  He increase the Prozac to 20 mg and discussion about having her do FMLA leaves from work when she has flareup of symptoms.  He also recommended that she have neurocognitive testing to rule out autism.  This has not been done as yet as there was some difficulty in finding a neuropsychologist.  Since then patient was hospitalized 3/10-14/2021 [facility] for MDD with suicidal ideation and mental breakdown at work.  Medications were changed.  Prozac discontinued.  She was placed on Zoloft, trazodone, BuSpar and hydroxyzine. -She tells me that she felt that she was doing okay on the medications while she was hospitalized but now that she has been released she is beginning to feel overwhelmed again.  She reports feeling out of it yesterday while in the grocery store with her mother.  She states that her mother was talking to her and other people around her were talking but she only heard the sounds of the grocery carts.  She feels overwhelmed when there is too much noise or stimulus in her environment. -She feels that she is better at arranging her thoughts or words on paper than in speaking.  She feels fidgety, forgetful, anxious, depressed, nervous, hyperstimulated and socially awkward.  She has racing thoughts.  Not  sleeping well.  "I have many breakdowns every morning."  She had 1 of these breakdowns at work last Wednesday where she had to call her mother and fianc and was taken to the mental health hospital.   She thinks she has an appointment coming up with Dr. Adele Schilder later this month but I do not see an appointment in the system. -She has form with her today to request reasonable accommodation for work.  She has been out of work since she was admitted 04/14/2019 and was supposed to go back to work on 04/26/2019.  She would like to work part-time and have less of the workload.  She is currently working as a Technical brewer for [employer].  Her duties include rooming patients and cleaning the rooms after patients leave, taking care of the medicine closet, drawing up medications and getting prior authorization from insurance companies.  She finds it very overwhelming being the CMA for 4 medical providers.  She would like to be out of work for the next 2 weeks.  Other concern today is IBS-D.  States that she was diagnosed with this at the age of 81.  She has bloating and diarrhea about 10 minutes after meals.  Worse after she eats leafy foods like lettuce.  She goes to the bathroom up to 4 times a day.  Diarrhea started after gallbladder was removed.  She takes Imodium at least once a day.  She wanted to know whether the thyroid needs to be checked.  TSH was checked while she was in the hospital  and was mildly elevated at 4.6 with the upper limit of normal being 4.5.  Gives family history of thyroid disease in her mother. Patient Active Problem List   Diagnosis Date Noted  . MDD (major depressive disorder), recurrent severe, without psychosis ([place]) 04/15/2019  . OSA on CPAP 03/17/2019  . Anxiety and depression 03/12/2019  . Chronic migraine with aura 03/12/2019  . Essential hypertension 03/12/2019  . Mild intermittent asthma without complication 48/18/5631  . Lymphocytic hypophysitis ([place]) 08/15/2016  . Elevated prolactin  level 07/05/2016  . Vulvar lesion 07/05/2016  . Restless legs 05/24/2016  . Seasonal allergies 05/13/2014  . Intertrigo 05/13/2014  . Decreased vision 05/13/2014  . PCOS (polycystic ovarian syndrome) 02/07/2014  . Social phobia 02/02/2007  . ADD 02/02/2007  . 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 Asthma, severe persistent 02/02/2007  . SCOLIOSIS 02/02/2007  . OTHER SPECIFIED CONGENITAL ANOMALIES OF KIDNEY 02/02/2007  . ELECTROCARDIOGRAM, ABNORMAL 02/02/2007     Current Outpatient Medications on File Prior to Visit  Medication Sig Dispense Refill  . albuterol (VENTOLIN HFA) 108 (90 Base) MCG/ACT inhaler Inhale 2 puffs into the lungs every 6 (six) hours as needed for wheezing or shortness of breath. 6.7 g 3  . busPIRone (BUSPAR) 10 MG tablet Take 1 tablet (10 mg total) by mouth 2 (two) times daily. For anxiety 60 tablet 0  . hydrOXYzine (ATARAX/VISTARIL) 25 MG tablet Take 1 tablet (25 mg total) by mouth 3 (three) times daily as needed for anxiety. 75 tablet 0  . naproxen (NAPROSYN) 500 MG tablet Take 1 tablet (500 mg total) by mouth 2 (two) times daily with a meal. (May buy from over the counter): For pain    . propranolol (INDERAL) 20 MG tablet Take 1 tablet (20 mg total) by mouth 2 (two) times daily. For anxiety 60 tablet 0  . sertraline (ZOLOFT) 25 MG tablet Take 1 tablet (25 mg total) by mouth daily. For depression 30 tablet 0  . traZODone (DESYREL) 50 MG tablet Take 1 tablet (50 mg total) by mouth at bedtime as needed for sleep. 30 tablet 0   No current facility-administered medications on file prior to visit.    Allergies  Allergen Reactions  . Mucinex [Guaifenesin Er] Hives, Itching and Swelling  . Penicillins Swelling    Did it involve swelling of the face/tongue/throat, SOB, or low BP? Yes Did it involve sudden or severe rash/hives, skin peeling, or any reaction on the inside of your mouth or nose? No Did you need to seek medical attention at a hospital or doctor's office? Yes When did it  last happen?>10 years ago If all above answers are "NO", may proceed with cephalosporin use.   . Contrast Media [Iodinated Diagnostic Agents] Nausea And Vomiting  . Multihance [Gadobenate] Nausea And Vomiting    Social History   Socioeconomic History  . Marital status: Single    Spouse name: Not on file  . Number of children: 0  . Years of education: college   . Highest education level: Not on file  Occupational History  . Occupation: Unemployed   Tobacco Use  . Smoking status: Never Smoker  . Smokeless tobacco: Never Used  Substance and Sexual Activity  . Alcohol use: No  . Drug use: No  . Sexual activity: Yes    Birth control/protection: None  Other Topics Concern  . Not on file  Social History Narrative   Lives with mom [name])    Right-handed   Caffeine: 3 cups of coffee per day  Social Determinants of Health   Financial Resource Strain:   . Difficulty of Paying Living Expenses:   Food Insecurity:   . Worried About Charity fundraiser in the Last Year:   . Arboriculturist in the Last Year:   Transportation Needs:   . Film/video editor (Medical):   Marland Kitchen Lack of Transportation (Non-Medical):   Physical Activity:   . Days of Exercise per Week:   . Minutes of Exercise per Session:   Stress:   . Feeling of Stress :   Social Connections:   . Frequency of Communication with Friends and Family:   . Frequency of Social Gatherings with Friends and Family:   . Attends Religious Services:   . Active Member of Clubs or Organizations:   . Attends Archivist Meetings:   Marland Kitchen Marital Status:   Intimate Partner Violence:   . Fear of Current or Ex-Partner:   . Emotionally Abused:   Marland Kitchen Physically Abused:   . Sexually Abused:     Family History  Problem Relation Age of Onset  . Hypertension Mother   . Arnold-Chiari malformation Mother   . Restless legs syndrome Mother   . Osteoporosis Mother   . COPD Mother   . Asthma Mother   . Squamous cell  carcinoma Mother        skin   . Eczema Mother   . Arthritis Mother   . Peripheral Artery Disease Mother   . Hypertension Father   . Scoliosis Father   . Bipolar disorder Father   . Congestive Heart Failure Father   . COPD Father   . Diabetes Maternal Grandmother   . Hypertension Maternal Grandmother   . Cancer Paternal Grandmother        colon  . Bone cancer Maternal Grandfather 82       bone marrow   . Multiple myeloma Maternal Grandfather   . Diabetes Paternal Grandfather     Past Surgical History:  Procedure Laterality Date  . CHOLECYSTECTOMY  2003   . OPEN REDUCTION INTERNAL FIXATION (ORIF) PROXIMAL PHALANX Right 07/23/2013   Procedure: OPEN TREATMENT RIGHT RING FINGER, PROXIMAL INTERPHALANGEAL/JOINT FRACTURE DISLOCATION;  Surgeon: Jolyn Nap, MD;  Location: [facility];  Service: Orthopedics;  Laterality: Right;  . TONSILLECTOMY      ROS: Review of Systems Negative except as stated above  PHYSICAL EXAM: BP 138/87   Pulse 68   Temp (!) 97.5 F (36.4 C)   Resp 16   Wt (!) 319 lb 9.6 oz (145 kg)   SpO2 98%   BMI 50.81 kg/m   Physical Exam  General appearance - alert, well appearing, obese young Caucasian female and in no distress Mental status -patient has flat affect.  She is withdrawn.  She has written down her symptoms on paper for me to review today. Chest - clear to auscultation, no wheezes, rales or rhonchi, symmetric air entry Heart - normal rate, regular rhythm, normal S1, S2, no murmurs, rubs, clicks or gallops   CMP Latest Ref Rng & Units 04/15/2019 04/15/2019 07/22/2017  Glucose 70 - 99 mg/dL - 91 91  BUN 6 - 20 mg/dL - 14 9  Creatinine 0.44 - 1.00 mg/dL - 0.86 0.92  Sodium 135 - 145 mmol/L - 139 141  Potassium 3.5 - 5.1 mmol/L - 3.9 4.2  Chloride 98 - 111 mmol/L - 107 102  CO2 22 - 32 mmol/L - 23 23  Calcium 8.9 - 10.3 mg/dL -  9.1 9.5  Total Protein 6.5 - 8.1 g/dL 7.0 6.7 6.4  Total Bilirubin 0.3 - 1.2 mg/dL 0.5 0.7 0.4    Alkaline Phos 38 - 126 U/L 111 112 118(H)  AST 15 - 41 U/L 20 20 16   ALT 0 - 44 U/L 22 22 21    Lipid Panel     Component Value Date/Time   CHOL 204 (H) 04/15/2019 0624   TRIG 146 04/15/2019 0624   HDL 41 04/15/2019 0624   CHOLHDL 5.0 04/15/2019 0624   VLDL 29 04/15/2019 0624   LDLCALC 134 (H) 04/15/2019 0624    CBC    Component Value Date/Time   WBC 10.0 07/22/2017 1339   WBC 10.2 01/08/2017 0950   RBC 4.72 07/22/2017 1339   RBC 5.22 (H) 01/08/2017 0950   HGB 13.7 07/22/2017 1339   HCT 41.3 07/22/2017 1339   PLT 267 07/22/2017 1339   MCV 88 07/22/2017 1339   MCH 29.0 07/22/2017 1339   MCH 29.2 09/26/2015 1804   MCHC 33.2 07/22/2017 1339   MCHC 33.2 01/08/2017 0950   RDW 13.9 07/22/2017 1339   LYMPHSABS 3.3 (H) 07/22/2017 1339   MONOABS 0.5 01/08/2017 0950   EOSABS 0.5 (H) 07/22/2017 1339   BASOSABS 0.0 07/22/2017 1339   Depression screen [facility] 2/9 04/23/2019 03/12/2019 03/10/2019  Decreased Interest 3 3 3   Down, Depressed, Hopeless 3 3 3   PHQ - 2 Score 6 6 6   Altered sleeping 3 0 0  Tired, decreased energy 3 3 3   Change in appetite 0 0 0  Feeling bad or failure about yourself  3 3 3   Trouble concentrating 0 3 3  Moving slowly or fidgety/restless 3 3 3   Suicidal thoughts 1 3 3   PHQ-9 Score 19 21 21   Some recent data might be hidden   GAD 7 : Generalized Anxiety Score 04/23/2019 03/12/2019 03/10/2019 01/22/2017  Nervous, Anxious, on Edge 3 3 3 1   Control/stop worrying 3 3 3  0  Worry too much - different things 3 3 3 1   Trouble relaxing 3 3 1  0  Restless 3 0 0 0  Easily annoyed or irritable 3 3 2 2   Afraid - awful might happen 3 3 3  0  Total GAD 7 Score 21 18 15 4      ASSESSMENT AND PLAN:  1. Anxiety and depression -Patient's symptoms are not adequately controlled. She will continue the current medications that she was discharged on from the hospital. Advised to contact Dr. Marguerite Olea office to schedule follow-up appointment as soon as possible. I will complete  her reasonable accommodation form for work  2. Irritable bowel syndrome with diarrhea -Advised patient to try to avoid foods that make her symptoms worse.  Given a list of FODMAPS type foods to avoid -Advised to take Imodium twice a day as needed before the 2 largest meals of the day.  If symptoms persist we can try her with a bile sequestering agent like colestipol   3. Abnormal serum thyroid stimulating hormone (TSH) level TSH slightly elevated.  We will plan to recheck levels in about 3 months.  We usually do not initiate treatment if TSH is less than 10   Patient was given the opportunity to ask questions.  Patient verbalized understanding of the plan and was able to repeat key elements of the plan.   No orders of the defined types were placed in this encounter.    Requested Prescriptions    No prescriptions requested or ordered in this encounter  Return in about 2 months (around 06/23/2019).  Karle Plumber, MD, FACP

## 2019-04-23 NOTE — Patient Instructions (Addendum)
I will complete the reasonable accommodation form that you brought in today. Please touch base with your psychiatrist to schedule a follow-up visit.  We will plan to recheck your thyroid level in several weeks.  Try taking Imodium twice a day as needed before the 2 largest meals of the day.

## 2019-04-28 ENCOUNTER — Telehealth: Payer: Self-pay

## 2019-04-28 NOTE — Telephone Encounter (Signed)
Contacted pt and left a detailed vm informing pt that her accommodation paperwork has been faxed and she can pick up original up front

## 2019-05-03 ENCOUNTER — Encounter (HOSPITAL_COMMUNITY): Payer: Self-pay

## 2019-05-03 DIAGNOSIS — F332 Major depressive disorder, recurrent severe without psychotic features: Secondary | ICD-10-CM

## 2019-05-03 NOTE — BH Assessment (Signed)
Q-Actual   Writer left voice mail message on 04-30-2019.

## 2019-05-05 ENCOUNTER — Encounter (HOSPITAL_COMMUNITY): Payer: Self-pay | Admitting: Psychiatry

## 2019-05-05 ENCOUNTER — Telehealth (HOSPITAL_COMMUNITY): Payer: Self-pay | Admitting: Professional

## 2019-05-05 ENCOUNTER — Ambulatory Visit (INDEPENDENT_AMBULATORY_CARE_PROVIDER_SITE_OTHER): Payer: No Typology Code available for payment source | Admitting: Psychiatry

## 2019-05-05 ENCOUNTER — Other Ambulatory Visit: Payer: Self-pay

## 2019-05-05 VITALS — Wt 319.0 lb

## 2019-05-05 DIAGNOSIS — F4010 Social phobia, unspecified: Secondary | ICD-10-CM

## 2019-05-05 DIAGNOSIS — F332 Major depressive disorder, recurrent severe without psychotic features: Secondary | ICD-10-CM

## 2019-05-05 DIAGNOSIS — F339 Major depressive disorder, recurrent, unspecified: Secondary | ICD-10-CM

## 2019-05-05 DIAGNOSIS — R480 Dyslexia and alexia: Secondary | ICD-10-CM | POA: Diagnosis not present

## 2019-05-05 MED ORDER — SERTRALINE HCL 50 MG PO TABS: 50.0000 mg | ORAL_TABLET | Freq: Every day | ORAL | 0 refills | Status: DC

## 2019-05-05 MED FILL — SERTRALINE HCL 50 MG TABLET: 50 | 30 days supply | Qty: 30 | Fill #0

## 2019-05-05 NOTE — Progress Notes (Signed)
Virtual Visit via Telephone Note  I connected with Kristen [name] on 05/05/19 at 11:00 AM EDT by telephone and verified that I am speaking with the correct person using two identifiers.   I discussed the limitations, risks, security and privacy concerns of performing an evaluation and management service by telephone and the availability of in person appointments. I also discussed with the patient that there may be a patient responsible charge related to this service. The patient expressed understanding and agreed to proceed.   History of Present Illness: Kristen [name] is 34 year old Caucasian single employed female who was seen on March 5 as initial evaluation.  [facility] after having suicidal thoughts and her mother did not feel safe as patient was thoughts of jumping from the car or crashing her car.  Patient has lot of stress from work, taking care of handicapped parents.  She reported having a nervous breakdown at work while she was in bathroom.  We have recommended FMLA but patient did not have paperwork and went back to work and could not handle the stress.  In the hospital her Prozac was discontinued and she was started on hydroxyzine, BuSpar, trazodone and Zoloft.  In the hospital she was somewhat better but since she discharged from the hospital she started to have again increased anxiety, having crying spells, feeling of hopelessness and fleeting suicidal thoughts but no plan or any intent.  Patient told recently her mother fell and may require neck surgery.  Her father is dependent on other people.  She is very disturbed because her car needs repair.  She has to divide her time to look after for her father and then also she states some days with her fianc.  During the session she was having crying spells, a lot of ruminative and negative thoughts denies any hallucination, paranoia.  She sleeps on and off.  She complains of fatigue, lack of energy  and desire to do anything.  She only had 1 week off and she is scheduled to go back to work on April 4.  She is afraid that she is not able to go back to work because there is so many things going on and she feeling overwhelmed.  She also have multiple health issues including asthma, PCO, scoliosis, apnea, migraine.  She takes propranolol to help her blood pressure.  She reported trazodone helps her sleep and she has taken few times hydroxyzine to help her anxiety.  She also taking BuSpar and Zoloft but not sure about response since she does started few days ago.  So far she has no tremors or any side effects of medication.  She endorsed decreased attention, concentration and sometimes forgetful.  She reported her anxiety is overwhelming.  She denies any anger, mood swing or any mania.  She had support from her fianc.  Past Psychiatric History: H/O social anxiety.  Started school late.  Had dyslexia and difficulty reading and math.  Her grades were below average.  She never had psychological testing but there was a suspicion of autism but never tested.  She remember on the beginning socially awkward and does not have friends.  History of taking Zoloft for [facility].  No history of suicidal attempt but endorsed history of suicidal thoughts.  History of severe anxiety around people.  Recent Results (from the past 2160 hour(s))  Cytology - PAP( [facility])     Status: None   Collection Time: 03/10/19  3:25 PM  Result Value Ref Range   High risk HPV Negative    Adequacy      Satisfactory for evaluation; transformation zone component PRESENT.   Diagnosis      - Negative for intraepithelial lesion or malignancy (NILM)   Comment Normal Reference Range HPV - Negative   Respiratory Panel by RT PCR (Flu A&B, Covid) - Nasopharyngeal Swab     Status: None   Collection Time: 04/14/19  9:47 PM   Specimen: Nasopharyngeal Swab  Result Value Ref Range   SARS Coronavirus 2 by RT PCR NEGATIVE NEGATIVE     Comment: (NOTE) SARS-CoV-2 target nucleic acids are NOT DETECTED. The SARS-CoV-2 RNA is generally detectable in upper respiratoy specimens during the acute phase of infection. The lowest concentration of SARS-CoV-2 viral copies this assay can detect is 131 copies/mL. A negative result does not preclude SARS-Cov-2 infection and should not be used as the sole basis for treatment or other patient management decisions. A negative result may occur with  improper specimen collection/handling, submission of specimen other than nasopharyngeal swab, presence of viral mutation(s) within the areas targeted by this assay, and inadequate number of viral copies (<131 copies/mL). A negative result must be combined with clinical observations, patient history, and epidemiological information. The expected result is Negative. Fact Sheet for Patients:  https://www.moore.com/ Fact Sheet for Healthcare Providers:  https://www.young.biz/ This test is not yet ap proved or cleared by the [place] FDA and  has been authorized for detection and/or diagnosis of SARS-CoV-2 by FDA under an Emergency Use Authorization (EUA). This EUA will remain  in effect (meaning this test can be used) for the duration of the COVID-19 declaration under Section 564(b)(1) of the Act, 21 U.S.C. section 360bbb-3(b)(1), unless the authorization is terminated or revoked sooner.    Influenza A by PCR NEGATIVE NEGATIVE   Influenza B by PCR NEGATIVE NEGATIVE    Comment: (NOTE) The Xpert Xpress SARS-CoV-2/FLU/RSV assay is intended as an aid in  the diagnosis of influenza from Nasopharyngeal swab specimens and  should not be used as a sole basis for treatment. Nasal washings and  aspirates are unacceptable for Xpert Xpress SARS-CoV-2/FLU/RSV  testing. Fact Sheet for Patients: https://www.moore.com/ Fact Sheet for Healthcare  Providers: https://www.young.biz/ This test is not yet approved or cleared by the [place] FDA and  has been authorized for detection and/or diagnosis of SARS-CoV-2 by  FDA under an Emergency Use Authorization (EUA). This EUA will remain  in effect (meaning this test can be used) for the duration of the  Covid-19 declaration under Section 564(b)(1) of the Act, 21  U.S.C. section 360bbb-3(b)(1), unless the authorization is  terminated or revoked. Performed at [facility], [address] [place], [place] [address]   Urinalysis, Complete w Microscopic     Status: Abnormal   Collection Time: 04/15/19  1:30 AM  Result Value Ref Range   Color, Urine YELLOW YELLOW   APPearance TURBID (A) CLEAR   Specific Gravity, Urine 1.026 1.005 - 1.030   pH 6.0 5.0 - 8.0   Glucose, UA NEGATIVE NEGATIVE mg/dL   Hgb urine dipstick NEGATIVE NEGATIVE   Bilirubin Urine NEGATIVE NEGATIVE   Ketones, ur NEGATIVE NEGATIVE mg/dL   Protein, ur NEGATIVE NEGATIVE mg/dL   Nitrite NEGATIVE NEGATIVE   Leukocytes,Ua NEGATIVE NEGATIVE   RBC / HPF 0-5 0 - 5 RBC/hpf   WBC, UA 0-5 0 - 5 WBC/hpf   Bacteria, UA RARE (A) NONE SEEN   Squamous Epithelial /  LPF 0-5 0 - 5   Mucus PRESENT     Comment: Performed at [facility], [place] [address] [place], [place] [address]  Urine rapid drug screen (hosp performed)not at [facility]     Status: None   Collection Time: 04/15/19  1:30 AM  Result Value Ref Range   Opiates NONE DETECTED NONE DETECTED   Cocaine NONE DETECTED NONE DETECTED   Benzodiazepines NONE DETECTED NONE DETECTED   Amphetamines NONE DETECTED NONE DETECTED   Tetrahydrocannabinol NONE DETECTED NONE DETECTED   Barbiturates NONE DETECTED NONE DETECTED    Comment: (NOTE) DRUG SCREEN FOR MEDICAL PURPOSES ONLY.  IF CONFIRMATION IS NEEDED FOR ANY PURPOSE, NOTIFY LAB WITHIN 5 DAYS. LOWEST DETECTABLE LIMITS FOR URINE DRUG SCREEN Drug Class                      Cutoff (ng/mL) Amphetamine and metabolites    1000 Barbiturate and metabolites    200 Benzodiazepine                 604 Tricyclics and metabolites     300 Opiates and metabolites        300 Cocaine and metabolites        300 THC                            50 Performed at [facility], [place] [address] [place], [place] [address]   Comprehensive metabolic panel     Status: None   Collection Time: 04/15/19  6:24 AM  Result Value Ref Range   Sodium 139 135 - 145 mmol/L   Potassium 3.9 3.5 - 5.1 mmol/L   Chloride 107 98 - 111 mmol/L   CO2 23 22 - 32 mmol/L   Glucose, Bld 91 70 - 99 mg/dL    Comment: Glucose reference range applies only to samples taken after fasting for at least 8 hours.   BUN 14 6 - 20 mg/dL   Creatinine, Ser 0.86 0.44 - 1.00 mg/dL   Calcium 9.1 8.9 - 10.3 mg/dL   Total Protein 6.7 6.5 - 8.1 g/dL   Albumin 3.7 3.5 - 5.0 g/dL   AST 20 15 - 41 U/L   ALT 22 0 - 44 U/L   Alkaline Phosphatase 112 38 - 126 U/L   Total Bilirubin 0.7 0.3 - 1.2 mg/dL   GFR calc non Af Amer >60 >60 mL/min   GFR calc Af Amer >60 >60 mL/min   Anion gap 9 5 - 15    Comment: Performed at [facility], [place] [address], [place], [place] [address]  Hemoglobin A1c     Status: None   Collection Time: 04/15/19  6:24 AM  Result Value Ref Range   Hgb A1c MFr Bld 4.9 4.8 - 5.6 %    Comment: (NOTE) Pre diabetes:          5.7%-6.4% Diabetes:              >6.4% Glycemic control for   <7.0% adults with diabetes    Mean Plasma Glucose 93.93 mg/dL    Comment: Performed at [place] [address], [place], [place] [address]  Magnesium     Status: None   Collection Time: 04/15/19  6:24 AM  Result Value Ref Range   Magnesium 2.1 1.7 - 2.4 mg/dL    Comment: Performed at [facility], [address] [place], [place] [address]  Ethanol     Status: None   Collection Time: 04/15/19  6:24 AM  Result Value Ref Range   Alcohol, Ethyl (B)  <10 <10 mg/dL    Comment: (NOTE) Lowest detectable limit for serum alcohol is 10 mg/dL. For medical purposes only. Performed at [facility], [address], [place], [place] [address]   Lipid panel     Status: Abnormal   Collection Time: 04/15/19  6:24 AM  Result Value Ref Range   Cholesterol 204 (H) 0 - 200 mg/dL   Triglycerides 102 <585 mg/dL   HDL 41 >27 mg/dL   Total CHOL/HDL Ratio 5.0 RATIO   VLDL 29 0 - 40 mg/dL   LDL Cholesterol 782 (H) 0 - 99 mg/dL    Comment:        Total Cholesterol/HDL:CHD Risk Coronary Heart Disease Risk Table                     Men   Women  1/2 Average Risk   3.4   3.3  Average Risk       5.0   4.4  2 X Average Risk   9.6   7.1  3 X Average Risk  23.4   11.0        Use the calculated Patient Ratio above and the CHD Risk Table to determine the patient's CHD Risk.        ATP III CLASSIFICATION (LDL):  <100     mg/dL   Optimal  423-536  mg/dL   Near or Above                    Optimal  130-159  mg/dL   Borderline  144-315  mg/dL   High  >400     mg/dL   Very High Performed at [facility], [address] [place], [place] [address]   Hepatic function panel     Status: None   Collection Time: 04/15/19  6:24 AM  Result Value Ref Range   Total Protein 7.0 6.5 - 8.1 g/dL   Albumin 3.8 3.5 - 5.0 g/dL   AST 20 15 - 41 U/L   ALT 22 0 - 44 U/L   Alkaline Phosphatase 111 38 - 126 U/L   Total Bilirubin 0.5 0.3 - 1.2 mg/dL   Bilirubin, Direct 0.1 0.0 - 0.2 mg/dL   Indirect Bilirubin 0.4 0.3 - 0.9 mg/dL    Comment: Performed at [facility], [address], [place], [place] [address]  TSH     Status: Abnormal   Collection Time: 04/15/19  6:24 AM  Result Value Ref Range   TSH 4.628 (H) 0.350 - 4.500 uIU/mL    Comment: Performed by a 3rd Generation assay with a functional sensitivity of <=0.01 uIU/mL. Performed at [facility], [address] [place], [place] [address]        Psychiatric Specialty Exam: Physical Exam  Review of Systems  Weight (!) 319 lb (144.7 kg).Body mass index is 50.72 kg/m.  General Appearance: NA  Eye Contact:  NA  Speech:  Slow  Volume:  Decreased  Mood:  Anxious, Depressed, Hopeless and tearful  Affect:  NA  Thought Process:  Descriptions of Associations: Intact  Orientation:  Full (Time, Place, and Person)  Thought Content:  Rumination  Suicidal Thoughts:  passive and fleetig thoughts but no plan or intent  Homicidal Thoughts:  No  Memory:  Immediate;   Good Recent;   Good Remote;   Fair  Judgement:  Intact  Insight:  Present  Psychomotor Activity:  Decreased  Concentration:  Concentration: Fair and Attention Span: Fair  Recall:  [employer] of Knowledge:  Good  Language:  Good  Akathisia:  No  Handed:  Right  AIMS (if indicated):     Assets:  Communication Skills Desire for Improvement Housing Transportation  ADL's:  Intact  Cognition:  WNL  Sleep:   7 hrs       Assessment and Plan: Major depressive disorder, recurrent.  Social anxiety disorder.  Dyslexia  I reviewed discharge summary, current medication, blood work results and psychosocial stressors.  So far patient is tolerating her medication.  I recommend to try Zoloft 50 mg daily and try to take hydroxyzine at bedtime since it does help anxiety and sleep.  If she still have trouble sleeping then she can take trazodone.  I recommend to give more time to BuSpar since she is taking 10 mg twice a day and started recently.  We also discussed about PHP as patient continued to have a lot of anxiety, passive and fleeting suicidal thoughts.  She does not want to go back to inpatient but willing to give a try to PHP.  She agreed with the plan.  She will be out of work on [employer] until her next appointment.  We will contact PHP coordinator to schedule starting day.  Discussed safety concerns and anytime having active suicidal thoughts or homicidal thought then she need  to call 911 or go to local emergency room.  Follow-up upon completion of PHP.    Follow Up Instructions:    I discussed the assessment and treatment plan with the patient. The patient was provided an opportunity to ask questions and all were answered. The patient agreed with the plan and demonstrated an understanding of the instructions.   The patient was advised to call back or seek an in-person evaluation if the symptoms worsen or if the condition fails to improve as anticipated.  I provided 30 minutes of non-face-to-face time during this encounter.   Cleotis Nipper, MD

## 2019-05-06 ENCOUNTER — Other Ambulatory Visit: Payer: Self-pay

## 2019-05-06 ENCOUNTER — Other Ambulatory Visit (HOSPITAL_COMMUNITY)
Payer: No Typology Code available for payment source | Attending: Psychiatry | Admitting: Licensed Clinical Social Worker

## 2019-05-06 ENCOUNTER — Encounter (HOSPITAL_COMMUNITY): Payer: Self-pay

## 2019-05-06 DIAGNOSIS — F329 Major depressive disorder, single episode, unspecified: Secondary | ICD-10-CM | POA: Diagnosis not present

## 2019-05-06 DIAGNOSIS — F419 Anxiety disorder, unspecified: Secondary | ICD-10-CM | POA: Diagnosis present

## 2019-05-06 DIAGNOSIS — J45909 Unspecified asthma, uncomplicated: Secondary | ICD-10-CM | POA: Insufficient documentation

## 2019-05-06 DIAGNOSIS — F332 Major depressive disorder, recurrent severe without psychotic features: Secondary | ICD-10-CM

## 2019-05-06 DIAGNOSIS — I10 Essential (primary) hypertension: Secondary | ICD-10-CM | POA: Insufficient documentation

## 2019-05-06 DIAGNOSIS — E282 Polycystic ovarian syndrome: Secondary | ICD-10-CM | POA: Insufficient documentation

## 2019-05-10 ENCOUNTER — Other Ambulatory Visit (HOSPITAL_COMMUNITY): Payer: No Typology Code available for payment source | Admitting: Licensed Clinical Social Worker

## 2019-05-10 ENCOUNTER — Encounter (HOSPITAL_COMMUNITY): Payer: Self-pay

## 2019-05-10 ENCOUNTER — Other Ambulatory Visit: Payer: Self-pay

## 2019-05-10 DIAGNOSIS — F419 Anxiety disorder, unspecified: Secondary | ICD-10-CM | POA: Diagnosis not present

## 2019-05-10 DIAGNOSIS — F332 Major depressive disorder, recurrent severe without psychotic features: Secondary | ICD-10-CM
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
## 2019-05-10 NOTE — Progress Notes (Deleted)
PATIENT: Kristen [name] DOB: [birth year]  REASON FOR VISIT: follow up HISTORY FROM: patient  No chief complaint on file.    HISTORY OF PRESENT ILLNESS: Today 05/10/19 Kristen [name] is a 34 y.o. female here today for follow up for migraines. She was recently hospitalized for worsening depression and anxiety with suicidal ideations. She is now taking Zoloft and trazodone. She is seen by psychiatry and psychology.    HISTORY: (copied from my note on 03/17/2019)  Kristen [name] is a 34 y.o. female here today for follow up for migraines. We started propranolol 63m twice daily in 09/2018. She does wake up occasionally with headaches but most occur at night. She has at least three migrainous headaches weekly. Usually left sided retro orbital pressure. She is sensitive to light and sound, occasionally nauseated. She has noted "free floating colors" before headache start. She feels disoriented and has brain fog with migraine. She has seen PCP recently who was concerned of previous MRI results. 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 Repeat MRI in 10/2016 was stable.   She reports using CPAP on most nights. She usually gets 4 hours each night. She usually goes to bed around 11:30 and wakes around 6am. She always feels tired. She is working full time. She feels that if she is not at work she needs to nap. She has brain fog all the time.  Compliance report dated 02/14/2019 through 03/15/2019 reveals that she used CPAP 24 of the last 30 days for compliance of 80%.  She is CPAP greater than 4 hours 24 out of the last 30 days for compliance of 80%.  Average usage was 7 hours and 47 minutes.  Residual AHI was 0.8 on 4 to 10 cm of pressure and an EPR of 3.  There was no significant leak noted.  She is having irregular menstrual cycles. She was diagnosed with PCOS in 2018. She was started on metformin in 2018 and stopped when she lost insurance. She recently restarted on metformin  [id number] but had nausea and diarrhea for about 1-2 months. She has not had a menstrual cycle since 12/2018. She was seen last week by OB/GYN and started on Femara/Provera to stimulate cycle. She is hoping to conceive. She was also given letrozole to induce ovulation.   She does admit that she has significant anxiety and depression. [facility] a [facility]. When she was younger she had multiple attempts of suicide. Never hospitalized. No attempts since teenage years. She does still have thoughts of committing suicide.  She does not have a plan.  She often thinks of driving off a bridge. She lives at home but spends a lot of time with her fiance. Both parents are disabled. She feels that she has been a primary caregiver to her parents since early childhood. Dad has bipolar disorder and paranoid schizophrenia. Mom has Chiari malformation and fibromyalgia as well as anxiety and depression. She "buries her feelings" but will reach out to her fiance at times.    REVIEW OF SYSTEMS: Out of a complete 14 system review of symptoms, the patient complains only of the following symptoms, and all other reviewed systems are negative.  ALLERGIES: Allergies  Allergen Reactions  . Mucinex [Guaifenesin Er] Hives, Itching and Swelling  . Penicillins Swelling    Did it involve swelling of the face/tongue/throat, SOB, or low BP? Yes Did it involve sudden or severe rash/hives, skin  peeling, or any reaction on the inside of your mouth or nose? No Did you need to seek medical attention at a hospital or doctor's office? Yes When did it last happen?>10 years ago If all above answers are "NO", may proceed with cephalosporin use.   . Contrast Media [Iodinated Diagnostic Agents] Nausea And Vomiting  . Multihance [Gadobenate] Nausea And Vomiting    HOME MEDICATIONS: Outpatient Medications Prior to Visit  Medication Sig Dispense Refill  . albuterol  (VENTOLIN HFA) 108 (90 Base) MCG/ACT inhaler Inhale 2 puffs into the lungs every 6 (six) hours as needed for wheezing or shortness of breath. 6.7 g 3  . busPIRone (BUSPAR) 10 MG tablet Take 1 tablet (10 mg total) by mouth 2 (two) times daily. For anxiety 60 tablet 0  . hydrOXYzine (ATARAX/VISTARIL) 25 MG tablet Take 1 tablet (25 mg total) by mouth 3 (three) times daily as needed for anxiety. 75 tablet 0  . naproxen (NAPROSYN) 500 MG tablet Take 1 tablet (500 mg total) by mouth 2 (two) times daily with a meal. (May buy from over the counter): For pain    . propranolol (INDERAL) 20 MG tablet Take 1 tablet (20 mg total) by mouth 2 (two) times daily. For anxiety 60 tablet 0  . sertraline (ZOLOFT) 50 MG tablet Take 1 tablet (50 mg total) by mouth daily. For depression 30 tablet 0  . traZODone (DESYREL) 50 MG tablet Take 1 tablet (50 mg total) by mouth at bedtime as needed for sleep. 30 tablet 0   No facility-administered medications prior to visit.    PAST MEDICAL HISTORY: Past Medical History:  Diagnosis Date  . Anxiety 2013   treated at [facility] by Pauline Good   . Asthma 1989    no hospitalization, or intubation, previously on advair and singulair. asthma worsening.   . 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 Depression 2001   depressed since childhood   . 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 Essential hypertension 03/12/2019  . Kyphosis of thoracic region 2004    painful, runs in dad side   . Migraine   . Pancreatitis 2003   gallstone pancreatitis   . Pancreatitis due to biliary obstruction 2003  . PCOS (polycystic ovarian syndrome) 2014   facial hair, irregular periods, never been pregnant     PAST SURGICAL HISTORY: Past Surgical History:  Procedure Laterality Date  . CHOLECYSTECTOMY  2003   . OPEN REDUCTION INTERNAL FIXATION (ORIF) PROXIMAL PHALANX Right 07/23/2013   Procedure: OPEN TREATMENT RIGHT RING FINGER, PROXIMAL INTERPHALANGEAL/JOINT FRACTURE DISLOCATION;   Surgeon: Jolyn Nap, MD;  Location: [facility];  Service: Orthopedics;  Laterality: Right;  . TONSILLECTOMY      FAMILY HISTORY: Family History  Problem Relation Age of Onset  . Hypertension Mother   . Arnold-Chiari malformation Mother   . Restless legs syndrome Mother   . Osteoporosis Mother   . COPD Mother   . Asthma Mother   . Squamous cell carcinoma Mother        skin   . Eczema Mother   . Arthritis Mother   . Peripheral Artery Disease Mother   . Anxiety disorder Mother   . OCD Mother   . Hypertension Father   . Scoliosis Father   . Bipolar disorder Father   . Congestive Heart Failure Father   . COPD Father   . Depression Father   . Diabetes Maternal Grandmother   .  Hypertension Maternal Grandmother   . Dementia Maternal Grandmother   . OCD Maternal Grandmother   . Cancer Paternal Grandmother        colon  . Bone cancer Maternal Grandfather 82       bone marrow   . Multiple myeloma Maternal Grandfather   . Alcohol abuse Maternal Grandfather   . Drug abuse Maternal Grandfather   . Diabetes Paternal Grandfather   . Alcohol abuse Paternal Grandfather   . Drug abuse Paternal Grandfather   . Dementia Paternal Grandfather   . Alcohol abuse Maternal Aunt   . Depression Maternal Aunt   . Alcohol abuse Paternal Aunt   . Depression Paternal Aunt   . ADD / ADHD Cousin     SOCIAL HISTORY: Social History   Socioeconomic History  . Marital status: Single    Spouse name: Not on file  . Number of children: 0  . Years of education: college   . Highest education level: Not on file  Occupational History  . Occupation: Unemployed   Tobacco Use  . Smoking status: Never Smoker  . Smokeless tobacco: Never Used  Substance and Sexual Activity  . Alcohol use: No  . Drug use: No  . Sexual activity: Yes    Birth control/protection: None  Other Topics Concern  . Not on file  Social History Narrative   Lives with mom [name])    Right-handed    Caffeine: 3 cups of coffee per day   Social Determinants of Health   Financial Resource Strain:   . Difficulty of Paying Living Expenses:   Food Insecurity:   . Worried About Charity fundraiser in the Last Year:   . Arboriculturist in the Last Year:   Transportation Needs:   . Film/video editor (Medical):   Marland Kitchen Lack of Transportation (Non-Medical):   Physical Activity:   . Days of Exercise per Week:   . Minutes of Exercise per Session:   Stress:   . Feeling of Stress :   Social Connections:   . Frequency of Communication with Friends and Family:   . Frequency of Social Gatherings with Friends and Family:   . Attends Religious Services:   . Active Member of Clubs or Organizations:   . Attends Archivist Meetings:   Marland Kitchen Marital Status:   Intimate Partner Violence:   . Fear of Current or Ex-Partner:   . Emotionally Abused:   Marland Kitchen Physically Abused:   . Sexually Abused:       PHYSICAL EXAM  There were no vitals filed for this visit. There is no height or weight on file to calculate BMI.  Generalized: Well developed, in no acute distress  Cardiology: normal rate and rhythm, no murmur noted Neurological examination  Mentation: Alert oriented to time, place, history taking. Follows all commands speech and language fluent Cranial nerve II-XII: Pupils were equal round reactive to light. Extraocular movements were full, visual field were full on confrontational test. Facial sensation and strength were normal. Uvula tongue midline. Head turning and shoulder shrug  were normal and symmetric. Motor: The motor testing reveals 5 over 5 strength of all 4 extremities. Good symmetric motor tone is noted throughout.  Sensory: Sensory testing is intact to soft touch on all 4 extremities. No evidence of extinction is noted.  Coordination: Cerebellar testing reveals good finger-nose-finger and heel-to-shin bilaterally.  Gait and station: Gait is normal. Tandem gait is normal. Romberg  is negative. No drift is seen.  Reflexes: Deep tendon reflexes are symmetric and normal bilaterally.   DIAGNOSTIC DATA (LABS, IMAGING, TESTING) - I reviewed patient records, labs, notes, testing and imaging myself where available.  No flowsheet data found.   Lab Results  Component Value Date   WBC 10.0 07/22/2017   HGB 13.7 07/22/2017   HCT 41.3 07/22/2017   MCV 88 07/22/2017   PLT 267 07/22/2017      Component Value Date/Time   NA 139 04/15/2019 0624   NA 141 07/22/2017 1339   K 3.9 04/15/2019 0624   CL 107 04/15/2019 0624   CO2 23 04/15/2019 0624   GLUCOSE 91 04/15/2019 0624   BUN 14 04/15/2019 0624   BUN 9 07/22/2017 1339   CREATININE 0.86 04/15/2019 0624   CREATININE 1.09 03/25/2014 1730   CALCIUM 9.1 04/15/2019 0624   PROT 6.7 04/15/2019 0624   PROT 7.0 04/15/2019 0624   PROT 6.4 07/22/2017 1339   ALBUMIN 3.7 04/15/2019 0624   ALBUMIN 3.8 04/15/2019 0624   ALBUMIN 4.2 07/22/2017 1339   AST 20 04/15/2019 0624   AST 20 04/15/2019 0624   ALT 22 04/15/2019 0624   ALT 22 04/15/2019 0624   ALKPHOS 112 04/15/2019 0624   ALKPHOS 111 04/15/2019 0624   BILITOT 0.7 04/15/2019 0624   BILITOT 0.5 04/15/2019 0624   BILITOT 0.4 07/22/2017 1339   GFRNONAA >60 04/15/2019 0624   GFRNONAA 70 03/25/2014 1730   GFRAA >60 04/15/2019 0624   GFRAA 80 03/25/2014 1730   Lab Results  Component Value Date   CHOL 204 (H) 04/15/2019   HDL 41 04/15/2019   LDLCALC 134 (H) 04/15/2019   TRIG 146 04/15/2019   CHOLHDL 5.0 04/15/2019   Lab Results  Component Value Date   HGBA1C 4.9 04/15/2019   Lab Results  Component Value Date   VITAMINB12 346 07/22/2017   Lab Results  Component Value Date   TSH 4.628 (H) 04/15/2019       ASSESSMENT AND PLAN 34 y.o. year old female  has a past medical history of Anxiety (2013), Asthma (1989 ), Congenital third kidney (1989 ), Depression (2001), Dysrhythmia (2010), Essential hypertension (03/12/2019), Kyphosis of thoracic region (2004 ),  Migraine, Pancreatitis (2003), Pancreatitis due to biliary obstruction (2003), and PCOS (polycystic ovarian syndrome) (2014). here with ***  No diagnosis found.     No orders of the defined types were placed in this encounter.    No orders of the defined types were placed in this encounter.     I spent 15 minutes with the patient. 50% of this time was spent counseling and educating patient on plan of care and medications.    Debbora Presto, FNP-C 05/10/2019, 7:42 PM [facility] [address] [place], [place] [address] [phone]

## 2019-05-10 NOTE — Progress Notes (Signed)
Spoke with patient via Webex video call, used 2 identifiers to correctly identify patient. This is her first time in [facility], she was referred by Dr. [name] Mustache after an inpatient stay at [facility] for suicidal thoughts. She has passive SI with no plan or intent. Has a lot of stress at home and work life, taking care of her elderly parents, has several medical problems herself, and gets overwhelmed by being a people pleaser. She believes she may have high functioning autisim because she has sensory overload where she gets diarrhea and nausea and needs to sit in the bathroom when at work to calm down. On scale 1-10 as 10 being worst she rates depression at 8 and anxiety at 10. Denies SI/HI or AV hallucinations. PHQ9=22. Today is her first day in group. No issues or complaints. No side effects with medication.

## 2019-05-10 NOTE — Psych (Signed)
Virtual Visit via Video Note  I connected with Kristen [name] on 05/10/19 at  9:00 AM EDT by a video enabled telemedicine application and verified that I am speaking with the correct person using two identifiers.   I discussed the limitations of evaluation and management by telemedicine and the availability of in person appointments. The patient expressed understanding and agreed to proceed.  Follow Up Instructions:    I discussed the assessment and treatment plan with the patient. The patient was provided an opportunity to ask questions and all were answered. The patient agreed with the plan and demonstrated an understanding of the instructions.   The patient was advised to call back or seek an in-person evaluation if the symptoms worsen or if the condition fails to improve as anticipated.  I provided 10 minutes of non-face-to-face time during this encounter.  Patient virtually agrees to treatment plan.  Quinn Axe, [facility]

## 2019-05-11 ENCOUNTER — Ambulatory Visit: Payer: No Typology Code available for payment source | Admitting: Family Medicine

## 2019-05-11 ENCOUNTER — Ambulatory Visit (HOSPITAL_COMMUNITY): Payer: No Typology Code available for payment source | Admitting: Psychology

## 2019-05-11 ENCOUNTER — Other Ambulatory Visit (HOSPITAL_COMMUNITY): Payer: No Typology Code available for payment source | Admitting: Specialist

## 2019-05-11 ENCOUNTER — Encounter (HOSPITAL_COMMUNITY): Payer: Self-pay

## 2019-05-11 ENCOUNTER — Encounter: Payer: Self-pay | Admitting: Family Medicine

## 2019-05-11 ENCOUNTER — Other Ambulatory Visit (HOSPITAL_COMMUNITY): Payer: No Typology Code available for payment source | Admitting: Licensed Clinical Social Worker

## 2019-05-11 ENCOUNTER — Other Ambulatory Visit: Payer: Self-pay

## 2019-05-11 DIAGNOSIS — R4589 Other symptoms and signs involving emotional state: Secondary | ICD-10-CM

## 2019-05-11 DIAGNOSIS — F332 Major depressive disorder, recurrent severe without psychotic features: Secondary | ICD-10-CM

## 2019-05-11 DIAGNOSIS — F419 Anxiety disorder, unspecified: Secondary | ICD-10-CM | POA: Diagnosis not present

## 2019-05-11 DIAGNOSIS — F4010 Social phobia, unspecified: Secondary | ICD-10-CM

## 2019-05-11 DIAGNOSIS — R41844 Frontal lobe and executive function deficit: Secondary | ICD-10-CM

## 2019-05-11 NOTE — BH Assessment (Signed)
Q-Actual   Writer left a message in order to complete a  PHQ 2-9 for a two week follow up.
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
## 2019-05-11 NOTE — Therapy (Signed)
Virtual Visit via Video Note  I connected with Kristen [name] on 05/11/19 at  11:00 AM EDT by a video enabled telemedicine application and verified that I am speaking with the correct person using two identifiers.   I discussed the limitations of evaluation and management by telemedicine and the availability of in person appointments. The patient expressed understanding and agreed to proceed.      I discussed the assessment and treatment plan with the patient. The patient was provided an opportunity to ask questions and all were answered. The patient agreed with the plan and demonstrated an understanding of the instructions.   The patient was advised to call back or seek an in-person evaluation if the symptoms worsen or if the condition fails to improve as anticipated.  I provided 90 minutes of non-face-to-face time during this encounter.     [facility] [place], [place], [address] Phone: [phone]   Fax:  [phone]  Occupational Therapy Evaluation  Patient Details  Name: Kristen [name] MRN: [id number] Date of Birth: [birth year] Referring Provider (OT): Ricky Ala   Encounter Date: 05/11/2019  OT End of Session - 05/11/19 1509    Visit Number  1    Number of Visits  8    Date for OT Re-Evaluation  06/08/19    Progress Note Due on Visit  10    OT Start Time  1100    OT Stop Time  1230    OT Time Calculation (min)  90 min    Activity Tolerance  Patient tolerated treatment well    Behavior During Therapy  [facility] for tasks assessed/performed       Past Medical History:  Diagnosis Date  . Anxiety 2013   treated at [facility] by Pauline Good   . Asthma 1989    no hospitalization, or intubation, previously on advair and singulair. asthma worsening.   . 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 Depression 2001   depressed since childhood   . 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  Essential hypertension 03/12/2019  . Kyphosis of thoracic region 2004    painful, runs in dad side   . Migraine   . Pancreatitis 2003   gallstone pancreatitis   . Pancreatitis due to biliary obstruction 2003  . PCOS (polycystic ovarian syndrome) 2014   facial hair, irregular periods, never been pregnant     Past Surgical History:  Procedure Laterality Date  . CHOLECYSTECTOMY  2003   . OPEN REDUCTION INTERNAL FIXATION (ORIF) PROXIMAL PHALANX Right 07/23/2013   Procedure: OPEN TREATMENT RIGHT RING FINGER, PROXIMAL INTERPHALANGEAL/JOINT FRACTURE DISLOCATION;  Surgeon: Jolyn Nap, MD;  Location: [facility];  Service: Orthopedics;  Laterality: Right;  . TONSILLECTOMY      There were no vitals filed for this visit.  Subjective Assessment - 05/11/19 1508    Currently in Pain?  No/denies    Pain Score  0-No pain        [facility] OT Assessment - 05/11/19 0001      Assessment   Medical Diagnosis  Difficulty Coping    Referring Provider (OT)  Ricky Ala      Precautions   Precautions  None      Restrictions   Weight Bearing Restrictions  No      Balance Screen   Has the patient fallen in the past 6 months  No  Has the patient had a decrease in activity level because of a fear of falling?   No    Is the patient reluctant to leave their home because of a fear of falling?   No      Prior Function   Level of Independence  Independent with basic ADLs        OT assessment Diagnosis  MDD Past medical history/referral information: social anxiety, scoliosis, IBS Living situation lives with parents whom are disabled ADLs independent  Work CMA and Engineer, technical sales Neuro Leisure:  None identified Social support mom and fiance Struggles social interaction OT goal learning to process what others are saying and [employer] Mental Health Interview Summary of Client Scores:  FACILITATES PARTICIPATION IN OCCUPATION  ALLOWS PARTICIPATION IN OCCUPATION INHIBITS PARTICIPATION  IN OCCUPATION RESTRICTS PARTICIPATION IN OCCUPATION COMMENTS  ROLES    x   HABITS    x   PERSONAL CAUSATION    x   VALUES    x   INTERESTS    x   SKILLS    x   SHORT TERM GOALS    x   LONG TERM GOALS    x   INTERPETATION OF PAST EXPERIENCES    x   PHYSICAL ENVIRONMENT  x     SOCIAL ENVIRONMENT   x    READINESS FOR CHANGE    x    Need for Occupational Therapy:  4 Shows positive occupational participation, no need for OT.   3 Need for minimal intervention/consultative participation   2 Need for OT intervention indicated to restore/improve participation  x 1 Need for extensive OT intervention indicated to improve participation.  Referral for follow up services also recommended.    O:  Patient participated in skilled occupational therapy group this day focusing on improving self-esteem.  At opening of group, members shared one of their proudest accomplishments, as well as the state of their current self-esteem.  Group was educated on definition of self-esteem, high and low self-esteem, Causes of low self-esteem, including past experiences, social media, isolation, health, other peoples viewpoints, relationships.  Group discussed the close knit relationship of low self-esteem and mental health problems.  Group was then educated on ways to improve self-esteem, including doing tasks that provide enjoyment, work, hobbies, involvement in positive relationships, assertiveness, physical activity, sleep, diet, challenging self, positive thinking.  Group also acknowledged that we must determine the root cause of our negative beliefs and challenge those beliefs as a first step towards improving self-esteem.  Other tools the group learned were focusing on the positive, fact checking, and learning self-compassion.  Group concluded with patient sharing one new strategy they will implement to build their self-esteem.       Assessment:  Patient demonstrates behavior that restricts participation in occupation.   Patient will benefit from occupational therapy intervention in order to improve time management, financial management, stress management, job readiness skills, social skills, and health management skills in preparation to return to full time community living and to be a productive community member.  During self esteem group, Kristen [name] was engaged and shared that she would review her a-z list to start focusing on her good qualities.  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 Plan:  Patient will participate in skilled occupational therapy sessions individually or in a group setting to improve coping skills, psychosocial skills, and emotional skills required to return to prior level of function. Treatment will be 2 times per week for 3 weeks  with next group to focus on assertiveness training.                 OT Education - 05/11/19 1508    Education Details  educated on tips for improving self esteem    Person(s) Educated  Patient    Methods  Explanation    Comprehension  Verbalized understanding       OT Short Term Goals - 05/11/19 1510      OT SHORT TERM GOAL #1   Title  Pt will be educated on strategies to improve psychosocial skills needed to participate fully in all daily, work, and leisure activities.    Time  4    Period  Weeks    Status  New    Target Date  06/08/19      OT SHORT TERM GOAL #2   Title  Pt will apply psychosocial skills and coping mechanisms to daily activities in order to function independently and reintegrate into community    Time  4    Period  Weeks    Status  New      OT SHORT TERM GOAL #3   Title  Pt will engage in goal setting to improve functional BADL/IADL routine upon reintegrating into community.    Time  4    Period  Weeks    Status  New      OT SHORT TERM GOAL #4   Title  Pt will recall and/or apply 1-3 sleep hygiene strategies to improve function in BADL routine upon reintegrating into community    Time  4    Period  Weeks     Status  New      OT SHORT TERM GOAL #5   Title  Pt will create and implement functional BADL/IADL routine upon reintegrating into the community for increased engagement in all daily, work, and leisure activities    Time  4    Period  Weeks    Status  New               Plan - 05/11/19 1509    OT Occupational Profile and History  Problem Focused Assessment - Including review of records relating to presenting problem    Occupational performance deficits (Please refer to evaluation for details):  ADL's;IADL's;Rest and Sleep;Work;Leisure;Social Participation    Body Structure / Function / Physical Skills  ADL    Cognitive Skills  Attention;Emotional;Energy/Drive;Temperament/Personality;Thought;Understand    Psychosocial Skills  Coping Strategies;Environmental  Adaptations;Habits;Interpersonal Interaction;Routines and Behaviors    Rehab Potential  Good    Clinical Decision Making  Limited treatment options, no task modification necessary    Comorbidities Affecting Occupational Performance:  May have comorbidities impacting occupational performance    Modification or Assistance to Complete Evaluation   No modification of tasks or assist necessary to complete eval    OT Frequency  2x / week    OT Duration  4 weeks    OT Treatment/Interventions  Self-care/ADL training;Psychosocial skills training;Coping strategies training;Patient/family education    Consulted and Agree with Plan of Care  Patient       Patient will benefit from skilled therapeutic intervention in order to improve the following deficits and impairments:   Body Structure / Function / Physical Skills: ADL Cognitive Skills: Attention, Emotional, Energy/Drive, Temperament/Personality, Thought, Understand Psychosocial Skills: Coping Strategies, Environmental  Adaptations, Habits, Interpersonal Interaction, Routines and Behaviors   Visit Diagnosis: Difficulty coping  Social anxiety disorder  MDD (major depressive  disorder), recurrent severe, without psychosis (  HCC)  Frontal lobe and executive function deficit    Problem List Patient Active Problem List   Diagnosis Date Noted  . 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 Abnormal serum thyroid stimulating hormone (TSH) level 04/23/2019  . MDD (major depressive disorder), recurrent severe, 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  . OSA on CPAP 03/17/2019  . Anxiety and depression 03/12/2019  . Chronic migraine with aura 03/12/2019  . Essential hypertension 03/12/2019  . Mild intermittent asthma without complication 03/12/2019  . 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 Elevated prolactin level 07/05/2016  . Vulvar lesion 07/05/2016  . Restless legs 05/24/2016  . Seasonal allergies 05/13/2014  . Intertrigo 05/13/2014  . Decreased vision 05/13/2014  . PCOS (polycystic ovarian syndrome) 02/07/2014  . Social phobia 02/02/2007  . ADD 02/02/2007  . 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 Asthma, severe persistent 02/02/2007  . SCOLIOSIS 02/02/2007  . OTHER SPECIFIED CONGENITAL ANOMALIES OF KIDNEY 02/02/2007  . ELECTROCARDIOGRAM, ABNORMAL 02/02/2007    Shirlean Mylar, MHA, OTR/L [phone]  05/11/2019, 3:14 PM  [facility] PARTIAL HOSPITALIZATION PROGRAM [address] [place], [place], [address] Phone: [phone]   Fax:  [phone]  Name: Kristen [name] MRN: [id number] Date of Birth: [birth year]

## 2019-05-12 ENCOUNTER — Ambulatory Visit (HOSPITAL_COMMUNITY): Payer: No Typology Code available for payment source | Admitting: Psychiatry

## 2019-05-12 ENCOUNTER — Other Ambulatory Visit (HOSPITAL_COMMUNITY): Payer: No Typology Code available for payment source | Admitting: Licensed Clinical Social Worker

## 2019-05-12 ENCOUNTER — Telehealth (HOSPITAL_COMMUNITY): Payer: Self-pay | Admitting: Professional

## 2019-05-12 ENCOUNTER — Encounter (HOSPITAL_COMMUNITY): Payer: Self-pay | Admitting: Family

## 2019-05-12 ENCOUNTER — Other Ambulatory Visit: Payer: Self-pay

## 2019-05-12 DIAGNOSIS — F332 Major depressive disorder, recurrent severe without psychotic features: Secondary | ICD-10-CM

## 2019-05-12 DIAGNOSIS — F419 Anxiety disorder, unspecified: Secondary | ICD-10-CM | POA: Diagnosis not present

## 2019-05-12 NOTE — Progress Notes (Signed)
Pt attended spiritual care group 05/12/2018    11:00-12:00.  Group met via web-ex due to COVID-19 precautions.  Group facilitated by Simone Curia, MDiv, [place]   Group focused on topic of "community."  Members reflected on topic in facilitated dialog, identifying responses to topic and notions they hold of community from their previous experience.  Group members utilized value sort cards to identify top qualities they look for in community.  Engaged in facilitated dialog around their value choices, noting origin of these values, how these are realized in their lives, and strategies for engaging these values.    Spiritual care group drew on Motivational Interviewing, Narrative and Adlerian modalities.  Colletta [place] participated throughout group.  Was particularly energized around connection with the cosplay community - noting with another group member the lack of judgment in this community.  Kynlee chose the values of "family, romance, creativity, ecology, world peace, beauty"   Noted that she is working on finding beauty within herself and honoring herself the way she honors other entities.

## 2019-05-12 NOTE — Telephone Encounter (Signed)
My primary put in to get an accomodation sheet but I want to go for disability. My employer isn't going to accommodate it so I am no longer employed with [employer] so my insurance runs out this month. Pt wants to know if she can be referred to neuropsychological to be tested for Autism. Pt reports she had an appointment with Dr. [name] Mustache today but was told by "his nurse" that she would not see him since she is in [facility]. Pt reports she needs paperwork for disability. Cln reports this cln or Tanika would be unable to provide paperwork for disability since FMLA/STD is all provider will complete for PHP. Pt understood. Cln will email Pam, CMA that works with Dr. [name] Mustache, to ask Pam to reach out to pt to explain process and answer other questions this cln is unable to answer. Pt understands.

## 2019-05-13 ENCOUNTER — Other Ambulatory Visit (HOSPITAL_COMMUNITY): Payer: No Typology Code available for payment source | Admitting: Licensed Clinical Social Worker

## 2019-05-13 ENCOUNTER — Other Ambulatory Visit (HOSPITAL_COMMUNITY): Payer: No Typology Code available for payment source

## 2019-05-13 ENCOUNTER — Other Ambulatory Visit: Payer: Self-pay

## 2019-05-13 DIAGNOSIS — F332 Major depressive disorder, recurrent severe without psychotic features: Secondary | ICD-10-CM

## 2019-05-13 DIAGNOSIS — F419 Anxiety disorder, unspecified: Secondary | ICD-10-CM | POA: Diagnosis not present

## 2019-05-14 ENCOUNTER — Encounter (HOSPITAL_COMMUNITY): Payer: Self-pay

## 2019-05-14 ENCOUNTER — Other Ambulatory Visit: Payer: Self-pay

## 2019-05-14 ENCOUNTER — Other Ambulatory Visit (HOSPITAL_COMMUNITY): Payer: No Typology Code available for payment source | Admitting: Licensed Clinical Social Worker

## 2019-05-14 ENCOUNTER — Other Ambulatory Visit (HOSPITAL_COMMUNITY): Payer: No Typology Code available for payment source | Admitting: Specialist

## 2019-05-14 DIAGNOSIS — R4589 Other symptoms and signs involving emotional state: Secondary | ICD-10-CM

## 2019-05-14 DIAGNOSIS — R41844 Frontal lobe and executive function deficit: Secondary | ICD-10-CM

## 2019-05-14 DIAGNOSIS — F332 Major depressive disorder, recurrent severe without psychotic features: Secondary | ICD-10-CM

## 2019-05-14 DIAGNOSIS — F419 Anxiety disorder, unspecified: Secondary | ICD-10-CM | POA: Diagnosis not present

## 2019-05-14 DIAGNOSIS — F4010 Social phobia, unspecified: Secondary | ICD-10-CM

## 2019-05-14 NOTE — Therapy (Signed)
Virtual Visit via Video Note  I connected with Kristen [name] on 05/14/19 at  1100 AM EDT by a video enabled telemedicine application and verified that I am speaking with the correct person using two identifiers.   I discussed the limitations of evaluation and management by telemedicine and the availability of in person appointments. The patient expressed understanding and agreed to proceed.      I discussed the assessment and treatment plan with the patient. The patient was provided an opportunity to ask questions and all were answered. The patient agreed with the plan and demonstrated an understanding of the instructions.   The patient was advised to call back or seek an in-person evaluation if the symptoms worsen or if the condition fails to improve as anticipated.  I provided 60 minutes of non-face-to-face time during this encounter.     [facility] PARTIAL HOSPITALIZATION PROGRAM [address] [place], [place], [address] Phone: [phone]   Fax:  [phone]  Occupational Therapy Treatment  Patient Details  Name: Kristen [name] MRN: [id number] Date of Birth: 1985/02/15 Referring Provider (OT): Hillery Jacks   Encounter Date: 05/14/2019  OT End of Session - 05/14/19 1348    Visit Number  2    Number of Visits  8    Date for OT Re-Evaluation  06/08/19    Authorization Type  no auth required    Progress Note Due on Visit  10    Activity Tolerance  Patient tolerated treatment well    Behavior During Therapy  [facility] for tasks assessed/performed       Past Medical History:  Diagnosis Date  . Anxiety 2013   treated at [facility] by Deatra Robinson   . Asthma 1989    no hospitalization, or intubation, previously on advair and singulair. asthma worsening.   . 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 Depression 2001   depressed since childhood   . 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 Essential hypertension 03/12/2019  . Kyphosis of  thoracic region 2004    painful, runs in dad side   . Migraine   . Pancreatitis 2003   gallstone pancreatitis   . Pancreatitis due to biliary obstruction 2003  . PCOS (polycystic ovarian syndrome) 2014   facial hair, irregular periods, never been pregnant     Past Surgical History:  Procedure Laterality Date  . CHOLECYSTECTOMY  2003   . OPEN REDUCTION INTERNAL FIXATION (ORIF) PROXIMAL PHALANX Right 07/23/2013   Procedure: OPEN TREATMENT RIGHT RING FINGER, PROXIMAL INTERPHALANGEAL/JOINT FRACTURE DISLOCATION;  Surgeon: Jodi Marble, MD;  Location: [facility];  Service: Orthopedics;  Laterality: Right;  . TONSILLECTOMY      There were no vitals filed for this visit.  Subjective Assessment - 05/14/19 1339    Currently in Pain?  No/denies       S:  I am not assertive because I fear rejection. Group began with reflection on circle of control from previous session.  Group members shared their reflections and progress with use of this coping tool.  Group focus today was on communication tool of assertiveness.  Group completed an assertive quiz, which identified if they tended to be assertive all of the time, often, occasionally, or never.  Group members reflected on whether their score correlated with their beliefs in their actions.  Group explored connotation of assertiveness and determined it was almost always a positive trait.  Group  was educated on benefits of being assertive, including:  self esteem, goal achievement, anxiety reduction, development of mutual respect for others, improved decision making and empowerment, and improved self expression.  Group reflected on roadblocks they experience that prevents them from being assertive, including lack of confidence, fearful of coming across as aggressive, and being too emotional to do so.  Patient was educated on definition of passive, assertive, and aggressive communication.  Benefits and drawbacks of each communication style,  nonverbal communication/body language associated with each communication style.  Tips for communicating assertively were shared with group, including:  use of I statements, clearly stating your needs, non verbal skills, self respect, speaking calmly, planning and rehearsing conversation/email/text response, scheduling time to respond to an unplanned conversation that causes emotional response/trigger, and use of the x/y/z formula.  The x y z formula uses the following formula:   I feel X when you do Y in situation Z and I would like _________.  (X = emotion, Y = specific behavior , Z = specific situation).  Each member then formulated a xyz statement for a particular situation they are currently encountering and shared with the group.  Group and OT provided feedback on components being present or not in statement.  Group also discussed and were educated on strategies to assertively sayi no; including no as a sentence, assertively refusing requests, assertive ways of saying no, and steps in learning to say no.    A:  Kristen [name] was engaged throughout group.  Kristen [name] developed the following xyz statement:  I feel invalidated when you talk over me and belittle me when we talk about politics.  I would like [place] to avoid this subject of conversation.  Therapist shared strategies to continue her assertive communication in the event her dad did not agree to this.  Kristen [name] committed to trying this xyz statement over the weekend. P:  Continue skilled OT intervention 2 times per week for 4 weeks.  Next session:  Sleep hygiene.                  OT Education - 05/14/19 1348    Education Details  educated on strategies for improved assertive communication    Person(s) Educated  Patient    Methods  Explanation    Comprehension  Verbalized understanding       OT Short Term Goals - 05/14/19 1349      OT SHORT TERM GOAL #1   Title  Pt will be educated on strategies to improve psychosocial skills  needed to participate fully in all daily, work, and leisure activities.    Time  4    Period  Weeks    Status  On-going    Target Date  06/08/19      OT SHORT TERM GOAL #2   Title  Pt will apply psychosocial skills and coping mechanisms to daily activities in order to function independently and reintegrate into community    Time  4    Period  Weeks    Status  On-going      OT SHORT TERM GOAL #3   Title  Pt will engage in goal setting to improve functional BADL/IADL routine upon reintegrating into community.    Time  4    Period  Weeks    Status  On-going      OT SHORT TERM GOAL #4   Title  Pt will recall and/or apply 1-3 sleep hygiene strategies to improve function in BADL routine upon reintegrating into  community    Time  4    Period  Weeks    Status  On-going      OT SHORT TERM GOAL #5   Title  Pt will create and implement functional BADL/IADL routine upon reintegrating into the community for increased engagement in all daily, work, and leisure activities    Time  4    Period  Weeks    Status  New               Plan - 05/14/19 1348    Body Structure / Function / Physical Skills  ADL    Cognitive Skills  Attention;Emotional;Energy/Drive;Temperament/Personality;Thought;Understand    Psychosocial Skills  Coping Strategies;Environmental  Adaptations;Habits;Interpersonal Interaction;Routines and Behaviors       Patient will benefit from skilled therapeutic intervention in order to improve the following deficits and impairments:   Body Structure / Function / Physical Skills: ADL Cognitive Skills: Attention, Emotional, Energy/Drive, Temperament/Personality, Thought, Understand Psychosocial Skills: Coping Strategies, Environmental  Adaptations, Habits, Interpersonal Interaction, Routines and Behaviors   Visit Diagnosis: Severe episode of recurrent major depressive disorder, without psychotic features (HCC)  Difficulty coping  Social anxiety disorder  Frontal lobe  and executive function deficit    Problem List Patient Active Problem List   Diagnosis Date Noted  . 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 Abnormal serum thyroid stimulating hormone (TSH) level 04/23/2019  . MDD (major depressive disorder), recurrent severe, 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  . OSA on CPAP 03/17/2019  . Anxiety and depression 03/12/2019  . Chronic migraine with aura 03/12/2019  . Essential hypertension 03/12/2019  . Mild intermittent asthma without complication 03/12/2019  . 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 Elevated prolactin level 07/05/2016  . Vulvar lesion 07/05/2016  . Restless legs 05/24/2016  . Seasonal allergies 05/13/2014  . Intertrigo 05/13/2014  . Decreased vision 05/13/2014  . PCOS (polycystic ovarian syndrome) 02/07/2014  . Social phobia 02/02/2007  . ADD 02/02/2007  . 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 Asthma, severe persistent 02/02/2007  . SCOLIOSIS 02/02/2007  . OTHER SPECIFIED CONGENITAL ANOMALIES OF KIDNEY 02/02/2007  . ELECTROCARDIOGRAM, ABNORMAL 02/02/2007    Shirlean Mylar, MHA, OTR/L [phone]  05/14/2019, 1:49 PM  [facility] PARTIAL HOSPITALIZATION PROGRAM [address] [place], [place], [address] Phone: [phone]   Fax:  [phone]  Name: Kristen [name] MRN: [id number] Date of Birth: [birth year]

## 2019-05-17 ENCOUNTER — Other Ambulatory Visit (HOSPITAL_COMMUNITY): Payer: No Typology Code available for payment source | Admitting: Licensed Clinical Social Worker

## 2019-05-17 ENCOUNTER — Other Ambulatory Visit: Payer: Self-pay

## 2019-05-17 DIAGNOSIS — F419 Anxiety disorder, unspecified: Secondary | ICD-10-CM | POA: Diagnosis not present

## 2019-05-17 DIAGNOSIS — F332 Major depressive disorder, recurrent severe without psychotic features: Secondary | ICD-10-CM

## 2019-05-17 NOTE — Progress Notes (Signed)
Spoke with patient via Webex video call, used 2 identifiers to correctly identify patient. She states groups are going well. She is hoping to get on disability. Her PCP wrote her out of work for just part time but she is unsure if she will find a job within [employer] that is part time. She has applied. She is sleeping better this week but still feels tired. Denies SI/HI or AV hallucinations. On scale 1-10 as 10 being worst she rates depression at 6 and anxiety at 6. No side effects from medications. No issues or complaints.

## 2019-05-18 ENCOUNTER — Encounter (HOSPITAL_COMMUNITY): Payer: Self-pay

## 2019-05-18 ENCOUNTER — Other Ambulatory Visit: Payer: Self-pay

## 2019-05-18 ENCOUNTER — Other Ambulatory Visit (HOSPITAL_COMMUNITY): Payer: No Typology Code available for payment source | Admitting: Licensed Clinical Social Worker

## 2019-05-18 ENCOUNTER — Other Ambulatory Visit (HOSPITAL_COMMUNITY): Payer: No Typology Code available for payment source | Admitting: Specialist

## 2019-05-18 DIAGNOSIS — F419 Anxiety disorder, unspecified: Secondary | ICD-10-CM | POA: Diagnosis not present

## 2019-05-18 DIAGNOSIS — R4589 Other symptoms and signs involving emotional state: Secondary | ICD-10-CM

## 2019-05-18 DIAGNOSIS — F32A Depression, unspecified: Secondary | ICD-10-CM

## 2019-05-18 DIAGNOSIS — F332 Major depressive disorder, recurrent severe without psychotic features: Secondary | ICD-10-CM

## 2019-05-18 DIAGNOSIS — F329 Major depressive disorder, single episode, unspecified: Secondary | ICD-10-CM

## 2019-05-18 DIAGNOSIS — R41844 Frontal lobe and executive function deficit: Secondary | ICD-10-CM

## 2019-05-18 NOTE — BH Assessment (Signed)
Q-Acutal 2-week PHQ 2-9 completed   PHQ 2 is 3 PHQ 9 is 13

## 2019-05-18 NOTE — Therapy (Signed)
Virtual Visit via Video Note  I connected with Kristen [name] on 05/18/19 at  11:00 AM EDT by a video enabled telemedicine application and verified that I am speaking with the correct person using two identifiers.   I discussed the limitations of evaluation and management by telemedicine and the availability of in person appointments. The patient expressed understanding and agreed to proceed.     I discussed the assessment and treatment plan with the patient. The patient was provided an opportunity to ask questions and all were answered. The patient agreed with the plan and demonstrated an understanding of the instructions.   The patient was advised to call back or seek an in-person evaluation if the symptoms worsen or if the condition fails to improve as anticipated.  I provided 60 minutes of non-face-to-face time during this encounter.     [facility] PARTIAL HOSPITALIZATION PROGRAM [address] [place], [place], [address] Phone: [phone]   Fax:  [phone]  Occupational Therapy Treatment  Patient Details  Name: Kristen [name] MRN: [id number] Date of Birth: [birth year] Referring Provider (OT): Hillery Jacks   Encounter Date: 05/18/2019  OT End of Session - 05/18/19 1348    Visit Number  3    Number of Visits  8    Date for OT Re-Evaluation  06/08/19    Authorization Type  no auth required    Progress Note Due on Visit  10    OT Start Time  1100    OT Stop Time  1200    OT Time Calculation (min)  60 min    Activity Tolerance  Patient tolerated treatment well    Behavior During Therapy  [facility] for tasks assessed/performed       Past Medical History:  Diagnosis Date  . Anxiety 2013   treated at [facility] by Deatra Robinson   . Asthma 1989    no hospitalization, or intubation, previously on advair and singulair. asthma worsening.   . 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 Depression 2001   depressed since childhood   .  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 Essential hypertension 03/12/2019  . Kyphosis of thoracic region 2004    painful, runs in dad side   . Migraine   . Pancreatitis 2003   gallstone pancreatitis   . Pancreatitis due to biliary obstruction 2003  . PCOS (polycystic ovarian syndrome) 2014   facial hair, irregular periods, never been pregnant     Past Surgical History:  Procedure Laterality Date  . CHOLECYSTECTOMY  2003   . OPEN REDUCTION INTERNAL FIXATION (ORIF) PROXIMAL PHALANX Right 07/23/2013   Procedure: OPEN TREATMENT RIGHT RING FINGER, PROXIMAL INTERPHALANGEAL/JOINT FRACTURE DISLOCATION;  Surgeon: Jodi Marble, MD;  Location: [facility];  Service: Orthopedics;  Laterality: Right;  . TONSILLECTOMY      There were no vitals filed for this visit.  Subjective Assessment - 05/18/19 1348    Currently in Pain?  No/denies          S:  I think I need to decrease my caffeine intake.  I sleep 6 hours per night. O: patient participated in skilled OT group focusing on improving sleep hygiene.  Patient was educated and discussed benefits of getting enough sleep, detriments of getting too much or too little sleep.  Patient and group discussed strategies for improving sleep including routines, environmental factors, diet, exercises, calming activities.  Patient was educated  on use of sleep diary to identify current sleep patterns and strategies to improve sleep routine.   A:  Patient identified several new sleep hygiene technique to complete at home, including decreasing caffeine intake, naps, and light in her room.  . P:  continue skilled OT group 2 times per week for 3 weeks.                  OT Education - 05/18/19 1348    Education Details  educated on strategies for improved sleep hygiene    Person(s) Educated  Patient    Methods  Explanation    Comprehension  Verbalized understanding       OT Short Term Goals - 05/14/19 1349      OT SHORT TERM GOAL #1    Title  Pt will be educated on strategies to improve psychosocial skills needed to participate fully in all daily, work, and leisure activities.    Time  4    Period  Weeks    Status  On-going    Target Date  06/08/19      OT SHORT TERM GOAL #2   Title  Pt will apply psychosocial skills and coping mechanisms to daily activities in order to function independently and reintegrate into community    Time  4    Period  Weeks    Status  On-going      OT SHORT TERM GOAL #3   Title  Pt will engage in goal setting to improve functional BADL/IADL routine upon reintegrating into community.    Time  4    Period  Weeks    Status  On-going      OT SHORT TERM GOAL #4   Title  Pt will recall and/or apply 1-3 sleep hygiene strategies to improve function in BADL routine upon reintegrating into community    Time  4    Period  Weeks    Status  On-going      OT SHORT TERM GOAL #5   Title  Pt will create and implement functional BADL/IADL routine upon reintegrating into the community for increased engagement in all daily, work, and leisure activities    Time  4    Period  Weeks    Status  New               Plan - 05/18/19 1349    Body Structure / Function / Physical Skills  ADL    Cognitive Skills  Attention;Emotional;Energy/Drive;Temperament/Personality;Thought;Understand    Psychosocial Skills  Coping Strategies;Environmental  Adaptations;Habits;Interpersonal Interaction;Routines and Behaviors       Patient will benefit from skilled therapeutic intervention in order to improve the following deficits and impairments:   Body Structure / Function / Physical Skills: ADL Cognitive Skills: Attention, Emotional, Energy/Drive, Temperament/Personality, Thought, Understand Psychosocial Skills: Coping Strategies, Environmental  Adaptations, Habits, Interpersonal Interaction, Routines and Behaviors   Visit Diagnosis: Severe episode of recurrent major depressive disorder, without psychotic  features (HCC)  Difficulty coping  Frontal lobe and executive function deficit    Problem List Patient Active Problem List   Diagnosis Date Noted  . 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 Abnormal serum thyroid stimulating hormone (TSH) level 04/23/2019  . MDD (major depressive disorder), recurrent severe, 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  . OSA on CPAP 03/17/2019  . Anxiety and depression 03/12/2019  . Chronic migraine with aura 03/12/2019  . Essential hypertension 03/12/2019  . Mild intermittent asthma without complication 03/12/2019  . Lymphocytic hypophysitis (HCC)  08/15/2016  . Elevated prolactin level 07/05/2016  . Vulvar lesion 07/05/2016  . Restless legs 05/24/2016  . Seasonal allergies 05/13/2014  . Intertrigo 05/13/2014  . Decreased vision 05/13/2014  . PCOS (polycystic ovarian syndrome) 02/07/2014  . Social phobia 02/02/2007  . ADD 02/02/2007  . 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 Asthma, severe persistent 02/02/2007  . SCOLIOSIS 02/02/2007  . OTHER SPECIFIED CONGENITAL ANOMALIES OF KIDNEY 02/02/2007  . ELECTROCARDIOGRAM, ABNORMAL 02/02/2007    Vangie Bicker, MHA, OTR/L [phone]  05/18/2019, 1:49 PM  [place], [place], [address] Phone: [phone]   Fax:  [phone]  Name: FRANKEE GRITZ MRN: [id number] Date of Birth: 01-22-1986

## 2019-05-19 ENCOUNTER — Other Ambulatory Visit (HOSPITAL_COMMUNITY): Payer: No Typology Code available for payment source | Admitting: Licensed Clinical Social Worker

## 2019-05-19 ENCOUNTER — Encounter (HOSPITAL_COMMUNITY): Payer: Self-pay | Admitting: Family

## 2019-05-19 ENCOUNTER — Other Ambulatory Visit: Payer: Self-pay

## 2019-05-19 DIAGNOSIS — F332 Major depressive disorder, recurrent severe without psychotic features: Secondary | ICD-10-CM

## 2019-05-19 DIAGNOSIS — F419 Anxiety disorder, unspecified: Secondary | ICD-10-CM | POA: Diagnosis not present

## 2019-05-19 NOTE — Progress Notes (Signed)
Virtual Visit via Video Note  I connected with Kristen [name] on 05/19/19 at  9:00 AM EDT by a video enabled telemedicine application and verified that I am speaking with the correct person using two identifiers.   I discussed the limitations of evaluation and management by telemedicine and the availability of in person appointments. The patient expressed understanding and agreed to proceed.   I discussed the assessment and treatment plan with the patient. The patient was provided an opportunity to ask questions and all were answered. The patient agreed with the plan and demonstrated an understanding of the instructions.   The patient was advised to call back or seek an in-person evaluation if the symptoms worsen or if the condition fails to improve as anticipated.  I provided 15 minutes of non-face-to-face time during this encounter.   Derrill Center, NP   BH MD/PA/NP OP Progress Note  05/19/2019 10:22 AM Kristen [name]  MRN:  [id number]   Evaluation: Kristen [name] was seen and evaluated via WebEx.  She reports feeling better overall.  Denying suicidal or homicidal ideations.  Denies auditory or visual hallucinations.  Patient rates her depression and anxiety 5-6 out of 10 with 10 being the worst.  Reports she continues to struggle with racing thoughts mainly at night and difficulty falling asleep.  Reports a recent medication adjustment and has been planning to keep her mind busy as she has been attempting to multitask throughout the day.  Reports a good appetite.  Reports when she is able to fall asleep her sleep is restful.  Patient to continue daily group sessions.  Support, encouragement and  reassurance was provided.  Visit Diagnosis: No diagnosis found.  Past Psychiatric History:   Past Medical History:  Past Medical History:  Diagnosis Date  . Anxiety 2013   treated at [facility] by Pauline Good   . Asthma 1989    no hospitalization, or intubation, previously on advair and  singulair. asthma worsening.   . 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 Depression 2001   depressed since childhood   . 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 Essential hypertension 03/12/2019  . Kyphosis of thoracic region 2004    painful, runs in dad side   . Migraine   . Pancreatitis 2003   gallstone pancreatitis   . Pancreatitis due to biliary obstruction 2003  . PCOS (polycystic ovarian syndrome) 2014   facial hair, irregular periods, never been pregnant     Past Surgical History:  Procedure Laterality Date  . CHOLECYSTECTOMY  2003   . OPEN REDUCTION INTERNAL FIXATION (ORIF) PROXIMAL PHALANX Right 07/23/2013   Procedure: OPEN TREATMENT RIGHT RING FINGER, PROXIMAL INTERPHALANGEAL/JOINT FRACTURE DISLOCATION;  Surgeon: Jolyn Nap, MD;  Location: [place];  Service: Orthopedics;  Laterality: Right;  . TONSILLECTOMY      Family Psychiatric History:   Family History:  Family History  Problem Relation Age of Onset  . Hypertension Mother   . Arnold-Chiari malformation Mother   . Restless legs syndrome Mother   . Osteoporosis Mother   . COPD Mother   . Asthma Mother   . Squamous cell carcinoma Mother        skin   . Eczema Mother   . Arthritis Mother   . Peripheral Artery Disease Mother   . Anxiety disorder Mother   . OCD Mother   . Hypertension Father   . Scoliosis Father   . Bipolar  disorder Father   . Congestive Heart Failure Father   . COPD Father   . Depression Father   . Diabetes Maternal Grandmother   . Hypertension Maternal Grandmother   . Dementia Maternal Grandmother   . OCD Maternal Grandmother   . Cancer Paternal Grandmother        colon  . Bone cancer Maternal Grandfather 82       bone marrow   . Multiple myeloma Maternal Grandfather   . Alcohol abuse Maternal Grandfather   . Drug abuse Maternal Grandfather   . Diabetes Paternal Grandfather   . Alcohol abuse Paternal Grandfather   . Drug abuse Paternal Grandfather    . Dementia Paternal Grandfather   . Alcohol abuse Maternal Aunt   . Depression Maternal Aunt   . Alcohol abuse Paternal Aunt   . Depression Paternal Aunt   . ADD / ADHD Cousin     Social History:  Social History   Socioeconomic History  . Marital status: Single    Spouse name: Not on file  . Number of children: 0  . Years of education: college   . Highest education level: Not on file  Occupational History  . Occupation: Unemployed   Tobacco Use  . Smoking status: Never Smoker  . Smokeless tobacco: Never Used  Substance and Sexual Activity  . Alcohol use: No  . Drug use: No  . Sexual activity: Yes    Birth control/protection: None  Other Topics Concern  . Not on file  Social History Narrative   Lives with mom Kristen [name])    Right-handed   Caffeine: 3 cups of coffee per day   Social Determinants of Health   Financial Resource Strain:   . Difficulty of Paying Living Expenses:   Food Insecurity:   . Worried About Charity fundraiser in the Last Year:   . Arboriculturist in the Last Year:   Transportation Needs:   . Film/video editor (Medical):   Marland Kitchen Lack of Transportation (Non-Medical):   Physical Activity:   . Days of Exercise per Week:   . Minutes of Exercise per Session:   Stress:   . Feeling of Stress :   Social Connections:   . Frequency of Communication with Friends and Family:   . Frequency of Social Gatherings with Friends and Family:   . Attends Religious Services:   . Active Member of Clubs or Organizations:   . Attends Archivist Meetings:   Marland Kitchen Marital Status:     Allergies:  Allergies  Allergen Reactions  . Mucinex [Guaifenesin Er] Hives, Itching and Swelling  . Penicillins Swelling    Did it involve swelling of the face/tongue/throat, SOB, or low BP? Yes Did it involve sudden or severe rash/hives, skin peeling, or any reaction on the inside of your mouth or nose? No Did you need to seek medical attention at a hospital or doctor's  office? Yes When did it last happen?>10 years ago If all above answers are "NO", may proceed with cephalosporin use.   . Contrast Media [Iodinated Diagnostic Agents] Nausea And Vomiting  . Multihance [Gadobenate] Nausea And Vomiting    Metabolic Disorder Labs: Lab Results  Component Value Date   HGBA1C 4.9 04/15/2019   MPG 93.93 04/15/2019   Lab Results  Component Value Date   PROLACTIN 15.9 09/18/2016   PROLACTIN 32.9 (H) 06/20/2016   Lab Results  Component Value Date   CHOL 204 (H) 04/15/2019   TRIG 146 04/15/2019  HDL 41 04/15/2019   CHOLHDL 5.0 04/15/2019   VLDL 29 04/15/2019   LDLCALC 134 (H) 04/15/2019   Lab Results  Component Value Date   TSH 4.628 (H) 04/15/2019   TSH 2.140 07/22/2017    Therapeutic Level Labs: No results found for: LITHIUM No results found for: VALPROATE No components found for:  CBMZ  Current Medications: Current Outpatient Medications  Medication Sig Dispense Refill  . albuterol (VENTOLIN HFA) 108 (90 Base) MCG/ACT inhaler Inhale 2 puffs into the lungs every 6 (six) hours as needed for wheezing or shortness of breath. 6.7 g 3  . busPIRone (BUSPAR) 10 MG tablet Take 1 tablet (10 mg total) by mouth 2 (two) times daily. For anxiety 60 tablet 0  . hydrOXYzine (ATARAX/VISTARIL) 25 MG tablet Take 1 tablet (25 mg total) by mouth 3 (three) times daily as needed for anxiety. 75 tablet 0  . naproxen (NAPROSYN) 500 MG tablet Take 1 tablet (500 mg total) by mouth 2 (two) times daily with a meal. (May buy from over the counter): For pain    . propranolol (INDERAL) 20 MG tablet Take 1 tablet (20 mg total) by mouth 2 (two) times daily. For anxiety 60 tablet 0  . sertraline (ZOLOFT) 50 MG tablet Take 1 tablet (50 mg total) by mouth daily. For depression 30 tablet 0  . traZODone (DESYREL) 50 MG tablet Take 1 tablet (50 mg total) by mouth at bedtime as needed for sleep. 30 tablet 0   No current facility-administered medications for this visit.      Musculoskeletal:  Psychiatric Specialty Exam: Review of Systems  There were no vitals taken for this visit.There is no height or weight on file to calculate BMI.  General Appearance: Casual  Eye Contact:  Good  Speech:  Clear and Coherent  Volume:  Normal  Mood:  Anxious  Affect:  Depressed and Flat  Thought Process:  Coherent  Orientation:  Full (Time, Place, and Person)  Thought Content: Hallucinations: None   Suicidal Thoughts:  No  Homicidal Thoughts:  No  Memory:  Immediate;   Fair Recent;   Fair  Judgement:  Fair  Insight:  Fair  Psychomotor Activity:  Normal  Concentration:  Concentration: Fair  Recall:  [employer] of Knowledge: Fair  Language: Fair  Akathisia:  No  Handed:  Right  AIMS (if indicated):   Assets:  Communication Skills Desire for Improvement Social Support Talents/Skills  ADL's:  Intact  Cognition: WNL  Sleep:  Fair   Screenings: AIMS     Admission (Discharged) from OP Visit from 04/14/2019 in [facility]  AIMS Total Score  0    AUDIT     Admission (Discharged) from OP Visit from 04/14/2019 in [facility] [facility]  Alcohol Use Disorder Identification Test Final Score (AUDIT)  0    GAD-7     Office Visit from 04/23/2019 in [place] Office Visit from 03/12/2019 in [place] Office Visit from 03/10/2019 in [place] for [facility] Office Visit from 01/22/2017 in [place] for [facility] Office Visit from 01/09/2017 in [place]  Total GAD-7 Score  _0 PHQ2-9     Counselor from 05/10/2019 in [place] Office Visit from 04/23/2019 in [place] Office Visit from 03/12/2019 in [facility] Office Visit from 03/10/2019 in [place] for [facility] Office Visit from 01/22/2017 in [place] for [facility]  PHQ-2 Total Score  _0 PHQ-9 Total Score  _1 Assessment and Plan:  Continue partial hospitalization programming Continue medications as directed  Treatment plan was reviewed and agreed upon by NPT Kristen [name] and patient Kristen [name] need for continued group services   Derrill Center, NP 05/19/2019, 10:22 AM

## 2019-05-19 NOTE — Progress Notes (Addendum)
GROUP NOTE -  Spiritual care group 05/19/2019 11:00 - 12:00 ? Group met via web-ex due to COVID-19 precautions.  Group facilitated by Simone Curia, MDiv, BCC  ? ? Group focused on topic of strength. ?Group members reflected on what thoughts and feelings emerge when they hear this topic. Group engaged in reflective art activity around strength.?They then engaged in facilitated dialog around how strength is present in their lives. This dialog focused on representing what strength had been to them in their lives (images and patterns given) and what they saw as helpful in their life now (what they needed / wanted). ? ? Activity drew on narrative framework   Kristen [name] was present throughout group.  Noted connection to strength as "the ability to overcome.  She noted especially her care for her family - stating that she has cared for two disabled parents since she was young.  She framed strength for herself as "not comparing to others."  Spoke both about how her relationship with parents has affected life as well as dyslexia. She connected with the image of Kristen [name] - who had learning differences.

## 2019-05-20 ENCOUNTER — Other Ambulatory Visit: Payer: Self-pay

## 2019-05-20 ENCOUNTER — Other Ambulatory Visit (HOSPITAL_COMMUNITY): Payer: No Typology Code available for payment source

## 2019-05-20 ENCOUNTER — Other Ambulatory Visit (HOSPITAL_COMMUNITY): Payer: No Typology Code available for payment source | Admitting: Licensed Clinical Social Worker

## 2019-05-20 DIAGNOSIS — F419 Anxiety disorder, unspecified: Secondary | ICD-10-CM | POA: Diagnosis not present

## 2019-05-20 DIAGNOSIS — F332 Major depressive disorder, recurrent severe without psychotic features: Secondary | ICD-10-CM

## 2019-05-21 ENCOUNTER — Other Ambulatory Visit (HOSPITAL_COMMUNITY): Payer: No Typology Code available for payment source

## 2019-05-21 ENCOUNTER — Other Ambulatory Visit: Payer: Self-pay

## 2019-05-21 ENCOUNTER — Telehealth (HOSPITAL_COMMUNITY): Payer: Self-pay | Admitting: Licensed Clinical Social Worker

## 2019-05-24 ENCOUNTER — Other Ambulatory Visit (HOSPITAL_COMMUNITY): Payer: No Typology Code available for payment source | Admitting: Licensed Clinical Social Worker

## 2019-05-24 ENCOUNTER — Other Ambulatory Visit: Payer: Self-pay

## 2019-05-24 DIAGNOSIS — F332 Major depressive disorder, recurrent severe without psychotic features: Secondary | ICD-10-CM

## 2019-05-24 DIAGNOSIS — F419 Anxiety disorder, unspecified: Secondary | ICD-10-CM | POA: Diagnosis not present

## 2019-05-24 NOTE — Progress Notes (Signed)
Spoke with patient via Webex video call, used 2 identifiers to correctly identify patient. States that group is going well. Feels tired today but is sleeping well. No side effects from medications. On scale 1-10 as 10 being worst she rates depression at 7 and anxiety at 8. Denies SI/HI or AV hallucinations. No issues or complaints.

## 2019-05-25 ENCOUNTER — Other Ambulatory Visit (HOSPITAL_COMMUNITY): Payer: No Typology Code available for payment source | Admitting: Specialist

## 2019-05-25 ENCOUNTER — Encounter (HOSPITAL_COMMUNITY): Payer: Self-pay

## 2019-05-25 ENCOUNTER — Other Ambulatory Visit (HOSPITAL_COMMUNITY): Payer: No Typology Code available for payment source | Admitting: Licensed Clinical Social Worker

## 2019-05-25 ENCOUNTER — Other Ambulatory Visit: Payer: Self-pay

## 2019-05-25 DIAGNOSIS — F419 Anxiety disorder, unspecified: Secondary | ICD-10-CM | POA: Diagnosis not present

## 2019-05-25 DIAGNOSIS — F332 Major depressive disorder, recurrent severe without psychotic features: Secondary | ICD-10-CM

## 2019-05-25 DIAGNOSIS — F329 Major depressive disorder, single episode, unspecified: Secondary | ICD-10-CM

## 2019-05-25 DIAGNOSIS — R41844 Frontal lobe and executive function deficit: Secondary | ICD-10-CM

## 2019-05-25 DIAGNOSIS — R4589 Other symptoms and signs involving emotional state: Secondary | ICD-10-CM

## 2019-05-25 NOTE — Therapy (Signed)
Virtual Visit via Video Note  I connected with Kristen [name] on 05/25/19 at  11:00 AM EDT by a video enabled telemedicine application and verified that I am speaking with the correct person using two identifiers.   I discussed the limitations of evaluation and management by telemedicine and the availability of in person appointments. The patient expressed understanding and agreed to proceed.   I discussed the assessment and treatment plan with the patient. The patient was provided an opportunity to ask questions and all were answered. The patient agreed with the plan and demonstrated an understanding of the instructions.   The patient was advised to call back or seek an in-person evaluation if the symptoms worsen or if the condition fails to improve as anticipated.  I provided 60 minutes of non-face-to-face time during this encounter.   Kristen [name], MHA, OTR/L [phone]   [facility] HOSPITALIZATION PROGRAM [address] [place], [place], [address] Phone: [phone]   Fax:  [phone]  Occupational Therapy Treatment  Patient Details  Name: Kristen [name] MRN: [id number] Date of Birth: 04/10/1985 Referring Provider (OT): Hillery Jacks   Encounter Date: 05/25/2019  OT End of Session - 05/25/19 1208    Visit Number  4    Number of Visits  8    Date for OT Re-Evaluation  06/08/19    Authorization Type  no auth required    Progress Note Due on Visit  10    OT Start Time  1100    OT Stop Time  1200    OT Time Calculation (min)  60 min    Activity Tolerance  Patient tolerated treatment well    Behavior During Therapy  [facility] for tasks assessed/performed       Past Medical History:  Diagnosis Date  . Anxiety 2013   treated at [facility] by Deatra Robinson   . Asthma 1989    no hospitalization, or intubation, previously on advair and singulair. asthma worsening.   . 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  Depression 2001   depressed since childhood   . 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 Essential hypertension 03/12/2019  . Kyphosis of thoracic region 2004    painful, runs in dad side   . Migraine   . Pancreatitis 2003   gallstone pancreatitis   . Pancreatitis due to biliary obstruction 2003  . PCOS (polycystic ovarian syndrome) 2014   facial hair, irregular periods, never been pregnant     Past Surgical History:  Procedure Laterality Date  . CHOLECYSTECTOMY  2003   . OPEN REDUCTION INTERNAL FIXATION (ORIF) PROXIMAL PHALANX Right 07/23/2013   Procedure: OPEN TREATMENT RIGHT RING FINGER, PROXIMAL INTERPHALANGEAL/JOINT FRACTURE DISLOCATION;  Surgeon: Jodi Marble, MD;  Location: [facility];  Service: Orthopedics;  Laterality: Right;  . TONSILLECTOMY      There were no vitals filed for this visit.  Subjective Assessment - 05/25/19 1208    Currently in Pain?  No/denies         S:  I can control if I ask for help. O:  Group focus today was on realizing our circle of control in life in relation to worrying.  Group opened with members discussing why we worry and types of healthy and unhealthy worrying.  Healthy worrying is normal, can be caused by day to day issues, and is healthy when you identify if it something you can  control and take action on the issue.  Unhealthy worrying occurs when it is centered around something outside of your control or if it is within your control and you do not take action on the item.  Patient was able to identify examples of items that would fall within circle of control and outside of their circle of control.  Group then delved deeper into those items that were out of their control, and how they could reframe their thoughts, actions, etc to be in better control of the situation.  Group spent time exploring the following beliefs:  I cant control anyone else, but I can control my thoughts, words, choices, actions reactions, future.  I cant control  automatic thoughts, but I can control if they stay by recognizing them, disagreeing with them, disproving them, letting them go, and thinking positively.  I can't control my past mistakes but I can control my future by admitting mistakes, apologizing, forgiving others, trying again, and starting over.  Patient then worked to determine what has been a worry for them recently and if it was inside or outside of their circle of control.  Group closed with education on the four agreements to live by:  be impeccable with your word, don't take things personally, don't make assumptions, and always do your best.  Keeping these four agreements in mind can improve your freedom limiting beliefs and worry. A:  Kristen [name] was minimally engaged in session, answering if called on and not actively engaging.  Kristen [name] reflected that she can control if she asks for help or not and her self sabotaging thoughts by thinking I am enough versus I am not good enough.   P: Continue skilled OT intervention 2 times per week for 4 weeks, next session to focus on time management and organization.                   OT Education - 05/25/19 1208    Education Details  educated on strategies for managing worries    Person(s) Educated  Patient    Methods  Explanation    Comprehension  Verbalized understanding       OT Short Term Goals - 05/14/19 1349      OT SHORT TERM GOAL #1   Title  Pt will be educated on strategies to improve psychosocial skills needed to participate fully in all daily, work, and leisure activities.    Time  4    Period  Weeks    Status  On-going    Target Date  06/08/19      OT SHORT TERM GOAL #2   Title  Pt will apply psychosocial skills and coping mechanisms to daily activities in order to function independently and reintegrate into community    Time  4    Period  Weeks    Status  On-going      OT SHORT TERM GOAL #3   Title  Pt will engage in goal setting to improve functional  BADL/IADL routine upon reintegrating into community.    Time  4    Period  Weeks    Status  On-going      OT SHORT TERM GOAL #4   Title  Pt will recall and/or apply 1-3 sleep hygiene strategies to improve function in BADL routine upon reintegrating into community    Time  4    Period  Weeks    Status  On-going      OT SHORT TERM GOAL #5   Title  Pt will create and implement functional BADL/IADL routine upon reintegrating into the community for increased engagement in all daily, work, and leisure activities    Time  4    Period  Weeks    Status  New               Plan - 05/25/19 1209    Body Structure / Function / Physical Skills  ADL    Cognitive Skills  Attention;Emotional;Energy/Drive;Temperament/Personality;Thought;Understand    Psychosocial Skills  Coping Strategies;Environmental  Adaptations;Habits;Interpersonal Interaction;Routines and Behaviors       Patient will benefit from skilled therapeutic intervention in order to improve the following deficits and impairments:   Body Structure / Function / Physical Skills: ADL Cognitive Skills: Attention, Emotional, Energy/Drive, Temperament/Personality, Thought, Understand Psychosocial Skills: Coping Strategies, Environmental  Adaptations, Habits, Interpersonal Interaction, Routines and Behaviors   Visit Diagnosis: Difficulty coping  Frontal lobe and executive function deficit  Anxiety and depression    Problem List Patient Active Problem List   Diagnosis Date Noted  . 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 Abnormal serum thyroid stimulating hormone (TSH) level 04/23/2019  . 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 OSA on CPAP 03/17/2019  . Anxiety and depression 03/12/2019  . Chronic migraine with aura 03/12/2019  . Essential hypertension 03/12/2019  . Mild intermittent asthma without complication 70/96/2836  . 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 Elevated prolactin level 07/05/2016  . Vulvar lesion 07/05/2016  . Restless legs 05/24/2016  . Seasonal allergies 05/13/2014  . Intertrigo 05/13/2014  . Decreased vision 05/13/2014  . PCOS (polycystic ovarian syndrome) 02/07/2014  . Social phobia 02/02/2007  . ADD 02/02/2007  . 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 Asthma, severe persistent 02/02/2007  . SCOLIOSIS 02/02/2007  . OTHER SPECIFIED CONGENITAL ANOMALIES OF KIDNEY 02/02/2007  . ELECTROCARDIOGRAM, ABNORMAL 02/02/2007    Vangie Bicker, [place], OTR/L [phone]  05/25/2019, 12:09 PM  [place], [place], [address] Phone: [phone]   Fax:  [phone]  Name: Kristen [name] MRN: [id number] Date of Birth: December 05, 1985

## 2019-05-26 ENCOUNTER — Other Ambulatory Visit: Payer: Self-pay

## 2019-05-26 ENCOUNTER — Other Ambulatory Visit (HOSPITAL_COMMUNITY): Payer: No Typology Code available for payment source | Admitting: Licensed Clinical Social Worker

## 2019-05-26 DIAGNOSIS — F332 Major depressive disorder, recurrent severe without psychotic features: Secondary | ICD-10-CM

## 2019-05-26 DIAGNOSIS — F419 Anxiety disorder, unspecified: Secondary | ICD-10-CM | POA: Diagnosis not present

## 2019-05-26 NOTE — Progress Notes (Signed)
Spiritual care group 05/26/2019  11:00-12:00  Group met via web-ex due to COVID-19 precautions.  Group facilitated by Simone Curia, MDiv, BCC   Group focused on topic of "self-care"  Patients engaged in facilitated discussion about topic.  Explored quotes related to self care and chose one which they agreed with and one which they disliked.  Engaged in discussion around quote choices and their experience / understanding of care for themselves.

## 2019-05-27 ENCOUNTER — Telehealth (HOSPITAL_COMMUNITY): Payer: Self-pay | Admitting: Psychiatry

## 2019-05-27 ENCOUNTER — Other Ambulatory Visit: Payer: Self-pay

## 2019-05-27 ENCOUNTER — Other Ambulatory Visit (HOSPITAL_COMMUNITY): Payer: No Typology Code available for payment source

## 2019-05-27 ENCOUNTER — Other Ambulatory Visit (HOSPITAL_COMMUNITY): Payer: No Typology Code available for payment source | Admitting: Licensed Clinical Social Worker

## 2019-05-27 DIAGNOSIS — F419 Anxiety disorder, unspecified: Secondary | ICD-10-CM | POA: Diagnosis not present

## 2019-05-27 DIAGNOSIS — F332 Major depressive disorder, recurrent severe without psychotic features: Secondary | ICD-10-CM

## 2019-05-27 NOTE — Telephone Encounter (Signed)
D:  Pt will be transitioning from PHP to MH-IOP on 05-31-19.  A:  Placed call to orient pt and answer any questions, but there was no answer.  Left vm for patient to call case manager back.

## 2019-05-28 ENCOUNTER — Other Ambulatory Visit (HOSPITAL_COMMUNITY): Payer: No Typology Code available for payment source | Admitting: Licensed Clinical Social Worker

## 2019-05-28 ENCOUNTER — Other Ambulatory Visit: Payer: Self-pay

## 2019-05-28 ENCOUNTER — Encounter (HOSPITAL_COMMUNITY): Payer: Self-pay

## 2019-05-28 ENCOUNTER — Encounter (HOSPITAL_COMMUNITY): Payer: Self-pay | Admitting: Family

## 2019-05-28 ENCOUNTER — Other Ambulatory Visit (HOSPITAL_COMMUNITY): Payer: No Typology Code available for payment source | Admitting: Specialist

## 2019-05-28 DIAGNOSIS — R4589 Other symptoms and signs involving emotional state: Secondary | ICD-10-CM

## 2019-05-28 DIAGNOSIS — R41844 Frontal lobe and executive function deficit: Secondary | ICD-10-CM

## 2019-05-28 DIAGNOSIS — F332 Major depressive disorder, recurrent severe without psychotic features: Secondary | ICD-10-CM

## 2019-05-28 DIAGNOSIS — F419 Anxiety disorder, unspecified: Secondary | ICD-10-CM | POA: Diagnosis not present

## 2019-05-28 NOTE — Therapy (Signed)
Virtual Visit via Video Note  I connected with Kristen [name] on 05/28/19 at  1100 AM EDT by a video enabled telemedicine application and verified that I am speaking with the correct person using two identifiers.   I discussed the limitations of evaluation and management by telemedicine and the availability of in person appointments. The patient expressed understanding and agreed to proceed.    I discussed the assessment and treatment plan with the patient. The patient was provided an opportunity to ask questions and all were answered. The patient agreed with the plan and demonstrated an understanding of the instructions.   The patient was advised to call back or seek an in-person evaluation if the symptoms worsen or if the condition fails to improve as anticipated.  I provided 65 minutes of non-face-to-face time during this encounter.     [place], [place], [address] Phone: [phone]   Fax:  [phone]  Occupational Therapy Treatment  Patient Details  Name: Kristen [name] MRN: [id number] Date of Birth: 07-01-1985 Referring Provider (OT): Ricky Ala   Encounter Date: 05/28/2019  OT End of Session - 05/28/19 1623    Visit Number  5    Number of Visits  8    Date for OT Re-Evaluation  06/08/19    Authorization Type  no auth required    Progress Note Due on Visit  10    OT Start Time  1105    OT Stop Time  1210    OT Time Calculation (min)  65 min    Activity Tolerance  Patient tolerated treatment well    Behavior During Therapy  [facility] for tasks assessed/performed       Past Medical History:  Diagnosis Date  . Anxiety 2013   treated at [facility] by Pauline Good   . Asthma 1989    no hospitalization, or intubation, previously on advair and singulair. asthma worsening.   . 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 Depression 2001   depressed since childhood   .  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 Essential hypertension 03/12/2019  . Kyphosis of thoracic region 2004    painful, runs in dad side   . Migraine   . Pancreatitis 2003   gallstone pancreatitis   . Pancreatitis due to biliary obstruction 2003  . PCOS (polycystic ovarian syndrome) 2014   facial hair, irregular periods, never been pregnant     Past Surgical History:  Procedure Laterality Date  . CHOLECYSTECTOMY  2003   . OPEN REDUCTION INTERNAL FIXATION (ORIF) PROXIMAL PHALANX Right 07/23/2013   Procedure: OPEN TREATMENT RIGHT RING FINGER, PROXIMAL INTERPHALANGEAL/JOINT FRACTURE DISLOCATION;  Surgeon: Jolyn Nap, MD;  Location: [place];  Service: Orthopedics;  Laterality: Right;  . TONSILLECTOMY      There were no vitals filed for this visit.  Subjective Assessment - 05/28/19 1623    Pain Score  0-No pain              S:  Sometimes I am good at time management.  It depends on the situation. O:  Patient participated in skilled OT group focusing on time management this date.  Group consisted of warm up activity relating how you would spend $86,400 if you had it and couldn't have any left over at end of day - as we have 86,400 seconds in a day and need to  spend them wisely.  Group discussed if time can be managed vs activities in a day being managed.  Group was educated on effects of good time management ( improved focus, decision making, success, decreased stress, increased free time) as well as effects of inefficient time management ( inefficiency, increased stress, and poor quality work).  Group discussed common time thieves and possible solutions, and finished with education on tips to improve time management ( know how you currently spend time, set priorities, use a scheduling tool, schedule time appropriately, get organized, delegate, avoid procrastination, manage time wasters, avoid multitasking, stay healthy).  Group was educated on 10 simples ways to improve  organization at home:  leave keys and phone in one place, use a checklist, check bank balance on line, carry a notepad, declutter wallet, meal plan, lay clothing out the night before, prep for your next day, follow a routine, wash the dishes and make your bed.  Group concluded with members identifying one new time management strategy they plan to implement into daily routine.  A:  Aiyana was engaged in group session.  Savahna reflected that she would like to use a planner versus a to do list and likes the idea of washing the dishes daily instead of piling up.  She reflected on why dishes are her responsibility and not family members, as well.  OT directed patient back to assertive communication techniques and encouraged her to use a xyz message and share her frustration with her family member.  P: DC from skilled ot intervention this date.              OT Education - 05/28/19 1623    Education Details  educated on 10 time management tips    Person(s) Educated  Patient    Methods  Explanation    Comprehension  Verbalized understanding       OT Short Term Goals - 05/28/19 1624      OT SHORT TERM GOAL #1   Title  Pt will be educated on strategies to improve psychosocial skills needed to participate fully in all daily, work, and leisure activities.    Time  4    Period  Weeks    Status  Achieved    Target Date  06/08/19      OT SHORT TERM GOAL #2   Title  Pt will apply psychosocial skills and coping mechanisms to daily activities in order to function independently and reintegrate into community    Time  4    Period  Weeks    Status  Achieved      OT SHORT TERM GOAL #3   Title  Pt will engage in goal setting to improve functional BADL/IADL routine upon reintegrating into community.    Time  4    Period  Weeks    Status  Not Met      OT SHORT TERM GOAL #4   Title  Pt will recall and/or apply 1-3 sleep hygiene strategies to improve function in BADL routine upon  reintegrating into community    Time  4    Period  Weeks    Status  Achieved      OT SHORT TERM GOAL #5   Title  Pt will create and implement functional BADL/IADL routine upon reintegrating into the community for increased engagement in all daily, work, and leisure activities    Time  4    Period  Weeks    Status  Achieved  Plan - 05/28/19 1624    Body Structure / Function / Physical Skills  ADL    Cognitive Skills  Attention;Emotional;Energy/Drive;Temperament/Personality;Thought;Understand    Psychosocial Skills  Coping Strategies;Environmental  Adaptations;Habits;Interpersonal Interaction;Routines and Behaviors       Patient will benefit from skilled therapeutic intervention in order to improve the following deficits and impairments:   Body Structure / Function / Physical Skills: ADL Cognitive Skills: Attention, Emotional, Energy/Drive, Temperament/Personality, Thought, Understand Psychosocial Skills: Coping Strategies, Environmental  Adaptations, Habits, Interpersonal Interaction, Routines and Behaviors   Visit Diagnosis: Difficulty coping  Frontal lobe and executive function deficit    Problem List Patient Active Problem List   Diagnosis Date Noted  . 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 Abnormal serum thyroid stimulating hormone (TSH) level 04/23/2019  . MDD (major depressive disorder), recurrent severe, without psychosis ([place]) 04/15/2019  . OSA on CPAP 03/17/2019  . Anxiety and depression 03/12/2019  . Chronic migraine with aura 03/12/2019  . Essential hypertension 03/12/2019  . Mild intermittent asthma without complication 00/44/7158  . 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 Elevated prolactin level 07/05/2016  . Vulvar lesion 07/05/2016  . Restless legs 05/24/2016  . Seasonal allergies 05/13/2014  . Intertrigo 05/13/2014  . Decreased vision 05/13/2014  . PCOS (polycystic ovarian syndrome) 02/07/2014  . Social  phobia 02/02/2007  . ADD 02/02/2007  . 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 Asthma, severe persistent 02/02/2007  . SCOLIOSIS 02/02/2007  . OTHER SPECIFIED CONGENITAL ANOMALIES OF KIDNEY 02/02/2007  . ELECTROCARDIOGRAM, ABNORMAL 02/02/2007    Vangie Bicker, MHA, OTR/L [phone] OCCUPATIONAL THERAPY DISCHARGE SUMMARY  Visits from Start of Care: 5  Current functional level related to goals / functional outcomes: See above    Remaining deficits: Goal setting    Education / Equipment: See above  Plan: Patient agrees to discharge.  Patient goals were partially met. Patient is being discharged due to meeting the stated rehab goals.  ?????  Vangie Bicker, [place], OTR/L [phone]     05/28/2019, 4:27 PM  [place], [place], [address] Phone: [phone]   Fax:  [phone]  Name: SEVERA JEREMIAH MRN: [id number] Date of Birth: 16-Aug-1985

## 2019-05-28 NOTE — Progress Notes (Signed)
BH MD/PA/NP OP Progress Note  05/28/2019 11:28 AM Kristen [name]  MRN:  [id number]  Chief Complaint:  Colletta Maryland reported "  I am feeling blah today."   Evaluation: Chanice was seen and evaluated via teleassessment.  She presents flat, guarded but pleasant.  Denying suicidal or homicidal ideations.  Denies auditory or visual hallucinations.  Reports feeling  just okay today. "  I am not up not down just existing.  Reports restless night and slightly irritable.  Discussed increasing trazodone 50 mg to 100 mg p.o. as needed as needed.  Patient was receptive to plan.  States she continues to work on distraction techniques to assist when she is having unhealthy thoughts.  Patient to continue partial hospitalization programming.  Reports a good appetite.  Kirstein rates her depression 5 out of 10 with 10 being the worst.  Reports taking and tolerating medications well.  Support, encouragement and reassurance was provided.  Visit Diagnosis: No diagnosis found.  Past Psychiatric History:   Past Medical History:  Past Medical History:  Diagnosis Date  . Anxiety 2013   treated at [facility] by Pauline Good   . Asthma 1989    no hospitalization, or intubation, previously on advair and singulair. asthma worsening.   . 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 Depression 2001   depressed since childhood   . 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 Essential hypertension 03/12/2019  . Kyphosis of thoracic region 2004    painful, runs in dad side   . Migraine   . Pancreatitis 2003   gallstone pancreatitis   . Pancreatitis due to biliary obstruction 2003  . PCOS (polycystic ovarian syndrome) 2014   facial hair, irregular periods, never been pregnant     Past Surgical History:  Procedure Laterality Date  . CHOLECYSTECTOMY  2003   . OPEN REDUCTION INTERNAL FIXATION (ORIF) PROXIMAL PHALANX Right 07/23/2013   Procedure: OPEN TREATMENT RIGHT RING FINGER, PROXIMAL INTERPHALANGEAL/JOINT FRACTURE  DISLOCATION;  Surgeon: Jolyn Nap, MD;  Location: [place];  Service: Orthopedics;  Laterality: Right;  . TONSILLECTOMY      Family Psychiatric History:   Family History:  Family History  Problem Relation Age of Onset  . Hypertension Mother   . Arnold-Chiari malformation Mother   . Restless legs syndrome Mother   . Osteoporosis Mother   . COPD Mother   . Asthma Mother   . Squamous cell carcinoma Mother        skin   . Eczema Mother   . Arthritis Mother   . Peripheral Artery Disease Mother   . Anxiety disorder Mother   . OCD Mother   . Hypertension Father   . Scoliosis Father   . Bipolar disorder Father   . Congestive Heart Failure Father   . COPD Father   . Depression Father   . Diabetes Maternal Grandmother   . Hypertension Maternal Grandmother   . Dementia Maternal Grandmother   . OCD Maternal Grandmother   . Cancer Paternal Grandmother        colon  . Bone cancer Maternal Grandfather 82       bone marrow   . Multiple myeloma Maternal Grandfather   . Alcohol abuse Maternal Grandfather   . Drug abuse Maternal Grandfather   . Diabetes Paternal Grandfather   . Alcohol abuse Paternal Grandfather   . Drug abuse Paternal Grandfather   . Dementia Paternal Grandfather   . Alcohol abuse  Maternal Aunt   . Depression Maternal Aunt   . Alcohol abuse Paternal Aunt   . Depression Paternal Aunt   . ADD / ADHD Cousin     Social History:  Social History   Socioeconomic History  . Marital status: Single    Spouse name: Not on file  . Number of children: 0  . Years of education: college   . Highest education level: Not on file  Occupational History  . Occupation: Unemployed   Tobacco Use  . Smoking status: Never Smoker  . Smokeless tobacco: Never Used  Substance and Sexual Activity  . Alcohol use: No  . Drug use: No  . Sexual activity: Yes    Birth control/protection: None  Other Topics Concern  . Not on file  Social History Narrative    Lives with mom [name])    Right-handed   Caffeine: 3 cups of coffee per day   Social Determinants of Health   Financial Resource Strain:   . Difficulty of Paying Living Expenses:   Food Insecurity:   . Worried About Charity fundraiser in the Last Year:   . Arboriculturist in the Last Year:   Transportation Needs:   . Film/video editor (Medical):   Marland Kitchen Lack of Transportation (Non-Medical):   Physical Activity:   . Days of Exercise per Week:   . Minutes of Exercise per Session:   Stress:   . Feeling of Stress :   Social Connections:   . Frequency of Communication with Friends and Family:   . Frequency of Social Gatherings with Friends and Family:   . Attends Religious Services:   . Active Member of Clubs or Organizations:   . Attends Archivist Meetings:   Marland Kitchen Marital Status:     Allergies:  Allergies  Allergen Reactions  . Mucinex [Guaifenesin Er] Hives, Itching and Swelling  . Penicillins Swelling    Did it involve swelling of the face/tongue/throat, SOB, or low BP? Yes Did it involve sudden or severe rash/hives, skin peeling, or any reaction on the inside of your mouth or nose? No Did you need to seek medical attention at a hospital or doctor's office? Yes When did it last happen?>10 years ago If all above answers are "NO", may proceed with cephalosporin use.   . Contrast Media [Iodinated Diagnostic Agents] Nausea And Vomiting  . Multihance [Gadobenate] Nausea And Vomiting    Metabolic Disorder Labs: Lab Results  Component Value Date   HGBA1C 4.9 04/15/2019   MPG 93.93 04/15/2019   Lab Results  Component Value Date   PROLACTIN 15.9 09/18/2016   PROLACTIN 32.9 (H) 06/20/2016   Lab Results  Component Value Date   CHOL 204 (H) 04/15/2019   TRIG 146 04/15/2019   HDL 41 04/15/2019   CHOLHDL 5.0 04/15/2019   VLDL 29 04/15/2019   LDLCALC 134 (H) 04/15/2019   Lab Results  Component Value Date   TSH 4.628 (H) 04/15/2019   TSH 2.140  07/22/2017    Therapeutic Level Labs: No results found for: LITHIUM No results found for: VALPROATE No components found for:  CBMZ  Current Medications: Current Outpatient Medications  Medication Sig Dispense Refill  . albuterol (VENTOLIN HFA) 108 (90 Base) MCG/ACT inhaler Inhale 2 puffs into the lungs every 6 (six) hours as needed for wheezing or shortness of breath. 6.7 g 3  . busPIRone (BUSPAR) 10 MG tablet Take 1 tablet (10 mg total) by mouth 2 (two) times daily. For anxiety  60 tablet 0  . hydrOXYzine (ATARAX/VISTARIL) 25 MG tablet Take 1 tablet (25 mg total) by mouth 3 (three) times daily as needed for anxiety. 75 tablet 0  . naproxen (NAPROSYN) 500 MG tablet Take 1 tablet (500 mg total) by mouth 2 (two) times daily with a meal. (May buy from over the counter): For pain    . propranolol (INDERAL) 20 MG tablet Take 1 tablet (20 mg total) by mouth 2 (two) times daily. For anxiety 60 tablet 0  . sertraline (ZOLOFT) 50 MG tablet Take 1 tablet (50 mg total) by mouth daily. For depression 30 tablet 0  . traZODone (DESYREL) 50 MG tablet Take 1 tablet (50 mg total) by mouth at bedtime as needed for sleep. 30 tablet 0   No current facility-administered medications for this visit.     Musculoskeletal:   Psychiatric Specialty Exam: Review of Systems  There were no vitals taken for this visit.There is no height or weight on file to calculate BMI.  General Appearance: Casual and Guarded  Eye Contact:  Good  Speech:  Clear and Coherent  Volume:  Normal  Mood:  Anxious and Depressed  Affect:  Congruent  Thought Process:  Coherent  Orientation:  Full (Time, Place, and Person)  Thought Content: Logical   Suicidal Thoughts:  No  Homicidal Thoughts:  No  Memory:  Immediate;   Fair Recent;   Fair  Judgement:  Fair  Insight:  Fair  Psychomotor Activity:  Normal  Concentration:  Concentration: Fair  Recall:  [employer] of Knowledge: Fair  Language: Fair  Akathisia:  No  Handed:   Right  AIMS (if indicated):   Assets:  Communication Skills Desire for Improvement Resilience Social Support  ADL's:  Intact  Cognition: WNL  Sleep:  Fair   Screenings: AIMS     Admission (Discharged) from OP Visit from 04/14/2019 in [place] [facility]  AIMS Total Score  0    AUDIT     Admission (Discharged) from OP Visit from 04/14/2019 in [place] [facility]  Alcohol Use Disorder Identification Test Final Score (AUDIT)  0    GAD-7     Office Visit from 04/23/2019 in [place] Office Visit from 03/12/2019 in [place] Office Visit from 03/10/2019 in [place] for [facility] Office Visit from 01/22/2017 in [place] for [facility] Office Visit from 01/09/2017 in [place]  Total GAD-7 Score  21  18  15  4  2     PHQ2-9     Counselor from 05/10/2019 in [place] Office Visit from 04/23/2019 in [place] Office Visit from 03/12/2019 in [place] Office Visit from 03/10/2019 in [place] for [facility] Office Visit from 01/22/2017 in [place] for [facility]  PHQ-2 Total Score  6  6  6  6  4   PHQ-9 Total Score  22  19  21  21  12        Assessment and Plan:  Patient to continue partial hospitalization programming Discussed titrating trazodone 50 mg to 100 mg p.o. nightly as needed for reported sleep concerns  Treatment plan was reviewed and agreed upon by NP Myrle Sheng and patient Taleigha Pinson need for continued group services   Derrill Center, NP 05/28/2019, 11:28 AM

## 2019-05-29 NOTE — Psych (Signed)
Virtual Visit via Video Note  I connected with [name] on 05/18/19 at  9:00 AM EDT by a video enabled telemedicine application and verified that I am speaking with the correct person using two identifiers.   I discussed the limitations of evaluation and management by telemedicine and the availability of in person appointments. The patient expressed understanding and agreed to proceed.  I discussed the assessment and treatment plan with the patient. The patient was provided an opportunity to ask questions and all were answered. The patient agreed with the plan and demonstrated an understanding of the instructions.   The patient was advised to call back or seek an in-person evaluation if the symptoms worsen or if the condition fails to improve as anticipated.  Pt was provided 240 minutes of non-face-to-face time during this encounter.   [name], LCSW    [facility] PHP THERAPIST PROGRESS NOTE  AASHA DINA [id number]  Session Time: 9:00 - 10:00  Participation Level: Active  Behavioral Response: CasualAlertDepressed  Type of Therapy: Group Therapy  Treatment Goals addressed: Coping  Interventions: CBT, DBT, Supportive and Reframing  Summary: Clinician led check-in regarding current stressors and situation, and review of patient completed daily inventory. Clinician utilized active listening and empathetic response and validated patient emotions. Clinician facilitated processing group on pertinent issues.   Therapist Response:Mailee Jeannie Fend Moroz is a 34 y.o. female who presents with depression and anxiety symptoms. Patient arrived within time allowed and reports that she is feeling "pretty good." Patient rates hermood at [facility] a scale of 1-10 with 10 being great. Pt reports she has been struggling with allergies which has decreased her energy. Pt states she spent yesterday gardening and journaling. Pt reports struggling with focusing on complicated tasks. Pt is able  to process. Patient engaged in discussion.      Session Time: 10:00-11:00  Participation Level:Active  Behavioral Response:CasualAlertDepressed  Type of Therapy:Group Therapy  Treatment Goals addressed: Coping  Interventions:CBT, DBT, Solution Focused, Supportive and Reframing  Summary:Cln led discussion on reframing our perspective. Group members processed struggles with issues that felt unmovable and feeling "stuck." Cln utilized CBT to inform discussion on changing perspective tho decrease emotional activation. Group members worked to reframe struggles that were shared to decrease stress.  Therapist Response:  Pt engaged in discussion and reports understanding of reframing and its benefits. Pt participated in group's reframing efforts for one another's struggles.       Session Time: 11:00- 12:00  Participation Level:Active  Behavioral Response:CasualAlertDepressed  Type of Therapy: Group Therapy, OT  Treatment Goals addressed: Coping  Interventions:Psychosocial skills training, Supportive  Summary:Occupational Therapy group  Therapist Response:Patient engaged in group. See OT note.       Session Time: 12:00 -1:00  Participation Level:Active  Behavioral Response:CasualAlertDepressed  Type of Therapy:Group therapy  Treatment Goals addressed: Coping  Interventions:CBT; Solution focused; Supportive; Reframing  Summary:12:00 - 12:50: Cln continued topic of distress tolerance skills. Cln introduced the distraction ACCEPTS skills. Cln discussed the ways in which distraction can aid in coping as well as the role and limits it can play. Group discussed A-C-C-E skills and how they can utilize them in their current life.  12:50 -1:00 Clinician led check-out. Clinician assessed for immediate needs, medication compliance and efficacy, and safety concerns  Therapist Response:12:00 - 12:50:Pt engaged in discussion  and reports understanding of skills discussed. Pt reports she can utilize painting, drawing, and YouTube mysteries to practice. 12:50 - 1:00: At check-out, patientrates hermood at a 7on a scale  of 1-10 with 10 being great.Pt states afternoon plans of calling her insurance company and listening to comedy. Patient demonstrates someprogress as evidenced byincreased practice of positive activities.Patient denies SI/HI/self-harm at the end of group.    Suicidal/Homicidal: Nowithout intent/plan  Plan: Pt will continue in PHP while working to decrease depression and anxiety symptoms, increase ability to manage symptoms in a healthy manner, and increase daily functioning.   Diagnosis: MDD (major depressive disorder), recurrent severe, without psychosis ([place]) [F33.2]    1. MDD (major depressive disorder), recurrent severe, without psychosis ([place])       Lorin Glass, LCSW 05/29/2019

## 2019-05-29 NOTE — Psych (Signed)
Virtual Visit via Video Note  I connected with Kristen [name] on 05/11/19 at  9:00 AM EDT by a video enabled telemedicine application and verified that I am speaking with the correct person using two identifiers.   I discussed the limitations of evaluation and management by telemedicine and the availability of in person appointments. The patient expressed understanding and agreed to proceed.  I discussed the assessment and treatment plan with the patient. The patient was provided an opportunity to ask questions and all were answered. The patient agreed with the plan and demonstrated an understanding of the instructions.   The patient was advised to call back or seek an in-person evaluation if the symptoms worsen or if the condition fails to improve as anticipated.  Pt was provided 240 minutes of non-face-to-face time during this encounter.   [name], LCSW    [facility] PHP THERAPIST PROGRESS NOTE  Kristen [name] [id number]  Session Time: 9:00 - 10:00  Participation Level: Active  Behavioral Response: CasualAlertDepressed  Type of Therapy: Group Therapy  Treatment Goals addressed: Coping  Interventions: CBT, DBT, Supportive and Reframing  Summary: Clinician led check-in regarding current stressors and situation, and review of patient completed daily inventory. Clinician utilized active listening and empathetic response and validated patient emotions. Clinician facilitated processing group on pertinent issues.   Therapist Response:Kristen [name] is a 34 y.o. female who presents with depression and anxiety symptoms. Patient arrived within time allowed and reports that she is feeling "frustrated." Patient rates hermood at [facility] a scale of 1-10 with 10 being great. Pt reports she took her parents to appointments and worked on paperwork yesterday. Pt states she became overwhelmed and had difficulty managing the anxiety. Pt reports struggling with coping because she feels  guilty for doing things for herself. Pt is able to process. Patient engaged in discussion.      Session Time: 10:00-11:00  Participation Level:Active  Behavioral Response:CasualAlertDepressed  Type of Therapy:Group Therapy  Treatment Goals addressed: Coping  Interventions:CBT, DBT, Solution Focused, Supportive and Reframing  Summary:Cln led discussion on emotional regulation. Cln discussed how awareness of triggers and warning signs can aid in increasing ability to manage big emotions by catching them early. Cln encouraged pt's to utilize distraction skills as soon as they recognize a feeling is problematic for them.   Therapist Response:  Pt engaged in discussion and reports struggling with awareness of triggers. Pt is able to process and identify one way to work on their emotional regulation.       Session Time: 11:00- 12:00  Participation Level:Active  Behavioral Response:CasualAlertDepressed  Type of Therapy: Group Therapy, OT  Treatment Goals addressed: Coping  Interventions:Psychosocial skills training, Supportive  Summary:Occupational Therapy group  Therapist Response:Patient engaged in group. See OT note.       Session Time: 12:00 -1:00  Participation Level:Active  Behavioral Response:CasualAlertDepressed  Type of Therapy:Group therapy  Treatment Goals addressed: Coping  Interventions:CBT; Solution focused; Supportive; Reframing  Summary:12:00 - 12:50: Cln introduced topic of healthy relationships. Group created list of traits they felt are important in a healthy relationships and discussed why they valued those traits. Cln presented honesty, trust, and respect as the foundation to a healthy relationship. Group members shared ways in which their current relationships stack up. Cln encouraged group members to create their own list of the most important traits and to seek to exhibit those traits in  order to attract them in others.  12:50 -1:00 Clinician led check-out. Clinician assessed for immediate needs,  medication compliance and efficacy, and safety concerns  Therapist Response:12:00 - 12:50:Pt engaged in discussion and activity. Pt reports her relationships are moderately  healthy and is able to identify ways to improve them. 12:50 - 1:00: At check-out, patientrates hermood at a 6on a scale of 1-10 with 10 being great.Pt states afternoon plans of making phone calls and doodling. Patient demonstrates someprogress as evidenced byimproved sleep.Patient denies SI/HI/self-harm at the end of group.    Suicidal/Homicidal: Nowithout intent/plan  Plan: Pt will continue in PHP while working to decrease depression and anxiety symptoms, increase ability to manage symptoms in a healthy manner, and increase daily functioning.   Diagnosis: Severe episode of recurrent major depressive disorder, without psychotic features (Kristen [name]) [F33.2]    1. Severe episode of recurrent major depressive disorder, without psychotic features (Kristen [name])       Lorin Glass, LCSW 05/29/2019

## 2019-05-29 NOTE — Psych (Signed)
Virtual Visit via Video Note  I connected with Waynette Buttery on 05/17/19 at  9:00 AM EDT by a video enabled telemedicine application and verified that I am speaking with the correct person using two identifiers.   I discussed the limitations of evaluation and management by telemedicine and the availability of in person appointments. The patient expressed understanding and agreed to proceed.  I discussed the assessment and treatment plan with the patient. The patient was provided an opportunity to ask questions and all were answered. The patient agreed with the plan and demonstrated an understanding of the instructions.   The patient was advised to call back or seek an in-person evaluation if the symptoms worsen or if the condition fails to improve as anticipated.  Pt was provided 240 minutes of non-face-to-face time during this encounter.   Lorin Glass, LCSW    [facility] PHP THERAPIST PROGRESS NOTE  TERIYAH PURINGTON [id number]  Session Time: 9:00 - 10:00  Participation Level: Active  Behavioral Response: CasualAlertDepressed  Type of Therapy: Group Therapy  Treatment Goals addressed: Coping  Interventions: CBT, DBT, Supportive and Reframing  Summary: Clinician led check-in regarding current stressors and situation, and review of patient completed daily inventory. Clinician utilized active listening and empathetic response and validated patient emotions. Clinician facilitated processing group on pertinent issues.   Therapist Response:Joyce Darreld Mclean Kozakiewicz is a 34 y.o. female who presents with depression and anxiety symptoms. Patient arrived within time allowed and reports that she is feeling "groggy." Patient rates hermood at [facility] a scale of 1-10 with 10 being great. Pt reports she spent the weekend gardening. Pt states she has not been sleeping well and is tired. Pt reports struggling with mood stability. Pt is able to process. Patient engaged in  discussion.      Session Time: 10:00-11:00  Participation Level:Active  Behavioral Response:CasualAlertDepressed  Type of Therapy:Group Therapy  Treatment Goals addressed: Coping  Interventions:CBT, DBT, Solution Focused, Supportive and Reframing  Summary:Cln continued topic of feelings. Group utilized handout "Myths about emotions" to reinforce elements of feelings already discussed. Group shared ways in which the myths feel true and worked together to reframe the myth from a healthier standpoint.    Therapist Response: Pt engaged in discussion and activity. Pt demonstrates understanding of feelings and emotions.        Session Time: 11:00- 12:00  Participation Level:Active  Behavioral Response:CasualAlertDepressed  Type of Therapy:Group Therapy  Treatment Goals addressed: Coping  Interventions:CBT, DBT, Solution Focused, Supportive and Reframing  Summary:Cln introduced topic of distress tolerance skills. Cln discussed the purpose of distress tolerance skills, how to utilize them, and how they can help [place] in our recovery. Cln introduced the DBT STOP skill and how to practice it. Group members discussed how they could apply this skill in their own lives.  Therapist Response:  Pt engaged in discussion and reports understanding of distress tolerance skills. Pt reports she can use the STOP skill when ruminating.        Session Time: 12:00 -1:00  Participation Level:Active  Behavioral Response:CasualAlertDepressed  Type of Therapy:Group therapy  Treatment Goals addressed: Coping  Interventions:CBT; Solution focused; Supportive; Reframing  Summary:12:00 - 12:50: Cln continued topic of distress tolerance skills. Cln introduced the DBT TIP skill to help manage extreme emotions. Group discussed how they can utilize the TIP skill in their everyday life.  12:50 -1:00 Clinician led check-out. Clinician assessed  for immediate needs, medication compliance and efficacy, and safety concerns  Therapist Response:12:00 - 12:50: Pt engaged  in discussion and reports understanding of the TIP skill. Pt reports she is most likely to practice paced breathing.   12:50 - 1:00: At check-out, patientrates hermood at a 8on a scale of 1-10 with 10 being great.Pt states afternoon plans of journaling. Patient demonstrates someprogress as evidenced byimproved mood throughout group.Patient denies SI/HI/self-harm at the end of group.    Suicidal/Homicidal: Nowithout intent/plan  Plan: Pt will continue in PHP while working to decrease depression and anxiety symptoms, increase ability to manage symptoms in a healthy manner, and increase daily functioning.   Diagnosis: Severe episode of recurrent major depressive disorder, without psychotic features (HCC) [F33.2]    1. Severe episode of recurrent major depressive disorder, without psychotic features (HCC)       [name], LCSW 05/29/2019

## 2019-05-29 NOTE — Psych (Signed)
Virtual Visit via Video Note  I connected with Waynette Buttery on 05/13/19 at  9:00 AM EDT by a video enabled telemedicine application and verified that I am speaking with the correct person using two identifiers.   I discussed the limitations of evaluation and management by telemedicine and the availability of in person appointments. The patient expressed understanding and agreed to proceed.  I discussed the assessment and treatment plan with the patient. The patient was provided an opportunity to ask questions and all were answered. The patient agreed with the plan and demonstrated an understanding of the instructions.   The patient was advised to call back or seek an in-person evaluation if the symptoms worsen or if the condition fails to improve as anticipated.  Pt was provided 240 minutes of non-face-to-face time during this encounter.   Lorin Glass, LCSW    [facility] PHP THERAPIST PROGRESS NOTE  DURGA SALDARRIAGA [id number]  Session Time: 9:00 - 10:00  Participation Level: Active  Behavioral Response: CasualAlertDepressed  Type of Therapy: Group Therapy  Treatment Goals addressed: Coping  Interventions: CBT, DBT, Supportive and Reframing  Summary: Clinician led check-in regarding current stressors and situation, and review of patient completed daily inventory. Clinician utilized active listening and empathetic response and validated patient emotions. Clinician facilitated processing group on pertinent issues.   Therapist Response:Vernette Darreld Mclean Blitzer is a 34 y.o. female who presents with depression and anxiety symptoms. Patient arrived within time allowed and reports that she is feeling "okay." Patient rates hermood at a3on a scale of 1-10 with 10 being great. Pt reports experiencing another panic attack yesterday due to work stress. Pt states she will have to leave her job due to her doctor recommending she do part-time work. Ptreports she did not manage the stress well  and had SI with no intent. Pt states she made efforts to distract by getting tea with her fiance, using social media, and doodling. Pt reports struggling with being overwhelmed by emotions. Pt is able to process. Patient engaged in discussion.      Session Time: 10:00-11:00  Participation Level:Active  Behavioral Response:CasualAlertDepressed  Type of Therapy:Group Therapy  Treatment Goals addressed: Coping  Interventions:CBT, DBT, Solution Focused, Supportive and Reframing  Summary:Cln introduced topic of feelings. Cln separated feelings and our reactions to feelings and discussed the differences between the two. Group worked to decipher situations to determine whether feelings or reactions are at play and how knowing the difference can reframe our perceptions and motivations.    Therapist Response: Pt engaged in discussion. Pt reports struggling with separating feelings versus the reaction to the feeling due to the strength of the feelings.        Session Time: 11:00- 12:00  Participation Level:Active  Behavioral Response:CasualAlertDepressed  Type of Therapy:Group Therapy  Treatment Goals addressed: Coping  Interventions:CBT, DBT, Solution Focused, Supportive and Reframing  Summary:Cln continued topic of feelings and introduced concept of emotional reasoning. Cln discussed the need to determine whether a feeling is a founded or unfounded feeling. Group worked through examples to Network engineer determining if a feeling is founded or unfounded and how to respond in either scenario.  Therapist Response:  Pt engaged in discussion and activity. Pt reports understanding of feelings not equaling fact and demonstrates this in practice.        Session Time: 12:00 -1:00  Participation Level:Active  Behavioral Response:CasualAlertDepressed  Type of Therapy:Group therapy  Treatment Goals addressed:  Coping  Interventions:CBT; Solution focused; Supportive; Reframing  Summary:12:00 - 12:50: Cln  continued topic of feelings. Group viewed TED talk "the power of vulnerability" and discussed ways in which feelings typically viewed as "negative" such as guilt, shame, and vulnerability can serve important purposes. Group discussed ways in which vulnerability is difficult for them.  12:50 -1:00 Clinician led check-out. Clinician assessed for immediate needs, medication compliance and efficacy, and safety concerns  Therapist Response:12:00 - 12:50: Pt engaged in discussion and reports vulnerability is difficult for her due to struggles with self-esteem.  12:50 - 1:00: At check-out, patientrates hermood at a 4.5on a scale of 1-10 with 10 being great.Pt states afternoon plans of taking her mom to an appointment. Patient demonstrates someprogress as evidenced byincreased efforts to distract.Patient denies SI/HI/self-harm at the end of group.    Suicidal/Homicidal: Nowithout intent/plan  Plan: Pt will continue in PHP while working to decrease depression and anxiety symptoms, increase ability to manage symptoms in a healthy manner, and increase daily functioning.   Diagnosis: Severe episode of recurrent major depressive disorder, without psychotic features (HCC) [F33.2]    1. Severe episode of recurrent major depressive disorder, without psychotic features (HCC)       [name], LCSW 05/29/2019

## 2019-05-29 NOTE — Psych (Signed)
Virtual Visit via Video [name]  I connected with Kristen [name] on 05/19/19 at  9:00 AM EDT by a video enabled telemedicine application and verified that I am speaking with the correct person using two identifiers.   I discussed the limitations of evaluation and management by telemedicine and the availability of in person appointments. The patient expressed understanding and agreed to proceed.  I discussed the assessment and treatment plan with the patient. The patient was provided an opportunity to ask questions and all were answered. The patient agreed with the plan and demonstrated an understanding of the instructions.   The patient was advised to call back or seek an in-person evaluation if the symptoms worsen or if the condition fails to improve as anticipated.  Pt was provided 240 minutes of non-face-to-face time during this encounter.   Kristen Guiles, LCSW    Kristen [name]  Kristen [name] [id number]  Session Time: 9:00 - 10:00  Participation Level: Active  Behavioral Response: CasualAlertDepressed  Type of Therapy: Group Therapy  Treatment Goals addressed: Coping  Interventions: CBT, DBT, Supportive and Reframing  Summary: Clinician led check-in regarding current stressors and situation, and review of patient completed daily inventory. Clinician utilized active listening and empathetic response and validated patient emotions. Clinician facilitated processing group on pertinent issues.   Therapist Response:Kristen [name] is a 34 y.o. female who presents with depression and anxiety symptoms. Patient arrived within time allowed and reports that she is feeling "not so good" Patient rates hermood at [facility] a scale of 1-10 with 10 being great. Pt reports has a headache and is making her feel "blah." Pt reports she stayed around the house yesterday and journaled. Pt reports struggling with mood dependent thinking. Pt is able to process. Patient  engaged in discussion.      Session Time: 10:00 -11:00  Participation Level:Active  Behavioral Response:CasualAlertDepressed  Type of Therapy: Group Therapy, psychoeducation, psychotherapy  Treatment Goals addressed: Coping  Interventions:CBT, DBT, Solution Focused, Supportive and Reframing  Summary:Cln led discussion on the importance of allowing ourselves to be works in progress. Cln brought reframing in to highlight how changing perspective can increase willingness to extend grace and kindness to ourselves. Group members processed struggles they have with offering kindness to themselves and offered each other support.   Therapist Response: Pt engaged in discussion and reports struggling with being kind to oneself amidst their recovery journey. Pt is able to process and offer and receive support.       Session Time: 11:00 -12:00  Participation Level:Active  Behavioral Response:CasualAlertDepressed  Type of Therapy: Group Therapy, psychotherapy  Treatment Goals addressed: Coping  Interventions:Strengths based, reframing, Supportive,   Summary:Spiritual Care group  Therapist Response: Patient engaged in group. See chaplain [name].        Session Time: 12:00- 1:00  Participation Level:Active  Behavioral Response:CasualAlertDepressed  Type of Therapy: Group Therapy, Psychoeducation  Treatment Goals addressed: Coping  Interventions:relaxation training; Supportive; Reframing  Summary:12:00 - 12:50: Relaxation group: Cln led group focused on retraining the body's response to stress.  12:50 -1:00 Clinician led check-out. Clinician assessed for immediate needs, medication compliance and efficacy, and safety concerns  Therapist Response:Pt engaged in activity and discussion.  12:50 - 1:00: At check-out, patientrates hermood at a 6on a scale of 1-10 with 10 being great.Pt states afternoon plans of cleaning.  Patient demonstrates someprogress as evidenced byincreased willingness to spend time on herself.Patient denies SI/HI/self-harm at the end of group.    Suicidal/Homicidal: Nowithout  intent/plan  Plan: Pt will continue in PHP while working to decrease depression and anxiety symptoms, increase ability to manage symptoms in a healthy manner, and increase daily functioning.   Diagnosis: Severe episode of recurrent major depressive disorder, without psychotic features ([place]) [F33.2]    1. Severe episode of recurrent major depressive disorder, without psychotic features ([place])       Lorin Glass, LCSW 05/29/2019

## 2019-05-29 NOTE — Psych (Signed)
Virtual Visit via Video Note  I connected with Kristen [name] on 05/10/19 at  9:00 AM EDT by a video enabled telemedicine application and verified that I am speaking with the correct person using two identifiers.   I discussed the limitations of evaluation and management by telemedicine and the availability of in person appointments. The patient expressed understanding and agreed to proceed.  I discussed the assessment and treatment plan with the patient. The patient was provided an opportunity to ask questions and all were answered. The patient agreed with the plan and demonstrated an understanding of the instructions.   The patient was advised to call back or seek an in-person evaluation if the symptoms worsen or if the condition fails to improve as anticipated.  Pt was provided 240 minutes of non-face-to-face time during this encounter.   Lorin Glass, LCSW    [facility] PHP THERAPIST PROGRESS NOTE  Kristen [name] [id number]  Session Time: 9:00 - 10:00  Participation Level: Active  Behavioral Response: CasualAlertDepressed  Type of Therapy: Group Therapy  Treatment Goals addressed: Coping  Interventions: CBT, DBT, Supportive and Reframing  Summary: Clinician led check-in regarding current stressors and situation, and review of patient completed daily inventory. Clinician utilized active listening and empathetic response and validated patient emotions. Clinician facilitated processing group on pertinent issues.   Therapist Response:Shann Darreld Mclean Lauman is a 34 y.o. female who presents with depression and anxiety symptoms. Patient arrived within time allowed and reports that she is feeling "okay." Patient rates hermood at a4.5on a scale of 1-10 with 10 being great. Pt reports her weekend was "stressful" and she is concerned she may be "losing time." Pt states anxiety regarding being out of work and finances. Pt reports struggling with social anxiety. Pt is able to process.  Patient engaged in discussion.      Session Time: 10:00-11:00  Participation Level:Active  Behavioral Response:CasualAlertDepressed  Type of Therapy:Group Therapy  Treatment Goals addressed: Coping  Interventions:CBT, DBT, Solution Focused, Supportive and Reframing  Summary:Cln led discussion on struggles with support system wanting to "fix" things. Group discussed frustrations and guilt surrounding these issues and feeling invalidated and unheard. Cln encouraged group members to ask for what they need from support persons and communicating when they are being unhelpful. Group brainstormed ways to address these concerns and overcome possible barriers.   Therapist Response:  Pt engaged in discussion and reports feeling that they can not share what's going on with some people due to feeling invalidated when they do.      Session Time: 11:00- 12:00  Participation Level:Active  Behavioral Response:CasualAlertDepressed  Type of Therapy:Group Therapy  Treatment Goals addressed: Coping  Interventions:CBT, DBT, Solution Focused, Supportive and Reframing  Summary:Cln introduced topic of the five love languages. Cln led discussion on how understanding the concept of love languages can aid in communication, reframing, and having your needs met. Group took a five love languages quiz to determine their love language and discussed the results and how they felt it presented in their lives. Group discussed ways to apply this knowledge in their current relationships.   Therapist Response: Pt engaged in discussion and activity. Pt able to identify ways in which understanding of their love language can improve communication and understanding in their relationships.        Session Time: 12:00 -1:00  Participation Level:Active  Behavioral Response:CasualAlertDepressed  Type of Therapy:Group therapy  Treatment Goals addressed:  Coping  Interventions:CBT; Solution focused; Supportive; Reframing  Summary:12:00 - 12:50: Cln led discussion  on conflict in relationships. Cln utilized handout "Relationship Conflict Resolution" to aid discussion. Group members shared ways they handle issues in relationships and how they can incorporate healthy communication skills to address the issues.  12:50 -1:00 Clinician led check-out. Clinician assessed for immediate needs, medication compliance and efficacy, and safety concerns  Therapist Response:12:00 - 12:50: Pt engaged in discussion and reports issues with shutting down. Pt able to determine ways to address the issue.  12:50 - 1:00: At check-out, patientrates hermood at a 5on a scale of 1-10 with 10 being great.Pt states afternoon plans of doing paperwork. Patient demonstrates someprogress as evidenced byparticipating in first group session.Patient denies SI/HI/self-harm at the end of group.    Suicidal/Homicidal: Nowithout intent/plan  Plan: Pt will continue in PHP while working to decrease depression and anxiety symptoms, increase ability to manage symptoms in a healthy manner, and increase daily functioning.   Diagnosis: MDD (major depressive disorder), recurrent severe, without psychosis ([place]) [F33.2]    1. MDD (major depressive disorder), recurrent severe, without psychosis ([place])       Lorin Glass, LCSW 05/29/2019

## 2019-05-29 NOTE — Psych (Signed)
Virtual Visit via Video Note  I connected with Kristen [name] on 05/12/19 at  9:00 AM EDT by a video enabled telemedicine application and verified that I am speaking with the correct person using two identifiers.   I discussed the limitations of evaluation and management by telemedicine and the availability of in person appointments. The patient expressed understanding and agreed to proceed.  I discussed the assessment and treatment plan with the patient. The patient was provided an opportunity to ask questions and all were answered. The patient agreed with the plan and demonstrated an understanding of the instructions.   The patient was advised to call back or seek an in-person evaluation if the symptoms worsen or if the condition fails to improve as anticipated.  Pt was provided 240 minutes of non-face-to-face time during this encounter.   [name], LCSW    [facility] PHP THERAPIST PROGRESS NOTE  Kristen [name] [id number]  Session Time: 9:00 - 10:00  Participation Level: Active  Behavioral Response: CasualAlertDepressed  Type of Therapy: Group Therapy  Treatment Goals addressed: Coping  Interventions: CBT, DBT, Supportive and Reframing  Summary: Clinician led check-in regarding current stressors and situation, and review of patient completed daily inventory. Clinician utilized active listening and empathetic response and validated patient emotions. Clinician facilitated processing group on pertinent issues.   Therapist Response:Kristen [name] is a 34 y.o. female who presents with depression and anxiety symptoms. Patient arrived within time allowed and reports that she is feeling "okay." Patient rates hermood at [facility] a scale of 1-10 with 10 being great. Pt reports she had a "mini" panic attack yesterday after feeling overwhelmed by details for her leave from work. Pt states she was not able to sleep until 5 am due to feeling elevated. Pt reports struggling with  managing symptoms. Pt is able to process. Patient engaged in discussion.      Session Time: 10:00 -11:00  Participation Level:Active  Behavioral Response:CasualAlertDepressed  Type of Therapy: Group Therapy, psychoeducation, psychotherapy  Treatment Goals addressed: Coping  Interventions:CBT, DBT, Solution Focused, Supportive and Reframing  Summary:Cln led discussion on procrastination. Group members discussed ways in which procrastination is an issue for them. Cln and group brainstormed ways to address procrastination including utilizing positive or negative reinforcement, using an accountability partner, time management tips, and addressing underlying concern behind what task is being put off.   Therapist Response:  Pt engaged in discussion and reports procrastination is a struggle for her. Pt able to process and reports willingness to try managing her anxiety to address procrastination.       Session Time: 11:00 -12:00  Participation Level:Active  Behavioral Response:CasualAlertDepressed  Type of Therapy: Group Therapy, psychotherapy  Treatment Goals addressed: Coping  Interventions:Strengths based, reframing, Supportive,   Summary:Spiritual Care group  Therapist Response: Patient engaged in group. See chaplain note.        Session Time: 12:00- 1:00  Participation Level:Active  Behavioral Response:CasualAlertDepressed  Type of Therapy: Group Therapy, Psychoeducation  Treatment Goals addressed: Coping  Interventions:relaxation training; Supportive; Reframing  Summary:12:00 - 12:50: Relaxation group: Cln led group focused on retraining the body's response to stress.  12:50 -1:00 Clinician led check-out. Clinician assessed for immediate needs, medication compliance and efficacy, and safety concerns  Therapist Response:Pt engaged in activity and discussion.  12:50 - 1:00: At check-out, patientrates  hermood at a 6on a scale of 1-10 with 10 being great.Pt states afternoon plans of going to a doctor's appointment. Patient demonstrates someprogress as evidenced byincreased willingness to  distract herself.Patient denies SI/HI/self-harm at the end of group.    Suicidal/Homicidal: Nowithout intent/plan  Plan: Pt will continue in PHP while working to decrease depression and anxiety symptoms, increase ability to manage symptoms in a healthy manner, and increase daily functioning.   Diagnosis: Severe episode of recurrent major depressive disorder, without psychotic features ([place]) [F33.2]    1. Severe episode of recurrent major depressive disorder, without psychotic features ([place])       Lorin Glass, LCSW 05/29/2019

## 2019-05-30 NOTE — Psych (Signed)
Virtual Visit via Video Note  I connected with Kristen [name] on 05/20/19 at  9:00 AM EDT by a video enabled telemedicine application and verified that I am speaking with the correct person using two identifiers.   I discussed the limitations of evaluation and management by telemedicine and the availability of in person appointments. The patient expressed understanding and agreed to proceed.  I discussed the assessment and treatment plan with the patient. The patient was provided an opportunity to ask questions and all were answered. The patient agreed with the plan and demonstrated an understanding of the instructions.   The patient was advised to call back or seek an in-person evaluation if the symptoms worsen or if the condition fails to improve as anticipated.  Pt was provided 240 minutes of non-face-to-face time during this encounter.   Lorin Glass, LCSW    [facility] PHP THERAPIST PROGRESS NOTE  [name] [id number]  Session Time: 9:00 - 10:00  Participation Level: Active  Behavioral Response: CasualAlertDepressed  Type of Therapy: Group Therapy  Treatment Goals addressed: Coping  Interventions: CBT, DBT, Supportive and Reframing  Summary: Clinician led check-in regarding current stressors and situation, and review of patient completed daily inventory. Clinician utilized active listening and empathetic response and validated patient emotions. Clinician facilitated processing group on pertinent issues.   Therapist Response:Kristen [name] is a 34 y.o. female who presents with depression and anxiety symptoms. Patient arrived within time allowed and reports that she is feeling "foggy." Patient rates hermood at [facility] a scale of 1-10 with 10 being great. Pt reports she is physically feeling ill and is struggling with allergies, asthma, and a migraine with auras. Pt states she spent most of yesterday nursing her migraine and doing chores. Pt identifies experiencing  passive SI yesterday due to feeling "miserable." Pt is able to process. Patient engaged in discussion.      Session Time: 10:00-11:00  Participation Level:Active  Behavioral Response:CasualAlertDepressed  Type of Therapy:Group Therapy  Treatment Goals addressed: Coping  Interventions:CBT, DBT, Solution Focused, Supportive and Reframing  Summary:Cln led discussion on the importance of rest. Group members shared their struggles with allowing themselves time to decompress, rest, and not be "productive" and where these messages originated from and how they are reinforced.    Therapist Response: Pt engaged in discussion and reports receiving contrary messages throughout their life regrarding rest being neccessary rather than something that has to be earned. Pt is able to process.        Session Time: 11:00- 12:00  Participation Level:Active  Behavioral Response:CasualAlertDepressed  Type of Therapy:Group Therapy  Treatment Goals addressed: Coping  Interventions:CBT, DBT, Solution Focused, Supportive and Reframing  Summary:Cln led discussion on positive self-talk and how negative self-esteem affects [place]. Group members shared stuggles they experience with self-esteem and feeling positive about themselves. Cln synthesized CBT reframing, the "best friend test," positive affirmations, and self coaching as ways to address negative self view. Cln encouraged pt's to consider they are not requried to change themselves, for they are worthy and fantastic already, but need to change their inability to see their fantastic qualities.   Therapist Response: Pt engaged in discussion and shares negative selk talk is an issue for her and often leads to rumination. Pt able to process and states she will use self coaching as a way to improve her self-talk.        Session Time: 12:00 -1:00  Participation Level:Active  Behavioral  Response:CasualAlertDepressed  Type of Therapy:Group therapy  Treatment  Goals addressed: Coping  Interventions:CBT; Solution focused; Supportive; Reframing  Summary:12:00 - 12:50: Cln continued topic of distress tolerance skills. Cln reviewed the previously discussed ACCEPTS skills. Group discussed P-T-S skills and how they can utilize them in their current life.   12:50 -1:00 Clinician led check-out. Clinician assessed for immediate needs, medication compliance and efficacy, and safety concerns  Therapist Response:12:00 - 12:50: Pt engaged in discussion and reports understanding of skills discussed. Pt reports she can use reciting a song to practice. 12:50 - 1:00: At check-out, patientrates hermood at a 7on a scale of 1-10 with 10 being great.Pt states afternoon plans of experimenting with her air fryer. Patient demonstrates someprogress as evidenced byincreased ability to manage SI.Patient denies SI/HI/self-harm at the end of group.    Suicidal/Homicidal: Nowithout intent/plan  Plan: Pt will continue in PHP while working to decrease depression and anxiety symptoms, increase ability to manage symptoms in a healthy manner, and increase daily functioning.   Diagnosis: Severe episode of recurrent major depressive disorder, without psychotic features (HCC) [F33.2]    1. Severe episode of recurrent major depressive disorder, without psychotic features (HCC)       [name], LCSW 05/30/2019

## 2019-05-31 ENCOUNTER — Other Ambulatory Visit (HOSPITAL_COMMUNITY): Payer: No Typology Code available for payment source | Admitting: Licensed Clinical Social Worker

## 2019-05-31 ENCOUNTER — Encounter (HOSPITAL_COMMUNITY): Payer: Self-pay | Admitting: Psychiatry

## 2019-05-31 ENCOUNTER — Encounter (HOSPITAL_COMMUNITY): Payer: Self-pay | Admitting: Family

## 2019-05-31 ENCOUNTER — Other Ambulatory Visit: Payer: Self-pay

## 2019-05-31 DIAGNOSIS — F419 Anxiety disorder, unspecified: Secondary | ICD-10-CM | POA: Diagnosis not present

## 2019-05-31 DIAGNOSIS — F329 Major depressive disorder, single episode, unspecified: Secondary | ICD-10-CM

## 2019-05-31 DIAGNOSIS — F332 Major depressive disorder, recurrent severe without psychotic features: Secondary | ICD-10-CM

## 2019-05-31 NOTE — Progress Notes (Signed)
Virtual Visit via Video Note  I connected with Kristen [name] on 05/31/19 at  9:00 AM EDT by a video enabled telemedicine application and verified that I am speaking with the correct person using two identifiers.   I discussed the limitations of evaluation and management by telemedicine and the availability of in person appointments. The patient expressed understanding and agreed to proceed.   I discussed the assessment and treatment plan with the patient. The patient was provided an opportunity to ask questions and all were answered. The patient agreed with the plan and demonstrated an understanding of the instructions.   The patient was advised to call back or seek an in-person evaluation if the symptoms worsen or if the condition fails to improve as anticipated.  I provided 15 minutes of non-face-to-face time during this encounter.   Kristen Rack, NP   [facility] Discharge Summary  Kristen [name] [id number]  Admission date: 05/06/2019 Discharge date: 05/28/2019  Reason for admission: Per discharged admission assessment: Patient is a 34 year old female with a reported past psychiatric history significant for generalized anxiety and depression who presented as a walk-in evaluation to the behavioral health hospital on 04/14/2019. The patient stated that she was having suicidal ideation. She stated that she was having random breakdowns at work. She stated she was under significant stress. 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 She previously was diagnosed with anxiety depression as a child and was treated with Zoloft. She stated she felt as though it it worked at one time, but then "stopped working". She started having increased stress approximately 1 to 2 months ago, and was evaluated. She was started on fluoxetine, and this  was increased to 20 mg a day. She was seen by Dr. Franchot Heidelberg 04/09/2019. Her fluoxetine was increased, and she was placed on FMLA. They also discussed possibly getting neurocognitive testing because of her concern for underlying autism in her case. 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 She stated that all the additional work would fall on her, and she was unable to cope with that. She discussed having crying spells, having to go to the bathroom a great deal just to have these breakdowns. She stated that she had previously cut when she was younger, but now "suppresses that". She admitted to helplessness, hopelessness and worthlessness. She was admitted to the hospital for evaluation and stabilization.   Progress in Program Toward Treatment Goals: Ongoing, patient is stepping down to IOP    Take all medications as prescribed. Keep all follow-up appointments as scheduled.  Do not consume alcohol or use illegal drugs while on prescription medications. Report any adverse effects from your medications to your primary care provider promptly.  In the event of recurrent symptoms or worsening symptoms, call 911, a crisis hotline, or go to the nearest emergency department for evaluation.    Kristen Rack, NP 05/31/2019

## 2019-05-31 NOTE — Progress Notes (Signed)
Virtual Visit via Video Note  I connected with Kristen [name] on @TODAY @ at  9:00 AM EDT by a video enabled telemedicine application and verified that I am speaking with the correct person using two identifiers.   I discussed the limitations of evaluation and management by telemedicine and the availability of in person appointments. The patient expressed understanding and agreed to proceed.  I discussed the assessment and treatment plan with the patient. The patient was provided an opportunity to ask questions and all were answered. The patient agreed with the plan and demonstrated an understanding of the instructions.   The patient was advised to call back or seek an in-person evaluation if the symptoms worsen or if the condition fails to improve as anticipated.  I provided 20 minutes of non-face-to-face time during this encounter.  Patient ID: Kristen [name], female   DOB: Jun 28, 1985, 34 y.o.   MRN: [id number] As per previous CCA states: Pt [facility] to [facility] per psychiatrist, [facility]. [facility]. Pt is tangential and focuses on irrelevancies throughout assessment. Pt was inpt due to SI with plan/intent a few weeks ago. Pt states her depression and anxiety are not much better and she continues to have SI, denies intent/plan at this time. Pt currently sees Dr. [name] Mustache for psychiatry; [facility] prior to him. 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 States she is overwhelmed and unable to handle the work load. 2) Social Interaction: Pt reports she has had a lot of issues with social interactions and over stimulation throughout her life. Denies being tested for Autism at any time. 3) Parents: Pt shares she is the main caregiver for her two disabled parents. 4) Personal: Pt reports she has no time to herself between being a caregiver, working, and having a fianc. 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 Pt denies HI/AVH.  Patient transitioned from [facility] today.  Reports overall mood is stabilizing.  Scale of 1-10 (10 worst); pt rated her depression at a 7 and anxiety at a 8.  Denies SI/HI or A/V hallucinations. A: Admit to MH-IOP.  Oriented to pt MH-IOP.  Encouraged pt to verify her insurance benefits.  Pt gave verbal consent for treatment, to release chart information to referred providers and to complete any forms if needed.  Pt also gave consent for attending group virtually d/t COVID-19 social distancing restrictions.  Encouraged support groups.  Pt to f/u with Dr. [facility] and a therapist.  R:  Patient receptive.   [name] Mustache, M.Ed,CNA
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
## 2019-06-01 ENCOUNTER — Other Ambulatory Visit: Payer: Self-pay

## 2019-06-01 ENCOUNTER — Other Ambulatory Visit (HOSPITAL_COMMUNITY): Payer: No Typology Code available for payment source | Admitting: Psychiatry

## 2019-06-01 DIAGNOSIS — F419 Anxiety disorder, unspecified: Secondary | ICD-10-CM | POA: Diagnosis not present

## 2019-06-01 DIAGNOSIS — R4589 Other symptoms and signs involving emotional state: Secondary | ICD-10-CM

## 2019-06-01 DIAGNOSIS — F411 Generalized anxiety disorder: Secondary | ICD-10-CM

## 2019-06-01 DIAGNOSIS — F332 Major depressive disorder, recurrent severe without psychotic features: Secondary | ICD-10-CM

## 2019-06-01 NOTE — Progress Notes (Signed)
Virtual Visit via Video Note  I connected with Kristen [name] on 06/01/19 at  9:00 AM EDT by a video enabled telemedicine application and verified that I am speaking with the correct person using two identifiers.   Case Manager discussed the limitations of evaluation and management by telemedicine and the availability of in person appointments. The patient expressed understanding and agreed to proceed.  Location:  Patient: Patient Home Provider: [facility]  History of Present Illness: MDD, GAD  Observations/Objective: Case Manager checked in with all participants to review discharge dates, insurance authorizations, work-related documents and needs for the treatment team. Clinician facilitated a check-in with group members to assess mood and current functioning. Clinician introduced self and prompted client to provide an update on functioning since last group meeting. Clinician shared guided meditation 'Clear Your [place]' and encouraged members to engage in mindfulness as well as provide feedback on their experience. Client was active throughout check in and introduced self to clinician providing detail into events that led to current engagement in IOP services which include a hospitalization due to increasing SI followed by completing Partial Hospitalization Program and stepping down into this group. 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 Client reported she is hopeful to gain healthy coping skills to be able to "live with this depression and anxiety and be able to function". Client reported enjoying shared meditation and discussed feeling relaxed in areas that she typically carries stress such as her shoulders and neck. Client did not endorse SI/HI/psychosis during this  engagement.  Psycho-educational portion of group centered on Cognitive Distortions and unhealthy thinking patterns. Clinician provided oversight on distorted thinking patters and shared video 'Cognitive Distortion's and allowed space for members to share their responses. Clinician provided psycho-educational handouts titled 'Cognitive Distortions' and 'Challenging Negative Thoughts' and prompted members to identify any distorted thinking patterns that they engage in while processing feelings associated with those thoughts as well as behaviors. Clinician validated members feelings and engaged in discussion on the importance in recognizing these thoughts in order to make change. Clinician provided CBT thought record to assist members in identifying alternate thoughts on their own. Clinician facilitated check out and assessed for 1 self-care activity prior to tomorrow's group. Client reported that she finds herself engaging in catastrophizing and fortune telling and provided examples in which this plays out in her day to day life. Client reported that she would find it helpful to challenge these distorted thoughts by asking self "what would a friend think about this?". Client related to peers throughout session and actively engaged in discussion. Client completed thought record and identified distorted thoughts regarding being invited to go to a theme park. Client successfully identified associated feelings and behaviors as well as an alternate thought. Client actively engaged in check out and reported she plays to "do a meditation" and clean around her house prior to next group.  Assessment and Plan: Clinician recommends that patient remains in IOP treatment to better manage mental health symptoms and continue to address treatment plan goals. Clinician recommends adherence to crisis/safety plan, taking medications as prescribed, and following up with medical professionals if any issues arise.  Follow Up  Instructions: Clinician will send Webex link for next session. The patient was advised to call back or seek an in-person evaluation if the symptoms worsen or if the  condition fails to improve as anticipated.  The patient was advised to call back or seek an in-person evaluation if the symptoms worsen or if the condition fails to improve as anticipated.  I provided 180 minutes of non-face-to-face time during this encounter.   Francine Graven, LCSW

## 2019-06-02 ENCOUNTER — Other Ambulatory Visit: Payer: Self-pay

## 2019-06-02 ENCOUNTER — Other Ambulatory Visit (HOSPITAL_COMMUNITY): Payer: No Typology Code available for payment source | Admitting: Licensed Clinical Social Worker

## 2019-06-02 DIAGNOSIS — F411 Generalized anxiety disorder: Secondary | ICD-10-CM

## 2019-06-02 DIAGNOSIS — F419 Anxiety disorder, unspecified: Secondary | ICD-10-CM | POA: Diagnosis not present

## 2019-06-02 DIAGNOSIS — F332 Major depressive disorder, recurrent severe without psychotic features: Secondary | ICD-10-CM

## 2019-06-02 NOTE — Progress Notes (Signed)
Virtual Visit via Video Note   I connected with Kristen [name] on 06/02/19 at 9:00 AM EST by a video enabled telemedicine application and verified that I am speaking with the correct person using two identifiers.   Location: Patient: Patient Home Provider: [facility]   Case Manager discussed the limitations of evaluation and management by telemedicine and the availability of in person appointments during orientation. The patient expressed understanding and agreed to proceed.   History of Present Illness: GAD and MDD   Observations/Objective: Case Manager checked in with all participants to review discharge dates, insurance authorizations, work-related documents and needs for the treatment team. Counselor facilitated a check-in with group members to gauge mood and current functioning as well as identify recent progress towards treatment goals.  Kristen [name] presented to group session on time and was alert, oriented x5, with no evidence or self-report of SI/HI or A/V H.  Kristen [name] reported scores of 7/10 for depression and 9/10 for anxiety today.  Kristen [name] introduced herself to counselor and reported that she decided to engage in IOP so that she can learn new coping skills to deal with anxiety and depression in healthier ways.  Kristen [name] reported that some of her current copings skills/self-care activities include drawing, playing video games, and watching anime or reading manga.  Kristen [name] reported that yesterday was okay, as she went out and got strawberries with her boyfriend, and found it nice to get outside.  She also reported that they watched the moon later on.  Kristen [name] reported that her plan today is to work on completing disability process, and prepare for a job interview tomorrow, as she is looking for a position that won't push her past her personal limitations and worsen mental health.      Counselor introduced topic of self-esteem today and defined this as the value an  individual places on oneself, based upon assessment of personal worth as a human being and approval/disapproval of one's behavior. Counselor asked members to assess their level of self-esteem at this time based upon common indicators of high self-esteem, including: accepting oneself unconditionally;  having self-respect and deep seated belief that one matters; being unaffected by other people's opinions/criticisms; and showing good control over emotions.  Counselor also explained concept of one's inner critic which serves to highlight faults and minimize strengths, directly influencing low sense of self-esteem.  Counselor then provided handout on 'strengths and qualities', which featured questions to guide discussion and increase awareness of each member's unique individual abilities which could reinforce higher self-esteem. Examples of questions included: 'things I am good at', 'challenges I have overcome', and 'what I like about myself'.   Interventions were effective, as evidenced by Kristen [name] engaging in discussion on the subject and reporting that she has struggled with low self esteem since she was a child.  Kristen [name] reported that she tends to have a negative view of herself, highlighting the fact that she feels socially awkward, tends to be highly anxious, and puts others before herself.  Kristen [name] reported that this has led to regular automatic negative thoughts which lead to consequences such as isolation, neglect of personal care, and loss of hope.  Kristen [name] reported that work has also had a negative impact on self-esteem and mental health, as they demands for her job are too great, and the structure has not been supportive.  She reported that some core beliefs including self-esteem include "Ill never be good enough" and "I must never make a mistake", which lead her to avoid going outside  of her comfort zone.  Kristen [name] reported that she was open to stating process of journaling to confront her negative  beliefs, and stated "I feel like a better person on paper, so it could be helpful".  She was also able to identify some strengths/positive traits to improve self-esteem, including creativity, artfulness, understanding, empathetic, and caring.  She reported that some challenges she has overcome which improve self-esteem include getting through high school and college, as well as deciding to begin IOP to improve her mental health.  Kristen [name] reported that this session motivated her to look into more self-care activities to plan for in future, including preparing for a beach trip, trying to dress nicely each day, looking into riding horses again, and visiting the science center.    Assessment and Plan: Counselor recommends that patient remains in IOP treatment to better manage mental health symptoms and continue to address treatment plan goals. Counselor recommends adherence to crisis/safety plan, taking medications as prescribed and following up with medical professionals if any issues arise.    Follow Up Instructions: Counselor will send Webex link for next session.  The patient was advised to call back or seek an in-person evaluation if the symptoms worsen or if the condition fails to improve as anticipated.   I provided 165 minutes of non-face-to-face time during this encounter.     Shade Flood, LCSW, LCAS

## 2019-06-03 ENCOUNTER — Other Ambulatory Visit: Payer: Self-pay

## 2019-06-03 ENCOUNTER — Other Ambulatory Visit (HOSPITAL_COMMUNITY): Payer: No Typology Code available for payment source | Admitting: Family

## 2019-06-03 DIAGNOSIS — F419 Anxiety disorder, unspecified: Secondary | ICD-10-CM | POA: Diagnosis not present

## 2019-06-03 DIAGNOSIS — F332 Major depressive disorder, recurrent severe without psychotic features: Secondary | ICD-10-CM

## 2019-06-03 DIAGNOSIS — F411 Generalized anxiety disorder: Secondary | ICD-10-CM

## 2019-06-03 NOTE — Progress Notes (Signed)
Virtual Visit via Video Note  I connected with Kristen [name] on 06/03/19 at  9:00 AM EDT by a video enabled telemedicine application and verified that I am speaking with the correct person using two identifiers.   Case Manager discussed the limitations of evaluation and management by telemedicine and the availability of in person appointments. The patient expressed understanding and agreed to proceed.  History of Present Illness: GAD and MDD  Observations/Objective: Case Manager checked in with all participants to review discharge dates, insurance authorizations, work-related documents and needs for the treatment team. Clinician facilitated a check-in with group members to assess mood and current functioning. Clinician introduced self and prompted client to provide an update on functioning since last group meeting. Clinician provided guided meditation '[place]' following check-in process. Client was active throughout check in and provided an update reporting that she continues to struggle with sleep and reported that she plans to follow up with program NP Tanika regarding this issue. Client engaged in discussion with a peer on how sleeping difficulties effect her emotionally and her day to day functioning. Client discussed stressor related to applying for disability services and received feedback from a peer on their experience in the application process. Client reported she struggles with guilt and shame in her attempt to obtain services "because I'm so young and there's other people that need it". Client processed through these feelings and discussed a pattern of worrying about things that are out of control. Client did not endorse any current SI/HI/psychosis.   Psycho-educational portion of group centered on identifying and relaying needs to others in order to feel supported. Clinician introduced topic and assessed member's ability to relay their needs to others and any potential barriers in  doing so. Clinician prompted discussion on the value in relaying needs to others to improve relationships and mood. Clinician shared video titled 'What is Your Love Language' and encouraged members to share their responses. Clinician validated client's feelings and encouraged them to consider their own love language that can be applied in various relationships in their life. Client reported that she has completed this quiz before and shared past results. Client reported that she doesn't expect these results to change however was open to completing the activity again. Client shared feedback with other members and related to their identified experiences.  Clinician shared a Love Language quiz both virtually and a handout to group members. Clinician encouraged members to take quiz and then share responses. Clinician utilized open ended questions and CBT to challenge member's thoughts regarding their responses as well as how they can inform others on how to best support them. Clinician assessed for 1 self-care activity prior to tomorrow's group. Client took the quiz and reported her top love language, physical touch, is now tied with acts of services. Client engaged in discussion on the emphasis of thoughtfulness behind receiving gifts rather than the monetary cost. Client relayed that she typically shows love in this way as well by purchasing gifts for loved ones. Client identified a self-care activity of "getting outside of the house, maybe the mall or something". Client is scheduled to complete this program tomorrow.  Assessment and Plan: Clinician recommends that patient remains in IOP treatment to better manage mental health symptoms and continue to address treatment plan goals. Clinician recommends adherence to crisis/safety plan, taking medications as prescribed, and following up with medical professionals if any issues arise.  Follow Up Instructions: Clinician will send Webex link for next session. The  patient  was advised to call back or seek an in-person evaluation if the symptoms worsen or if the condition fails to improve as anticipated.    The patient was advised to call back or seek an in-person evaluation if the symptoms worsen or if the condition fails to improve as anticipated.  I provided 165 minutes of non-face-to-face time during this encounter.   Francine Graven, LCSW

## 2019-06-03 NOTE — Psych (Signed)
Virtual Visit via Video Note  I connected with Waynette Buttery on 05/24/19 at  9:00 AM EDT by a video enabled telemedicine application and verified that I am speaking with the correct person using two identifiers.   I discussed the limitations of evaluation and management by telemedicine and the availability of in person appointments. The patient expressed understanding and agreed to proceed.  I discussed the assessment and treatment plan with the patient. The patient was provided an opportunity to ask questions and all were answered. The patient agreed with the plan and demonstrated an understanding of the instructions.   The patient was advised to call back or seek an in-person evaluation if the symptoms worsen or if the condition fails to improve as anticipated.  Pt was provided 240 minutes of non-face-to-face time during this encounter.   Lorin Glass, LCSW    [facility] PHP THERAPIST PROGRESS NOTE  TONNETTE ZWIEBEL [id number]  Session Time: 9:00 - 10:00  Participation Level: Active  Behavioral Response: CasualAlertDepressed  Type of Therapy: Group Therapy  Treatment Goals addressed: Coping  Interventions: CBT, DBT, Supportive and Reframing  Summary: Clinician led check-in regarding current stressors and situation, and review of patient completed daily inventory. Clinician utilized active listening and empathetic response and validated patient emotions. Clinician facilitated processing group on pertinent issues.   Therapist Response:Melvina Darreld Mclean Sarti is a 34 y.o. female who presents with depression and anxiety symptoms. Patient arrived within time allowed and reports that she is feeling "sick." Patient rates hermood at a3on a scale of 1-10 with 10 being great. Pt reports she felt ill all weekend and stayed at home. Pt reports her mood was "gloomy and numb" and she experienced passive SI due to her physical symptoms. Pt states she utilized distraction to manage these  thoughts. Pt is able to process. Patient engaged in discussion.      Session Time: 10:00-11:00  Participation Level:Active  Behavioral Response:CasualAlertDepressed  Type of Therapy:Group Therapy  Treatment Goals addressed: Coping  Interventions:CBT, DBT, Solution Focused, Supportive and Reframing  Summary:Cln led discussion on the ways our willingness/resistance to be vulnerable affects our relationships. Group members shared struggles they encounter with being vulnerable including trust issues, poor self-esteem, and not trusting themselved with negative feelings. Group was given space to process and receive and give support.   Therapist Response: Pt engaged in discussion and reports vulnerability is difficult for her due to issues with self-esteem. Pt is able to process.        Session Time: 11:00- 12:00  Participation Level:Active  Behavioral Response:CasualAlertDepressed  Type of Therapy:Group Therapy  Treatment Goals addressed: Coping  Interventions:CBT, DBT, Solution Focused, Supportive and Reframing  Summary:Cln led discussion on family dynamics and how our family of origin and the messages we receive affect [place] moving forward. Group members shared and connected with one another regarding feeling unseen in their families and the ways in which it affected their self-esteem and relationships. Grop members are able to process and recieve support. Cln utilized thought challenging to reframe and offer alternative perspectives.  Therapist Response: Pt engaged in discussion. Pt was able to process ways in which she felt unseen in her family and how it affects her. Pt is able to reframe and offer a kinder perspective on the situation that can encourage healing.        Session Time: 12:00 -1:00  Participation Level:Active  Behavioral Response:CasualAlertDepressed  Type of Therapy:Group therapy  Treatment Goals  addressed: Coping  Interventions:CBT; Solution focused; Supportive; Reframing  Summary:12:00 - 12:50: Cln introduced topic of cognitive distortions. Cln utilized CBT model to illustrate how out thoughts affect feelings and fuel our reactions. Group members discussed ways in which they notice their feelings overtake their actions and cause issues for them.  12:50 -1:00 Clinician led check-out. Clinician assessed for immediate needs, medication compliance and efficacy, and safety concerns  Therapist Response:12:00 - 12:50: Pt engaged in discussion and reports understanding of CBT model and how it can be used to decrease negative consequences for our mindset, relationships, and mental health.  12:50 - 1:00: At check-out, patientrates hermood at a 6on a scale of 1-10 with 10 being great.Pt states afternoon plans of being in the sunshine and reading. Patient demonstrates someprogress as evidenced by utilizing skills at home.Patient denies SI/HI/self-harm at the end of group.    Suicidal/Homicidal: Nowithout intent/plan  Plan: Pt will continue in PHP while working to decrease depression and anxiety symptoms, increase ability to manage symptoms in a healthy manner, and increase daily functioning.   Diagnosis: Severe episode of recurrent major depressive disorder, without psychotic features (HCC) [F33.2]    1. Severe episode of recurrent major depressive disorder, without psychotic features (HCC)       [name], LCSW 06/03/2019

## 2019-06-04 ENCOUNTER — Ambulatory Visit (HOSPITAL_COMMUNITY): Payer: No Typology Code available for payment source

## 2019-06-04 ENCOUNTER — Other Ambulatory Visit: Payer: Self-pay

## 2019-06-04 ENCOUNTER — Encounter (HOSPITAL_COMMUNITY): Payer: Self-pay | Admitting: Family

## 2019-06-04 ENCOUNTER — Other Ambulatory Visit (HOSPITAL_COMMUNITY): Payer: No Typology Code available for payment source | Admitting: Psychiatry

## 2019-06-04 DIAGNOSIS — F332 Major depressive disorder, recurrent severe without psychotic features: Secondary | ICD-10-CM

## 2019-06-04 DIAGNOSIS — F411 Generalized anxiety disorder: Secondary | ICD-10-CM

## 2019-06-04 DIAGNOSIS — F419 Anxiety disorder, unspecified: Secondary | ICD-10-CM | POA: Diagnosis not present
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
## 2019-06-04 NOTE — Progress Notes (Signed)
Virtual Visit via Video Note  I connected with Kristen [name] on @TODAY @ at  9:00 AM EDT by a video enabled telemedicine application and verified that I am speaking with the correct person using two identifiers. I discussed the limitations of evaluation and management by telemedicine and the availability of in person appointments. The patient expressed understanding and agreed to proceed. I discussed the assessment and treatment plan with the patient. The patient was provided an opportunity to ask questions and all were answered. The patient agreed with the plan and demonstrated an understanding of the instructions. The patient was advised to call back or seek an in-person evaluation if the symptoms worsen or if the condition fails to improve as anticipated.  I provided 20 minutes of non-face-to-face time during this encounter.   Patient ID: Kristen [name], female   DOB: Dec 03, 1985, 34 y.o.   MRN: [id number] As per previous CCA states: Pt reports [facility] per psychiatrist, [facility]. Pt is tangential and focuses on irrelevancies throughout assessment. Pt was inpt due to SI with plan/intent a few weeks ago. Pt states her depression and anxiety are not much better and she continues to have SI, denies intent/plan at this time. Pt currently sees Dr. [name] Mustache for psychiatry; [facility] prior to him. 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 States she is overwhelmed and unable to handle the work load. 2) Social Interaction: Pt reports she has had a lot of issues with social interactions and over stimulation throughout her life. Denies being tested for Autism at any time. 3) Parents: Pt shares she is the main caregiver for her two disabled parents. 4) Personal: Pt reports she has no time to herself between being a caregiver, working, and having a fianc. 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 Pt denies HI/AVH.  Patient transitioned from [facility] today.  Reports overall mood is stabilizing.  Scale of 1-10 (10 worst); pt rated her depression at a 7 and anxiety at a 8.  Denies SI/HI or A/V hallucinations.   Pt's insurance company will be terminating today.  Pt states she had been told that she needed to look for employment elsewhere.  Pt was active in all the groups.  Denies SI/HI or A/V hallucinations. A: D/C today.  Discussed options re: F/U.  Informed pt that [facility] and [facility] are on a sliding scale or she can f/u with [facility].  Mentioned the cost for seeing Dr. [facility] without any insurance.  Also, encouraged pt to complete the application for assistance.  Strongly encouraged support groups at [facility].   R:  Patient receptive.   [name] Mustache, M.Ed,CNA

## 2019-06-04 NOTE — Patient Instructions (Signed)
D:  Patient completed MH-IOP today.  A:  Will follow up with providers of her choice since her insurance has terminated.  Discussed with patient the cost of seeing Dr. [name] Mustache as self pay.  Provided pt with practices with sliding scales ([facility] [phone] and [facility] [phone] or patient can follow up with [facility].  Strongly recommended patient follow up with groups at [facility] [phone]).  R:  Pt receptive.
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
## 2019-06-07 ENCOUNTER — Other Ambulatory Visit (HOSPITAL_COMMUNITY): Payer: No Typology Code available for payment source

## 2019-06-07 ENCOUNTER — Ambulatory Visit (HOSPITAL_COMMUNITY): Payer: No Typology Code available for payment source

## 2019-06-07 ENCOUNTER — Other Ambulatory Visit: Payer: Self-pay

## 2019-06-07 NOTE — Psych (Signed)
Virtual Visit via Video Note  I connected with Kristen [name] on 05/25/19 at  9:00 AM EDT by a video enabled telemedicine application and verified that I am speaking with the correct person using two identifiers.   I discussed the limitations of evaluation and management by telemedicine and the availability of in person appointments. The patient expressed understanding and agreed to proceed.  I discussed the assessment and treatment plan with the patient. The patient was provided an opportunity to ask questions and all were answered. The patient agreed with the plan and demonstrated an understanding of the instructions.   The patient was advised to call back or seek an in-person evaluation if the symptoms worsen or if the condition fails to improve as anticipated.  Pt was provided 240 minutes of non-face-to-face time during this encounter.   [name], LCSW    [facility] PHP THERAPIST PROGRESS NOTE  Kristen [name] [id number]  Session Time: 9:00 - 10:00  Participation Level: Active  Behavioral Response: CasualAlertDepressed  Type of Therapy: Group Therapy  Treatment Goals addressed: Coping  Interventions: CBT, DBT, Supportive and Reframing  Summary: Clinician led check-in regarding current stressors and situation, and review of patient completed daily inventory. Clinician utilized active listening and empathetic response and validated patient emotions. Clinician facilitated processing group on pertinent issues.   Therapist Response:Kristen [name] is a 34 y.o. female who presents with depression and anxiety symptoms. Patient arrived within time allowed and reports that she is feeling "blah." Patient rates hermood at [facility] a scale of 1-10 with 10 being great. Pt reports she is still struggling with her asthma and spent time outside reading yesterday. Pt states she continues to struggle with falling asleep  Pt is able to process. Patient engaged in  discussion.      Session Time: 10:00-11:00  Participation Level:Active  Behavioral Response:CasualAlertDepressed  Type of Therapy:Group Therapy  Treatment Goals addressed: Coping  Interventions:CBT, DBT, Solution Focused, Supportive and Reframing  Summary:Cln led discussion on aggression substitutions. Group members discussed anger, the ways in which they respond to anger, and the consequences for their responses. Cln encouraged pt's to utilize anger outlets in order to express the feeling and aggression pent up, but to find outlets that will not garner negative consequences. Group members brainstormed substitutions such as stomping cans, screaming into a pillow, writing what you want to say and burning it, kickboxing, and throwing pinecones.   Therapist Response: Pt engaged in discussion and reports willingness to try journaling her anger.       Session Time: 11:00- 12:00  Participation Level:Active  Behavioral Response:CasualAlertDepressed  Type of Therapy: Group Therapy, OT  Treatment Goals addressed: Coping  Interventions:Psychosocial skills training, Supportive  Summary:Occupational Therapy group  Therapist Response:Patient engaged in group. See OT note.       Session Time: 12:00 -1:00  Participation Level:Active  Behavioral Response:CasualAlertDepressed  Type of Therapy:Group therapy  Treatment Goals addressed: Coping  Interventions:CBT; Solution focused; Supportive; Reframing  Summary:12:00 - 12:50: Cln continued topic of cognitive distortions. Cln utilized handout "cognitive distortions" and group reviewed different types of irrational thought patterns and brainstormed examples of each type to increase awareness of when our thoughts are distorted.  12:50 -1:00 Clinician led check-out. Clinician assessed for immediate needs, medication compliance and efficacy, and safety concerns  Therapist  Response:12:00 - 12:50:Pt engaged in discussion and is able to determine personal examples for each type of thought pattern discussed.  12:50 - 1:00: At check-out, patientrates hermood at a 6on a  scale of 1-10 with 10 being great.Pt states afternoon plans of relaxing. Patient demonstrates someprogress as evidenced by decreased guilt regarding coping skills.Patient denies SI/HI/self-harm at the end of group.    Suicidal/Homicidal: Nowithout intent/plan  Plan: Pt will continue in PHP while working to decrease depression and anxiety symptoms, increase ability to manage symptoms in a healthy manner, and increase daily functioning.   Diagnosis: MDD (major depressive disorder), recurrent severe, without psychosis ([place]) [F33.2]    1. MDD (major depressive disorder), recurrent severe, without psychosis ([place])   2. Anxiety and depression       Lorin Glass, LCSW 06/07/2019

## 2019-06-07 NOTE — Psych (Signed)
Virtual Visit via Video Note  I connected with Kristen [name] on 05/27/19 at  9:00 AM EDT by a video enabled telemedicine application and verified that I am speaking with the correct person using two identifiers.   I discussed the limitations of evaluation and management by telemedicine and the availability of in person appointments. The patient expressed understanding and agreed to proceed.  I discussed the assessment and treatment plan with the patient. The patient was provided an opportunity to ask questions and all were answered. The patient agreed with the plan and demonstrated an understanding of the instructions.   The patient was advised to call back or seek an in-person evaluation if the symptoms worsen or if the condition fails to improve as anticipated.  Pt was provided 240 minutes of non-face-to-face time during this encounter.   Lorin Glass, LCSW    [facility] PHP THERAPIST PROGRESS NOTE  Kristen [name] [id number]  Session Time: 9:00 - 10:00  Participation Level: Active  Behavioral Response: CasualAlertDepressed  Type of Therapy: Group Therapy  Treatment Goals addressed: Coping  Interventions: CBT, DBT, Supportive and Reframing  Summary: Clinician led check-in regarding current stressors and situation, and review of patient completed daily inventory. Clinician utilized active listening and empathetic response and validated patient emotions. Clinician facilitated processing group on pertinent issues.   Therapist Response:Kristen [name] is a 34 y.o. female who presents with depression and anxiety symptoms. Patient arrived within time allowed and reports that she is feeling "not good." Patient rates hermood at [facility] a scale of 1-10 with 10 being great. Pt reports she is overwhelmed by physical symptoms of her cold, asthma, and period symptoms. Pt states she spent yesterday watching tv and napping. Pt reports struggling with moving past physical symptoms. Pt is  able to process. Patient engaged in discussion.      Session Time: 10:00-11:00  Participation Level:Active  Behavioral Response:CasualAlertDepressed  Type of Therapy:Group Therapy  Treatment Goals addressed: Coping  Interventions:CBT, DBT, Solution Focused, Supportive and Reframing  Summary:Cln led discussion on reframing difficulties. Cln shared poem "Choose your hard" and group discussed take aways. Cln encouraged pt's to look at both sides of a situation, not only the option that is a change or takes effort. Group discussed how not looking at the cost of inaction can lead to unexpected outcomes.   Therapist Response: Pt engaged in discussion and reports she often has a bias to not change and does not consider the cost of inaction.         Session Time: 11:00- 12:00  Participation Level:Active  Behavioral Response:CasualAlertDepressed  Type of Therapy:Group Therapy  Treatment Goals addressed: Coping  Interventions:CBT, DBT, Solution Focused, Supportive and Reframing  Summary:Cln continued topic of cognitive distortions. Cln utilized handout "unhealthy thought patterns" and group reviewed different types of irrational thought patterns and brainstormed examples of each type to increase awareness of when our thoughts are distorted.   Therapist Response: Pt engaged in discussion and reports struggling most with personalization, mind reading, and emotional reasoning.         Session Time: 12:00 -1:00  Participation Level:Active  Behavioral Response:CasualAlertDepressed  Type of Therapy:Group therapy  Treatment Goals addressed: Coping  Interventions:CBT; Solution focused; Supportive; Reframing  Summary:12:00 - 12:50: Cln continued topic of cognitive distortions. Cln led practice to increase awareness of distorted thoughts. Cln read generic examples of problematic thinking and group worked togther to identify  the distorted thoughts present in the example. Cln encouraged pt to practice "catching" distorted thoughts  whenever able to increase the skill of being able to notice distortions in the moment and decrease the negative impact they create for [place].  12:50 -1:00 Clinician led check-out. Clinician assessed for immediate needs, medication compliance and efficacy, and safety concerns  Therapist Response:12:00 - 12:50:  Pt engaged in discussion and demonstrates understanding of cognitive distortions throughout activity. 12:50 - 1:00: At check-out, patientrates hermood at a 8on a scale of 1-10 with 10 being great.Pt states afternoon plans of running errands and relaxing. Patient demonstrates someprogress as evidenced by improved mood throughout session.Patient denies SI/HI/self-harm at the end of group.    Suicidal/Homicidal: Nowithout intent/plan  Plan: Pt will continue in PHP while working to decrease depression and anxiety symptoms, increase ability to manage symptoms in a healthy manner, and increase daily functioning.   Diagnosis: Severe episode of recurrent major depressive disorder, without psychotic features (HCC) [F33.2]    1. Severe episode of recurrent major depressive disorder, without psychotic features (HCC)       [name], LCSW 06/07/2019

## 2019-06-07 NOTE — Psych (Signed)
Virtual Visit via Video Note  I connected with Waynette Buttery on 05/26/19 at  9:00 AM EDT by a video enabled telemedicine application and verified that I am speaking with the correct person using two identifiers.   I discussed the limitations of evaluation and management by telemedicine and the availability of in person appointments. The patient expressed understanding and agreed to proceed.  I discussed the assessment and treatment plan with the patient. The patient was provided an opportunity to ask questions and all were answered. The patient agreed with the plan and demonstrated an understanding of the instructions.   The patient was advised to call back or seek an in-person evaluation if the symptoms worsen or if the condition fails to improve as anticipated.  Pt was provided 240 minutes of non-face-to-face time during this encounter.   Lorin Glass, LCSW    [facility] PHP THERAPIST PROGRESS NOTE  MELANIE OPENSHAW [id number]  Session Time: 9:00 - 10:00  Participation Level: Active  Behavioral Response: CasualAlertDepressed  Type of Therapy: Group Therapy  Treatment Goals addressed: Coping  Interventions: CBT, DBT, Supportive and Reframing  Summary: Clinician led check-in regarding current stressors and situation, and review of patient completed daily inventory. Clinician utilized active listening and empathetic response and validated patient emotions. Clinician facilitated processing group on pertinent issues.   Therapist Response:Tessa Darreld Mclean Odell is a 34 y.o. female who presents with depression and anxiety symptoms. Patient arrived within time allowed and reports that she is feeling "tired." Patient rates hermood at a5 on a scale of 1-10 with 10 being great. Pt reports she "didn't do much" yesterday and is still recovering from allergies. Pt states she read, drew, and wrote yesterday and creativity has been a good outlet for her. Pt reports struggling with feeling  numb. Pt is able to process. Patient engaged in discussion.        Session Time: 10:00 -11:00  Participation Level:Active  Behavioral Response:CasualAlertDepressed  Type of Therapy: Group Therapy, psychoeducation, psychotherapy  Treatment Goals addressed: Coping  Interventions:CBT, DBT, Solution Focused, Supportive and Reframing  Summary:Cln led discussion on addressing fear of change. Group members shared ways in which they are aprehensive of discharge and changes in treatment. Cln utilized CBT reframing and thought challenging to encourage pt's to seek a different perspective. Group discussed ways in which to increase awareness of progress and to remember that if I am different, than things cannot be the same as they were. Group able to process.  Therapist Response: Pt engaged in discussion and reports struggling with change. Pt is able to identify ways in which she is different and has handled things differently to increase confidence in her abilities to handle change successfully.       Session Time: 11:00 -12:00  Participation Level:Active  Behavioral Response:CasualAlertDepressed  Type of Therapy: Group Therapy, psychotherapy  Treatment Goals addressed: Coping  Interventions:Strengths based, reframing, Supportive,   Summary:Spiritual Care group  Therapist Response: Patient engaged in group. See chaplain note.        Session Time: 12:00- 1:00  Participation Level:Active  Behavioral Response:CasualAlertDepressed  Type of Therapy: Group Therapy, Psychoeducation  Treatment Goals addressed: Coping  Interventions:relaxation training; Supportive; Reframing  Summary:12:00 - 12:50: Relaxation group: Cln led group focused on retraining the body's response to stress.  12:50 -1:00 Clinician led check-out. Clinician assessed for immediate needs, medication compliance and efficacy, and safety  concerns  Therapist Response:Pt engaged in activity and discussion.  12:50 - 1:00: At check-out, patientrates hermood at a 6on  a scale of 1-10 with 10 being great.Pt states afternoon plans of going to the store and napping. Patient demonstrates someprogress as evidenced byincreased ability to cope.Patient denies SI/HI/self-harm at the end of group.    Suicidal/Homicidal: Nowithout intent/plan  Plan: Pt will continue in PHP while working to decrease depression and anxiety symptoms, increase ability to manage symptoms in a healthy manner, and increase daily functioning.   Diagnosis: Severe episode of recurrent major depressive disorder, without psychotic features (HCC) [F33.2]    1. Severe episode of recurrent major depressive disorder, without psychotic features (HCC)       [name], LCSW 06/07/2019

## 2019-06-07 NOTE — Psych (Signed)
Virtual Visit via Video Note  I connected with Waynette Buttery on 05/28/19 at  9:00 AM EDT by a video enabled telemedicine application and verified that I am speaking with the correct person using two identifiers.   I discussed the limitations of evaluation and management by telemedicine and the availability of in person appointments. The patient expressed understanding and agreed to proceed.  I discussed the assessment and treatment plan with the patient. The patient was provided an opportunity to ask questions and all were answered. The patient agreed with the plan and demonstrated an understanding of the instructions.   The patient was advised to call back or seek an in-person evaluation if the symptoms worsen or if the condition fails to improve as anticipated.  Pt was provided 240 minutes of non-face-to-face time during this encounter.   Lorin Glass, LCSW    [facility] PHP THERAPIST PROGRESS NOTE  EMARIE PAUL [id number]  Session Time: 9:00 - 10:00  Participation Level: Active  Behavioral Response: CasualAlertDepressed  Type of Therapy: Group Therapy  Treatment Goals addressed: Coping  Interventions: CBT, DBT, Supportive and Reframing  Summary: Clinician led check-in regarding current stressors and situation, and review of patient completed daily inventory. Clinician utilized active listening and empathetic response and validated patient emotions. Clinician facilitated processing group on pertinent issues.   Therapist Response:Lamis Darreld Mclean Eckersley is a 34 y.o. female who presents with depression and anxiety symptoms. Patient arrived within time allowed and reports that she is feeling "tired." Patient rates hermood at [facility] a scale of 1-10 with 10 being great. Pt reports she spoke to a close friend about what she is dealing with and that it was a good conversation. Pt reports it was difficult for her to share this information however she was able to seek that support. Pt  is able to process. Patient engaged in discussion.      Session Time: 10:00-11:00  Participation Level:Active  Behavioral Response:CasualAlertDepressed  Type of Therapy:Group Therapy  Treatment Goals addressed: Coping  Interventions:CBT, DBT, Solution Focused, Supportive and Reframing  Summary:Cln led discussion on anxieties about decision making. Cln introduced DBT concept of radical acceptance as a way to decrease stress re: lack of control. Group members shared struggles they have with control and what it might be like to apply radical acceptance. Cln introduced decision making model of choosing what is most important at that time and letting it be the guiding factor in your decision. Group worked together on decisions they are struggling with and applying this model.   Therapist Response: Pt engaged in discussion and reports control is an issue for her and she struggles to accept that things are outside of her control. Pt is able to apply decision making model with other group members and reports understanding.       Session Time: 11:00- 12:00  Participation Level:Active  Behavioral Response:CasualAlertDepressed  Type of Therapy: Group Therapy, OT  Treatment Goals addressed: Coping  Interventions:Psychosocial skills training, Supportive  Summary:Occupational Therapy group  Therapist Response:Patient engaged in group. See OT note.       Session Time: 12:00 -1:00  Participation Level:Active  Behavioral Response:CasualAlertDepressed  Type of Therapy:Group therapy  Treatment Goals addressed: Coping  Interventions:CBT; Solution focused; Supportive; Reframing  Summary:12:00 - 12:50: Cln continued topic of cognitive distortions. Cln introduced how to "challenge" irrational thought patterns by bringing in logic and determining other point of views. Group worked together to Engineer, maintenance distorted thoughts  and problem solved barriers as they arose.  12:50 -  1:00 Clinician led check-out. Clinician assessed for immediate needs, medication compliance and efficacy, and safety concerns  Therapist Response:12:00 - 12:50:Pt engaged in discussion and practice. Pt reports increased confidence in being able to address and challenge distorted thinking.   12:50 - 1:00: At check-out, patientrates hermood at a 8on a scale of 1-10 with 10 being great.Pt states afternoon plans of spending time outside. Patient demonstrates someprogress as evidenced by increased mood.Patient denies SI/HI/self-harm at the end of group.    Suicidal/Homicidal: Nowithout intent/plan  Plan: Pt will discharge from PHP due to meeting treatment goals of decreased depression and anxiety symptoms, increased ability to manage symptoms in a healthy manner, and increased daily functioning. Pt will step down to IOP within this agency to continue treatment. Pt and provider are aligned with discharge plans. Pt denies SI/HI at time of discharge.   Diagnosis: MDD (major depressive disorder), recurrent severe, without psychosis (HCC) [F33.2]    1. MDD (major depressive disorder), recurrent severe, without psychosis (HCC)       [name], LCSW 06/07/2019

## 2019-06-08 ENCOUNTER — Ambulatory Visit (HOSPITAL_COMMUNITY): Payer: No Typology Code available for payment source

## 2019-06-08 ENCOUNTER — Encounter: Payer: Self-pay | Admitting: *Deleted

## 2019-06-08 NOTE — Progress Notes (Signed)
Virtual Visit via Video Note  I connected with Kristen [name] on 05/31/19 at  9:00 AM EDT by a video enabled telemedicine application and verified that I am speaking with the correct person using two identifiers.   I discussed the limitations of evaluation and management by telemedicine and the availability of in person appointments. The patient expressed understanding and agreed to proceed.  I discussed the assessment and treatment plan with the patient. The patient was provided an opportunity to ask questions and all were answered. The patient agreed with the plan and demonstrated an understanding of the instructions.   The patient was advised to call back or seek an in-person evaluation if the symptoms worsen or if the condition fails to improve as anticipated.  I provided 180 minutes of non-face-to-face time during this encounter.   [name], LCSW   Daily Group Progress Note  Program: IOP  Group Time: 9:00 - 10:30  Participation Level: Active  Behavioral Response: Appropriate  Type of Therapy:  Group Therapy   Topic: Clinician led check-in regarding current stressors and situation, and review of patient completed daily inventory. Clinician utilized active listening and empathetic response and validated patient emotions. Clinician facilitated processing group on pertinent issues.   Summary of Progress: Patient arrived within time allowed and reports that she is feeling "not good." Patient rates hermood at [facility] a scale of 1-10 with 10 being great. Pt reports her weekend went well and she continues to struggle with allergies which affect her energy levels. Pt states she is "tired all the time" and napped throughout the weekend. Pt is able to process. Patient engaged in discussion. Pt denies current SI/HI/self-harm thoughts.     Group Time: 10:30 - 12:00  Participation Level:  Active  Behavioral Response: Appropriate  Type of Therapy: Group Therapy   Topic: Cln  led discussion on loneliness and the effects it has on [place]. Group viewed TED talk "how to practice emotional first aid" and had discussion on how to "take action when feeling lonely." Group members shared the ways in which loneliness is and has affected them. Group supported one another in coming up with possible solutions for "taking action."   Summary of Progress:  Pt engaged in discussion and states loneliness is an issue for her. Pt is able to process and states making efforts to reach out to friends more can be one step she can take to help decrease loneliness.    [name], LCSW

## 2019-06-09 ENCOUNTER — Ambulatory Visit (HOSPITAL_COMMUNITY): Payer: No Typology Code available for payment source

## 2019-06-10 ENCOUNTER — Ambulatory Visit (HOSPITAL_COMMUNITY): Payer: No Typology Code available for payment source

## 2019-06-11 ENCOUNTER — Ambulatory Visit (HOSPITAL_COMMUNITY): Payer: No Typology Code available for payment source

## 2019-06-11 ENCOUNTER — Encounter (HOSPITAL_COMMUNITY): Payer: Self-pay

## 2019-06-11 DIAGNOSIS — F332 Major depressive disorder, recurrent severe without psychotic features: Secondary | ICD-10-CM

## 2019-06-13 NOTE — BH Assessment (Signed)
Q-Actual   Writer attempted to contact the participant in order to complete a PHQ 2-9.  Writer was unsuccessful in reaching the patient.  Writer left a voice mail message.     

## 2019-06-22 NOTE — Psych (Signed)
Virtual Visit via Video Note  I connected with Waynette Buttery on 05/14/19 at  9:00 AM EDT by a video enabled telemedicine application and verified that I am speaking with the correct person using two identifiers.   I discussed the limitations of evaluation and management by telemedicine and the availability of in person appointments. The patient expressed understanding and agreed to proceed.  I discussed the assessment and treatment plan with the patient. The patient was provided an opportunity to ask questions and all were answered. The patient agreed with the plan and demonstrated an understanding of the instructions.   The patient was advised to call back or seek an in-person evaluation if the symptoms worsen or if the condition fails to improve as anticipated.  Pt was provided 240 minutes of non-face-to-face time during this encounter.   Royetta Crochet, [facility]    [facility] PHP THERAPIST PROGRESS NOTE  SHAQUEL JOSEPHSON [id number]  Session Time: 9:00 - 10:00  Participation Level: Active  Behavioral Response: CasualAlertDepressed  Type of Therapy: Group Therapy  Treatment Goals addressed: Coping  Interventions: CBT, DBT, Supportive and Reframing  Summary: Clinician led check-in regarding current stressors and situation, and review of patient completed daily inventory. Clinician utilized active listening and empathetic response and validated patient emotions. Clinician facilitated processing group on pertinent issues.   Therapist Response:Montoya Darreld Mclean Morford is a 34 y.o. female who presents with depression and anxiety symptoms. Patient arrived within time allowed and reports that she is feeling "okay." Patient rates her mood at a 6 on a scale of 1-10 with 10 being great. Pt reports her mother's procedure went well which has helped reduce stress. Pt reports she slept well, too. She participated in some self-care activities including playing video games, doodling, and watching  tictoc videos. Pt reports continued stress with work and what the future looks like with a job. Pt is able to process. Patient engaged in discussion.           Session Time: 10:00 -11:00   Participation Level: Active   Behavioral Response: CasualAlertDepressed   Type of Therapy: Group Therapy   Treatment Goals addressed: Coping   Interventions: CBT, DBT, Solution Focused, Supportive and Reframing   Summary: Cln introduced topic of triggers. Cln discussed the importance of knowing triggers and what skills to use when triggered or prevent getting worked up.     Therapist Response: Pt engaged in discussion. Pt reports it can be easier to run away from problems than face them enough to identify triggers.              Session Time: 11:00- 12:00   Participation Level: Active   Behavioral Response: CasualAlertDepressed   Type of Therapy: Group Therapy, OT   Treatment Goals addressed: Coping   Interventions: Psychosocial skills training, Supportive   Summary: Occupational Therapy group   Therapist Response: Patient engaged in group. See OT note.            Session Time: 12:00 -1:00   Participation Level: Active   Behavioral Response: CasualAlertDepressed   Type of Therapy: Group therapy   Treatment Goals addressed: Coping   Interventions: CBT; Solution focused; Supportive; Reframing   Summary: 12:00 - 12:50: Clinician introduced topic of "Positive Psychology". Group watched "Positive Psychology" Ted-Talk. Patients discussed how their "lens" of life effects the way they feel. Group discussed 5 strategies to help change lens. Patients identified one strategy they would be willing to try to change their "lens" for at least 21  days to create a new habit. 12:50 -1:00 Clinician led check-out. Clinician assessed for immediate needs, medication compliance and efficacy, and safety concerns   Therapist Response: 12:00 - 12:50: Pt engaged in discussion and reports  journaling could be helpful for her to change her lens.  12:50 - 1:00: At check-out, patient rates her mood at a 7 on a scale of 1-10 with 10 being great. Pt states afternoon plans of walking in the park and getting some fresh air. Patient demonstrates some progress as evidenced by increased efforts to participate in self-care. Patient denies SI/HI/self-harm at the end of group.    Suicidal/Homicidal: Nowithout intent/plan  Plan: Pt will continue in PHP while working to decrease depression and anxiety symptoms, increase ability to manage symptoms in a healthy manner, and increase daily functioning.   Diagnosis: MDD (major depressive disorder), recurrent severe, without psychosis (HCC) [F33.2]    1. MDD (major depressive disorder), recurrent severe, without psychosis (HCC)       Quinn Axe, [facility] 05/14/2019

## 2019-06-24 ENCOUNTER — Encounter (HOSPITAL_COMMUNITY): Payer: Self-pay

## 2019-06-24 DIAGNOSIS — F411 Generalized anxiety disorder: Secondary | ICD-10-CM

## 2019-06-24 NOTE — BH Assessment (Signed)
Q-actual 2-wk PHQ 2-9   PHQ 2 is 6 PHQ 9 is 18

## 2019-06-25 ENCOUNTER — Ambulatory Visit: Payer: Self-pay | Attending: Internal Medicine | Admitting: Family

## 2019-06-25 ENCOUNTER — Other Ambulatory Visit: Payer: Self-pay

## 2019-06-25 DIAGNOSIS — F332 Major depressive disorder, recurrent severe without psychotic features: Secondary | ICD-10-CM

## 2019-06-25 DIAGNOSIS — F419 Anxiety disorder, unspecified: Secondary | ICD-10-CM

## 2019-06-25 DIAGNOSIS — F329 Major depressive disorder, single episode, unspecified: Secondary | ICD-10-CM

## 2019-06-25 MED ORDER — PROPRANOLOL HCL 20 MG PO TABS: 20.0000 mg | ORAL_TABLET | Freq: Two times a day (BID) | ORAL | 3 refills | Status: DC

## 2019-06-25 MED ORDER — TRAZODONE HCL 50 MG PO TABS: 50.0000 mg | ORAL_TABLET | Freq: Every evening | ORAL | 3 refills | Status: DC | PRN

## 2019-06-25 MED ORDER — BUSPIRONE HCL 10 MG PO TABS: 10.0000 mg | ORAL_TABLET | Freq: Two times a day (BID) | ORAL | 3 refills | Status: DC

## 2019-06-25 MED ORDER — HYDROXYZINE HCL 25 MG PO TABS: 25.0000 mg | ORAL_TABLET | Freq: Three times a day (TID) | ORAL | 3 refills | Status: DC | PRN

## 2019-06-25 MED ORDER — SERTRALINE HCL 50 MG PO TABS: 50.0000 mg | ORAL_TABLET | Freq: Every day | ORAL | 3 refills | Status: DC

## 2019-06-25 MED FILL — SERTRALINE HCL 50 MG TABS: 50 | 30 days supply | Qty: 30 | Fill #0

## 2019-06-25 NOTE — Progress Notes (Signed)
Virtual Visit via Telephone Note I connected with Kristen [name], on 06/25/2019 at 10:37 AM by telephone due to the COVID-19 pandemic and verified that I am speaking with the correct person using two identifiers.  Due to current restrictions/limitations of in-office visits due to the COVID-19 pandemic, this scheduled clinical appointment was converted to a telehealth visit.   Consent: I discussed the limitations, risks, security and privacy concerns of performing an evaluation and management service by telephone and the availability of in person appointments. I also discussed with the patient that there may be a patient responsible charge related to this service. The patient expressed understanding and agreed to proceed.  Location of Patient: Home  Location of Provider: [facility]   Persons participating in Telemedicine visit: Skii Cleland, NP Laruth Bouchard, CMA  History of Present Illness: Kristen [name] is a 34 year old Caucasian female with history of chronic migraine with aura, essential hypertension, asthma severe persistent, mild intermittent asthma without complication, OSA on CPAP, irritable bowel syndrome with diarrhea, PCOS, lymphocytic hypophysitis, scoliosis, intertrigo, social phobia, seasonal allergies, ADD, dyslexia, restless legs, vulvar lesion, anxiety and depression, major depressive disorder recurrent severe without psychosis, and abnormal serum thyroid stimulating hormone who presents today for anxiety and depression follow-up.  1. ANXIETY AND DEPRESSION FOLLOW-UP:  Reports her anxiety and depression continues to be related to unemployment, attempting to find new employment, finances, and family/home life.    Reports she is the caregiver for both her mom and dad which is causing increased anxiety and depression. Reports her mother has health problems and her dad has what she thinks is early dementia. Reports that she is  always taking care of her parents and other people and never able to take of herself.  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 Reports [employer] was unable to approve reasonable work accommodation request from her primary physician. 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 Today reports that she feels she would not be able to work anywhere even with reasonable work accommodation as her anxiety and depression are not well-managed.  States she has been unemployed for 1 month and that [employer] agreed to put her on a temporary leave status in order to allow her time to find a new job in the [employer] system.  Reports since becoming unemployed she has lost health insurance and as a result has been unable to see her psychiatrist for counseling and medication management. Reports she has been stretching her medication by taking it every other day or splitting doses so that it will last longer. Reports she has applied for disability but that it has been a slow-moving process for approval. Report she received information from [employer] about applying for the [employer] Each Other [employer] which she intends to submit soon.  Today denies suicidal ideation and self-harm. Reports she thinks about hurting herself sometimes but that she doesn't have any true plans to actually do it.   GAD 7 : Generalized Anxiety Score 06/25/2019 04/23/2019 03/12/2019 03/10/2019  Nervous, Anxious, on Edge 3 3 3 3   Control/stop worrying 3 3 3 3   Worry too much - different things 3 3 3 3   Trouble relaxing 3 3 3 1   Restless 3 3  0 0  Easily annoyed or irritable 3 3 3 2   Afraid - awful might happen 3 3 3 3   Total GAD 7 Score 21 21 18 15     Depression screen [facility] 2/9 06/25/2019 04/23/2019 03/12/2019   Decreased Interest 3 3 3   Down, Depressed, Hopeless 3 3 3   PHQ - 2 Score 6 6 6   Altered sleeping 2 3 0  Tired, decreased energy 3 3 3   Change in appetite 2 0 0  Feeling bad or failure about yourself  3 3 3   Trouble concentrating 1 0 3  Moving slowly or fidgety/restless 3 3 3   Suicidal thoughts 2 1 3   PHQ-9 Score 22 19 21   Some encounter information is confidential and restricted. Go to Review Flowsheets activity to see all data.  Some recent data might be hidden   Past Medical History:  Diagnosis Date  . Anxiety 2013   treated at [facility] by 05/10/2019   . Asthma 1989    no hospitalization, or intubation, previously on advair and singulair. asthma worsening.   . 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 Depression 2001   depressed since childhood   . 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 Essential hypertension 03/12/2019  . Kyphosis of thoracic region 2004    painful, runs in dad side   . Migraine   . Pancreatitis 2003   gallstone pancreatitis   . Pancreatitis due to biliary obstruction 2003  . PCOS (polycystic ovarian syndrome) 2014   facial hair, irregular periods, never been pregnant    Allergies  Allergen Reactions  . Mucinex [Guaifenesin Er] Hives, Itching and Swelling  . Penicillins Swelling    Did it involve swelling of the face/tongue/throat, SOB, or low BP? Yes Did it involve sudden or severe rash/hives, skin peeling, or any reaction on the inside of your mouth or nose? No Did you need to seek medical attention at a hospital or doctor's office? Yes When did it last happen?>10 years ago If all above answers are "NO", may proceed with cephalosporin use.   . Contrast Media [Iodinated Diagnostic Agents] Nausea And Vomiting  . Multihance [Gadobenate] Nausea And Vomiting    Current Outpatient Medications on File Prior to Visit  Medication Sig Dispense Refill  . albuterol (VENTOLIN HFA) 108 (90 Base) MCG/ACT inhaler Inhale 2 puffs into the lungs  every 6 (six) hours as needed for wheezing or shortness of breath. 6.7 g 3  . busPIRone (BUSPAR) 10 MG tablet Take 1 tablet (10 mg total) by mouth 2 (two) times daily. For anxiety 60 tablet 0  . hydrOXYzine (ATARAX/VISTARIL) 25 MG tablet Take 1 tablet (25 mg total) by mouth 3 (three) times daily as needed for anxiety. 75 tablet 0  . naproxen (NAPROSYN) 500 MG tablet Take 1 tablet (500 mg total) by mouth 2 (two) times daily with a meal. (May buy from over the counter): For pain    . propranolol (INDERAL) 20 MG tablet Take 1 tablet (20 mg total) by mouth 2 (two) times daily. For anxiety 60 tablet 0  . sertraline (ZOLOFT) 50 MG tablet Take 1 tablet (50 mg total) by mouth daily. For depression 30 tablet 0  . traZODone (DESYREL) 50 MG tablet Take 1 tablet (50 mg total) by mouth at bedtime as needed for sleep. 30 tablet 0   No current facility-administered medications on file prior to visit.    Observations/Objective: Alert and oriented x 3. Not  in acute distress. Physical examination not completed as this is a telemedicine visit.  Assessment and Plan: 1. MDD (major depressive disorder), recurrent severe, without psychosis (HCC): -Continue Sertraline for depression. -Patient has been unable to see psychiatrist for counseling and medication management since lapse of her health insurance. [facility] [employer] [place] financial discount/orange card application as a supplemental option. Patient agreeable -Referral placed to social work for counseling resources. -Follow-up with primary physician as needed. - sertraline (ZOLOFT) 50 MG tablet; Take 1 tablet (50 mg total) by mouth daily. For depression  Dispense: 30 tablet; Refill: 3 - Ambulatory referral to Social Work  2. Anxiety and depression: -Continue Buspirone, Hydroxyzine, Propranolol, and Trazodone for anxiety and depression. -Patient has been unable to see psychiatrist for counseling and medication management since lapse of her health  insurance. [facility] [employer] [place] financial discount/orange card application as a supplemental option. Patient agreeable -Referral placed to social work for counseling services. -Follow-up with primary physician as needed.  - busPIRone (BUSPAR) 10 MG tablet; Take 1 tablet (10 mg total) by mouth 2 (two) times daily. For anxiety  Dispense: 60 tablet; Refill: 3 - hydrOXYzine (ATARAX/VISTARIL) 25 MG tablet; Take 1 tablet (25 mg total) by mouth 3 (three) times daily as needed for anxiety.  Dispense: 90 tablet; Refill: 3 - propranolol (INDERAL) 20 MG tablet; Take 1 tablet (20 mg total) by mouth 2 (two) times daily. For anxiety  Dispense: 60 tablet; Refill: 3 - traZODone (DESYREL) 50 MG tablet; Take 1 tablet (50 mg total) by mouth at bedtime as needed for sleep.  Dispense: 30 tablet; Refill: 3 - Ambulatory referral to Social Work -Set designer Resources:   What if I or someone I know is in crisis?  . If you are thinking about harming yourself or having thoughts of suicide, or if you know someone who is, seek help right away.  . Call your doctor or mental health care provider.  . Call 911 or go to a hospital emergency room to get immediate help, or ask a friend or family member to help you do these things.  . Call the [place] National Suicide Prevention Lifeline's toll-free, 24-hour hotline at 1-800-273-TALK 408-021-7625) or TTY: 1-800-799-4 TTY 442-207-6319) to talk to a trained counselor.  . If you are in crisis, make sure you are not left alone.   . If someone else is in crisis, make sure he or she is not left alone   24 Hour :   [place] National Suicide Hotline: 903 026 1155  Therapeutic Alternative Mobile Crisis: 301-028-3463   [facility]  [address] [place], [place] [address]  [phone] or [phone]  Family Service of the [employer] (Domestic Violence, Rape & Victim Assistance)  [phone]  [facility]   [address], [place]  [address]   [phone] or [phone]   [facility] [employer] Crisis Services: [phone] (8am-4pm) or [phone] (after hours)    [facility], [address], [place], [place]  [phone] Fax: [phone] [email or web address]  *Interpreters available *Accepts Medicaid, Medicare, uninsured  [facility]   Mon-Fri: 8am-5pm [address] [place], [place] [phone](GBMSX); [phone](fax) [email or web address]://[email or web address]  *Accepts Medicare  [facility] [place], Fri: 8am-4pm [address] [place], [place]  [phone] (phone); [phone] (fax) ExShows.dk  *Accepts Medicare  [facility] Mon-Fri: 9am-5pm  [address], [place], [place] [phone] (phone); [phone]  MommyCollege.dk  *Accepts Medicaid  [facility]  [address], [place], [place]  [phone]  [email or web address]   [facility] Mon-Fri, 8:30am-12pm/1pm-2:30pm [address], [place], [place] [phone] (phone); [phone] (fax) www.fspcares.org  *Accepts Medicaid, sliding-scale*Bilingual services available  [facility] Mon-Fri, 8am-7pm [address], [place], [place]  [phone]([phone](phone); [phone](fax) www.famsolutions.org  *Accepts Medicaid *Bilingual services available  [facility] Mon-Fri: 8am-5pm, Saturday by appointment only [address], [place], [place] [phone] (phone); [phone] (fax) www.[email or web address]   [facility] [address], [place], [place] [phone] www.[email or web address]  *Free & reduced services for uninsured and underinsured individuals *Bilingual services for  Spanish-speaking clients 21 and under  [facility], [address], [place], [place] [phone]([phone](phone); [phone](fax) KittenExchange.[email or web address].[email or web address]  *Bring your own interpreter at first visit *Accepts Medicare and [facility]icaid  [facility] Mon-Fri: 9am-5:30pm [address], [place], [place] [phone] (phone), [phone] (fax) After hours crisis line: [phone] www.neuropsychcarecenter.com  *Accepts Medicare and Medicaid  [facility] Mon-Thurs, 8am-6pm [address], [place], [place]  [phone] (phone); [phone] (fax) http://[email or web address]  *Subsidized costs available  [facility] Mon-Fri: 8am-4pm [address], [place], [place] [phone]([phone](phone); [phone](fax) www.psychotherapeuticservices.com  *Accepts Medicaid  [facility] [place] [facility] Same day access hours: Mon-Fri, 8:30-3pm Crisis hours: Mon-Fri, 8am-5pm [address], [place], [place]  [facility] Same day access hours: Mon-Fri, 8:30-3pm Crisis hours: Mon-Fri, 8am-8pm [address], [place], [place] [phone] (phone); [phone] (fax) www.[email or web address]  *Accepts Medicaid and Medicare  [facility] Mon, [place], Fri: 9am-9pm Tues, Thurs: 9am-6pm [address], [place], [place]  [phone] (phone); [phone] (fax) https://[email or web address]  *(Accepts Medicare and Medicaid; payment plans available)*Bilingual services available  [facility] [address], [place], [place] [phone] (phone); [phone] (fax) www.santecounseling.com   [facility] [address], [place], [place]  [phone]  RackRewards.frwww.[email or web address]  *Bilingual services available  [facility]) Mon-Thurs: 8am-8pm [address], [place],  [place] [phone] (phone); [phone] (fax) ScrapbookLive.[email or web address]://[email or web address]  *Accepts Medicaid*Bilingual services available  [facility] [address], [place], [place] [phone] (phone) [email or web address]  *Accepts Medicaid *Bilingual services available  [facility] Mon-Fri, 9am-4:45pm [address], [place], [place] [phone] (phone); [phone] (fax) http://[email or web address]-[email or web address]  *Accepts Medicare  [facility] Mon-Thurs: 8:30-8pm, Fri: 8:30am-7pm [address], [place], [place] (3rd floor) [phone] (phone); [phone] (fax) [email or web address]://[email or web address]  *Accepts Medicaid; income-based reduced rates available  [facility] Mon-Fri: 8am-5pm [address], [place], [place] [phone] (phone); [phone] (fax) http://www.[email or web address]  *Accepts Medicaid*Bilingual services available  [facility] [address], [address], [place], [place]  [phone] (phone); [phone] (fax) www.youthfocus.org  *Free emergency housing and clinical services for youth in crisis  [facility] ([facility] [place])  [address], [place] [phone] www.mhag.org  *Provides direct services to individuals in recovery from mental illness, including support groups, recovery skills classes, and one on one peer support  [employer](National Alliance on Mental Illness) [place] NAMI helpline: 601-020-1595239-541-9153  https://namiguilford.org  *A community hub for information relating to local resources and services for the friends and families of individuals living alongside a mental health condition, as well as the individuals themselves. Classes and support groups also provided   Follow Up Instructions: Patient was given clear instructions to go to Emergency Department or return to medical center if symptoms don't improve, worsen, or new problems develop.The  patient verbalized understanding.  I discussed the assessment and treatment plan with the patient. The  patient was provided an opportunity to ask questions and all were answered. The patient agreed with the plan and demonstrated an understanding of the instructions.   The patient was advised to call back or seek an in-person evaluation if the symptoms worsen or if the condition fails to improve as anticipated.  I provided 20 minutes total of non-face-to-face time during this encounter including median intraservice time, reviewing previous notes, labs, imaging, medications, management and patient verbalized understanding.  Camillia Herter, NP  [facility] [place], [place]   06/25/2019, 10:37 AM

## 2019-06-25 NOTE — Patient Instructions (Signed)
Continue Zoloft, Trazodone, Buspar, Hydroxyzine, and Propranolol as prescribed. Referral to Social Work. Apply for [facility] financial discount/orange card. Follow-up with primary physician as needed. Major Depressive Disorder, Adult Major depressive disorder (MDD) is a mental health condition. MDD often makes you feel sad, hopeless, or helpless. MDD can also cause symptoms in your body. MDD can affect your:  Work.  School.  Relationships.  Other normal activities. MDD can range from mild to very bad. It may occur once (single episode MDD). It can also occur many times (recurrent MDD). The main symptoms of MDD often include:  Feeling sad, depressed, or irritable most of the time.  Loss of interest. MDD symptoms also include:  Sleeping too much or too little.  Eating too much or too little.  A change in your weight.  Feeling tired (fatigue) or having low energy.  Feeling worthless.  Feeling guilty.  Trouble making decisions.  Trouble thinking clearly.  Thoughts of suicide or harming others.  Feeling weak.  Feeling agitated.  Keeping yourself from being around other people (isolation). Follow these instructions at home: Activity  Do these things as told by your doctor: ? Go back to your normal activities. ? Exercise regularly. ? Spend time outdoors. Alcohol  Talk with your doctor about how alcohol can affect your antidepressant medicines.  Do not drink alcohol. Or, limit how much alcohol you drink. ? This means no more than 1 drink a day for nonpregnant women and 2 drinks a day for men. One drink equals one of these:  12 oz of beer.  5 oz of wine.  1 oz of hard liquor. General instructions  Take over-the-counter and prescription medicines only as told by your doctor.  Eat a healthy diet.  Get plenty of sleep.  Find activities that you enjoy. Make time to do them.  Think about joining a support group. Your doctor may be able to suggest a group  for you.  Keep all follow-up visits as told by your doctor. This is important. Where to find more information:  The [employer] on Mental Illness: ? www.nami.org  U.S. [employer] of Mental Health: ? http://www.maynard.net/  National Suicide Prevention Lifeline: ? (857) 223-5639. This is free, 24-hour help. Contact a doctor if:  Your symptoms get worse.  You have new symptoms. Get help right away if:  You self-harm.  You see, hear, taste, smell, or feel things that are not present (hallucinate). If you ever feel like you may hurt yourself or others, or have thoughts about taking your own life, get help right away. You can go to your nearest emergency department or call:  Your local emergency services (911 in the U.S.).  A suicide crisis helpline, such as the National Suicide Prevention Lifeline: ? 8280680602. This is open 24 hours a day. This information is not intended to replace advice given to you by your health care provider. Make sure you discuss any questions you have with your health care provider. Document Revised: 01/03/2017 Document Reviewed: 10/08/2015 Elsevier Patient Education  2020 [employer].

## 2019-07-08 ENCOUNTER — Ambulatory Visit: Payer: Self-pay | Attending: Internal Medicine | Admitting: Licensed Clinical Social Worker

## 2019-07-08 ENCOUNTER — Other Ambulatory Visit: Payer: Self-pay

## 2019-07-08 DIAGNOSIS — F419 Anxiety disorder, unspecified: Secondary | ICD-10-CM

## 2019-07-08 DIAGNOSIS — F322 Major depressive disorder, single episode, severe without psychotic features: Secondary | ICD-10-CM

## 2019-07-16 NOTE — BH Specialist Note (Signed)
Integrated Behavioral Health Visit via Telemedicine (Telephone)  07/08/2019 Kristen [name] [id number]   Session Start time: 2:30 PM  Session End time: 2:45 PM Total time: 15  Referring Provider: Dr. Laural Benes Type of Visit: Telephonic Patient location: Home [facility] Provider location: Office All persons participating in visit: LCSW inpatient  Confirmed patient's address: Yes  Confirmed patient's phone number: Yes  Any changes to demographics: No   Confirmed patient's insurance: Yes  Any changes to patient's insurance: No   Discussed confidentiality: Yes    The following statements were read to the patient and/or legal guardian that are established with the [facility] Provider.  "The purpose of this phone visit is to provide behavioral health care while limiting exposure to the coronavirus (COVID19).  There is a possibility of technology failure and discussed alternative modes of communication if that failure occurs."  "By engaging in this telephone visit, you consent to the provision of healthcare.  Additionally, you authorize for your insurance to be billed for the services provided during this telephone visit."   Patient and/or legal guardian consented to telephone visit: Yes   PRESENTING CONCERNS: Patient and/or family reports the following symptoms/concerns: Patient reports difficulty managing depression anxiety symptoms.  Patient shared that she recently lost insurance and feels that she is not able to work at this time due to to mental health conditions.  Patient would like to complete neurocognitive testing recommended by previous psychiatrist Duration of problem: Ongoing; Severity of problem: severe  STRENGTHS (Protective Factors/Coping Skills): Patient is interested in services Patient has good insight  GOALS ADDRESSED: Patient will: 1.  Reduce symptoms of: anxiety and depression  2.  Increase knowledge and/or ability of: coping skills and healthy habits  3.   Demonstrate ability to: Increase healthy adjustment to current life circumstances and Increase adequate support systems for patient/family  INTERVENTIONS: Interventions utilized:  Solution-Focused Strategies and Supportive Counseling Standardized Assessments completed: Not Needed  ASSESSMENT: Patient currently experiencing difficulty managing mental health conditions triggered by psychosocial stressors.   Patient may benefit from medication management and therapy.  LCSW strongly encourage patient to complete financial counseling to assist with medical costs and referrals.  Therapeutic strategies discussed to assist patient with decreased and/or management of symptoms.  Patient is aware of crisis intervention resources.  PLAN: 1. Follow up with behavioral health clinician on : 07/22/2019 2. Behavioral recommendations: Utilize strategies and resources discussed 3. Referral(s): Integrated [employer] (In Clinic)  Bridgett Larsson, [place] 07/16/2019 10:20 PM

## 2019-07-22 ENCOUNTER — Other Ambulatory Visit: Payer: Self-pay

## 2019-07-22 ENCOUNTER — Ambulatory Visit: Payer: Self-pay | Attending: Internal Medicine | Admitting: Licensed Clinical Social Worker

## 2019-07-22 DIAGNOSIS — F411 Generalized anxiety disorder: Secondary | ICD-10-CM

## 2019-07-22 DIAGNOSIS — F332 Major depressive disorder, recurrent severe without psychotic features: Secondary | ICD-10-CM

## 2019-08-02 ENCOUNTER — Encounter (HOSPITAL_COMMUNITY): Payer: Self-pay

## 2019-08-02 DIAGNOSIS — F332 Major depressive disorder, recurrent severe without psychotic features: Secondary | ICD-10-CM

## 2019-08-02 NOTE — BH Assessment (Signed)
Q-Actual   Writer attempted to contact the participant in order to complete a PHQ 2-9.  Writer was unsuccessful in reaching the patient and left a voice mail message.     

## 2019-08-04 ENCOUNTER — Ambulatory Visit: Payer: Self-pay | Attending: Internal Medicine

## 2019-08-04 ENCOUNTER — Other Ambulatory Visit: Payer: Self-pay

## 2019-08-04 NOTE — BH Specialist Note (Signed)
Integrated Behavioral Health Visit via Telemedicine (Telephone)   Kristen [name] [id number]   Session Start time: 12:20  Session End time: 12:55 PM Total time: 25   Referring Provider: Dr. Laural Benes Type of Visit: Telephonic Patient location: Home [facility] Provider location: Office All persons participating in visit: LCSW and Patient  Confirmed patient's address: Yes  Confirmed patient's phone number: Yes  Any changes to demographics: No   Confirmed patient's insurance: Yes  Any changes to patient's insurance: No   Discussed confidentiality: Yes    The following statements were read to the patient and/or legal guardian that are established with the [facility] Provider.  "The purpose of this phone visit is to provide behavioral health care while limiting exposure to the coronavirus (COVID19).  There is a possibility of technology failure and discussed alternative modes of communication if that failure occurs."  "By engaging in this telephone visit, you consent to the provision of healthcare.  Additionally, you authorize for your insurance to be billed for the services provided during this telephone visit."   Patient and/or legal guardian consented to telephone visit: Yes   PRESENTING CONCERNS: Patient and/or family reports the following symptoms/concerns: Patient reports increase in anxiety and mild panic attacks triggered by stress denial of disability claim.  Patient endorses decrease in suicidal ideations with medication management however reports decreased motivation in caring for self (hygiene) Duration of problem: Ongoing; Severity of problem: moderate  STRENGTHS (Protective Factors/Coping Skills): Patient is participating in medication management Patient has good insight Patient has desire to change  GOALS ADDRESSED: Patient will: 1.  Reduce symptoms of: anxiety and depression  2.  Increase knowledge and/or ability of: self-management skills  3.  Demonstrate  ability to: Increase healthy adjustment to current life circumstances and Increase adequate support systems for patient/family  INTERVENTIONS: Interventions utilized:  Behavioral Activation and Supportive Counseling Standardized Assessments completed: Not Needed  ASSESSMENT: Patient currently experiencing increase in anxiety and mild panic attacks triggered by stress and denial of disability claim.  Patient endorses decrease in suicidal ideations with medication management however reports decreased motivation in caring for self (hygiene).   Patient may benefit from continued medication management.  Patient reports dismissal from mental health program after loss of insurance.  LCSW provided patient with additional support resources to assist with management of mental health conditions.    Patient was strongly encouraged to complete financial application to provide assistance with medical costs.  Patient provided verbal consent for LCSW to complete legal aid referral regarding disability appeal  PLAN: 1. Follow up with behavioral health clinician on : 08/12/2019 2. Behavioral recommendations: Utilize strategies discussed and resources provided 3. Referral(s): Integrated Art gallery manager (In Clinic), Community Mental Health Services (LME/Outside Clinic) and Community Resources:  Finances  Bridgett Larsson, [place] 08/04/2019 4:36 PM

## 2019-08-05 ENCOUNTER — Ambulatory Visit: Payer: Self-pay | Admitting: Licensed Clinical Social Worker

## 2019-08-12 ENCOUNTER — Ambulatory Visit: Payer: Self-pay | Admitting: Licensed Clinical Social Worker

## 2019-08-12 ENCOUNTER — Telehealth: Payer: Self-pay | Admitting: Licensed Clinical Social Worker

## 2019-08-12 NOTE — Telephone Encounter (Signed)
Call placed to patient regarding scheduled IBH follow up appointment. LCSW left message requesting a return call.  

## 2019-08-13 ENCOUNTER — Encounter: Payer: Self-pay | Admitting: Internal Medicine

## 2019-08-13 ENCOUNTER — Other Ambulatory Visit: Payer: Self-pay | Admitting: Pharmacist

## 2019-08-13 DIAGNOSIS — F332 Major depressive disorder, recurrent severe without psychotic features: Secondary | ICD-10-CM

## 2019-08-13 DIAGNOSIS — F419 Anxiety disorder, unspecified: Secondary | ICD-10-CM

## 2019-08-13 MED ORDER — PROPRANOLOL HCL 20 MG PO TABS: 20.0000 mg | ORAL_TABLET | Freq: Two times a day (BID) | ORAL | 1 refills | Status: DC

## 2019-08-13 MED ORDER — HYDROXYZINE HCL 25 MG PO TABS: 25.0000 mg | ORAL_TABLET | Freq: Three times a day (TID) | ORAL | 1 refills | Status: DC | PRN

## 2019-08-13 MED ORDER — TRAZODONE HCL 50 MG PO TABS: 50.0000 mg | ORAL_TABLET | Freq: Every evening | ORAL | 1 refills | Status: DC | PRN

## 2019-08-13 MED ORDER — NAPROXEN 500 MG PO TABS: 500.0000 mg | ORAL_TABLET | Freq: Two times a day (BID) | ORAL | 0 refills | Status: DC

## 2019-08-13 MED ORDER — BUSPIRONE HCL 10 MG PO TABS: 10.0000 mg | ORAL_TABLET | Freq: Two times a day (BID) | ORAL | 1 refills | Status: DC

## 2019-08-13 MED ORDER — SERTRALINE HCL 50 MG PO TABS: 50.0000 mg | ORAL_TABLET | Freq: Every day | ORAL | 1 refills | Status: DC

## 2019-08-16 ENCOUNTER — Telehealth: Payer: Self-pay | Admitting: Internal Medicine

## 2019-08-16 NOTE — Telephone Encounter (Signed)
I call the Pt since the documents for the taxes that we need to complete her financial application is not in the documents, we need the 1040 Federal taxes form all the pages

## 2019-08-18 ENCOUNTER — Telehealth: Payer: Self-pay | Admitting: Internal Medicine

## 2019-08-18 NOTE — Telephone Encounter (Signed)
I called the Pt to informed her that the documents she brought to the office are incorrect, I need the 1040 Forms all the pages as well the 400 form all the pages from the 2020 taxes

## 2019-09-05 ENCOUNTER — Encounter (HOSPITAL_COMMUNITY): Payer: Self-pay | Admitting: Internal Medicine

## 2019-09-05 DIAGNOSIS — F411 Generalized anxiety disorder: Secondary | ICD-10-CM

## 2019-09-05 NOTE — BH Assessment (Signed)
Q-actual 2-wk PHQ 2-9   PHQ 2 is 5 PHQ 9 is Research officer, trade union provided resources for facilities that will provide mental health services to her since she does not have insurance.

## 2019-09-06 MED FILL — ?NAPROXEN 500 MG TABS: 500 | 30 days supply | Qty: 60 | Fill #0

## 2019-09-06 MED FILL — ?PROPRANOLOL 20 MG TABLET: 20 | 30 days supply | Qty: 60 | Fill #0

## 2019-09-06 MED FILL — SERTRALINE HCL 50 MG TABLET: 50 | 30 days supply | Qty: 30 | Fill #0

## 2019-09-06 MED FILL — busPIRone HCL 10 MG TABS: 10 | 30 days supply | Qty: 60 | Fill #0

## 2019-09-20 ENCOUNTER — Encounter (HOSPITAL_COMMUNITY): Payer: Self-pay | Admitting: Psychiatry

## 2019-09-20 ENCOUNTER — Telehealth (INDEPENDENT_AMBULATORY_CARE_PROVIDER_SITE_OTHER): Payer: No Payment, Other | Admitting: Psychiatry

## 2019-09-20 ENCOUNTER — Other Ambulatory Visit: Payer: Self-pay

## 2019-09-20 DIAGNOSIS — F4310 Post-traumatic stress disorder, unspecified: Secondary | ICD-10-CM

## 2019-09-20 DIAGNOSIS — F419 Anxiety disorder, unspecified: Secondary | ICD-10-CM | POA: Diagnosis not present

## 2019-09-20 DIAGNOSIS — F333 Major depressive disorder, recurrent, severe with psychotic symptoms: Secondary | ICD-10-CM | POA: Diagnosis not present

## 2019-09-20 DIAGNOSIS — F4010 Social phobia, unspecified: Secondary | ICD-10-CM

## 2019-09-20 DIAGNOSIS — F329 Major depressive disorder, single episode, unspecified: Secondary | ICD-10-CM

## 2019-09-20 MED ORDER — BUSPIRONE HCL 10 MG PO TABS: 10.0000 mg | ORAL_TABLET | Freq: Three times a day (TID) | ORAL | 2 refills | Status: DC

## 2019-09-20 MED ORDER — SERTRALINE HCL 50 MG PO TABS: 75.0000 mg | ORAL_TABLET | Freq: Every day | ORAL | 2 refills | Status: DC

## 2019-09-20 MED ORDER — PROPRANOLOL HCL 20 MG PO TABS: 20.0000 mg | ORAL_TABLET | Freq: Two times a day (BID) | ORAL | 2 refills | Status: DC

## 2019-09-20 MED ORDER — TRAZODONE HCL 50 MG PO TABS: 50.0000 mg | ORAL_TABLET | Freq: Every evening | ORAL | 2 refills | Status: DC | PRN

## 2019-09-20 MED ORDER — QUETIAPINE FUMARATE 100 MG PO TABS: 100.0000 mg | ORAL_TABLET | Freq: Every day | ORAL | 2 refills | Status: DC

## 2019-09-20 MED ORDER — HYDROXYZINE HCL 25 MG PO TABS: 25.0000 mg | ORAL_TABLET | Freq: Three times a day (TID) | ORAL | 2 refills | Status: DC | PRN

## 2019-09-20 MED FILL — QUETIAPINE FUMARATE 100 MG: 100 | 30 days supply | Qty: 30 | Fill #0
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
## 2019-09-21 ENCOUNTER — Encounter (HOSPITAL_COMMUNITY): Payer: Self-pay | Admitting: Internal Medicine

## 2019-09-21 DIAGNOSIS — F419 Anxiety disorder, unspecified: Secondary | ICD-10-CM

## 2019-09-21 DIAGNOSIS — F329 Major depressive disorder, single episode, unspecified: Secondary | ICD-10-CM

## 2019-09-21 NOTE — BH Assessment (Signed)
Q-Actual   Writer attempted to contact the participant in order to complete a PHQ 2-9.  Writer was unsuccessful in reaching the patient.  

## 2019-09-30 ENCOUNTER — Encounter: Payer: Self-pay | Admitting: Internal Medicine

## 2019-09-30 DIAGNOSIS — S025XXA Fracture of tooth (traumatic), initial encounter for closed fracture: Secondary | ICD-10-CM

## 2019-09-30 DIAGNOSIS — Z9189 Other specified personal risk factors, not elsewhere classified: Secondary | ICD-10-CM

## 2019-10-05 ENCOUNTER — Ambulatory Visit
Admission: RE | Admit: 2019-10-05 | Discharge: 2019-10-05 | Disposition: A | Discharge status: Home / Self Care | Payer: Self-pay | Source: Ambulatory Visit | Attending: Obstetrics & Gynecology | Admitting: Obstetrics & Gynecology

## 2019-10-05 ENCOUNTER — Other Ambulatory Visit: Payer: Self-pay

## 2019-10-05 DIAGNOSIS — G8929 Other chronic pain: Secondary | ICD-10-CM | POA: Insufficient documentation

## 2019-10-05 DIAGNOSIS — N939 Abnormal uterine and vaginal bleeding, unspecified: Secondary | ICD-10-CM | POA: Insufficient documentation

## 2019-10-05 DIAGNOSIS — R102 Pelvic and perineal pain: Secondary | ICD-10-CM | POA: Insufficient documentation

## 2019-11-01 MED FILL — QUETIAPINE FUMARATE 100 MG: 100 | 30 days supply | Qty: 30 | Fill #0

## 2019-11-05 ENCOUNTER — Ambulatory Visit (INDEPENDENT_AMBULATORY_CARE_PROVIDER_SITE_OTHER): Payer: Self-pay | Admitting: Obstetrics & Gynecology

## 2019-11-05 ENCOUNTER — Encounter: Payer: Self-pay | Admitting: Obstetrics & Gynecology

## 2019-11-05 ENCOUNTER — Other Ambulatory Visit: Payer: Self-pay

## 2019-11-05 VITALS — BP 150/109 | HR 78 | Ht 66.0 in | Wt 327.0 lb

## 2019-11-05 DIAGNOSIS — Z23 Encounter for immunization: Secondary | ICD-10-CM

## 2019-11-05 DIAGNOSIS — N840 Polyp of corpus uteri: Secondary | ICD-10-CM

## 2019-11-05 DIAGNOSIS — N939 Abnormal uterine and vaginal bleeding, unspecified: Secondary | ICD-10-CM

## 2019-11-05 MED ORDER — NAPROXEN 500 MG PO TABS: 500.0000 mg | ORAL_TABLET | Freq: Two times a day (BID) | ORAL | 2 refills | Status: DC

## 2019-11-05 MED ORDER — MEGESTROL ACETATE 40 MG PO TABS: 40.0000 mg | ORAL_TABLET | Freq: Two times a day (BID) | ORAL | 5 refills | Status: DC

## 2019-11-05 MED FILL — MEGESTROL 40 MG TABLET: 40 | 15 days supply | Qty: 60 | Fill #0

## 2019-11-05 MED FILL — ?NAPROXEN 500 MG TABS: 500 | 30 days supply | Qty: 60 | Fill #0

## 2019-11-05 NOTE — Progress Notes (Signed)
Patient has elevated phq 9 & gad7- has someone she sees regularly

## 2019-11-05 NOTE — Progress Notes (Signed)
GYNECOLOGY OFFICE VISIT NOTE  History:   Kristen [name] is a 34 y.o. G0P0 with history of PCOS, AUB, morbid obesity here today for follow up after recent ultrasound done for heavy abnormal bleeding. Accompanied by her mother. Still reports erratic bleeding that can get heavy some times. No symptoms of anemia currently.  She denies any current abnormal vaginal discharge, bleeding, pelvic pain or other concerns.    Past Medical History:  Diagnosis Date  . Anxiety 2013   treated at [facility] by Deatra Robinson   . Asthma 1989    no hospitalization, or intubation, previously on advair and singulair. asthma worsening.   . 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 Depression 2001   depressed since childhood   . 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 Essential hypertension 03/12/2019  . Kyphosis of thoracic region 2004    painful, runs in dad side   . Migraine   . Pancreatitis 2003   gallstone pancreatitis   . Pancreatitis due to biliary obstruction 2003  . PCOS (polycystic ovarian syndrome) 2014   facial hair, irregular periods, never been pregnant     Past Surgical History:  Procedure Laterality Date  . CHOLECYSTECTOMY  2003   . OPEN REDUCTION INTERNAL FIXATION (ORIF) PROXIMAL PHALANX Right 07/23/2013   Procedure: OPEN TREATMENT RIGHT RING FINGER, PROXIMAL INTERPHALANGEAL/JOINT FRACTURE DISLOCATION;  Surgeon: Jodi Marble, MD;  Location: [facility];  Service: Orthopedics;  Laterality: Right;  . TONSILLECTOMY      The following portions of the patient's history were reviewed and updated as appropriate: allergies, current medications, past family history, past medical history, past social history, past surgical history and problem list.   Health Maintenance:  Normal pap and negative HRHPV on 03/10/2019.  Review of Systems:  Pertinent items noted in HPI and remainder of comprehensive ROS otherwise negative.  Physical Exam:  BP (!) 150/109   Pulse 78   Ht  5\' 6"  (1.676 m)   Wt (!) 327 lb (148.3 kg)   LMP 10/21/2019 (Approximate)   BMI 52.78 kg/m  CONSTITUTIONAL: Well-developed, well-nourished female in no acute distress.  HEENT:  Normocephalic, atraumatic. External right and left ear normal. No scleral icterus.  NECK: Normal range of motion, supple, no masses noted on observation SKIN: No rash noted. Not diaphoretic. No erythema. No pallor. MUSCULOSKELETAL: Normal range of motion. No edema noted. NEUROLOGIC: Alert and oriented to person, place, and time. Normal muscle tone coordination. No cranial nerve deficit noted. PSYCHIATRIC: Normal mood and affect. Normal behavior. Normal judgment and thought content. CARDIOVASCULAR: Normal heart rate noted RESPIRATORY: Effort and breath sounds normal, no problems with respiration noted ABDOMEN: Obese. No masses noted. No other overt distention noted.   PELVIC: Deferred  Imaging 10/23/2019 PELVIC COMPLETE WITH TRANSVAGINAL  Result Date: 10/05/2019 CLINICAL DATA:  Painful heavy menses, history of polycystic ovarian syndrome, irregular menses, LMP 09/18/2019 EXAM: TRANSABDOMINAL AND TRANSVAGINAL ULTRASOUND OF PELVIS TECHNIQUE: Both transabdominal and transvaginal ultrasound examinations of the pelvis were performed. Transabdominal technique was performed for global imaging of the pelvis including uterus, ovaries, adnexal regions, and pelvic cul-de-sac. It was necessary to proceed with endovaginal exam following the transabdominal exam to visualize the uterus, endometrium, and ovaries. COMPARISON:  05/27/2016 FINDINGS: Uterus Measurements: 7.2 x 3.6 x 5.7 cm = volume: 77 mL. Anteverted. Mildly heterogeneous myometrium. No focal mass. Nabothian cysts at cervix. Endometrium Thickness: 11 mm. Probable hyperechoic mass within endometrial canal measuring 11 x  6 x 9 mm likely an endometrial polyp. No endometrial fluid. Right ovary Measurements: 2.8 x 2.0 x 3.0 cm = volume: 8.6 mL. Normal morphology without mass Left ovary  Measurements: 2.5 x 1.8 x 2.4 cm = volume: 5.8 mL. Normal morphology without mass Other findings No free pelvic fluid.  No adnexal masses. IMPRESSION: Probable endometrial polyp 11 x 6 x 9 mm; consider sonohysterogram for further evaluation, prior to hysteroscopy or endometrial biopsy. Remainder of exam unremarkable. Electronically Signed   By: Ulyses Southward M.D.   On: 10/05/2019 17:21       Assessment and Plan:      1. Flu vaccine need - Flu Vaccine QUAD 36+ mos IM given   2. Abnormal uterine bleeding (AUB) 3. Endometrial polyp Ultrasound findings discussed with patient. Discussed that in addition to PCOS, elevated BMI; the endometrial polyp can also contribute to heavy bleeding. Recommended hysteroscopy, dilation and curettage, polypectomy and progestin intrauterine device placement.  Explained concern about morbid obesity and increased risk for endometrial pathology, which is why the progestin IUD is recommended.  All questions were answered. Discussed risks of surgery with patient, printed information given to patient about the procedure.  She was told that she will be contacted by our surgical scheduler regarding the time and date of her surgery; routine preoperative instructions will be given to her by the preoperative nursing team.   She is aware of need for preoperative COVID testing and subsequent quarantine from time of test to time of surgery; she will be given further preoperative instructions at that COVID screening visit. In the meantime, Megace and Naproxen was prescribed to help control the bleeding. - megestrol (MEGACE) 40 MG tablet; Take 1 tablet (40 mg total) by mouth 2 (two) times daily. Can increase to two tablets twice a day in the event of heavy bleeding  Dispense: 60 tablet; Refill: 5 - naproxen (NAPROSYN) 500 MG tablet; Take 1 tablet (500 mg total) by mouth 2 (two) times daily with a meal. As needed for pain and menstrual bleeding  Dispense: 60 tablet; Refill: 2 Routine  preventative health maintenance measures emphasized. Please refer to After Visit Summary for other counseling recommendations.   Return for any gynecologic concerns.    Total face-to-face time with patient: 30 minutes.  Over 50% of encounter was spent on counseling and coordination of care.   Jaynie Collins, MD, FACOG Obstetrician & Gynecologist, [facility] for [employer], [facility]

## 2019-11-05 NOTE — Patient Instructions (Addendum)
[facility] [employer]  [facility] [employer] [address], [place], [place] [address] [phone]   or  www.https://hall.info/[place].gov[place]/human-services/social-services **SNAP/EBT/ Other nutritional benefits  [facility] [place] DHHS-Public Health-WIC [address], [place], [place] [address] [phone]  or  https://king.net/https://[place].gov/[place]/human-services/health-department **WIC for  women who are pregnant and postpartum, infants and children up to 32 years old  [facility] [address], [place], [place] [address] [phone]   or   www.theblessedtable.org  **Food pantry  [facility] [address], [place], [place] [address] [phone]   or   https://brotherkolbes.godaddysites.com  **Emergency food and prepared meals  [address] [employer] [facility] [address], [place], [place] [address] [phone]   or   www.cedargrovetop.us **Food pantry  [facility] [address], [place], [place] [address] [phone]   or   www.GolfingFamily.nofacebook.com/[facility]-116430931718202 **Food pantry  [facility] [address], [place], [place] [address] [phone] **Food pantry  [facility] [address], [place], [place] [address] [phone]   or   www.greensborourbanministry.org  Tour manager**Food pantry and prepared meals  [facility] [address], [place], [place] [address] VerifiedMovies.[email or web address]  **Food pantry  [facility] [employer] [facility] [address], [place], [place] [address] [phone]   or   www.lbcnow.org  **Food pantry  [facility] [address], [place], [place] [address] [phone]   or   WorkingMBA.co.nzhttp://www.onestepfurther.com **Food pantry, nutrition education, gardening activities  [facility] [address], [place], [place] [address] [phone] **Food pantry  [facility] [place] [address], [place], [place] [address] [phone]   or   www.[facility]vationarmy[facility].Roddie Mcorg **Food pantry  [facility] [place] [address], [place], [place] [address] [phone]   or   http://senior-resources-[place].org [employer]**Meals on Wheels Program  [facility] [address], [place], [place] [address] [phone]   or   www.stmattchurch.com  **Food pantry  [facility] [address], [place], [place] [address] [phone]   or   vandaliapresbyterianchurch.org **Food pantry    Hysteroscopy Hysteroscopy is a procedure that is used to examine the inside of a woman's womb (uterus). This may be done for various reasons, including:  To look for lumps (tumors) and other growths in the uterus.  To evaluate abnormal bleeding, fibroid tumors, polyps, scar tissue (adhesions), or cancer of the uterus.  To determine the cause of an inability to get pregnant (infertility) or repeated losses of pregnancies (miscarriages).  To find a lost IUD (intrauterine device).  To perform a procedure that permanently prevents pregnancy (sterilization). During this procedure, a thin, flexible tube with a small light and camera (hysteroscope) is used to examine the uterus. The camera sends images to a monitor in the room so that your health care provider can view the inside of your uterus. A hysteroscopy should be done right after a menstrual period to make sure that you are not pregnant. Tell a health care provider about:  Any allergies you have.  All medicines you are taking, including vitamins, herbs, eye drops, creams, and over-the-counter medicines.  Any problems you or family members have had with the use of anesthetic medicines.  Any blood disorders you have.  Any surgeries you have had.  Any medical conditions  you have.  Whether you are pregnant or may be pregnant. What are the risks? Generally, this is a safe procedure. However,  problems may occur, including:  Excessive bleeding.  Infection.  Damage to the uterus or other structures or organs.  Allergic reaction to medicines or fluids that are used in the procedure. What happens before the procedure? Staying hydrated Follow instructions from your health care provider about hydration, which may include:  Up to 2 hours before the procedure - you may continue to drink clear liquids, such as water, clear fruit juice, black coffee, and plain tea. Eating and drinking restrictions Follow instructions from your health care provider about eating and drinking, which may include:  8 hours before the procedure - stop eating solid foods and drink clear liquids only  2 hours before the procedure - stop drinking clear liquids. General instructions  Ask your health care provider about: ? Changing or stopping your normal medicines. This is important if you take diabetes medicines or blood thinners. ? Taking medicines such as aspirin and ibuprofen. These medicines can thin your blood and cause bleeding. Do not take these medicines for 1 week before your procedure, or as told by your health care provider.  Do not use any products that contain nicotine or tobacco for 2 weeks before the procedure. This includes cigarettes and e-cigarettes. If you need help quitting, ask your health care provider.  Medicine may be placed in your cervix the day before the procedure. This medicine causes the cervix to have a larger opening (dilate). The larger opening makes it easier for the hysteroscope to be inserted into the uterus during the procedure.  Plan to have someone with you for the first 24-48 hours after the procedure, especially if you are given a medicine to make you fall asleep (general anesthetic).  Plan to have someone take you home from the hospital or  clinic. What happens during the procedure?  To lower your risk of infection: ? Your health care team will wash or sanitize their hands. ? Your skin will be washed with soap. ? Hair may be removed from the surgical area.  An IV tube will be inserted into one of your veins.  You may be given one or more of the following: ? A medicine to help you relax (sedative). ? A medicine that numbs the area around the cervix (local anesthetic). ? A medicine to make you fall asleep (general anesthetic).  A hysteroscope will be inserted through your vagina and into your uterus.  Air or fluid will be used to enlarge your uterus, enabling your health care provider to see your uterus better. The amount of fluid used will be carefully checked throughout the procedure.  In some cases, tissue may be gently scraped from inside the uterus and sent to a lab for testing (biopsy). The procedure may vary among health care providers and hospitals. What happens after the procedure?  Your blood pressure, heart rate, breathing rate, and blood oxygen level will be monitored until the medicines you were given have worn off.  You may have some cramping. You may be given medicines for this.  You may have bleeding, which varies from light spotting to menstrual-like bleeding. This is normal.  If you had a biopsy done, it is your responsibility to get the results of your procedure. Ask your health care provider, or the department performing the procedure, when your results will be ready. Summary  Hysteroscopy is a procedure that is used to examine the inside of a woman's womb (uterus).  After the procedure, you may have bleeding, which varies from light spotting to menstrual-like bleeding.  This is normal. You may also have cramping.  Plan to have someone take you home from the hospital or clinic. This information is not intended to replace advice given to you by your health care provider. Make sure you discuss any  questions you have with your health care provider. Document Revised: 01/03/2017 Document Reviewed: 02/20/2016 Elsevier Patient Education  2020 Elsevier Inc.     Intrauterine Device Insertion An intrauterine device (IUD) is a medical device that gets inserted into the uterus to prevent pregnancy. It is a small, T-shaped device that has one or two nylon strings hanging down from it. The strings hang out of the lower part of the uterus (cervix) to allow for future IUD removal. There are two types of IUDs available:  Copper IUD. This type of IUD has copper wire wrapped around it. Copper makes the uterus and fallopian tubes produce a fluid that kills sperm. A copper IUD may last up to 10 years.  Hormone IUD. This type of IUD is made of plastic and contains the hormone progestin (synthetic progesterone). The hormone thickens mucus in the cervix and prevents sperm from entering the uterus. It also thins the uterine lining to prevent implantation of a fertilized egg. The hormone can weaken or kill the sperm that get into the uterus. A hormone IUD may last 3-5 years. Tell a health care provider about:  Any allergies you have.  All medicines you are taking, including vitamins, herbs, eye drops, creams, and over-the-counter medicines.  Any problems you or family members have had with anesthetic medicines.  Any blood disorders you have.  Any surgeries you have had.  Any medical conditions you have, including any STIs (sexually transmitted infections) you may have.  Whether you are pregnant or may be pregnant. What are the risks? Generally, this is a safe procedure. However, problems may occur, including:  Infection.  Bleeding.  Allergic reactions to medicines.  Accidental puncture (perforation) of the uterus, or damage to other structures or organs.  Accidental placement of the IUD either in the muscle layer of the uterus (myometrium) or outside the uterus.  The IUD falling out of the  uterus (expulsion). This is more common among women who have recently had a child.  Pregnancy that happens in the fallopian tube (ectopic pregnancy).  Infection of the uterus and fallopian tubes (pelvic inflammatory disease). What happens before the procedure?  Schedule the IUD insertion for when you will have your menstrual period or right after, to make sure you are not pregnant. Placement of the IUD is better tolerated shortly after a menstrual cycle.  Follow instructions from your health care provider about eating or drinking restrictions.  Ask your health care provider about changing or stopping your regular medicines. This is especially important if you are taking diabetes medicines or blood thinners.  You may get a pain reliever to take before the procedure.  You may have tests for: ? Pregnancy. A pregnancy test involves having a urine sample taken. ? STIs. Placing an IUD in someone who has an STI can make the infection worse. ? Cervical cancer. You may have a Pap test to check for this type of cancer. This means collecting cells from your cervix to be examined under a microscope.  You may have a physical exam to determine the size and position of your uterus. The procedure may vary among health care providers and hospitals. What happens during the procedure?  A tool (speculum) will be placed in your vagina and widened  so that your health care provider can see your cervix.  Medicine may be applied to your cervix to help lower your risk of infection (antiseptic medicine).  You may be given an anesthetic medicine to numb each side of your cervix (intracervical block or paracervical block). This medicine is usually given by an injection into the cervix.  A tool (uterine sound) will be inserted into your uterus to determine the length of your uterus and the direction that your uterus may be tilted.  A slim instrument or tube (IUD inserter) that holds the IUD will be inserted into  your vagina, through your cervical canal, and into your uterus.  The IUD will be placed in the uterus, and the IUD inserter will be removed.  The strings that are attached to the IUD will be trimmed so that they lie just below the cervix. The procedure may vary among health care providers and hospitals. What happens after the procedure?  You may have bleeding after the procedure. This is normal. It varies from light bleeding (spotting) for a few days to menstrual-like bleeding.  You may have cramping and pain.  You may feel dizzy or light-headed.  You may have lower back pain. Summary  An intrauterine device (IUD) is a small, T-shaped device that has one or two nylon strings hanging down from it.  Two types of IUDs are available. You may have a copper IUD or a hormone IUD.  Schedule the IUD insertion for when you will have your menstrual period or right after, to make sure you are not pregnant. Placement of the IUD is better tolerated shortly after a menstrual cycle.  You may have bleeding after the procedure. This is normal. It varies from light spotting for a few days to menstrual-like bleeding. This information is not intended to replace advice given to you by your health care provider. Make sure you discuss any questions you have with your health care provider. Document Revised: 01/03/2017 Document Reviewed: 12/13/2015 Elsevier Patient Education  [employer].   Hysteroscopy Hysteroscopy is a procedure that is used to examine the inside of a woman's womb (uterus). This may be done for various reasons, including:  To look for lumps (tumors) and other growths in the uterus.  To evaluate abnormal bleeding, fibroid tumors, polyps, scar tissue (adhesions), or cancer of the uterus.  To determine the cause of an inability to get pregnant (infertility) or repeated losses of pregnancies (miscarriages).  To find a lost IUD (intrauterine device).  To perform a procedure that  permanently prevents pregnancy (sterilization). During this procedure, a thin, flexible tube with a small light and camera (hysteroscope) is used to examine the uterus. The camera sends images to a monitor in the room so that your health care provider can view the inside of your uterus. A hysteroscopy should be done right after a menstrual period to make sure that you are not pregnant. Tell a health care provider about:  Any allergies you have.  All medicines you are taking, including vitamins, herbs, eye drops, creams, and over-the-counter medicines.  Any problems you or family members have had with the use of anesthetic medicines.  Any blood disorders you have.  Any surgeries you have had.  Any medical conditions you have.  Whether you are pregnant or may be pregnant. What are the risks? Generally, this is a safe procedure. However, problems may occur, including:  Excessive bleeding.  Infection.  Damage to the uterus or other structures or organs.  Allergic  reaction to medicines or fluids that are used in the procedure. What happens before the procedure? Staying hydrated Follow instructions from your health care provider about hydration, which may include:  Up to 2 hours before the procedure - you may continue to drink clear liquids, such as water, clear fruit juice, black coffee, and plain tea. Eating and drinking restrictions Follow instructions from your health care provider about eating and drinking, which may include:  8 hours before the procedure - stop eating solid foods and drink clear liquids only  2 hours before the procedure - stop drinking clear liquids. General instructions  Ask your health care provider about: ? Changing or stopping your normal medicines. This is important if you take diabetes medicines or blood thinners. ? Taking medicines such as aspirin and ibuprofen. These medicines can thin your blood and cause bleeding. Do not take these medicines for 1  week before your procedure, or as told by your health care provider.  Do not use any products that contain nicotine or tobacco for 2 weeks before the procedure. This includes cigarettes and e-cigarettes. If you need help quitting, ask your health care provider.  Medicine may be placed in your cervix the day before the procedure. This medicine causes the cervix to have a larger opening (dilate). The larger opening makes it easier for the hysteroscope to be inserted into the uterus during the procedure.  Plan to have someone with you for the first 24-48 hours after the procedure, especially if you are given a medicine to make you fall asleep (general anesthetic).  Plan to have someone take you home from the hospital or clinic. What happens during the procedure?  To lower your risk of infection: ? Your health care team will wash or sanitize their hands. ? Your skin will be washed with soap. ? Hair may be removed from the surgical area.  An IV tube will be inserted into one of your veins.  You may be given one or more of the following: ? A medicine to help you relax (sedative). ? A medicine that numbs the area around the cervix (local anesthetic). ? A medicine to make you fall asleep (general anesthetic).  A hysteroscope will be inserted through your vagina and into your uterus.  Air or fluid will be used to enlarge your uterus, enabling your health care provider to see your uterus better. The amount of fluid used will be carefully checked throughout the procedure.  In some cases, tissue may be gently scraped from inside the uterus and sent to a lab for testing (biopsy). The procedure may vary among health care providers and hospitals. What happens after the procedure?  Your blood pressure, heart rate, breathing rate, and blood oxygen level will be monitored until the medicines you were given have worn off.  You may have some cramping. You may be given medicines for this.  You may have  bleeding, which varies from light spotting to menstrual-like bleeding. This is normal.  If you had a biopsy done, it is your responsibility to get the results of your procedure. Ask your health care provider, or the department performing the procedure, when your results will be ready. Summary  Hysteroscopy is a procedure that is used to examine the inside of a woman's womb (uterus).  After the procedure, you may have bleeding, which varies from light spotting to menstrual-like bleeding. This is normal. You may also have cramping.  Plan to have someone take you home from the hospital or  clinic. This information is not intended to replace advice given to you by your health care provider. Make sure you discuss any questions you have with your health care provider. Document Revised: 01/03/2017 Document Reviewed: 02/20/2016 Elsevier Patient Education  2020 [employer].

## 2019-11-08 DIAGNOSIS — N939 Abnormal uterine and vaginal bleeding, unspecified: Secondary | ICD-10-CM

## 2019-11-08 DIAGNOSIS — N938 Other specified abnormal uterine and vaginal bleeding: Secondary | ICD-10-CM | POA: Diagnosis present

## 2019-11-12 ENCOUNTER — Telehealth: Payer: Self-pay | Admitting: General Practice

## 2019-11-12 NOTE — Telephone Encounter (Signed)
Called patient regarding coming in for free IUD application for upcoming surgery. Patient states she has decided she doesn't want the IUD but does want the polyp removed. Told patient I would let Dr Macon Large know. Patient verbalized understanding.

## 2019-11-18 ENCOUNTER — Telehealth (INDEPENDENT_AMBULATORY_CARE_PROVIDER_SITE_OTHER): Payer: No Payment, Other | Admitting: Psychiatry

## 2019-11-18 ENCOUNTER — Other Ambulatory Visit: Payer: Self-pay

## 2019-11-18 ENCOUNTER — Encounter (HOSPITAL_COMMUNITY): Payer: Self-pay | Admitting: Psychiatry

## 2019-11-18 ENCOUNTER — Other Ambulatory Visit (HOSPITAL_COMMUNITY): Payer: Self-pay | Admitting: Psychiatry

## 2019-11-18 DIAGNOSIS — F331 Major depressive disorder, recurrent, moderate: Secondary | ICD-10-CM | POA: Diagnosis not present

## 2019-11-18 DIAGNOSIS — Z1341 Encounter for autism screening: Secondary | ICD-10-CM

## 2019-11-18 DIAGNOSIS — F4010 Social phobia, unspecified: Secondary | ICD-10-CM | POA: Diagnosis not present

## 2019-11-18 DIAGNOSIS — F32A Depression, unspecified: Secondary | ICD-10-CM

## 2019-11-18 DIAGNOSIS — F419 Anxiety disorder, unspecified: Secondary | ICD-10-CM

## 2019-11-18 MED ORDER — BUSPIRONE HCL 15 MG PO TABS: 15.0000 mg | ORAL_TABLET | Freq: Three times a day (TID) | ORAL | 2 refills | Status: DC

## 2019-11-18 MED ORDER — SERTRALINE HCL 100 MG PO TABS: 100.0000 mg | ORAL_TABLET | Freq: Every day | ORAL | 2 refills | Status: DC

## 2019-11-18 MED ORDER — QUETIAPINE FUMARATE 100 MG PO TABS: 100.0000 mg | ORAL_TABLET | Freq: Every day | ORAL | 2 refills | Status: DC

## 2019-11-18 MED ORDER — HYDROXYZINE HCL 25 MG PO TABS: 25.0000 mg | ORAL_TABLET | Freq: Three times a day (TID) | ORAL | 2 refills | Status: DC | PRN

## 2019-11-18 MED ORDER — TRAZODONE HCL 50 MG PO TABS: 50.0000 mg | ORAL_TABLET | Freq: Every evening | ORAL | 2 refills | Status: DC | PRN

## 2019-11-18 NOTE — Progress Notes (Signed)
[place] MD/PA/NP OP Progress Note Virtual Visit via Telephone Note  I connected with Kristen [name] on 11/18/19 at  5:00 PM EDT by telephone and verified that I am speaking with the correct person using two identifiers.  Location: Patient: home Provider: Clinic   I discussed the limitations, risks, security and privacy concerns of performing an evaluation and management service by telephone and the availability of in person appointments. I also discussed with the patient that there may be a patient responsible charge related to this service. The patient expressed understanding and agreed to proceed.   I provided 30 minutes of non-face-to-face time during this encounter.      11/18/2019 5:14 PM Kristen [name]  MRN:  [id number]  Chief Complaint:  I still feel hopeless like i'm never enough"  HPI:  34 year old female seen today for follow up psychiatric evaluation.  She has a psychiatric history of social phobia, ADD, anxiety, depression, and PTSD.  She is currently being managed on Zoloft 75 mg daily, trazodone 50 mg at bedtime, BuSpar 10 mg 2 times daily, hydroxyzine 25 mg 3 times a day as needed, and propanolol 20 mg 2 times daily (recives from PCP).  She notes that her medications are somewhat effective in managing her psychiatric conditions however notes that she has been experiencing increased anxiety and depression lately.  Today she endorses depressive symptoms such as depressed mood, anhedonia, insomnia, fatigue, feelings of guilt, difficulty concentrating, hopelessness, and anxiety.  She notes that she feels hopeless and that she is never enough.  She informed provider that she is currently waiting on approval of SSI and notes that she feel she is a burden to others becausae she has limited  funds and is requiring assistance from family.  She quantifies her depression anxiety as 7/10 (severe anxiety and depression). She denies SI/HI/VAH paranoia.  She denies delusions, irritability,  grandiosity, laible mood, or impulsive behaviors.  She informed Probation officer that her anxiety and depression are exacerbated by her current life stressors.  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    She notes that her mother and father cannot drive and are in need of constant care.  She notes that her mother recently pulled a muscle in her arm and has had an injection in her eye.  She also notes that her father may have dementia which is becoming overwhelming for her.  She notes that she is unable to hang out with friends and reports that her responsibilities pushes people away.    Patient notes that she feels that she has autism.  She notes that she was in special needs classes when she was younger and notes that she has deficits in social communication, deficits in nonverbal communication/social interaction, and inability to understand social norms (noting that at times she talks about things that others are not interested and does not know when to stop talking). She also notes that she has stereotypic movements such as rocking/grasping her hands when nervous, and adherence to routines.  She informed provider that she has to have food separated and often becomes fixated on certain colors.  She is agreeable to increasing BuSpar 10 mg times daily to 15 mg 3 times daily to help manage anxiety.  She is also agreeable to increasing Zoloft 11m to 100 mg to help manage symptoms of anxiety and depression.  [facility].  She will continue all other medications as  prescribed.  No other concerns noted at this time.  Visit Diagnosis:    ICD-10-CM   1. Moderate episode of recurrent major depressive disorder (HCC)  F33.1 QUEtiapine (SEROQUEL) 100 MG tablet    sertraline (ZOLOFT) 100 MG tablet    traZODone (DESYREL) 50 MG tablet    Ambulatory referral to Social Work  2. Anxiety and depression  F41.9 busPIRone (BUSPAR) 15 MG tablet   F32.A  hydrOXYzine (ATARAX/VISTARIL) 25 MG tablet    Ambulatory referral to Social Work  3. Social phobia  F40.10 busPIRone (BUSPAR) 15 MG tablet    sertraline (ZOLOFT) 100 MG tablet  4. Encounter for autism screening  Z13.41 Ambulatory referral to Psychology    Past Psychiatric History: social phobia, ADD, anxiety, depression, and PTSD.  Past Medical History:  Past Medical History:  Diagnosis Date  . Anxiety 2013   treated at [facility] by Pauline Good   . Asthma 1989    no hospitalization, or intubation, previously on advair and singulair. asthma worsening.   . 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 Depression 2001   depressed since childhood   . 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 Essential hypertension 03/12/2019  . Kyphosis of thoracic region 2004    painful, runs in dad side   . Migraine   . Pancreatitis 2003   gallstone pancreatitis   . Pancreatitis due to biliary obstruction 2003  . PCOS (polycystic ovarian syndrome) 2014   facial hair, irregular periods, never been pregnant     Past Surgical History:  Procedure Laterality Date  . CHOLECYSTECTOMY  2003   . OPEN REDUCTION INTERNAL FIXATION (ORIF) PROXIMAL PHALANX Right 07/23/2013   Procedure: OPEN TREATMENT RIGHT RING FINGER, PROXIMAL INTERPHALANGEAL/JOINT FRACTURE DISLOCATION;  Surgeon: Jolyn Nap, MD;  Location: [facility];  Service: Orthopedics;  Laterality: Right;  . TONSILLECTOMY      Family Psychiatric History: Mother PTSD, Father Bipolar disorder and PTSD  Family History:  Family History  Problem Relation Age of Onset  . Hypertension Mother   . Arnold-Chiari malformation Mother   . Restless legs syndrome Mother   . Osteoporosis Mother   . COPD Mother   . Asthma Mother   . Squamous cell carcinoma Mother        skin   . Eczema Mother   . Arthritis Mother   . Peripheral Artery Disease Mother   . Anxiety disorder Mother   . OCD Mother   . Hypertension Father   . Scoliosis  Father   . Bipolar disorder Father   . Congestive Heart Failure Father   . COPD Father   . Depression Father   . Diabetes Maternal Grandmother   . Hypertension Maternal Grandmother   . Dementia Maternal Grandmother   . OCD Maternal Grandmother   . Cancer Paternal Grandmother        colon  . Bone cancer Maternal Grandfather 82       bone marrow   . Multiple myeloma Maternal Grandfather   . Alcohol abuse Maternal Grandfather   . Drug abuse Maternal Grandfather   . Diabetes Paternal Grandfather   . Alcohol abuse Paternal Grandfather   . Drug abuse Paternal Grandfather   . Dementia Paternal Grandfather   . Alcohol abuse Maternal Aunt   . Depression Maternal Aunt   . Alcohol abuse Paternal Aunt   . Depression Paternal Aunt   . ADD / ADHD Cousin     Social  History:  Social History   Socioeconomic History  . Marital status: Single    Spouse name: Not on file  . Number of children: 0  . Years of education: college   . Highest education level: Not on file  Occupational History  . Occupation: Unemployed   Tobacco Use  . Smoking status: Never Smoker  . Smokeless tobacco: Never Used  Vaping Use  . Vaping Use: Never used  Substance and Sexual Activity  . Alcohol use: No  . Drug use: No  . Sexual activity: Yes    Birth control/protection: None  Other Topics Concern  . Not on file  Social History Narrative   Lives with mom [name])    Right-handed   Caffeine: 3 cups of coffee per day   Social Determinants of Health   Financial Resource Strain:   . Difficulty of Paying Living Expenses: Not on file  Food Insecurity: Food Insecurity Present  . Worried About Charity fundraiser in the Last Year: Sometimes true  . Ran Out of Food in the Last Year: Never true  Transportation Needs: No Transportation Needs  . Lack of Transportation (Medical): No  . Lack of Transportation (Non-Medical): No  Physical Activity:   . Days of Exercise per Week: Not on file  . Minutes of  Exercise per Session: Not on file  Stress:   . Feeling of Stress : Not on file  Social Connections:   . Frequency of Communication with Friends and Family: Not on file  . Frequency of Social Gatherings with Friends and Family: Not on file  . Attends Religious Services: Not on file  . Active Member of Clubs or Organizations: Not on file  . Attends Archivist Meetings: Not on file  . Marital Status: Not on file    Allergies:  Allergies  Allergen Reactions  . Mucinex [Guaifenesin Er] Hives, Itching and Swelling  . Penicillins Swelling    Did it involve swelling of the face/tongue/throat, SOB, or low BP? Yes Did it involve sudden or severe rash/hives, skin peeling, or any reaction on the inside of your mouth or nose? No Did you need to seek medical attention at a hospital or doctor's office? Yes When did it last happen?>10 years ago If all above answers are "NO", may proceed with cephalosporin use.   . Contrast Media [Iodinated Diagnostic Agents] Nausea And Vomiting  . Multihance [Gadobenate] Nausea And Vomiting    Metabolic Disorder Labs: Lab Results  Component Value Date   HGBA1C 4.9 04/15/2019   MPG 93.93 04/15/2019   Lab Results  Component Value Date   PROLACTIN 15.9 09/18/2016   PROLACTIN 32.9 (H) 06/20/2016   Lab Results  Component Value Date   CHOL 204 (H) 04/15/2019   TRIG 146 04/15/2019   HDL 41 04/15/2019   CHOLHDL 5.0 04/15/2019   VLDL 29 04/15/2019   LDLCALC 134 (H) 04/15/2019   Lab Results  Component Value Date   TSH 4.628 (H) 04/15/2019   TSH 2.140 07/22/2017    Therapeutic Level Labs: No results found for: LITHIUM No results found for: VALPROATE No components found for:  CBMZ  Current Medications: Current Outpatient Medications  Medication Sig Dispense Refill  . albuterol (VENTOLIN HFA) 108 (90 Base) MCG/ACT inhaler Inhale 2 puffs into the lungs every 6 (six) hours as needed for wheezing or shortness of breath. 6.7 g 3  .  busPIRone (BUSPAR) 15 MG tablet Take 1 tablet (15 mg total) by mouth 3 (three) times daily.  For anxiety 90 tablet 2  . hydrOXYzine (ATARAX/VISTARIL) 25 MG tablet Take 1 tablet (25 mg total) by mouth 3 (three) times daily as needed for anxiety. 90 tablet 2  . megestrol (MEGACE) 40 MG tablet Take 1 tablet (40 mg total) by mouth 2 (two) times daily. Can increase to two tablets twice a day in the event of heavy bleeding 60 tablet 5  . naproxen (NAPROSYN) 500 MG tablet Take 1 tablet (500 mg total) by mouth 2 (two) times daily with a meal. As needed for pain and menstrual bleeding 60 tablet 2  . propranolol (INDERAL) 20 MG tablet Take 1 tablet (20 mg total) by mouth 2 (two) times daily. For anxiety 60 tablet 2  . QUEtiapine (SEROQUEL) 100 MG tablet Take 1 tablet (100 mg total) by mouth at bedtime. 30 tablet 2  . sertraline (ZOLOFT) 100 MG tablet Take 1 tablet (100 mg total) by mouth daily. For depression 30 tablet 2  . traZODone (DESYREL) 50 MG tablet Take 1 tablet (50 mg total) by mouth at bedtime as needed for sleep. 30 tablet 2   No current facility-administered medications for this visit.     Musculoskeletal: Strength & Muscle Tone: To assess due to telephone visit Gait & Station: To assess due to telephone visit Patient leans: N/A  Psychiatric Specialty Exam: Review of Systems  Last menstrual period 10/21/2019.There is no height or weight on file to calculate BMI.  General Appearance: To assess due to telephone visit  Eye Contact:  To assess due to telephone visit  Speech:  Clear and Coherent and Normal Rate  Volume:  Normal  Mood:  Anxious and Depressed  Affect:  Congruent  Thought Process:  Coherent, Goal Directed and Linear  Orientation:  Full (Time, Place, and Person)  Thought Content: WDL and Logical   Suicidal Thoughts:  No  Homicidal Thoughts:  No  Memory:  Immediate;   Good Recent;   Good Remote;   Good  Judgement:  Good  Insight:  Good  Psychomotor Activity:  Normal   Concentration:  Concentration: Good and Attention Span: Good  Recall:  Good  Fund of Knowledge: Good  Language: Good  Akathisia:  No  Handed:  Right  AIMS (if indicated): Not done  Assets:  Communication Skills Desire for Improvement Financial Resources/Insurance Housing Social Support  ADL's:  Intact  Cognition: WNL  Sleep:  Good   Screenings: AIMS     Admission (Discharged) from OP Visit from 04/14/2019 in [place] [facility]  AIMS Total Score 0    AUDIT     Admission (Discharged) from OP Visit from 04/14/2019 in [place] [facility]  Alcohol Use Disorder Identification Test Final Score (AUDIT) 0    GAD-7     Office Visit from 11/05/2019 in [place] for [place] [facility] Telemedicine from 06/25/2019 in [place] Office Visit from 04/23/2019 in [place] Office Visit from 03/12/2019 in [place] Office Visit from 03/10/2019 in [place] for [facility]  Total GAD-7 Score 19 21 21 18 15     PHQ2-9     Video Visit from 11/18/2019 in [facility] Office Visit from 11/05/2019 in [place] for [place] [facility] Telemedicine from 06/25/2019 in [place] Counselor from 05/10/2019 in [place] Office Visit from 04/23/2019 in  [place]  PHQ-2 Total Score 5 6 6 6 6   PHQ-9 Total Score 21 22 22 22 19        Assessment and Plan: Patient notes that she has feelings of hopelessness and anxiety.  She is agreeable to increasing Zoloft to 75 mg to 100 mg to help improve depression anxiety.  She is also agreeable to increasing BuSpar 10 mg 3 times daily to 50 mg 3 times daily.  She would like to be seen by a specialist for suspected autism.  She was  referred to have [facility] for further evaluation evaluation.  1. Anxiety and depression  Increased- busPIRone (BUSPAR) 15 MG tablet; Take 1 tablet (15 mg total) by mouth 3 (three) times daily. For anxiety  Dispense: 90 tablet; Refill: 2 Continue- hydrOXYzine (ATARAX/VISTARIL) 25 MG tablet; Take 1 tablet (25 mg total) by mouth 3 (three) times daily as needed for anxiety.  Dispense: 90 tablet; Refill: 2 - Ambulatory referral to Social Work 2. Social phobia  Increased- busPIRone (BUSPAR) 15 MG tablet; Take 1 tablet (15 mg total) by mouth 3 (three) times daily. For anxiety  Dispense: 90 tablet; Refill: 2 Increased- sertraline (ZOLOFT) 100 MG tablet; Take 1 tablet (100 mg total) by mouth daily. For depression  Dispense: 30 tablet; Refill: 2  3. Encounter for autism screening  - Ambulatory referral to Psychology  4. Moderate episode of recurrent major depressive disorder (HCC)  Continue- QUEtiapine (SEROQUEL) 100 MG tablet; Take 1 tablet (100 mg total) by mouth at bedtime.  Dispense: 30 tablet; Refill: 2 Increased- sertraline (ZOLOFT) 100 MG tablet; Take 1 tablet (100 mg total) by mouth daily. For depression  Dispense: 30 tablet; Refill: 2 - traZODone (DESYREL) 50 MG tablet; Take 1 tablet (50 mg total) by mouth at bedtime as needed for sleep.  Dispense: 30 tablet; Refill: 2 - Ambulatory referral to Social Work  Follow-up in 3 months Follow-up therapy Salley Slaughter, NP 11/18/2019, 5:14 PM

## 2019-11-19 MED FILL — busPIRone HCL 15 MG TABS: 15 | 30 days supply | Qty: 90 | Fill #0

## 2019-11-19 MED FILL — SERTRALINE HCL 100 MG TAB: 100 | 30 days supply | Qty: 30 | Fill #0

## 2019-11-19 MED FILL — ?TRAZODONE HCL 50 TABS: 50 | 30 days supply | Qty: 30 | Fill #0

## 2019-11-19 MED FILL — hydrOXYzine HCL 25 MG TABS: 25 | 30 days supply | Qty: 90 | Fill #0

## 2019-12-09 MED FILL — hydrOXYzine HCL 25 MG TABS: 25 | 30 days supply | Qty: 90 | Fill #0

## 2019-12-09 MED FILL — ?TRAZODONE HCL 50 TABS: 50 | 30 days supply | Qty: 30 | Fill #0

## 2019-12-09 MED FILL — SERTRALINE HCL 100 MG TAB: 100 | 30 days supply | Qty: 30 | Fill #0

## 2019-12-09 MED FILL — busPIRone HCL 15 MG TABS: 15 | 30 days supply | Qty: 90 | Fill #0

## 2019-12-11 ENCOUNTER — Other Ambulatory Visit (HOSPITAL_COMMUNITY)
Admission: RE | Admit: 2019-12-11 | Discharge: 2019-12-11 | Disposition: A | Discharge status: Home / Self Care | Payer: HRSA Program | Source: Ambulatory Visit | Attending: Obstetrics & Gynecology | Admitting: Obstetrics & Gynecology

## 2019-12-11 DIAGNOSIS — Z20822 Contact with and (suspected) exposure to covid-19: Secondary | ICD-10-CM | POA: Diagnosis not present

## 2019-12-11 DIAGNOSIS — Z01812 Encounter for preprocedural laboratory examination: Secondary | ICD-10-CM | POA: Diagnosis present

## 2019-12-11 LAB — SARS CORONAVIRUS 2 (TAT 6-24 HRS): SARS Coronavirus 2: NEGATIVE

## 2019-12-13 ENCOUNTER — Encounter (HOSPITAL_COMMUNITY): Payer: Self-pay | Admitting: Obstetrics & Gynecology

## 2019-12-13 NOTE — Progress Notes (Signed)
Spoke with pt for pre-op call. Pt has hx of PVC's and HTN. Denies any recent chest pain or shortness of breath. Pt states she is not diabetic.  Covid test done on 12/11/19 and it's negative. Pt states she has been in quarantine since the test was done and understands to stay in quarantine until she comes to the hospital tomorrow.

## 2019-12-14 ENCOUNTER — Other Ambulatory Visit: Payer: Self-pay

## 2019-12-14 ENCOUNTER — Ambulatory Visit (HOSPITAL_COMMUNITY): Payer: Self-pay | Admitting: Anesthesiology

## 2019-12-14 ENCOUNTER — Encounter (HOSPITAL_COMMUNITY)
Admission: RE | Disposition: A | Discharge status: Home / Self Care | Payer: Self-pay | Source: Home / Self Care | Attending: Obstetrics & Gynecology

## 2019-12-14 ENCOUNTER — Other Ambulatory Visit: Payer: Self-pay | Admitting: Obstetrics and Gynecology

## 2019-12-14 ENCOUNTER — Encounter (HOSPITAL_COMMUNITY): Payer: Self-pay | Admitting: Obstetrics & Gynecology

## 2019-12-14 ENCOUNTER — Telehealth: Payer: Self-pay

## 2019-12-14 ENCOUNTER — Ambulatory Visit (HOSPITAL_COMMUNITY)
Admission: RE | Admit: 2019-12-14 | Discharge: 2019-12-14 | Disposition: A | Discharge status: Home / Self Care | Payer: Self-pay | Attending: Obstetrics & Gynecology | Admitting: Obstetrics & Gynecology

## 2019-12-14 DIAGNOSIS — N840 Polyp of corpus uteri: Secondary | ICD-10-CM

## 2019-12-14 DIAGNOSIS — N938 Other specified abnormal uterine and vaginal bleeding: Secondary | ICD-10-CM | POA: Diagnosis present

## 2019-12-14 DIAGNOSIS — Z91040 Latex allergy status: Secondary | ICD-10-CM | POA: Insufficient documentation

## 2019-12-14 DIAGNOSIS — Z79899 Other long term (current) drug therapy: Secondary | ICD-10-CM | POA: Insufficient documentation

## 2019-12-14 DIAGNOSIS — Z888 Allergy status to other drugs, medicaments and biological substances status: Secondary | ICD-10-CM | POA: Insufficient documentation

## 2019-12-14 DIAGNOSIS — Z880 Allergy status to penicillin: Secondary | ICD-10-CM | POA: Insufficient documentation

## 2019-12-14 DIAGNOSIS — Z91041 Radiographic dye allergy status: Secondary | ICD-10-CM | POA: Insufficient documentation

## 2019-12-14 DIAGNOSIS — N939 Abnormal uterine and vaginal bleeding, unspecified: Secondary | ICD-10-CM | POA: Insufficient documentation

## 2019-12-14 HISTORY — PX: DILATATION & CURETTAGE/HYSTEROSCOPY WITH MYOSURE: SHX6511

## 2019-12-14 HISTORY — DX: Personal history of other mental and behavioral disorders: Z86.59

## 2019-12-14 HISTORY — DX: Personal history of other diseases of the digestive system: Z87.19

## 2019-12-14 HISTORY — DX: Irritable bowel syndrome, unspecified: K58.9

## 2019-12-14 HISTORY — DX: Gastro-esophageal reflux disease without esophagitis: K21.9

## 2019-12-14 HISTORY — DX: Sleep apnea, unspecified: G47.30

## 2019-12-14 LAB — BASIC METABOLIC PANEL
Anion gap: 7 (ref 5–15)
BUN: 13 mg/dL (ref 6–20)
CO2: 23 mmol/L (ref 22–32)
Calcium: 9 mg/dL (ref 8.9–10.3)
Chloride: 109 mmol/L (ref 98–111)
Creatinine, Ser: 0.91 mg/dL (ref 0.44–1.00)
GFR, Estimated: >60 mL/min (ref >60)
Glucose, Bld: 92 mg/dL (ref 70–99)
Potassium: 4.2 mmol/L (ref 3.5–5.1)
Sodium: 139 mmol/L (ref 135–145)

## 2019-12-14 LAB — CBC
HCT: 43.9 % (ref 36.0–46.0)
Hemoglobin: 14 g/dL (ref 12.0–15.0)
MCH: 27.8 pg (ref 26.0–34.0)
MCHC: 31.9 g/dL (ref 30.0–36.0)
MCV: 87.1 fL (ref 80.0–100.0)
Platelets: 243 10*3/uL (ref 150–400)
RBC: 5.04 MIL/uL (ref 3.87–5.11)
RDW: 13.5 % (ref 11.5–15.5)
WBC: 9.2 10*3/uL (ref 4.0–10.5)
nRBC: 0 % (ref 0.0–0.2)

## 2019-12-14 LAB — POCT PREGNANCY, URINE: Preg Test, Ur: NEGATIVE

## 2019-12-14 SURGERY — DILATATION & CURETTAGE/HYSTEROSCOPY WITH MYOSURE
Anesthesia: General | Site: Vagina

## 2019-12-14 MED ORDER — DEXAMETHASONE SODIUM PHOSPHATE 10 MG/ML IJ SOLN
INTRAMUSCULAR | Status: DC | PRN
  Administered 2019-12-14: 10 mg via INTRAVENOUS

## 2019-12-14 MED ORDER — MIDAZOLAM HCL 2 MG/2ML IJ SOLN
INTRAMUSCULAR | Status: DC | PRN
  Administered 2019-12-14: 2 mg via INTRAVENOUS

## 2019-12-14 MED ORDER — KETOROLAC TROMETHAMINE 30 MG/ML IJ SOLN
INTRAMUSCULAR | Status: DC | PRN
  Administered 2019-12-14: 30 mg via INTRAVENOUS

## 2019-12-14 MED ORDER — DOCUSATE SODIUM 100 MG PO CAPS: 100.0000 mg | ORAL_CAPSULE | Freq: Two times a day (BID) | ORAL | 2 refills | Status: DC | PRN

## 2019-12-14 MED ORDER — NAPROXEN 500 MG PO TABS: 500.0000 mg | ORAL_TABLET | Freq: Two times a day (BID) | ORAL | 2 refills | Status: DC

## 2019-12-14 MED ORDER — MEPERIDINE HCL 25 MG/ML IJ SOLN: 6.2500 mg | INTRAMUSCULAR | Status: DC | PRN

## 2019-12-14 MED ORDER — BUPIVACAINE HCL 0.5 % IJ SOLN
INTRAMUSCULAR | Status: DC | PRN
  Administered 2019-12-14: 30 mL

## 2019-12-14 MED ORDER — ONDANSETRON HCL 4 MG/2ML IJ SOLN: 4.0000 mg | Freq: Once | INTRAMUSCULAR | Status: DC | PRN

## 2019-12-14 MED ORDER — OXYCODONE HCL 5 MG/5ML PO SOLN: 5.0000 mg | Freq: Once | ORAL | Status: DC | PRN

## 2019-12-14 MED ORDER — ACETAMINOPHEN 325 MG PO TABS: 325.0000 mg | ORAL_TABLET | ORAL | Status: DC | PRN

## 2019-12-14 MED ORDER — SCOPOLAMINE 1 MG/3DAYS TD PT72
MEDICATED_PATCH | TRANSDERMAL | Status: DC | PRN
  Administered 2019-12-14: 1 via TRANSDERMAL

## 2019-12-14 MED ORDER — POVIDONE-IODINE 10 % EX SWAB
2.0000 "application " | Freq: Once | CUTANEOUS | Status: AC
  Administered 2019-12-14: 2 via TOPICAL

## 2019-12-14 MED ORDER — OXYCODONE HCL 5 MG PO TABS: 5.0000 mg | ORAL_TABLET | Freq: Once | ORAL | Status: DC | PRN

## 2019-12-14 MED ORDER — PROPOFOL 10 MG/ML IV BOLUS
INTRAVENOUS | Status: AC
  Filled 2019-12-14: qty 20

## 2019-12-14 MED ORDER — FENTANYL CITRATE (PF) 100 MCG/2ML IJ SOLN: 25.0000 ug | INTRAMUSCULAR | Status: DC | PRN

## 2019-12-14 MED ORDER — FENTANYL CITRATE (PF) 250 MCG/5ML IJ SOLN
INTRAMUSCULAR | Status: AC
  Filled 2019-12-14: qty 5

## 2019-12-14 MED ORDER — LACTATED RINGERS IV SOLN: INTRAVENOUS | Status: DC

## 2019-12-14 MED ORDER — LIDOCAINE HCL (CARDIAC) PF 100 MG/5ML IV SOSY
PREFILLED_SYRINGE | INTRAVENOUS | Status: DC | PRN
  Administered 2019-12-14: 100 mg via INTRATRACHEAL

## 2019-12-14 MED ORDER — BUPIVACAINE HCL (PF) 0.5 % IJ SOLN
INTRAMUSCULAR | Status: AC
  Filled 2019-12-14: qty 30

## 2019-12-14 MED ORDER — IBUPROFEN 600 MG PO TABS: 600.0000 mg | ORAL_TABLET | Freq: Four times a day (QID) | ORAL | 2 refills | Status: DC | PRN

## 2019-12-14 MED ORDER — SODIUM CHLORIDE 0.9 % IR SOLN
Status: DC | PRN
  Administered 2019-12-14: 1000 mL

## 2019-12-14 MED ORDER — OXYCODONE HCL 5 MG PO TABS
5.0000 mg | ORAL_TABLET | ORAL | 0 refills | Status: DC | PRN
Start: 2019-12-14 — End: 2019-12-14

## 2019-12-14 MED ORDER — MIDAZOLAM HCL 2 MG/2ML IJ SOLN
INTRAMUSCULAR | Status: AC
  Filled 2019-12-14: qty 2

## 2019-12-14 MED ORDER — OXYCODONE HCL 5 MG PO TABS: 5.0000 mg | ORAL_TABLET | ORAL | 0 refills | Status: DC | PRN

## 2019-12-14 MED ORDER — LACTATED RINGERS IV SOLN: INTRAVENOUS | Status: DC | PRN

## 2019-12-14 MED ORDER — PROPOFOL 10 MG/ML IV BOLUS
INTRAVENOUS | Status: DC | PRN
  Administered 2019-12-14: 50 mg via INTRAVENOUS
  Administered 2019-12-14: 200 mg via INTRAVENOUS

## 2019-12-14 MED ORDER — FENTANYL CITRATE (PF) 250 MCG/5ML IJ SOLN
INTRAMUSCULAR | Status: DC | PRN
Start: 2019-12-14 — End: 2019-12-14
  Administered 2019-12-14: 50 ug via INTRAVENOUS

## 2019-12-14 MED ORDER — ONDANSETRON HCL 4 MG/2ML IJ SOLN
INTRAMUSCULAR | Status: DC | PRN
  Administered 2019-12-14: 4 mg via INTRAVENOUS

## 2019-12-14 MED ORDER — ORAL CARE MOUTH RINSE: 15.0000 mL | Freq: Once | OROMUCOSAL | Status: AC

## 2019-12-14 MED ORDER — ACETAMINOPHEN 160 MG/5ML PO SOLN: 325.0000 mg | ORAL | Status: DC | PRN

## 2019-12-14 MED ORDER — CHLORHEXIDINE GLUCONATE 0.12 % MT SOLN
15.0000 mL | Freq: Once | OROMUCOSAL | Status: AC
  Administered 2019-12-14: 15 mL via OROMUCOSAL

## 2019-12-14 MED FILL — ?IBUPROFEN 600 MG TABLETS: 600 | 7 days supply | Qty: 30 | Fill #0

## 2019-12-14 SURGICAL SUPPLY — 16 items
CANISTER SUCT 3000ML PPV (MISCELLANEOUS) ×3 IMPLANT
CATH ROBINSON RED A/P 16FR (CATHETERS) ×3 IMPLANT
DEVICE MYOSURE LITE (MISCELLANEOUS) IMPLANT
DEVICE MYOSURE REACH (MISCELLANEOUS) ×2 IMPLANT
GLOVE BIOGEL PI IND STRL 7.0 (GLOVE) ×2 IMPLANT
GLOVE BIOGEL PI INDICATOR 7.0 (GLOVE) ×4
GLOVE ECLIPSE 7.0 STRL STRAW (GLOVE) ×3 IMPLANT
GOWN STRL REUS W/ TWL LRG LVL3 (GOWN DISPOSABLE) ×2 IMPLANT
GOWN STRL REUS W/TWL LRG LVL3 (GOWN DISPOSABLE) ×6
KIT PROCEDURE FLUENT (KITS) ×3 IMPLANT
KIT TURNOVER KIT B (KITS) ×3 IMPLANT
PACK VAGINAL MINOR WOMEN LF (CUSTOM PROCEDURE TRAY) ×3 IMPLANT
PAD OB MATERNITY 4.3X12.25 (PERSONAL CARE ITEMS) ×3 IMPLANT
SEAL ROD LENS SCOPE MYOSURE (ABLATOR) ×3 IMPLANT
TOWEL GREEN STERILE FF (TOWEL DISPOSABLE) ×6 IMPLANT
UNDERPAD 30X36 HEAVY ABSORB (UNDERPADS AND DIAPERS) ×3 IMPLANT

## 2019-12-14 NOTE — Anesthesia Procedure Notes (Signed)
Procedure Name: LMA Insertion Date/Time: 12/14/2019 1:05 PM Performed by: Kizzie Furnish, RN Pre-anesthesia Checklist: Patient identified, Emergency Drugs available, Suction available and Patient being monitored Patient Re-evaluated:Patient Re-evaluated prior to induction Oxygen Delivery Method: Circle System Utilized Preoxygenation: Pre-oxygenation with 100% oxygen Induction Type: IV induction Ventilation: Mask ventilation without difficulty LMA: LMA inserted LMA Size: 4.0 Number of attempts: 1 Placement Confirmation: positive ETCO2 and breath sounds checked- equal and bilateral Tube secured with: Tape Dental Injury: Teeth and Oropharynx as per pre-operative assessment

## 2019-12-14 NOTE — H&P (Signed)
Preoperative History and Physical  Kristen [name] is a 34 y.o. G0P0 here for surgical management of abnormal uterine bleeding (AUB) and probable endometrial polyp seen on ultrasound. She was offered progestin IUD placement given her morbid obesity and risk of endometrial pathology, she declined this intervention.  No significant preoperative concerns.  Proposed surgery:  Hysteroscopy, dilation and curettage, polypectomy   Past Medical History:  Diagnosis Date  . Anxiety 2013   treated at [facility] by Pauline Good   . Asthma 1989    no hospitalization, or intubation, previously on advair and singulair. asthma worsening.   . 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 Depression 2001   depressed since childhood   . 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 Essential hypertension 03/12/2019  . GERD (gastroesophageal reflux disease)   . History of hiatal hernia   . History of suicidal ideation   . IBS (irritable bowel syndrome)   . Kyphosis of thoracic region 2004    painful, runs in dad side   . Migraine   . Pancreatitis 2003   gallstone pancreatitis   . Pancreatitis due to biliary obstruction 2003  . PCOS (polycystic ovarian syndrome) 2014   facial hair, irregular periods, never been pregnant   . Sleep apnea    no longer uses cpap   Past Surgical History:  Procedure Laterality Date  . BLADDER SURGERY    . CHOLECYSTECTOMY  2003   . OPEN REDUCTION INTERNAL FIXATION (ORIF) PROXIMAL PHALANX Right 07/23/2013   Procedure: OPEN TREATMENT RIGHT RING FINGER, PROXIMAL INTERPHALANGEAL/JOINT FRACTURE DISLOCATION;  Surgeon: Jolyn Nap, MD;  Location: [place];  Service: Orthopedics;  Laterality: Right;  . TONSILLECTOMY     and adenoidectomy   OB History  Gravida Para Term Preterm AB Living  0            SAB TAB Ectopic Multiple Live Births             Patient denies any other pertinent gynecologic issues.   No current facility-administered medications on file  prior to encounter.   Current Outpatient Medications on File Prior to Encounter  Medication Sig Dispense Refill  . albuterol (VENTOLIN HFA) 108 (90 Base) MCG/ACT inhaler Inhale 2 puffs into the lungs every 6 (six) hours as needed for wheezing or shortness of breath. 6.7 g 3  . megestrol (MEGACE) 40 MG tablet Take 1 tablet (40 mg total) by mouth 2 (two) times daily. Can increase to two tablets twice a day in the event of heavy bleeding 60 tablet 5  . naproxen (NAPROSYN) 500 MG tablet Take 1 tablet (500 mg total) by mouth 2 (two) times daily with a meal. As needed for pain and menstrual bleeding (Patient taking differently: Take 500 mg by mouth 2 (two) times daily as needed for moderate pain. ) 60 tablet 2  . Naproxen Sodium (ALEVE) 220 MG CAPS Take 220 mg by mouth daily as needed (headaches).    . propranolol (INDERAL) 20 MG tablet Take 1 tablet (20 mg total) by mouth 2 (two) times daily. For anxiety 60 tablet 2  . loperamide (IMODIUM A-D) 2 MG tablet Take 2 mg by mouth 4 (four) times daily as needed for diarrhea or loose stools.     Allergies  Allergen Reactions  . Mucinex [Guaifenesin Er] Hives, Itching and Swelling  . Penicillins Swelling    Did it involve swelling of the face/tongue/throat, SOB, or low BP? Yes  Did it involve sudden or severe rash/hives, skin peeling, or any reaction on the inside of your mouth or nose? No Did you need to seek medical attention at a hospital or doctor's office? Yes When did it last happen?>10 years ago If all above answers are "NO", may proceed with cephalosporin use.   . Contrast Media [Iodinated Diagnostic Agents] Nausea And Vomiting  . Latex Hives and Swelling  . Multihance [Gadobenate] Nausea And Vomiting  . Other     Pickles - throat swelling and nausea     Social History:   reports that she has never smoked. She has never used smokeless tobacco. She reports that she does not drink alcohol and does not use drugs.  Family History  Problem  Relation Age of Onset  . Hypertension Mother   . Arnold-Chiari malformation Mother   . Restless legs syndrome Mother   . Osteoporosis Mother   . COPD Mother   . Asthma Mother   . Squamous cell carcinoma Mother        skin   . Eczema Mother   . Arthritis Mother   . Peripheral Artery Disease Mother   . Anxiety disorder Mother   . OCD Mother   . Hypertension Father   . Scoliosis Father   . Bipolar disorder Father   . Congestive Heart Failure Father   . COPD Father   . Depression Father   . Diabetes Maternal Grandmother   . Hypertension Maternal Grandmother   . Dementia Maternal Grandmother   . OCD Maternal Grandmother   . Cancer Paternal Grandmother        colon  . Bone cancer Maternal Grandfather 82       bone marrow   . Multiple myeloma Maternal Grandfather   . Alcohol abuse Maternal Grandfather   . Drug abuse Maternal Grandfather   . Diabetes Paternal Grandfather   . Alcohol abuse Paternal Grandfather   . Drug abuse Paternal Grandfather   . Dementia Paternal Grandfather   . Alcohol abuse Maternal Aunt   . Depression Maternal Aunt   . Alcohol abuse Paternal Aunt   . Depression Paternal Aunt   . ADD / ADHD Cousin     Review of Systems: Pertinent items noted in HPI and remainder of comprehensive ROS otherwise negative.  PHYSICAL EXAM: Blood pressure 115/71, pulse 66, temperature 97.8 F (36.6 C), temperature source Oral, resp. rate 18, height _0  (1.676 m), weight (!) 149.7 kg, last menstrual period 11/19/2019, SpO2 97 %. CONSTITUTIONAL: Well-developed, well-nourished female in no acute distress.  HENT:  Normocephalic, atraumatic, External right and left ear normal.  EYES: Conjunctivae and EOM are normal. Pupils are equal, round, and reactive to light. No scleral icterus.  NECK: Normal range of motion, supple, no masses SKIN: Skin is warm and dry. No rash noted. Not diaphoretic. No erythema. No pallor. NEUROLOGIC: Alert and oriented to person, place, and time.  Normal reflexes, muscle tone coordination. No cranial nerve deficit noted. PSYCHIATRIC: Normal mood and affect. Normal behavior. Normal judgment and thought content. CARDIOVASCULAR: Normal heart rate noted, regular rhythm RESPIRATORY: Effort and breath sounds normal, no problems with respiration noted ABDOMEN: Soft, nontender, nondistended. PELVIC: Deferred MUSCULOSKELETAL: Normal range of motion. No edema and no tenderness. 2+ distal pulses.  Labs: Results for orders placed or performed during the hospital encounter of 12/14/19 (from the past 336 hour(s))  CBC   Collection Time: 12/14/19 10:32 AM  Result Value Ref Range   WBC 9.2 4.0 - 10.5 K/uL  RBC 5.04 3.87 - 5.11 MIL/uL   Hemoglobin 14.0 12.0 - 15.0 g/dL   HCT 43.9 36 - 46 %   MCV 87.1 80.0 - 100.0 fL   MCH 27.8 26.0 - 34.0 pg   MCHC 31.9 30.0 - 36.0 g/dL   RDW 13.5 11.5 - 15.5 %   Platelets 243 150 - 400 K/uL   nRBC 0.0 0.0 - 0.2 %  Basic metabolic panel per protocol   Collection Time: 12/14/19 10:32 AM  Result Value Ref Range   Sodium 139 135 - 145 mmol/L   Potassium 4.2 3.5 - 5.1 mmol/L   Chloride 109 98 - 111 mmol/L   CO2 23 22 - 32 mmol/L   Glucose, Bld 92 70 - 99 mg/dL   BUN 13 6 - 20 mg/dL   Creatinine, Ser 0.91 0.44 - 1.00 mg/dL   Calcium 9.0 8.9 - 10.3 mg/dL   GFR, Estimated >60 >60 mL/min   Anion gap 7 5 - 15  Pregnancy, urine POC   Collection Time: 12/14/19 10:47 AM  Result Value Ref Range   Preg Test, Ur NEGATIVE NEGATIVE  Results for orders placed or performed during the hospital encounter of 12/11/19 (from the past 336 hour(s))  SARS CORONAVIRUS 2 (TAT 6-24 HRS) Nasopharyngeal Nasopharyngeal Swab   Collection Time: 12/11/19  1:51 PM   Specimen: Nasopharyngeal Swab  Result Value Ref Range   SARS Coronavirus 2 NEGATIVE NEGATIVE    Imaging Studies: US PELVIC COMPLETE WITH TRANSVAGINAL  Result Date: 10/05/2019 CLINICAL DATA:  Painful heavy menses, history of polycystic ovarian syndrome, irregular  menses, LMP 09/18/2019 EXAM: TRANSABDOMINAL AND TRANSVAGINAL ULTRASOUND OF PELVIS TECHNIQUE: Both transabdominal and transvaginal ultrasound examinations of the pelvis were performed. Transabdominal technique was performed for global imaging of the pelvis including uterus, ovaries, adnexal regions, and pelvic cul-de-sac. It was necessary to proceed with endovaginal exam following the transabdominal exam to visualize the uterus, endometrium, and ovaries. COMPARISON:  05/27/2016 FINDINGS: Uterus Measurements: 7.2 x 3.6 x 5.7 cm = volume: 77 mL. Anteverted. Mildly heterogeneous myometrium. No focal mass. Nabothian cysts at cervix. Endometrium Thickness: 11 mm. Probable hyperechoic mass within endometrial canal measuring 11 x 6 x 9 mm likely an endometrial polyp. No endometrial fluid. Right ovary Measurements: 2.8 x 2.0 x 3.0 cm = volume: 8.6 mL. Normal morphology without mass Left ovary Measurements: 2.5 x 1.8 x 2.4 cm = volume: 5.8 mL. Normal morphology without mass Other findings No free pelvic fluid.  No adnexal masses. IMPRESSION: Probable endometrial polyp 11 x 6 x 9 mm; consider sonohysterogram for further evaluation, prior to hysteroscopy or endometrial biopsy. Remainder of exam unremarkable. Electronically Signed   By: Lavonia Dana M.D.   On: 10/05/2019 17:21    Assessment: Principal Problem:   Abnormal uterine bleeding (AUB) Active Problems:   Morbid obesity ([place])   Plan: Patient will undergo surgical management with Hysteroscopy, dilation and curettage, polypectomy.   The risks of surgery were discussed in detail with the patient including but not limited to: bleeding; infection which may require antibiotics; injury to surrounding organs which may involve uterus, bowel, bladder; need for additional procedures including laparoscopy or laparotomy or subsequent procedures secondary to abnormal pathology; thromboembolic phenomenon, surgical site problems and other postoperative/anesthesia complications.  Likelihood of success in alleviating the patient's condition was discussed; emphasized that this surgery may ameliorate the bleeding temporarily, but that risk of recurrence is high given her habitus and without progestin therapy (she again declined progestin IUD today due to concern about  side effects). Routine postoperative instructions will be reviewed with the patient and her family in detail after surgery.  The patient concurred with the proposed plan, giving informed written consent for the surgery.  Patient has been NPO since last night and she will remain NPO for procedure.  Anesthesia and OR aware.  Preoperative SCDs ordered on call to the OR.  To OR when ready.    Verita Schneiders, MD, [place] for [employer], [place]

## 2019-12-14 NOTE — Discharge Instructions (Signed)
Hysteroscopy, Care After This sheet gives you information about how to care for yourself after your procedure. Your health care provider may also give you more specific instructions. If you have problems or questions, contact your health care provider. What can I expect after the procedure? After the procedure, it is common to have:  Cramping.  Bleeding. This can vary from light spotting to menstrual-like bleeding. Follow these instructions at home: Activity  Rest for 1-2 days after the procedure.  Do not douche, use tampons, or have sex for 2 weeks after the procedure, or until your health care provider approves.  Do not drive for 24 hours after the procedure, or for as long as told by your health care provider.  Do not drive, use heavy machinery, or drink alcohol while taking prescription pain medicines. Medicines   Take over-the-counter and prescription medicines only as told by your health care provider.  Do not take aspirin during recovery. It can increase the risk of bleeding. General instructions  Do not take baths, swim, or use a hot tub until your health care provider approves. Take showers instead of baths for 2 weeks, or for as long as told by your health care provider.  To prevent or treat constipation while you are taking prescription pain medicine, your health care provider may recommend that you: ? Drink enough fluid to keep your urine clear or pale yellow. ? Take over-the-counter or prescription medicines. ? Eat foods that are high in fiber, such as fresh fruits and vegetables, whole grains, and beans. ? Limit foods that are high in fat and processed sugars, such as fried and sweet foods.  Keep all follow-up visits as told by your health care provider. This is important. Contact a health care provider if:  You feel dizzy or lightheaded.  You feel nauseous.  You have abnormal vaginal discharge.  You have a rash.  You have pain that does not get better with  medicine.  You have chills. Get help right away if:  You have bleeding that is heavier than a normal menstrual period.  You have a fever.  You have pain or cramps that get worse.  You develop new abdominal pain.  You faint.  You have pain in your shoulders.  You have shortness of breath. Summary  After the procedure, you may have cramping and some vaginal bleeding.  Do not douche, use tampons, or have sex for 2 weeks after the procedure, or until your health care provider approves.  Do not take baths, swim, or use a hot tub until your health care provider approves. Take showers instead of baths for 2 weeks, or for as long as told by your health care provider.  Report any unusual symptoms to your health care provider.  Keep all follow-up visits as told by your health care provider. This is important. This information is not intended to replace advice given to you by your health care provider. Make sure you discuss any questions you have with your health care provider. Document Revised: 01/03/2017 Document Reviewed: 02/20/2016 Elsevier Patient Education  2020 Elsevier Inc.  

## 2019-12-14 NOTE — Telephone Encounter (Signed)
[facility] received a prescription for Oxycodone for this patient and this pharmacy does not dispense narcotics.  Patient's mother indicates that their preferred pharmacy for this medication is [facility] on [address].  The rx sent to [facility] has been deleted as narcotics can not be transferred.

## 2019-12-14 NOTE — Op Note (Signed)
PREOPERATIVE DIAGNOSIS:  Abnormal uterine bleeding, endometrial polyp POSTOPERATIVE DIAGNOSIS: The same PROCEDURE: Hysteroscopy, Dilation and Curettage, Polypectomy using Myosure SURGEON:  Dr. Jaynie Collins   INDICATIONS: 34 y.o. G0P0  here for scheduled surgery for the aforementioned diagnoses.   Risks of surgery were discussed with the patient including but not limited to: bleeding which may require transfusion; infection which may require antibiotics; injury to uterus or surrounding organs; intrauterine scarring which may impair future fertility; need for additional procedures including laparotomy or laparoscopy; and other postoperative/anesthesia complications. Written informed consent was obtained.    FINDINGS:  A 7 week size uterus.  Significantly diffusely proliferative endometrium. 1.5 cm x 1 cm polypoid lesion noted on the posterior fundal area, removed using Myosure device. Normal ostia bilaterally.  ANESTHESIA:   General, paracervical block with 30 ml of 0.5% Marcaine FLUID DEFICITS:  170 ml of NS ESTIMATED BLOOD LOSS:  25 ml SPECIMENS: Polyp fragments and endometrial curettings sent to pathology COMPLICATIONS:  None immediate.  PROCEDURE DETAILS:  The patient was then taken to the operating room where general anesthesia was administered and was found to be adequate.  After an adequate timeout was performed, she was placed in the dorsal lithotomy position and examined; then prepped and draped in the sterile manner.   Her bladder was catheterized for an unmeasured amount of clear, yellow urine. A speculum was then placed in the patient's vagina and a single tooth tenaculum was applied to the anterior lip of the cervix.   A paracervical block using 30 ml of 0.5% Marcaine was administered.  The uterus was sounded to 7 cm and the cervix was dilated manually with metal dilators to accommodate the 5.5 mm Myosure hysteroscope.  The hysteroscope was then inserted under direct visualization using  NS as a suspension medium.  The uterine cavity was carefully examined with the findings as noted above.   After further careful visualization of the uterine cavity, the Myosure REACH device was used to excise the polypoid lesion.  The hysteroscope and Myosure device were then removed under direct visualization.  A sharp curettage was then performed to obtain a moderate amount of endometrial curettings.  The tenaculum was removed from the anterior lip of the cervix and the vaginal speculum was removed after noting good hemostasis.  The patient tolerated the procedure well and was taken to the recovery area awake, extubated and in stable condition.  The patient will be discharged to home as per PACU criteria.  Routine postoperative instructions given.  She was prescribed Oxycodone, Ibuprofen and Colace.  She will follow up in the office on 01/07/2020 for postoperative evaluation.    Jaynie Collins, MD, FACOG Obstetrician & Gynecologist, [facility] for [employer], [facility]

## 2019-12-14 NOTE — Anesthesia Postprocedure Evaluation (Signed)
Anesthesia Post Note  Patient: MADDELINE ROORDA  Procedure(s) Performed: DILATATION & CURETTAGE/HYSTEROSCOPY WITH Polypectomy with MYOSURE (N/A Vagina )     Patient location during evaluation: PACU Anesthesia Type: General Level of consciousness: awake and alert Pain management: pain level controlled Vital Signs Assessment: post-procedure vital signs reviewed and stable Respiratory status: spontaneous breathing, nonlabored ventilation, respiratory function stable and patient connected to nasal cannula oxygen Cardiovascular status: blood pressure returned to baseline and stable Postop Assessment: no apparent nausea or vomiting Anesthetic complications: no   No complications documented.  Last Vitals:  Vitals:   12/14/19 1430 12/14/19 1445  BP: (!) 110/94 118/68  Pulse: (!) 59 (!) 48  Resp: 14 15  Temp:  (!) 36.2 C  SpO2: 97% 98%    Last Pain:  Vitals:   12/14/19 1445  TempSrc:   PainSc: 0-No pain                 Octavious Zidek

## 2019-12-14 NOTE — Transfer of Care (Signed)
Immediate Anesthesia Transfer of Care Note  Patient: Kristen [name]  Procedure(s) Performed: DILATATION & CURETTAGE/HYSTEROSCOPY WITH Polypectomy with MYOSURE (N/A Vagina )  Patient Location: PACU  Anesthesia Type:General  Level of Consciousness: drowsy and patient cooperative  Airway & Oxygen Therapy: Patient Spontanous Breathing  Post-op Assessment: Report given to RN and Post -op Vital signs reviewed and stable  Post vital signs: Reviewed and stable  Last Vitals:  Vitals Value Taken Time  BP    Temp    Pulse 73 12/14/19 1359  Resp 17 12/14/19 1359  SpO2 94 % 12/14/19 1359  Vitals shown include unvalidated device data.  Last Pain:  Vitals:   12/14/19 1123  TempSrc:   PainSc: 0-No pain         Complications: No complications documented.

## 2019-12-14 NOTE — Anesthesia Preprocedure Evaluation (Addendum)
Anesthesia Evaluation  Patient identified by MRN, date of birth, ID band Patient awake    Reviewed: Allergy & Precautions, NPO status , Patient's Chart, lab work & pertinent test results  Airway Mallampati: II  TM Distance: >3 FB Neck ROM: Full    Dental no notable dental hx. (+) Teeth Intact, Dental Advisory Given,    Pulmonary asthma , sleep apnea ,    Pulmonary exam normal breath sounds clear to auscultation       Cardiovascular hypertension, Pt. on home beta blockers negative cardio ROS Normal cardiovascular exam+ dysrhythmias  Rhythm:Regular Rate:Normal     Neuro/Psych  Headaches, PSYCHIATRIC DISORDERS Anxiety Depression negative neurological ROS  negative psych ROS   GI/Hepatic negative GI ROS, Neg liver ROS, hiatal hernia, GERD  ,  Endo/Other  Morbid obesity  Renal/GU Renal diseasenegative Renal ROS  negative genitourinary   Musculoskeletal negative musculoskeletal ROS (+)   Abdominal (+) + obese,   Peds negative pediatric ROS (+)  Hematology negative hematology ROS (+)   Anesthesia Other Findings   Reproductive/Obstetrics negative OB ROS                            Anesthesia Physical Anesthesia Plan  ASA: III  Anesthesia Plan: General   Post-op Pain Management:    Induction: Intravenous  PONV Risk Score and Plan: 3 and Ondansetron, Dexamethasone and Treatment may vary due to age or medical condition  Airway Management Planned: LMA  Additional Equipment:   Intra-op Plan:   Post-operative Plan:   Informed Consent:   Plan Discussed with: CRNA, Surgeon and Anesthesiologist  Anesthesia Plan Comments: ( )       Anesthesia Quick Evaluation

## 2019-12-15 ENCOUNTER — Encounter (HOSPITAL_COMMUNITY): Payer: Self-pay | Admitting: Obstetrics & Gynecology

## 2019-12-15 LAB — SURGICAL PATHOLOGY

## 2019-12-29 ENCOUNTER — Telehealth: Payer: Self-pay

## 2019-12-29 ENCOUNTER — Encounter: Payer: Self-pay | Admitting: Internal Medicine

## 2019-12-29 NOTE — Telephone Encounter (Signed)
Contacted pt and made aware that there are no avaiable appts today. Made pt aware that she can do a MyChart video or she can go to the urgent care. Pt states she understands and doesn't have any questions or concerns

## 2020-01-07 ENCOUNTER — Encounter: Payer: Self-pay | Admitting: Obstetrics & Gynecology

## 2020-01-07 ENCOUNTER — Ambulatory Visit: Payer: No Payment, Other | Admitting: Clinical

## 2020-01-07 ENCOUNTER — Ambulatory Visit (INDEPENDENT_AMBULATORY_CARE_PROVIDER_SITE_OTHER): Payer: Self-pay | Admitting: Obstetrics & Gynecology

## 2020-01-07 ENCOUNTER — Other Ambulatory Visit: Payer: Self-pay

## 2020-01-07 VITALS — BP 101/50 | HR 80 | Ht 66.0 in | Wt 327.0 lb

## 2020-01-07 DIAGNOSIS — F32A Depression, unspecified: Secondary | ICD-10-CM

## 2020-01-07 DIAGNOSIS — Z09 Encounter for follow-up examination after completed treatment for conditions other than malignant neoplasm: Secondary | ICD-10-CM

## 2020-01-07 DIAGNOSIS — Z658 Other specified problems related to psychosocial circumstances: Secondary | ICD-10-CM

## 2020-01-07 DIAGNOSIS — F419 Anxiety disorder, unspecified: Secondary | ICD-10-CM

## 2020-01-07 NOTE — BH Specialist Note (Signed)
Integrated Behavioral Health Follow Up In-Person Visit  MRN: [id number] Name: Zyona Pettaway  Brief mood check f/u in exam room; Pt experiencing many life stressors (job loss, family members with cancer diagnosis, financial stress; going through disability process, etc.); pt denies SI, denies intent and plan, is currently being treated via psychiatry.   Pt agrees for [facility] Glessie Eustice to call in about a week for a brief check-in.   Valetta Close Chaniah Cisse, LCSW

## 2020-01-07 NOTE — Progress Notes (Signed)
   POSTOPERATIVE VISIT  Subjective:     Kristen [name] is a 34 y.o. female who presents to the clinic 3 weeks status post Hysteroscopy, Dilation and Curettage, Polypectomy using Myosure for abnormal uterine bleeding, endometrial polyp. Eating a regular diet without difficulty. Bowel movements are normal. The patient is not having any pain.  She does endorse a lot of anxiety and depression, PHQ-9 score today 21.  The following portions of the patient's history were reviewed and updated as appropriate: allergies, current medications, past family history, past medical history, past social history, past surgical history and problem list. Normal pap with negative HPV on 03/10/2019.  Review of Systems Pertinent items noted in HPI and remainder of comprehensive ROS otherwise negative.    Objective:    BP (!) 101/50   Pulse 80   Ht 5\' 6"  (1.676 m)   Wt (!) 327 lb (148.3 kg)   BMI 52.78 kg/m  General:  alert and no distress  Abdomen: soft, bowel sounds active, non-tender  Pelvic:   deferred   12/14/2019 Surgical Pathology  ENDOMETRIUM, CURETTAGE AND POLYPECTOMY:  -  Benign endometrial polyp  -  No hyperplasia or malignancy identified     Assessment:   Doing well postoperatively. But increased anxiety and depression. Operative findings again reviewed. Pathology report discussed.    Plan:     1. Continue any current medications. 2. Discussed that if AUB continues, progestin therapy (IUD preferred) is recommended, also helps in endometrial cancer prophylaxis given her morbid obesity. 3. Activity restrictions: none 4. Seen by [facility] Clinician today given elevated PHQ-9, patient has own mental health provider and close follow up. Reports a lot of stress at home. Denies HI/SI. 5. Follow up for any GYN concerns as needed.      [facility], MD, FACOG Obstetrician & Gynecologist, [facility] for [facility]

## 2020-01-14 ENCOUNTER — Telehealth: Payer: Self-pay | Admitting: Clinical

## 2020-01-14 NOTE — Telephone Encounter (Signed)
Left HIPPA-compliant message to call back Asher Muir from [facility] for [employer] at [facility] at [phone] (main office) or [phone] (Demitra Danley's office).

## 2020-02-15 ENCOUNTER — Encounter (HOSPITAL_COMMUNITY): Payer: Self-pay | Admitting: Psychiatry

## 2020-02-15 ENCOUNTER — Telehealth (INDEPENDENT_AMBULATORY_CARE_PROVIDER_SITE_OTHER): Payer: No Payment, Other | Admitting: Psychiatry

## 2020-02-15 ENCOUNTER — Other Ambulatory Visit: Payer: Self-pay

## 2020-02-15 ENCOUNTER — Other Ambulatory Visit (HOSPITAL_COMMUNITY): Payer: Self-pay | Admitting: Psychiatry

## 2020-02-15 DIAGNOSIS — F4010 Social phobia, unspecified: Secondary | ICD-10-CM | POA: Diagnosis not present

## 2020-02-15 DIAGNOSIS — F419 Anxiety disorder, unspecified: Secondary | ICD-10-CM

## 2020-02-15 DIAGNOSIS — F331 Major depressive disorder, recurrent, moderate: Secondary | ICD-10-CM | POA: Diagnosis not present

## 2020-02-15 DIAGNOSIS — F32A Depression, unspecified: Secondary | ICD-10-CM

## 2020-02-15 MED ORDER — TRAZODONE HCL 50 MG PO TABS: 50.0000 mg | ORAL_TABLET | Freq: Every evening | ORAL | 2 refills | Status: DC | PRN

## 2020-02-15 MED ORDER — QUETIAPINE FUMARATE 100 MG PO TABS: 100.0000 mg | ORAL_TABLET | Freq: Every day | ORAL | 2 refills | Status: DC

## 2020-02-15 MED ORDER — HYDROXYZINE HCL 25 MG PO TABS: 25.0000 mg | ORAL_TABLET | Freq: Three times a day (TID) | ORAL | 2 refills | Status: DC | PRN

## 2020-02-15 MED ORDER — SERTRALINE HCL 100 MG PO TABS: 100.0000 mg | ORAL_TABLET | Freq: Every day | ORAL | 2 refills | Status: DC

## 2020-02-15 MED ORDER — BUSPIRONE HCL 15 MG PO TABS: 15.0000 mg | ORAL_TABLET | Freq: Three times a day (TID) | ORAL | 2 refills | Status: DC

## 2020-02-15 MED FILL — hydrOXYzine HCL 25 MG TABS: 25 | 30 days supply | Qty: 90 | Fill #0

## 2020-02-15 MED FILL — ?TRAZODONE HCL 50 TABS: 50 | 30 days supply | Qty: 30 | Fill #0

## 2020-02-15 MED FILL — QUETIAPINE FUMARATE 100 MG: 100 | 30 days supply | Qty: 30 | Fill #0

## 2020-02-15 MED FILL — ?BUSPIRONE HCL 15MG TABS: 15 | 30 days supply | Qty: 90 | Fill #0

## 2020-02-15 MED FILL — ?SERTRALINE HCL 100 MG TABS: 100 | 30 days supply | Qty: 30 | Fill #0
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
## 2020-03-08 ENCOUNTER — Other Ambulatory Visit: Payer: Self-pay

## 2020-03-08 ENCOUNTER — Ambulatory Visit: Payer: Self-pay | Attending: Internal Medicine

## 2020-03-24 ENCOUNTER — Encounter: Payer: Self-pay | Admitting: Internal Medicine

## 2020-03-24 ENCOUNTER — Other Ambulatory Visit: Payer: Self-pay | Admitting: Internal Medicine

## 2020-03-24 ENCOUNTER — Other Ambulatory Visit: Payer: Self-pay

## 2020-03-24 ENCOUNTER — Ambulatory Visit: Payer: Self-pay | Attending: Internal Medicine | Admitting: Internal Medicine

## 2020-03-24 VITALS — BP 120/84 | HR 68 | Resp 16 | Ht 66.5 in | Wt 317.0 lb

## 2020-03-24 DIAGNOSIS — G2581 Restless legs syndrome: Secondary | ICD-10-CM

## 2020-03-24 DIAGNOSIS — G8929 Other chronic pain: Secondary | ICD-10-CM

## 2020-03-24 DIAGNOSIS — N911 Secondary amenorrhea: Secondary | ICD-10-CM

## 2020-03-24 DIAGNOSIS — M546 Pain in thoracic spine: Secondary | ICD-10-CM

## 2020-03-24 MED ORDER — GABAPENTIN 300 MG PO CAPS: 300.0000 mg | ORAL_CAPSULE | Freq: Every day | ORAL | 3 refills | Status: DC

## 2020-03-24 MED ORDER — DICLOFENAC SODIUM 1 % EX GEL: 2.0000 g | Freq: Four times a day (QID) | CUTANEOUS | 1 refills | Status: DC

## 2020-03-24 MED FILL — DICLOFENAC SODIUM 1% GEL: 1 | 12 days supply | Qty: 100 | Fill #0

## 2020-03-24 MED FILL — GABAPENTIN 300 MG CAPSULE: 300 | 30 days supply | Qty: 30 | Fill #0

## 2020-03-24 NOTE — Progress Notes (Signed)
Patient ID: Kristen [name], female    DOB: 04/24/1985  MRN: [id number]  CC: Anxiety, Depression, and Hypertension   Subjective: Kristen [name] is a 35 y.o. female who presents for chronic ds mangement.  Mom is with her. Her concerns today include:  Pt with hx ofanx/dep, IBS,  PCOS, morbid obesity, Dep, RLS, social phobia, lymphocytic hypophysitis, hyperprolactinemia, severe persistent asthma, migraines, OSA CPAP.  Depression/anxiety: In process of trying to get SSI Staying home Stll seeing a therapist/NP who does med management Doing better since not in such a busy enviroment. Tends to over think things Loss aunt to pancreatic CA.  Pt has had panreatitis at age 41  C/o restless legs at nights.  Reports that she has to get up and walk up pace.  "I can't sit still." Constantly having to move them.  Her mother wanted me to know that mom and father are intolerant of Lyrica and gabapentin and that the patient may be to.  She wanted to let me know this in case we were planning to prescribe any of those medicines for her for this condition.    Also complains of pain in the upper to mid thoracic spine in the midline for many years but getting worse.  It is most bothersome when she tries to sit upright.  She finds herself slouching because of the feeling of pressure in that area when she tries to sit with correct posture.  Bra size has increased over the years.  She now wears a size H.  She thinks this puts a lot of pressure on her back also.  She was diagnosed with kyphosis and scoliosis by her chiropractor when she was a teenager.  Had [facility] 12/08/2019 for DUB No menses since then Sexually active, not on BC.  Trying to get pregnant.   Patient Active Problem List   Diagnosis Date Noted  . Morbid obesity ([place]) 12/14/2019  . Endometrial polyp   . Moderate episode of recurrent major depressive disorder ([place]) 11/18/2019  . Encounter for autism screening 11/18/2019  . Abnormal uterine  bleeding (AUB) 11/08/2019  . Severe episode of recurrent major depressive disorder, with psychotic features ([place]) 09/20/2019  . PTSD (post-traumatic stress disorder) 09/20/2019  . 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 Abnormal serum thyroid stimulating hormone (TSH) level 04/23/2019  . MDD (major depressive disorder), recurrent severe, without psychosis ([place]) 04/15/2019  . OSA on CPAP 03/17/2019  . Anxiety and depression 03/12/2019  . Chronic migraine with aura 03/12/2019  . Essential hypertension 03/12/2019  . Mild intermittent asthma without complication 95/62/1308  . 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 Elevated prolactin level 07/05/2016  . Vulvar lesion 07/05/2016  . Restless legs 05/24/2016  . Seasonal allergies 05/13/2014  . Intertrigo 05/13/2014  . Decreased vision 05/13/2014  . PCOS (polycystic ovarian syndrome) 02/07/2014  . Social phobia 02/02/2007  . ADD 02/02/2007  . 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 Asthma, severe persistent 02/02/2007  . SCOLIOSIS 02/02/2007  . OTHER SPECIFIED CONGENITAL ANOMALIES OF KIDNEY 02/02/2007  . ELECTROCARDIOGRAM, ABNORMAL 02/02/2007     Current Outpatient Medications on File Prior to Visit  Medication Sig Dispense Refill  . albuterol (VENTOLIN HFA) 108 (90 Base) MCG/ACT inhaler Inhale 2 puffs into the lungs every 6 (six) hours as needed for wheezing or shortness of breath. 6.7 g 3  . busPIRone (BUSPAR) 15 MG tablet Take 1 tablet (15 mg total) by mouth 3 (three) times daily. For anxiety 90 tablet 2  . hydrOXYzine (ATARAX/VISTARIL) 25  MG tablet Take 1 tablet (25 mg total) by mouth 3 (three) times daily as needed for anxiety. 90 tablet 2  . propranolol (INDERAL) 20 MG tablet Take 1 tablet (20 mg total) by mouth 2 (two) times daily. For anxiety 60 tablet 2  . QUEtiapine (SEROQUEL) 100 MG tablet Take 1 tablet (100 mg total) by mouth at bedtime. 30 tablet 2  . sertraline (ZOLOFT) 100 MG tablet Take 1 tablet (100 mg total) by mouth  daily. For depression 30 tablet 2  . traZODone (DESYREL) 50 MG tablet Take 1 tablet (50 mg total) by mouth at bedtime as needed for sleep. 30 tablet 2  . ibuprofen (ADVIL) 600 MG tablet Take 1 tablet (600 mg total) by mouth every 6 (six) hours as needed for headache, mild pain, moderate pain or cramping. (Patient not taking: Reported on 03/24/2020) 30 tablet 2  . loperamide (IMODIUM A-D) 2 MG tablet Take 2 mg by mouth 4 (four) times daily as needed for diarrhea or loose stools. (Patient not taking: Reported on 03/24/2020)    . naproxen (NAPROSYN) 500 MG tablet Take 1 tablet (500 mg total) by mouth 2 (two) times daily with a meal. As needed for pain and menstrual bleeding (Patient not taking: Reported on 03/24/2020) 60 tablet 2   No current facility-administered medications on file prior to visit.    Allergies  Allergen Reactions  . Mucinex [Guaifenesin Er] Hives, Itching and Swelling  . Penicillins Swelling    Did it involve swelling of the face/tongue/throat, SOB, or low BP? Yes Did it involve sudden or severe rash/hives, skin peeling, or any reaction on the inside of your mouth or nose? No Did you need to seek medical attention at a hospital or doctor's office? Yes When did it last happen?>10 years ago If all above answers are "NO", may proceed with cephalosporin use.   . Contrast Media [Iodinated Diagnostic Agents] Nausea And Vomiting  . Latex Hives and Swelling  . Multihance [Gadobenate] Nausea And Vomiting  . Other     Pickles - throat swelling and nausea     Social History   Socioeconomic History  . Marital status: Single    Spouse name: Not on file  . Number of children: 0  . Years of education: college   . Highest education level: Not on file  Occupational History  . Occupation: Unemployed   Tobacco Use  . Smoking status: Never Smoker  . Smokeless tobacco: Never Used  Vaping Use  . Vaping Use: Never used  Substance and Sexual Activity  . Alcohol use: No  . Drug  use: No  . Sexual activity: Yes    Birth control/protection: None  Other Topics Concern  . Not on file  Social History Narrative   Lives with mom [name])    Right-handed   Caffeine: 3 cups of coffee per day   Social Determinants of Health   Financial Resource Strain: Not on file  Food Insecurity: Food Insecurity Present  . Worried About Charity fundraiser in the Last Year: Sometimes true  . Ran Out of Food in the Last Year: Sometimes true  Transportation Needs: No Transportation Needs  . Lack of Transportation (Medical): No  . Lack of Transportation (Non-Medical): No  Physical Activity: Not on file  Stress: Not on file  Social Connections: Not on file  Intimate Partner Violence: Not on file    Family History  Problem Relation Age of Onset  . Hypertension Mother   . Arnold-Chiari malformation  Mother   . Restless legs syndrome Mother   . Osteoporosis Mother   . COPD Mother   . Asthma Mother   . Squamous cell carcinoma Mother        skin   . Eczema Mother   . Arthritis Mother   . Peripheral Artery Disease Mother   . Anxiety disorder Mother   . OCD Mother   . Hypertension Father   . Scoliosis Father   . Bipolar disorder Father   . Congestive Heart Failure Father   . COPD Father   . Depression Father   . Diabetes Maternal Grandmother   . Hypertension Maternal Grandmother   . Dementia Maternal Grandmother   . OCD Maternal Grandmother   . Cancer Paternal Grandmother        colon  . Bone cancer Maternal Grandfather 82       bone marrow   . Multiple myeloma Maternal Grandfather   . Alcohol abuse Maternal Grandfather   . Drug abuse Maternal Grandfather   . Diabetes Paternal Grandfather   . Alcohol abuse Paternal Grandfather   . Drug abuse Paternal Grandfather   . Dementia Paternal Grandfather   . Alcohol abuse Maternal Aunt   . Depression Maternal Aunt   . Alcohol abuse Paternal Aunt   . Depression Paternal Aunt   . ADD / ADHD Cousin     Past Surgical  History:  Procedure Laterality Date  . BLADDER SURGERY    . CHOLECYSTECTOMY  2003   . DILATATION & CURETTAGE/HYSTEROSCOPY WITH MYOSURE N/A 12/14/2019   Procedure: DILATATION & CURETTAGE/HYSTEROSCOPY WITH Polypectomy with MYOSURE;  Surgeon: Osborne Oman, MD;  Location: [place];  Service: Gynecology;  Laterality: N/A;  . OPEN REDUCTION INTERNAL FIXATION (ORIF) PROXIMAL PHALANX Right 07/23/2013   Procedure: OPEN TREATMENT RIGHT RING FINGER, PROXIMAL INTERPHALANGEAL/JOINT FRACTURE DISLOCATION;  Surgeon: Jolyn Nap, MD;  Location: [place];  Service: Orthopedics;  Laterality: Right;  . TONSILLECTOMY     and adenoidectomy    ROS: Review of Systems Negative except as stated above  PHYSICAL EXAM: BP (!) 142/93   Pulse 68   Resp 16   Ht 5' 6.5" (1.689 m)   Wt (!) 317 lb (143.8 kg)   SpO2 96%   BMI 50.40 kg/m   Physical Exam  General appearance - alert, well appearing, obese young Caucasian female and in no distress Mental status - normal mood, behavior, speech, dress, motor activity, and thought processes Chest - clear to auscultation, no wheezes, rales or rhonchi, symmetric air entry Heart - normal rate, regular rhythm, normal S1, S2, no murmurs, rubs, clicks or gallops Musculoskeletal -slight tenderness on palpation of the thoracic spine just below the bra strap.  Difficult to appreciate curvature of her spine due to large body habitus.   CMP Latest Ref Rng & Units 12/14/2019 04/15/2019 04/15/2019  Glucose 70 - 99 mg/dL 92 - 91  BUN 6 - 20 mg/dL 13 - 14  Creatinine 0.44 - 1.00 mg/dL 0.91 - 0.86  Sodium 135 - 145 mmol/L 139 - 139  Potassium 3.5 - 5.1 mmol/L 4.2 - 3.9  Chloride 98 - 111 mmol/L 109 - 107  CO2 22 - 32 mmol/L 23 - 23  Calcium 8.9 - 10.3 mg/dL 9.0 - 9.1  Total Protein 6.5 - 8.1 g/dL - 7.0 6.7  Total Bilirubin 0.3 - 1.2 mg/dL - 0.5 0.7  Alkaline Phos 38 - 126 U/L - 111 112  AST 15 - 41 U/L - 20 20  ALT 0 - 44 U/L - 22 22   Lipid Panel      Component Value Date/Time   CHOL 204 (H) 04/15/2019 0624   TRIG 146 04/15/2019 0624   HDL 41 04/15/2019 0624   CHOLHDL 5.0 04/15/2019 0624   VLDL 29 04/15/2019 0624   LDLCALC 134 (H) 04/15/2019 0624    CBC    Component Value Date/Time   WBC 9.2 12/14/2019 1032   RBC 5.04 12/14/2019 1032   HGB 14.0 12/14/2019 1032   HGB 13.7 07/22/2017 1339   HCT 43.9 12/14/2019 1032   HCT 41.3 07/22/2017 1339   PLT 243 12/14/2019 1032   PLT 267 07/22/2017 1339   MCV 87.1 12/14/2019 1032   MCV 88 07/22/2017 1339   MCH 27.8 12/14/2019 1032   MCHC 31.9 12/14/2019 1032   RDW 13.5 12/14/2019 1032   RDW 13.9 07/22/2017 1339   LYMPHSABS 3.3 (H) 07/22/2017 1339   MONOABS 0.5 01/08/2017 0950   EOSABS 0.5 (H) 07/22/2017 1339   BASOSABS 0.0 07/22/2017 1339    ASSESSMENT AND PLAN: 1. Chronic midline thoracic back pain Differential diagnosis include DJD, DDD, mechanical strain from large breasts. We will get an x-ray and depending on results likely refer her for some physical therapy.  Once she has insurance she may consider breast reduction. - DG Thoracic Spine W/Swimmers; Future - diclofenac Sodium (VOLTAREN) 1 % GEL; Apply 2 g topically 4 (four) times daily.  Dispense: 100 g; Refill: 1  2. Restless legs CBC is normal.  Discussed treatment of restless leg syndrome.  Usually I use Requip but have informed that it can sometimes cause compulsive behaviors so patient preferred to go with the gabapentin.  She will let me know if she has any issues with the medication - gabapentin (NEURONTIN) 300 MG capsule; Take 1 capsule (300 mg total) by mouth at bedtime.  Dispense: 30 capsule; Refill: 3  3. Secondary amenorrhea If urine pregnancy test is negative.  I recommend that she touch base with the gynecologist for further recommendations. - POCT urine pregnancy    Patient was given the opportunity to ask questions.  Patient verbalized understanding of the plan and was able to repeat key elements of the  plan.   No orders of the defined types were placed in this encounter.    Requested Prescriptions    No prescriptions requested or ordered in this encounter    No follow-ups on file.  Karle Plumber, MD, FACP

## 2020-03-27 LAB — POCT URINE PREGNANCY: Preg Test, Ur: NEGATIVE

## 2020-03-30 ENCOUNTER — Ambulatory Visit (HOSPITAL_COMMUNITY)
Admission: RE | Admit: 2020-03-30 | Discharge: 2020-03-30 | Disposition: A | Discharge status: Home / Self Care | Payer: Self-pay | Source: Ambulatory Visit | Attending: Internal Medicine | Admitting: Internal Medicine

## 2020-03-30 ENCOUNTER — Other Ambulatory Visit: Payer: Self-pay

## 2020-03-30 DIAGNOSIS — M546 Pain in thoracic spine: Secondary | ICD-10-CM | POA: Insufficient documentation

## 2020-03-30 DIAGNOSIS — G8929 Other chronic pain: Secondary | ICD-10-CM | POA: Insufficient documentation

## 2020-04-02 ENCOUNTER — Other Ambulatory Visit: Payer: Self-pay | Admitting: Internal Medicine

## 2020-04-02 DIAGNOSIS — G8929 Other chronic pain: Secondary | ICD-10-CM

## 2020-04-10 ENCOUNTER — Ambulatory Visit: Payer: Self-pay | Admitting: Physical Therapy

## 2020-04-11 ENCOUNTER — Ambulatory Visit: Payer: Self-pay | Admitting: Physical Therapy

## 2020-04-19 ENCOUNTER — Encounter: Payer: Self-pay | Admitting: Internal Medicine

## 2020-04-19 ENCOUNTER — Other Ambulatory Visit: Payer: Self-pay | Admitting: Physician Assistant

## 2020-04-19 MED ORDER — NAPROXEN 500 MG PO TABS: 500.0000 mg | ORAL_TABLET | Freq: Two times a day (BID) | ORAL | 1 refills | Status: DC

## 2020-04-28 ENCOUNTER — Encounter: Payer: Self-pay | Admitting: Physical Therapy

## 2020-04-28 ENCOUNTER — Ambulatory Visit (INDEPENDENT_AMBULATORY_CARE_PROVIDER_SITE_OTHER): Payer: Self-pay | Admitting: Physical Therapy

## 2020-04-28 ENCOUNTER — Other Ambulatory Visit: Payer: Self-pay

## 2020-04-28 DIAGNOSIS — R293 Abnormal posture: Secondary | ICD-10-CM

## 2020-04-28 DIAGNOSIS — M6281 Muscle weakness (generalized): Secondary | ICD-10-CM

## 2020-04-28 DIAGNOSIS — M546 Pain in thoracic spine: Secondary | ICD-10-CM

## 2020-04-28 NOTE — Patient Instructions (Signed)
Access Code: [email or web address] URL: [email or web address] Date: 04/28/2020 Prepared by: Moshe Cipro  Exercises Doorway Pec Stretch at 90 Degrees Abduction - 1 x daily - 7 x weekly - 3 reps - 1 sets - 30 sec hold Sidelying Thoracic Rotation with Open Book - 1 x daily - 7 x weekly - 3 sets - 10 reps Prone W Scapular Retraction - 1 x daily - 7 x weekly - 3 sets - 10 reps Squat with Medicine [employer] - 1 x daily - 7 x weekly - 3 sets - 10 reps

## 2020-04-28 NOTE — Therapy (Signed)
[facility] [address] [place], [place], [id number] Phone: [phone]   Fax:  [phone]  Physical Therapy Evaluation  Patient Details  Name: Kristen [name] MRN: [id number] Date of Birth: 09/20/1985 Referring Provider (PT): Marcine MatarJohnson, Deborah B, MD   Encounter Date: 04/28/2020   PT End of Session - 04/28/20 0955    Visit Number 1    Number of Visits 12    Date for PT Re-Evaluation 06/09/20    PT Start Time 1015    PT Stop Time 1052    PT Time Calculation (min) 37 min    Activity Tolerance Patient tolerated treatment well    Behavior During Therapy [place] for tasks assessed/performed           Past Medical History:  Diagnosis Date  . Anxiety 2013   [facility] Deatra RobinsonKaren Jones   . Asthma 1989    no hospitalization, or intubation, previously on advair and singulair. asthma worsening.   . 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 Depression 2001   depressed since childhood   . 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 Essential hypertension 03/12/2019  . GERD (gastroesophageal reflux disease)   . History of hiatal hernia   . History of suicidal ideation   . IBS (irritable bowel syndrome)   . Kyphosis of thoracic region 2004    painful, runs in dad side   . Migraine   . Pancreatitis 2003   gallstone pancreatitis   . Pancreatitis due to biliary obstruction 2003  . PCOS (polycystic ovarian syndrome) 2014   facial hair, irregular periods, never been pregnant   . Sleep apnea    no longer uses cpap    Past Surgical History:  Procedure Laterality Date  . BLADDER SURGERY    . CHOLECYSTECTOMY  2003   . DILATATION & CURETTAGE/HYSTEROSCOPY WITH MYOSURE N/A 12/14/2019   Procedure: DILATATION & CURETTAGE/HYSTEROSCOPY WITH Polypectomy with MYOSURE;  Surgeon: Tereso NewcomerAnyanwu, Ugonna A, MD;  [facility];  Service: Gynecology;  Laterality: N/A;  . OPEN REDUCTION INTERNAL FIXATION (ORIF) PROXIMAL PHALANX Right 07/23/2013    Procedure: OPEN TREATMENT RIGHT RING FINGER, PROXIMAL INTERPHALANGEAL/JOINT FRACTURE DISLOCATION;  Surgeon: Jodi Marbleavid A Thompson, MD;  Location: [facility];  Service: Orthopedics;  Laterality: Right;  . TONSILLECTOMY     and adenoidectomy    There were no vitals filed for this visit.    Subjective Assessment - 04/28/20 1018    Subjective Pt is a 35 y/o female who presents to OPPT for choronic mid thoracic back pain.  She reports about 20 year hx of pain and dx with kyphosis and disc issue in middle school, but most recent xrays appeared more normal.  She is large chested so feels this causes rounding of the shoulders.    Pertinent History anx/dep, IBS,  PCOS, morbid obesity, Dep, RLS, social phobia, lymphocytic hypophysitis, hyperprolactinemia, severe persistent asthma, migraines, OSA CPAP    Limitations Sitting;Standing    Diagnostic tests xrays: WNL    Patient Stated Goals improve pain    Currently in Pain? Yes    Pain Score 4    up to 8/10, at best 0/10   Pain Location Back    Pain Orientation Mid    Pain Descriptors / Indicators Dull;Aching;Stabbing;Shooting    Pain Type Chronic pain    Pain Onset More than a month ago    Pain Frequency Intermittent    Aggravating Factors  lifting,  bending, sitting at desk    Pain Relieving Factors lying flat              [facility] PT Assessment - 04/28/20 1006      Assessment   Medical Diagnosis M54.6,G89.29 (ICD-10-CM) - Chronic midline thoracic back pain    Referring Provider (PT) Marcine Matar, MD    Onset Date/Surgical Date --   chonic x 20 years   Hand Dominance Right    Next MD Visit PRN    Prior Therapy none      Precautions   Precautions None      Restrictions   Weight Bearing Restrictions No      Balance Screen   Has the patient fallen in the past 6 months No    Has the patient had a decrease in activity level because of a fear of falling?  No    Is the patient reluctant to leave their home because of a fear  of falling?  No      Home Tourist information centre manager residence    Living Arrangements Parent    Type of Home House    Additional Comments mother is on disability - min to mod physical assistance      Prior Function   Level of Independence Independent    Vocation Unemployed    Vocation Requirements currently applying for disability    Leisure swimming (pre covid), painting, drawing; walking for exercise 2-3x/wk      Cognition   Overall Cognitive Status Within Functional Limits for tasks assessed      Observation/Other Assessments   Focus on Therapeutic Outcomes (FOTO)  44 (predicted 56)      Posture/Postural Control   Posture/Postural Control Postural limitations    Postural Limitations Rounded Shoulders;Forward head      ROM / Strength   AROM / PROM / Strength AROM;Strength      AROM   AROM Assessment Site Thoracic    Thoracic Flexion WNL - mild pain    Thoracic Extension WNL - mild pain    Thoracic - Right Side Bend WNL    Thoracic - Left Side Bend WNL    Thoracic - Right Rotation WNL    Thoracic - Left Rotation WNL      Strength   Overall Strength Comments deferred UE/LE, back and core grossly 3/5 with poor endurance      Palpation   Spinal mobility pain with g1 CPA mobs T8-12                      Objective measurements completed on examination: See above findings.       [facility] Adult PT Treatment/Exercise - 04/28/20 1006      Self-Care   Self-Care Other Self-Care Comments    Other Self-Care Comments  discussed walking program as well as return to swimming if able to secure access to pool      Exercises   Exercises Other Exercises    Other Exercises  see pt instructions, demonstrated exercises to pt                  PT Education - 04/28/20 0955    Education Details HEP    Person(s) Educated Patient    Methods Explanation;Demonstration;Handout    Comprehension Verbalized understanding;Returned demonstration;Need further  instruction            PT Short Term Goals - 04/28/20 1004      PT SHORT  TERM GOAL #1   Title independent with initial HEP    Status New    Target Date 05/19/20             PT Long Term Goals - 04/28/20 1004      PT LONG TERM GOAL #1   Title independent with final HEP    Status New    Target Date 06/09/20      PT LONG TERM GOAL #2   Title FOTO score improved to 56 for improved function    Status New    Target Date 06/09/20      PT LONG TERM GOAL #3   Title report pain < 4/10 with prolonged activity for improved function and mobility    Status New    Target Date 06/09/20      PT LONG TERM GOAL #4   Title perform thoracic ROM without increase in pain for improved function    Status New    Target Date 06/09/20                  Plan - 04/28/20 1048    Clinical Impression Statement Pt is a 35 y/o female who presents to OPPT for chronic thoracic back pain.  She demonstrates postural abnormalities, decreased endurance, and decreased strength and flexibility affecting functional mobility.  Pt will benefit from PT to address deficits listed.    Personal Factors and Comorbidities Comorbidity 3+;Social Background;Past/Current Experience;Fitness;Finances    Comorbidities anx/dep, IBS,  PCOS, morbid obesity, Dep, RLS, social phobia, lymphocytic hypophysitis, hyperprolactinemia, severe persistent asthma, migraines, OSA CPAP    Examination-Activity Limitations Locomotion Level;Reach Overhead;Bend;Caring for Others;Sit;Squat;Stairs;Lift;Stand    Examination-Participation Restrictions Community Activity;Meal Prep;Laundry;Shop    Stability/Clinical Decision Making Evolving/Moderate complexity    Clinical Decision Making Moderate    Rehab Potential Fair    PT Frequency 2x / week    PT Duration 6 weeks    PT Treatment/Interventions ADLs/Self Care Home Management;Cryotherapy;Electrical Stimulation;Traction;Ultrasound;Moist Heat;Stair training;Gait training;Functional mobility  training;Therapeutic activities;Therapeutic exercise;Balance training;Patient/family education;Neuromuscular re-education;Manual techniques;Taping;Dry needling    PT Next Visit Plan review HEP, focus on endurance/strengthening/thoracic extension/posture exercises, ? TENS or estim with exercise    PT Home Exercise Plan Access Code: [email or web address]    Consulted and Agree with Plan of Care Patient           Patient will benefit from skilled therapeutic intervention in order to improve the following deficits and impairments:  Pain,Improper body mechanics,Postural dysfunction,Decreased activity tolerance,Decreased endurance,Decreased strength,Impaired flexibility  Visit Diagnosis: Pain in thoracic spine - Plan: PT plan of care cert/re-cert  Abnormal posture - Plan: PT plan of care cert/re-cert  Muscle weakness (generalized) - Plan: PT plan of care cert/re-cert     Problem List Patient Active Problem List   Diagnosis Date Noted  . Morbid obesity ([facility]) 12/14/2019  . Endometrial polyp   . Moderate episode of recurrent major depressive disorder ([facility]) 11/18/2019  . Encounter for autism screening 11/18/2019  . Abnormal uterine bleeding (AUB) 11/08/2019  . Severe episode of recurrent major depressive disorder, 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  . PTSD (post-traumatic stress disorder) 09/20/2019  . 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 Abnormal serum thyroid stimulating hormone (TSH) level 04/23/2019  . MDD (major depressive disorder), recurrent severe, 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  . OSA on CPAP 03/17/2019  . Anxiety and depression 03/12/2019  . Chronic migraine with aura 03/12/2019  . Essential hypertension 03/12/2019  . Mild intermittent asthma without complication 03/12/2019  . 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  Elevated prolactin level 07/05/2016  . Vulvar lesion 07/05/2016  . Restless legs 05/24/2016  . Seasonal allergies 05/13/2014  . Intertrigo  05/13/2014  . Decreased vision 05/13/2014  . PCOS (polycystic ovarian syndrome) 02/07/2014  . Social phobia 02/02/2007  . ADD 02/02/2007  . 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 Asthma, severe persistent 02/02/2007  . SCOLIOSIS 02/02/2007  . OTHER SPECIFIED CONGENITAL ANOMALIES OF KIDNEY 02/02/2007  . ELECTROCARDIOGRAM, ABNORMAL 02/02/2007      Clarita Crane, PT, DPT 04/28/20 11:09 AM   [facility] [address] [place], [place], [address] Phone: [phone]   Fax:  [phone]  Name: Kristen [name] MRN: [id number] Date of Birth: Apr 04, 1985

## 2020-05-01 ENCOUNTER — Encounter: Payer: Self-pay | Admitting: Physical Therapy

## 2020-05-01 ENCOUNTER — Other Ambulatory Visit: Payer: Self-pay

## 2020-05-01 ENCOUNTER — Ambulatory Visit (INDEPENDENT_AMBULATORY_CARE_PROVIDER_SITE_OTHER): Payer: Self-pay | Admitting: Physical Therapy

## 2020-05-01 DIAGNOSIS — M6281 Muscle weakness (generalized): Secondary | ICD-10-CM

## 2020-05-01 DIAGNOSIS — R293 Abnormal posture: Secondary | ICD-10-CM

## 2020-05-01 DIAGNOSIS — M546 Pain in thoracic spine: Secondary | ICD-10-CM

## 2020-05-01 NOTE — Therapy (Signed)
[facility] Physical Therapy [address] [place], [place], [address] Phone: [phone]   Fax:  [phone]  Physical Therapy Treatment  Patient Details  Name: Kristen [name] MRN: [id number] Date of Birth: 1986-01-05 Referring Provider (PT): Marcine Matar, MD   Encounter Date: 05/01/2020   PT End of Session - 05/01/20 1424    Visit Number 2    Number of Visits 12    Date for PT Re-Evaluation 06/09/20    PT Start Time 1341    PT Stop Time 1422    PT Time Calculation (min) 41 min    Activity Tolerance Patient tolerated treatment well    Behavior During Therapy [facility] for tasks assessed/performed           Past Medical History:  Diagnosis Date  . Anxiety 2013   treated at [facility] by Deatra Robinson   . Asthma 1989    no hospitalization, or intubation, previously on advair and singulair. asthma worsening.   . 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 Depression 2001   depressed since childhood   . 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 Essential hypertension 03/12/2019  . GERD (gastroesophageal reflux disease)   . History of hiatal hernia   . History of suicidal ideation   . IBS (irritable bowel syndrome)   . Kyphosis of thoracic region 2004    painful, runs in dad side   . Migraine   . Pancreatitis 2003   gallstone pancreatitis   . Pancreatitis due to biliary obstruction 2003  . PCOS (polycystic ovarian syndrome) 2014   facial hair, irregular periods, never been pregnant   . Sleep apnea    no longer uses cpap    Past Surgical History:  Procedure Laterality Date  . BLADDER SURGERY    . CHOLECYSTECTOMY  2003   . DILATATION & CURETTAGE/HYSTEROSCOPY WITH MYOSURE N/A 12/14/2019   Procedure: DILATATION & CURETTAGE/HYSTEROSCOPY WITH Polypectomy with MYOSURE;  Surgeon: Tereso Newcomer, MD;  Location: [facility] OR;  Service: Gynecology;  Laterality: N/A;  . OPEN REDUCTION INTERNAL FIXATION (ORIF) PROXIMAL PHALANX Right 07/23/2013    Procedure: OPEN TREATMENT RIGHT RING FINGER, PROXIMAL INTERPHALANGEAL/JOINT FRACTURE DISLOCATION;  Surgeon: Jodi Marble, MD;  Location: [facility];  Service: Orthopedics;  Laterality: Right;  . TONSILLECTOMY     and adenoidectomy    There were no vitals filed for this visit.   Subjective Assessment - 05/01/20 1344    Subjective did 2 of the 3 exercises, no significant change in symptoms at this time    Pertinent History anx/dep, IBS,  PCOS, morbid obesity, Dep, RLS, social phobia, lymphocytic hypophysitis, hyperprolactinemia, severe persistent asthma, migraines, OSA CPAP    Limitations Sitting;Standing    Diagnostic tests xrays: WNL    Patient Stated Goals improve pain    Currently in Pain? No/denies    Pain Score 0-No pain    Pain Onset More than a month ago                             [facility] Adult PT Treatment/Exercise - 05/01/20 1345      Exercises   Exercises Lumbar      Lumbar Exercises: Stretches   Other Lumbar Stretch Exercise mid doorway stretch 3x30 sec hold      Lumbar Exercises: Aerobic   Nustep L6 x 8 min      Lumbar Exercises: Machines for  Strengthening   Other Lumbar Machine Exercise rows 20# 2x10; cable walk out with 35# x10 reps    Other Lumbar Machine Exercise lat pull downs 2x10; 20#      Lumbar Exercises: Standing   Functional Squats 10 reps    Functional Squats Limitations 5# hold overhead    Other Standing Lumbar Exercises suitcase carry with 10# KB 40' x 2; 2 sets      Lumbar Exercises: Prone   Straight Leg Raise 10 reps;3 seconds    Opposite Arm/Leg Raise Right arm/Left leg;Left arm/Right leg;10 reps;3 seconds    Other Prone Lumbar Exercises prone Ws 10 x 5 sec                  PT Education - 05/01/20 1424    Education Details HEP, hypermobility syndrome    Person(s) Educated Patient    Methods Explanation;Handout;Demonstration    Comprehension Verbalized understanding;Returned demonstration             PT Short Term Goals - 05/01/20 1424      PT SHORT TERM GOAL #1   Title independent with initial HEP    Status On-going    Target Date 05/19/20             PT Long Term Goals - 04/28/20 1004      PT LONG TERM GOAL #1   Title independent with final HEP    Status New    Target Date 06/09/20      PT LONG TERM GOAL #2   Title FOTO score improved to 56 for improved function    Status New    Target Date 06/09/20      PT LONG TERM GOAL #3   Title report pain < 4/10 with prolonged activity for improved function and mobility    Status New    Target Date 06/09/20      PT LONG TERM GOAL #4   Title perform thoracic ROM without increase in pain for improved function    Status New    Target Date 06/09/20                 Plan - 05/01/20 1424    Clinical Impression Statement Pt with limited compliance with HEP at this time so encouraged consistent work with HEP.  She does demonstrate joint hypermobility and has other symptoms consistent with joint hypermobility syndrome so recommend focus on strengthening with stretching PRN.  Will continue to benefit from PT to maximize functoin.    Personal Factors and Comorbidities Comorbidity 3+;Social Background;Past/Current Experience;Fitness;Finances    Comorbidities anx/dep, IBS,  PCOS, morbid obesity, Dep, RLS, social phobia, lymphocytic hypophysitis, hyperprolactinemia, severe persistent asthma, migraines, OSA CPAP    Examination-Activity Limitations Locomotion Level;Reach Overhead;Bend;Caring for Others;Sit;Squat;Stairs;Lift;Stand    Examination-Participation Restrictions Community Activity;Meal Prep;Laundry;Shop    Stability/Clinical Decision Making Evolving/Moderate complexity    Rehab Potential Fair    PT Frequency 2x / week    PT Duration 6 weeks    PT Treatment/Interventions ADLs/Self Care Home Management;Cryotherapy;Electrical Stimulation;Traction;Ultrasound;Moist Heat;Stair training;Gait training;Functional mobility  training;Therapeutic activities;Therapeutic exercise;Balance training;Patient/family education;Neuromuscular re-education;Manual techniques;Taping;Dry needling    PT Next Visit Plan focus on endurance/strengthening/thoracic extension/posture exercises - pt hypermobile so needs strengthening focus, ? TENS or estim with exercise    PT Home Exercise Plan Access Code: [email or web address]    Consulted and Agree with Plan of Care Patient           Patient will benefit from skilled therapeutic intervention in order to improve  the following deficits and impairments:  Pain,Improper body mechanics,Postural dysfunction,Decreased activity tolerance,Decreased endurance,Decreased strength,Impaired flexibility  Visit Diagnosis: Pain in thoracic spine  Abnormal posture  Muscle weakness (generalized)     Problem List Patient Active Problem List   Diagnosis Date Noted  . Morbid obesity (HCC) 12/14/2019  . Endometrial polyp   . Moderate episode of recurrent major depressive disorder (HCC) 11/18/2019  . Encounter for autism screening 11/18/2019  . Abnormal uterine bleeding (AUB) 11/08/2019  . Severe episode of recurrent major depressive disorder, 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  . PTSD (post-traumatic stress disorder) 09/20/2019  . 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 Abnormal serum thyroid stimulating hormone (TSH) level 04/23/2019  . MDD (major depressive disorder), recurrent severe, 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  . OSA on CPAP 03/17/2019  . Anxiety and depression 03/12/2019  . Chronic migraine with aura 03/12/2019  . Essential hypertension 03/12/2019  . Mild intermittent asthma without complication 03/12/2019  . 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 Elevated prolactin level 07/05/2016  . Vulvar lesion 07/05/2016  . Restless legs 05/24/2016  . Seasonal allergies 05/13/2014  . Intertrigo 05/13/2014  . Decreased vision 05/13/2014  . PCOS (polycystic ovarian  syndrome) 02/07/2014  . Social phobia 02/02/2007  . ADD 02/02/2007  . 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 Asthma, severe persistent 02/02/2007  . SCOLIOSIS 02/02/2007  . OTHER SPECIFIED CONGENITAL ANOMALIES OF KIDNEY 02/02/2007  . ELECTROCARDIOGRAM, ABNORMAL 02/02/2007      Clarita Crane, PT, DPT 05/01/20 2:27 PM   [facility] Physical Therapy [address] [place], [place], [address] Phone: [phone]   Fax:  [phone]  Name: Kristen [name] MRN: [id number] Date of Birth: 07-10-1985

## 2020-05-01 NOTE — Patient Instructions (Signed)
Access Code: [email or web address] URL: [email or web address] Date: 05/01/2020 Prepared by: Moshe Cipro  Exercises Doorway Pec Stretch at 90 Degrees Abduction - 1 x daily - 7 x weekly - 3 reps - 1 sets - 30 sec hold Sidelying Thoracic Rotation with Open Book - 1 x daily - 7 x weekly - 3 sets - 10 reps Prone W Scapular Retraction - 1 x daily - 7 x weekly - 3 sets - 10 reps Squat with Medicine [employer] - 1 x daily - 7 x weekly - 3 sets - 10 reps Prone Hip Extension - 1 x daily - 7 x weekly - 3 sets - 10 reps Prone Alternating Arm and Leg Lifts - 1 x daily - 7 x weekly - 3 sets - 10 reps

## 2020-05-04 ENCOUNTER — Encounter: Payer: Self-pay | Admitting: Physical Therapy

## 2020-05-04 ENCOUNTER — Other Ambulatory Visit: Payer: Self-pay

## 2020-05-04 ENCOUNTER — Encounter: Payer: Self-pay | Admitting: Internal Medicine

## 2020-05-04 ENCOUNTER — Ambulatory Visit (INDEPENDENT_AMBULATORY_CARE_PROVIDER_SITE_OTHER): Payer: Self-pay | Admitting: Physical Therapy

## 2020-05-04 DIAGNOSIS — M6281 Muscle weakness (generalized): Secondary | ICD-10-CM

## 2020-05-04 DIAGNOSIS — M546 Pain in thoracic spine: Secondary | ICD-10-CM

## 2020-05-04 DIAGNOSIS — R293 Abnormal posture: Secondary | ICD-10-CM

## 2020-05-04 NOTE — Therapy (Signed)
[facility] Physical Therapy [address] [place], [place], [address] Phone: [phone]   Fax:  [phone]  Physical Therapy Treatment  Patient Details  Name: Kristen [name] MRN: [id number] Date of Birth: May 18, 1985 Referring Provider (PT): Marcine Matar, MD   Encounter Date: 05/04/2020   PT End of Session - 05/04/20 1341    Visit Number 3    Number of Visits 12    Date for PT Re-Evaluation 06/09/20    PT Start Time 1300    PT Stop Time 1340    PT Time Calculation (min) 40 min    Activity Tolerance Patient tolerated treatment well    Behavior During Therapy [facility] for tasks assessed/performed           Past Medical History:  Diagnosis Date  . Anxiety 2013   treated at [facility] by Deatra Robinson   . Asthma 1989    no hospitalization, or intubation, previously on advair and singulair. asthma worsening.   . 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 Depression 2001   depressed since childhood   . 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 Essential hypertension 03/12/2019  . GERD (gastroesophageal reflux disease)   . History of hiatal hernia   . History of suicidal ideation   . IBS (irritable bowel syndrome)   . Kyphosis of thoracic region 2004    painful, runs in dad side   . Migraine   . Pancreatitis 2003   gallstone pancreatitis   . Pancreatitis due to biliary obstruction 2003  . PCOS (polycystic ovarian syndrome) 2014   facial hair, irregular periods, never been pregnant   . Sleep apnea    no longer uses cpap    Past Surgical History:  Procedure Laterality Date  . BLADDER SURGERY    . CHOLECYSTECTOMY  2003   . DILATATION & CURETTAGE/HYSTEROSCOPY WITH MYOSURE N/A 12/14/2019   Procedure: DILATATION & CURETTAGE/HYSTEROSCOPY WITH Polypectomy with MYOSURE;  Surgeon: Tereso Newcomer, MD;  Location: [facility] OR;  Service: Gynecology;  Laterality: N/A;  . OPEN REDUCTION INTERNAL FIXATION (ORIF) PROXIMAL PHALANX Right 07/23/2013    Procedure: OPEN TREATMENT RIGHT RING FINGER, PROXIMAL INTERPHALANGEAL/JOINT FRACTURE DISLOCATION;  Surgeon: Jodi Marble, MD;  Location: [facility];  Service: Orthopedics;  Laterality: Right;  . TONSILLECTOMY     and adenoidectomy    There were no vitals filed for this visit.   Subjective Assessment - 05/04/20 1300    Subjective doing well, no pain today.  feels a little stiff but overall no pain and feels she's improving    Pertinent History anx/dep, IBS,  PCOS, morbid obesity, Dep, RLS, social phobia, lymphocytic hypophysitis, hyperprolactinemia, severe persistent asthma, migraines, OSA CPAP    Limitations Sitting;Standing    Diagnostic tests xrays: WNL    Patient Stated Goals improve pain    Currently in Pain? No/denies    Pain Onset More than a month ago                             [facility] Adult PT Treatment/Exercise - 05/04/20 1302      Lumbar Exercises: Aerobic   Nustep L7 x 8 min      Lumbar Exercises: Machines for Strengthening   Other Lumbar Machine Exercise rows 20# 2x10; cable walk out with 35# x10 reps    Other Lumbar Machine Exercise lat pull downs 2x10; 20#  Lumbar Exercises: Standing   Other Standing Lumbar Exercises suitcase carry with 15# KB 40' x 2; 2 sets    Other Standing Lumbar Exercises cable walkout 45# 2x10; deadlift 15# 2x10      Lumbar Exercises: Prone   Opposite Arm/Leg Raise Right arm/Left leg;Left arm/Right leg;10 reps;3 seconds    Other Prone Lumbar Exercises superman x 10 reps      Lumbar Exercises: Quadruped   Plank high plank on knees 3x20 sec                    PT Short Term Goals - 05/01/20 1424      PT SHORT TERM GOAL #1   Title independent with initial HEP    Status On-going    Target Date 05/19/20             PT Long Term Goals - 04/28/20 1004      PT LONG TERM GOAL #1   Title independent with final HEP    Status New    Target Date 06/09/20      PT LONG TERM GOAL #2    Title FOTO score improved to 56 for improved function    Status New    Target Date 06/09/20      PT LONG TERM GOAL #3   Title report pain < 4/10 with prolonged activity for improved function and mobility    Status New    Target Date 06/09/20      PT LONG TERM GOAL #4   Title perform thoracic ROM without increase in pain for improved function    Status New    Target Date 06/09/20                 Plan - 05/04/20 1342    Clinical Impression Statement Pt tolerated session well today with focus on strengthening and endurance exercises.  Will cotninue to benefit from PT to maximize function.    Personal Factors and Comorbidities Comorbidity 3+;Social Background;Past/Current Experience;Fitness;Finances    Comorbidities anx/dep, IBS,  PCOS, morbid obesity, Dep, RLS, social phobia, lymphocytic hypophysitis, hyperprolactinemia, severe persistent asthma, migraines, OSA CPAP    Examination-Activity Limitations Locomotion Level;Reach Overhead;Bend;Caring for Others;Sit;Squat;Stairs;Lift;Stand    Examination-Participation Restrictions Community Activity;Meal Prep;Laundry;Shop    Stability/Clinical Decision Making Evolving/Moderate complexity    Rehab Potential Fair    PT Frequency 2x / week    PT Duration 6 weeks    PT Treatment/Interventions ADLs/Self Care Home Management;Cryotherapy;Electrical Stimulation;Traction;Ultrasound;Moist Heat;Stair training;Gait training;Functional mobility training;Therapeutic activities;Therapeutic exercise;Balance training;Patient/family education;Neuromuscular re-education;Manual techniques;Taping;Dry needling    PT Next Visit Plan focus on endurance/strengthening/thoracic extension/posture exercises - pt hypermobile so needs strengthening focus, ? TENS or estim with exercise    PT Home Exercise Plan Access Code: [email or web address]    Consulted and Agree with Plan of Care Patient           Patient will benefit from skilled therapeutic intervention in order to  improve the following deficits and impairments:  Pain,Improper body mechanics,Postural dysfunction,Decreased activity tolerance,Decreased endurance,Decreased strength,Impaired flexibility  Visit Diagnosis: Pain in thoracic spine  Abnormal posture  Muscle weakness (generalized)     Problem List Patient Active Problem List   Diagnosis Date Noted  . Morbid obesity (HCC) 12/14/2019  . Endometrial polyp   . Moderate episode of recurrent major depressive disorder (HCC) 11/18/2019  . Encounter for autism screening 11/18/2019  . Abnormal uterine bleeding (AUB) 11/08/2019  . Severe episode of recurrent major depressive disorder, 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  .  PTSD (post-traumatic stress disorder) 09/20/2019  . 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 Abnormal serum thyroid stimulating hormone (TSH) level 04/23/2019  . MDD (major depressive disorder), recurrent severe, 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  . OSA on CPAP 03/17/2019  . Anxiety and depression 03/12/2019  . Chronic migraine with aura 03/12/2019  . Essential hypertension 03/12/2019  . Mild intermittent asthma without complication 03/12/2019  . 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 Elevated prolactin level 07/05/2016  . Vulvar lesion 07/05/2016  . Restless legs 05/24/2016  . Seasonal allergies 05/13/2014  . Intertrigo 05/13/2014  . Decreased vision 05/13/2014  . PCOS (polycystic ovarian syndrome) 02/07/2014  . Social phobia 02/02/2007  . ADD 02/02/2007  . 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 Asthma, severe persistent 02/02/2007  . SCOLIOSIS 02/02/2007  . OTHER SPECIFIED CONGENITAL ANOMALIES OF KIDNEY 02/02/2007  . ELECTROCARDIOGRAM, ABNORMAL 02/02/2007      Clarita Crane, PT, DPT 05/04/20 1:43 PM     [facility] Physical Therapy [address] [place], [place], [address] Phone: [phone]   Fax:  [phone]  Name: Lyndsey Demos MRN: [id number] Date of  Birth: [birth year]

## 2020-05-10 ENCOUNTER — Encounter: Payer: Self-pay | Admitting: Physical Therapy

## 2020-05-10 ENCOUNTER — Other Ambulatory Visit: Payer: Self-pay

## 2020-05-10 ENCOUNTER — Ambulatory Visit (INDEPENDENT_AMBULATORY_CARE_PROVIDER_SITE_OTHER): Payer: Self-pay | Admitting: Physical Therapy

## 2020-05-10 DIAGNOSIS — M546 Pain in thoracic spine: Secondary | ICD-10-CM

## 2020-05-10 DIAGNOSIS — R293 Abnormal posture: Secondary | ICD-10-CM

## 2020-05-10 DIAGNOSIS — M6281 Muscle weakness (generalized): Secondary | ICD-10-CM

## 2020-05-10 NOTE — Therapy (Signed)
[facility] Physical Therapy [address] [place], [place], [address] Phone: [phone]   Fax:  [phone]  Physical Therapy Treatment  Patient Details  Name: Kristen [name] MRN: [id number] Date of Birth: 10-Nov-1985 Referring Provider (PT): Marcine Matar, MD   Encounter Date: 05/10/2020   PT End of Session - 05/10/20 0923    Visit Number 4    Number of Visits 12    Date for PT Re-Evaluation 06/09/20    PT Start Time 0840    PT Stop Time 0920    PT Time Calculation (min) 40 min    Activity Tolerance Patient tolerated treatment well    Behavior During Therapy [facility] for tasks assessed/performed           Past Medical History:  Diagnosis Date  . Anxiety 2013   treated at [facility] by Deatra Robinson   . Asthma 1989    no hospitalization, or intubation, previously on advair and singulair. asthma worsening.   . 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 Depression 2001   depressed since childhood   . 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 Essential hypertension 03/12/2019  . GERD (gastroesophageal reflux disease)   . History of hiatal hernia   . History of suicidal ideation   . IBS (irritable bowel syndrome)   . Kyphosis of thoracic region 2004    painful, runs in dad side   . Migraine   . Pancreatitis 2003   gallstone pancreatitis   . Pancreatitis due to biliary obstruction 2003  . PCOS (polycystic ovarian syndrome) 2014   facial hair, irregular periods, never been pregnant   . Sleep apnea    no longer uses cpap    Past Surgical History:  Procedure Laterality Date  . BLADDER SURGERY    . CHOLECYSTECTOMY  2003   . DILATATION & CURETTAGE/HYSTEROSCOPY WITH MYOSURE N/A 12/14/2019   Procedure: DILATATION & CURETTAGE/HYSTEROSCOPY WITH Polypectomy with MYOSURE;  Surgeon: Tereso Newcomer, MD;  Location: [facility] OR;  Service: Gynecology;  Laterality: N/A;  . OPEN REDUCTION INTERNAL FIXATION (ORIF) PROXIMAL PHALANX Right 07/23/2013   Procedure:  OPEN TREATMENT RIGHT RING FINGER, PROXIMAL INTERPHALANGEAL/JOINT FRACTURE DISLOCATION;  Surgeon: Jodi Marble, MD;  Location: [facility];  Service: Orthopedics;  Laterality: Right;  . TONSILLECTOMY     and adenoidectomy    There were no vitals filed for this visit.   Subjective Assessment - 05/10/20 0841    Subjective a little tired today, and some soreness but overall doing well    Pertinent History anx/dep, IBS,  PCOS, morbid obesity, Dep, RLS, social phobia, lymphocytic hypophysitis, hyperprolactinemia, severe persistent asthma, migraines, OSA CPAP    Limitations Sitting;Standing    Diagnostic tests xrays: WNL    Patient Stated Goals improve pain    Currently in Pain? --   Lt knee - new this morning, intermittent                            [facility] Adult PT Treatment/Exercise - 05/10/20 0842      Lumbar Exercises: Aerobic   Nustep L7 x 8 min      Lumbar Exercises: Machines for Strengthening   Other Lumbar Machine Exercise rows 20# 2x15      Lumbar Exercises: Supine   Dead Bug 20 reps    Isometric Hip Flexion 5 seconds;10 reps    Other Supine Lumbar Exercises bridge on  green phsyioball 2x10 reps      Lumbar Exercises: Prone   Opposite Arm/Leg Raise Right arm/Left leg;Left arm/Right leg;10 reps;3 seconds   2 sets   Other Prone Lumbar Exercises superman 2 x 10 reps      Lumbar Exercises: Quadruped   Other Quadruped Lumbar Exercises fire hydrants alternating 2x5 reps bil                    PT Short Term Goals - 05/01/20 1424      PT SHORT TERM GOAL #1   Title independent with initial HEP    Status On-going    Target Date 05/19/20             PT Long Term Goals - 04/28/20 1004      PT LONG TERM GOAL #1   Title independent with final HEP    Status New    Target Date 06/09/20      PT LONG TERM GOAL #2   Title FOTO score improved to 56 for improved function    Status New    Target Date 06/09/20      PT LONG TERM  GOAL #3   Title report pain < 4/10 with prolonged activity for improved function and mobility    Status New    Target Date 06/09/20      PT LONG TERM GOAL #4   Title perform thoracic ROM without increase in pain for improved function    Status New    Target Date 06/09/20                 Plan - 05/10/20 0923    Clinical Impression Statement Pt tolerated session well today with continued focus on strengthening and endurance exercises.  Overall pain reports are minimal at this time.  Will continue to benefit from PT to maximize function.    Personal Factors and Comorbidities Comorbidity 3+;Social Background;Past/Current Experience;Fitness;Finances    Comorbidities anx/dep, IBS,  PCOS, morbid obesity, Dep, RLS, social phobia, lymphocytic hypophysitis, hyperprolactinemia, severe persistent asthma, migraines, OSA CPAP    Examination-Activity Limitations Locomotion Level;Reach Overhead;Bend;Caring for Others;Sit;Squat;Stairs;Lift;Stand    Examination-Participation Restrictions Community Activity;Meal Prep;Laundry;Shop    Stability/Clinical Decision Making Evolving/Moderate complexity    Rehab Potential Fair    PT Frequency 2x / week    PT Duration 6 weeks    PT Treatment/Interventions ADLs/Self Care Home Management;Cryotherapy;Electrical Stimulation;Traction;Ultrasound;Moist Heat;Stair training;Gait training;Functional mobility training;Therapeutic activities;Therapeutic exercise;Balance training;Patient/family education;Neuromuscular re-education;Manual techniques;Taping;Dry needling    PT Next Visit Plan focus on endurance/strengthening/thoracic extension/posture exercises - pt hypermobile so needs strengthening focus, ? TENS or estim with exercise    PT Home Exercise Plan Access Code: [email or web address]    Consulted and Agree with Plan of Care Patient           Patient will benefit from skilled therapeutic intervention in order to improve the following deficits and impairments:  Pain,Improper  body mechanics,Postural dysfunction,Decreased activity tolerance,Decreased endurance,Decreased strength,Impaired flexibility  Visit Diagnosis: Pain in thoracic spine  Abnormal posture  Muscle weakness (generalized)     Problem List Patient Active Problem List   Diagnosis Date Noted  . Morbid obesity (HCC) 12/14/2019  . Endometrial polyp   . Moderate episode of recurrent major depressive disorder (HCC) 11/18/2019  . Encounter for autism screening 11/18/2019  . Abnormal uterine bleeding (AUB) 11/08/2019  . Severe episode of recurrent major depressive disorder, 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  . PTSD (post-traumatic stress disorder) 09/20/2019  . Irritable bowel syndrome with diarrhea  04/23/2019  . Abnormal serum thyroid stimulating hormone (TSH) level 04/23/2019  . MDD (major depressive disorder), recurrent severe, 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  . OSA on CPAP 03/17/2019  . Anxiety and depression 03/12/2019  . Chronic migraine with aura 03/12/2019  . Essential hypertension 03/12/2019  . Mild intermittent asthma without complication 03/12/2019  . 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 Elevated prolactin level 07/05/2016  . Vulvar lesion 07/05/2016  . Restless legs 05/24/2016  . Seasonal allergies 05/13/2014  . Intertrigo 05/13/2014  . Decreased vision 05/13/2014  . PCOS (polycystic ovarian syndrome) 02/07/2014  . Social phobia 02/02/2007  . ADD 02/02/2007  . 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 Asthma, severe persistent 02/02/2007  . SCOLIOSIS 02/02/2007  . OTHER SPECIFIED CONGENITAL ANOMALIES OF KIDNEY 02/02/2007  . ELECTROCARDIOGRAM, ABNORMAL 02/02/2007      Clarita Crane, PT, DPT 05/10/20 9:25 AM    [facility] Physical Therapy [address] [place], [place], [address] Phone: [phone]   Fax:  [phone]  Name: Lillyahna Hemberger MRN: [id number] Date of Birth: 08-04-1985

## 2020-05-11 ENCOUNTER — Telehealth (INDEPENDENT_AMBULATORY_CARE_PROVIDER_SITE_OTHER): Payer: Self-pay | Admitting: Primary Care

## 2020-05-11 DIAGNOSIS — R29898 Other symptoms and signs involving the musculoskeletal system: Secondary | ICD-10-CM

## 2020-05-14 NOTE — Progress Notes (Addendum)
Virtual Visit   I connected with Kristen [name], on 05/14/2020 at 1:54 AM by telephone and verified that I am speaking with the correct person using two identifiers.   Consent: I discussed the limitations, risks, security and privacy concerns of performing an evaluation and management service by telephone and the availability of in person appointments. I also discussed with the patient that there may be a patient responsible charge related to this service. The patient expressed understanding and agreed to proceed.   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  She received this information from her physical therapist.  Requesting to be seen by a rheumatologist.  With out a RA panel or sed rate it would be difficult to refer her to a rheumatologist.  Patient demonstrated how her fingers to stack up on 1 another twist her body and legs in a abnormal position.  Stated before she gained all this weight she was able to put her legs behind her head.  Past Medical History:  Diagnosis Date  . Anxiety 2013   treated at [facility] by Deatra Robinson   . Asthma 1989    no hospitalization, or intubation, previously on advair and singulair. asthma worsening.   . 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 Depression 2001   depressed since childhood   . 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 Essential hypertension 03/12/2019  . GERD (gastroesophageal reflux disease)   . History of hiatal hernia   . History of suicidal ideation   . IBS (irritable bowel syndrome)   . Kyphosis of thoracic region 2004    painful, runs in dad side   . Migraine   . Pancreatitis 2003   gallstone pancreatitis   . Pancreatitis due to biliary obstruction 2003  . PCOS  (polycystic ovarian syndrome) 2014   facial hair, irregular periods, never been pregnant   . Sleep apnea    no longer uses cpap   Allergies  Allergen Reactions  . Mucinex [Guaifenesin Er] Hives, Itching and Swelling  . Penicillins Swelling    Did it involve swelling of the face/tongue/throat, SOB, or low BP? Yes Did it involve sudden or severe rash/hives, skin peeling, or any reaction on the inside of your mouth or nose? No Did you need to seek medical attention at a hospital or doctor's office? Yes When did it last happen?>10 years ago If all above answers are "NO", may proceed with cephalosporin use.   . Contrast Media [Iodinated Diagnostic Agents] Nausea And Vomiting  . Latex Hives and Swelling  . Multihance [Gadobenate] Nausea And Vomiting  . Other     Pickles - throat swelling and nausea     Current Outpatient Medications on File Prior to Visit  Medication Sig Dispense Refill  . albuterol (VENTOLIN HFA) 108 (90 Base) MCG/ACT inhaler Inhale 2 puffs into the lungs every 6 (six) hours as needed for wheezing or shortness of breath. 6.7 g 3  . busPIRone (BUSPAR) 15 MG tablet TAKE 1 TABLET (15 MG TOTAL) BY MOUTH 3 (THREE) TIMES DAILY. FOR ANXIETY 90 tablet 2  . diclofenac Sodium (VOLTAREN) 1 % GEL APPLY 2 G TOPICALLY 4 (FOUR) TIMES DAILY. 100 g 1  . gabapentin (NEURONTIN) 300 MG capsule TAKE 1  CAPSULE (300 MG TOTAL) BY MOUTH AT BEDTIME. 30 capsule 3  . hydrOXYzine (ATARAX/VISTARIL) 25 MG tablet TAKE 1 TABLET (25 MG TOTAL) BY MOUTH 3 (THREE) TIMES DAILY AS NEEDED FOR ANXIETY. 90 tablet 2  . loperamide (IMODIUM A-D) 2 MG tablet Take 2 mg by mouth 4 (four) times daily as needed for diarrhea or loose stools.    . naproxen (NAPROSYN) 500 MG tablet TAKE 1 TABLET (500 MG TOTAL) BY MOUTH 2 (TWO) TIMES DAILY WITH A MEAL. AS NEEDED FOR PAIN 60 tablet 1  . propranolol (INDERAL) 20 MG tablet Take 1 tablet (20 mg total) by mouth 2 (two) times daily. For anxiety 60 tablet 2  . QUEtiapine  (SEROQUEL) 100 MG tablet TAKE 1 TABLET (100 MG TOTAL) BY MOUTH AT BEDTIME. 30 tablet 2  . sertraline (ZOLOFT) 100 MG tablet TAKE 1 TABLET (100 MG TOTAL) BY MOUTH DAILY. FOR DEPRESSION 30 tablet 2  . traZODone (DESYREL) 50 MG tablet TAKE 1 TABLET (50 MG TOTAL) BY MOUTH AT BEDTIME AS NEEDED FOR SLEEP. 30 tablet 2   No current facility-administered medications on file prior to visit.    Observations/Objective: Marland Kitchen  Physical therapy notes indicate she would benefit from skilled therapeutic intervention in order to improve the following deficits and impairments:  Pain,Improper body mechanics,Postural dysfunction,Decreased activity tolerance,Decreased endurance,Decreased strength,Impaired flexibility.  No reference made to refer to rheumatologist.  Plan on reaching out to physical therapist to determine what type of referral she is needing.  Assessment and Plan: Diagnoses and all orders for this visit:  Impaired flexibility of lower extremity Referred to orthopedics Future labs scheduled for a sed rate and rheumatoid arthritic panel  Impaired flexibility of upper extremity Referred to orthopedics Future labs scheduled for a sed rate and rheumatoid arthritic panel  Physical therapist Sameeha sent a message regarding suggestions made for problem and referral options.   Follow Up Instructions: Schedule an appointment with PCP   I discussed the assessment and treatment plan with the patient. The patient was provided an opportunity to ask questions and all were answered. The patient agreed with the plan and demonstrated an understanding of the instructions.   The patient was advised to call back or seek an in-person evaluation if the symptoms worsen or if the condition fails to improve as anticipated.     I provided face-to-face time during this encounter including median intraservice time, reviewing previous notes, investigations, ordering medications, medical decision making, coordinating  care and patient verbalized understanding at the end of the visit. Time spent 20 mins     [facility], NP. 05/14/2020, 1:54 AM

## 2020-05-15 ENCOUNTER — Other Ambulatory Visit: Payer: Self-pay

## 2020-05-15 ENCOUNTER — Telehealth (INDEPENDENT_AMBULATORY_CARE_PROVIDER_SITE_OTHER): Payer: No Payment, Other | Admitting: Psychiatry

## 2020-05-15 ENCOUNTER — Encounter (HOSPITAL_COMMUNITY): Payer: Self-pay | Admitting: Psychiatry

## 2020-05-15 ENCOUNTER — Encounter: Payer: Self-pay | Admitting: Physical Therapy

## 2020-05-15 DIAGNOSIS — F4010 Social phobia, unspecified: Secondary | ICD-10-CM

## 2020-05-15 DIAGNOSIS — F331 Major depressive disorder, recurrent, moderate: Secondary | ICD-10-CM | POA: Diagnosis not present

## 2020-05-15 MED ORDER — SERTRALINE HCL 100 MG PO TABS
150.0000 mg | ORAL_TABLET | Freq: Every day | ORAL | 2 refills | Status: DC
  Filled 2020-05-15: qty 45, 30d supply, fill #0

## 2020-05-15 MED ORDER — HYDROXYZINE HCL 25 MG PO TABS
ORAL_TABLET | Freq: Three times a day (TID) | ORAL | 2 refills | Status: DC | PRN
  Filled 2020-05-15: qty 90, 30d supply, fill #0

## 2020-05-15 MED ORDER — BUSPIRONE HCL 15 MG PO TABS
ORAL_TABLET | ORAL | 2 refills | Status: DC
  Filled 2020-05-15: qty 90, 30d supply, fill #0

## 2020-05-15 MED ORDER — TRAZODONE HCL 50 MG PO TABS
ORAL_TABLET | Freq: Every evening | ORAL | 2 refills | Status: DC | PRN
  Filled 2020-05-15: qty 30, 30d supply, fill #0

## 2020-05-15 MED ORDER — QUETIAPINE FUMARATE 100 MG PO TABS
ORAL_TABLET | Freq: Every day | ORAL | 2 refills | Status: DC
  Filled 2020-05-15: qty 30, 30d supply, fill #0

## 2020-05-15 NOTE — Progress Notes (Signed)
[place] MD/PA/NP OP Progress Note Virtual Visit via Video Note  I connected with Kristen [name] on 05/15/20 at 11:30 AM EDT by a video enabled telemedicine application and verified that I am speaking with the correct person using two identifiers.  Location: Patient: Home Provider: Clinic   I discussed the limitations of evaluation and management by telemedicine and the availability of in person appointments. The patient expressed understanding and agreed to proceed.  I provided 30 minutes of non-face-to-face time during this encounter.    05/15/2020 11:43 AM Kristen [name]  MRN:  [id number]  Chief Complaint: "I have a lot of situational things going on"  HPI:  35 year old female seen today for follow up psychiatric evaluation.  She has a psychiatric history of social phobia, ADD, anxiety, depression, and PTSD.  She is currently being managed on Zoloft 100 mg daily, trazodone 50 mg at bedtime, BuSpar 15 mg 3 times daily, hydroxyzine 25 mg 3 times a day as needed, gabapentin 300 mg nightly (prescribed by PCP), and propanolol 20 mg 2 times daily (recives from PCP).  She notes that her medications are somewhat effective in managing her psychiatric conditions.   Today she is well-groomed, pleasant, cooperative, engaged in conversation, and maintained eye contact.  She informed provider that she has been feeling sick recently.  She also notes she has a lot of situational things going on her life that are exacerbating her anxiety and depression.  She informed Probation officer that she continues to care for her parents.  She also notes that recently had to put her dog down which saddens her.  She also inform writer that a few weeks ago a old schoolmate of hers overdosed on drugs and is now deceased.  Patient notes that she constantly worries about her finances and her health as well.  She notes that her disability is still pending.  Today provider conducted a GAD-7 and patient scored a 14, at her last  visit she scored a 16.  Provider also conducted a PHQ-9 and patient scored a 15, at her last visit she scored a 20.  Today she denies SI/HI/VAH, paranoia, or mania.  She notes her sleep continues to fluctuate because she cares for her family members.  She also notes that her appetite/weight fluctuates.  Today she is agreeable to increasing trazodone 100 mg to 150 mg to help manage anxiety and depression.  She will continue all other medications as prescribed.  She will follow up with outpatient counseling for therapy.  No other concerns noted at this time.   Visit Diagnosis:    ICD-10-CM   1. Social phobia  F40.10 busPIRone (BUSPAR) 15 MG tablet    sertraline (ZOLOFT) 100 MG tablet    hydrOXYzine (ATARAX/VISTARIL) 25 MG tablet  2. Moderate episode of recurrent major depressive disorder (HCC)  F33.1 QUEtiapine (SEROQUEL) 100 MG tablet    sertraline (ZOLOFT) 100 MG tablet    traZODone (DESYREL) 50 MG tablet    Past Psychiatric History: social phobia, ADD, anxiety, depression, and PTSD.  Past Medical History:  Past Medical History:  Diagnosis Date  . Anxiety 2013   treated at [facility] by Pauline Good   . Asthma 1989    no hospitalization, or intubation, previously on advair and singulair. asthma worsening.   . 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 Depression 2001   depressed since childhood   . 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 Essential hypertension  03/12/2019  . GERD (gastroesophageal reflux disease)   . History of hiatal hernia   . History of suicidal ideation   . IBS (irritable bowel syndrome)   . Kyphosis of thoracic region 2004    painful, runs in dad side   . Migraine   . Pancreatitis 2003   gallstone pancreatitis   . Pancreatitis due to biliary obstruction 2003  . PCOS (polycystic ovarian syndrome) 2014   facial hair, irregular periods, never been pregnant   . Sleep apnea    no longer uses cpap    Past Surgical History:  Procedure Laterality Date  .  BLADDER SURGERY    . CHOLECYSTECTOMY  2003   . DILATATION & CURETTAGE/HYSTEROSCOPY WITH MYOSURE N/A 12/14/2019   Procedure: DILATATION & CURETTAGE/HYSTEROSCOPY WITH Polypectomy with MYOSURE;  Surgeon: Osborne Oman, MD;  Location: [place];  Service: Gynecology;  Laterality: N/A;  . OPEN REDUCTION INTERNAL FIXATION (ORIF) PROXIMAL PHALANX Right 07/23/2013   Procedure: OPEN TREATMENT RIGHT RING FINGER, PROXIMAL INTERPHALANGEAL/JOINT FRACTURE DISLOCATION;  Surgeon: Jolyn Nap, MD;  Location: [place];  Service: Orthopedics;  Laterality: Right;  . TONSILLECTOMY     and adenoidectomy    Family Psychiatric History: Mother PTSD, Father Bipolar disorder and PTSD  Family History:  Family History  Problem Relation Age of Onset  . Hypertension Mother   . Arnold-Chiari malformation Mother   . Restless legs syndrome Mother   . Osteoporosis Mother   . COPD Mother   . Asthma Mother   . Squamous cell carcinoma Mother        skin   . Eczema Mother   . Arthritis Mother   . Peripheral Artery Disease Mother   . Anxiety disorder Mother   . OCD Mother   . Hypertension Father   . Scoliosis Father   . Bipolar disorder Father   . Congestive Heart Failure Father   . COPD Father   . Depression Father   . Diabetes Maternal Grandmother   . Hypertension Maternal Grandmother   . Dementia Maternal Grandmother   . OCD Maternal Grandmother   . Cancer Paternal Grandmother        colon  . Bone cancer Maternal Grandfather 82       bone marrow   . Multiple myeloma Maternal Grandfather   . Alcohol abuse Maternal Grandfather   . Drug abuse Maternal Grandfather   . Diabetes Paternal Grandfather   . Alcohol abuse Paternal Grandfather   . Drug abuse Paternal Grandfather   . Dementia Paternal Grandfather   . Alcohol abuse Maternal Aunt   . Depression Maternal Aunt   . Alcohol abuse Paternal Aunt   . Depression Paternal Aunt   . ADD / ADHD Cousin     Social History:  Social History    Socioeconomic History  . Marital status: Single    Spouse name: Not on file  . Number of children: 0  . Years of education: college   . Highest education level: Not on file  Occupational History  . Occupation: Unemployed   Tobacco Use  . Smoking status: Never Smoker  . Smokeless tobacco: Never Used  Vaping Use  . Vaping Use: Never used  Substance and Sexual Activity  . Alcohol use: No  . Drug use: No  . Sexual activity: Yes    Birth control/protection: None  Other Topics Concern  . Not on file  Social History Narrative   Lives with mom [name])    Right-handed   Caffeine:  3 cups of coffee per day   Social Determinants of Health   Financial Resource Strain: Not on file  Food Insecurity: Food Insecurity Present  . Worried About Charity fundraiser in the Last Year: Sometimes true  . Ran Out of Food in the Last Year: Sometimes true  Transportation Needs: No Transportation Needs  . Lack of Transportation (Medical): No  . Lack of Transportation (Non-Medical): No  Physical Activity: Not on file  Stress: Not on file  Social Connections: Not on file    Allergies:  Allergies  Allergen Reactions  . Mucinex [Guaifenesin Er] Hives, Itching and Swelling  . Penicillins Swelling    Did it involve swelling of the face/tongue/throat, SOB, or low BP? Yes Did it involve sudden or severe rash/hives, skin peeling, or any reaction on the inside of your mouth or nose? No Did you need to seek medical attention at a hospital or doctor's office? Yes When did it last happen?>10 years ago If all above answers are "NO", may proceed with cephalosporin use.   . Contrast Media [Iodinated Diagnostic Agents] Nausea And Vomiting  . Latex Hives and Swelling  . Multihance [Gadobenate] Nausea And Vomiting  . Other     Pickles - throat swelling and nausea     Metabolic Disorder Labs: Lab Results  Component Value Date   HGBA1C 4.9 04/15/2019   MPG 93.93 04/15/2019   Lab Results   Component Value Date   PROLACTIN 15.9 09/18/2016   PROLACTIN 32.9 (H) 06/20/2016   Lab Results  Component Value Date   CHOL 204 (H) 04/15/2019   TRIG 146 04/15/2019   HDL 41 04/15/2019   CHOLHDL 5.0 04/15/2019   VLDL 29 04/15/2019   LDLCALC 134 (H) 04/15/2019   Lab Results  Component Value Date   TSH 4.628 (H) 04/15/2019   TSH 2.140 07/22/2017    Therapeutic Level Labs: No results found for: LITHIUM No results found for: VALPROATE No components found for:  CBMZ  Current Medications: Current Outpatient Medications  Medication Sig Dispense Refill  . albuterol (VENTOLIN HFA) 108 (90 Base) MCG/ACT inhaler Inhale 2 puffs into the lungs every 6 (six) hours as needed for wheezing or shortness of breath. 6.7 g 3  . busPIRone (BUSPAR) 15 MG tablet TAKE 1 TABLET (15 MG TOTAL) BY MOUTH 3 (THREE) TIMES DAILY. FOR ANXIETY 90 tablet 2  . diclofenac Sodium (VOLTAREN) 1 % GEL APPLY 2 G TOPICALLY 4 (FOUR) TIMES DAILY. 100 g 1  . gabapentin (NEURONTIN) 300 MG capsule TAKE 1 CAPSULE (300 MG TOTAL) BY MOUTH AT BEDTIME. 30 capsule 3  . hydrOXYzine (ATARAX/VISTARIL) 25 MG tablet TAKE 1 TABLET (25 MG TOTAL) BY MOUTH 3 (THREE) TIMES DAILY AS NEEDED FOR ANXIETY. 90 tablet 2  . loperamide (IMODIUM A-D) 2 MG tablet Take 2 mg by mouth 4 (four) times daily as needed for diarrhea or loose stools.    . naproxen (NAPROSYN) 500 MG tablet TAKE 1 TABLET (500 MG TOTAL) BY MOUTH 2 (TWO) TIMES DAILY WITH A MEAL. AS NEEDED FOR PAIN 60 tablet 1  . propranolol (INDERAL) 20 MG tablet Take 1 tablet (20 mg total) by mouth 2 (two) times daily. For anxiety 60 tablet 2  . QUEtiapine (SEROQUEL) 100 MG tablet TAKE 1 TABLET (100 MG TOTAL) BY MOUTH AT BEDTIME. 30 tablet 2  . sertraline (ZOLOFT) 100 MG tablet Take 1.5 tablets (150 mg total) by mouth at bedtime. 45 tablet 2  . traZODone (DESYREL) 50 MG tablet TAKE 1 TABLET (  50 MG TOTAL) BY MOUTH AT BEDTIME AS NEEDED FOR SLEEP. 30 tablet 2   No current facility-administered  medications for this visit.     Musculoskeletal: Strength & Muscle Tone: Unable to assess due to telehealth visit [place]: Unable to assess due to telehealth visit Patient leans: N/A  Psychiatric Specialty Exam: Review of Systems  There were no vitals taken for this visit.There is no height or weight on file to calculate BMI.  General Appearance: Well Groomed  Eye Contact:  Good  Speech:  Clear and Coherent and Normal Rate  Volume:  Normal  Mood:  Anxious and Depressed  Affect:  Congruent  Thought Process:  Coherent, Goal Directed and Linear  Orientation:  Full (Time, Place, and Person)  Thought Content: WDL and Logical   Suicidal Thoughts:  No  Homicidal Thoughts:  No  Memory:  Immediate;   Good Recent;   Good Remote;   Good  Judgement:  Good  Insight:  Good  Psychomotor Activity:  Normal  Concentration:  Concentration: Good and Attention Span: Good  Recall:  Good  Fund of Knowledge: Good  Language: Good  Akathisia:  No  Handed:  Right  AIMS (if indicated): Not done  Assets:  Communication Skills Desire for Improvement Financial Resources/Insurance Housing Social Support  ADL's:  Intact  Cognition: WNL  Sleep:  Fair   Screenings: [place] Admission (Discharged) from OP Visit from 04/14/2019 in [facility]  AIMS Total Score 0    AUDIT   Flowsheet Row Admission (Discharged) from OP Visit from 04/14/2019 in [place]  Alcohol Use Disorder Identification Test Final Score (AUDIT) 0    GAD-7   Flowsheet Row Video Visit from 05/15/2020 in [facility] Office Visit from 03/24/2020 in [place] Video Visit from 02/15/2020 in [facility] Office Visit from 01/07/2020 in [facility] [place] [facility] Office Visit from 11/05/2019 in [facility] [place] [facility]  Total GAD-7 Score _0 PHQ2-9   Flowsheet Row Video Visit from 05/15/2020 in [facility] Office Visit from 03/24/2020 in [place] Video Visit from 02/15/2020 in [facility] Office Visit from 01/07/2020 in [facility] [place] [facility] Video Visit from 11/18/2019 in [place]  PHQ-2 Total Score _1 PHQ-9 Total Score _2 Flowsheet Row Video Visit from 11/18/2019 in [facility] Admission (Discharged) from OP Visit from 04/14/2019 in [facility]  C-SSRS RISK CATEGORY Low Risk Moderate Risk       Assessment and Plan: Patient notes that she has feelings depressed and anxiety due to life stressors.  Today she is agreeable to increasing Zoloft 100 mg to 150 mg to help manage anxiety and depression.  She will continue all other medication as prescribed.  1. Social phobia  [place]- busPIRone (BUSPAR) 15 MG tablet; TAKE 1 TABLET (15 MG TOTAL) BY MOUTH 3 (THREE) TIMES DAILY. FOR ANXIETY  Dispense: 90 tablet; Refill: 2 Increased- sertraline (ZOLOFT) 100 MG tablet; Take 1.5 tablets (150 mg total) by mouth at bedtime.  Dispense: 45 tablet; Refill: 2 - hydrOXYzine (ATARAX/VISTARIL) 25 MG tablet; TAKE  1 TABLET (25 MG TOTAL) BY MOUTH 3 (THREE) TIMES DAILY AS NEEDED FOR ANXIETY.  Dispense: 90 tablet; Refill: 2  2. Moderate episode of recurrent major depressive disorder (HCC)  Conitnue- QUEtiapine (SEROQUEL) 100 MG tablet; TAKE 1 TABLET (100 MG TOTAL) BY MOUTH AT BEDTIME.  Dispense: 30 tablet; Refill: 2 Increased - sertraline (ZOLOFT) 100 MG tablet; Take 1.5 tablets (150 mg total) by mouth at bedtime.  Dispense: 45 tablet; Refill: 2 Conitnue- traZODone (DESYREL) 50 MG tablet; TAKE 1 TABLET (50 MG TOTAL) BY MOUTH AT BEDTIME AS NEEDED FOR  SLEEP.  Dispense: 30 tablet; Refill: 2    Follow-up in 3 months Follow-up therapy Salley Slaughter, NP 05/15/2020, 11:43 AM

## 2020-05-17 ENCOUNTER — Other Ambulatory Visit: Payer: Self-pay

## 2020-05-17 ENCOUNTER — Telehealth: Payer: Self-pay | Admitting: Internal Medicine

## 2020-05-17 ENCOUNTER — Ambulatory Visit (INDEPENDENT_AMBULATORY_CARE_PROVIDER_SITE_OTHER): Payer: Self-pay | Admitting: Physical Therapy

## 2020-05-17 ENCOUNTER — Encounter: Payer: Self-pay | Admitting: Physical Therapy

## 2020-05-17 ENCOUNTER — Encounter: Payer: Self-pay | Admitting: Internal Medicine

## 2020-05-17 DIAGNOSIS — M249 Joint derangement, unspecified: Secondary | ICD-10-CM

## 2020-05-17 DIAGNOSIS — R293 Abnormal posture: Secondary | ICD-10-CM

## 2020-05-17 DIAGNOSIS — M6281 Muscle weakness (generalized): Secondary | ICD-10-CM

## 2020-05-17 DIAGNOSIS — M546 Pain in thoracic spine: Secondary | ICD-10-CM

## 2020-05-17 NOTE — Telephone Encounter (Signed)
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 I was so confused. I was seeing her because her PCP was not available . I called ortho left a message and no return call. Before she can be seen by a rheumatologist labs have to warrant visit. I will also, forward this  message to Dr. Laural Benes. I was going to ask her were would or who would best be able to tx or help. 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  She essentially scores a 9/9 on the Beighton Scale for hypermobility.    I'm not sure if there is an EDS specialist in the area, and up to date recommends "geneticist and other EDS specialists to coordinate care for the patient with appropriate consultants (eg, experts in cardiovascular, orthopedic, rheumatologic, dermatologic, and behavioral health care)."  Maybe a geneticist should be the first referral? I mentioned rheumatology to her since I believe we have some rheumatologists in the area that treat EDS.  I'm not entirely certain as it's not something we treat all that often.  Let me know if you need anything else, or recommend anything else.  Thanks! Clarita Crane, PT, DPT 05/15/20 12:57 PM

## 2020-05-17 NOTE — Therapy (Signed)
[facility] [address] [place], [place], [address] Phone: [phone]   Fax:  [phone]  Physical Therapy Treatment  Patient Details  Name: Kristen [name] MRN: [id number] Date of Birth: April 21, 1985 Referring Provider (PT): Marcine Matar, MD   Encounter Date: 05/17/2020   PT End of Session - 05/17/20 1424    Visit Number 5    Number of Visits 12    Date for PT Re-Evaluation 06/09/20    PT Start Time 1340    PT Stop Time 1420    PT Time Calculation (min) 40 min    Activity Tolerance Patient tolerated treatment well    Behavior During Therapy [facility] for tasks assessed/performed           Past Medical History:  Diagnosis Date  . Anxiety 2013   treated at [facility] by Deatra Robinson   . Asthma 1989    no hospitalization, or intubation, previously on advair and singulair. asthma worsening.   . 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 Depression 2001   depressed since childhood   . 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 Essential hypertension 03/12/2019  . GERD (gastroesophageal reflux disease)   . History of hiatal hernia   . History of suicidal ideation   . IBS (irritable bowel syndrome)   . Kyphosis of thoracic region 2004    painful, runs in dad side   . Migraine   . Pancreatitis 2003   gallstone pancreatitis   . Pancreatitis due to biliary obstruction 2003  . PCOS (polycystic ovarian syndrome) 2014   facial hair, irregular periods, never been pregnant   . Sleep apnea    no longer uses cpap    Past Surgical History:  Procedure Laterality Date  . BLADDER SURGERY    . CHOLECYSTECTOMY  2003   . DILATATION & CURETTAGE/HYSTEROSCOPY WITH MYOSURE N/A 12/14/2019   Procedure: DILATATION & CURETTAGE/HYSTEROSCOPY WITH Polypectomy with MYOSURE;  Surgeon: Tereso Newcomer, MD;  Location[facility];  Service: Gynecology;  Laterality: N/A;  . OPEN REDUCTION INTERNAL FIXATION (ORIF) PROXIMAL PHALANX Right 07/23/2013    Procedure: OPEN TREATMENT RIGHT RING FINGER, PROXIMAL INTERPHALANGEAL/JOINT FRACTURE DISLOCATION;  Surgeon: Jodi Marble, MD;  Location: [facility];  Service: Orthopedics;  Laterality: Right;  . TONSILLECTOMY     and adenoidectomy    There were no vitals filed for this visit.   Subjective Assessment - 05/17/20 1342    Subjective feels better today but is drained from having stomach bug    Pertinent History anx/dep, IBS,  PCOS, morbid obesity, Dep, RLS, social phobia, lymphocytic hypophysitis, hyperprolactinemia, severe persistent asthma, migraines, OSA CPAP    Limitations Sitting;Standing    Diagnostic tests xrays: WNL    Patient Stated Goals improve pain    Currently in Pain? No/denies                             [facility] Adult PT Treatment/Exercise - 05/17/20 1343      Lumbar Exercises: Aerobic   Nustep L7 x 8 min      Lumbar Exercises: Machines for Strengthening   Other Lumbar Machine Exercise rows 20# 2x15    Other Lumbar Machine Exercise lat pull downs 2x15; 20#      Lumbar Exercises: Standing   Functional Squats 20 reps;3 seconds    Functional Squats Limitations TRX    Other Standing Lumbar Exercises  standing oblique 2x10 bil with 15# KB    Other Standing Lumbar Exercises cable walkout 45# 2x10; deadlift 15# 2x10                    PT Short Term Goals - 05/17/20 1424      PT SHORT TERM GOAL #1   Title independent with initial HEP    Status Achieved    Target Date 05/19/20             PT Long Term Goals - 05/17/20 1424      PT LONG TERM GOAL #1   Title independent with final HEP    Status On-going    Target Date 06/09/20      PT LONG TERM GOAL #2   Title FOTO score improved to 56 for improved function    Status On-going      PT LONG TERM GOAL #3   Title report pain < 4/10 with prolonged activity for improved function and mobility    Status On-going      PT LONG TERM GOAL #4   Title perform thoracic ROM  without increase in pain for improved function    Status On-going                 Plan - 05/17/20 1425    Clinical Impression Statement Pt with decreasing reports of pain and overall doing well with PT.  Continue to focus on low impact strengthening and endurance due to hypermobility.  Will continue to benefit from PT to maximize function.    Personal Factors and Comorbidities Comorbidity 3+;Social Background;Past/Current Experience;Fitness;Finances    Comorbidities anx/dep, IBS,  PCOS, morbid obesity, Dep, RLS, social phobia, lymphocytic hypophysitis, hyperprolactinemia, severe persistent asthma, migraines, OSA CPAP    Examination-Activity Limitations Locomotion Level;Reach Overhead;Bend;Caring for Others;Sit;Squat;Stairs;Lift;Stand    Examination-Participation Restrictions Community Activity;Meal Prep;Laundry;Shop    Stability/Clinical Decision Making Evolving/Moderate complexity    Rehab Potential Fair    PT Frequency 2x / week    PT Duration 6 weeks    PT Treatment/Interventions ADLs/Self Care Home Management;Cryotherapy;Electrical Stimulation;Traction;Ultrasound;Moist Heat;Stair training;Gait training;Functional mobility training;Therapeutic activities;Therapeutic exercise;Balance training;Patient/family education;Neuromuscular re-education;Manual techniques;Taping;Dry needling    PT Next Visit Plan focus on endurance/strengthening/thoracic extension/posture exercises - pt hypermobile so needs strengthening focus    PT Home Exercise Plan Access Code: [email or web address]    Consulted and Agree with Plan of Care Patient           Patient will benefit from skilled therapeutic intervention in order to improve the following deficits and impairments:  Pain,Improper body mechanics,Postural dysfunction,Decreased activity tolerance,Decreased endurance,Decreased strength,Impaired flexibility  Visit Diagnosis: Pain in thoracic spine  Abnormal posture  Muscle weakness  (generalized)     Problem List Patient Active Problem List   Diagnosis Date Noted  . Morbid obesity (HCC) 12/14/2019  . Endometrial polyp   . Moderate episode of recurrent major depressive disorder (HCC) 11/18/2019  . Encounter for autism screening 11/18/2019  . Abnormal uterine bleeding (AUB) 11/08/2019  . Severe episode of recurrent major depressive disorder, 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  . PTSD (post-traumatic stress disorder) 09/20/2019  . 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 Abnormal serum thyroid stimulating hormone (TSH) level 04/23/2019  . MDD (major depressive disorder), recurrent severe, 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  . OSA on CPAP 03/17/2019  . Anxiety and depression 03/12/2019  . Chronic migraine with aura 03/12/2019  . Essential hypertension 03/12/2019  . Mild intermittent asthma without complication 03/12/2019  . 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 Elevated prolactin level 07/05/2016  . Vulvar lesion 07/05/2016  . Restless legs 05/24/2016  . Seasonal allergies 05/13/2014  . Intertrigo 05/13/2014  . Decreased vision 05/13/2014  . PCOS (polycystic ovarian syndrome) 02/07/2014  . Social phobia 02/02/2007  . ADD 02/02/2007  . 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 Asthma, severe persistent 02/02/2007  . SCOLIOSIS 02/02/2007  . OTHER SPECIFIED CONGENITAL ANOMALIES OF KIDNEY 02/02/2007  . ELECTROCARDIOGRAM, ABNORMAL 02/02/2007      Clarita Crane, PT, DPT 05/17/20 2:26 PM    [facility] [address] [place], [place], [address] Phone: [phone]   Fax:  [phone]  Name: Kristen [name] MRN: [id number] Date of Birth: 1985-06-21

## 2020-05-22 ENCOUNTER — Encounter: Payer: Self-pay | Admitting: Physical Therapy

## 2020-05-22 ENCOUNTER — Other Ambulatory Visit: Payer: Self-pay

## 2020-05-24 ENCOUNTER — Other Ambulatory Visit: Payer: Self-pay

## 2020-05-24 ENCOUNTER — Ambulatory Visit (INDEPENDENT_AMBULATORY_CARE_PROVIDER_SITE_OTHER): Payer: Self-pay | Admitting: Physical Therapy

## 2020-05-24 ENCOUNTER — Encounter: Payer: Self-pay | Admitting: Physical Therapy

## 2020-05-24 DIAGNOSIS — M6281 Muscle weakness (generalized): Secondary | ICD-10-CM

## 2020-05-24 DIAGNOSIS — R293 Abnormal posture: Secondary | ICD-10-CM

## 2020-05-24 DIAGNOSIS — M546 Pain in thoracic spine: Secondary | ICD-10-CM

## 2020-05-24 NOTE — Therapy (Signed)
[facility] Physical Therapy [address] [place], [place], [address] Phone: [phone]   Fax:  [phone]  Physical Therapy Treatment  Patient Details  Name: Kristen [name] MRN: [id number] Date of Birth: 03/04/1985 Referring Provider (PT): Marcine Matar, MD   Encounter Date: 05/24/2020   PT End of Session - 05/24/20 1126    Visit Number 6    Number of Visits 12    Date for PT Re-Evaluation 06/09/20    PT Start Time 1100    PT Stop Time 1138    PT Time Calculation (min) 38 min    Activity Tolerance Patient tolerated treatment well    Behavior During Therapy [facility] for tasks assessed/performed           Past Medical History:  Diagnosis Date  . Anxiety 2013   treated at [facility] by Deatra Robinson   . Asthma 1989    no hospitalization, or intubation, previously on advair and singulair. asthma worsening.   . 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 Depression 2001   depressed since childhood   . 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 Essential hypertension 03/12/2019  . GERD (gastroesophageal reflux disease)   . History of hiatal hernia   . History of suicidal ideation   . IBS (irritable bowel syndrome)   . Kyphosis of thoracic region 2004    painful, runs in dad side   . Migraine   . Pancreatitis 2003   gallstone pancreatitis   . Pancreatitis due to biliary obstruction 2003  . PCOS (polycystic ovarian syndrome) 2014   facial hair, irregular periods, never been pregnant   . Sleep apnea    no longer uses cpap    Past Surgical History:  Procedure Laterality Date  . BLADDER SURGERY    . CHOLECYSTECTOMY  2003   . DILATATION & CURETTAGE/HYSTEROSCOPY WITH MYOSURE N/A 12/14/2019   Procedure: DILATATION & CURETTAGE/HYSTEROSCOPY WITH Polypectomy with MYOSURE;  Surgeon: Tereso Newcomer, MD;  Location: [facility] OR;  Service: Gynecology;  Laterality: N/A;  . OPEN REDUCTION INTERNAL FIXATION (ORIF) PROXIMAL PHALANX Right 07/23/2013    Procedure: OPEN TREATMENT RIGHT RING FINGER, PROXIMAL INTERPHALANGEAL/JOINT FRACTURE DISLOCATION;  Surgeon: Jodi Marble, MD;  Location:  SURGERY CENTER;  Service: Orthopedics;  Laterality: Right;  . TONSILLECTOMY     and adenoidectomy    There were no vitals filed for this visit.   Subjective Assessment - 05/24/20 1105    Subjective Pt reporting pain today of 4/10. Pt reporting more pain on the bottom of her feet. Also reporting popping in her right knee.    Pertinent History anx/dep, IBS,  PCOS, morbid obesity, Dep, RLS, social phobia, lymphocytic hypophysitis, hyperprolactinemia, severe persistent asthma, migraines, OSA CPAP    Limitations Sitting;Standing    Diagnostic tests xrays: WNL    Patient Stated Goals improve pain    Currently in Pain? Yes    Pain Score 4     Pain Location Foot    Pain Orientation Right;Left    Pain Descriptors / Indicators Sore    Pain Type Acute pain    Pain Onset More than a month ago                             [facility] Adult PT Treatment/Exercise - 05/24/20 0001      Lumbar Exercises: Stretches   Other Lumbar Stretch Exercise Door stretch x 6  holding 10 seconds each      Lumbar Exercises: Aerobic   Recumbent Bike L3 UE and LE's x 7 minutes    Nustep --      Lumbar Exercises: Machines for Strengthening   Other Lumbar Machine Exercise rows 20# 2x15    Other Lumbar Machine Exercise lat pull downs 2x15; 20#      Lumbar Exercises: Standing   Functional Squats 20 reps;3 seconds    Functional Squats Limitations TRX    Other Standing Lumbar Exercises cable walk outs 45# x15, instructions of posture control, elbows sligthly bent    Other Standing Lumbar Exercises dead lift x 15# x 15, BATCA D2 10# 2x10                    PT Short Term Goals - 05/17/20 1424      PT SHORT TERM GOAL #1   Title independent with initial HEP    Status Achieved    Target Date 05/19/20             PT Long Term Goals -  05/24/20 1150      PT LONG TERM GOAL #1   Title independent with final HEP    Status On-going      PT LONG TERM GOAL #2   Title FOTO score improved to 56 for improved function    Status On-going      PT LONG TERM GOAL #3   Title report pain < 4/10 with prolonged activity for improved function and mobility    Status On-going      PT LONG TERM GOAL #4   Title perform thoracic ROM without increase in pain for improved function    Status On-going                 Plan - 05/24/20 1146    Clinical Impression Statement Pt with overall improvments since starting therapy. Pt tolerating exercises well with instructions to prevent compensation.  Pt was instructed to continue with her HEP for strengthening and stability. Continue skilled PT to maximise function.    Personal Factors and Comorbidities Comorbidity 3+;Social Background;Past/Current Experience;Fitness;Finances    Comorbidities anx/dep, IBS,  PCOS, morbid obesity, Dep, RLS, social phobia, lymphocytic hypophysitis, hyperprolactinemia, severe persistent asthma, migraines, OSA CPAP    Examination-Activity Limitations Locomotion Level;Reach Overhead;Bend;Caring for Others;Sit;Squat;Stairs;Lift;Stand    Examination-Participation Restrictions Community Activity;Meal Prep;Laundry;Shop    Stability/Clinical Decision Making Evolving/Moderate complexity    Rehab Potential Fair    PT Frequency 2x / week    PT Duration 6 weeks    PT Treatment/Interventions ADLs/Self Care Home Management;Cryotherapy;Electrical Stimulation;Traction;Ultrasound;Moist Heat;Stair training;Gait training;Functional mobility training;Therapeutic activities;Therapeutic exercise;Balance training;Patient/family education;Neuromuscular re-education;Manual techniques;Taping;Dry needling    PT Next Visit Plan focus on endurance/strengthening/thoracic extension/posture exercises - pt hypermobile so needs strengthening focus    PT Home Exercise Plan Access Code: [email or web address]     Consulted and Agree with Plan of Care Patient           Patient will benefit from skilled therapeutic intervention in order to improve the following deficits and impairments:  Pain,Improper body mechanics,Postural dysfunction,Decreased activity tolerance,Decreased endurance,Decreased strength,Impaired flexibility  Visit Diagnosis: Pain in thoracic spine  Abnormal posture  Muscle weakness (generalized)     Problem List Patient Active Problem List   Diagnosis Date Noted  . Morbid obesity (HCC) 12/14/2019  . Endometrial polyp   . Moderate episode of recurrent major depressive disorder (HCC) 11/18/2019  . Encounter for autism screening 11/18/2019  . Abnormal uterine  bleeding (AUB) 11/08/2019  . Severe episode of recurrent major depressive disorder, 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  . PTSD (post-traumatic stress disorder) 09/20/2019  . 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 Abnormal serum thyroid stimulating hormone (TSH) level 04/23/2019  . MDD (major depressive disorder), recurrent severe, 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  . OSA on CPAP 03/17/2019  . Anxiety and depression 03/12/2019  . Chronic migraine with aura 03/12/2019  . Essential hypertension 03/12/2019  . Mild intermittent asthma without complication 03/12/2019  . 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 Elevated prolactin level 07/05/2016  . Vulvar lesion 07/05/2016  . Restless legs 05/24/2016  . Seasonal allergies 05/13/2014  . Intertrigo 05/13/2014  . Decreased vision 05/13/2014  . PCOS (polycystic ovarian syndrome) 02/07/2014  . Social phobia 02/02/2007  . ADD 02/02/2007  . 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 Asthma, severe persistent 02/02/2007  . SCOLIOSIS 02/02/2007  . OTHER SPECIFIED CONGENITAL ANOMALIES OF KIDNEY 02/02/2007  . ELECTROCARDIOGRAM, ABNORMAL 02/02/2007    Sharmon Leyden, PT, MPT 05/24/2020, 11:51 AM  [facility] Physical Therapy [address] [place], [place], [address] Phone: [phone]   Fax:  [phone]  Name: Kristen [name] MRN: [id number] Date of Birth: 05-Apr-1985

## 2020-05-29 ENCOUNTER — Ambulatory Visit (INDEPENDENT_AMBULATORY_CARE_PROVIDER_SITE_OTHER): Payer: Self-pay | Admitting: Physical Therapy

## 2020-05-29 ENCOUNTER — Encounter: Payer: Self-pay | Admitting: Physical Therapy

## 2020-05-29 ENCOUNTER — Other Ambulatory Visit: Payer: Self-pay

## 2020-05-29 DIAGNOSIS — M6281 Muscle weakness (generalized): Secondary | ICD-10-CM

## 2020-05-29 DIAGNOSIS — R293 Abnormal posture: Secondary | ICD-10-CM

## 2020-05-29 DIAGNOSIS — M546 Pain in thoracic spine: Secondary | ICD-10-CM

## 2020-05-29 NOTE — Therapy (Signed)
[facility] Physical Therapy [address] [place], [place], [address] Phone: [phone]   Fax:  [phone]  Physical Therapy Treatment  Patient Details  Name: Kristen [name] MRN: [id number] Date of Birth: 14-Dec-1985 Referring Provider (PT): Marcine Matar, MD   Encounter Date: 05/29/2020   PT End of Session - 05/29/20 1420    Visit Number 7    Number of Visits 12    Date for PT Re-Evaluation 06/09/20    PT Start Time 1339    PT Stop Time 1420    PT Time Calculation (min) 41 min    Activity Tolerance Patient tolerated treatment well    Behavior During Therapy [facility] for tasks assessed/performed           Past Medical History:  Diagnosis Date  . Anxiety 2013   treated at [facility] by Deatra Robinson   . Asthma 1989    no hospitalization, or intubation, previously on advair and singulair. asthma worsening.   . 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 Depression 2001   depressed since childhood   . 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 Essential hypertension 03/12/2019  . GERD (gastroesophageal reflux disease)   . History of hiatal hernia   . History of suicidal ideation   . IBS (irritable bowel syndrome)   . Kyphosis of thoracic region 2004    painful, runs in dad side   . Migraine   . Pancreatitis 2003   gallstone pancreatitis   . Pancreatitis due to biliary obstruction 2003  . PCOS (polycystic ovarian syndrome) 2014   facial hair, irregular periods, never been pregnant   . Sleep apnea    no longer uses cpap    Past Surgical History:  Procedure Laterality Date  . BLADDER SURGERY    . CHOLECYSTECTOMY  2003   . DILATATION & CURETTAGE/HYSTEROSCOPY WITH MYOSURE N/A 12/14/2019   Procedure: DILATATION & CURETTAGE/HYSTEROSCOPY WITH Polypectomy with MYOSURE;  Surgeon: Tereso Newcomer, MD;  Location: [facility] OR;  Service: Gynecology;  Laterality: N/A;  . OPEN REDUCTION INTERNAL FIXATION (ORIF) PROXIMAL PHALANX Right 07/23/2013    Procedure: OPEN TREATMENT RIGHT RING FINGER, PROXIMAL INTERPHALANGEAL/JOINT FRACTURE DISLOCATION;  Surgeon: Jodi Marble, MD;  Location:  SURGERY CENTER;  Service: Orthopedics;  Laterality: Right;  . TONSILLECTOMY     and adenoidectomy    There were no vitals filed for this visit.   Subjective Assessment - 05/29/20 1347    Subjective knees are hurting more today, back is still a little sore but better.    Pertinent History anx/dep, IBS,  PCOS, morbid obesity, Dep, RLS, social phobia, lymphocytic hypophysitis, hyperprolactinemia, severe persistent asthma, migraines, OSA CPAP    Limitations Sitting;Standing    Diagnostic tests xrays: WNL    Patient Stated Goals improve pain    Currently in Pain? Yes    Pain Score 4     Pain Location Back    Pain Orientation Right;Left    Pain Descriptors / Indicators Sore    Pain Type Acute pain    Pain Onset More than a month ago    Pain Frequency Intermittent    Aggravating Factors  lifting, bending, sitting at desk    Pain Relieving Factors lying flat                             [facility] Adult PT Treatment/Exercise - 05/29/20 1349  Lumbar Exercises: Aerobic   Nustep L5 x 8 min      Lumbar Exercises: Machines for Strengthening   Cybex Knee Extension 10# 2x15    Other Lumbar Machine Exercise rows 20# 2x15    Other Lumbar Machine Exercise lat pull downs 2x15; 20#      Lumbar Exercises: Standing   Other Standing Lumbar Exercises cable walk outs 45# x15, instructions of posture control, elbows sligthly bent    Other Standing Lumbar Exercises dead lift x 15# x 15, BATCA D2 10# 2x10                    PT Short Term Goals - 05/17/20 1424      PT SHORT TERM GOAL #1   Title independent with initial HEP    Status Achieved    Target Date 05/19/20             PT Long Term Goals - 05/24/20 1150      PT LONG TERM GOAL #1   Title independent with final HEP    Status On-going      PT LONG TERM  GOAL #2   Title FOTO score improved to 56 for improved function    Status On-going      PT LONG TERM GOAL #3   Title report pain < 4/10 with prolonged activity for improved function and mobility    Status On-going      PT LONG TERM GOAL #4   Title perform thoracic ROM without increase in pain for improved function    Status On-going                 Plan - 05/29/20 1422    Clinical Impression Statement Continued focus on strengthening, and overall progressing well with PT.  Will continue to benefit from PT to maximize function.    Personal Factors and Comorbidities Comorbidity 3+;Social Background;Past/Current Experience;Fitness;Finances    Comorbidities anx/dep, IBS,  PCOS, morbid obesity, Dep, RLS, social phobia, lymphocytic hypophysitis, hyperprolactinemia, severe persistent asthma, migraines, OSA CPAP    Examination-Activity Limitations Locomotion Level;Reach Overhead;Bend;Caring for Others;Sit;Squat;Stairs;Lift;Stand    Examination-Participation Restrictions Community Activity;Meal Prep;Laundry;Shop    Stability/Clinical Decision Making Evolving/Moderate complexity    Rehab Potential Fair    PT Frequency 2x / week    PT Duration 6 weeks    PT Treatment/Interventions ADLs/Self Care Home Management;Cryotherapy;Electrical Stimulation;Traction;Ultrasound;Moist Heat;Stair training;Gait training;Functional mobility training;Therapeutic activities;Therapeutic exercise;Balance training;Patient/family education;Neuromuscular re-education;Manual techniques;Taping;Dry needling    PT Next Visit Plan focus on endurance/strengthening/thoracic extension/posture exercises - pt hypermobile so needs strengthening focus    PT Home Exercise Plan Access Code: [email or web address]    Consulted and Agree with Plan of Care Patient           Patient will benefit from skilled therapeutic intervention in order to improve the following deficits and impairments:  Pain,Improper body mechanics,Postural  dysfunction,Decreased activity tolerance,Decreased endurance,Decreased strength,Impaired flexibility  Visit Diagnosis: Pain in thoracic spine  Abnormal posture  Muscle weakness (generalized)     Problem List Patient Active Problem List   Diagnosis Date Noted  . Morbid obesity (HCC) 12/14/2019  . Endometrial polyp   . Moderate episode of recurrent major depressive disorder (HCC) 11/18/2019  . Encounter for autism screening 11/18/2019  . Abnormal uterine bleeding (AUB) 11/08/2019  . Severe episode of recurrent major depressive disorder, 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  . PTSD (post-traumatic stress disorder) 09/20/2019  . 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 Abnormal serum thyroid stimulating hormone (TSH)  level 04/23/2019  . MDD (major depressive disorder), recurrent severe, 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  . OSA on CPAP 03/17/2019  . Anxiety and depression 03/12/2019  . Chronic migraine with aura 03/12/2019  . Essential hypertension 03/12/2019  . Mild intermittent asthma without complication 03/12/2019  . 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 Elevated prolactin level 07/05/2016  . Vulvar lesion 07/05/2016  . Restless legs 05/24/2016  . Seasonal allergies 05/13/2014  . Intertrigo 05/13/2014  . Decreased vision 05/13/2014  . PCOS (polycystic ovarian syndrome) 02/07/2014  . Social phobia 02/02/2007  . ADD 02/02/2007  . 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 Asthma, severe persistent 02/02/2007  . SCOLIOSIS 02/02/2007  . OTHER SPECIFIED CONGENITAL ANOMALIES OF KIDNEY 02/02/2007  . ELECTROCARDIOGRAM, ABNORMAL 02/02/2007      Clarita Crane, PT, DPT 05/29/20 2:23 PM    [facility] Physical Therapy [address] [place], [place], [address] Phone: [phone]   Fax:  [phone]  Name: Vicie Cech MRN: [id number] Date of Birth: 06-Mar-1985

## 2020-05-31 ENCOUNTER — Ambulatory Visit (INDEPENDENT_AMBULATORY_CARE_PROVIDER_SITE_OTHER): Payer: Self-pay | Admitting: Physical Therapy

## 2020-05-31 ENCOUNTER — Other Ambulatory Visit: Payer: Self-pay

## 2020-05-31 ENCOUNTER — Encounter: Payer: Self-pay | Admitting: Physical Therapy

## 2020-05-31 DIAGNOSIS — M6281 Muscle weakness (generalized): Secondary | ICD-10-CM

## 2020-05-31 DIAGNOSIS — R293 Abnormal posture: Secondary | ICD-10-CM

## 2020-05-31 DIAGNOSIS — M546 Pain in thoracic spine: Secondary | ICD-10-CM

## 2020-05-31 NOTE — Therapy (Signed)
[facility] Physical Therapy [address] [place], [place], [address] Phone: [phone]   Fax:  [phone]  Physical Therapy Treatment  Patient Details  Name: Kristen [name] MRN: [id number] Date of Birth: 1985-05-02 Referring Provider (PT): Marcine Matar, MD   Encounter Date: 05/31/2020   PT End of Session - 05/31/20 1426    Visit Number 8    Number of Visits 12    Date for PT Re-Evaluation 06/09/20    PT Start Time 1343    PT Stop Time 1422    PT Time Calculation (min) 39 min    Activity Tolerance Patient tolerated treatment well    Behavior During Therapy [facility] for tasks assessed/performed           Past Medical History:  Diagnosis Date  . Anxiety 2013   treated at [facility] by Deatra Robinson   . Asthma 1989    no hospitalization, or intubation, previously on advair and singulair. asthma worsening.   . 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 Depression 2001   depressed since childhood   . 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 Essential hypertension 03/12/2019  . GERD (gastroesophageal reflux disease)   . History of hiatal hernia   . History of suicidal ideation   . IBS (irritable bowel syndrome)   . Kyphosis of thoracic region 2004    painful, runs in dad side   . Migraine   . Pancreatitis 2003   gallstone pancreatitis   . Pancreatitis due to biliary obstruction 2003  . PCOS (polycystic ovarian syndrome) 2014   facial hair, irregular periods, never been pregnant   . Sleep apnea    no longer uses cpap    Past Surgical History:  Procedure Laterality Date  . BLADDER SURGERY    . CHOLECYSTECTOMY  2003   . DILATATION & CURETTAGE/HYSTEROSCOPY WITH MYOSURE N/A 12/14/2019   Procedure: DILATATION & CURETTAGE/HYSTEROSCOPY WITH Polypectomy with MYOSURE;  Surgeon: Tereso Newcomer, MD;  Location: [facility];  Service: Gynecology;  Laterality: N/A;  . OPEN REDUCTION INTERNAL FIXATION (ORIF) PROXIMAL PHALANX Right 07/23/2013    Procedure: OPEN TREATMENT RIGHT RING FINGER, PROXIMAL INTERPHALANGEAL/JOINT FRACTURE DISLOCATION;  Surgeon: Jodi Marble, MD;  Location: [facility];  Service: Orthopedics;  Laterality: Right;  . TONSILLECTOMY     and adenoidectomy    There were no vitals filed for this visit.   Subjective Assessment - 05/31/20 1345    Subjective tired today, overall doing well    Pertinent History anx/dep, IBS,  PCOS, morbid obesity, Dep, RLS, social phobia, lymphocytic hypophysitis, hyperprolactinemia, severe persistent asthma, migraines, OSA CPAP    Limitations Sitting;Standing    Diagnostic tests xrays: WNL    Patient Stated Goals improve pain    Currently in Pain? Yes    Pain Score 0-No pain   just tightness   Pain Location Back    Pain Orientation Right;Left    Pain Descriptors / Indicators Sore    Pain Type Acute pain    Pain Onset More than a month ago    Pain Frequency Intermittent                             [facility] Adult PT Treatment/Exercise - 05/31/20 1347      Lumbar Exercises: Aerobic   Nustep L7 x 10 min      Lumbar Exercises: Machines for Strengthening  Other Lumbar Machine Exercise rows 25# 2x15    Other Lumbar Machine Exercise lat pull downs 2x15; 25#      Lumbar Exercises: Standing   Other Standing Lumbar Exercises cable walk outs 45# x15, standing oblique 2x10 bil with 15# KB    Other Standing Lumbar Exercises squats with box pick up 2x10; deadlifts with 15# KB 2x10                    PT Short Term Goals - 05/17/20 1424      PT SHORT TERM GOAL #1   Title independent with initial HEP    Status Achieved    Target Date 05/19/20             PT Long Term Goals - 05/24/20 1150      PT LONG TERM GOAL #1   Title independent with final HEP    Status On-going      PT LONG TERM GOAL #2   Title FOTO score improved to 56 for improved function    Status On-going      PT LONG TERM GOAL #3   Title report pain < 4/10 with  prolonged activity for improved function and mobility    Status On-going      PT LONG TERM GOAL #4   Title perform thoracic ROM without increase in pain for improved function    Status On-going                 Plan - 05/31/20 1427    Clinical Impression Statement Pt tolerated session well today with minimal reports of pain at this time.  Anticipate will be ready for transition to community and home exercise next 1-2 visits.    Personal Factors and Comorbidities Comorbidity 3+;Social Background;Past/Current Experience;Fitness;Finances    Comorbidities anx/dep, IBS,  PCOS, morbid obesity, Dep, RLS, social phobia, lymphocytic hypophysitis, hyperprolactinemia, severe persistent asthma, migraines, OSA CPAP    Examination-Activity Limitations Locomotion Level;Reach Overhead;Bend;Caring for Others;Sit;Squat;Stairs;Lift;Stand    Examination-Participation Restrictions Community Activity;Meal Prep;Laundry;Shop    Stability/Clinical Decision Making Evolving/Moderate complexity    Rehab Potential Fair    PT Frequency 2x / week    PT Duration 6 weeks    PT Treatment/Interventions ADLs/Self Care Home Management;Cryotherapy;Electrical Stimulation;Traction;Ultrasound;Moist Heat;Stair training;Gait training;Functional mobility training;Therapeutic activities;Therapeutic exercise;Balance training;Patient/family education;Neuromuscular re-education;Manual techniques;Taping;Dry needling    PT Next Visit Plan focus on endurance/strengthening/thoracic extension/posture exercises - pt hypermobile so needs strengthening focus; check goals, plan for d/c or hold/check in    PT Home Exercise Plan Access Code: [email or web address]    Consulted and Agree with Plan of Care Patient           Patient will benefit from skilled therapeutic intervention in order to improve the following deficits and impairments:  Pain,Improper body mechanics,Postural dysfunction,Decreased activity tolerance,Decreased endurance,Decreased  strength,Impaired flexibility  Visit Diagnosis: Pain in thoracic spine  Abnormal posture  Muscle weakness (generalized)     Problem List Patient Active Problem List   Diagnosis Date Noted  . Morbid obesity (HCC) 12/14/2019  . Endometrial polyp   . Moderate episode of recurrent major depressive disorder (HCC) 11/18/2019  . Encounter for autism screening 11/18/2019  . Abnormal uterine bleeding (AUB) 11/08/2019  . Severe episode of recurrent major depressive disorder, 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  . PTSD (post-traumatic stress disorder) 09/20/2019  . 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 Abnormal serum thyroid stimulating hormone (TSH) level 04/23/2019  . MDD (major depressive disorder), recurrent severe, without psychosis (HCC)  04/15/2019  . OSA on CPAP 03/17/2019  . Anxiety and depression 03/12/2019  . Chronic migraine with aura 03/12/2019  . Essential hypertension 03/12/2019  . Mild intermittent asthma without complication 03/12/2019  . 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 Elevated prolactin level 07/05/2016  . Vulvar lesion 07/05/2016  . Restless legs 05/24/2016  . Seasonal allergies 05/13/2014  . Intertrigo 05/13/2014  . Decreased vision 05/13/2014  . PCOS (polycystic ovarian syndrome) 02/07/2014  . Social phobia 02/02/2007  . ADD 02/02/2007  . 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 Asthma, severe persistent 02/02/2007  . SCOLIOSIS 02/02/2007  . OTHER SPECIFIED CONGENITAL ANOMALIES OF KIDNEY 02/02/2007  . ELECTROCARDIOGRAM, ABNORMAL 02/02/2007      Clarita Crane, PT, DPT 05/31/20 2:28 PM    [facility] Physical Therapy [address] [place], [place], [address] Phone: [phone]   Fax:  [phone]  Name: Jaionna Weisse MRN: [id number] Date of Birth: 03/12/1985

## 2020-06-02 ENCOUNTER — Telehealth: Payer: Self-pay | Admitting: Internal Medicine

## 2020-06-02 NOTE — Telephone Encounter (Signed)
-----   Message from Dionne Bucy sent at 06/02/2020 10:30 AM EDT ----- Regarding: Genetics Good Morning   Dr Laural Benes  I been having a hard time to find a referral for Genetics for Mrs Riggenbach  she is uninsured too. I contact ed her Pt the one who suggest that to see if she can help me  with no respond  . I just wanted to keep you update  . Have a [employer] of the day  .

## 2020-06-05 ENCOUNTER — Encounter: Payer: Self-pay | Admitting: Physical Therapy

## 2020-06-06 ENCOUNTER — Encounter: Payer: Self-pay | Admitting: Physical Therapy

## 2020-06-09 ENCOUNTER — Ambulatory Visit (INDEPENDENT_AMBULATORY_CARE_PROVIDER_SITE_OTHER): Payer: Self-pay | Admitting: Family Medicine

## 2020-06-09 ENCOUNTER — Other Ambulatory Visit: Payer: Self-pay

## 2020-06-09 VITALS — BP 173/104 | Ht 66.5 in | Wt 319.0 lb

## 2020-06-09 DIAGNOSIS — G90A Postural orthostatic tachycardia syndrome (POTS): Secondary | ICD-10-CM

## 2020-06-09 DIAGNOSIS — I498 Other specified cardiac arrhythmias: Secondary | ICD-10-CM

## 2020-06-09 DIAGNOSIS — Q796 Ehlers-Danlos syndrome, unspecified: Secondary | ICD-10-CM

## 2020-06-09 NOTE — Progress Notes (Signed)
Office Visit Note   Patient: Kristen [name]           Date of Birth: 04-30-1985           MRN: [id number] Visit Date: 06/09/2020 Requested by: Ladell Pier, MD [place],  [place] [id number] PCP: Ladell Pier, MD  Subjective: CC: Hypermobility Evaluation  HPI: 35 year old female presenting to clinic today for hypermobility evaluation.  Patient's past medical history is significant for multiple joint dislocations, within her elbow (recurrent nursemaid's elbow), bilateral shoulders, bilateral hips, and multiple frank patellar dislocations.  Patient states that she has struggled with joint pain and hypermobility throughout her life, and indeed her mother states that she was born with congenital hip dislocations.  She has met with physical therapy for multiple joint pains in the past, and her recent physical therapist suggested coming here for evaluation of her significant flexibility.  Patient says her fingers have always "popped out" and she endorses numerous subluxations of various joints with simple daily activities like walking.  She also endorses a recurrent orthostatic syncope, and was told that she had "extra heartbeats" many years ago during a Holter evaluation for the same. Her mother says that there is a strong family history of hypermobility, but no one has ever been evaluated for it.  She says that the patient's father has had numerous shoulder dislocations.  Numerous members on her father's family have also been bothered with joint dislocations, and significant scoliosis. 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  She is also struggled with migraines and anxiety.  Patient states that her primary objective today is to "find a name" that can explain her symptoms.              ROS:   All other systems were reviewed and are negative.  Objective: Vital Signs:  BP (!) 173/104   Ht 5' 6.5" (1.689 m)   Wt (!) 319 lb (144.7 kg)   BMI 50.72 kg/m  No flowsheet data found.   No flowsheet data found.  Physical Exam:  General:  Alert and oriented, in no acute distress. Pulm: Clear to auscultation bilaterally.  No accessory muscle use.  Able to speak in complete sentences. Cardiac: Regular rate and rhythm, no murmurs, no rubs, no gallops.  No cyanosis. Psy:  Normal mood, congruent affect. Skin: Skin laxity is appreciated on neck and hands.  No obvious striate.  Musculoskeletal:   Beighton score of 6 out of 7:  Bilateral elbows demonstrate extension approximately 5 degrees beyond neutral. Bilateral thumbs easily able to reach forearms. Bilateral pinkies able to extend to forearms. Currently unable to place palms on floor (though states she has done this in the past)   Imaging: None today.  Assessment & Plan: Pleasant 35 year old female presenting to clinic with her mother today with concerns of lifelong hypermobility and numerous joint dislocations, subluxations, and pain.  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  We will place referral to cardiology for assistance with this evaluation. -Discussed the importance of working with a physical therapist who is familiar with the needs of hypermobility patients.  We will place referral to Dr. Guido Sander, DPT for assistance with care. -Unsure if there is any relation with her renal or pituitary problems.  These do not fit into any obvious  EDS subtypes. -Discussed with patient that there is no current need for genetic testing, as the diagnosis is largely clinical, and testing does not change management.  Patient and her mother are agreeable with this. -Patient is welcome to return to clinic at any time for musculoskeletal concerns, or concerns with navigating life with EDS. -Patient and her  mother had no further questions or concerns today.  They are happy at the completion of today's visit.     Procedures: No procedures performed

## 2020-06-09 NOTE — Progress Notes (Signed)
[facility]: Attending Note: I have reviewed the chart, discussed wit the Sports Medicine Fellow. I agree with assessment and treatment plan as detailed in the Fellow's note. 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  She also has a lot of other interesting things such as extra kidney, history of pituitary adenoma etc.  I do not think these things follow under Ehlers-Danlos.  She does have a history of migraines and that may be associated with her EDS.

## 2020-06-11 NOTE — Progress Notes (Signed)
Cardiology Office Note:    Date:  06/14/2020   ID:  Kristen [name], DOB [birth year], MRN [id number]  PCP:  Ladell Pier, MD   [facility] Providers Cardiologist:  None {    Referring MD: Dickie La, MD   History of Present Illness:    Kristen [name] is a 35 y.o. female with a hx of anxiety, depression, HTN, IBS, migraines, and hypermobility who was referred by Dr. Nori Riis for further evaluation of possible Drue Dun and orthostatic symptoms.  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  Also with history of recurrent orthostatic syncope. She is now referred to clinic for further management.  Today, the patient states that when she changes positions and goes from a laying to a standing position, she can black out. This usually occurs when she first wakes up in the morning and tries to stand-up. Can also occur with changing positions likely bending over and standing upright. She states she has multiple family members with Drue Dun and given her hypermobility symptoms, she is concerned she may have it as well.    Past Medical History:  Diagnosis Date  . Anxiety 2013   treated at [facility] by Pauline Good   . Asthma 1989    no hospitalization, or intubation, previously on advair and singulair. asthma worsening.   . 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 Depression 2001   depressed since childhood   . 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 Essential hypertension 03/12/2019  . GERD (gastroesophageal reflux disease)   . History of hiatal hernia   . History of suicidal ideation   . IBS (irritable bowel syndrome)   . Kyphosis of thoracic region 2004    painful, runs in dad side   . Migraine   . Pancreatitis 2003   gallstone pancreatitis   . Pancreatitis due to biliary obstruction 2003  . PCOS (polycystic  ovarian syndrome) 2014   facial hair, irregular periods, never been pregnant   . Sleep apnea    no longer uses cpap    Past Surgical History:  Procedure Laterality Date  . BLADDER SURGERY    . CHOLECYSTECTOMY  2003   . DILATATION & CURETTAGE/HYSTEROSCOPY WITH MYOSURE N/A 12/14/2019   Procedure: DILATATION & CURETTAGE/HYSTEROSCOPY WITH Polypectomy with MYOSURE;  Surgeon: Osborne Oman, MD;  Location: [place];  Service: Gynecology;  Laterality: N/A;  . OPEN REDUCTION INTERNAL FIXATION (ORIF) PROXIMAL PHALANX Right 07/23/2013   Procedure: OPEN TREATMENT RIGHT RING FINGER, PROXIMAL INTERPHALANGEAL/JOINT FRACTURE DISLOCATION;  Surgeon: Jolyn Nap, MD;  Location: [place];  Service: Orthopedics;  Laterality: Right;  . TONSILLECTOMY     and adenoidectomy    Current Medications: Current Meds  Medication Sig  . albuterol (VENTOLIN HFA) 108 (90 Base) MCG/ACT inhaler Inhale 2 puffs into the lungs every 6 (six) hours as needed for wheezing or shortness of breath.  . busPIRone (BUSPAR) 15 MG tablet TAKE 1 TABLET (15 MG TOTAL) BY MOUTH 3 (THREE) TIMES DAILY. FOR ANXIETY  . diclofenac Sodium (VOLTAREN) 1 % GEL APPLY 2 G TOPICALLY 4 (FOUR) TIMES DAILY.  Marland Kitchen gabapentin (NEURONTIN) 300 MG capsule TAKE 1 CAPSULE (300 MG TOTAL) BY MOUTH AT BEDTIME.  . hydrOXYzine (ATARAX/VISTARIL) 25 MG tablet TAKE 1 TABLET (25 MG TOTAL) BY MOUTH 3 (THREE) TIMES DAILY AS NEEDED FOR ANXIETY.  Marland Kitchen  loperamide (IMODIUM A-D) 2 MG tablet Take 2 mg by mouth 4 (four) times daily as needed for diarrhea or loose stools.  . naproxen (NAPROSYN) 500 MG tablet TAKE 1 TABLET (500 MG TOTAL) BY MOUTH 2 (TWO) TIMES DAILY WITH A MEAL. AS NEEDED FOR PAIN  . propranolol (INDERAL) 20 MG tablet Take 1 tablet (20 mg total) by mouth 2 (two) times daily. For anxiety  . QUEtiapine (SEROQUEL) 100 MG tablet TAKE 1 TABLET (100 MG TOTAL) BY MOUTH AT BEDTIME.  Marland Kitchen sertraline (ZOLOFT) 100 MG tablet Take 1.5 tablets (150 mg total) by mouth  at bedtime.  . traZODone (DESYREL) 50 MG tablet TAKE 1 TABLET (50 MG TOTAL) BY MOUTH AT BEDTIME AS NEEDED FOR SLEEP.     Allergies:   Mucinex [guaifenesin er], Penicillins, Contrast media [iodinated diagnostic agents], Latex, Multihance [gadobenate], and Other   Social History   Socioeconomic History  . Marital status: Single    Spouse name: Not on file  . Number of children: 0  . Years of education: college   . Highest education level: Not on file  Occupational History  . Occupation: Unemployed   Tobacco Use  . Smoking status: Never Smoker  . Smokeless tobacco: Never Used  Vaping Use  . Vaping Use: Never used  Substance and Sexual Activity  . Alcohol use: No  . Drug use: No  . Sexual activity: Yes    Birth control/protection: None  Other Topics Concern  . Not on file  Social History Narrative   Lives with mom [name])    Right-handed   Caffeine: 3 cups of coffee per day   Social Determinants of Health   Financial Resource Strain: Not on file  Food Insecurity: Food Insecurity Present  . Worried About Charity fundraiser in the Last Year: Sometimes true  . Ran Out of Food in the Last Year: Sometimes true  Transportation Needs: No Transportation Needs  . Lack of Transportation (Medical): No  . Lack of Transportation (Non-Medical): No  Physical Activity: Not on file  Stress: Not on file  Social Connections: Not on file     Family History: The patient's family history includes ADD / ADHD in her cousin; Alcohol abuse in her maternal aunt, maternal grandfather, paternal aunt, and paternal grandfather; Anxiety disorder in her mother; Arnold-Chiari malformation in her mother; Arthritis in her mother; Asthma in her mother; Bipolar disorder in her father; Bone cancer (age of onset: 35) in her maternal grandfather; COPD in her father and mother; Cancer in her paternal grandmother; Congestive Heart Failure in her father; Dementia in her maternal grandmother and paternal  grandfather; Depression in her father, maternal aunt, and paternal aunt; Diabetes in her maternal grandmother and paternal grandfather; Drug abuse in her maternal grandfather and paternal grandfather; Eczema in her mother; Hypertension in her father, maternal grandmother, and mother; Multiple myeloma in her maternal grandfather; OCD in her maternal grandmother and mother; Osteoporosis in her mother; Peripheral Artery Disease in her mother; Restless legs syndrome in her mother; Scoliosis in her father; Squamous cell carcinoma in her mother.  ROS:   Please see the history of present illness.    Review of Systems  Constitutional: Positive for malaise/fatigue. Negative for chills and fever.  HENT: Negative for hearing loss.   Eyes: Negative for blurred vision.  Respiratory: Negative for shortness of breath.   Cardiovascular: Negative for chest pain, palpitations, orthopnea, claudication, leg swelling and PND.  Gastrointestinal: Positive for abdominal pain and nausea.  Genitourinary: Negative for  flank pain.  Musculoskeletal: Positive for myalgias.  Neurological: Positive for dizziness and loss of consciousness.  Endo/Heme/Allergies: Negative for polydipsia.  Psychiatric/Behavioral: Positive for depression. The patient is nervous/anxious.     EKGs/Labs/Other Studies Reviewed:    The following studies were reviewed today: No cardiac studies  EKG:  EKG is  ordered today.  The ekg ordered today demonstrates sinus tachycardia with HR 106  Recent Labs: 12/14/2019: BUN 13; Creatinine, Ser 0.91; Hemoglobin 14.0; Platelets 243; Potassium 4.2; Sodium 139  Recent Lipid Panel    Component Value Date/Time   CHOL 204 (H) 04/15/2019 0624   TRIG 146 04/15/2019 0624   HDL 41 04/15/2019 0624   CHOLHDL 5.0 04/15/2019 0624   VLDL 29 04/15/2019 0624   LDLCALC 134 (H) 04/15/2019 0624     Physical Exam:    VS:  Pulse (!) 106   Ht 5' 6.5" (1.689 m)   Wt (!) 320 lb 9.6 oz (145.4 kg)   SpO2 98%   BMI  50.97 kg/m     Wt Readings from Last 3 Encounters:  06/14/20 (!) 320 lb 9.6 oz (145.4 kg)  06/09/20 (!) 319 lb (144.7 kg)  03/24/20 (!) 317 lb (143.8 kg)     GEN:  Well nourished, well developed in no acute distress HEENT: Normal NECK: No JVD; No carotid bruits CARDIAC: RRR, no murmurs, rubs, gallops RESPIRATORY:  Clear to auscultation without rales, wheezing or rhonchi  ABDOMEN: Obese, soft, non-tender, non-distended MUSCULOSKELETAL:  No edema; No deformity  SKIN: Warm and dry NEUROLOGIC:  Alert and oriented x 3 PSYCHIATRIC:  Normal affect   Blood pressure checked manually and is 130s.   ASSESSMENT:    1. Orthostatic hypotension   2. Essential hypertension   3. Ehlers-Danlos disease   4. Morbid obesity ([place])    PLAN:    In order of problems listed above:  #Orthostatic Hypotension: Patient with classic episodes of orthostasis where she feels very lightheaded with position changes and when going from a laying/seated to standing position. No significant palpitations. Blood pressure 130s today. HR 100s.  -Recommend compression stockings -Increase hydration -Slow position changes -Can drink poweraide zero or gatorade zero to help symptoms as well -If she feels like she is going to pass out, lay down and elevate her legs to encourage venous return -Discussed weight loss at length today as will likely help symptoms  #Joint Hypermobility: Patient with strong history of joint hypermobility with multiple dislocations as well as history of orthostatic syncope. Multiple family members with Ehlers-Danlos. Has not had genetic work-up. -Check TTE -Check MRA aorta--nausea is not a true allergy and is common with gadolinum; no contraindication to imaging as discussed with radiology -Refer to genetics  #Morbid Obesity: BMI 51. Discussed diet and weight loss at length today. She is interested in being referred to weight loss clinic. -Refer to weight loss clinic -Increase exercise  with goal 119mn per week; patient likes to swim which is a great option -Will look wegovy coverage   Exercise recommendations: Goal of exercising for at least 30 minutes a day, at least 5 times per week.  Please exercise to a moderate exertion.  This means that while exercising it is difficult to speak in full sentences, however you are not so short of breath that you feel you must stop, and not so comfortable that you can carry on a full conversation.  Exertion level should be approximately a 5/10, if 10 is the most exertion you can perform.  Diet recommendations: Recommend  a heart healthy diet such as the Mediterranean diet.  This diet consists of plant based foods, healthy fats, lean meats, olive oil.  It suggests limiting the intake of simple carbohydrates such as white breads, pastries, and pastas.  It also limits the amount of red meat, wine, and dairy products such as cheese that one should consume on a daily basis.    Total time of encounter: 60 minutes total time of encounter, including 45 minutes spent in face-to-face patient care on the date of this encounter. This time includes coordination of care and counseling regarding above mentioned problem list. Remainder of non-face-to-face time involved reviewing chart documents/testing relevant to the patient encounter and documentation in the medical record. I have independently reviewed documentation from referring provider.     Medication Adjustments/Labs and Tests Ordered: Current medicines are reviewed at length with the patient today.  Concerns regarding medicines are outlined above.  Orders Placed This Encounter  Procedures  . MR Angiogram Chest W Wo Contrast  . Basic metabolic panel  . Amb Ref to Medical Weight Management  . Ambulatory referral to Genetics  . EKG 12-Lead  . ECHOCARDIOGRAM COMPLETE   No orders of the defined types were placed in this encounter.   Patient Instructions   Medication Instructions:   DR.  Johney Frame WILL FOLLOW-UP WITH YOU ABOUT [facility] COVERAGE   *If you need a refill on your cardiac medications before your next appointment, please call your pharmacy*   You have been referred to [place] have been referred to [place]   Testing/Procedures:  Your physician has requested that you have an echocardiogram. Echocardiography is a painless test that uses sound waves to create images of your heart. It provides your doctor with information about the size and shape of your heart and how well your heart's chambers and valves are working. This procedure takes approximately one hour. There are no restrictions for this procedure.   MRA OF THE CHEST WITH/WITHOUT CONTRAST   Follow-Up:  3 MONTHS IN THE OFFICE WITH SCOTT WEAVER PA-C OR ANOTHER EXTENDER   Orthostatic Hypotension Blood pressure is a measurement of how strongly, or weakly, your blood is pressing against the walls of your arteries. Orthostatic hypotension is a sudden drop in blood pressure that happens when you quickly change positions, such as when you get up from sitting or lying down. Arteries are blood vessels that carry blood from your heart throughout your body. When blood pressure is too low, you may not get enough blood to your brain or to the rest of your organs. This can cause weakness, light-headedness, rapid heartbeat, and fainting. This can last for just a few seconds or for up to a few minutes. Orthostatic hypotension is usually not a serious problem. However, if it happens frequently or gets worse, it may be a sign of something more serious. What are the causes? This condition may be caused by:  Sudden changes in posture, such as standing up quickly after you have been sitting or lying down.  Blood loss.  Loss of body fluids (dehydration).  Heart problems.  Hormone (endocrine) problems.  Pregnancy.  Severe infection.  Lack  of certain nutrients.  Severe allergic reactions (anaphylaxis).  Certain medicines, such as blood pressure medicine or medicines that make the body lose excess fluids (diuretics). Sometimes, this condition can be caused by not taking medicine as directed, such as taking too much of a certain medicine.  What increases the risk? The following factors may make you more likely to develop this condition:  Age. Risk increases as you get older.  Conditions that affect the heart or the central nervous system.  Taking certain medicines, such as blood pressure medicine or diuretics.  Being pregnant. What are the signs or symptoms? Symptoms of this condition may include:  Weakness.  Light-headedness.  Dizziness.  Blurred vision.  Fatigue.  Rapid heartbeat.  Fainting, in severe cases. How is this diagnosed? This condition is diagnosed based on:  Your medical history.  Your symptoms.  Your blood pressure measurement. Your health care provider will check your blood pressure when you are: ? Lying down. ? Sitting. ? Standing. A blood pressure reading is recorded as two numbers, such as "120 over 80" (or 120/80). The first ("top") number is called the systolic pressure. It is a measure of the pressure in your arteries as your heart beats. The second ("bottom") number is called the diastolic pressure. It is a measure of the pressure in your arteries when your heart relaxes between beats. Blood pressure is measured in a unit called mm Hg. Healthy blood pressure for most adults is 120/80. If your blood pressure is below 90/60, you may be diagnosed with hypotension. Other information or tests that may be used to diagnose orthostatic hypotension include:  Your other vital signs, such as your heart rate and temperature.  Blood tests.  Tilt table test. For this test, you will be safely secured to a table that moves you from a lying position to an upright position. Your heart rhythm and blood  pressure will be monitored during the test. How is this treated? This condition may be treated by:  Changing your diet. This may involve eating more salt (sodium) or drinking more water.  Taking medicines to raise your blood pressure.  Changing the dosage of certain medicines you are taking that might be lowering your blood pressure.  Wearing compression stockings. These stockings help to prevent blood clots and reduce swelling in your legs. In some cases, you may need to go to the hospital for:  Fluid replacement. This means you will receive fluids through an IV.  Blood replacement. This means you will receive donated blood through an IV (transfusion).  Treating an infection or heart problems, if this applies.  Monitoring. You may need to be monitored while medicines that you are taking wear off. Follow these instructions at home: Eating and drinking  Drink enough fluid to keep your urine pale yellow.  Eat a healthy diet, and follow instructions from your health care provider about eating or drinking restrictions. A healthy diet includes: ? Fresh fruits and vegetables. ? Whole grains. ? Lean meats. ? Low-fat dairy products.  Eat extra salt only as directed. Do not add extra salt to your diet unless your health care provider told you to do that.  Eat frequent, small meals.  Avoid standing up suddenly after eating.   Medicines  Take over-the-counter and prescription medicines only as told by your health care provider. ? Follow instructions from your health care provider about changing the dosage of your current medicines, if this applies. ? Do not stop or adjust any of your medicines on your own. General instructions  Wear compression stockings as told by your health care provider.  Get up slowly from lying down or sitting positions. This gives your blood pressure a chance to adjust.  Avoid hot showers and excessive heat as directed by your health care  provider.  Return to your normal activities as told by your health care provider. Ask your health care provider what activities are safe for you.  Do not use any products that contain nicotine or tobacco, such as cigarettes, e-cigarettes, and chewing tobacco. If you need help quitting, ask your health care provider.  Keep all follow-up visits as told by your health care provider. This is important.   Contact a health care provider if you:  Vomit.  Have diarrhea.  Have a fever for more than 2-3 days.  Feel more thirsty than usual.  Feel weak and tired. Get help right away if you:  Have chest pain.  Have a fast or irregular heartbeat.  Develop numbness in any part of your body.  Cannot move your arms or your legs.  Have trouble speaking.  Become sweaty or feel light-headed.  Faint.  Feel short of breath.  Have trouble staying awake.  Feel confused. Summary  Orthostatic hypotension is a sudden drop in blood pressure that happens when you quickly change positions.  Orthostatic hypotension is usually not a serious problem.  It is diagnosed by having your blood pressure taken lying down, sitting, and then standing.  It may be treated by changing your diet or adjusting your medicines. This information is not intended to replace advice given to you by your health care provider. Make sure you discuss any questions you have with your health care provider. Document Revised: 07/17/2017 Document Reviewed: 07/17/2017 Elsevier Patient Education  2021 [place].  Hypotension As your heart beats, it forces blood through your body. This force is called blood pressure. If you have hypotension, you have low blood pressure. When your blood pressure is too low, you may not get enough blood to your brain or other parts of your body. This may cause you to feel weak, light-headed, have a fast heartbeat, or even pass out (faint). Low blood pressure may be harmless, or it may cause  serious problems. What are the causes?  Blood loss.  Not enough water in the body (dehydration).  Heart problems.  Hormone problems.  Pregnancy.  A very bad infection.  Not having enough of certain nutrients.  Very bad allergic reactions.  Certain medicines. What increases the risk?  Age. The risk increases as you get older.  Conditions that affect the heart or the brain and spinal cord (central nervous system).  Taking certain medicines.  Being pregnant. What are the signs or symptoms?  Feeling: ? Weak. ? Light-headed. ? Dizzy. ? Tired (fatigued).  Blurred vision.  Fast heartbeat.  Passing out, in very bad cases. How is this treated?  Changing your diet. This may involve eating more salt (sodium) or drinking more water.  Taking medicines to raise your blood pressure.  Changing how much you take (the dosage) of some of your medicines.  Wearing compression stockings. These stockings help to prevent blood clots and reduce swelling in your legs. In some cases, you may need to go to the hospital for:  Fluid replacement. This means you will receive fluids through an IV tube.  Blood replacement. This means you will receive donated blood through an IV tube (transfusion).  Treating an infection or heart problems, if this applies.  Monitoring. You may need to be monitored while medicines that you are taking wear off. Follow these instructions at home: Eating and drinking  Drink enough fluids to keep your pee (urine) pale yellow.  Eat a healthy diet. Follow instructions from your doctor about what  you can eat or drink. A healthy diet includes: ? Fresh fruits and vegetables. ? Whole grains. ? Low-fat (lean) meats. ? Low-fat dairy products.  Eat extra salt only as told. Do not add extra salt to your diet unless your doctor tells you to.  Eat small meals often.  Avoid standing up quickly after you eat.   Medicines  Take over-the-counter and  prescription medicines only as told by your doctor. ? Follow instructions from your doctor about changing how much you take of your medicines, if this applies. ? Do not stop or change any of your medicines on your own. General instructions  Wear compression stockings as told by your doctor.  Get up slowly from lying down or sitting.  Avoid hot showers and a lot of heat as told by your doctor.  Return to your normal activities as told by your doctor. Ask what activities are safe for you.  Do not use any products that contain nicotine or tobacco, such as cigarettes, e-cigarettes, and chewing tobacco. If you need help quitting, ask your doctor.  Keep all follow-up visits as told by your doctor. This is important.   Contact a doctor if:  You throw up (vomit).  You have watery poop (diarrhea).  You have a fever for more than 2-3 days.  You feel more thirsty than normal.  You feel weak and tired. Get help right away if:  You have chest pain.  You have a fast or uneven heartbeat.  You lose feeling (have numbness) in any part of your body.  You cannot move your arms or your legs.  You have trouble talking.  You get sweaty or feel light-headed.  You pass out.  You have trouble breathing.  You have trouble staying awake.  You feel mixed up (confused). Summary  Hypotension is also called low blood pressure. It is when the force of blood pumping through your arteries is too weak.  Hypotension may be harmless, or it may cause serious problems.  Treatment may include changing your diet and medicines, and wearing compression stockings.  In very bad cases, you may need to go to the hospital. This information is not intended to replace advice given to you by your health care provider. Make sure you discuss any questions you have with your health care provider. Document Revised: 07/17/2017 Document Reviewed: 07/17/2017 Elsevier Patient Education  2021 [place].      Signed, Freada Bergeron, MD  06/14/2020 5:24 PM    [place]

## 2020-06-14 ENCOUNTER — Encounter: Payer: Self-pay | Admitting: Cardiology

## 2020-06-14 ENCOUNTER — Other Ambulatory Visit: Payer: Self-pay

## 2020-06-14 ENCOUNTER — Ambulatory Visit (INDEPENDENT_AMBULATORY_CARE_PROVIDER_SITE_OTHER): Payer: Self-pay | Admitting: Cardiology

## 2020-06-14 VITALS — HR 106 | Ht 66.5 in | Wt 320.6 lb

## 2020-06-14 DIAGNOSIS — Q796 Ehlers-Danlos syndrome, unspecified: Secondary | ICD-10-CM

## 2020-06-14 DIAGNOSIS — I10 Essential (primary) hypertension: Secondary | ICD-10-CM

## 2020-06-14 DIAGNOSIS — I951 Orthostatic hypotension: Secondary | ICD-10-CM

## 2020-06-14 NOTE — Patient Instructions (Addendum)
Medication Instructions:   DR. Shari Prows WILL FOLLOW-UP WITH YOU ABOUT [employer]   *If you need a refill on your cardiac medications before your next appointment, please call your pharmacy*   You have been referred to OUR GENETICIST DR. Sidney Ace FOR EHLERS DANLOS SYNDROME   You have been referred to [facility]   Testing/Procedures:  Your physician has requested that you have an echocardiogram. Echocardiography is a painless test that uses sound waves to create images of your heart. It provides your doctor with information about the size and shape of your heart and how well your heart's chambers and valves are working. This procedure takes approximately one hour. There are no restrictions for this procedure.   MRA OF THE CHEST WITH/WITHOUT CONTRAST   Follow-Up:  3 MONTHS IN THE OFFICE WITH SCOTT WEAVER PA-C OR ANOTHER EXTENDER   Orthostatic Hypotension Blood pressure is a measurement of how strongly, or weakly, your blood is pressing against the walls of your arteries. Orthostatic hypotension is a sudden drop in blood pressure that happens when you quickly change positions, such as when you get up from sitting or lying down. Arteries are blood vessels that carry blood from your heart throughout your body. When blood pressure is too low, you may not get enough blood to your brain or to the rest of your organs. This can cause weakness, light-headedness, rapid heartbeat, and fainting. This can last for just a few seconds or for up to a few minutes. Orthostatic hypotension is usually not a serious problem. However, if it happens frequently or gets worse, it may be a sign of something more serious. What are the causes? This condition may be caused by:  Sudden changes in posture, such as standing up quickly after you have been sitting or lying down.  Blood loss.  Loss of body fluids (dehydration).  Heart problems.  Hormone (endocrine)  problems.  Pregnancy.  Severe infection.  Lack of certain nutrients.  Severe allergic reactions (anaphylaxis).  Certain medicines, such as blood pressure medicine or medicines that make the body lose excess fluids (diuretics). Sometimes, this condition can be caused by not taking medicine as directed, such as taking too much of a certain medicine. What increases the risk? The following factors may make you more likely to develop this condition:  Age. Risk increases as you get older.  Conditions that affect the heart or the central nervous system.  Taking certain medicines, such as blood pressure medicine or diuretics.  Being pregnant. What are the signs or symptoms? Symptoms of this condition may include:  Weakness.  Light-headedness.  Dizziness.  Blurred vision.  Fatigue.  Rapid heartbeat.  Fainting, in severe cases. How is this diagnosed? This condition is diagnosed based on:  Your medical history.  Your symptoms.  Your blood pressure measurement. Your health care provider will check your blood pressure when you are: ? Lying down. ? Sitting. ? Standing. A blood pressure reading is recorded as two numbers, such as "120 over 80" (or 120/80). The first ("top") number is called the systolic pressure. It is a measure of the pressure in your arteries as your heart beats. The second ("bottom") number is called the diastolic pressure. It is a measure of the pressure in your arteries when your heart relaxes between beats. Blood pressure is measured in a unit called mm Hg. Healthy blood pressure for most adults is 120/80. If your blood pressure is below 90/60, you may be diagnosed with hypotension. Other information  or tests that may be used to diagnose orthostatic hypotension include:  Your other vital signs, such as your heart rate and temperature.  Blood tests.  Tilt table test. For this test, you will be safely secured to a table that moves you from a lying position  to an upright position. Your heart rhythm and blood pressure will be monitored during the test. How is this treated? This condition may be treated by:  Changing your diet. This may involve eating more salt (sodium) or drinking more water.  Taking medicines to raise your blood pressure.  Changing the dosage of certain medicines you are taking that might be lowering your blood pressure.  Wearing compression stockings. These stockings help to prevent blood clots and reduce swelling in your legs. In some cases, you may need to go to the hospital for:  Fluid replacement. This means you will receive fluids through an IV.  Blood replacement. This means you will receive donated blood through an IV (transfusion).  Treating an infection or heart problems, if this applies.  Monitoring. You may need to be monitored while medicines that you are taking wear off. Follow these instructions at home: Eating and drinking  Drink enough fluid to keep your urine pale yellow.  Eat a healthy diet, and follow instructions from your health care provider about eating or drinking restrictions. A healthy diet includes: ? Fresh fruits and vegetables. ? Whole grains. ? Lean meats. ? Low-fat dairy products.  Eat extra salt only as directed. Do not add extra salt to your diet unless your health care provider told you to do that.  Eat frequent, small meals.  Avoid standing up suddenly after eating.   Medicines  Take over-the-counter and prescription medicines only as told by your health care provider. ? Follow instructions from your health care provider about changing the dosage of your current medicines, if this applies. ? Do not stop or adjust any of your medicines on your own. General instructions  Wear compression stockings as told by your health care provider.  Get up slowly from lying down or sitting positions. This gives your blood pressure a chance to adjust.  Avoid hot showers and excessive  heat as directed by your health care provider.  Return to your normal activities as told by your health care provider. Ask your health care provider what activities are safe for you.  Do not use any products that contain nicotine or tobacco, such as cigarettes, e-cigarettes, and chewing tobacco. If you need help quitting, ask your health care provider.  Keep all follow-up visits as told by your health care provider. This is important.   Contact a health care provider if you:  Vomit.  Have diarrhea.  Have a fever for more than 2-3 days.  Feel more thirsty than usual.  Feel weak and tired. Get help right away if you:  Have chest pain.  Have a fast or irregular heartbeat.  Develop numbness in any part of your body.  Cannot move your arms or your legs.  Have trouble speaking.  Become sweaty or feel light-headed.  Faint.  Feel short of breath.  Have trouble staying awake.  Feel confused. Summary  Orthostatic hypotension is a sudden drop in blood pressure that happens when you quickly change positions.  Orthostatic hypotension is usually not a serious problem.  It is diagnosed by having your blood pressure taken lying down, sitting, and then standing.  It may be treated by changing your diet or adjusting your medicines.  This information is not intended to replace advice given to you by your health care provider. Make sure you discuss any questions you have with your health care provider. Document Revised: 07/17/2017 Document Reviewed: 07/17/2017 Elsevier Patient Education  2021 [employer].  Hypotension As your heart beats, it forces blood through your body. This force is called blood pressure. If you have hypotension, you have low blood pressure. When your blood pressure is too low, you may not get enough blood to your brain or other parts of your body. This may cause you to feel weak, light-headed, have a fast heartbeat, or even pass out (faint). Low blood pressure  may be harmless, or it may cause serious problems. What are the causes?  Blood loss.  Not enough water in the body (dehydration).  Heart problems.  Hormone problems.  Pregnancy.  A very bad infection.  Not having enough of certain nutrients.  Very bad allergic reactions.  Certain medicines. What increases the risk?  Age. The risk increases as you get older.  Conditions that affect the heart or the brain and spinal cord (central nervous system).  Taking certain medicines.  Being pregnant. What are the signs or symptoms?  Feeling: ? Weak. ? Light-headed. ? Dizzy. ? Tired (fatigued).  Blurred vision.  Fast heartbeat.  Passing out, in very bad cases. How is this treated?  Changing your diet. This may involve eating more salt (sodium) or drinking more water.  Taking medicines to raise your blood pressure.  Changing how much you take (the dosage) of some of your medicines.  Wearing compression stockings. These stockings help to prevent blood clots and reduce swelling in your legs. In some cases, you may need to go to the hospital for:  Fluid replacement. This means you will receive fluids through an IV tube.  Blood replacement. This means you will receive donated blood through an IV tube (transfusion).  Treating an infection or heart problems, if this applies.  Monitoring. You may need to be monitored while medicines that you are taking wear off. Follow these instructions at home: Eating and drinking  Drink enough fluids to keep your pee (urine) pale yellow.  Eat a healthy diet. Follow instructions from your doctor about what you can eat or drink. A healthy diet includes: ? Fresh fruits and vegetables. ? Whole grains. ? Low-fat (lean) meats. ? Low-fat dairy products.  Eat extra salt only as told. Do not add extra salt to your diet unless your doctor tells you to.  Eat small meals often.  Avoid standing up quickly after you eat.   Medicines  Take  over-the-counter and prescription medicines only as told by your doctor. ? Follow instructions from your doctor about changing how much you take of your medicines, if this applies. ? Do not stop or change any of your medicines on your own. General instructions  Wear compression stockings as told by your doctor.  Get up slowly from lying down or sitting.  Avoid hot showers and a lot of heat as told by your doctor.  Return to your normal activities as told by your doctor. Ask what activities are safe for you.  Do not use any products that contain nicotine or tobacco, such as cigarettes, e-cigarettes, and chewing tobacco. If you need help quitting, ask your doctor.  Keep all follow-up visits as told by your doctor. This is important.   Contact a doctor if:  You throw up (vomit).  You have watery poop (diarrhea).  You have  a fever for more than 2-3 days.  You feel more thirsty than normal.  You feel weak and tired. Get help right away if:  You have chest pain.  You have a fast or uneven heartbeat.  You lose feeling (have numbness) in any part of your body.  You cannot move your arms or your legs.  You have trouble talking.  You get sweaty or feel light-headed.  You pass out.  You have trouble breathing.  You have trouble staying awake.  You feel mixed up (confused). Summary  Hypotension is also called low blood pressure. It is when the force of blood pumping through your arteries is too weak.  Hypotension may be harmless, or it may cause serious problems.  Treatment may include changing your diet and medicines, and wearing compression stockings.  In very bad cases, you may need to go to the hospital. This information is not intended to replace advice given to you by your health care provider. Make sure you discuss any questions you have with your health care provider. Document Revised: 07/17/2017 Document Reviewed: 07/17/2017 Elsevier Patient Education  2021  [employer].

## 2020-06-16 ENCOUNTER — Telehealth: Payer: Self-pay | Admitting: *Deleted

## 2020-06-16 NOTE — Telephone Encounter (Signed)
-----   Message from Pricilla Holm sent at 06/16/2020 11:53 AM EDT ----- Regarding: RE: Needs MRA Chest w/wo contrast and referral to Dr. Jomarie Longs 06-30-20 @ 5:00 to arrive @ 4:30  pt is aware  ----- Message ----- From: Loa Socks, LPN Sent: 3/53/6144   4:43 PM EDT To: Gaye Alken, # Subject: Needs MRA Chest w/wo contrast and referral t#  This pt needs a MRA of Chest scheduled for ehlers danlos.  She is safe to proceed with getting this image done without any pre-meds, for the multihance in the gadolinium does cause nausea, but is common side effect in all pts.  She is not severely allergic to this.  This was approved by Dr. Sherrie Sport, Dr. Shari Prows, and Radiology assistance line at [facility] at [phone].  Please call and arrange.   Also she needs to be referred to Dr. Jomarie Longs for ehlers danlos syndrome.  Referral placed.  Can you please call and schedule?  Shoot me the date when done?   Thanks ladies for all you do, Lajoyce Corners

## 2020-06-19 ENCOUNTER — Other Ambulatory Visit: Payer: Self-pay

## 2020-06-19 ENCOUNTER — Encounter: Payer: Self-pay | Admitting: Physical Therapy

## 2020-06-19 ENCOUNTER — Ambulatory Visit (INDEPENDENT_AMBULATORY_CARE_PROVIDER_SITE_OTHER): Payer: Self-pay | Admitting: Physical Therapy

## 2020-06-19 DIAGNOSIS — M6281 Muscle weakness (generalized): Secondary | ICD-10-CM

## 2020-06-19 DIAGNOSIS — M546 Pain in thoracic spine: Secondary | ICD-10-CM

## 2020-06-19 DIAGNOSIS — R293 Abnormal posture: Secondary | ICD-10-CM

## 2020-06-19 NOTE — Therapy (Signed)
[place], [place], [address] Phone: [phone]   Fax:  [phone]  Physical Therapy Treatment/Recertification  Patient Details  Name: Kristen [name] MRN: [id number] Date of Birth: 03-10-1985 Referring Provider (PT): Ladell Pier, MD   Encounter Date: 06/19/2020   PT End of Session - 06/19/20 1430    Visit Number 9    Number of Visits 15    Date for PT Re-Evaluation 07/31/20    PT Start Time 4496    PT Stop Time 1425    PT Time Calculation (min) 40 min    Activity Tolerance Patient tolerated treatment well    Behavior During Therapy [facility] for tasks assessed/performed           Past Medical History:  Diagnosis Date  . Anxiety 2013   treated at [facility] by Pauline Good   . Asthma 1989    no hospitalization, or intubation, previously on advair and singulair. asthma worsening.   . 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 Depression 2001   depressed since childhood   . 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 Essential hypertension 03/12/2019  . GERD (gastroesophageal reflux disease)   . History of hiatal hernia   . History of suicidal ideation   . IBS (irritable bowel syndrome)   . Kyphosis of thoracic region 2004    painful, runs in dad side   . Migraine   . Pancreatitis 2003   gallstone pancreatitis   . Pancreatitis due to biliary obstruction 2003  . PCOS (polycystic ovarian syndrome) 2014   facial hair, irregular periods, never been pregnant   . Sleep apnea    no longer uses cpap    Past Surgical History:  Procedure Laterality Date  . BLADDER SURGERY    . CHOLECYSTECTOMY  2003   . DILATATION & CURETTAGE/HYSTEROSCOPY WITH MYOSURE N/A 12/14/2019   Procedure: DILATATION & CURETTAGE/HYSTEROSCOPY WITH Polypectomy with MYOSURE;  Surgeon: Osborne Oman, MD;  Location: [place];  Service: Gynecology;  Laterality: N/A;  . OPEN REDUCTION INTERNAL FIXATION (ORIF) PROXIMAL PHALANX Right  07/23/2013   Procedure: OPEN TREATMENT RIGHT RING FINGER, PROXIMAL INTERPHALANGEAL/JOINT FRACTURE DISLOCATION;  Surgeon: Jolyn Nap, MD;  Location: [place];  Service: Orthopedics;  Laterality: Right;  . TONSILLECTOMY     and adenoidectomy    There were no vitals filed for this visit.   Subjective Assessment - 06/19/20 1351    Subjective saw Dr. Nori Riis and cardiologist - POTS not ruled out and needs further testing, agree with EDS hypermobility type.    Pertinent History anx/dep, IBS,  PCOS, morbid obesity, Dep, RLS, social phobia, lymphocytic hypophysitis, hyperprolactinemia, severe persistent asthma, migraines, OSA CPAP    Limitations Sitting;Standing    Diagnostic tests xrays: WNL    Patient Stated Goals improve pain    Pain Score 0-No pain              [facility] PT Assessment - 06/19/20 1419      Assessment   Medical Diagnosis M54.6,G89.29 (ICD-10-CM) - Chronic midline thoracic back pain    Referring Provider (PT) Ladell Pier, MD      Observation/Other Assessments   Focus on Therapeutic Outcomes (FOTO)  56      AROM   Overall AROM Comments did not test today, will assess next visit  [place] [facility] PT Treatment/Exercise - 06/19/20 1351      Self-Care   Other Self-Care Comments  discussed [place] protocol for POTS and initiated with pt - reviewed cardio section; recommended she discuss scholarship options with [employer] for community fitness opportunity      Lumbar Exercises: Aerobic   Nustep L7 x 10 min                    PT Short Term Goals - 05/17/20 1424      PT SHORT TERM GOAL #1   Title independent with initial HEP    Status Achieved    Target Date 05/19/20             PT Long Term Goals - 06/19/20 1454      PT LONG TERM GOAL #1   Title independent with final HEP    Status On-going    Target Date 07/31/20      PT LONG TERM GOAL #2   Title FOTO score improved to 56 for improved function     Status Achieved      PT LONG TERM GOAL #3   Title report pain < 4/10 with prolonged activity for improved function and mobility    Status Achieved      PT LONG TERM GOAL #4   Title perform thoracic ROM without increase in pain for improved function    Status On-going    Target Date 07/31/20                 Plan - 06/19/20 1455    Clinical Impression Statement Pt overall has met pain goals for thoracic spine and FOTO goal met.  She was able to see Dr. 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  Will continue PT 1x/wk x 4-6 more weeks.    Personal Factors and Comorbidities Comorbidity 3+;Social Background;Past/Current Experience;Fitness;Finances    Comorbidities anx/dep, IBS,  PCOS, morbid obesity, Dep, RLS, social phobia, lymphocytic hypophysitis, hyperprolactinemia, severe persistent asthma, migraines, OSA CPAP    Examination-Activity Limitations Locomotion Level;Reach Overhead;Bend;Caring for Others;Sit;Squat;Stairs;Lift;Stand    Examination-Participation Restrictions Community Activity;Meal Prep;Laundry;Shop    Stability/Clinical Decision Making Evolving/Moderate complexity    Rehab Potential Fair    PT Frequency 1x / week    PT Duration 6 weeks    PT Treatment/Interventions ADLs/Self Care Home Management;Cryotherapy;Electrical Stimulation;Traction;Ultrasound;Moist Heat;Stair training;Gait training;Functional mobility training;Therapeutic activities;Therapeutic exercise;Balance training;Patient/family education;Neuromuscular re-education;Manual techniques;Taping;Dry needling;Aquatic Therapy;Energy conservation    PT Next Visit Plan focus on endurance/strengthening/thoracic extension/posture exercises - pt hypermobile so needs strengthening focus; review strengthening program    PT Home Exercise Plan Access Code: [email or web address]    Consulted and Agree with Plan of Care Patient            Patient will benefit from skilled therapeutic intervention in order to improve the following deficits and impairments:  Pain,Improper body mechanics,Postural dysfunction,Decreased activity tolerance,Decreased endurance,Decreased strength,Impaired flexibility  Visit Diagnosis: Pain in thoracic spine - Plan: PT plan of care cert/re-cert  Abnormal posture - Plan: PT plan of care cert/re-cert  Muscle weakness (generalized) - Plan: PT plan of care cert/re-cert     Problem List Patient Active Problem List   Diagnosis Date Noted  . 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  . POTS (postural orthostatic tachycardia syndrome) 06/09/2020  . Morbid obesity ([place]) 12/14/2019  . Endometrial polyp   . Moderate episode of recurrent major depressive disorder (  [place]) 11/18/2019  . Encounter for autism screening 11/18/2019  . Abnormal uterine bleeding (AUB) 11/08/2019  . Severe episode of recurrent major depressive disorder, with psychotic features ([place]) 09/20/2019  . PTSD (post-traumatic stress disorder) 09/20/2019  . 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 Abnormal serum thyroid stimulating hormone (TSH) level 04/23/2019  . MDD (major depressive disorder), recurrent severe, without psychosis ([place]) 04/15/2019  . OSA on CPAP 03/17/2019  . Anxiety and depression 03/12/2019  . Chronic migraine with aura 03/12/2019  . Essential hypertension 03/12/2019  . Mild intermittent asthma without complication 96/92/4932  . 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 Elevated prolactin level 07/05/2016  . Vulvar lesion 07/05/2016  . Restless legs 05/24/2016  . Seasonal allergies 05/13/2014  . Intertrigo 05/13/2014  . Decreased vision 05/13/2014  . PCOS (polycystic ovarian syndrome) 02/07/2014  . Social phobia 02/02/2007  . ADD 02/02/2007  . 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 Asthma, severe persistent 02/02/2007  . SCOLIOSIS 02/02/2007  . OTHER SPECIFIED CONGENITAL ANOMALIES OF KIDNEY 02/02/2007  .  ELECTROCARDIOGRAM, ABNORMAL 02/02/2007      Laureen Abrahams, PT, DPT 06/19/20 3:00 PM    [facility] Physical Therapy [address] [place], [place], [address] Phone: [phone]   Fax:  [phone]  Name: Kristen [name] MRN: [id number] Date of Birth: [birth year]

## 2020-06-22 ENCOUNTER — Ambulatory Visit: Payer: Self-pay | Admitting: Physical Therapy

## 2020-06-30 ENCOUNTER — Other Ambulatory Visit: Payer: Self-pay

## 2020-06-30 ENCOUNTER — Ambulatory Visit (HOSPITAL_COMMUNITY)
Admission: RE | Admit: 2020-06-30 | Discharge: 2020-06-30 | Disposition: A | Discharge status: Home / Self Care | Payer: Self-pay | Source: Ambulatory Visit | Attending: Cardiology | Admitting: Cardiology

## 2020-06-30 ENCOUNTER — Encounter (HOSPITAL_COMMUNITY): Payer: Self-pay

## 2020-06-30 DIAGNOSIS — Q796 Ehlers-Danlos syndrome, unspecified: Secondary | ICD-10-CM

## 2020-07-05 ENCOUNTER — Other Ambulatory Visit: Payer: Self-pay

## 2020-07-05 ENCOUNTER — Ambulatory Visit (INDEPENDENT_AMBULATORY_CARE_PROVIDER_SITE_OTHER): Payer: Self-pay | Admitting: Physical Therapy

## 2020-07-05 ENCOUNTER — Encounter: Payer: Self-pay | Admitting: Physical Therapy

## 2020-07-05 DIAGNOSIS — M546 Pain in thoracic spine: Secondary | ICD-10-CM

## 2020-07-05 DIAGNOSIS — R293 Abnormal posture: Secondary | ICD-10-CM

## 2020-07-05 DIAGNOSIS — M6281 Muscle weakness (generalized): Secondary | ICD-10-CM

## 2020-07-05 NOTE — Therapy (Signed)
[facility] [address] [place], [place], [address] Phone: [phone]   Fax:  [phone]  Physical Therapy Treatment  Patient Details  Name: Kristen [name] MRN: [id number] Date of Birth: [birth year] Referring Provider (PT): Marcine Matar, MD   Encounter Date: 07/05/2020   PT End of Session - 07/05/20 1426    Visit Number 10    Number of Visits 15    Date for PT Re-Evaluation 07/31/20    PT Start Time 1339    PT Stop Time 1425    PT Time Calculation (min) 46 min    Activity Tolerance Patient tolerated treatment well    Behavior During Therapy [facility] for tasks assessed/performed           Past Medical History:  Diagnosis Date  . Anxiety 2013   treated at [facility] by Deatra Robinson   . Asthma 1989    no hospitalization, or intubation, previously on advair and singulair. asthma worsening.   . 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 Depression 2001   depressed since childhood   . 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 Essential hypertension 03/12/2019  . GERD (gastroesophageal reflux disease)   . History of hiatal hernia   . History of suicidal ideation   . IBS (irritable bowel syndrome)   . Kyphosis of thoracic region 2004    painful, runs in dad side   . Migraine   . Pancreatitis 2003   gallstone pancreatitis   . Pancreatitis due to biliary obstruction 2003  . PCOS (polycystic ovarian syndrome) 2014   facial hair, irregular periods, never been pregnant   . Sleep apnea    no longer uses cpap    Past Surgical History:  Procedure Laterality Date  . BLADDER SURGERY    . CHOLECYSTECTOMY  2003   . DILATATION & CURETTAGE/HYSTEROSCOPY WITH MYOSURE N/A 12/14/2019   Procedure: DILATATION & CURETTAGE/HYSTEROSCOPY WITH Polypectomy with MYOSURE;  Surgeon: Tereso Newcomer, MD;  Location: [facility] OR;  Service: Gynecology;  Laterality: N/A;  . OPEN REDUCTION INTERNAL FIXATION (ORIF) PROXIMAL PHALANX Right 07/23/2013    Procedure: OPEN TREATMENT RIGHT RING FINGER, PROXIMAL INTERPHALANGEAL/JOINT FRACTURE DISLOCATION;  Surgeon: Jodi Marble, MD;  Location: [facility];  Service: Orthopedics;  Laterality: Right;  . TONSILLECTOMY     and adenoidectomy    There were no vitals filed for this visit.   Subjective Assessment - 07/05/20 1337    Subjective was supposed to have MRI but has to be rescheduled    Pertinent History anx/dep, IBS,  PCOS, morbid obesity, Dep, RLS, social phobia, lymphocytic hypophysitis, hyperprolactinemia, severe persistent asthma, migraines, OSA CPAP    Limitations Sitting;Standing    Diagnostic tests xrays: WNL    Patient Stated Goals improve pain    Currently in Pain? Yes    Pain Score 4     Pain Location Back    Pain Orientation Left;Lower    Pain Descriptors / Indicators --   pulling   Pain Type Acute pain;Chronic pain    Pain Radiating Towards posterior Lt knee    Pain Onset More than a month ago    Pain Frequency Intermittent    Aggravating Factors  lifting, bending, sitting at desk    Pain Relieving Factors lying flat                             [facility]  Adult PT Treatment/Exercise - 07/05/20 1345      Lumbar Exercises: Aerobic   Nustep L6 x 10 min      Lumbar Exercises: Supine   Bridge --   3x10; 10 sec hold   Other Supine Lumbar Exercises pilates hold 10 x 10 sec    Other Supine Lumbar Exercises isometric hip adduction 3x1-, 5 sec hold      Lumbar Exercises: Sidelying   Clam Both   3x10   Hip Abduction Both   3x10                   PT Short Term Goals - 05/17/20 1424      PT SHORT TERM GOAL #1   Title independent with initial HEP    Status Achieved    Target Date 05/19/20             PT Long Term Goals - 06/19/20 1454      PT LONG TERM GOAL #1   Title independent with final HEP    Status On-going    Target Date 07/31/20      PT LONG TERM GOAL #2   Title FOTO score improved to 56 for improved function     Status Achieved      PT LONG TERM GOAL #3   Title report pain < 4/10 with prolonged activity for improved function and mobility    Status Achieved      PT LONG TERM GOAL #4   Title perform thoracic ROM without increase in pain for improved function    Status On-going    Target Date 07/31/20                 Plan - 07/05/20 1426    Clinical Impression Statement Session today focused on horizontal exercises for POTS exercise program and pt tolerated well with core exercises being most challenging.  Will continue to benefit from PT to maximize function.    Personal Factors and Comorbidities Comorbidity 3+;Social Background;Past/Current Experience;Fitness;Finances    Comorbidities anx/dep, IBS,  PCOS, morbid obesity, Dep, RLS, social phobia, lymphocytic hypophysitis, hyperprolactinemia, severe persistent asthma, migraines, OSA CPAP    Examination-Activity Limitations Locomotion Level;Reach Overhead;Bend;Caring for Others;Sit;Squat;Stairs;Lift;Stand    Examination-Participation Restrictions Community Activity;Meal Prep;Laundry;Shop    Stability/Clinical Decision Making Evolving/Moderate complexity    Rehab Potential Fair    PT Frequency 1x / week    PT Duration 6 weeks    PT Treatment/Interventions ADLs/Self Care Home Management;Cryotherapy;Electrical Stimulation;Traction;Ultrasound;Moist Heat;Stair training;Gait training;Functional mobility training;Therapeutic activities;Therapeutic exercise;Balance training;Patient/family education;Neuromuscular re-education;Manual techniques;Taping;Dry needling;Aquatic Therapy;Energy conservation    PT Next Visit Plan focus on endurance/strengthening/thoracic extension/posture exercises - pt hypermobile so needs strengthening focus; review strengthening program - planks and sitting/standing exercises    PT Home Exercise Plan Access Code: [email or web address]    Consulted and Agree with Plan of Care Patient           Patient will benefit from skilled  therapeutic intervention in order to improve the following deficits and impairments:  Pain,Improper body mechanics,Postural dysfunction,Decreased activity tolerance,Decreased endurance,Decreased strength,Impaired flexibility  Visit Diagnosis: Pain in thoracic spine  Abnormal posture  Muscle weakness (generalized)     Problem List Patient Active Problem List   Diagnosis Date Noted  . 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  . POTS (postural orthostatic tachycardia syndrome) 06/09/2020  . Morbid obesity (HCC) 12/14/2019  . Endometrial polyp   . Moderate episode of recurrent major depressive disorder (HCC) 11/18/2019  . Encounter for autism screening 11/18/2019  . Abnormal uterine  bleeding (AUB) 11/08/2019  . Severe episode of recurrent major depressive disorder, 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  . PTSD (post-traumatic stress disorder) 09/20/2019  . 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 Abnormal serum thyroid stimulating hormone (TSH) level 04/23/2019  . MDD (major depressive disorder), recurrent severe, 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  . OSA on CPAP 03/17/2019  . Anxiety and depression 03/12/2019  . Chronic migraine with aura 03/12/2019  . Essential hypertension 03/12/2019  . Mild intermittent asthma without complication 03/12/2019  . 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 Elevated prolactin level 07/05/2016  . Vulvar lesion 07/05/2016  . Restless legs 05/24/2016  . Seasonal allergies 05/13/2014  . Intertrigo 05/13/2014  . Decreased vision 05/13/2014  . PCOS (polycystic ovarian syndrome) 02/07/2014  . Social phobia 02/02/2007  . ADD 02/02/2007  . 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 Asthma, severe persistent 02/02/2007  . SCOLIOSIS 02/02/2007  . OTHER SPECIFIED CONGENITAL ANOMALIES OF KIDNEY 02/02/2007  . ELECTROCARDIOGRAM, ABNORMAL 02/02/2007      Clarita Crane, PT, DPT 07/05/20 2:29 PM    [facility] Therapy [address] [place], [place], [address] Phone: [phone]   Fax:  [phone]  Name: Kristen [name] MRN: [id number] Date of Birth: 07/30/1985

## 2020-07-07 ENCOUNTER — Ambulatory Visit: Payer: Self-pay | Admitting: Physical Therapy

## 2020-07-10 ENCOUNTER — Encounter: Payer: Self-pay | Admitting: Physical Therapy

## 2020-07-10 ENCOUNTER — Telehealth: Payer: Self-pay | Admitting: *Deleted

## 2020-07-10 NOTE — Telephone Encounter (Signed)
-----   Message from Pricilla Holm sent at 07/10/2020  4:51 PM EDT ----- She is schedule for 07-15-20@ 9am @ [facility].  [place] with the scheduler to make sure they are aware that patient don't need pre meds.

## 2020-07-10 NOTE — Telephone Encounter (Signed)
Spoke with the pt and informed her that per Dr. Shari Prows, head of Radiology at [facility] and our MRA medical director, and she does NOT need pre-meds for rescheduled MRA that is now planned for 6/11. This is also noted clearly in appt notes in Epic.  For further detail about this, please refer below in reference to 5/13 telephone encounter about this:  Regarding: RE: Needs MRA Chest w/wo contrast   06-30-20 @ 5:00 to arrive @ 4:30  pt is aware  ----- Message ----- From: Loa Socks, LPN Sent: 9/60/4540   4:43 PM EDT To: Gaye Alken, # Subject: Needs MRA Chest w/wo contrast and referral t#  This pt needs a MRA of Chest scheduled for ehlers danlos.  She is safe to proceed with getting this image done without any pre-meds, for the multihance in the gadolinium does cause nausea, but is common side effect in all pts.  She is not severely allergic to this.  This was approved by Dr. Sherrie Sport, Dr. Shari Prows, and Radiology assistance line at [facility] at [phone].  Please call and arrange.

## 2020-07-11 ENCOUNTER — Ambulatory Visit: Payer: Self-pay | Attending: Internal Medicine | Admitting: Internal Medicine

## 2020-07-11 ENCOUNTER — Other Ambulatory Visit: Payer: Self-pay

## 2020-07-11 ENCOUNTER — Encounter: Payer: Self-pay | Admitting: Internal Medicine

## 2020-07-11 VITALS — BP 145/85 | HR 84 | Resp 16 | Wt 318.6 lb

## 2020-07-11 DIAGNOSIS — K219 Gastro-esophageal reflux disease without esophagitis: Secondary | ICD-10-CM

## 2020-07-11 DIAGNOSIS — K580 Irritable bowel syndrome with diarrhea: Secondary | ICD-10-CM

## 2020-07-11 DIAGNOSIS — Q796 Ehlers-Danlos syndrome, unspecified: Secondary | ICD-10-CM

## 2020-07-11 DIAGNOSIS — E221 Hyperprolactinemia: Secondary | ICD-10-CM

## 2020-07-11 DIAGNOSIS — N938 Other specified abnormal uterine and vaginal bleeding: Secondary | ICD-10-CM

## 2020-07-11 MED ORDER — OMEPRAZOLE 20 MG PO CPDR
20.0000 mg | DELAYED_RELEASE_CAPSULE | Freq: Every day | ORAL | 3 refills | Status: DC
  Filled 2020-07-11: qty 30, 30d supply, fill #0

## 2020-07-11 NOTE — Progress Notes (Signed)
Patient ID: Kristen [name], female    DOB: 19-Mar-1985  MRN: [id number]  CC:  IBS  Subjective: Kristen [name] is a 35 y.o. female who presents for IBS Her concerns today include:  Pt with hx ofanx/dep, IBS,  PCOS, morbid obesity, Dep, RLS, social phobia, lymphocytic hypophysitis, hyperprolactinemia, severe persistent asthma, migraines, OSA CPAP, RLS.  Since I last saw her, she has been diagnosed with probable Ehlers-Danlos syndrome through sports medicine clinic.  She was referred to cardiology Dr. Johney Frame for recurrent orthostatic syncope.  Patient encouraged to wear compression socks and stay hydrated.  MRA of aorta ordered.  She has an appointment coming up for genetic testing later this month.  Main complaint today is GI symptoms that she associates with her diagnosis of IBS.  She reports a lot of bloating and gassiness with meals, frequent loose stools.  She can have 3-10 loose bowel movements a day.  Certain foods make it worse like green leafy vegetables.  She sometimes notices undigested green leafy vegetables in her stool.  Started after cholecystectomy at age 42 but feels that it is getting worse with age.  She takes Imodium 1 tablet twice a day as needed but she was not sure whether she can continue to take Imodium or not.    Also notes burning in the stomach with any foods.  Was diagnosed with GERD in the past and was on Nexium.  Would like to be referred back to endocrinology for follow-up on history of hyperprolactinemia.  She was seeing Dr. Loanne Drilling but has not seen him in about 2 years.  No recent check of prolactin level.  Reports dysfunctional vaginal bleeding.  Had a uterine polyp removed in November of last year.  After that she did not have menses until mid April of this year.  She bled for about 5 weeks with heavy bleeding.  She just came off her cycle this week that only lasted 2 days.  She has done several home pregnancy tests that were negative.  Obesity: She  has been referred to medical weight management by the cardiologist.  She states that she is working on trying to get her weight down.  She is moving more and working with physical therapy.  Patient Active Problem List   Diagnosis Date Noted  . 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  . POTS (postural orthostatic tachycardia syndrome) 06/09/2020  . Morbid obesity ([place]) 12/14/2019  . Endometrial polyp   . Moderate episode of recurrent major depressive disorder ([place]) 11/18/2019  . Encounter for autism screening 11/18/2019  . Abnormal uterine bleeding (AUB) 11/08/2019  . Severe episode of recurrent major depressive disorder, with psychotic features ([place]) 09/20/2019  . PTSD (post-traumatic stress disorder) 09/20/2019  . 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 Abnormal serum thyroid stimulating hormone (TSH) level 04/23/2019  . MDD (major depressive disorder), recurrent severe, without psychosis ([place]) 04/15/2019  . OSA on CPAP 03/17/2019  . Anxiety and depression 03/12/2019  . Chronic migraine with aura 03/12/2019  . Essential hypertension 03/12/2019  . Mild intermittent asthma without complication 40/98/1191  . 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 Elevated prolactin level 07/05/2016  . Vulvar lesion 07/05/2016  . Restless legs 05/24/2016  . Seasonal allergies 05/13/2014  . Intertrigo 05/13/2014  . Decreased vision 05/13/2014  . PCOS (polycystic ovarian syndrome) 02/07/2014  . Social phobia 02/02/2007  . ADD 02/02/2007  . 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 Asthma, severe persistent 02/02/2007  . SCOLIOSIS 02/02/2007  . OTHER SPECIFIED CONGENITAL ANOMALIES OF KIDNEY  02/02/2007  . ELECTROCARDIOGRAM, ABNORMAL 02/02/2007     Current Outpatient Medications on File Prior to Visit  Medication Sig Dispense Refill  . albuterol (VENTOLIN HFA) 108 (90 Base) MCG/ACT inhaler Inhale 2 puffs into the lungs every 6 (six) hours as needed for wheezing or shortness of breath. 6.7 g 3  . busPIRone  (BUSPAR) 15 MG tablet TAKE 1 TABLET (15 MG TOTAL) BY MOUTH 3 (THREE) TIMES DAILY. FOR ANXIETY 90 tablet 2  . diclofenac Sodium (VOLTAREN) 1 % GEL APPLY 2 G TOPICALLY 4 (FOUR) TIMES DAILY. 100 g 1  . gabapentin (NEURONTIN) 300 MG capsule TAKE 1 CAPSULE (300 MG TOTAL) BY MOUTH AT BEDTIME. 30 capsule 3  . hydrOXYzine (ATARAX/VISTARIL) 25 MG tablet TAKE 1 TABLET (25 MG TOTAL) BY MOUTH 3 (THREE) TIMES DAILY AS NEEDED FOR ANXIETY. 90 tablet 2  . loperamide (IMODIUM A-D) 2 MG tablet Take 2 mg by mouth 4 (four) times daily as needed for diarrhea or loose stools.    . naproxen (NAPROSYN) 500 MG tablet TAKE 1 TABLET (500 MG TOTAL) BY MOUTH 2 (TWO) TIMES DAILY WITH A MEAL. AS NEEDED FOR PAIN 60 tablet 1  . propranolol (INDERAL) 20 MG tablet Take 1 tablet (20 mg total) by mouth 2 (two) times daily. For anxiety 60 tablet 2  . QUEtiapine (SEROQUEL) 100 MG tablet TAKE 1 TABLET (100 MG TOTAL) BY MOUTH AT BEDTIME. 30 tablet 2  . sertraline (ZOLOFT) 100 MG tablet Take 1.5 tablets (150 mg total) by mouth at bedtime. 45 tablet 2  . traZODone (DESYREL) 50 MG tablet TAKE 1 TABLET (50 MG TOTAL) BY MOUTH AT BEDTIME AS NEEDED FOR SLEEP. 30 tablet 2   No current facility-administered medications on file prior to visit.    Allergies  Allergen Reactions  . Mucinex [Guaifenesin Er] Hives, Itching and Swelling  . Penicillins Swelling    Did it involve swelling of the face/tongue/throat, SOB, or low BP? Yes Did it involve sudden or severe rash/hives, skin peeling, or any reaction on the inside of your mouth or nose? No Did you need to seek medical attention at a hospital or doctor's office? Yes When did it last happen?>10 years ago If all above answers are "NO", may proceed with cephalosporin use.   . Contrast Media [Iodinated Diagnostic Agents] Nausea And Vomiting  . Latex Hives and Swelling  . Multihance [Gadobenate] Nausea And Vomiting  . Other     Pickles - throat swelling and nausea     Social History    Socioeconomic History  . Marital status: Single    Spouse name: Not on file  . Number of children: 0  . Years of education: college   . Highest education level: Not on file  Occupational History  . Occupation: Unemployed   Tobacco Use  . Smoking status: Never Smoker  . Smokeless tobacco: Never Used  Vaping Use  . Vaping Use: Never used  Substance and Sexual Activity  . Alcohol use: No  . Drug use: No  . Sexual activity: Yes    Birth control/protection: None  Other Topics Concern  . Not on file  Social History Narrative   Lives with mom [name])    Right-handed   Caffeine: 3 cups of coffee per day   Social Determinants of Health   Financial Resource Strain: Not on file  Food Insecurity: Food Insecurity Present  . Worried About Charity fundraiser in the Last Year: Sometimes true  . Ran Out of Food in the  Last Year: Sometimes true  Transportation Needs: No Transportation Needs  . Lack of Transportation (Medical): No  . Lack of Transportation (Non-Medical): No  Physical Activity: Not on file  Stress: Not on file  Social Connections: Not on file  Intimate Partner Violence: Not on file    Family History  Problem Relation Age of Onset  . Hypertension Mother   . Arnold-Chiari malformation Mother   . Restless legs syndrome Mother   . Osteoporosis Mother   . COPD Mother   . Asthma Mother   . Squamous cell carcinoma Mother        skin   . Eczema Mother   . Arthritis Mother   . Peripheral Artery Disease Mother   . Anxiety disorder Mother   . OCD Mother   . Hypertension Father   . Scoliosis Father   . Bipolar disorder Father   . Congestive Heart Failure Father   . COPD Father   . Depression Father   . Diabetes Maternal Grandmother   . Hypertension Maternal Grandmother   . Dementia Maternal Grandmother   . OCD Maternal Grandmother   . Cancer Paternal Grandmother        colon  . Bone cancer Maternal Grandfather 82       bone marrow   . Multiple myeloma  Maternal Grandfather   . Alcohol abuse Maternal Grandfather   . Drug abuse Maternal Grandfather   . Diabetes Paternal Grandfather   . Alcohol abuse Paternal Grandfather   . Drug abuse Paternal Grandfather   . Dementia Paternal Grandfather   . Alcohol abuse Maternal Aunt   . Depression Maternal Aunt   . Alcohol abuse Paternal Aunt   . Depression Paternal Aunt   . ADD / ADHD Cousin     Past Surgical History:  Procedure Laterality Date  . BLADDER SURGERY    . CHOLECYSTECTOMY  2003   . DILATATION & CURETTAGE/HYSTEROSCOPY WITH MYOSURE N/A 12/14/2019   Procedure: DILATATION & CURETTAGE/HYSTEROSCOPY WITH Polypectomy with MYOSURE;  Surgeon: Osborne Oman, MD;  Location: [place];  Service: Gynecology;  Laterality: N/A;  . OPEN REDUCTION INTERNAL FIXATION (ORIF) PROXIMAL PHALANX Right 07/23/2013   Procedure: OPEN TREATMENT RIGHT RING FINGER, PROXIMAL INTERPHALANGEAL/JOINT FRACTURE DISLOCATION;  Surgeon: Jolyn Nap, MD;  Location: [place];  Service: Orthopedics;  Laterality: Right;  . TONSILLECTOMY     and adenoidectomy    ROS: Review of Systems Negative except as stated above  PHYSICAL EXAM: BP (!) 145/85   Pulse 84   Resp 16   Wt (!) 318 lb 9.6 oz (144.5 kg)   SpO2 99%   BMI 50.65 kg/m   Physical Exam  General appearance - alert, well appearing, obese young Caucasian female and in no distress Mental status - normal mood, behavior, speech, dress, motor activity, and thought processes Abdomen - soft, nontender, nondistended, no masses or organomegaly   CMP Latest Ref Rng & Units 12/14/2019 04/15/2019 04/15/2019  Glucose 70 - 99 mg/dL 92 - 91  BUN 6 - 20 mg/dL 13 - 14  Creatinine 0.44 - 1.00 mg/dL 0.91 - 0.86  Sodium 135 - 145 mmol/L 139 - 139  Potassium 3.5 - 5.1 mmol/L 4.2 - 3.9  Chloride 98 - 111 mmol/L 109 - 107  CO2 22 - 32 mmol/L 23 - 23  Calcium 8.9 - 10.3 mg/dL 9.0 - 9.1  Total Protein 6.5 - 8.1 g/dL - 7.0 6.7  Total Bilirubin 0.3 - 1.2 mg/dL -  0.5 0.7  Alkaline Phos 38 - 126 U/L - 111 112  AST 15 - 41 U/L - 20 20  ALT 0 - 44 U/L - 22 22   Lipid Panel     Component Value Date/Time   CHOL 204 (H) 04/15/2019 0624   TRIG 146 04/15/2019 0624   HDL 41 04/15/2019 0624   CHOLHDL 5.0 04/15/2019 0624   VLDL 29 04/15/2019 0624   LDLCALC 134 (H) 04/15/2019 0624    CBC    Component Value Date/Time   WBC 9.2 12/14/2019 1032   RBC 5.04 12/14/2019 1032   HGB 14.0 12/14/2019 1032   HGB 13.7 07/22/2017 1339   HCT 43.9 12/14/2019 1032   HCT 41.3 07/22/2017 1339   PLT 243 12/14/2019 1032   PLT 267 07/22/2017 1339   MCV 87.1 12/14/2019 1032   MCV 88 07/22/2017 1339   MCH 27.8 12/14/2019 1032   MCHC 31.9 12/14/2019 1032   RDW 13.5 12/14/2019 1032   RDW 13.9 07/22/2017 1339   LYMPHSABS 3.3 (H) 07/22/2017 1339   MONOABS 0.5 01/08/2017 0950   EOSABS 0.5 (H) 07/22/2017 1339   BASOSABS 0.0 07/22/2017 1339    ASSESSMENT AND PLAN:  1. Irritable bowel syndrome with diarrhea -Dietary recommendations given including avoiding foods that cause bloating, changing to Lactaid milk, low-FODMAP eating plan.  Printed information given. Advised okay to take Imodium 2 mg 3 times daily before meals.  If this does not help to reduce symptoms significantly, bile sequestrant can be added. - Ambulatory referral to Gastroenterology  2. Gastroesophageal reflux disease without esophagitis GERD precautions discussed including foods to avoid, importance of eating last meal at least 2 to 3 hours before laying down at night and sleeping with the head of bed elevated. - omeprazole (PRILOSEC) 20 MG capsule; Take 1 capsule (20 mg total) by mouth daily.  Dispense: 30 capsule; Refill: 3  3. Ehlers-Danlos syndrome Keep appointment for genetic testing  4. Hyperprolactinemia ([place]) We will recheck her prolactin level.  If elevated we will refer back to endocrinology. - Prolactin; Future  5. DUB (dysfunctional uterine bleeding) Likely has a component of  anovulatory cycles - CBC; Future - Ambulatory referral to Gynecology  6. Morbid obesity ([place]) Commended her on trying to move more.  She has been referred to medical weight management.   Patient was given the opportunity to ask questions.  Patient verbalized understanding of the plan and was able to repeat key elements of the plan.   No orders of the defined types were placed in this encounter.    Requested Prescriptions    No prescriptions requested or ordered in this encounter    No follow-ups on file.  Karle Plumber, MD, FACP

## 2020-07-11 NOTE — Patient Instructions (Signed)
Okay to use Imodium 2 mg 3 times a day before meals as needed to help decrease diarrhea.  Low-FODMAP Eating Plan  FODMAP stands for fermentable oligosaccharides, disaccharides, monosaccharides, and polyols. These are sugars that are hard for some people to digest. A low-FODMAP eating plan may help some people who have irritable bowel syndrome (IBS) and certain other bowel (intestinal) diseases to manage their symptoms. This meal plan can be complicated to follow. Work with a diet and nutrition specialist (dietitian) to make a low-FODMAP eating plan that is right for you. A dietitian can help make sure that you get enough nutrition from this diet. What are tips for following this plan? Reading food labels  Check labels for hidden FODMAPs such as: ? High-fructose syrup. ? Honey. ? Agave. ? Natural fruit flavors. ? Onion or garlic powder.  Choose low-FODMAP foods that contain 3-4 grams of fiber per serving.  Check food labels for serving sizes. Eat only one serving at a time to make sure FODMAP levels stay low. Shopping  Shop with a list of foods that are recommended on this diet and make a meal plan. Meal planning  Follow a low-FODMAP eating plan for up to 6 weeks, or as told by your health care provider or dietitian.  To follow the eating plan: 1. Eliminate high-FODMAP foods from your diet completely. Choose only low-FODMAP foods to eat. You will do this for 2-6 weeks. 2. Gradually reintroduce high-FODMAP foods into your diet one at a time. Most people should wait a few days before introducing the next new high-FODMAP food into their meal plan. Your dietitian can recommend how quickly you may reintroduce foods. 3. Keep a daily record of what and how much you eat and drink. Make note of any symptoms that you have after eating. 4. Review your daily record with a dietitian regularly to identify which foods you can eat and which foods you should avoid. General tips  Drink enough fluid  each day to keep your urine pale yellow.  Avoid processed foods. These often have added sugar and may be high in FODMAPs.  Avoid most dairy products, whole grains, and sweeteners.  Work with a dietitian to make sure you get enough fiber in your diet.  Avoid high FODMAP foods at meals to manage symptoms. Recommended foods Fruits Bananas, oranges, tangerines, lemons, limes, blueberries, raspberries, strawberries, grapes, cantaloupe, honeydew melon, kiwi, papaya, passion fruit, and pineapple. Limited amounts of dried cranberries, banana chips, and shredded coconut. Vegetables Eggplant, zucchini, cucumber, peppers, green beans, bean sprouts, lettuce, arugula, kale, Swiss chard, spinach, collard greens, bok choy, summer squash, potato, and tomato. Limited amounts of corn, carrot, and sweet potato. Green parts of scallions. Grains Gluten-free grains, such as rice, oats, buckwheat, quinoa, corn, polenta, and millet. Gluten-free pasta, bread, or cereal. Rice noodles. Corn tortillas. Meats and other proteins Unseasoned beef, pork, poultry, or fish. Eggs. Tomasa Blase. Tofu (firm) and tempeh. Limited amounts of nuts and seeds, such as almonds, walnuts, [place] nuts, pecans, peanuts, nut butters, pumpkin seeds, chia seeds, and sunflower seeds. Dairy Lactose-free milk, yogurt, and kefir. Lactose-free cottage cheese and ice cream. Non-dairy milks, such as almond, coconut, hemp, and rice milk. Non-dairy yogurt. Limited amounts of goat cheese, brie, mozzarella, parmesan, swiss, and other hard cheeses. Fats and oils Butter-free spreads. Vegetable oils, such as olive, canola, and sunflower oil. Seasoning and other foods Artificial sweeteners with names that do not end in "ol," such as aspartame, saccharine, and stevia. Maple syrup, white table sugar, raw sugar,  brown sugar, and molasses. Mayonnaise, soy sauce, and tamari. Fresh basil, coriander, parsley, rosemary, and thyme. Beverages Water and mineral water.  Sugar-sweetened soft drinks. Small amounts of orange juice or cranberry juice. Black and green tea. Most dry wines. Coffee. The items listed above may not be a complete list of foods and beverages you can eat. Contact a dietitian for more information. Foods to avoid Fruits Fresh, dried, and juiced forms of apple, pear, watermelon, peach, plum, cherries, apricots, blackberries, boysenberries, figs, nectarines, and mango. Avocado. Vegetables Chicory root, artichoke, asparagus, cabbage, snow peas, Brussels sprouts, broccoli, sugar snap peas, mushrooms, celery, and cauliflower. Onions, garlic, leeks, and the white part of scallions. Grains Wheat, including kamut, durum, and semolina. Barley and bulgur. Couscous. Wheat-based cereals. Wheat noodles, bread, crackers, and pastries. Meats and other proteins Fried or fatty meat. Sausage. Cashews and pistachios. Soybeans, baked beans, black beans, chickpeas, kidney beans, fava beans, navy beans, lentils, black-eyed peas, and split peas. Dairy Milk, yogurt, ice cream, and soft cheese. Cream and sour cream. Milk-based sauces. Custard. Buttermilk. Soy milk. Seasoning and other foods Any sugar-free gum or candy. Foods that contain artificial sweeteners such as sorbitol, mannitol, isomalt, or xylitol. Foods that contain honey, high-fructose corn syrup, or agave. Bouillon, vegetable stock, beef stock, and chicken stock. Garlic and onion powder. Condiments made with onion, such as hummus, chutney, pickles, relish, salad dressing, and salsa. Tomato paste. Beverages Chicory-based drinks. Coffee substitutes. Chamomile tea. Fennel tea. Sweet or fortified wines such as port or sherry. Diet soft drinks made with isomalt, mannitol, maltitol, sorbitol, or xylitol. Apple, pear, and mango juice. Juices with high-fructose corn syrup. The items listed above may not be a complete list of foods and beverages you should avoid. Contact a dietitian for more  information. Summary  FODMAP stands for fermentable oligosaccharides, disaccharides, monosaccharides, and polyols. These are sugars that are hard for some people to digest.  A low-FODMAP eating plan is a short-term diet that helps to ease symptoms of certain bowel diseases.  The eating plan usually lasts up to 6 weeks. After that, high-FODMAP foods are reintroduced gradually and one at a time. This can help you find out which foods may be causing symptoms.  A low-FODMAP eating plan can be complicated. It is best to work with a dietitian who has experience with this type of plan. This information is not intended to replace advice given to you by your health care provider. Make sure you discuss any questions you have with your health care provider. Document Revised: 06/10/2019 Document Reviewed: 06/10/2019 Elsevier Patient Education  2021 [employer].

## 2020-07-12 ENCOUNTER — Other Ambulatory Visit: Payer: Self-pay

## 2020-07-13 ENCOUNTER — Other Ambulatory Visit: Payer: Self-pay

## 2020-07-13 ENCOUNTER — Ambulatory Visit (HOSPITAL_COMMUNITY): Payer: No Payment, Other | Attending: Cardiology

## 2020-07-13 DIAGNOSIS — Q796 Ehlers-Danlos syndrome, unspecified: Secondary | ICD-10-CM | POA: Insufficient documentation

## 2020-07-13 DIAGNOSIS — I10 Essential (primary) hypertension: Secondary | ICD-10-CM | POA: Insufficient documentation

## 2020-07-13 DIAGNOSIS — I951 Orthostatic hypotension: Secondary | ICD-10-CM | POA: Insufficient documentation

## 2020-07-13 LAB — ECHOCARDIOGRAM COMPLETE
Area-P 1/2: 3.12 cm2
S' Lateral: 3.5 cm

## 2020-07-14 ENCOUNTER — Other Ambulatory Visit: Payer: Self-pay

## 2020-07-14 ENCOUNTER — Ambulatory Visit: Payer: Self-pay | Attending: Internal Medicine

## 2020-07-14 DIAGNOSIS — I10 Essential (primary) hypertension: Secondary | ICD-10-CM

## 2020-07-14 DIAGNOSIS — N938 Other specified abnormal uterine and vaginal bleeding: Secondary | ICD-10-CM

## 2020-07-14 DIAGNOSIS — Q796 Ehlers-Danlos syndrome, unspecified: Secondary | ICD-10-CM

## 2020-07-14 DIAGNOSIS — E221 Hyperprolactinemia: Secondary | ICD-10-CM

## 2020-07-14 DIAGNOSIS — I951 Orthostatic hypotension: Secondary | ICD-10-CM

## 2020-07-15 ENCOUNTER — Ambulatory Visit (HOSPITAL_COMMUNITY): Admission: RE | Admit: 2020-07-15 | Payer: Self-pay | Source: Ambulatory Visit

## 2020-07-15 LAB — BASIC METABOLIC PANEL
BUN/Creatinine Ratio: 12 (ref 9–23)
BUN: 12 mg/dL (ref 6–20)
CO2: 20 mmol/L (ref 20–29)
Calcium: 9.1 mg/dL (ref 8.7–10.2)
Chloride: 105 mmol/L (ref 96–106)
Creatinine, Ser: 0.97 mg/dL (ref 0.57–1.00)
Glucose: 81 mg/dL (ref 65–99)
Potassium: 3.9 mmol/L (ref 3.5–5.2)
Sodium: 141 mmol/L (ref 134–144)
eGFR: 79 mL/min/{1.73_m2} (ref >59)

## 2020-07-15 LAB — CBC
Hematocrit: 44.1 % (ref 34.0–46.6)
Hemoglobin: 14.5 g/dL (ref 11.1–15.9)
MCH: 28.7 pg (ref 26.6–33.0)
MCHC: 32.9 g/dL (ref 31.5–35.7)
MCV: 87 fL (ref 79–97)
Platelets: 237 10*3/uL (ref 150–450)
RBC: 5.06 x10E6/uL (ref 3.77–5.28)
RDW: 13 % (ref 11.7–15.4)
WBC: 8.9 10*3/uL (ref 3.4–10.8)

## 2020-07-15 LAB — PROLACTIN: Prolactin: 20.5 ng/mL (ref 4.8–23.3)

## 2020-07-17 ENCOUNTER — Other Ambulatory Visit: Payer: Self-pay

## 2020-07-17 ENCOUNTER — Ambulatory Visit (HOSPITAL_COMMUNITY)
Admission: RE | Admit: 2020-07-17 | Discharge: 2020-07-17 | Disposition: A | Discharge status: Home / Self Care | Payer: Self-pay | Source: Ambulatory Visit | Attending: Internal Medicine | Admitting: Internal Medicine

## 2020-07-17 ENCOUNTER — Encounter: Payer: Self-pay | Admitting: Physical Therapy

## 2020-07-17 DIAGNOSIS — Q796 Ehlers-Danlos syndrome, unspecified: Secondary | ICD-10-CM | POA: Insufficient documentation

## 2020-07-17 MED ORDER — GADOBUTROL 1 MMOL/ML IV SOLN
10.0000 mL | Freq: Once | INTRAVENOUS | Status: AC | PRN
  Administered 2020-07-17: 10 mL via INTRAVENOUS

## 2020-07-18 ENCOUNTER — Telehealth: Payer: Self-pay | Admitting: Cardiology

## 2020-07-18 ENCOUNTER — Ambulatory Visit: Payer: Self-pay | Admitting: Genetic Counselor

## 2020-07-18 NOTE — Telephone Encounter (Signed)
Pt is calling in regards to obtaining her results from 07/17/20. Please advise

## 2020-07-18 NOTE — Telephone Encounter (Signed)
RN spoke with patient to review MRA results. Patient verbalized understanding. Encouraged patient to contact the office with any questions or concerns.

## 2020-07-20 NOTE — Progress Notes (Signed)
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 She states that she has been double-jointed for years. Her mother tells me that she had nursemaid's elbow as early as age 9/1.5. Also reports an incident at age 7, when she stumbled and her father reached out to catch her resulting in her shoulder and elbow dislocating. Her mother tells me that when she began walking her feet would flip into her ankles and using a walker did not hep much either. Caily states that in 2019 her ankles sublexed while carrying a box of sharps and is worried that she may hurt herself while performing daily activities. Also states that her right hip dislocates if she crosses her knees while sitting, sprains her ankles multiple times, has hyperextensible skin, bruises easily. She reports heavy menstrual bleeding likely from PCOS. In addition, she passes out frequently when she stands up too fast or temperature changes.  Family history Fatemah (III.3) is the only child of her parents. She does not have children. Both parents (II.2, II.3) have hypermobile joints. Paternal aunt (II.1) was murdered. They state that her son (III.2) was hypermobile as was the paternal grandmother who also had hip dislocations, scoliosis, strokes and polio. Paternal grandfather (I.1) died of CHF. She also reports CHF in her father at age 5. They state that he is not in very good health. Tajuana's maternal aunt (II.4) died of pancreatic cancer. Another aunt (II.5) has mild hypermobility as does her son (III.7), age 46. Maternal grandmother  (I.4) with dementia passed away at age 65 and grandfather (I.3) succumbed to cancer at age 38.  Impression  In summary, she reports a significant history of hypermobility in both her maternal and paternal lineages. I informed them that since there are no reports yet of gene associated with hEDS, genetic testing cannot be performed. They verbalized understanding of this.    Please note that the patient has not been counseled in this visit on personal, cultural or ethical issues that he may face due to his heart condition.   Plan Obtain a formal evaluation for hEDS to rule out generalized hypermobility. The provider can use the revised International EDS Consortium guidelines [facility], 2017) to make the diagnosis of hEDS. Get the family in touch with the [employer] at [facility]. This research lab is working on identifying the genes implicated in hEDS.       Sidney Ace, Ph.D, [facility] Clinical Molecular Geneticist

## 2020-07-24 ENCOUNTER — Ambulatory Visit (INDEPENDENT_AMBULATORY_CARE_PROVIDER_SITE_OTHER): Payer: Self-pay | Admitting: Physical Therapy

## 2020-07-24 ENCOUNTER — Other Ambulatory Visit: Payer: Self-pay

## 2020-07-24 ENCOUNTER — Encounter: Payer: Self-pay | Admitting: Physical Therapy

## 2020-07-24 DIAGNOSIS — R293 Abnormal posture: Secondary | ICD-10-CM

## 2020-07-24 DIAGNOSIS — M6281 Muscle weakness (generalized): Secondary | ICD-10-CM

## 2020-07-24 DIAGNOSIS — M546 Pain in thoracic spine: Secondary | ICD-10-CM

## 2020-07-24 NOTE — Therapy (Signed)
[facility] [address] [place], [place], [address] Phone: [phone]   Fax:  [phone]  Physical Therapy Treatment/Discharge Summary  Patient Details  Name: Kristen [name] MRN: [id number] Date of Birth: [birth year] Referring Provider (PT): Ladell Pier, MD   Encounter Date: 07/24/2020   PT End of Session - 07/24/20 1412     Visit Number 11    Number of Visits 15    Date for PT Re-Evaluation 07/31/20    PT Start Time 1340    PT Stop Time 1412    PT Time Calculation (min) 32 min    Activity Tolerance Patient tolerated treatment well    Behavior During Therapy [facility] for tasks assessed/performed             Past Medical History:  Diagnosis Date   Anxiety 2013   treated at [facility] by [place]    no hospitalization, or intubation, previously on advair and singulair. asthma worsening.    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 , dx in utero    Depression 2001   depressed since childhood    Dysrhythmia 2010   PVC's   Essential hypertension 03/12/2019   GERD (gastroesophageal reflux disease)    History of hiatal hernia    History of suicidal ideation    IBS (irritable bowel syndrome)    Kyphosis of thoracic region 2004    painful, runs in dad side    Migraine    Pancreatitis 2003   gallstone pancreatitis    Pancreatitis due to biliary obstruction 2003   PCOS (polycystic ovarian syndrome) 2014   facial hair, irregular periods, never been pregnant    Sleep apnea    no longer uses cpap    Past Surgical History:  Procedure Laterality Date   BLADDER SURGERY     CHOLECYSTECTOMY  2003    DILATATION & CURETTAGE/HYSTEROSCOPY WITH MYOSURE N/A 12/14/2019   Procedure: DILATATION & CURETTAGE/HYSTEROSCOPY WITH Polypectomy with MYOSURE;  Surgeon: Osborne Oman, MD;  Location: [facility];  Service: Gynecology;  Laterality: N/A;   OPEN REDUCTION INTERNAL FIXATION (ORIF) PROXIMAL PHALANX Right 07/23/2013    Procedure: OPEN TREATMENT RIGHT RING FINGER, PROXIMAL INTERPHALANGEAL/JOINT FRACTURE DISLOCATION;  Surgeon: Jolyn Nap, MD;  Location: [facility];  Service: Orthopedics;  Laterality: Right;   TONSILLECTOMY     and adenoidectomy    There were no vitals filed for this visit.   Subjective Assessment - 07/24/20 1343     Subjective MRI negative; reports she hasn't been doing well since MRI.    Pertinent History anx/dep, IBS,  PCOS, morbid obesity, Dep, RLS, social phobia, lymphocytic hypophysitis, hyperprolactinemia, severe persistent asthma, migraines, OSA CPAP    Limitations Sitting;Standing    Diagnostic tests xrays: WNL    Patient Stated Goals improve pain    Currently in Pain? No/denies                [facility] PT Assessment - 07/24/20 1354       Assessment   Medical Diagnosis M54.6,G89.29 (ICD-10-CM) - Chronic midline thoracic back pain    Referring Provider (PT) Ladell Pier, MD      AROM   Thoracic Flexion WNL    Thoracic Extension WNL    Thoracic - Right Side Bend WNL    Thoracic - Left Side Bend WNL    Thoracic - Right Rotation WNL    Thoracic - Left Rotation WNL  [facility] Adult PT Treatment/Exercise - 07/24/20 1344       Lumbar Exercises: Aerobic   Nustep L7 x 10 min      Lumbar Exercises: Standing   Functional Squats 5 reps   10 sec     Lumbar Exercises: Quadruped   Plank high plank on knees x 37 sec, 33 sec, x 15 sec    Other Quadruped Lumbar Exercises side plank on elbow/knee 3x15 sec bil                      PT Short Term Goals - 05/17/20 1424       PT SHORT TERM GOAL #1   Title independent with initial HEP    Status Achieved    Target Date 05/19/20               PT Long Term Goals - 07/24/20 1412       PT LONG TERM GOAL #1   Title independent with final HEP    Status Achieved      PT LONG TERM GOAL #2   Title FOTO score improved to 56 for improved function     Status Achieved      PT LONG TERM GOAL #3   Title report pain < 4/10 with prolonged activity for improved function and mobility    Status Achieved      PT LONG TERM GOAL #4   Title perform thoracic ROM without increase in pain for improved function    Status Achieved                   Plan - 07/24/20 1412     Clinical Impression Statement Pt tolerated session well today and is ready for d/c.  Needs continued exercise which she has well established HEP.  Will d/c PT today.    Personal Factors and Comorbidities Comorbidity 3+;Social Background;Past/Current Experience;Fitness;Finances    Comorbidities anx/dep, IBS,  PCOS, morbid obesity, Dep, RLS, social phobia, lymphocytic hypophysitis, hyperprolactinemia, severe persistent asthma, migraines, OSA CPAP    Examination-Activity Limitations Locomotion Level;Reach Overhead;Bend;Caring for Others;Sit;Squat;Stairs;Lift;Stand    Examination-Participation Restrictions Community Activity;Meal Prep;Laundry;Shop    Stability/Clinical Decision Making Evolving/Moderate complexity    Rehab Potential Fair    PT Frequency 1x / week    PT Duration 6 weeks    PT Treatment/Interventions ADLs/Self Care Home Management;Cryotherapy;Electrical Stimulation;Traction;Ultrasound;Moist Heat;Stair training;Gait training;Functional mobility training;Therapeutic activities;Therapeutic exercise;Balance training;Patient/family education;Neuromuscular re-education;Manual techniques;Taping;Dry needling;Aquatic Therapy;Energy conservation    PT Next Visit Plan d/c PT today    PT Home Exercise Plan Access Code: [email or web address]    Consulted and Agree with Plan of Care Patient             Patient will benefit from skilled therapeutic intervention in order to improve the following deficits and impairments:  Pain, Improper body mechanics, Postural dysfunction, Decreased activity tolerance, Decreased endurance, Decreased strength, Impaired flexibility  Visit  Diagnosis: Pain in thoracic spine  Abnormal posture  Muscle weakness (generalized)     Problem List Patient Active Problem List   Diagnosis Date Noted   Gastroesophageal reflux disease without esophagitis 07/11/2020   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   POTS (postural orthostatic tachycardia syndrome) 06/09/2020   Morbid obesity ([place]) 12/14/2019   Endometrial polyp    Moderate episode of recurrent major depressive disorder ([place]) 11/18/2019   Encounter for autism screening 11/18/2019   Abnormal uterine bleeding (AUB) 11/08/2019   Severe episode of recurrent major depressive disorder, with psychotic features ([place]) 09/20/2019  PTSD (post-traumatic stress disorder) 09/20/2019   Irritable bowel syndrome with diarrhea 04/23/2019   Abnormal serum thyroid stimulating hormone (TSH) level 04/23/2019   MDD (major depressive disorder), recurrent severe, without psychosis ([place]) 04/15/2019   OSA on CPAP 03/17/2019   Anxiety and depression 03/12/2019   Chronic migraine with aura 03/12/2019   Essential hypertension 03/12/2019   Mild intermittent asthma without complication 08/81/1031   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 [place], [place], [address] Phone: [phone]   Fax:  [phone]  Name: Kristen [name] MRN: [id number] Date of Birth: 01/07/1986     PHYSICAL THERAPY DISCHARGE SUMMARY  Visits  from Start of Care: 11  Current functional level related to goals / functional outcomes: See above   Remaining deficits: See above   Education / Equipment: HEP   Patient agrees to discharge. Patient goals were met. Patient is being discharged due to meeting the stated rehab goals.  Laureen Abrahams, PT, DPT 07/24/20 2:14 PM  [facility] [address] [place], [place], [address] Phone: [phone]   Fax:  [phone]

## 2020-08-01 ENCOUNTER — Telehealth: Payer: Self-pay

## 2020-08-01 NOTE — Telephone Encounter (Signed)
Pt's mother called front office and requested phone call from nurse. Called pt back, mother answers and gives phone to patient. Pt reports ongoing abnormal bleeding. History of PCOS and D&C 12/14/19. Pt reports first normal menstrual period following surgery was 3/15-3/20. Next period was 4/15-5/17, normal amount of bleeding. Pt reports bleeding in June has been abnormally heavy, see MyChart message sent 07/13/20. I offered follow up appt with Anyanwu, MD on 08/17/20, pt would like to be seen sooner if possible. Request sent to front office to call patient with first available appt. Pt reports she is not saturating a full pad each hour, but that the bleeding is impacting her ability to complete daily activities.

## 2020-08-14 ENCOUNTER — Other Ambulatory Visit: Payer: Self-pay

## 2020-08-14 ENCOUNTER — Encounter (HOSPITAL_COMMUNITY): Payer: Self-pay | Admitting: Psychiatry

## 2020-08-14 ENCOUNTER — Telehealth (INDEPENDENT_AMBULATORY_CARE_PROVIDER_SITE_OTHER): Payer: No Payment, Other | Admitting: Psychiatry

## 2020-08-14 DIAGNOSIS — F331 Major depressive disorder, recurrent, moderate: Secondary | ICD-10-CM

## 2020-08-14 DIAGNOSIS — F4010 Social phobia, unspecified: Secondary | ICD-10-CM

## 2020-08-14 DIAGNOSIS — G2581 Restless legs syndrome: Secondary | ICD-10-CM | POA: Diagnosis not present

## 2020-08-14 MED ORDER — HYDROXYZINE HCL 25 MG PO TABS
ORAL_TABLET | Freq: Three times a day (TID) | ORAL | 2 refills | Status: DC | PRN
  Filled 2020-08-14: qty 90, 30d supply, fill #0

## 2020-08-14 MED ORDER — QUETIAPINE FUMARATE 100 MG PO TABS
150.0000 mg | ORAL_TABLET | Freq: Every day | ORAL | 2 refills | Status: DC
  Filled 2020-08-14: qty 45, 30d supply, fill #0

## 2020-08-14 MED ORDER — SERTRALINE HCL 100 MG PO TABS
150.0000 mg | ORAL_TABLET | Freq: Every day | ORAL | 2 refills | Status: DC
  Filled 2020-08-14: qty 45, 30d supply, fill #0

## 2020-08-14 MED ORDER — BUSPIRONE HCL 15 MG PO TABS
ORAL_TABLET | ORAL | 2 refills | Status: DC
  Filled 2020-08-14: qty 90, 30d supply, fill #0

## 2020-08-14 MED ORDER — TRAZODONE HCL 50 MG PO TABS
ORAL_TABLET | Freq: Every evening | ORAL | 2 refills | Status: DC | PRN
Start: 2020-08-14 — End: 2020-11-14
  Filled 2020-08-14: qty 30, 30d supply, fill #0

## 2020-08-14 NOTE — Progress Notes (Signed)
[place] MD/PA/NP OP Progress Note Virtual Visit via Video Note  I connected with Kristen [name] on 08/14/20 at  1:00 PM EDT by a video enabled telemedicine application and verified that I am speaking with the correct person using two identifiers.  Location: Patient: Home Provider: Clinic   I discussed the limitations of evaluation and management by telemedicine and the availability of in person appointments. The patient expressed understanding and agreed to proceed.  I provided 30 minutes of non-face-to-face time during this encounter.    08/14/2020 1:26 PM Kristen [name]  MRN:  [id number]  Chief Complaint: "I have a lot going on"  HPI:  35 year old female seen today for follow up psychiatric evaluation.  She has a psychiatric history of social phobia, ADD, anxiety, depression, and PTSD.  She is currently being managed on Zoloft 150 mg daily, trazodone 150 mg at bedtime, BuSpar 15 mg 3 times daily, hydroxyzine 25 mg 3 times a day as needed, gabapentin 300 mg nightly (prescribed by PCP), and propanolol 20 mg 2 times daily (recives from PCP).  She notes that her medications are effective in managing her psychiatric conditions.    Today she is well-groomed, pleasant, cooperative, engaged in conversation, and maintained eye contact.  She informed provider that she under a lot of stress.  She notes that she is worried about getting approved for her disability.  She notes that she continues to care for her father who has dementia and her mother who has several comorbidities.  She informed Probation officer that recently her father and her mother has been falling more often.  She notes that she isolates at home and is not social because she has to care for them.  She informed Probation officer that when she goes out with her family members her father at times blurts out racist terms.  She notes that this is unlike him however since his dementia has progressed his behaviors has worsened.  Provider was empathetic  towards patient.     Patient informed writer that the above stressors exacerbate her anxiety and depression.  Today provider conducted a GAD-7 and patient scored a 15, at her last visit she scored 14.  Provider also conducted a PHQ-9 and patient scored a 21, at her last visit she scored 15.  She endorses passive SI however notes that she would not harm himself. Today she denies SI/HI/AVH, mania or paranoia.  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  She notes that she has pain in her neck back, ankle joints.  She notes gabapentin is somewhat effective in managing her psychiatric conditions.  No medication changes made today.  Patient referred to outpatient counseling for therapy.  No other concerns noted at this time.   Visit Diagnosis:    ICD-10-CM   1. Social phobia  F40.10 busPIRone (BUSPAR) 15 MG tablet    hydrOXYzine (ATARAX/VISTARIL) 25 MG tablet    sertraline (ZOLOFT) 100 MG tablet    2. Restless legs  G25.81     3. Moderate episode of recurrent major depressive disorder (HCC)  F33.1 QUEtiapine (SEROQUEL) 100 MG tablet    sertraline (ZOLOFT) 100 MG tablet    traZODone (DESYREL) 50 MG tablet      Past Psychiatric History: social phobia, ADD, anxiety, depression, and PTSD.  Past Medical History:  Past Medical History:  Diagnosis Date   Anxiety 2013   treated at [facility] by [place]    no hospitalization, or intubation,  previously on advair and singulair. asthma worsening.    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 , dx in utero    Depression 2001   depressed since childhood    Dysrhythmia 2010   PVC's   Essential hypertension 03/12/2019   GERD (gastroesophageal reflux disease)    History of hiatal hernia    History of suicidal ideation    IBS (irritable bowel syndrome)    Kyphosis of thoracic region 2004    painful, runs in dad side    Migraine    Pancreatitis 2003   gallstone pancreatitis    Pancreatitis due to biliary  obstruction 2003   PCOS (polycystic ovarian syndrome) 2014   facial hair, irregular periods, never been pregnant    Sleep apnea    no longer uses cpap    Past Surgical History:  Procedure Laterality Date   BLADDER SURGERY     CHOLECYSTECTOMY  2003    DILATATION & CURETTAGE/HYSTEROSCOPY WITH MYOSURE N/A 12/14/2019   Procedure: DILATATION & CURETTAGE/HYSTEROSCOPY WITH Polypectomy with MYOSURE;  Surgeon: Osborne Oman, MD;  Location: [place];  Service: Gynecology;  Laterality: N/A;   OPEN REDUCTION INTERNAL FIXATION (ORIF) PROXIMAL PHALANX Right 07/23/2013   Procedure: OPEN TREATMENT RIGHT RING FINGER, PROXIMAL INTERPHALANGEAL/JOINT FRACTURE DISLOCATION;  Surgeon: Jolyn Nap, MD;  Location: [place];  Service: Orthopedics;  Laterality: Right;   TONSILLECTOMY     and adenoidectomy    Family Psychiatric History: Mother PTSD, Father Bipolar disorder and PTSD  Family History:  Family History  Problem Relation Age of Onset   Hypertension Mother    Arnold-Chiari malformation Mother    Restless legs syndrome Mother    Osteoporosis Mother    COPD Mother    Asthma Mother    Squamous cell carcinoma Mother        skin    Eczema Mother    Arthritis Mother    Peripheral Artery Disease Mother    Anxiety disorder Mother    OCD Mother    Hypertension Father    Scoliosis Father    Bipolar disorder Father    Congestive Heart Failure Father    COPD Father    Depression Father    Diabetes Maternal Grandmother    Hypertension Maternal Grandmother    Dementia Maternal Grandmother    OCD Maternal Grandmother    Cancer Paternal Grandmother        colon   Bone cancer Maternal Grandfather 82       bone marrow    Multiple myeloma Maternal Grandfather    Alcohol abuse Maternal Grandfather    Drug abuse Maternal Grandfather    Diabetes Paternal Grandfather    Alcohol abuse Paternal Grandfather    Drug abuse Paternal Grandfather    Dementia Paternal Grandfather     Alcohol abuse Maternal Aunt    Depression Maternal Aunt    Alcohol abuse Paternal Aunt    Depression Paternal Aunt    ADD / ADHD Cousin     Social History:  Social History   Socioeconomic History   Marital status: Single    Spouse name: Not on file   Number of children: 0   Years of education: college    Highest education level: Not on file  Occupational History   Occupation: Unemployed   Tobacco Use   Smoking status: Never   Smokeless tobacco: Never  Vaping Use   Vaping Use: Never used  Substance and Sexual Activity   Alcohol use:  No   Drug use: No   Sexual activity: Yes    Birth control/protection: None  Other Topics Concern   Not on file  Social History Narrative   Lives with mom [name])    Right-handed   Caffeine: 3 cups of coffee per day   Social Determinants of Health   Financial Resource Strain: Not on file  Food Insecurity: Food Insecurity Present   Worried About Charity fundraiser in the Last Year: Sometimes true   Ran Out of Food in the Last Year: Sometimes true  Transportation Needs: No Transportation Needs   Lack of Transportation (Medical): No   Lack of Transportation (Non-Medical): No  Physical Activity: Not on file  Stress: Not on file  Social Connections: Not on file    Allergies:  Allergies  Allergen Reactions   Mucinex [Guaifenesin Er] Hives, Itching and Swelling   Penicillins Swelling    Did it involve swelling of the face/tongue/throat, SOB, or low BP? Yes Did it involve sudden or severe rash/hives, skin peeling, or any reaction on the inside of your mouth or nose? No Did you need to seek medical attention at a hospital or doctor's office? Yes When did it last happen?      >10 years ago If all above answers are "NO", may proceed with cephalosporin use.    Contrast Media [Iodinated Diagnostic Agents] Nausea And Vomiting   Latex Hives and Swelling   Multihance [Gadobenate] Nausea And Vomiting   Other     Pickles - throat swelling and  nausea     Metabolic Disorder Labs: Lab Results  Component Value Date   HGBA1C 4.9 04/15/2019   MPG 93.93 04/15/2019   Lab Results  Component Value Date   PROLACTIN 20.5 07/14/2020   PROLACTIN 15.9 09/18/2016   Lab Results  Component Value Date   CHOL 204 (H) 04/15/2019   TRIG 146 04/15/2019   HDL 41 04/15/2019   CHOLHDL 5.0 04/15/2019   VLDL 29 04/15/2019   LDLCALC 134 (H) 04/15/2019   Lab Results  Component Value Date   TSH 4.628 (H) 04/15/2019   TSH 2.140 07/22/2017    Therapeutic Level Labs: No results found for: LITHIUM No results found for: VALPROATE No components found for:  CBMZ  Current Medications: Current Outpatient Medications  Medication Sig Dispense Refill   albuterol (VENTOLIN HFA) 108 (90 Base) MCG/ACT inhaler Inhale 2 puffs into the lungs every 6 (six) hours as needed for wheezing or shortness of breath. 6.7 g 3   busPIRone (BUSPAR) 15 MG tablet TAKE 1 TABLET (15 MG TOTAL) BY MOUTH 3 (THREE) TIMES DAILY. FOR ANXIETY 90 tablet 2   diclofenac Sodium (VOLTAREN) 1 % GEL APPLY 2 G TOPICALLY 4 (FOUR) TIMES DAILY. 100 g 1   gabapentin (NEURONTIN) 300 MG capsule TAKE 1 CAPSULE (300 MG TOTAL) BY MOUTH AT BEDTIME. 30 capsule 3   hydrOXYzine (ATARAX/VISTARIL) 25 MG tablet TAKE 1 TABLET (25 MG TOTAL) BY MOUTH 3 (THREE) TIMES DAILY AS NEEDED FOR ANXIETY. 90 tablet 2   loperamide (IMODIUM A-D) 2 MG tablet Take 2 mg by mouth 4 (four) times daily as needed for diarrhea or loose stools.     naproxen (NAPROSYN) 500 MG tablet TAKE 1 TABLET (500 MG TOTAL) BY MOUTH 2 (TWO) TIMES DAILY WITH A MEAL. AS NEEDED FOR PAIN 60 tablet 1   omeprazole (PRILOSEC) 20 MG capsule Take 1 capsule (20 mg total) by mouth daily. 30 capsule 3   propranolol (INDERAL) 20 MG tablet  Take 1 tablet (20 mg total) by mouth 2 (two) times daily. For anxiety 60 tablet 2   QUEtiapine (SEROQUEL) 100 MG tablet Take 1.5 tablets (150 mg total) by mouth at bedtime. 45 tablet 2   sertraline (ZOLOFT) 100 MG  tablet Take 1.5 tablets (150 mg total) by mouth at bedtime. 45 tablet 2   traZODone (DESYREL) 50 MG tablet TAKE 1 TABLET (50 MG TOTAL) BY MOUTH AT BEDTIME AS NEEDED FOR SLEEP. 30 tablet 2   No current facility-administered medications for this visit.     Musculoskeletal: Strength & Muscle Tone:  Unable to assess due to telehealth visit [place]:  Unable to assess due to telehealth visit Patient leans: N/A  Psychiatric Specialty Exam: Review of Systems  There were no vitals taken for this visit.There is no height or weight on file to calculate BMI.  General Appearance: Well Groomed  Eye Contact:  Good  Speech:  Clear and Coherent and Normal Rate  Volume:  Normal  Mood:  Anxious and Depressed  Affect:  Congruent  Thought Process:  Coherent, Goal Directed and Linear  Orientation:  Full (Time, Place, and Person)  Thought Content: WDL and Logical   Suicidal Thoughts:  No  Homicidal Thoughts:  No  Memory:  Immediate;   Good Recent;   Good Remote;   Good  Judgement:  Good  Insight:  Good  Psychomotor Activity:  Normal  Concentration:  Concentration: Good and Attention Span: Good  Recall:  Good  Fund of Knowledge: Good  Language: Good  Akathisia:  No  Handed:  Right  AIMS (if indicated): Not done  Assets:  Communication Skills Desire for Improvement Financial Resources/Insurance Housing Social Support  ADL's:  Intact  Cognition: WNL  Sleep:  Fair   Screenings: [place] Admission (Discharged) from OP Visit from 04/14/2019 in [place] [facility]  AIMS Total Score 0      AUDIT    Flowsheet Row Admission (Discharged) from OP Visit from 04/14/2019 in [place] [facility]  Alcohol Use Disorder Identification Test Final Score (AUDIT) 0      GAD-7    Flowsheet Row Video Visit from 08/14/2020 in [facility] Office Visit from 07/11/2020 in [place] Video Visit from 05/15/2020 in [facility] Office Visit from 03/24/2020 in [place] Video Visit from 02/15/2020 in [facility]  Total GAD-7 Score _0 PHQ2-9    Flowsheet Row Video Visit from 08/14/2020 in [facility] Office Visit from 07/11/2020 in [place] Video Visit from 05/15/2020 in [facility] Office Visit from 03/24/2020 in [place] Video Visit from 02/15/2020 in [facility]  PHQ-2 Total Score _1 PHQ-9 Total Score _2 Flowsheet Row Video Visit from 08/14/2020 in [facility] Video Visit from 11/18/2019 in [facility] Admission (Discharged) from OP Visit from 04/14/2019 in [place] [facility]  C-SSRS RISK CATEGORY Error: Q7 should not be populated when Q6 is No Low Risk Moderate Risk        Assessment and Plan: Patient notes that she has feelings depressed and anxiety due to life  stressors.  This time she does not want repeat just her medication.  She will follow-up with outpatient counseling for therapy.  No other concerns noted at this time.    1. Social phobia  [place]- busPIRone (BUSPAR) 15 MG tablet; TAKE 1 TABLET (15 MG TOTAL) BY MOUTH 3 (THREE) TIMES DAILY. FOR ANXIETY  Dispense: 90 tablet; Refill: 2 Continue- sertraline (ZOLOFT) 100 MG tablet; Take 1.5 tablets (150 mg total) by mouth at bedtime.  Dispense: 45 tablet; Refill: 2 Continue- hydrOXYzine (ATARAX/VISTARIL) 25 MG tablet; TAKE 1 TABLET (25 MG TOTAL) BY MOUTH 3 (THREE) TIMES DAILY AS NEEDED FOR ANXIETY.  Dispense: 90 tablet; Refill: 2  2. Moderate episode of recurrent major depressive disorder (HCC)  Conitnue- QUEtiapine (SEROQUEL) 100 MG tablet; TAKE 1  TABLET (100 MG TOTAL) BY MOUTH AT BEDTIME.  Dispense: 30 tablet; Refill: 2 Continue- sertraline (ZOLOFT) 100 MG tablet; Take 1.5 tablets (150 mg total) by mouth at bedtime.  Dispense: 45 tablet; Refill: 2 Conitnue- traZODone (DESYREL) 50 MG tablet; TAKE 1 TABLET (50 MG TOTAL) BY MOUTH AT BEDTIME AS NEEDED FOR SLEEP.  Dispense: 30 tablet; Refill: 2    Follow-up in 3 months Follow-up therapy Salley Slaughter, NP 08/14/2020, 1:26 PM

## 2020-08-15 ENCOUNTER — Encounter: Payer: Self-pay | Admitting: Genetic Counselor

## 2020-08-17 ENCOUNTER — Other Ambulatory Visit: Payer: Self-pay

## 2020-08-17 ENCOUNTER — Encounter: Payer: Self-pay | Admitting: Obstetrics & Gynecology

## 2020-08-17 ENCOUNTER — Ambulatory Visit (INDEPENDENT_AMBULATORY_CARE_PROVIDER_SITE_OTHER): Payer: Self-pay | Admitting: Obstetrics & Gynecology

## 2020-08-17 VITALS — BP 124/91 | HR 71 | Ht 66.0 in | Wt 321.0 lb

## 2020-08-17 DIAGNOSIS — N939 Abnormal uterine and vaginal bleeding, unspecified: Secondary | ICD-10-CM

## 2020-08-17 DIAGNOSIS — F419 Anxiety disorder, unspecified: Secondary | ICD-10-CM

## 2020-08-17 DIAGNOSIS — Z5941 Food insecurity: Secondary | ICD-10-CM

## 2020-08-17 MED ORDER — LORAZEPAM 1 MG PO TABS
1.0000 mg | ORAL_TABLET | Freq: Three times a day (TID) | ORAL | 0 refills | Status: DC | PRN
  Filled 2020-08-17: qty 5, 2d supply, fill #0

## 2020-08-17 MED ORDER — LORAZEPAM 1 MG PO TABS: 1.0000 mg | ORAL_TABLET | Freq: Three times a day (TID) | ORAL | 0 refills | Status: DC | PRN

## 2020-08-17 MED ORDER — MEGESTROL ACETATE 40 MG PO TABS
80.0000 mg | ORAL_TABLET | Freq: Every day | ORAL | 5 refills | Status: DC
  Filled 2020-08-17: qty 60, 15d supply, fill #0

## 2020-08-17 NOTE — Progress Notes (Signed)
GYNECOLOGY OFFICE VISIT NOTE  History:   Kristen [name] is a 35 y.o. G0P0 here today for continued AUB after Hysteroscopy, D&C, polypectomy in 12/2019.  She was offered progestin IUD placement in OR, but declined this despite being told that risk of recurrence was high without this intervention.  She reports having minimal bleeding until March 2022, but since then had significant irregular bleeding. Now wants to discuss progestin IUD placement. Accompanied by her mother.  She denies any pelvic pain or other concerns.  She is very anxious about IUD placement, especially about tenaculum placement.    Past Medical History:  Diagnosis Date   Anxiety 2013   treated at [facility] by Deatra Robinson    Asthma 1989    no hospitalization, or intubation, previously on advair and singulair. asthma worsening.    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 , dx in utero    Depression 2001   depressed since childhood    Dysrhythmia 2010   PVC's   Essential hypertension 03/12/2019   GERD (gastroesophageal reflux disease)    History of hiatal hernia    History of suicidal ideation    IBS (irritable bowel syndrome)    Kyphosis of thoracic region 2004    painful, runs in dad side    Migraine    Pancreatitis 2003   gallstone pancreatitis    Pancreatitis due to biliary obstruction 2003   PCOS (polycystic ovarian syndrome) 2014   facial hair, irregular periods, never been pregnant    Sleep apnea    no longer uses cpap    Past Surgical History:  Procedure Laterality Date   BLADDER SURGERY     CHOLECYSTECTOMY  2003    DILATATION & CURETTAGE/HYSTEROSCOPY WITH MYOSURE N/A 12/14/2019   Procedure: DILATATION & CURETTAGE/HYSTEROSCOPY WITH Polypectomy with MYOSURE;  Surgeon: Tereso Newcomer, MD;  Location: [facility] OR;  Service: Gynecology;  Laterality: N/A;   OPEN REDUCTION INTERNAL FIXATION (ORIF) PROXIMAL PHALANX Right 07/23/2013   Procedure: OPEN TREATMENT RIGHT RING FINGER, PROXIMAL  INTERPHALANGEAL/JOINT FRACTURE DISLOCATION;  Surgeon: Jodi Marble, MD;  Location: [facility];  Service: Orthopedics;  Laterality: Right;   TONSILLECTOMY     and adenoidectomy    The following portions of the patient's history were reviewed and updated as appropriate: allergies, current medications, past family history, past medical history, past social history, past surgical history and problem list.   Health Maintenance:  Normal pap and negative HRHPV on 03/10/2019.  Review of Systems:  Pertinent items noted in HPI and remainder of comprehensive ROS otherwise negative.  Physical Exam:  BP (!) 124/91   Pulse 71   Ht 5\' 6"  (1.676 m)   Wt (!) 321 lb (145.6 kg)   LMP 08/04/2020   BMI 51.81 kg/m  CONSTITUTIONAL: Well-developed, well-nourished female in no acute distress.  HEENT:  Normocephalic, atraumatic. External right and left ear normal. No scleral icterus.  NECK: Normal range of motion, supple, no masses noted on observation SKIN: No rash noted. Not diaphoretic. No erythema. No pallor. MUSCULOSKELETAL: Normal range of motion. No edema noted. NEUROLOGIC: Alert and oriented to person, place, and time. Normal muscle tone coordination. No cranial nerve deficit noted. PSYCHIATRIC: Normal mood and affect. Normal behavior. Normal judgment and thought content. CARDIOVASCULAR: Normal heart rate noted RESPIRATORY: Effort and breath sounds normal, no problems with respiration noted ABDOMEN: No masses noted. No other overt distention noted.   PELVIC: Deferred  Labs and Imaging No results found for this or  any previous visit (from the past 168 hour(s)). No results found.    Assessment and Plan:    1. Food insecurity - AMBULATORY REFERRAL TO [facility]  2. Anxiety due to invasive procedure Ativan prescribed to use prior to IUD insertion. - LORazepam (ATIVAN) 1 MG tablet; Take 1 tablet (1 mg total) by mouth every 8 (eight) hours as needed for anxiety.  Dispense: 5  tablet; Refill: 0  3. Abnormal uterine bleeding (AUB) Counseled about IUD insertion. Will premedicate with Ativan and Ibuprofen.  Hurricaine spray will be applied on the anterior lip of the cervix before tenaculum placement.  Discussed expectations with the progestin IUD in place, risks/benefits discussed. All questions answered. She will apply for [facility] foundation free Mirena, will return for placement. In the meantime, will use Megace, cautioned about appetite stimulation effect. - megestrol (MEGACE) 40 MG tablet; Take 2 tablets (80 mg total) by mouth daily. Can increase to two tablets twice a day in the event of heavy bleeding  Dispense: 60 tablet; Refill: 5  Routine preventative health maintenance measures emphasized. Please refer to After Visit Summary for other counseling recommendations.   Return in about 2 weeks (around 08/31/2020) for Progestin IUD insertion for AUB.    I spent 20 minutes dedicated to the care of this patient including pre-visit review of records, face to face time with the patient discussing her conditions and treatments and post visit ordering of testing.    Jaynie Collins, MD, FACOG Obstetrician & Gynecologist, [facility] for [employer], [facility]

## 2020-08-17 NOTE — Progress Notes (Signed)
Patient has elevated phq9 & gad7 today- has someone she sees for mental health services currently.

## 2020-08-17 NOTE — Patient Instructions (Signed)
Intrauterine Device Insertion An intrauterine device (IUD) is a medical device that is inserted into the uterus to prevent pregnancy. It is a small, T-shaped device that has one or two nylon strings hanging down from it. The strings hang out of the lower part of the uterus (cervix) to allow for future IUD removal. There are two types of IUDs: Hormone IUD. This type of IUD is made of plastic and contains the hormone progestin (synthetic progesterone). A hormone IUD may last 3-5 years, depending on which one you have. Synthetic progesterone prevents pregnancy by: Thickening cervical mucus to prevent sperm from entering the uterus. Thinning the uterine lining to prevent a fertilized egg from implanting there. Copper IUD. This type of IUD has copper wire wrapped around it. A copper IUD may last up to 10 years. Copper prevents pregnancy by making the uterus and fallopian tubes produce a fluid that kills sperm. Tell a health care provider about: Any allergies you have. All medicines you are taking, including vitamins, herbs, eye drops, creams, and over-the-counter medicines. Any surgeries you have had. Any medical conditions you have, including any sexually transmitted infections (STIs) you may have. Whether you are pregnant or may be pregnant. What are the risks? Generally, this is a safe procedure. However, problems may occur, including: Infection. Bleeding. Allergic reactions to medicines. Puncture (perforation) of the uterus or damage to other structures or organs. Accidental placement of the IUD either in the muscle layer of the uterus (myometrium) or outside the uterus. The IUD falling out of the uterus (expulsion). This is more common among women who have recently had a child. Higher risk of an egg being fertilized outside your uterus (ectopic pregnancy).This is rare. Pelvic inflammatory disease (PID), which is an infection in the uterus and fallopian tubes. The IUD does not cause the  infection. The infection is usually from an unknown sexually transmitted infection (STI). This is rare, and it usually happens during the first 20 days after the IUD is inserted. What happens before the procedure? Ask your health care provider about: Changing or stopping your regular medicines. This is especially important if you are taking diabetes medicines or blood thinners. Taking over-the-counter medicines, vitamins, herbs, and supplements. Talk with your health care provider about when to schedule your IUD placement. Your health care provider may recommend taking over-the-counter pain medicines before the procedure. These medicines include ibuprofen and naproxen. You may have tests for: Pregnancy. A pregnancy test involves having a urine or blood sample taken. Sexually transmitted infections (STIs). Placing an IUD in someone who has an STI can make the infection worse. Cervical cancer. You may have a Pap test to check for this type of cancer. This means collecting cells from your cervix to be checked under a microscope. You may have a physical exam to determine the size and position of your uterus. What happens during the procedure? A tool (speculum) will be placed in your vagina and widened so that your health care provider can see your cervix. Medicine, or antiseptic, may be applied to your cervix to help lower your risk of infection. You may be given an anesthetic medicine to numb each side of your cervix. This medicine is usually given by an injection into the cervix. A tool called a uterine sound will be inserted into your uterus to check the length of your uterus and the direction that your uterus may be tilted. A slim instrument or tube (IUD inserter) that holds the IUD will be inserted into your vagina,   through your cervical canal, and into your uterus. The IUD will be placed in the uterus, and the IUD inserter will be removed. The strings that are attached to the IUD will be trimmed  so that they lie just below the cervix. The speculum will be removed. The procedure may vary among health care providers and hospitals. What can I expect after procedure? You may have bleeding after the procedure. This is normal. It varies from light bleeding (spotting) for a few days to menstrual-like bleeding. You may have cramping and pain in the abdomen. You may feel dizzy or light-headed. You may have lower back pain. You may have headaches and nausea. Follow these instructions at home: Before resuming sexual activity, check to make sure that you can feel the IUD string or strings. You should be able to feel the end of the string below the opening of your cervix. If your IUD string is in place, you may resume sexual activity. If you had a hormonal IUD inserted more than 7 days after your most recent period started, you will need to use a backup method of birth control for 7 days after IUD insertion. Ask your health care provider whether this applies to you. Continue to check that the IUD is still in place by feeling for the strings after every menstrual period, or once a month. An IUD will not protect you from sexually transmitted infections (STIs). Use methods to prevent the exchange of body fluids between partners (barrier protection) every time you have sex. Barrier protection can be used during oral, vaginal, or anal sex. Commonly used barrier methods include: Female condom. Female condom. Dental dam. Take over-the-counter and prescription medicines only as told by your health care provider. Keep all follow-up visits. This is important. Contact a health care provider if: You feel light-headed or weak. You have any of the following problems with your IUD string or strings: The string bothers or hurts you or your sexual partner. You cannot feel the string. The string has gotten longer. You can feel the IUD in your vagina. You think you may be pregnant, or you miss your menstrual  period. You think you may have a sexually transmitted infection (STI). Get help right away if you: You have flu-like symptoms, such as tiredness (fatigue) and muscle aches. You have a fever and chills. You have bleeding that is heavier or lasts longer than a normal menstrual cycle. You have abnormal or bad-smelling discharge from your vagina. You develop abdominal pain that is new, is getting worse, or is not in the same area of earlier cramping and pain. You have pain during sexual activity. Summary An intrauterine device (IUD) is a small, T-shaped device that has one or two nylon strings hanging down from it. You may have a copper IUD or a hormone IUD. Ask your health care provider what you need to do before the procedure. You may have some tests and you may have to change or stop some medicines. You may have bleeding after the procedure. This is normal. It varies from light spotting for a few days to menstrual-like bleeding. Check to make sure that you can feel the IUD strings before you resume sexual activity. Check the strings after every menstrual period or once a month. An IUD does not protect against STIs. Use other methods to protect yourself against infections. This information is not intended to replace advice given to you by your health care provider. Make sure you discuss any questions you have with   your health care provider. Document Revised: 08/04/2019 Document Reviewed: 08/04/2019 Elsevier Patient Education  2022 Elsevier Inc.  

## 2020-08-18 ENCOUNTER — Other Ambulatory Visit: Payer: Self-pay | Admitting: Internal Medicine

## 2020-08-18 ENCOUNTER — Encounter: Payer: Self-pay | Admitting: Internal Medicine

## 2020-08-18 DIAGNOSIS — E230 Hypopituitarism: Secondary | ICD-10-CM

## 2020-08-21 ENCOUNTER — Encounter: Payer: Self-pay | Admitting: Physician Assistant

## 2020-08-22 ENCOUNTER — Other Ambulatory Visit: Payer: Self-pay

## 2020-08-24 ENCOUNTER — Other Ambulatory Visit: Payer: Self-pay

## 2020-09-12 ENCOUNTER — Encounter: Payer: Self-pay | Admitting: Cardiovascular Disease

## 2020-09-12 NOTE — Telephone Encounter (Signed)
Error

## 2020-09-14 ENCOUNTER — Ambulatory Visit: Payer: Self-pay | Attending: Internal Medicine

## 2020-09-14 ENCOUNTER — Other Ambulatory Visit: Payer: Self-pay

## 2020-09-14 ENCOUNTER — Ambulatory Visit: Payer: Self-pay | Admitting: Physician Assistant

## 2020-09-15 ENCOUNTER — Ambulatory Visit: Payer: Self-pay | Admitting: Physician Assistant

## 2020-10-12 ENCOUNTER — Other Ambulatory Visit: Payer: Self-pay

## 2020-10-12 ENCOUNTER — Ambulatory Visit (HOSPITAL_COMMUNITY): Payer: No Payment, Other | Admitting: Licensed Clinical Social Worker

## 2020-10-12 ENCOUNTER — Telehealth (HOSPITAL_COMMUNITY): Payer: Self-pay | Admitting: Licensed Clinical Social Worker

## 2020-10-12 NOTE — Telephone Encounter (Signed)
Pt signs on for video session per schedule. There are repeated tech issues with connection and audio. LCSW phones pt who agrees for CCA to be done in person at next avail appt.

## 2020-10-18 ENCOUNTER — Ambulatory Visit: Payer: Self-pay | Admitting: Physician Assistant

## 2020-10-22 ENCOUNTER — Encounter: Payer: Self-pay | Admitting: Internal Medicine

## 2020-10-23 ENCOUNTER — Telehealth (HOSPITAL_BASED_OUTPATIENT_CLINIC_OR_DEPARTMENT_OTHER): Payer: Self-pay | Admitting: Internal Medicine

## 2020-10-23 DIAGNOSIS — J069 Acute upper respiratory infection, unspecified: Secondary | ICD-10-CM

## 2020-10-23 DIAGNOSIS — R238 Other skin changes: Secondary | ICD-10-CM

## 2020-10-23 DIAGNOSIS — R233 Spontaneous ecchymoses: Secondary | ICD-10-CM

## 2020-10-23 MED ORDER — MOLNUPIRAVIR 200 MG PO CAPS
4.0000 | ORAL_CAPSULE | Freq: Two times a day (BID) | ORAL | 0 refills | Status: AC
  Filled 2020-10-23: qty 40, 5d supply, fill #0

## 2020-10-23 MED ORDER — BENZONATATE 100 MG PO CAPS
100.0000 mg | ORAL_CAPSULE | Freq: Two times a day (BID) | ORAL | 0 refills | Status: DC | PRN
  Filled 2020-10-23: qty 20, 10d supply, fill #0

## 2020-10-23 NOTE — Progress Notes (Signed)
Virtual Visit via Video Note  I connected with Kristen [name] on 10/23/20 at 3:18 p.m by a video enabled telemedicine application and verified that I am speaking with the correct person using two identifiers.  Location: Patient: home Provider: office   I discussed the limitations of evaluation and management by telemedicine and the availability of in person appointments. The patient expressed understanding and agreed to proceed.  History of Present Illness: Pt with hx of anx/dep, IBS,  PCOS, morbid obesity, Dep, RLS, social phobia, lymphocytic hypophysitis, hyperprolactinemia, severe persistent asthma, migraines, OSA CPAP, RLS, Ehlers-Danlos Syndrome This is an urgent care visit. Patient complains of upper respiratory symptoms that started 4 days ago.  Symptoms include congestion, sore throat, pain in the ears.  She noticed white foaminess on the tongue and at the back of the throat.  She has had tonsils removed.  She has not had any fever.  She notes shortness of breath only when she has a coughing fit.  She is taking over-the-counter cold and flu cough syrup and Benadryl.  Her mother is also sick.  Neither of them have been tested for COVID.  The patient has had 2 Pfizer COVID shots.  Her other concern is for possible von Willebrand's disease.  She has family history of it in 3 of her cousins, her maternal aunt and maternal grandmother.  She reports easy bruising.  Lab CBC in June was normal.  She bleeds easily from the gum and prolonged menstrual bleeding.  I referred her to the gynecologist for dysfunctional uterine bleeding with likely anovulatory cycles.  She was started on Megace. Outpatient Encounter Medications as of 10/23/2020  Medication Sig   albuterol (VENTOLIN HFA) 108 (90 Base) MCG/ACT inhaler Inhale 2 puffs into the lungs every 6 (six) hours as needed for wheezing or shortness of breath.   busPIRone (BUSPAR) 15 MG tablet TAKE 1 TABLET (15 MG TOTAL) BY MOUTH 3 (THREE) TIMES  DAILY FOR ANXIETY   diclofenac Sodium (VOLTAREN) 1 % GEL APPLY 2 G TOPICALLY 4 (FOUR) TIMES DAILY.   gabapentin (NEURONTIN) 300 MG capsule TAKE 1 CAPSULE (300 MG TOTAL) BY MOUTH AT BEDTIME.   hydrOXYzine (ATARAX/VISTARIL) 25 MG tablet TAKE 1 TABLET (25 MG TOTAL) BY MOUTH 3 (THREE) TIMES DAILY AS NEEDED FOR ANXIETY.   loperamide (IMODIUM A-D) 2 MG tablet Take 2 mg by mouth 4 (four) times daily as needed for diarrhea or loose stools.   LORazepam (ATIVAN) 1 MG tablet Take 1 tablet (1 mg total) by mouth every 8 (eight) hours as needed for anxiety.   megestrol (MEGACE) 40 MG tablet Take 2 tablets (80 mg total) by mouth daily. Can increase to two tablets twice a day in the event of heavy bleeding   naproxen (NAPROSYN) 500 MG tablet TAKE 1 TABLET (500 MG TOTAL) BY MOUTH 2 (TWO) TIMES DAILY WITH A MEAL. AS NEEDED FOR PAIN   omeprazole (PRILOSEC) 20 MG capsule Take 1 capsule (20 mg total) by mouth daily.   propranolol (INDERAL) 20 MG tablet Take 1 tablet (20 mg total) by mouth 2 (two) times daily. For anxiety   QUEtiapine (SEROQUEL) 100 MG tablet Take 1.5 tablets (150 mg total) by mouth at bedtime.   sertraline (ZOLOFT) 100 MG tablet Take 1.5 tablets (150 mg total) by mouth at bedtime.   traZODone (DESYREL) 50 MG tablet TAKE 1 TABLET (50 MG TOTAL) BY MOUTH AT BEDTIME AS NEEDED FOR SLEEP.   No facility-administered encounter medications on file as of 10/23/2020.   Marland Kitchen  Observations/Objective: Patient sitting down.  She is in no acute respiratory distress.  She has mild audible congestion.  She is able to talk in full sentences.  Assessment and Plan: 1. Acute upper respiratory infection Likely COVID.  Recommend that she gets her COVID test done as soon as possible.  Advised she can do one of the over-the-counter tests and if positive she can let me know so that we can prescribe oral antiviral medication to help shorten the course of her illness.  Advised to use the albuterol inhaler as needed.  I will  send a prescription to the pharmacy for Tessalon Perles to use as needed for the cough.  Use Tylenol as needed if any fever or body aches develop.  Advised use of Vicks vapor rub or Sudafed as needed for congestion. -Encourage her to quarantine and to wear a mask when around others.  2.  Easy bruising Will refer her to hematology for further evaluation of the easy bruising and bleeding especially given her family history of von Willebrand's disease.  Follow Up Instructions: As previously scheduled.   I discussed the assessment and treatment plan with the patient. The patient was provided an opportunity to ask questions and all were answered. The patient agreed with the plan and demonstrated an understanding of the instructions.   The patient was advised to call back or seek an in-person evaluation if the symptoms worsen or if the condition fails to improve as anticipated.  I spent 13 minutes dedicated to the care of this patient on the date of this encounter to include previsit review of chart, face-to-face time with patient discussing diagnosis and management and postvisit entering of orders.   Jonah Blue, MD

## 2020-10-24 ENCOUNTER — Other Ambulatory Visit: Payer: Self-pay

## 2020-10-25 ENCOUNTER — Telehealth: Payer: Self-pay | Admitting: Internal Medicine

## 2020-10-25 NOTE — Telephone Encounter (Signed)
Copied from CRM [id number]. Topic: General - Other >> Oct 19, 2020  4:00 PM Marylen Ponto wrote: Reason for CRM: Pt mother Steward Drone requested to speak with Mikle Bosworth. Pt mother stated pt received the orange and blue cards but did not receive the [facility] paper and pt has an appt next week with Endo due to a tumor on the brain. Pt mother request that Mikle Bosworth return her call asap. Cb# [phone]

## 2020-10-25 NOTE — Telephone Encounter (Signed)
I return the call, the Pt mother informed that finally received the letter from [facility]

## 2020-10-30 ENCOUNTER — Telehealth: Payer: Self-pay | Admitting: Oncology

## 2020-10-30 NOTE — Telephone Encounter (Signed)
Scheduled appt per 9/19 referral. Pt is aware of appt date and time.  

## 2020-11-01 ENCOUNTER — Other Ambulatory Visit: Payer: Self-pay

## 2020-11-01 ENCOUNTER — Ambulatory Visit (INDEPENDENT_AMBULATORY_CARE_PROVIDER_SITE_OTHER): Payer: Self-pay | Admitting: Endocrinology

## 2020-11-01 VITALS — BP 120/60 | HR 91 | Ht 66.0 in | Wt 322.4 lb

## 2020-11-01 DIAGNOSIS — L680 Hirsutism: Secondary | ICD-10-CM

## 2020-11-01 DIAGNOSIS — R7989 Other specified abnormal findings of blood chemistry: Secondary | ICD-10-CM

## 2020-11-01 DIAGNOSIS — N939 Abnormal uterine and vaginal bleeding, unspecified: Secondary | ICD-10-CM

## 2020-11-01 DIAGNOSIS — R635 Abnormal weight gain: Secondary | ICD-10-CM

## 2020-11-01 LAB — CORTISOL: Cortisol, Plasma: 7.5 ug/dL

## 2020-11-01 LAB — T4, FREE: Free T4: 0.84 ng/dL (ref 0.60–1.60)

## 2020-11-01 LAB — PROLACTIN: Prolactin: 15.3 ng/mL

## 2020-11-01 LAB — TSH: TSH: 2.99 u[IU]/mL (ref 0.35–5.50)

## 2020-11-01 NOTE — Progress Notes (Signed)
Subjective:    Patient ID: Kristen [name], female    DOB: 1985-09-13, 35 y.o.   MRN: [id number]  HPI Pt is ref by Dr Wynetta Emery.  I last saw this pt in 2018.  She has hyperprolactinemia (dx'ed 2018, when she presented with galactorrhea; she also had pituitary microadenoma, PCO, and lymphocytic hypophysitis; ACTH test was normal).  She has orange card.  She last took parlodel in 2019.  She has heavy and prolonged menses.   Past Medical History:  Diagnosis Date   Anxiety 2013   treated at [facility] by [place]    no hospitalization, or intubation, previously on advair and singulair. asthma worsening.    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 , dx in utero    Depression 2001   depressed since childhood    Dysrhythmia 2010   PVC's   Essential hypertension 03/12/2019   GERD (gastroesophageal reflux disease)    History of hiatal hernia    History of suicidal ideation    IBS (irritable bowel syndrome)    Kyphosis of thoracic region 2004    painful, runs in dad side    Migraine    Pancreatitis 2003   gallstone pancreatitis    Pancreatitis due to biliary obstruction 2003   PCOS (polycystic ovarian syndrome) 2014   facial hair, irregular periods, never been pregnant    Sleep apnea    no longer uses cpap    Past Surgical History:  Procedure Laterality Date   BLADDER SURGERY     CHOLECYSTECTOMY  2003    DILATATION & CURETTAGE/HYSTEROSCOPY WITH MYOSURE N/A 12/14/2019   Procedure: DILATATION & CURETTAGE/HYSTEROSCOPY WITH Polypectomy with MYOSURE;  Surgeon: Osborne Oman, MD;  Location: [facility];  Service: Gynecology;  Laterality: N/A;   OPEN REDUCTION INTERNAL FIXATION (ORIF) PROXIMAL PHALANX Right 07/23/2013   Procedure: OPEN TREATMENT RIGHT RING FINGER, PROXIMAL INTERPHALANGEAL/JOINT FRACTURE DISLOCATION;  Surgeon: Jolyn Nap, MD;  Location: [facility];  Service: Orthopedics;  Laterality: Right;   TONSILLECTOMY     and adenoidectomy     Social History   Socioeconomic History   Marital status: Single    Spouse name: Not on file   Number of children: 0   Years of education: college    Highest education level: Not on file  Occupational History   Occupation: Unemployed   Tobacco Use   Smoking status: Never   Smokeless tobacco: Never  Vaping Use   Vaping Use: Never used  Substance and Sexual Activity   Alcohol use: No   Drug use: No   Sexual activity: Yes    Birth control/protection: None  Other Topics Concern   Not on file  Social History Narrative   Lives with mom [name])    Right-handed   Caffeine: 3 cups of coffee per day   Social Determinants of Health   Financial Resource Strain: Not on file  Food Insecurity: Food Insecurity Present   Worried About Charity fundraiser in the Last Year: Often true   Arboriculturist in the Last Year: Sometimes true  Transportation Needs: No Transportation Needs   Lack of Transportation (Medical): No   Lack of Transportation (Non-Medical): No  Physical Activity: Not on file  Stress: Not on file  Social Connections: Not on file  Intimate Partner Violence: Not on file    Current Outpatient Medications on File Prior to Visit  Medication Sig Dispense Refill   albuterol (  VENTOLIN HFA) 108 (90 Base) MCG/ACT inhaler Inhale 2 puffs into the lungs every 6 (six) hours as needed for wheezing or shortness of breath. 6.7 g 3   benzonatate (TESSALON) 100 MG capsule Take 1 capsule (100 mg total) by mouth 2 (two) times daily as needed for cough. 20 capsule 0   busPIRone (BUSPAR) 15 MG tablet TAKE 1 TABLET (15 MG TOTAL) BY MOUTH 3 (THREE) TIMES DAILY FOR ANXIETY 90 tablet 2   diclofenac Sodium (VOLTAREN) 1 % GEL APPLY 2 G TOPICALLY 4 (FOUR) TIMES DAILY. 100 g 1   gabapentin (NEURONTIN) 300 MG capsule TAKE 1 CAPSULE (300 MG TOTAL) BY MOUTH AT BEDTIME. 30 capsule 3   hydrOXYzine (ATARAX/VISTARIL) 25 MG tablet TAKE 1 TABLET (25 MG TOTAL) BY MOUTH 3 (THREE) TIMES DAILY AS NEEDED FOR  ANXIETY. 90 tablet 2   loperamide (IMODIUM A-D) 2 MG tablet Take 2 mg by mouth 4 (four) times daily as needed for diarrhea or loose stools.     LORazepam (ATIVAN) 1 MG tablet Take 1 tablet (1 mg total) by mouth every 8 (eight) hours as needed for anxiety. 5 tablet 0   megestrol (MEGACE) 40 MG tablet Take 2 tablets (80 mg total) by mouth daily. Can increase to two tablets twice a day in the event of heavy bleeding 60 tablet 5   naproxen (NAPROSYN) 500 MG tablet TAKE 1 TABLET (500 MG TOTAL) BY MOUTH 2 (TWO) TIMES DAILY WITH A MEAL. AS NEEDED FOR PAIN 60 tablet 1   omeprazole (PRILOSEC) 20 MG capsule Take 1 capsule (20 mg total) by mouth daily. 30 capsule 3   propranolol (INDERAL) 20 MG tablet Take 1 tablet (20 mg total) by mouth 2 (two) times daily. For anxiety 60 tablet 2   QUEtiapine (SEROQUEL) 100 MG tablet Take 1.5 tablets (150 mg total) by mouth at bedtime. 45 tablet 2   sertraline (ZOLOFT) 100 MG tablet Take 1.5 tablets (150 mg total) by mouth at bedtime. 45 tablet 2   traZODone (DESYREL) 50 MG tablet TAKE 1 TABLET (50 MG TOTAL) BY MOUTH AT BEDTIME AS NEEDED FOR SLEEP. 30 tablet 2   No current facility-administered medications on file prior to visit.    Allergies  Allergen Reactions   Mucinex [Guaifenesin Er] Hives, Itching and Swelling   Penicillins Swelling    Did it involve swelling of the face/tongue/throat, SOB, or low BP? Yes Did it involve sudden or severe rash/hives, skin peeling, or any reaction on the inside of your mouth or nose? No Did you need to seek medical attention at a hospital or doctor's office? Yes When did it last happen?      >10 years ago If all above answers are "NO", may proceed with cephalosporin use.    Contrast Media [Iodinated Diagnostic Agents] Nausea And Vomiting   Latex Hives and Swelling   Multihance [Gadobenate] Nausea And Vomiting   Other     Pickles - throat swelling and nausea     Family History  Problem Relation Age of Onset   Hypertension  Mother    Arnold-Chiari malformation Mother    Restless legs syndrome Mother    Osteoporosis Mother    COPD Mother    Asthma Mother    Squamous cell carcinoma Mother        skin    Eczema Mother    Arthritis Mother    Peripheral Artery Disease Mother    Anxiety disorder Mother    OCD Mother    Hypertension Father  Scoliosis Father    Bipolar disorder Father    Congestive Heart Failure Father    COPD Father    Depression Father    Diabetes Maternal Grandmother    Hypertension Maternal Grandmother    Dementia Maternal Grandmother    OCD Maternal Grandmother    Cancer Paternal Grandmother        colon   Bone cancer Maternal Grandfather 82       bone marrow    Multiple myeloma Maternal Grandfather    Alcohol abuse Maternal Grandfather    Drug abuse Maternal Grandfather    Diabetes Paternal Grandfather    Alcohol abuse Paternal Grandfather    Drug abuse Paternal Grandfather    Dementia Paternal Grandfather    Alcohol abuse Maternal Aunt    Depression Maternal Aunt    Alcohol abuse Paternal Aunt    Depression Paternal Aunt    ADD / ADHD Cousin     BP 120/60 (BP Location: Right Arm, Patient Position: Sitting, Cuff Size: Large)   Pulse 91   Ht 5' 6" (1.676 m)   Wt (!) 322 lb 6.4 oz (146.2 kg)   SpO2 99%   BMI 52.04 kg/m    Review of Systems She has weight gain, fatigue, and heat intolerance.      Objective:   Physical Exam NECK: There is no palpable thyroid enlargement.  No thyroid nodule is palpable.  No palpable lymphadenopathy at the anterior neck.   Ext: no leg edema.   SKIN: terminal hair on the chin.    MRI (2021): pituitary gland itself appears normal but the stalk appears to be thickened.   I have reviewed outside records, and summarized: Pt was noted to have elevated prolactin, and referred here.  Main symptom was menorrhagia.   Prolactin=15    Assessment & Plan:  Lymphocytic hypophysitis, by hx.  Pituitary insuff in unlikely in view of  menstruation, but we'll check gonadotropins. Weight gain: check cortisol and TFT.  Elev prolactin: better.  No medication is needed now.   Patient Instructions  Blood tests are requested for you today.  We'll let you know about the results.

## 2020-11-02 ENCOUNTER — Encounter: Payer: Self-pay | Admitting: Oncology

## 2020-11-02 LAB — FOLLICLE STIMULATING HORMONE: FSH: 3.2 m[IU]/mL

## 2020-11-02 LAB — LUTEINIZING HORMONE: LH: 2.45 m[IU]/mL

## 2020-11-03 ENCOUNTER — Encounter: Payer: Self-pay | Admitting: Endocrinology

## 2020-11-03 LAB — TESTOSTERONE,FREE AND TOTAL
Testosterone, Free: 2.6 pg/mL (ref 0.0–4.2)
Testosterone: 35 ng/dL (ref 8–60)

## 2020-11-03 NOTE — Patient Instructions (Signed)
Blood tests are requested for you today.  We'll let you know about the results.  

## 2020-11-06 ENCOUNTER — Telehealth: Payer: Self-pay | Admitting: Oncology

## 2020-11-06 NOTE — Telephone Encounter (Signed)
Spoke to pt about r/s missed appt. Pt told me she had tested positive for Covid on 9/19. I r/s her appt for 21 days out from her positive test. Pt is aware of appt date and time.

## 2020-11-14 ENCOUNTER — Telehealth (INDEPENDENT_AMBULATORY_CARE_PROVIDER_SITE_OTHER): Payer: No Payment, Other | Admitting: Psychiatry

## 2020-11-14 ENCOUNTER — Encounter (HOSPITAL_COMMUNITY): Payer: Self-pay | Admitting: Psychiatry

## 2020-11-14 ENCOUNTER — Other Ambulatory Visit: Payer: Self-pay

## 2020-11-14 DIAGNOSIS — F331 Major depressive disorder, recurrent, moderate: Secondary | ICD-10-CM

## 2020-11-14 DIAGNOSIS — F4010 Social phobia, unspecified: Secondary | ICD-10-CM | POA: Diagnosis not present

## 2020-11-14 MED ORDER — TRAZODONE HCL 50 MG PO TABS
ORAL_TABLET | Freq: Every evening | ORAL | 3 refills | Status: DC | PRN
  Filled 2020-11-14: qty 30, 30d supply, fill #0

## 2020-11-14 MED ORDER — BUSPIRONE HCL 15 MG PO TABS
ORAL_TABLET | ORAL | 3 refills | Status: AC
Start: 2020-11-14 — End: 2020-11-17
  Filled 2020-11-14: qty 90, 30d supply, fill #0

## 2020-11-14 MED ORDER — SERTRALINE HCL 100 MG PO TABS
150.0000 mg | ORAL_TABLET | Freq: Every day | ORAL | 3 refills | Status: DC
  Filled 2020-11-14: qty 45, 30d supply, fill #0

## 2020-11-14 MED ORDER — QUETIAPINE FUMARATE 100 MG PO TABS
150.0000 mg | ORAL_TABLET | Freq: Every day | ORAL | 3 refills | Status: DC
  Filled 2020-11-14: qty 45, 30d supply, fill #0

## 2020-11-14 NOTE — Progress Notes (Signed)
[place] MD/PA/NP OP Progress Note Virtual Visit via Video Note  I connected with Kristen [name] on 11/14/20 at  2:00 PM EDT by a video enabled telemedicine application and verified that I am speaking with the correct person using two identifiers.  Location: Patient: Home Provider: Clinic   I discussed the limitations of evaluation and management by telemedicine and the availability of in person appointments. The patient expressed understanding and agreed to proceed.  I provided 30 minutes of non-face-to-face time during this encounter.    11/14/2020 2:36 PM Kristen [name]  MRN:  [id number]  Chief Complaint: "I have a lot anger and anxiety"  HPI:  35 year old female seen today for follow up psychiatric evaluation.  She has a psychiatric history of social phobia, ADD, anxiety, depression, and PTSD.  She is currently being managed on Zoloft 150 mg daily, trazodone 150 mg at bedtime, BuSpar 15 mg 3 times daily, hydroxyzine 25 mg 3 times a day as needed, gabapentin 300 mg nightly (prescribed by PCP), and propanolol 20 mg 2 times daily (recives from PCP).  She notes that her medications are effective in managing her psychiatric conditions.    Today patient's camera was turned off.  During exam she was pleasant, cooperative, and engaged in conversation.  She informed provider that she has a lot of anger and anxiety.  She notes that she is unable to articulate this or express it because she was taught to hold things in.  She notes that when she would express her feelings she would get in trouble by her father who has bipolar disorder.  Patient notes that currently she is worried about the people around her.  She notes that her father's dementia is worsening and he recently had surgery on his foot.  She also notes that her mother continues to undergo treatment for macular degeneration and has an appointment this week Friday.  She notes that her fianc lost his job and reports that she is  worried about her disability being improved.  She informed Probation officer that she has hired [employer] to appeal her case and help her through the process.  She also informed Probation officer that she has been having a menstrual cycle since July.  She notes that she discussed with her OB/GYN who recommended doing an IUD however she informed writer that she is skeptical about this as past family members has had complications.  Provider conducted a GAD-7 and patient scored 19, at her last visit she scored a 72.  Provider also conducted PHQ-9 and patient scored a 21, at her last visit she scored a 21.  She endorses passive SI however notes that she would not harm herself.  Today she denies SI/HI/AVH.  Patient notes that this week she began seeing shadows and bugs.  He informed Probation officer that this occurs when she is more stressed.  She notes that things will be better if she had someone to talk to.  Patient's therapy appointment is in November.    Patient informed Probation officer that she thinks that her mood will improve when she is able to speak to someone.  At this time medication changes not made.  Patient has a therapy appointment in November.  No other concerns at this time.    Visit Diagnosis:    ICD-10-CM   1. Social phobia  F40.10     2. Moderate episode of recurrent major depressive disorder (HCC)  F33.1       Past Psychiatric History: social phobia, ADD, anxiety,  depression, and PTSD.  Past Medical History:  Past Medical History:  Diagnosis Date   Anxiety 2013   treated at [facility] by [place]    no hospitalization, or intubation, previously on advair and singulair. asthma worsening.    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 , dx in utero    Depression 2001   depressed since childhood    Dysrhythmia 2010   PVC's   Essential hypertension 03/12/2019   GERD (gastroesophageal reflux disease)    History of hiatal hernia    History of suicidal ideation    IBS (irritable bowel syndrome)     Kyphosis of thoracic region 2004    painful, runs in dad side    Migraine    Pancreatitis 2003   gallstone pancreatitis    Pancreatitis due to biliary obstruction 2003   PCOS (polycystic ovarian syndrome) 2014   facial hair, irregular periods, never been pregnant    Sleep apnea    no longer uses cpap    Past Surgical History:  Procedure Laterality Date   BLADDER SURGERY     CHOLECYSTECTOMY  2003    DILATATION & CURETTAGE/HYSTEROSCOPY WITH MYOSURE N/A 12/14/2019   Procedure: DILATATION & CURETTAGE/HYSTEROSCOPY WITH Polypectomy with MYOSURE;  Surgeon: Osborne Oman, MD;  Location: [place];  Service: Gynecology;  Laterality: N/A;   OPEN REDUCTION INTERNAL FIXATION (ORIF) PROXIMAL PHALANX Right 07/23/2013   Procedure: OPEN TREATMENT RIGHT RING FINGER, PROXIMAL INTERPHALANGEAL/JOINT FRACTURE DISLOCATION;  Surgeon: Jolyn Nap, MD;  Location: [place];  Service: Orthopedics;  Laterality: Right;   TONSILLECTOMY     and adenoidectomy    Family Psychiatric History: Mother PTSD, Father Bipolar disorder and PTSD  Family History:  Family History  Problem Relation Age of Onset   Hypertension Mother    Arnold-Chiari malformation Mother    Restless legs syndrome Mother    Osteoporosis Mother    COPD Mother    Asthma Mother    Squamous cell carcinoma Mother        skin    Eczema Mother    Arthritis Mother    Peripheral Artery Disease Mother    Anxiety disorder Mother    OCD Mother    Hypertension Father    Scoliosis Father    Bipolar disorder Father    Congestive Heart Failure Father    COPD Father    Depression Father    Diabetes Maternal Grandmother    Hypertension Maternal Grandmother    Dementia Maternal Grandmother    OCD Maternal Grandmother    Cancer Paternal Grandmother        colon   Bone cancer Maternal Grandfather 82       bone marrow    Multiple myeloma Maternal Grandfather    Alcohol abuse Maternal Grandfather    Drug abuse Maternal  Grandfather    Diabetes Paternal Grandfather    Alcohol abuse Paternal Grandfather    Drug abuse Paternal Grandfather    Dementia Paternal Grandfather    Alcohol abuse Maternal Aunt    Depression Maternal Aunt    Alcohol abuse Paternal Aunt    Depression Paternal Aunt    ADD / ADHD Cousin     Social History:  Social History   Socioeconomic History   Marital status: Single    Spouse name: Not on file   Number of children: 0   Years of education: college    Highest education level: Not on file  Occupational History   Occupation: Unemployed   Tobacco Use   Smoking status: Never   Smokeless tobacco: Never  Vaping Use   Vaping Use: Never used  Substance and Sexual Activity   Alcohol use: No   Drug use: No   Sexual activity: Yes    Birth control/protection: None  Other Topics Concern   Not on file  Social History Narrative   Lives with mom [name])    Right-handed   Caffeine: 3 cups of coffee per day   Social Determinants of Health   Financial Resource Strain: Not on file  Food Insecurity: Food Insecurity Present   Worried About Charity fundraiser in the Last Year: Often true   Arboriculturist in the Last Year: Sometimes true  Transportation Needs: No Transportation Needs   Lack of Transportation (Medical): No   Lack of Transportation (Non-Medical): No  Physical Activity: Not on file  Stress: Not on file  Social Connections: Not on file    Allergies:  Allergies  Allergen Reactions   Mucinex [Guaifenesin Er] Hives, Itching and Swelling   Penicillins Swelling    Did it involve swelling of the face/tongue/throat, SOB, or low BP? Yes Did it involve sudden or severe rash/hives, skin peeling, or any reaction on the inside of your mouth or nose? No Did you need to seek medical attention at a hospital or doctor's office? Yes When did it last happen?      >10 years ago If all above answers are "NO", may proceed with cephalosporin use.    Contrast Media [Iodinated  Diagnostic Agents] Nausea And Vomiting   Latex Hives and Swelling   Multihance [Gadobenate] Nausea And Vomiting   Other     Pickles - throat swelling and nausea     Metabolic Disorder Labs: Lab Results  Component Value Date   HGBA1C 4.9 04/15/2019   MPG 93.93 04/15/2019   Lab Results  Component Value Date   PROLACTIN 15.3 11/01/2020   PROLACTIN 20.5 07/14/2020   Lab Results  Component Value Date   CHOL 204 (H) 04/15/2019   TRIG 146 04/15/2019   HDL 41 04/15/2019   CHOLHDL 5.0 04/15/2019   VLDL 29 04/15/2019   LDLCALC 134 (H) 04/15/2019   Lab Results  Component Value Date   TSH 2.99 11/01/2020   TSH 4.628 (H) 04/15/2019    Therapeutic Level Labs: No results found for: LITHIUM No results found for: VALPROATE No components found for:  CBMZ  Current Medications: Current Outpatient Medications  Medication Sig Dispense Refill   albuterol (VENTOLIN HFA) 108 (90 Base) MCG/ACT inhaler Inhale 2 puffs into the lungs every 6 (six) hours as needed for wheezing or shortness of breath. 6.7 g 3   benzonatate (TESSALON) 100 MG capsule Take 1 capsule (100 mg total) by mouth 2 (two) times daily as needed for cough. 20 capsule 0   busPIRone (BUSPAR) 15 MG tablet TAKE 1 TABLET (15 MG TOTAL) BY MOUTH 3 (THREE) TIMES DAILY FOR ANXIETY 90 tablet 2   diclofenac Sodium (VOLTAREN) 1 % GEL APPLY 2 G TOPICALLY 4 (FOUR) TIMES DAILY. 100 g 1   gabapentin (NEURONTIN) 300 MG capsule TAKE 1 CAPSULE (300 MG TOTAL) BY MOUTH AT BEDTIME. 30 capsule 3   hydrOXYzine (ATARAX/VISTARIL) 25 MG tablet TAKE 1 TABLET (25 MG TOTAL) BY MOUTH 3 (THREE) TIMES DAILY AS NEEDED FOR ANXIETY. 90 tablet 2   loperamide (IMODIUM A-D) 2 MG tablet Take 2 mg by mouth 4 (four) times daily  as needed for diarrhea or loose stools.     LORazepam (ATIVAN) 1 MG tablet Take 1 tablet (1 mg total) by mouth every 8 (eight) hours as needed for anxiety. 5 tablet 0   megestrol (MEGACE) 40 MG tablet Take 2 tablets (80 mg total) by mouth daily.  Can increase to two tablets twice a day in the event of heavy bleeding 60 tablet 5   naproxen (NAPROSYN) 500 MG tablet TAKE 1 TABLET (500 MG TOTAL) BY MOUTH 2 (TWO) TIMES DAILY WITH A MEAL. AS NEEDED FOR PAIN 60 tablet 1   omeprazole (PRILOSEC) 20 MG capsule Take 1 capsule (20 mg total) by mouth daily. 30 capsule 3   propranolol (INDERAL) 20 MG tablet Take 1 tablet (20 mg total) by mouth 2 (two) times daily. For anxiety 60 tablet 2   QUEtiapine (SEROQUEL) 100 MG tablet Take 1.5 tablets (150 mg total) by mouth at bedtime. 45 tablet 2   sertraline (ZOLOFT) 100 MG tablet Take 1.5 tablets (150 mg total) by mouth at bedtime. 45 tablet 2   traZODone (DESYREL) 50 MG tablet TAKE 1 TABLET (50 MG TOTAL) BY MOUTH AT BEDTIME AS NEEDED FOR SLEEP. 30 tablet 2   No current facility-administered medications for this visit.     Musculoskeletal: Strength & Muscle Tone:  Unable to assess due to telehealth visit, camera turned off Gait & Station:  Unable to assess due to telehealth visit, camera turned off Patient leans: N/A  Psychiatric Specialty Exam: Review of Systems  There were no vitals taken for this visit.There is no height or weight on file to calculate BMI.  General Appearance:  Unable to assess due to telehealth visit, camera turned off  Eye Contact:   Unable to assess due to telehealth visit, camera turned off  Speech:  Clear and Coherent and Normal Rate  Volume:  Normal  Mood:  Anxious and Depressed  Affect:  Congruent  Thought Process:  Coherent, Goal Directed and Linear  Orientation:  Full (Time, Place, and Person)  Thought Content: WDL and Logical   Suicidal Thoughts:  No  Homicidal Thoughts:  No  Memory:  Immediate;   Good Recent;   Good Remote;   Good  Judgement:  Good  Insight:  Good  Psychomotor Activity:   Unable to assess due to telehealth visit, camera turned off  Concentration:  Concentration: Good and Attention Span: Good  Recall:  Good  Fund of Knowledge: Good  Language:  Good  Akathisia:   Unable to assess due to telehealth visit, camera turned off  Handed:  Right  AIMS (if indicated): Not done  Assets:  Communication Skills Desire for Improvement Financial Resources/Insurance Housing Social Support  ADL's:  Intact  Cognition: WNL  Sleep:  Fair   Screenings: AIMS    Flowsheet Row Admission (Discharged) from OP Visit from 04/14/2019 in [facility]  AIMS Total Score 0      AUDIT    Flowsheet Row Admission (Discharged) from OP Visit from 04/14/2019 in [place] [facility]  Alcohol Use Disorder Identification Test Final Score (AUDIT) 0      GAD-7    Flowsheet Row Video Visit from 11/14/2020 in [facility] Office Visit from 08/17/2020 in [facility] [place] [facility] Video Visit from 08/14/2020 in [facility] Office Visit from 07/11/2020 in [place] Video Visit from 05/15/2020 in [facility]  Total GAD-7 Score 19 18 15 21 14       PHQ2-9    Flowsheet Row Video Visit from 11/14/2020 in [facility] Office Visit from 08/17/2020 in [facility] [place] [facility] Video Visit from 08/14/2020 in [facility] Office Visit from 07/11/2020 in [place] Video Visit from 05/15/2020 in [facility]  PHQ-2 Total Score 6 6 6 6 5   PHQ-9 Total Score 21 22 21 21 15       Flowsheet Row Video Visit from 11/14/2020 in [facility] Video Visit from 08/14/2020 in [facility] Video Visit from 11/18/2019 in [place] Error: Q7 should not be populated when Q6 is No Error: Q7 should not be populated when Q6 is No Low  Risk        Assessment and Plan: Patient notes that she has feelings depressed and anxiety due to life stressors.  Patient informed Probation officer that she thinks that her mood will improve when she is able to speak to someone.  At this time medication changes not made.  Patient has a therapy appointment in November.  1. Social phobia  [place]- busPIRone (BUSPAR) 15 MG tablet; TAKE 1 TABLET (15 MG TOTAL) BY MOUTH 3 (THREE) TIMES DAILY. FOR ANXIETY  Dispense: 90 tablet; Refill: 3 Continue- sertraline (ZOLOFT) 100 MG tablet; Take 1.5 tablets (150 mg total) by mouth at bedtime.  Dispense: 45 tablet; Refill: 3 Continue- hydrOXYzine (ATARAX/VISTARIL) 25 MG tablet; TAKE 1 TABLET (25 MG TOTAL) BY MOUTH 3 (THREE) TIMES DAILY AS NEEDED FOR ANXIETY.  Dispense: 90 tablet; Refill: 3  2. Moderate episode of recurrent major depressive disorder (HCC)  Conitnue- QUEtiapine (SEROQUEL) 100 MG tablet; TAKE 1 TABLET (100 MG TOTAL) BY MOUTH AT BEDTIME.  Dispense: 30 tablet; Refill: 3 Continue- sertraline (ZOLOFT) 100 MG tablet; Take 1.5 tablets (150 mg total) by mouth at bedtime.  Dispense: 45 tablet; Refill: 3 Conitnue- traZODone (DESYREL) 50 MG tablet; TAKE 1 TABLET (50 MG TOTAL) BY MOUTH AT BEDTIME AS NEEDED FOR SLEEP.  Dispense: 30 tablet; Refill: 3    Follow-up in 3 months Follow-up therapy Salley Slaughter, NP 11/14/2020, 2:36 PM

## 2020-11-15 ENCOUNTER — Inpatient Hospital Stay: Payer: Self-pay

## 2020-11-15 ENCOUNTER — Inpatient Hospital Stay: Payer: Self-pay | Attending: Oncology | Admitting: Oncology

## 2020-11-15 VITALS — BP 146/98 | HR 61 | Temp 97.6°F | Resp 17 | Ht 66.0 in | Wt 320.9 lb

## 2020-11-15 DIAGNOSIS — R58 Hemorrhage, not elsewhere classified: Secondary | ICD-10-CM

## 2020-11-15 DIAGNOSIS — Z832 Family history of diseases of the blood and blood-forming organs and certain disorders involving the immune mechanism: Secondary | ICD-10-CM | POA: Insufficient documentation

## 2020-11-15 LAB — CBC WITH DIFFERENTIAL (CANCER CENTER ONLY)
Abs Immature Granulocytes: 0.01 10*3/uL (ref 0.00–0.07)
Basophils Absolute: 0.1 10*3/uL (ref 0.0–0.1)
Basophils Relative: 1 %
Eosinophils Absolute: 0.6 10*3/uL — ABNORMAL HIGH (ref 0.0–0.5)
Eosinophils Relative: 9 %
HCT: 38.5 % (ref 36.0–46.0)
Hemoglobin: 13 g/dL (ref 12.0–15.0)
Immature Granulocytes: 0 %
Lymphocytes Relative: 42 %
Lymphs Abs: 3 10*3/uL (ref 0.7–4.0)
MCH: 28.4 pg (ref 26.0–34.0)
MCHC: 33.8 g/dL (ref 30.0–36.0)
MCV: 84.1 fL (ref 80.0–100.0)
Monocytes Absolute: 0.4 10*3/uL (ref 0.1–1.0)
Monocytes Relative: 5 %
Neutro Abs: 3.2 10*3/uL (ref 1.7–7.7)
Neutrophils Relative %: 43 %
Platelet Count: 232 10*3/uL (ref 150–400)
RBC: 4.58 MIL/uL (ref 3.87–5.11)
RDW: 13.5 % (ref 11.5–15.5)
WBC Count: 7.2 10*3/uL (ref 4.0–10.5)
nRBC: 0 % (ref 0.0–0.2)

## 2020-11-15 LAB — PROTIME-INR
INR: 1 (ref 0.8–1.2)
Prothrombin Time: 13.2 seconds (ref 11.4–15.2)

## 2020-11-15 LAB — APTT: aPTT: 29 seconds (ref 24–36)

## 2020-11-15 NOTE — Progress Notes (Signed)
Reason for the request:    Evaluation for bleeding disorder  HPI: I was asked by Dr. Wynetta Emery to evaluate Kristen [name] for the evaluation of easy bruising.  She is a 35 year old woman with history of PCOS, pancreatitis among other comorbid conditions.  She has reported easy bruising predominantly in the gum and prolonged menstrual cycles.  She does have a family history of von Willebrand disease and concern about these findings.  Clinically, she reports feeling well but has reported heavy menstrual cycles majority of her adult life.  She will also have had extensive bleeding after dental procedures previously.  She has also reported gum bleeding after cleaning as well.  She has not had any pregnancy complications and does not have children.  She did have gallbladder surgery that was also complicated by minor bleeding postoperatively.  She does not report any headaches, blurry vision, syncope or seizures. Does not report any fevers, chills or sweats.  Does not report any cough, wheezing or hemoptysis.  Does not report any chest pain, palpitation, orthopnea or leg edema.  Does not report any nausea, vomiting or abdominal pain.  Does not report any constipation or diarrhea.  Does not report any skeletal complaints.    Does not report frequency, urgency or hematuria.  Does not report any skin rashes or lesions. Does not report any heat or cold intolerance.  Does not report any lymphadenopathy or petechiae.  Does not report any anxiety or depression.  Remaining review of systems is negative.     Past Medical History:  Diagnosis Date   Anxiety 2013   treated at [facility] by [place]    no hospitalization, or intubation, previously on advair and singulair. asthma worsening.    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 , dx in utero    Depression 2001   depressed since childhood    Dysrhythmia 2010   PVC's   Essential hypertension 03/12/2019   GERD (gastroesophageal reflux disease)     History of hiatal hernia    History of suicidal ideation    IBS (irritable bowel syndrome)    Kyphosis of thoracic region 2004    painful, runs in dad side    Migraine    Pancreatitis 2003   gallstone pancreatitis    Pancreatitis due to biliary obstruction 2003   PCOS (polycystic ovarian syndrome) 2014   facial hair, irregular periods, never been pregnant    Sleep apnea    no longer uses cpap  :   Past Surgical History:  Procedure Laterality Date   BLADDER SURGERY     CHOLECYSTECTOMY  2003    DILATATION & CURETTAGE/HYSTEROSCOPY WITH MYOSURE N/A 12/14/2019   Procedure: DILATATION & CURETTAGE/HYSTEROSCOPY WITH Polypectomy with MYOSURE;  Surgeon: Osborne Oman, MD;  Location: [place];  Service: Gynecology;  Laterality: N/A;   OPEN REDUCTION INTERNAL FIXATION (ORIF) PROXIMAL PHALANX Right 07/23/2013   Procedure: OPEN TREATMENT RIGHT RING FINGER, PROXIMAL INTERPHALANGEAL/JOINT FRACTURE DISLOCATION;  Surgeon: Jolyn Nap, MD;  Location: [place];  Service: Orthopedics;  Laterality: Right;   TONSILLECTOMY     and adenoidectomy  :   Current Outpatient Medications:    albuterol (VENTOLIN HFA) 108 (90 Base) MCG/ACT inhaler, Inhale 2 puffs into the lungs every 6 (six) hours as needed for wheezing or shortness of breath., Disp: 6.7 g, Rfl: 3   benzonatate (TESSALON) 100 MG capsule, Take 1 capsule (100 mg total) by mouth 2 (two) times daily as  needed for cough., Disp: 20 capsule, Rfl: 0   busPIRone (BUSPAR) 15 MG tablet, TAKE 1 TABLET (15 MG TOTAL) BY MOUTH 3 (THREE) TIMES DAILY FOR ANXIETY, Disp: 90 tablet, Rfl: 3   diclofenac Sodium (VOLTAREN) 1 % GEL, APPLY 2 G TOPICALLY 4 (FOUR) TIMES DAILY., Disp: 100 g, Rfl: 1   gabapentin (NEURONTIN) 300 MG capsule, TAKE 1 CAPSULE (300 MG TOTAL) BY MOUTH AT BEDTIME., Disp: 30 capsule, Rfl: 3   hydrOXYzine (ATARAX/VISTARIL) 25 MG tablet, TAKE 1 TABLET (25 MG TOTAL) BY MOUTH 3 (THREE) TIMES DAILY AS NEEDED FOR ANXIETY., Disp: 90  tablet, Rfl: 2   loperamide (IMODIUM A-D) 2 MG tablet, Take 2 mg by mouth 4 (four) times daily as needed for diarrhea or loose stools., Disp: , Rfl:    LORazepam (ATIVAN) 1 MG tablet, Take 1 tablet (1 mg total) by mouth every 8 (eight) hours as needed for anxiety., Disp: 5 tablet, Rfl: 0   megestrol (MEGACE) 40 MG tablet, Take 2 tablets (80 mg total) by mouth daily. Can increase to two tablets twice a day in the event of heavy bleeding, Disp: 60 tablet, Rfl: 5   naproxen (NAPROSYN) 500 MG tablet, TAKE 1 TABLET (500 MG TOTAL) BY MOUTH 2 (TWO) TIMES DAILY WITH A MEAL. AS NEEDED FOR PAIN, Disp: 60 tablet, Rfl: 1   omeprazole (PRILOSEC) 20 MG capsule, Take 1 capsule (20 mg total) by mouth daily., Disp: 30 capsule, Rfl: 3   propranolol (INDERAL) 20 MG tablet, Take 1 tablet (20 mg total) by mouth 2 (two) times daily. For anxiety, Disp: 60 tablet, Rfl: 2   QUEtiapine (SEROQUEL) 100 MG tablet, Take 1.5 tablets (150 mg total) by mouth at bedtime., Disp: 45 tablet, Rfl: 3   sertraline (ZOLOFT) 100 MG tablet, Take 1.5 tablets (150 mg total) by mouth at bedtime., Disp: 45 tablet, Rfl: 3   traZODone (DESYREL) 50 MG tablet, TAKE 1 TABLET (50 MG TOTAL) BY MOUTH AT BEDTIME AS NEEDED FOR SLEEP., Disp: 30 tablet, Rfl: 3:   Allergies  Allergen Reactions   Mucinex [Guaifenesin Er] Hives, Itching and Swelling   Penicillins Swelling    Did it involve swelling of the face/tongue/throat, SOB, or low BP? Yes Did it involve sudden or severe rash/hives, skin peeling, or any reaction on the inside of your mouth or nose? No Did you need to seek medical attention at a hospital or doctor's office? Yes When did it last happen?      >10 years ago If all above answers are "NO", may proceed with cephalosporin use.    Contrast Media [Iodinated Diagnostic Agents] Nausea And Vomiting   Latex Hives and Swelling   Multihance [Gadobenate] Nausea And Vomiting   Other     Pickles - throat swelling and nausea   :   Family History   Problem Relation Age of Onset   Hypertension Mother    Arnold-Chiari malformation Mother    Restless legs syndrome Mother    Osteoporosis Mother    COPD Mother    Asthma Mother    Squamous cell carcinoma Mother        skin    Eczema Mother    Arthritis Mother    Peripheral Artery Disease Mother    Anxiety disorder Mother    OCD Mother    Hypertension Father    Scoliosis Father    Bipolar disorder Father    Congestive Heart Failure Father    COPD Father    Depression Father  Diabetes Maternal Grandmother    Hypertension Maternal Grandmother    Dementia Maternal Grandmother    OCD Maternal Grandmother    Cancer Paternal Grandmother        colon   Bone cancer Maternal Grandfather 82       bone marrow    Multiple myeloma Maternal Grandfather    Alcohol abuse Maternal Grandfather    Drug abuse Maternal Grandfather    Diabetes Paternal Grandfather    Alcohol abuse Paternal Grandfather    Drug abuse Paternal Grandfather    Dementia Paternal Grandfather    Alcohol abuse Maternal Aunt    Depression Maternal Aunt    Alcohol abuse Paternal Aunt    Depression Paternal Aunt    ADD / ADHD Cousin   :   Social History   Socioeconomic History   Marital status: Single    Spouse name: Not on file   Number of children: 0   Years of education: college    Highest education level: Not on file  Occupational History   Occupation: Unemployed   Tobacco Use   Smoking status: Never   Smokeless tobacco: Never  Vaping Use   Vaping Use: Never used  Substance and Sexual Activity   Alcohol use: No   Drug use: No   Sexual activity: Yes    Birth control/protection: None  Other Topics Concern   Not on file  Social History Narrative   Lives with mom [name])    Right-handed   Caffeine: 3 cups of coffee per day   Social Determinants of Health   Financial Resource Strain: Not on file  Food Insecurity: Food Insecurity Present   Worried About Charity fundraiser in the Last Year:  Often true   Ran Out of Food in the Last Year: Sometimes true  Transportation Needs: No Transportation Needs   Lack of Transportation (Medical): No   Lack of Transportation (Non-Medical): No  Physical Activity: Not on file  Stress: Not on file  Social Connections: Not on file  Intimate Partner Violence: Not on file  :  Pertinent items are noted in HPI.  Exam: Blood pressure (!) 146/98, pulse 61, temperature 97.6 F (36.4 C), temperature source Tympanic, resp. rate 17, height 5' 6"  (1.676 m), weight (!) 320 lb 14.4 oz (145.6 kg), SpO2 98 %. ECOG 1 General appearance: alert and cooperative appeared without distress. Head: atraumatic without any abnormalities. Eyes: conjunctivae/corneas clear. PERRL.  Sclera anicteric. Throat: lips, mucosa, and tongue normal; without oral thrush or ulcers. Resp: clear to auscultation bilaterally without rhonchi, wheezes or dullness to percussion. Cardio: regular rate and rhythm, S1, S2 normal, no murmur, click, rub or gallop GI: soft, non-tender; bowel sounds normal; no masses,  no organomegaly Skin: No ecchymosis or petechiae noted. Lymph nodes: Cervical, supraclavicular, and axillary nodes normal. Neurologic: Grossly normal without any motor, sensory or deep tendon reflexes. Musculoskeletal: No joint deformity or effusion.    Assessment and Plan:    35 year old with:  1.  Excessive bleeding noted on her menstrual cycles as well as gum bleeding in the setting of a family history of von Willebrand's disease.  She has also reported postoperative bleeding after gallbladder surgery and dental procedure.  The differential diagnosis of these findings were discussed at this time.  Inherited bleeding disorders such as von Willebrand's disease among others were reviewed.  She does have a family history which is certainly a possibility that she has inherited disorder.  To evaluate definitely, we will obtain coagulation panel  including von Willebrand's  disease and we will communicate these findings once available.  From a management standpoint, depending on the severity of her von Willebrand's disease and type management options will be tailored at that time.  She might require pretreatment with DDAVP or recombinant von Willebrand's factor depending on the severity and the procedure.  At this time, she does not have any active bleeding and intervention is not required.  2.  Follow-up: We will have her return in 6 months for routine evaluation.   45  minutes were dedicated to this visit. The time was spent on reviewing laboratory data, discussing treatment options, discussing differential diagnosis and answering questions regarding future plan.     A copy of this consult has been forwarded to the requesting physician.

## 2020-11-16 LAB — COAG STUDIES INTERP REPORT

## 2020-11-16 LAB — VON WILLEBRAND PANEL
Coagulation Factor VIII: 100 % (ref 56–140)
Ristocetin Co-factor, Plasma: 53 % (ref 50–200)
Von Willebrand Antigen, Plasma: 60 % (ref 50–200)

## 2020-11-21 ENCOUNTER — Ambulatory Visit (INDEPENDENT_AMBULATORY_CARE_PROVIDER_SITE_OTHER): Payer: Self-pay | Admitting: Physician Assistant

## 2020-11-21 ENCOUNTER — Encounter: Payer: Self-pay | Admitting: Physician Assistant

## 2020-11-21 ENCOUNTER — Other Ambulatory Visit: Payer: Self-pay

## 2020-11-21 ENCOUNTER — Telehealth: Payer: Self-pay | Admitting: *Deleted

## 2020-11-21 VITALS — BP 130/82 | HR 80 | Ht 66.5 in | Wt 321.2 lb

## 2020-11-21 DIAGNOSIS — Z9049 Acquired absence of other specified parts of digestive tract: Secondary | ICD-10-CM

## 2020-11-21 DIAGNOSIS — K219 Gastro-esophageal reflux disease without esophagitis: Secondary | ICD-10-CM

## 2020-11-21 DIAGNOSIS — R197 Diarrhea, unspecified: Secondary | ICD-10-CM

## 2020-11-21 MED ORDER — CHOLESTYRAMINE 4 G PO PACK
4.0000 g | PACK | Freq: Two times a day (BID) | ORAL | 3 refills | Status: DC
  Filled 2020-11-21: qty 60, 30d supply, fill #0

## 2020-11-21 NOTE — Telephone Encounter (Signed)
-----   Message from Benjiman Core, MD sent at 11/20/2020 12:02 PM EDT ----- Please let her know her labs are normal. No bleeding disorder

## 2020-11-21 NOTE — Progress Notes (Signed)
Chief Complaint: Diarrhea  HPI:    Kristen [name] is a 35 year old female with a past medical history as listed below including prior cholecystectomy, reflux and IBS as well as PCOS, who was referred to me by Ladell Pier, MD for a complaint of diarrhea.    11/15/2020 CBC normal.    Today, the patient presents to clinic and tells me that when she was 16 she had her gallbladder removed and ever since then she has had trouble with "irritable bowel and diarrhea".  Explains that over the past 5 to 6 years this has gotten worse to the point where she often has up to 10 loose stools a day which are "pale and orangey", tells me these can come on if she does not eat anything at all, but definitely happen whenever she eats or even sometimes just smells food.  Oftentimes she feels "fatigued after a bowel movement".  Tells me certain foods seem to make this worse including lettuce which "does not seem to digest", milk and dairy products.  Along with this describes a lot of bloating and gas.  She has never had work-up for the symptoms.  Does use Imodium occasionally which "sometimes helps".    Also describes reflux symptoms which are under moderate control with Omeprazole 20 mg once daily.  She describes that she will have breakthrough maybe 3 times a month.    Denies fever, chills, weight loss, blood in her stool or symptoms that awaken her from sleep.  Past Medical History:  Diagnosis Date  . Anxiety 2013   treated at [facility] by Pauline Good   . Asthma 1989    no hospitalization, or intubation, previously on advair and singulair. asthma worsening.   . 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 Depression 2001   depressed since childhood   . 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 Essential hypertension 03/12/2019  . GERD (gastroesophageal reflux disease)   . History of hiatal hernia   . History of suicidal ideation   . IBS (irritable bowel syndrome)   . Kyphosis of thoracic region 2004     painful, runs in dad side   . Migraine   . Pancreatitis 2003   gallstone pancreatitis   . Pancreatitis due to biliary obstruction 2003  . PCOS (polycystic ovarian syndrome) 2014   facial hair, irregular periods, never been pregnant   . Sleep apnea    no longer uses cpap    Past Surgical History:  Procedure Laterality Date  . BLADDER SURGERY    . CHOLECYSTECTOMY  2003   . DILATATION & CURETTAGE/HYSTEROSCOPY WITH MYOSURE N/A 12/14/2019   Procedure: DILATATION & CURETTAGE/HYSTEROSCOPY WITH Polypectomy with MYOSURE;  Surgeon: Osborne Oman, MD;  Location: [place];  Service: Gynecology;  Laterality: N/A;  . OPEN REDUCTION INTERNAL FIXATION (ORIF) PROXIMAL PHALANX Right 07/23/2013   Procedure: OPEN TREATMENT RIGHT RING FINGER, PROXIMAL INTERPHALANGEAL/JOINT FRACTURE DISLOCATION;  Surgeon: Jolyn Nap, MD;  Location: [place];  Service: Orthopedics;  Laterality: Right;  . TONSILLECTOMY     and adenoidectomy    Current Outpatient Medications  Medication Sig Dispense Refill  . albuterol (VENTOLIN HFA) 108 (90 Base) MCG/ACT inhaler Inhale 2 puffs into the lungs every 6 (six) hours as needed for wheezing or shortness of breath. 6.7 g 3  . benzonatate (TESSALON) 100 MG capsule Take 1 capsule (100 mg total) by mouth 2 (two) times daily as needed for cough.  20 capsule 0  . diclofenac Sodium (VOLTAREN) 1 % GEL APPLY 2 G TOPICALLY 4 (FOUR) TIMES DAILY. 100 g 1  . gabapentin (NEURONTIN) 300 MG capsule TAKE 1 CAPSULE (300 MG TOTAL) BY MOUTH AT BEDTIME. 30 capsule 3  . hydrOXYzine (ATARAX/VISTARIL) 25 MG tablet TAKE 1 TABLET (25 MG TOTAL) BY MOUTH 3 (THREE) TIMES DAILY AS NEEDED FOR ANXIETY. 90 tablet 2  . loperamide (IMODIUM A-D) 2 MG tablet Take 2 mg by mouth 4 (four) times daily as needed for diarrhea or loose stools.    Marland Kitchen LORazepam (ATIVAN) 1 MG tablet Take 1 tablet (1 mg total) by mouth every 8 (eight) hours as needed for anxiety. 5 tablet 0  . megestrol (MEGACE) 40 MG tablet  Take 2 tablets (80 mg total) by mouth daily. Can increase to two tablets twice a day in the event of heavy bleeding 60 tablet 5  . naproxen (NAPROSYN) 500 MG tablet TAKE 1 TABLET (500 MG TOTAL) BY MOUTH 2 (TWO) TIMES DAILY WITH A MEAL. AS NEEDED FOR PAIN 60 tablet 1  . omeprazole (PRILOSEC) 20 MG capsule Take 1 capsule (20 mg total) by mouth daily. 30 capsule 3  . propranolol (INDERAL) 20 MG tablet Take 1 tablet (20 mg total) by mouth 2 (two) times daily. For anxiety 60 tablet 2  . QUEtiapine (SEROQUEL) 100 MG tablet Take 1.5 tablets (150 mg total) by mouth at bedtime. 45 tablet 3  . sertraline (ZOLOFT) 100 MG tablet Take 1.5 tablets (150 mg total) by mouth at bedtime. 45 tablet 3  . traZODone (DESYREL) 50 MG tablet TAKE 1 TABLET (50 MG TOTAL) BY MOUTH AT BEDTIME AS NEEDED FOR SLEEP. 30 tablet 3   No current facility-administered medications for this visit.    Allergies as of 11/21/2020 - Review Complete 11/21/2020  Allergen Reaction Noted  . Mucinex [guaifenesin er] Hives, Itching, and Swelling 08/27/2011  . Penicillins Swelling 08/23/2016  . Contrast media [iodinated diagnostic agents] Nausea And Vomiting 08/23/2016  . Latex Hives and Swelling 12/13/2019  . Multihance [gadobenate] Nausea And Vomiting 07/14/2016  . Other  12/13/2019    Family History  Problem Relation Age of Onset  . Hypertension Mother   . Arnold-Chiari malformation Mother   . Restless legs syndrome Mother   . Osteoporosis Mother   . COPD Mother   . Asthma Mother   . Squamous cell carcinoma Mother        skin   . Eczema Mother   . Arthritis Mother   . Peripheral Artery Disease Mother   . Anxiety disorder Mother   . OCD Mother   . Hypertension Father   . Scoliosis Father   . Bipolar disorder Father   . Congestive Heart Failure Father   . COPD Father   . Depression Father   . Diabetes Maternal Grandmother   . Hypertension Maternal Grandmother   . Dementia Maternal Grandmother   . OCD Maternal Grandmother    . Cancer Paternal Grandmother        colon  . Bone cancer Maternal Grandfather 82       bone marrow   . Multiple myeloma Maternal Grandfather   . Alcohol abuse Maternal Grandfather   . Drug abuse Maternal Grandfather   . Diabetes Paternal Grandfather   . Alcohol abuse Paternal Grandfather   . Drug abuse Paternal Grandfather   . Dementia Paternal Grandfather   . Alcohol abuse Maternal Aunt   . Depression Maternal Aunt   . Alcohol abuse Paternal Aunt   .  Depression Paternal Aunt   . ADD / ADHD Cousin     Social History   Socioeconomic History  . Marital status: Single    Spouse name: Not on file  . Number of children: 0  . Years of education: college   . Highest education level: Not on file  Occupational History  . Occupation: Unemployed   Tobacco Use  . Smoking status: Never  . Smokeless tobacco: Never  Vaping Use  . Vaping Use: Never used  Substance and Sexual Activity  . Alcohol use: No  . Drug use: No  . Sexual activity: Yes    Birth control/protection: None  Other Topics Concern  . Not on file  Social History Narrative   Lives with mom [name])    Right-handed   Caffeine: 3 cups of coffee per day   Social Determinants of Health   Financial Resource Strain: Not on file  Food Insecurity: Food Insecurity Present  . Worried About Charity fundraiser in the Last Year: Often true  . Ran Out of Food in the Last Year: Sometimes true  Transportation Needs: No Transportation Needs  . Lack of Transportation (Medical): No  . Lack of Transportation (Non-Medical): No  Physical Activity: Not on file  Stress: Not on file  Social Connections: Not on file  Intimate Partner Violence: Not on file    Review of Systems:    Constitutional: No weight loss, fever or chills Skin: No rash Cardiovascular: No chest pain Respiratory: No SOB Gastrointestinal: See HPI and otherwise negative Genitourinary: No dysuria Neurological: No headache, dizziness or  syncope Musculoskeletal: No new muscle or joint pain Hematologic: No bruising Psychiatric: No history of depression or anxiety   Physical Exam:  Vital signs: BP 130/82   Pulse 80   Ht 5' 6.5" (1.689 m)   Wt (!) 321 lb 4 oz (145.7 kg)   BMI 51.07 kg/m    Constitutional:   Pleasant morbidly obese Caucasian female appears to be in NAD, Well developed, Well nourished, alert and cooperative Head:  Normocephalic and atraumatic. Eyes:   PEERL, EOMI. No icterus. Conjunctiva pink. Ears:  Normal auditory acuity. Neck:  Supple Throat: Oral cavity and pharynx without inflammation, swelling or lesion.  Respiratory: Respirations even and unlabored. Lungs clear to auscultation bilaterally.   No wheezes, crackles, or rhonchi.  Cardiovascular: Normal S1, S2. No MRG. Regular rate and rhythm. No peripheral edema, cyanosis or pallor.  Gastrointestinal:  Soft, nondistended, mild epigastric ttp. No rebound or guarding. Normal bowel sounds. No appreciable masses or hepatomegaly. Rectal:  Not performed.  Msk:  Symmetrical without gross deformities. Without edema, no deformity or joint abnormality.  Neurologic:  Alert and  oriented x4;  grossly normal neurologically.  Skin:   Dry and intact without significant lesions or rashes. Psychiatric: Demonstrates good judgement and reason without abnormal affect or behaviors.  RELEVANT LABS AND IMAGING: CBC    Component Value Date/Time   WBC 7.2 11/15/2020 1131   WBC 9.2 12/14/2019 1032   RBC 4.58 11/15/2020 1131   HGB 13.0 11/15/2020 1131   HGB 14.5 07/14/2020 0937   HCT 38.5 11/15/2020 1131   HCT 44.1 07/14/2020 0937   PLT 232 11/15/2020 1131   PLT 237 07/14/2020 0937   MCV 84.1 11/15/2020 1131   MCV 87 07/14/2020 0937   MCH 28.4 11/15/2020 1131   MCHC 33.8 11/15/2020 1131   RDW 13.5 11/15/2020 1131   RDW 13.0 07/14/2020 0937   LYMPHSABS 3.0 11/15/2020 1131   LYMPHSABS  3.3 (H) 07/22/2017 1339   MONOABS 0.4 11/15/2020 1131   EOSABS 0.6 (H)  11/15/2020 1131   EOSABS 0.5 (H) 07/22/2017 1339   BASOSABS 0.1 11/15/2020 1131   BASOSABS 0.0 07/22/2017 1339    CMP     Component Value Date/Time   NA 141 07/14/2020 0937   K 3.9 07/14/2020 0937   CL 105 07/14/2020 0937   CO2 20 07/14/2020 0937   GLUCOSE 81 07/14/2020 0937   GLUCOSE 92 12/14/2019 1032   BUN 12 07/14/2020 0937   CREATININE 0.97 07/14/2020 0937   CREATININE 1.09 03/25/2014 1730   CALCIUM 9.1 07/14/2020 0937   PROT 6.7 04/15/2019 0624   PROT 7.0 04/15/2019 0624   PROT 6.4 07/22/2017 1339   ALBUMIN 3.7 04/15/2019 0624   ALBUMIN 3.8 04/15/2019 0624   ALBUMIN 4.2 07/22/2017 1339   AST 20 04/15/2019 0624   AST 20 04/15/2019 0624   ALT 22 04/15/2019 0624   ALT 22 04/15/2019 0624   ALKPHOS 112 04/15/2019 0624   ALKPHOS 111 04/15/2019 0624   BILITOT 0.7 04/15/2019 0624   BILITOT 0.5 04/15/2019 0624   BILITOT 0.4 07/22/2017 1339   GFRNONAA >60 12/14/2019 1032   GFRNONAA 70 03/25/2014 1730   GFRAA >60 04/15/2019 0624   GFRAA 80 03/25/2014 1730    Assessment: 1.  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  Status postcholecystectomy: Cholecystectomy at the age of 89 3.  GERD: Moderately controlled on Omeprazole 20 mg daily  Plan: 1.  Discussed with patient that most likely her symptoms are from bile salt since she no longer has a gallbladder, especially since they have gotten worse ever since she had her gallbladder out. 2.  Ordered stool studies to include GI pathogen panel, O&P and fecal calprotectin to consider other causes. 3.  Prescribed Cholestyramine 4 g packet, 1 packet twice daily #60 with 3 refills. 4.  Continue Omeprazole 20 mg daily for reflux 5.  Discussed with patient that I will follow-up with her in 4 to 6 weeks.  If she is not better or does not  feel like the Cholestyramine is helping and stool studies are negative then would recommend a colonoscopy for further evaluation. She was assigned to Dr. Silverio Decamp this afternoon.  Ellouise Newer, PA-C [facility] 11/21/2020, 3:10 PM  Cc: Ladell Pier, MD

## 2020-11-21 NOTE — Patient Instructions (Addendum)
If you are age 33 or older, your body mass index should be between 23-30. Your Body mass index is 51.07 kg/m. If this is out of the aforementioned range listed, please consider follow up with your Primary Care Provider.  If you are age 19 or younger, your body mass index should be between 19-25. Your Body mass index is 51.07 kg/m. If this is out of the aformentioned range listed, please consider follow up with your Primary Care Provider.   ________________________________________________________  The [place] [facility] providers would like to encourage you to use [facility] to communicate with providers for non-urgent requests or questions.  Due to long hold times on the telephone, sending your provider a message by [facility] may be a faster and more efficient way to get a response.  Please allow 48 business hours for a response.  Please remember that this is for non-urgent requests.  _______________________________________________________  We have sent the following medications to your pharmacy for you to pick up at your convenience: Questran 1 packet 2 times daily take 2 hours before or after all other medications  Your provider has requested that you go to the basement level for lab work before leaving today. Press "B" on the elevator. The lab is located at the first door on the left as you exit the elevator.  Next appointment is 12-26-2020 at 3:30pm. If you have any questions please call [phone].  It was a pleasure to see you today!  Thank you for trusting me with your gastrointestinal care!    Jennifer L. Zerita Boers, PA-C

## 2020-11-21 NOTE — Telephone Encounter (Signed)
Called patient to let her know that lab results were normal.  Spoke directly with patient.

## 2020-11-22 ENCOUNTER — Other Ambulatory Visit: Payer: Self-pay

## 2020-11-22 ENCOUNTER — Encounter: Payer: Self-pay | Admitting: Internal Medicine

## 2020-11-22 MED ORDER — SULFAMETHOXAZOLE-TRIMETHOPRIM 800-160 MG PO TABS
1.0000 | ORAL_TABLET | Freq: Two times a day (BID) | ORAL | 0 refills | Status: DC
  Filled 2020-11-22: qty 6, 3d supply, fill #0

## 2020-11-23 ENCOUNTER — Other Ambulatory Visit: Payer: Self-pay

## 2020-11-23 DIAGNOSIS — R197 Diarrhea, unspecified: Secondary | ICD-10-CM

## 2020-11-23 DIAGNOSIS — K219 Gastro-esophageal reflux disease without esophagitis: Secondary | ICD-10-CM

## 2020-11-27 LAB — OVA AND PARASITE EXAMINATION
CONCENTRATE RESULT:: NONE SEEN
MICRO NUMBER:: 12529203
SPECIMEN QUALITY:: ADEQUATE
TRICHROME RESULT:: NONE SEEN

## 2020-11-28 LAB — GI PROFILE, STOOL, PCR

## 2020-11-28 LAB — CALPROTECTIN, FECAL: Calprotectin, Fecal: <16 ug/g (ref 0–120)

## 2020-12-13 ENCOUNTER — Ambulatory Visit (INDEPENDENT_AMBULATORY_CARE_PROVIDER_SITE_OTHER): Payer: No Payment, Other | Admitting: Licensed Clinical Social Worker

## 2020-12-13 ENCOUNTER — Other Ambulatory Visit: Payer: Self-pay

## 2020-12-13 DIAGNOSIS — F4310 Post-traumatic stress disorder, unspecified: Secondary | ICD-10-CM | POA: Diagnosis not present
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
## 2020-12-26 ENCOUNTER — Ambulatory Visit (INDEPENDENT_AMBULATORY_CARE_PROVIDER_SITE_OTHER): Payer: Self-pay | Admitting: Physician Assistant

## 2020-12-26 ENCOUNTER — Encounter: Payer: Self-pay | Admitting: Physician Assistant

## 2020-12-26 VITALS — BP 130/80 | HR 62 | Ht 66.5 in | Wt 321.5 lb

## 2020-12-26 DIAGNOSIS — R197 Diarrhea, unspecified: Secondary | ICD-10-CM

## 2020-12-26 NOTE — Patient Instructions (Signed)
If you are age 33 or younger, your body mass index should be between 19-25. Your Body mass index is 51.11 kg/m. If this is out of the aformentioned range listed, please consider follow up with your Primary Care Provider.  ________________________________________________________  The [place] [facility] providers would like to encourage you to use [facility] to communicate with providers for non-urgent requests or questions.  Due to long hold times on the telephone, sending your provider a message by [facility] may be a faster and more efficient way to get a response.  Please allow 48 business hours for a response.  Please remember that this is for non-urgent requests.  _______________________________________________________  Bonita Quin will follow up in our office in 2 months.  We will contact you to schedule this appointment.  Thank you for entrusting me with your care and choosing [facility].  Hyacinth Meeker, PA-C

## 2020-12-26 NOTE — Progress Notes (Signed)
Chief Complaint: Follow-up diarrhea   HPI:    Kristen [name] is a 35 year old female, assigned to Dr. Silverio Decamp at last visit, with a past medical history as listed below including prior cholecystectomy, reflux and IBS as well as PCOS, who returns to clinic today for follow-up of her diarrhea.    11/21/2020 patient seen in clinic and described her previous cholecystectomy and trouble with IBS with diarrhea ever since.  Also discussed reflux symptoms which are under moderate control with Omeprazole 20 mg once daily.  At that time we discussed that her symptoms are likely from bile salts and she no longer had a gallbladder.  Ordered stool studies including GI pathogen panel, O&P and fecal calprotectin to consider other causes and started Cholestyramine 4 g packet 1 packet twice daily.  She is continued on Omeprazole 20 mg daily.  Explained that if she is not feeling better with the Cholestyramine and the stool studies are negative then would recommend a colonoscopy.    11/24/2018 stool studies negative.    Today, the patient tells me that she only just started the cholestyramine about a week or week and a half ago and has been only taking it once a day, she is working up to taking it twice a day because she is "battling the texture".  Tells me that she has noticed that it is may be helping a little bit.  Tells me she waited so long to start it because she was previously on antibiotics and Azo for a UTI and this had made her constipated so she did not want to start this medication in addition.  Otherwise no new symptoms.    Denies fever, chills, weight loss or blood in her stool.  Past Medical History:  Diagnosis Date  . Anxiety 2013   treated at [facility] by Pauline Good   . Asthma 1989    no hospitalization, or intubation, previously on advair and singulair. asthma worsening.   . 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 Depression 2001   depressed since childhood   . Dysrhythmia  2010   PVC's  . Essential hypertension 03/12/2019  . GERD (gastroesophageal reflux disease)   . History of hiatal hernia   . History of suicidal ideation   . IBS (irritable bowel syndrome)   . Kyphosis of thoracic region 2004    painful, runs in dad side   . Migraine   . Pancreatitis 2003   gallstone pancreatitis   . Pancreatitis due to biliary obstruction 2003  . PCOS (polycystic ovarian syndrome) 2014   facial hair, irregular periods, never been pregnant   . Sleep apnea    no longer uses cpap    Past Surgical History:  Procedure Laterality Date  . BLADDER SURGERY    . CHOLECYSTECTOMY  2003   . DILATATION & CURETTAGE/HYSTEROSCOPY WITH MYOSURE N/A 12/14/2019   Procedure: DILATATION & CURETTAGE/HYSTEROSCOPY WITH Polypectomy with MYOSURE;  Surgeon: Osborne Oman, MD;  Location: [place];  Service: Gynecology;  Laterality: N/A;  . OPEN REDUCTION INTERNAL FIXATION (ORIF) PROXIMAL PHALANX Right 07/23/2013   Procedure: OPEN TREATMENT RIGHT RING FINGER, PROXIMAL INTERPHALANGEAL/JOINT FRACTURE DISLOCATION;  Surgeon: Jolyn Nap, MD;  Location: [place];  Service: Orthopedics;  Laterality: Right;  . TONSILLECTOMY     and adenoidectomy    Current Outpatient Medications  Medication Sig Dispense Refill  . albuterol (VENTOLIN HFA) 108 (90 Base) MCG/ACT inhaler Inhale 2 puffs into the  lungs every 6 (six) hours as needed for wheezing or shortness of breath. 6.7 g 3  . benzonatate (TESSALON) 100 MG capsule Take 1 capsule (100 mg total) by mouth 2 (two) times daily as needed for cough. 20 capsule 0  . cholestyramine (QUESTRAN) 4 g packet Take 1 packet (4 g total) by mouth 2 (two) times daily. Take 2 hours before or after all other medications. 60 each 3  . diclofenac Sodium (VOLTAREN) 1 % GEL APPLY 2 G TOPICALLY 4 (FOUR) TIMES DAILY. 100 g 1  . gabapentin (NEURONTIN) 300 MG capsule TAKE 1 CAPSULE (300 MG TOTAL) BY MOUTH AT BEDTIME. 30 capsule 3  . hydrOXYzine (ATARAX/VISTARIL)  25 MG tablet TAKE 1 TABLET (25 MG TOTAL) BY MOUTH 3 (THREE) TIMES DAILY AS NEEDED FOR ANXIETY. 90 tablet 2  . loperamide (IMODIUM A-D) 2 MG tablet Take 2 mg by mouth 4 (four) times daily as needed for diarrhea or loose stools.    Marland Kitchen LORazepam (ATIVAN) 1 MG tablet Take 1 tablet (1 mg total) by mouth every 8 (eight) hours as needed for anxiety. 5 tablet 0  . megestrol (MEGACE) 40 MG tablet Take 2 tablets (80 mg total) by mouth daily. Can increase to two tablets twice a day in the event of heavy bleeding 60 tablet 5  . naproxen (NAPROSYN) 500 MG tablet TAKE 1 TABLET (500 MG TOTAL) BY MOUTH 2 (TWO) TIMES DAILY WITH A MEAL. AS NEEDED FOR PAIN 60 tablet 1  . omeprazole (PRILOSEC) 20 MG capsule Take 1 capsule (20 mg total) by mouth daily. 30 capsule 3  . propranolol (INDERAL) 20 MG tablet Take 1 tablet (20 mg total) by mouth 2 (two) times daily. For anxiety 60 tablet 2  . QUEtiapine (SEROQUEL) 100 MG tablet Take 1.5 tablets (150 mg total) by mouth at bedtime. 45 tablet 3  . sertraline (ZOLOFT) 100 MG tablet Take 1.5 tablets (150 mg total) by mouth at bedtime. 45 tablet 3  . sulfamethoxazole-trimethoprim (BACTRIM DS) 800-160 MG tablet Take 1 tablet by mouth 2 (two) times daily. 6 tablet 0  . traZODone (DESYREL) 50 MG tablet TAKE 1 TABLET (50 MG TOTAL) BY MOUTH AT BEDTIME AS NEEDED FOR SLEEP. 30 tablet 3   No current facility-administered medications for this visit.    Allergies as of 12/26/2020 - Review Complete 11/21/2020  Allergen Reaction Noted  . Mucinex [guaifenesin er] Hives, Itching, and Swelling 08/27/2011  . Penicillins Swelling 08/23/2016  . Contrast media [iodinated diagnostic agents] Nausea And Vomiting 08/23/2016  . Latex Hives and Swelling 12/13/2019  . Multihance [gadobenate] Nausea And Vomiting 07/14/2016  . Other  12/13/2019    Family History  Problem Relation Age of Onset  . Hypertension Mother   . Arnold-Chiari malformation Mother   . Restless legs syndrome Mother   .  Osteoporosis Mother   . COPD Mother   . Asthma Mother   . Squamous cell carcinoma Mother        skin   . Eczema Mother   . Arthritis Mother   . Peripheral Artery Disease Mother   . Anxiety disorder Mother   . OCD Mother   . Colonic polyp Mother   . Hypertension Father   . Scoliosis Father   . Bipolar disorder Father   . Congestive Heart Failure Father   . COPD Father   . Depression Father   . Diabetes Maternal Grandmother   . Hypertension Maternal Grandmother   . Dementia Maternal Grandmother   . OCD Maternal Grandmother   .  Bone cancer Maternal Grandfather 82       bone marrow   . Multiple myeloma Maternal Grandfather   . Alcohol abuse Maternal Grandfather   . Drug abuse Maternal Grandfather   . Cancer Paternal Grandmother        colon  . Diabetes Paternal Grandfather   . Alcohol abuse Paternal Grandfather   . Drug abuse Paternal Grandfather   . Dementia Paternal Grandfather   . Alcohol abuse Maternal Aunt   . Depression Maternal Aunt   . Pancreatic cancer Maternal Aunt   . Alcohol abuse Paternal Aunt   . Depression Paternal Aunt   . ADD / ADHD Cousin     Social History   Socioeconomic History  . Marital status: Single    Spouse name: Not on file  . Number of children: 0  . Years of education: college   . Highest education level: Not on file  Occupational History  . Occupation: Unemployed   Tobacco Use  . Smoking status: Never  . Smokeless tobacco: Never  Vaping Use  . Vaping Use: Never used  Substance and Sexual Activity  . Alcohol use: No  . Drug use: No  . Sexual activity: Yes    Birth control/protection: None  Other Topics Concern  . Not on file  Social History Narrative   Lives with mom Kristen [name])    Right-handed   Caffeine: 3 cups of coffee per day   Social Determinants of Health   Financial Resource Strain: Not on file  Food Insecurity: Food Insecurity Present  . Worried About Charity fundraiser in the Last Year: Often true  . Ran Out of  Food in the Last Year: Sometimes true  Transportation Needs: No Transportation Needs  . Lack of Transportation (Medical): No  . Lack of Transportation (Non-Medical): No  Physical Activity: Not on file  Stress: Not on file  Social Connections: Not on file  Intimate Partner Violence: Not on file    Review of Systems:    Constitutional: No weight loss, fever or chills Cardiovascular: No chest pain Respiratory: No SOB  Gastrointestinal: See HPI and otherwise negative    Physical Exam:  Vital signs: BP 130/80   Pulse 62   Ht 5' 6.5" (1.689 m)   Wt (!) 321 lb 8 oz (145.8 kg)   BMI 51.11 kg/m    Constitutional:   Pleasant Obese Caucasian female appears to be in NAD, Well developed, Well nourished, alert and cooperative Respiratory: Respirations even and unlabored. Lungs clear to auscultation bilaterally.   No wheezes, crackles, or rhonchi.  Cardiovascular: Normal S1, S2. No MRG. Regular rate and rhythm. No peripheral edema, cyanosis or pallor.  Gastrointestinal:  Soft, nondistended, nontender. No rebound or guarding. Normal bowel sounds. No appreciable masses or hepatomegaly. Rectal:  Not performed.  Psychiatric:  Demonstrates good judgement and reason without abnormal affect or behaviors.  See HPI for recent labs.  Assessment: 1.  Diarrhea: Thought bile salt induced, had prescribed Cholestyramine at last visit but patient just started about a week ago and is only taking it once a day, is having minimal relief  Plan: 1.  Recommend the patient take her Cholestyramine as prescribed twice daily.  After she does this for the next couple of months we will see how she is doing.  Again if she has no benefit from this then would recommend a colonoscopy. 2.  Patient follow in clinic with me in 2 months.  Ellouise Newer, PA-C [facility] 12/26/2020, 3:31 PM  Cc: Ladell Pier, MD

## 2021-01-04 ENCOUNTER — Encounter: Payer: Self-pay | Admitting: Obstetrics & Gynecology

## 2021-01-07 ENCOUNTER — Emergency Department (HOSPITAL_COMMUNITY)
Admission: EM | Admit: 2021-01-07 | Discharge: 2021-01-07 | Disposition: A | Discharge status: Home / Self Care | Payer: Self-pay | Attending: Emergency Medicine | Admitting: Emergency Medicine

## 2021-01-07 ENCOUNTER — Encounter (HOSPITAL_COMMUNITY): Payer: Self-pay | Admitting: Emergency Medicine

## 2021-01-07 ENCOUNTER — Other Ambulatory Visit: Payer: Self-pay

## 2021-01-07 ENCOUNTER — Emergency Department (HOSPITAL_COMMUNITY): Payer: Self-pay

## 2021-01-07 DIAGNOSIS — N9489 Other specified conditions associated with female genital organs and menstrual cycle: Secondary | ICD-10-CM | POA: Insufficient documentation

## 2021-01-07 DIAGNOSIS — R102 Pelvic and perineal pain: Secondary | ICD-10-CM | POA: Insufficient documentation

## 2021-01-07 DIAGNOSIS — N939 Abnormal uterine and vaginal bleeding, unspecified: Secondary | ICD-10-CM

## 2021-01-07 DIAGNOSIS — N83201 Unspecified ovarian cyst, right side: Secondary | ICD-10-CM | POA: Insufficient documentation

## 2021-01-07 DIAGNOSIS — Z91040 Latex allergy status: Secondary | ICD-10-CM | POA: Insufficient documentation

## 2021-01-07 DIAGNOSIS — I10 Essential (primary) hypertension: Secondary | ICD-10-CM | POA: Insufficient documentation

## 2021-01-07 DIAGNOSIS — J4520 Mild intermittent asthma, uncomplicated: Secondary | ICD-10-CM | POA: Insufficient documentation

## 2021-01-07 LAB — CBC WITH DIFFERENTIAL/PLATELET
Abs Immature Granulocytes: 0.01 10*3/uL (ref 0.00–0.07)
Basophils Absolute: 0.1 10*3/uL (ref 0.0–0.1)
Basophils Relative: 1 %
Eosinophils Absolute: 0.5 10*3/uL (ref 0.0–0.5)
Eosinophils Relative: 5 %
HCT: 42.8 % (ref 36.0–46.0)
Hemoglobin: 13.5 g/dL (ref 12.0–15.0)
Immature Granulocytes: 0 %
Lymphocytes Relative: 43 %
Lymphs Abs: 3.9 10*3/uL (ref 0.7–4.0)
MCH: 27.6 pg (ref 26.0–34.0)
MCHC: 31.5 g/dL (ref 30.0–36.0)
MCV: 87.5 fL (ref 80.0–100.0)
Monocytes Absolute: 0.4 10*3/uL (ref 0.1–1.0)
Monocytes Relative: 4 %
Neutro Abs: 4.4 10*3/uL (ref 1.7–7.7)
Neutrophils Relative %: 47 %
Platelets: 292 10*3/uL (ref 150–400)
RBC: 4.89 MIL/uL (ref 3.87–5.11)
RDW: 13.5 % (ref 11.5–15.5)
WBC: 9.3 10*3/uL (ref 4.0–10.5)
nRBC: 0 % (ref 0.0–0.2)

## 2021-01-07 LAB — COMPREHENSIVE METABOLIC PANEL
ALT: 20 U/L (ref 0–44)
AST: 21 U/L (ref 15–41)
Albumin: 3.6 g/dL (ref 3.5–5.0)
Alkaline Phosphatase: 111 U/L (ref 38–126)
Anion gap: 9 (ref 5–15)
BUN: 14 mg/dL (ref 6–20)
CO2: 22 mmol/L (ref 22–32)
Calcium: 9.1 mg/dL (ref 8.9–10.3)
Chloride: 109 mmol/L (ref 98–111)
Creatinine, Ser: 0.95 mg/dL (ref 0.44–1.00)
GFR, Estimated: >60 mL/min (ref >60)
Glucose, Bld: 92 mg/dL (ref 70–99)
Potassium: 3.6 mmol/L (ref 3.5–5.1)
Sodium: 140 mmol/L (ref 135–145)
Total Bilirubin: 0.3 mg/dL (ref 0.3–1.2)
Total Protein: 6.6 g/dL (ref 6.5–8.1)

## 2021-01-07 LAB — I-STAT BETA HCG BLOOD, ED (MC, WL, AP ONLY): I-stat hCG, quantitative: <5 m[IU]/mL (ref <5)

## 2021-01-07 LAB — HCG, QUANTITATIVE, PREGNANCY: hCG, Beta Chain, Quant, S: 2 m[IU]/mL (ref <5)

## 2021-01-07 LAB — TYPE AND SCREEN
ABO/RH(D): O POS
Antibody Screen: NEGATIVE

## 2021-01-07 NOTE — ED Provider Notes (Signed)
[facility] EMERGENCY DEPARTMENT Provider Note   CSN: [id number] Arrival date & time: 01/07/21  7106     History Chief Complaint  Patient presents with   Vaginal Bleeding    Kristen [name] is a 35 y.o. female.  35 year old female with complaint of vaginal bleeding x 9 months. Has been to her GYN who recommended IUD, weight loss. Has not taken any oral meds for bleeding, GYN concerned about weight gain if they tried OCP. Feels fatigue, light headed. No history of anemia. Family hx VonWilbrand. Reports passing clots, using 2 pads a a time, soaking through hourly for the past 2-3 days. Denies possibility of pregnancy, unable to conceive for the past 10 years.      Past Medical History:  Diagnosis Date   Anxiety 2013   treated at [facility] by [facility]    no hospitalization, or intubation, previously on advair and singulair. asthma worsening.    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 , dx in utero    Depression 2001   depressed since childhood    Dysrhythmia 2010   PVC's   Essential hypertension 03/12/2019   GERD (gastroesophageal reflux disease)    History of hiatal hernia    History of suicidal ideation    IBS (irritable bowel syndrome)    Kyphosis of thoracic region 2004    painful, runs in dad side    Migraine    Pancreatitis 2003   gallstone pancreatitis    Pancreatitis due to biliary obstruction 2003   PCOS (polycystic ovarian syndrome) 2014   facial hair, irregular periods, never been pregnant    Sleep apnea    no longer uses cpap    Patient Active Problem List   Diagnosis Date Noted   Weight gain 11/01/2020   Hirsutism 11/01/2020   Gastroesophageal reflux disease without esophagitis 07/11/2020   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   POTS (postural orthostatic tachycardia syndrome) 06/09/2020   Morbid obesity ([place]) 12/14/2019   Endometrial polyp    Moderate episode of recurrent major depressive disorder ([place])  11/18/2019   Encounter for autism screening 11/18/2019   Abnormal uterine bleeding (AUB) 11/08/2019   Severe episode of recurrent major depressive disorder, with psychotic features ([place]) 09/20/2019   PTSD (post-traumatic stress disorder) 09/20/2019   Irritable bowel syndrome with diarrhea 04/23/2019   Abnormal serum thyroid stimulating hormone (TSH) level 04/23/2019   MDD (major depressive disorder), recurrent severe, without psychosis ([place]) 04/15/2019   OSA on CPAP 03/17/2019   Anxiety and depression 03/12/2019   Chronic migraine with aura 03/12/2019   Essential hypertension 03/12/2019   Mild intermittent asthma without complication 26/94/8546   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   Elevated prolactin level 07/05/2016   Vulvar lesion 07/05/2016   Restless legs 05/24/2016   Seasonal allergies 05/13/2014   Intertrigo 05/13/2014   Decreased vision 05/13/2014   PCOS (polycystic ovarian syndrome) 02/07/2014   Social phobia 02/02/2007   ADD 02/02/2007   DYSLEXIA 02/02/2007   Asthma, severe persistent 02/02/2007   SCOLIOSIS 02/02/2007   OTHER SPECIFIED CONGENITAL ANOMALIES OF KIDNEY 02/02/2007   ELECTROCARDIOGRAM, ABNORMAL 02/02/2007    Past Surgical History:  Procedure Laterality Date   BLADDER SURGERY     CHOLECYSTECTOMY  2003    DILATATION & CURETTAGE/HYSTEROSCOPY WITH MYOSURE N/A 12/14/2019   Procedure: DILATATION & CURETTAGE/HYSTEROSCOPY WITH Polypectomy with MYOSURE;  Surgeon: Osborne Oman, MD;  Location: [place];  Service: Gynecology;  Laterality: N/A;  OPEN REDUCTION INTERNAL FIXATION (ORIF) PROXIMAL PHALANX Right 07/23/2013   Procedure: OPEN TREATMENT RIGHT RING FINGER, PROXIMAL INTERPHALANGEAL/JOINT FRACTURE DISLOCATION;  Surgeon: Jolyn Nap, MD;  Location: [place];  Service: Orthopedics;  Laterality: Right;   TONSILLECTOMY     and adenoidectomy     OB History     Gravida  0   Para      Term      Preterm      AB      Living          SAB      IAB      Ectopic      Multiple      Live Births              Family History  Problem Relation Age of Onset   Hypertension Mother    Arnold-Chiari malformation Mother    Restless legs syndrome Mother    Osteoporosis Mother    COPD Mother    Asthma Mother    Squamous cell carcinoma Mother        skin    Eczema Mother    Arthritis Mother    Peripheral Artery Disease Mother    Anxiety disorder Mother    OCD Mother    Colonic polyp Mother    Hypertension Father    Scoliosis Father    Bipolar disorder Father    Congestive Heart Failure Father    COPD Father    Depression Father    Diabetes Maternal Grandmother    Hypertension Maternal Grandmother    Dementia Maternal Grandmother    OCD Maternal Grandmother    Bone cancer Maternal Grandfather 82       bone marrow    Multiple myeloma Maternal Grandfather    Alcohol abuse Maternal Grandfather    Drug abuse Maternal Grandfather    Cancer Paternal Grandmother        colon   Diabetes Paternal Grandfather    Alcohol abuse Paternal Grandfather    Drug abuse Paternal Grandfather    Dementia Paternal Grandfather    Alcohol abuse Maternal Aunt    Depression Maternal Aunt    Pancreatic cancer Maternal Aunt    Alcohol abuse Paternal Aunt    Depression Paternal Aunt    ADD / ADHD Cousin     Social History   Tobacco Use   Smoking status: Never   Smokeless tobacco: Never  Vaping Use   Vaping Use: Never used  Substance Use Topics   Alcohol use: No   Drug use: No    Home Medications Prior to Admission medications   Medication Sig Start Date End Date Taking? Authorizing Provider  albuterol (VENTOLIN HFA) 108 (90 Base) MCG/ACT inhaler Inhale 2 puffs into the lungs every 6 (six) hours as needed for wheezing or shortness of breath. 03/12/19   Ladell Pier, MD  cholestyramine Lucrezia Starch) 4 g packet Take 1 packet (4 g total) by mouth 2 (two) times daily. Take 2 hours before or after all other  medications. 11/21/20   Levin Erp, PA  diclofenac Sodium (VOLTAREN) 1 % GEL APPLY 2 G TOPICALLY 4 (FOUR) TIMES DAILY. 03/24/20 03/24/21  Ladell Pier, MD  gabapentin (NEURONTIN) 300 MG capsule TAKE 1 CAPSULE (300 MG TOTAL) BY MOUTH AT BEDTIME. 03/24/20 03/24/21  Ladell Pier, MD  hydrOXYzine (ATARAX/VISTARIL) 25 MG tablet TAKE 1 TABLET (25 MG TOTAL) BY MOUTH 3 (THREE) TIMES DAILY AS NEEDED FOR ANXIETY. 08/14/20 08/14/21  Eulis Canner E, NP  loperamide (IMODIUM A-D) 2 MG tablet Take 2 mg by mouth 4 (four) times daily as needed for diarrhea or loose stools.    [provider]  LORazepam (ATIVAN) 1 MG tablet Take 1 tablet (1 mg total) by mouth every 8 (eight) hours as needed for anxiety. 08/17/20   Anyanwu, Sallyanne Havers, MD  megestrol (MEGACE) 40 MG tablet Take 2 tablets (80 mg total) by mouth daily. Can increase to two tablets twice a day in the event of heavy bleeding 08/17/20   Anyanwu, Sallyanne Havers, MD  naproxen (NAPROSYN) 500 MG tablet TAKE 1 TABLET (500 MG TOTAL) BY MOUTH 2 (TWO) TIMES DAILY WITH A MEAL. AS NEEDED FOR PAIN 04/19/20 04/19/21  Argentina Donovan, PA-C  omeprazole (PRILOSEC) 20 MG capsule Take 1 capsule (20 mg total) by mouth daily. 07/11/20   Ladell Pier, MD  propranolol (INDERAL) 20 MG tablet Take 1 tablet (20 mg total) by mouth 2 (two) times daily. For anxiety 09/20/19   Salley Slaughter, NP  QUEtiapine (SEROQUEL) 100 MG tablet Take 1.5 tablets (150 mg total) by mouth at bedtime. 11/14/20   Salley Slaughter, NP  sertraline (ZOLOFT) 100 MG tablet Take 1.5 tablets (150 mg total) by mouth at bedtime. 11/14/20   Salley Slaughter, NP  traZODone (DESYREL) 50 MG tablet TAKE 1 TABLET (50 MG TOTAL) BY MOUTH AT BEDTIME AS NEEDED FOR SLEEP. 11/14/20   Salley Slaughter, NP    Allergies    Mucinex [guaifenesin er], Penicillins, Contrast media [iodinated diagnostic agents], Latex, Multihance [gadobenate], and Other  Review of Systems   Review of Systems   Constitutional:  Positive for fatigue.  Respiratory:  Negative for shortness of breath.   Cardiovascular:  Negative for chest pain and palpitations.  Gastrointestinal:  Negative for abdominal pain, nausea and vomiting.  Genitourinary:  Positive for pelvic pain and vaginal bleeding.  Musculoskeletal:  Negative for arthralgias and myalgias.  Skin:  Negative for rash and wound.  Allergic/Immunologic: Negative for immunocompromised state.  Neurological:  Negative for weakness.  Hematological:  Negative for adenopathy.  Psychiatric/Behavioral:  Negative for confusion.   All other systems reviewed and are negative.  Physical Exam Updated Vital Signs BP 118/70 (BP Location: Right Arm)   Pulse 73   Temp 98 F (36.7 C) (Oral)   Resp 20   SpO2 98%   Physical Exam Vitals and nursing note reviewed.  Constitutional:      General: She is not in acute distress.    Appearance: She is well-developed. She is not diaphoretic.  HENT:     Head: Normocephalic and atraumatic.  Eyes:     Conjunctiva/sclera: Conjunctivae normal.  Cardiovascular:     Rate and Rhythm: Normal rate and regular rhythm.     Heart sounds: Normal heart sounds.  Pulmonary:     Effort: Pulmonary effort is normal.     Breath sounds: Normal breath sounds.  Abdominal:     Palpations: Abdomen is soft.     Tenderness: There is no abdominal tenderness.  Skin:    General: Skin is warm and dry.     Findings: No erythema or rash.  Neurological:     Mental Status: She is alert and oriented to person, place, and time.  Psychiatric:        Behavior: Behavior normal.    ED Results / Procedures / Treatments   Labs (all labs ordered are listed, but only abnormal results are displayed) Labs Reviewed  COMPREHENSIVE METABOLIC PANEL  CBC WITH DIFFERENTIAL/PLATELET  HCG, QUANTITATIVE, PREGNANCY  I-STAT BETA HCG BLOOD, ED ([facility], [facility], [facility] ONLY)  TYPE AND SCREEN  ABO/RH    EKG None  Radiology US PELVIC COMPLETE WITH  TRANSVAGINAL  Result Date: 01/07/2021 CLINICAL DATA:  Vaginal bleeding. EXAM: TRANSABDOMINAL AND TRANSVAGINAL ULTRASOUND OF PELVIS TECHNIQUE: Both transabdominal and transvaginal ultrasound examinations of the pelvis were performed. Transabdominal technique was performed for global imaging of the pelvis including uterus, ovaries, adnexal regions, and pelvic cul-de-sac. It was necessary to proceed with endovaginal exam following the transabdominal exam to visualize the endometrium. COMPARISON:  None FINDINGS: Uterus Measurements: 8.0 x 4.2 x 4.7 cm = volume: 83 mL. No fibroids or other mass visualized. Endometrium Thickness: 6-7 mm.  No focal abnormality visualized. Right ovary Measurements: 3.9 x 2 x 3 x 3.5 cm = volume: 16.6 mL. 3.2 cm simple cystic lesion likely dominant follicle. Left ovary Measurements: 2.6 x 1.6 x 2.3 cm = volume: 5 mL. Normal appearance/no adnexal mass. Other findings No abnormal free fluid. IMPRESSION: 1. No acute findings. No findings to explain the patient's history of vaginal bleeding. 2. 3.2 cm simple cystic lesion in the right ovary, likely dominant follicle. This has benign characteristics and is a common finding in premenopausal females. No imaging follow up is required. Electronically Signed   By: Misty Stanley M.D.   On: 01/07/2021 09:10    Procedures Procedures   Medications Ordered in ED Medications - No data to display  ED Course  I have reviewed the triage vital signs and the nursing notes.  Pertinent labs & imaging results that were available during my care of the patient were reviewed by me and considered in my medical decision making (see chart for details).  Clinical Course as of 01/07/21 1336  Sun Jan 08, 7627  7643 35 year old female presents with complaint of heavy vaginal bleeding.  Reports onset of bleeding in February of this year, has been constant and daily since.  Patient has been going to her gynecologist, did try Megace but reports this did not  work.  Patient has been recommended to have an IUD however she is uncomfortable with this decision due to concerns for Ehlers-Danlos as well as family members who have had difficulty with their IUDs.  Patient is also concerned she may have von Willebrand's disease due to family history of same. Reports bleeding through 2 pads per hour for the past few days. Vitals are reviewed patient is monitored throughout her stay in the department and are stable, her blood pressure is stable, she is not tachycardic.  Patient's labs are reviewed, she is not anemic, hemoglobin today is 13.5, on trend with hemoglobins which have been recorded on file since June of this year.  Her CMP was in normal limits.  hCG is negative. Ultrasound shows endometrium of 7 mm, no other acute findings. On chart review.  Patient has been seen by hematology, had a negative work-up, does not have underlying coagulopathy. Discussed with Dr. Jeanell Sparrow, recommends trial of Megace again and follow-up with her gynecologist. [LM]    Clinical Course User Index [LM] Roque Lias   MDM Rules/Calculators/A&P                           Final Clinical Impression(s) / ED Diagnoses Final diagnoses:  Abnormal uterine bleeding (AUB)    Rx / DC Orders ED Discharge Orders     None  Tacy Learn, PA-C 01/07/21 1336    Pattricia Boss, MD 01/09/21 1224

## 2021-01-07 NOTE — Discharge Instructions (Signed)
Follow up with your GYN. Recommend Megace as previously prescribed (refills available at your pharmacy).

## 2021-01-07 NOTE — ED Provider Notes (Signed)
Emergency Medicine Provider Triage Evaluation Note  Alaura Schippers , a 35 y.o. female  was evaluated in triage.  Pt complains of vaginal bleeding x 9 months. Has been to her GYN who recommended IUD, weight loss. Has not taken any oral meds for bleeding, GYN concerned about weight gain if they tried OCP. Feels fatigue, light headed. No history of anemia. Family hx VonWilbrand. Reports passing clots, using 2 pads a a time, soaking through hourly for the past 2-3 days.   Review of Systems  Positive: Vaginal bleeding, cramping, fatigue Negative: syncope  Physical Exam   Gen:   Awake, no distress   Resp:  Normal effort  MSK:   Moves extremities without difficulty  Other:  HR RRR  Medical Decision Making  Medically screening exam initiated at 7:23 AM.  Appropriate orders placed.  Neelam Tiggs was informed that the remainder of the evaluation will be completed by another provider, this initial triage assessment does not replace that evaluation, and the importance of remaining in the ED until their evaluation is complete.     Jeannie Fend, PA-C 01/07/21 0761    Margarita Grizzle, MD 01/09/21 1224

## 2021-01-07 NOTE — ED Notes (Signed)
Phlebotomy at bedside getting I-Stat.  Coke provided per request.

## 2021-01-07 NOTE — ED Notes (Signed)
Pt in [facility]. Tech to transport pt to treatment room when finished with testing.

## 2021-01-07 NOTE — ED Triage Notes (Signed)
Reports vaginal bleeding since March that has been worse since 12/1 with clots and abd cramping.  2 pads in 1 hr.

## 2021-01-30 ENCOUNTER — Ambulatory Visit (INDEPENDENT_AMBULATORY_CARE_PROVIDER_SITE_OTHER): Payer: No Payment, Other | Admitting: Licensed Clinical Social Worker

## 2021-01-30 DIAGNOSIS — F4310 Post-traumatic stress disorder, unspecified: Secondary | ICD-10-CM | POA: Diagnosis not present

## 2021-01-30 NOTE — Progress Notes (Signed)
THERAPIST PROGRESS NOTE   Virtual Visit via Video Note  I connected with Kristen [name] on 01/30/21 at  9:00 AM EST by a video enabled telemedicine application and verified that I am speaking with the correct person using two identifiers.  Location: Patient: Home Provider: [facility]   I discussed the limitations of evaluation and management by telemedicine and the availability of in person appointments. The patient expressed understanding and agreed to proceed. I discussed the assessment and treatment plan with the patient. The patient was provided an opportunity to ask questions and all were answered. The patient agreed with the plan and demonstrated an understanding of the instructions.   The patient was advised to call back or seek an in-person evaluation if the symptoms worsen or if the condition fails to improve as anticipated.  I provided 25 minutes of non-face-to-face time during this encounter.  Participation Level: Minimal  Behavioral Response: CasualLethargicDepressed  Type of Therapy: Individual Therapy  Treatment Goals addressed: Communication: dep/anx/coping  Interventions: Solution Focused and Other: additional assessment  Summary: Kristen [name] is a 35 y.o. female who presents with hx of PTSD.  Today administrative staff report patient called this morning to cancel her appointment.  They spoke with her about changing to video which Lonya agreed to.  Patient logs on for video session.  She reports she was going to cancel because she is not feeling well.  Arla states she did not sleep good last night but in general has been sleeping well overall.  She reports fatigue, her mood is noted to be low with a low, slow voice tone, limited eye contact.  Patient reports she is taking medications as prescribed.  She states her anxiety and depression are both "5-6" on a scale of 0-10.  [name] continues to live with her parents and provide care for both of  them, no significant changes.  LCSW assessed for her exploration of the Alzheimer's website and the family caregiver alliance website.  Patient states she did look at the Alzheimer's Association website and states intent to still look at the family caregiver alliance website.  Additional education provided on both sites.  LCSW assessed for patient's coping strategies.  Patient reports she tries to distract herself by drawing or painting.  LCSW assessed for patient engaging in an exercise program.  Patient reports she has "given up" on trying to exercise.  She states she exercised for a year and changed her diet to a vegetarian diet and only lost 7 pounds in a year.  LCSW addressed the benefits of exercise r/t mood and small obtainable goals since patient has not exercised in some time.  Patient states willingness to use her calendar to add exercise appointments 2 times a week for 15 minutes with goal to increase as able.  LCSW provided education on using daily calm for guided meditation which patient also states she will try.  LCSW assessed for status of relationship with fianc.  Patient states "pretty good".  She reports her fianc found work and this has been helpful.  LCSW recognized patient's upcoming birthday.  She reports when she was little there were some celebrations but now it is "just another day".  Patient denies other worries or concerns to address today. LCSW reviewed poc including scheduling prior to close of session. Pt states appreciation for care.   Suicidal/Homicidal: Nowithout intent/plan  Therapist Response: Pt somewhat receptive to care.  Plan: Return again in 2 weeks.  Diagnosis: Axis I: Post Traumatic Stress Disorder  [place] Sink, LCSW 01/30/2021

## 2021-02-01 NOTE — Progress Notes (Signed)
Reviewed and agree with documentation and assessment and plan. K. Veena Draden Cottingham , MD   

## 2021-02-13 ENCOUNTER — Other Ambulatory Visit: Payer: Self-pay

## 2021-02-13 ENCOUNTER — Ambulatory Visit (INDEPENDENT_AMBULATORY_CARE_PROVIDER_SITE_OTHER): Payer: No Payment, Other | Admitting: Licensed Clinical Social Worker

## 2021-02-13 DIAGNOSIS — F4310 Post-traumatic stress disorder, unspecified: Secondary | ICD-10-CM

## 2021-02-13 NOTE — Progress Notes (Signed)
Reviewed and agree with documentation and assessment and plan. K. Veena Aysha Livecchi , MD   

## 2021-02-14 NOTE — Progress Notes (Signed)
° °  THERAPIST PROGRESS NOTE  Session Time: 46 min  Participation Level: Active  Behavioral Response: CasualAlertAnxious and Depressed  Type of Therapy: Individual Therapy  Treatment Goals addressed: Communication: dep/anx/coping  Interventions: CBT, Supportive, and Reframing  Summary: Kristen [name] is a 36 y.o. female who presents with hx of PTSD.  Today patient comes for in person session.  Patient drove self to appointment and reports she is able to leave her parents alone for short periods of time.  LCSW assessed for status of patient taking medications.  Patient reports she is taking medications but over the past 2 weeks has not been able to sleep well even with trazodone.  Patient has med management appointment in 3 days and will discuss concerns with medication management provider.  Today patient provides details about her physical health struggles.  She reports significant hormone related problems and GYN problems.  Patient provides numerous details and states she does not feel heard by her OB/GYN.  Patient is encouraged to look at provider options.  Today patient states "I feel like all I am seen for is being fat".  LCSW facilitated discussion of self-esteem, weight management, and additional information related to childhood.  Patient reports as a child she was extremely active and in multiple sports.  She states once she hit puberty she started gaining weight and this has been a compounding problem since, regardless of what she has tried to do to control her weight.  Patient was given negative messaging from both parents and both sets of grandparents. Pt states "I lost who I am".  Patient reports she has always been "socially awkward" and is very easily overstimulated.  Patient reports that as a child she did not like to be held or touched and she continues to experience this feeling in adulthood.  When asked, she states her fianc is understanding and also demisexual.  Patient  confirms very low self-esteem and very regular feelings of guilt.  LCSW provided information on real and false guilt.  Provided education on the power of self talk including literature for patient to take home.  Provided education on CBT. Patient agrees to make notes of her self talk to bring to next session. Pt reports she did follow recommendation to exercise 2xwk which LCSW commended. LCSW reviewed poc including scheduling prior to close of session. Pt states appreciation for care.   Suicidal/Homicidal: Nowithout intent/plan  Therapist Response: Pt receptive to care.  Plan: Return again in ~2 weeks.  Diagnosis: Axis I: Post Traumatic Stress Disorder   [place] Sink, LCSW 02/14/2021

## 2021-02-16 ENCOUNTER — Other Ambulatory Visit: Payer: Self-pay

## 2021-02-16 ENCOUNTER — Encounter (HOSPITAL_COMMUNITY): Payer: Self-pay | Admitting: Psychiatry

## 2021-02-16 ENCOUNTER — Telehealth (INDEPENDENT_AMBULATORY_CARE_PROVIDER_SITE_OTHER): Payer: No Payment, Other | Admitting: Psychiatry

## 2021-02-16 DIAGNOSIS — F333 Major depressive disorder, recurrent, severe with psychotic symptoms: Secondary | ICD-10-CM

## 2021-02-16 DIAGNOSIS — F4010 Social phobia, unspecified: Secondary | ICD-10-CM

## 2021-02-16 MED ORDER — BREXPIPRAZOLE 1 MG PO TABS
1.0000 mg | ORAL_TABLET | Freq: Every day | ORAL | 3 refills | Status: DC
  Filled 2021-02-16: qty 30, 30d supply, fill #0

## 2021-02-16 MED ORDER — SERTRALINE HCL 100 MG PO TABS
150.0000 mg | ORAL_TABLET | Freq: Every day | ORAL | 3 refills | Status: DC
  Filled 2021-02-16: qty 45, 30d supply, fill #0

## 2021-02-16 MED ORDER — TRAZODONE HCL 100 MG PO TABS
100.0000 mg | ORAL_TABLET | Freq: Every evening | ORAL | 3 refills | Status: DC | PRN
  Filled 2021-02-16: qty 30, 30d supply, fill #0

## 2021-02-16 MED ORDER — HYDROXYZINE HCL 25 MG PO TABS
ORAL_TABLET | Freq: Three times a day (TID) | ORAL | 3 refills | Status: DC | PRN
  Filled 2021-02-16: qty 90, 30d supply, fill #0

## 2021-02-16 NOTE — Progress Notes (Signed)
[facility] MD/PA/NP OP Progress Note Virtual Visit via Video Note  I connected with Kristen [name] on 02/16/21 at  9:30 AM EST by a video enabled telemedicine application and verified that I am speaking with the correct person using two identifiers.  Location: Patient: Home Provider: Clinic   I discussed the limitations of evaluation and management by telemedicine and the availability of in person appointments. The patient expressed understanding and agreed to proceed.  I provided 30 minutes of non-face-to-face time during this encounter.    02/16/2021 10:33 AM Kristen [name]  MRN:  [id number]  Chief Complaint: "I have been having a hard time sleeping"  HPI:  37 year old female seen today for follow up psychiatric evaluation.  She has a psychiatric history of social phobia, ADD, anxiety, depression, and PTSD.  She is currently being managed on Seroquel 100 mg as needed, Zoloft 150 mg daily, trazodone 50 mg nightly as needed, BuSpar 15 mg 3 times daily, hydroxyzine 25 mg 3 times a day as needed, gabapentin 300 mg nightly (prescribed by PCP), and propanolol 20 mg 2 times daily (recives from PCP).  She notes that her medications are somewhat effective in managing her psychiatric conditions.    Today patient's camera was turned off.  During exam she was pleasant, cooperative, and engaged in conversation.  She informed provider that she has not been sleeping well. She also notes that she is frustrated with her health. She notes that she is over 89 with multiple health comorbidity (IBS, EDS, polycystic ovarian disease, and notes that she has bleeding for a year). She notes that she is followed by  an OBGYN and notes that she was recommended to have an IUD. She reports that she is skeptical about this as other family members have had complications. Patient notes that she wants to have bariatric surgery but notes that her insurance will not cover it. She reports that at times her wight is  overwhelming as she does diet and exercises. She notes that she is frustrated with doctors who continuously tell her to lose weight when she is actively trying.   Patient also notes that she is stressed about finances. She reports that she continue to wait on her disability to be approved. She also notes that her fiance found a new position but took a pay cut. She continues to care for her mother and her fathers who has varying health conditions. Because she cares for her parents she notes that she is isolated from friends. Patient reports that these stressors worsen her anxiety and depression.  Provider conducted a GAD-7 and patient scored 16, at her last visit she scored a 19.  Provider also conducted PHQ-9 and patient scored a 20, at her last visit she scored a 21.  Today she denies SI/HI/VH.  Patient notes that she occassionally sees shadows and bugs.  She also notes that she feels paranoid that someone is watching her. She reports being more irritable and distracted but denies other symptoms of mania.  Today patient is agreeable to increasing Trazodone 50 mg to 100 mg to help manage sleep. Patient also agreeable to starting Rexulti 1 mg to help with mood and symptoms of psychosis. She will discontinue Seroquel as she finds it ineffective and dislikes increased weight gain. She will continue all other medications as prescribed.  No other concerns at this time.    Visit Diagnosis:    ICD-10-CM   1. Severe episode of recurrent major depressive disorder, with psychotic features (  HCC)  F33.3 brexpiprazole (REXULTI) 1 MG TABS tablet    traZODone (DESYREL) 100 MG tablet    sertraline (ZOLOFT) 100 MG tablet    2. Social phobia  F40.10 traZODone (DESYREL) 100 MG tablet    sertraline (ZOLOFT) 100 MG tablet    hydrOXYzine (ATARAX) 25 MG tablet      Past Psychiatric History: social phobia, ADD, anxiety, depression, and PTSD.  Past Medical History:  Past Medical History:  Diagnosis Date   Anxiety  2013   treated at [facility] by [place]    no hospitalization, or intubation, previously on advair and singulair. asthma worsening.    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 , dx in utero    Depression 2001   depressed since childhood    Dysrhythmia 2010   PVC's   Essential hypertension 03/12/2019   GERD (gastroesophageal reflux disease)    History of hiatal hernia    History of suicidal ideation    IBS (irritable bowel syndrome)    Kyphosis of thoracic region 2004    painful, runs in dad side    Migraine    Pancreatitis 2003   gallstone pancreatitis    Pancreatitis due to biliary obstruction 2003   PCOS (polycystic ovarian syndrome) 2014   facial hair, irregular periods, never been pregnant    Sleep apnea    no longer uses cpap    Past Surgical History:  Procedure Laterality Date   BLADDER SURGERY     CHOLECYSTECTOMY  2003    DILATATION & CURETTAGE/HYSTEROSCOPY WITH MYOSURE N/A 12/14/2019   Procedure: DILATATION & CURETTAGE/HYSTEROSCOPY WITH Polypectomy with MYOSURE;  Surgeon: Osborne Oman, MD;  Location: [place];  Service: Gynecology;  Laterality: N/A;   OPEN REDUCTION INTERNAL FIXATION (ORIF) PROXIMAL PHALANX Right 07/23/2013   Procedure: OPEN TREATMENT RIGHT RING FINGER, PROXIMAL INTERPHALANGEAL/JOINT FRACTURE DISLOCATION;  Surgeon: Jolyn Nap, MD;  Location: [place];  Service: Orthopedics;  Laterality: Right;   TONSILLECTOMY     and adenoidectomy    Family Psychiatric History: Mother PTSD, Father Bipolar disorder and PTSD  Family History:  Family History  Problem Relation Age of Onset   Hypertension Mother    Arnold-Chiari malformation Mother    Restless legs syndrome Mother    Osteoporosis Mother    COPD Mother    Asthma Mother    Squamous cell carcinoma Mother        skin    Eczema Mother    Arthritis Mother    Peripheral Artery Disease Mother    Anxiety disorder Mother    OCD Mother    Colonic polyp Mother     Hypertension Father    Scoliosis Father    Bipolar disorder Father    Congestive Heart Failure Father    COPD Father    Depression Father    Diabetes Maternal Grandmother    Hypertension Maternal Grandmother    Dementia Maternal Grandmother    OCD Maternal Grandmother    Bone cancer Maternal Grandfather 82       bone marrow    Multiple myeloma Maternal Grandfather    Alcohol abuse Maternal Grandfather    Drug abuse Maternal Grandfather    Cancer Paternal Grandmother        colon   Diabetes Paternal Grandfather    Alcohol abuse Paternal Grandfather    Drug abuse Paternal Grandfather    Dementia Paternal Grandfather    Alcohol abuse Maternal Aunt  Depression Maternal Aunt    Pancreatic cancer Maternal Aunt    Alcohol abuse Paternal Aunt    Depression Paternal Aunt    ADD / ADHD Cousin     Social History:  Social History   Socioeconomic History   Marital status: Single    Spouse name: Not on file   Number of children: 0   Years of education: college    Highest education level: Not on file  Occupational History   Occupation: Unemployed   Tobacco Use   Smoking status: Never   Smokeless tobacco: Never  Vaping Use   Vaping Use: Never used  Substance and Sexual Activity   Alcohol use: No   Drug use: No   Sexual activity: Yes    Birth control/protection: None  Other Topics Concern   Not on file  Social History Narrative   Lives with mom [name])    Right-handed   Caffeine: 3 cups of coffee per day   Social Determinants of Health   Financial Resource Strain: Not on file  Food Insecurity: Food Insecurity Present   Worried About Charity fundraiser in the Last Year: Often true   Arboriculturist in the Last Year: Sometimes true  Transportation Needs: No Transportation Needs   Lack of Transportation (Medical): No   Lack of Transportation (Non-Medical): No  Physical Activity: Not on file  Stress: Not on file  Social Connections: Not on file    Allergies:   Allergies  Allergen Reactions   Mucinex [Guaifenesin Er] Hives, Itching and Swelling   Penicillins Swelling    Did it involve swelling of the face/tongue/throat, SOB, or low BP? Yes Did it involve sudden or severe rash/hives, skin peeling, or any reaction on the inside of your mouth or nose? No Did you need to seek medical attention at a hospital or doctor's office? Yes When did it last happen?      >10 years ago If all above answers are NO, may proceed with cephalosporin use.    Contrast Media [Iodinated Contrast Media] Nausea And Vomiting   Latex Hives and Swelling   Multihance [Gadobenate] Nausea And Vomiting   Other     Pickles - throat swelling and nausea     Metabolic Disorder Labs: Lab Results  Component Value Date   HGBA1C 4.9 04/15/2019   MPG 93.93 04/15/2019   Lab Results  Component Value Date   PROLACTIN 15.3 11/01/2020   PROLACTIN 20.5 07/14/2020   Lab Results  Component Value Date   CHOL 204 (H) 04/15/2019   TRIG 146 04/15/2019   HDL 41 04/15/2019   CHOLHDL 5.0 04/15/2019   VLDL 29 04/15/2019   LDLCALC 134 (H) 04/15/2019   Lab Results  Component Value Date   TSH 2.99 11/01/2020   TSH 4.628 (H) 04/15/2019    Therapeutic Level Labs: No results found for: LITHIUM No results found for: VALPROATE No components found for:  CBMZ  Current Medications: Current Outpatient Medications  Medication Sig Dispense Refill   brexpiprazole (REXULTI) 1 MG TABS tablet Take 1 tablet (1 mg total) by mouth daily. 30 tablet 3   albuterol (VENTOLIN HFA) 108 (90 Base) MCG/ACT inhaler Inhale 2 puffs into the lungs every 6 (six) hours as needed for wheezing or shortness of breath. 6.7 g 3   cholestyramine (QUESTRAN) 4 g packet Take 1 packet (4 g total) by mouth 2 (two) times daily. Take 2 hours before or after all other medications. 60 each 3   diclofenac  Sodium (VOLTAREN) 1 % GEL APPLY 2 G TOPICALLY 4 (FOUR) TIMES DAILY. 100 g 1   gabapentin (NEURONTIN) 300 MG capsule TAKE  1 CAPSULE (300 MG TOTAL) BY MOUTH AT BEDTIME. 30 capsule 3   hydrOXYzine (ATARAX) 25 MG tablet TAKE 1 TABLET (25 MG TOTAL) BY MOUTH 3 (THREE) TIMES DAILY AS NEEDED FOR ANXIETY. 90 tablet 3   loperamide (IMODIUM A-D) 2 MG tablet Take 2 mg by mouth 4 (four) times daily as needed for diarrhea or loose stools.     LORazepam (ATIVAN) 1 MG tablet Take 1 tablet (1 mg total) by mouth every 8 (eight) hours as needed for anxiety. 5 tablet 0   megestrol (MEGACE) 40 MG tablet Take 2 tablets (80 mg total) by mouth daily. Can increase to two tablets twice a day in the event of heavy bleeding 60 tablet 5   naproxen (NAPROSYN) 500 MG tablet TAKE 1 TABLET (500 MG TOTAL) BY MOUTH 2 (TWO) TIMES DAILY WITH A MEAL. AS NEEDED FOR PAIN 60 tablet 1   omeprazole (PRILOSEC) 20 MG capsule Take 1 capsule (20 mg total) by mouth daily. 30 capsule 3   propranolol (INDERAL) 20 MG tablet Take 1 tablet (20 mg total) by mouth 2 (two) times daily. For anxiety 60 tablet 2   sertraline (ZOLOFT) 100 MG tablet Take 1.5 tablets (150 mg total) by mouth at bedtime. 45 tablet 3   traZODone (DESYREL) 100 MG tablet Take 1 tablet (100 mg total) by mouth at bedtime as needed for sleep. 30 tablet 3   No current facility-administered medications for this visit.     Musculoskeletal: Strength & Muscle Tone:  Unable to assess due to telehealth visit, camera turned off Gait & Station:  Unable to assess due to telehealth visit, camera turned off Patient leans: N/A  Psychiatric Specialty Exam: Review of Systems  There were no vitals taken for this visit.There is no height or weight on file to calculate BMI.  General Appearance:  Unable to assess due to telehealth visit, camera turned off  Eye Contact:   Unable to assess due to telehealth visit, camera turned off  Speech:  Clear and Coherent and Normal Rate  Volume:  Normal  Mood:  Anxious and Depressed  Affect:  Congruent  Thought Process:  Coherent, Goal Directed and Linear  Orientation:   Full (Time, Place, and Person)  Thought Content: Logical, Hallucinations: Visual, and Paranoid Ideation   Suicidal Thoughts:  No  Homicidal Thoughts:  No  Memory:  Immediate;   Good Recent;   Good Remote;   Good  Judgement:  Good  Insight:  Good  Psychomotor Activity:   Unable to assess due to telehealth visit, camera turned off  Concentration:  Concentration: Good and Attention Span: Good  Recall:  Good  Fund of Knowledge: Good  Language: Good  Akathisia:   Unable to assess due to telehealth visit, camera turned off  Handed:  Right  AIMS (if indicated): Not done  Assets:  Communication Skills Desire for Improvement Financial Resources/Insurance Housing Social Support  ADL's:  Intact  Cognition: WNL  Sleep:  Fair   Screenings: AIMS    Flowsheet Row Admission (Discharged) from OP Visit from 04/14/2019 in [facility]  AIMS Total Score 0      AUDIT    Flowsheet Row Admission (Discharged) from OP Visit from 04/14/2019 in [facility]  Alcohol Use Disorder Identification Test Final Score (AUDIT) 0  GAD-7    Flowsheet Row Video Visit from 02/16/2021 in [facility] Video Visit from 11/14/2020 in [facility] Office Visit from 08/17/2020 in [facility] [place] [facility] Video Visit from 08/14/2020 in [facility] Office Visit from 07/11/2020 in [place]  Total GAD-7 Score _0 PHQ2-9    Flowsheet Row Video Visit from 02/16/2021 in [facility] Counselor from 12/13/2020 in [facility] Video Visit from 11/14/2020 in [facility] Office Visit from 08/17/2020 in [facility] for [place] [facility] Video Visit from 08/14/2020 in [place]  PHQ-2 Total Score _1 PHQ-9 Total Score _2 Flowsheet Row ED from 01/07/2021 in [place] Counselor from 12/13/2020 in [facility] Video Visit from 11/14/2020 in [place] No Risk Low Risk Error: Q7 should not be populated when Q6 is No        Assessment and Plan: Patient endorses symtoms of anxiety, depression, paranoia, and VH. Today patient is agreeable to increasing Trazodone 50 mg to 100 mg to help manage sleep. Patient also agreeable to starting Rexulti 1 mg to help with mood and symptoms of psychosis. She will discontinue Seroquel as she finds it ineffective and dislikes increased weight gain. She will continue all other medications as prescribed.   1. Severe episode of recurrent major depressive disorder, with psychotic features ([place])  Start- brexpiprazole (REXULTI) 1 MG TABS tablet; Take 1 tablet (1 mg total) by mouth daily.  Dispense: 30 tablet; Refill: 3 Increased- traZODone (DESYREL) 100 MG tablet; Take 1 tablet (100 mg total) by mouth at bedtime as needed for sleep.  Dispense: 30 tablet; Refill: 3 Continue- sertraline (ZOLOFT) 100 MG tablet; Take 1.5 tablets (150 mg total) by mouth at bedtime.  Dispense: 45 tablet; Refill: 3  2. Social phobia  Increased- traZODone (DESYREL) 100 MG tablet; Take 1 tablet (100 mg total) by mouth at bedtime as needed for sleep.  Dispense: 30 tablet; Refill: 3 Continue- sertraline (ZOLOFT) 100 MG tablet; Take 1.5 tablets (150 mg total) by mouth at bedtime.  Dispense: 45 tablet; Refill: 3 Continue- hydrOXYzine (ATARAX) 25 MG tablet; TAKE 1 TABLET (25 MG TOTAL) BY MOUTH 3 (THREE) TIMES DAILY AS NEEDED FOR ANXIETY.  Dispense: 90 tablet; Refill: 3     Follow-up in 3 months Follow-up therapy Salley Slaughter, NP 02/16/2021, 10:33 AM

## 2021-02-22 ENCOUNTER — Other Ambulatory Visit: Payer: Self-pay

## 2021-02-26 ENCOUNTER — Ambulatory Visit: Payer: Self-pay | Admitting: Physician Assistant

## 2021-02-28 ENCOUNTER — Ambulatory Visit (HOSPITAL_COMMUNITY): Payer: Self-pay | Admitting: Licensed Clinical Social Worker

## 2021-03-12 ENCOUNTER — Ambulatory Visit (INDEPENDENT_AMBULATORY_CARE_PROVIDER_SITE_OTHER): Payer: Self-pay | Admitting: Physician Assistant

## 2021-03-12 ENCOUNTER — Other Ambulatory Visit: Payer: Self-pay

## 2021-03-12 ENCOUNTER — Other Ambulatory Visit (INDEPENDENT_AMBULATORY_CARE_PROVIDER_SITE_OTHER): Payer: Self-pay

## 2021-03-12 ENCOUNTER — Telehealth: Payer: Self-pay | Admitting: Internal Medicine

## 2021-03-12 ENCOUNTER — Encounter: Payer: Self-pay | Admitting: Physician Assistant

## 2021-03-12 VITALS — BP 130/70 | HR 90 | Resp 18 | Ht 66.5 in | Wt 319.6 lb

## 2021-03-12 DIAGNOSIS — K529 Noninfective gastroenteritis and colitis, unspecified: Secondary | ICD-10-CM

## 2021-03-12 LAB — SEDIMENTATION RATE: Sed Rate: 28 mm/hr — ABNORMAL HIGH (ref 0–20)

## 2021-03-12 LAB — C-REACTIVE PROTEIN: CRP: <1 mg/dL (ref 0.5–20.0)

## 2021-03-12 MED ORDER — DICYCLOMINE HCL 10 MG PO CAPS
ORAL_CAPSULE | ORAL | 2 refills | Status: DC
  Filled 2021-03-12: qty 90, 30d supply, fill #0

## 2021-03-12 NOTE — Progress Notes (Signed)
Subjective:    Patient ID: Kristen [name], female    DOB: August 09, 1985, 36 y.o.   MRN: [id number]  HPI Kristen [name] is a 36 year old white female, established with Dr. Silverio [name], who had been seen by Kristen Newer, PA-C, with last office visit in November 2022.  She had primarily had complaints of IBS type symptoms and diarrhea.  She is status post remote cholecystectomy at age 36 and says she has had some issues with loose stools ever since then.  She had been given a trial of cholestyramine 4 g twice daily but when she was last seen in November stated that she was only taking it once a day because she was having a difficult time with the texture.  She was asked to increase to twice a day. She did have GI path panel done, stool for O&P, and fecal calprotectin all in October 2022, and all negative. She comes in today stating that she is trying to take the Questran twice a day, sometimes she is able to get into doses, other days just once a day.  Even with the Questran she is having 4-8 loose bowel movements per day.  She also describes cramping and discomfort postprandially, worse with dairy and salads.  She has ongoing gassiness and very urgent stools postprandially which she says is affecting her life because she always has to know where the bathroom is and will usually need to bathroom within about 10 minutes of eating.  She is also been taking Imodium as needed usually 2/day. Other diagnoses include migraines, POTS, sleep apnea, morbid obesity, GERD, PCOS, PTSD, and depression.  Review of Systems Pertinent positive and negative review of systems were noted in the above HPI section.  All other review of systems was otherwise negative.   Outpatient Encounter Medications as of 03/12/2021  Medication Sig   albuterol (VENTOLIN HFA) 108 (90 Base) MCG/ACT inhaler Inhale 2 puffs into the lungs every 6 (six) hours as needed for wheezing or shortness of breath.   brexpiprazole (REXULTI) 1 MG TABS tablet  Take 1 tablet (1 mg total) by mouth daily.   cholestyramine (QUESTRAN) 4 g packet Take 1 packet (4 g total) by mouth 2 (two) times daily. Take 2 hours before or after all other medications.   diclofenac Sodium (VOLTAREN) 1 % GEL APPLY 2 G TOPICALLY 4 (FOUR) TIMES DAILY.   dicyclomine (BENTYL) 10 MG capsule Take 1 capsule by mouth 1 hour before meals as needed for diarrhea   gabapentin (NEURONTIN) 300 MG capsule TAKE 1 CAPSULE (300 MG TOTAL) BY MOUTH AT BEDTIME.   hydrOXYzine (ATARAX) 25 MG tablet TAKE 1 TABLET (25 MG TOTAL) BY MOUTH 3 (THREE) TIMES DAILY AS NEEDED FOR ANXIETY.   loperamide (IMODIUM A-D) 2 MG tablet Take 2 mg by mouth 4 (four) times daily as needed for diarrhea or loose stools.   LORazepam (ATIVAN) 1 MG tablet Take 1 tablet (1 mg total) by mouth every 8 (eight) hours as needed for anxiety.   megestrol (MEGACE) 40 MG tablet Take 2 tablets (80 mg total) by mouth daily. Can increase to two tablets twice a day in the event of heavy bleeding   naproxen (NAPROSYN) 500 MG tablet TAKE 1 TABLET (500 MG TOTAL) BY MOUTH 2 (TWO) TIMES DAILY WITH A MEAL. AS NEEDED FOR PAIN   omeprazole (PRILOSEC) 20 MG capsule Take 1 capsule (20 mg total) by mouth daily.   propranolol (INDERAL) 20 MG tablet Take 1 tablet (20 mg total) by mouth 2 (two)  times daily. For anxiety   sertraline (ZOLOFT) 100 MG tablet Take 1.5 tablets (150 mg total) by mouth at bedtime.   traZODone (DESYREL) 100 MG tablet Take 1 tablet (100 mg total) by mouth at bedtime as needed for sleep.   No facility-administered encounter medications on file as of 03/12/2021.   Allergies  Allergen Reactions   Mucinex [Guaifenesin Er] Hives, Itching and Swelling   Penicillins Swelling    Did it involve swelling of the face/tongue/throat, SOB, or low BP? Yes Did it involve sudden or severe rash/hives, skin peeling, or any reaction on the inside of your mouth or nose? No Did you need to seek medical attention at a hospital or doctor's office?  Yes When did it last happen?      >10 years ago If all above answers are NO, may proceed with cephalosporin use.    Contrast Media [Iodinated Contrast Media] Nausea And Vomiting   Latex Hives and Swelling   Multihance [Gadobenate] Nausea And Vomiting   Other     Pickles - throat swelling and nausea    Patient Active Problem List   Diagnosis Date Noted   Weight gain 11/01/2020   Hirsutism 11/01/2020   Gastroesophageal reflux disease without esophagitis 07/11/2020   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   POTS (postural orthostatic tachycardia syndrome) 06/09/2020   Morbid obesity ([place]) 12/14/2019   Endometrial polyp    Moderate episode of recurrent major depressive disorder ([place]) 11/18/2019   Encounter for autism screening 11/18/2019   Abnormal uterine bleeding (AUB) 11/08/2019   Severe episode of recurrent major depressive disorder, with psychotic features ([place]) 09/20/2019   PTSD (post-traumatic stress disorder) 09/20/2019   Irritable bowel syndrome with diarrhea 04/23/2019   Abnormal serum thyroid stimulating hormone (TSH) level 04/23/2019   MDD (major depressive disorder), recurrent severe, without psychosis ([place]) 04/15/2019   OSA on CPAP 03/17/2019   Anxiety and depression 03/12/2019   Chronic migraine with aura 03/12/2019   Essential hypertension 03/12/2019   Mild intermittent asthma without complication 29/24/4628   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   Elevated prolactin level 07/05/2016   Vulvar lesion 07/05/2016   Restless legs 05/24/2016   Seasonal allergies 05/13/2014   Intertrigo 05/13/2014   Decreased vision 05/13/2014   PCOS (polycystic ovarian syndrome) 02/07/2014   Social phobia 02/02/2007   ADD 02/02/2007   DYSLEXIA 02/02/2007   Asthma, severe persistent 02/02/2007   SCOLIOSIS 02/02/2007   OTHER SPECIFIED CONGENITAL ANOMALIES OF KIDNEY 02/02/2007   ELECTROCARDIOGRAM, ABNORMAL 02/02/2007   Social History   Socioeconomic History   Marital status:  Single    Spouse name: Not on file   Number of children: 0   Years of education: college    Highest education level: Not on file  Occupational History   Occupation: Unemployed   Tobacco Use   Smoking status: Never   Smokeless tobacco: Never  Vaping Use   Vaping Use: Never used  Substance and Sexual Activity   Alcohol use: No   Drug use: No   Sexual activity: Yes    Birth control/protection: None  Other Topics Concern   Not on file  Social History Narrative   Lives with mom [name])    Right-handed   Caffeine: 3 cups of coffee per day   Social Determinants of Health   Financial Resource Strain: Not on file  Food Insecurity: Food Insecurity Present   Worried About Charity fundraiser in the Last Year: Often true   Arboriculturist in the Last Year:  Sometimes true  Transportation Needs: No Transportation Needs   Lack of Transportation (Medical): No   Lack of Transportation (Non-Medical): No  Physical Activity: Not on file  Stress: Not on file  Social Connections: Not on file  Intimate Partner Violence: Not on file    Ms. Mates's family history includes ADD / ADHD in her cousin; Alcohol abuse in her maternal aunt, maternal grandfather, paternal aunt, and paternal grandfather; Anxiety disorder in her mother; Arnold-Chiari malformation in her mother; Arthritis in her mother; Asthma in her mother; Bipolar disorder in her father; Bone cancer (age of onset: 65) in her maternal grandfather; COPD in her father and mother; Cancer in her paternal grandmother; Colonic polyp in her mother; Congestive Heart Failure in her father; Dementia in her maternal grandmother and paternal grandfather; Depression in her father, maternal aunt, and paternal aunt; Diabetes in her maternal grandmother and paternal grandfather; Drug abuse in her maternal grandfather and paternal grandfather; Eczema in her mother; Hypertension in her father, maternal grandmother, and mother; Multiple myeloma in her maternal  grandfather; OCD in her maternal grandmother and mother; Osteoporosis in her mother; Pancreatic cancer in her maternal aunt; Peripheral Artery Disease in her mother; Restless legs syndrome in her mother; Scoliosis in her father; Squamous cell carcinoma in her mother.      Objective:    Vitals:   03/12/21 0856  BP: 130/70  Pulse: 90  Resp: 18  SpO2: 98%    Physical Exam Well-developed well-nourished young white female in no acute distress.  Height, Weight, 319 BMI 50.81  HEENT; nontraumatic normocephalic, EOMI, PE R LA, sclera anicteric. Oropharynx; not examined today Neck; supple, no JVD Cardiovascular; regular rate and rhythm with S1-S2, no murmur rub or gallop Pulmonary; Clear bilaterally Abdomen; soft, obese there is some mild bilateral lower quadrant tenderness, no guarding or rebound, nondistended, no palpable mass or hepatosplenomegaly, bowel sounds are active Rectal; not done today Skin; benign exam, no jaundice rash or appreciable lesions Extremities; no clubbing cyanosis or edema skin warm and dry Neuro/Psych; alert and oriented x4, grossly nonfocal mood and affect appropriate        Assessment & Plan:   #39 36 year old white female with chronic diarrhea, postprandial urgency, abdominal cramping and gassiness. She is status post remote cholecystectomy, she has not had significant improvement with use of Questran though usually just using once a day. Not convinced that all of her symptoms are consistent with postcholecystectomy diarrhea. Rule out underlying IBD, microscopic colitis, versus IBS-D  #2 POTS 3.  Morbid obesity BMI 50.8 4.  History of sleep apnea with CPAP use 5.  PCOS 6.  PTSD 7.  Depression  Plan; sed rate, CRP, TTG and IgA Start trial of dicyclomine 10 mg p.o. 3 times daily 30 minutes to 1 hour AC Continue cholestyramine 4 g once daily Minimize lactose Patient will be scheduled for colonoscopy with Dr. Silverio [name] to include random biopsies.   Procedure was discussed in detail with the patient including indications risks and benefits and she is agreeable to proceed.  Due to BMI greater than 50 procedure will need to be scheduled at the hospital. No availability for hospital procedures through the end of March.  Patient will be placed on cancellation list, and hope to get her scheduled for procedure in April, as soon as that schedule is out.  Landa Mullinax S Conchita Truxillo PA-C 03/12/2021   Cc: Ladell Pier, MD

## 2021-03-12 NOTE — Telephone Encounter (Signed)
Copied from CRM [id number]. Topic: General - Other >> Mar 12, 2021  1:10 PM [place], [place] wrote: Reason for PPI:[id number] mother Sheryl Saintil called in . Patient needs to renew orange card

## 2021-03-12 NOTE — Patient Instructions (Addendum)
If you are age 34 or younger, your body mass index should be between 19-25. Your Body mass index is 50.81 kg/m. If this is out of the aformentioned range listed, please consider follow up with your Primary Care Provider.  ________________________________________________________  The [place] [facility] providers would like to encourage you to use [facility] to communicate with providers for non-urgent requests or questions.  Due to long hold times on the telephone, sending your provider a message by [facility] may be a faster and more efficient way to get a response.  Please allow 48 business hours for a response.  Please remember that this is for non-urgent requests.  _______________________________________________________  We will contact you once we get a date for you to have your Colonoscopy.  Your provider has requested that you go to the basement level for lab work before leaving today. Press "B" on the elevator. The lab is located at the first door on the left as you exit the elevator.  START Dicyclomine 10 mg 1 capsule 1 hour before meals as needed.  Continue Questran daily.  Thank you for entrusting me with your care and choosing [facility].  [name] Esterwood, PA-C

## 2021-03-13 LAB — TISSUE TRANSGLUTAMINASE, IGA: (tTG) Ab, IgA: <1 U/mL

## 2021-03-13 LAB — IGA: Immunoglobulin A: 86 mg/dL (ref 47–310)

## 2021-03-13 NOTE — Telephone Encounter (Signed)
I return Pt call, LVM to call back 

## 2021-03-28 NOTE — Progress Notes (Signed)
Reviewed and agree with documentation and assessment and plan. K. Veena Annaleigha Woo , MD   

## 2021-04-16 ENCOUNTER — Ambulatory Visit: Payer: Self-pay | Attending: Internal Medicine

## 2021-04-16 ENCOUNTER — Other Ambulatory Visit: Payer: Self-pay

## 2021-04-19 ENCOUNTER — Other Ambulatory Visit: Payer: Self-pay

## 2021-04-19 ENCOUNTER — Telehealth: Payer: Self-pay

## 2021-04-19 MED ORDER — GOLYTELY 236 G PO SOLR: ORAL | 0 refills | Status: DC

## 2021-04-19 NOTE — Telephone Encounter (Signed)
Left the patient a message about scheduling her colonoscopy. Date available is 06/11/21. Asked she call or send a message through My Chart to accept or decline this date. ?

## 2021-04-19 NOTE — Telephone Encounter (Signed)
Spoke with the patient. ?She accepts the date of 06/11/21 and the time of 8:30 am. ?Instruction and prep prescription mailed to the patient. ?She will call and ask for Beth if she has any questions or concerns. ?

## 2021-04-25 ENCOUNTER — Ambulatory Visit (HOSPITAL_COMMUNITY): Payer: No Payment, Other | Admitting: Licensed Clinical Social Worker

## 2021-05-01 ENCOUNTER — Telehealth (INDEPENDENT_AMBULATORY_CARE_PROVIDER_SITE_OTHER): Payer: No Payment, Other | Admitting: Psychiatry

## 2021-05-01 ENCOUNTER — Other Ambulatory Visit: Payer: Self-pay

## 2021-05-01 DIAGNOSIS — F332 Major depressive disorder, recurrent severe without psychotic features: Secondary | ICD-10-CM

## 2021-05-01 DIAGNOSIS — F4010 Social phobia, unspecified: Secondary | ICD-10-CM

## 2021-05-01 DIAGNOSIS — F32A Depression, unspecified: Secondary | ICD-10-CM

## 2021-05-01 DIAGNOSIS — F333 Major depressive disorder, recurrent, severe with psychotic symptoms: Secondary | ICD-10-CM | POA: Diagnosis not present

## 2021-05-01 DIAGNOSIS — F419 Anxiety disorder, unspecified: Secondary | ICD-10-CM | POA: Diagnosis not present

## 2021-05-01 MED ORDER — HYDROXYZINE HCL 25 MG PO TABS
ORAL_TABLET | Freq: Three times a day (TID) | ORAL | 2 refills | Status: DC | PRN
  Filled 2021-05-01: qty 90, 90d supply, fill #0

## 2021-05-01 MED ORDER — TRAZODONE HCL 100 MG PO TABS
100.0000 mg | ORAL_TABLET | Freq: Every evening | ORAL | 2 refills | Status: DC | PRN
  Filled 2021-05-01: qty 30, 30d supply, fill #0

## 2021-05-01 MED ORDER — PROPRANOLOL HCL 20 MG PO TABS
20.0000 mg | ORAL_TABLET | Freq: Two times a day (BID) | ORAL | 2 refills | Status: DC
  Filled 2021-05-01: qty 60, 30d supply, fill #0

## 2021-05-01 MED ORDER — SERTRALINE HCL 100 MG PO TABS
150.0000 mg | ORAL_TABLET | Freq: Every day | ORAL | 2 refills | Status: DC
  Filled 2021-05-01: qty 45, 30d supply, fill #0

## 2021-05-01 NOTE — Progress Notes (Signed)
BH MD/PA/NP OP Progress Note ? ?05/01/2021 3:55 PM ?Kristen [name]  ?MRN:  [id number] ? ?Virtual Visit via Video Note ? ?I connected with Kristen [name] on 05/01/21 at  4:00 PM EDT by a video enabled telemedicine application and verified that I am speaking with the correct person using two identifiers. ? ?Location: ?Patient: home ?Provider: offsite ?  ?I discussed the limitations of evaluation and management by telemedicine and the availability of in person appointments. The patient expressed understanding and agreed to proceed. ? ?  ?I discussed the assessment and treatment plan with the patient. The patient was provided an opportunity to ask questions and all were answered. The patient agreed with the plan and demonstrated an understanding of the instructions. ?  ?The patient was advised to call back or seek an in-person evaluation if the symptoms worsen or if the condition fails to improve as anticipated. ? ?I provided 5 minutes of non-face-to-face time during this encounter. ? ? ?Franne Grip, NP  ? ?Chief Complaint: Medication management ? ?HPI: Kristen [name] is a 36 year old female presenting to [facility] behavioral health outpatient for a follow-up psychiatric evaluation.  She has a psychiatric history of major depressive disorder, PTSD, and anxiety.  Patient symptoms are managed with trazodone 100 mg at bedtime as needed for sleep, Zoloft 150 mg at bedtime, propranolol 20 mg twice daily, Hydroxyzine 25 mg 3 times daily as needed for anxiety, and Rexulti 1 mg daily.  Patient reports that she has not been able to pick up [place] and is awaiting patient assistance program.  Patient confirms compliance with all other medications.  She denies adverse medication effects and the need for dosage adjustment today.  Patient will continue with individual psychotherapy.  No medication changes today. ? ?Patient is alert and oriented x4, calm, pleasant and willing to engage.  She reports an okay mood.   Patient reports life stressors and stated that she has a colonoscopy appointment upcoming and her fianc? was terminated from his job recently which is placed on a financial strain.  Patient reports good appetite and sleep.  Patient denies suicidal or homicidal ideations, paranoia, delusional thought, auditory or visual hallucinations. ? ? ?Visit Diagnosis:  ?  ICD-10-CM   ?1. MDD (major depressive disorder), recurrent severe, without psychosis ([place])  F33.2   ?  ? ? ?Past Psychiatric History: Major depressive disorder, anxiety, PTSD ? ?Past Medical History:  ?Past Medical History:  ?Diagnosis Date  ? Anxiety 2013  ? treated at [facility] by Pauline Good   ? Asthma 1989   ? no hospitalization, or intubation, previously on advair and singulair. asthma worsening.   ? 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 Depression 2001  ? depressed since childhood   ? Dysrhythmia 2010  ? PVC's  ? Essential hypertension 03/12/2019  ? GERD (gastroesophageal reflux disease)   ? History of hiatal hernia   ? History of suicidal ideation   ? IBS (irritable bowel syndrome)   ? Kyphosis of thoracic region 2004   ? painful, runs in dad side   ? Migraine   ? Pancreatitis 2003  ? gallstone pancreatitis   ? Pancreatitis due to biliary obstruction 2003  ? PCOS (polycystic ovarian syndrome) 2014  ? facial hair, irregular periods, never been pregnant   ? Sleep apnea   ? no longer uses cpap  ?  ?Past Surgical History:  ?Procedure Laterality Date  ? BLADDER SURGERY    ?  CHOLECYSTECTOMY  2003   ? DILATATION & CURETTAGE/HYSTEROSCOPY WITH MYOSURE N/A 12/14/2019  ? Procedure: DILATATION & CURETTAGE/HYSTEROSCOPY WITH Polypectomy with MYOSURE;  Surgeon: Osborne Oman, MD;  Location: [place];  Service: Gynecology;  Laterality: N/A;  ? OPEN REDUCTION INTERNAL FIXATION (ORIF) PROXIMAL PHALANX Right 07/23/2013  ? Procedure: OPEN TREATMENT RIGHT RING FINGER, PROXIMAL INTERPHALANGEAL/JOINT FRACTURE DISLOCATION;  Surgeon: Jolyn Nap, MD;   Location: [place];  Service: Orthopedics;  Laterality: Right;  ? TONSILLECTOMY    ? and adenoidectomy  ? ? ?Family Psychiatric History: See below ? ?Family History:  ?Family History  ?Problem Relation Age of Onset  ? Hypertension Mother   ? Arnold-Chiari malformation Mother   ? Restless legs syndrome Mother   ? Osteoporosis Mother   ? COPD Mother   ? Asthma Mother   ? Squamous cell carcinoma Mother   ?     skin   ? Eczema Mother   ? Arthritis Mother   ? Peripheral Artery Disease Mother   ? Anxiety disorder Mother   ? OCD Mother   ? Colonic polyp Mother   ? Hypertension Father   ? Scoliosis Father   ? Bipolar disorder Father   ? Congestive Heart Failure Father   ? COPD Father   ? Depression Father   ? Diabetes Maternal Grandmother   ? Hypertension Maternal Grandmother   ? Dementia Maternal Grandmother   ? OCD Maternal Grandmother   ? Bone cancer Maternal Grandfather 82  ?     bone marrow   ? Multiple myeloma Maternal Grandfather   ? Alcohol abuse Maternal Grandfather   ? Drug abuse Maternal Grandfather   ? Cancer Paternal Grandmother   ?     colon  ? Diabetes Paternal Grandfather   ? Alcohol abuse Paternal Grandfather   ? Drug abuse Paternal Grandfather   ? Dementia Paternal Grandfather   ? Alcohol abuse Maternal Aunt   ? Depression Maternal Aunt   ? Pancreatic cancer Maternal Aunt   ? Alcohol abuse Paternal Aunt   ? Depression Paternal Aunt   ? ADD / ADHD Cousin   ? ? ?Social History:  ?Social History  ? ?Socioeconomic History  ? Marital status: Single  ?  Spouse name: Not on file  ? Number of children: 0  ? Years of education: college   ? Highest education level: Not on file  ?Occupational History  ? Occupation: Unemployed   ?Tobacco Use  ? Smoking status: Never  ? Smokeless tobacco: Never  ?Vaping Use  ? Vaping Use: Never used  ?Substance and Sexual Activity  ? Alcohol use: No  ? Drug use: No  ? Sexual activity: Yes  ?  Birth control/protection: None  ?Other Topics Concern  ? Not on file  ?Social  History Narrative  ? Lives with mom [name])   ? Right-handed  ? Caffeine: 3 cups of coffee per day  ? ?Social Determinants of Health  ? ?Financial Resource Strain: Not on file  ?Food Insecurity: Food Insecurity Present  ? Worried About Charity fundraiser in the Last Year: Often true  ? Ran Out of Food in the Last Year: Sometimes true  ?Transportation Needs: No Transportation Needs  ? Lack of Transportation (Medical): No  ? Lack of Transportation (Non-Medical): No  ?Physical Activity: Not on file  ?Stress: Not on file  ?Social Connections: Not on file  ? ? ?Allergies:  ?Allergies  ?Allergen Reactions  ? Mucinex [Guaifenesin Er] Hives, Itching and  Swelling  ? Penicillins Swelling  ?  Did it involve swelling of the face/tongue/throat, SOB, or low BP? Yes ?Did it involve sudden or severe rash/hives, skin peeling, or any reaction on the inside of your mouth or nose? No ?Did you need to seek medical attention at a hospital or doctor's office? Yes ?When did it last happen?      >10 years ago ?If all above answers are ?NO?, may proceed with cephalosporin use. ?  ? Contrast Media [Iodinated Contrast Media] Nausea And Vomiting  ? Latex Hives and Swelling  ? Multihance [Gadobenate] Nausea And Vomiting  ? Other   ?  Pickles - throat swelling and nausea   ? ? ?Metabolic Disorder Labs: ?Lab Results  ?Component Value Date  ? HGBA1C 4.9 04/15/2019  ? MPG 93.93 04/15/2019  ? ?Lab Results  ?Component Value Date  ? PROLACTIN 15.3 11/01/2020  ? PROLACTIN 20.5 07/14/2020  ? ?Lab Results  ?Component Value Date  ? CHOL 204 (H) 04/15/2019  ? TRIG 146 04/15/2019  ? HDL 41 04/15/2019  ? CHOLHDL 5.0 04/15/2019  ? VLDL 29 04/15/2019  ? LDLCALC 134 (H) 04/15/2019  ? ?Lab Results  ?Component Value Date  ? TSH 2.99 11/01/2020  ? TSH 4.628 (H) 04/15/2019  ? ? ?Therapeutic Level Labs: ?No results found for: LITHIUM ?No results found for: VALPROATE ?No components found for:  CBMZ ? ?Current Medications: ?Current Outpatient Medications  ?Medication  Sig Dispense Refill  ? albuterol (VENTOLIN HFA) 108 (90 Base) MCG/ACT inhaler Inhale 2 puffs into the lungs every 6 (six) hours as needed for wheezing or shortness of breath. 6.7 g 3  ? brexpiprazol

## 2021-05-02 ENCOUNTER — Other Ambulatory Visit: Payer: Self-pay

## 2021-05-09 ENCOUNTER — Other Ambulatory Visit: Payer: Self-pay

## 2021-05-16 ENCOUNTER — Inpatient Hospital Stay: Payer: Self-pay | Admitting: Oncology

## 2021-05-16 ENCOUNTER — Telehealth: Payer: Self-pay | Admitting: Oncology

## 2021-05-16 ENCOUNTER — Ambulatory Visit (HOSPITAL_COMMUNITY): Payer: No Payment, Other | Admitting: Licensed Clinical Social Worker

## 2021-05-16 ENCOUNTER — Encounter: Payer: Self-pay | Admitting: Oncology

## 2021-05-16 NOTE — Telephone Encounter (Signed)
Per 4/12 in basket. Called pt and left a message with appointment details. Left call back number if changes are needed.  ?

## 2021-06-04 ENCOUNTER — Other Ambulatory Visit (HOSPITAL_COMMUNITY): Payer: Self-pay

## 2021-06-05 ENCOUNTER — Encounter (HOSPITAL_COMMUNITY): Payer: Self-pay | Admitting: Gastroenterology

## 2021-06-06 ENCOUNTER — Other Ambulatory Visit: Payer: Self-pay

## 2021-06-06 MED ORDER — GOLYTELY 236 G PO SOLR: ORAL | 0 refills | Status: DC

## 2021-06-06 MED ORDER — PEG 3350-KCL-NABCB-NACL-NASULF 236 G PO SOLR
ORAL | 0 refills | Status: DC
  Filled 2021-06-06: qty 4000, 1d supply, fill #0

## 2021-06-06 NOTE — Telephone Encounter (Signed)
Patient called states pharmacy did not receive prep medication. Patient requesting prep be sent to [facility] ([address], [place], [place] [address]). Please advise. ?

## 2021-06-06 NOTE — Telephone Encounter (Signed)
The prescription was mailed to her with the instructions. I will transmit the prescription to the pharmacy as she requests. ?

## 2021-06-07 ENCOUNTER — Other Ambulatory Visit: Payer: Self-pay

## 2021-06-11 ENCOUNTER — Other Ambulatory Visit: Payer: Self-pay

## 2021-06-11 ENCOUNTER — Ambulatory Visit (HOSPITAL_BASED_OUTPATIENT_CLINIC_OR_DEPARTMENT_OTHER): Payer: Self-pay | Admitting: Registered Nurse

## 2021-06-11 ENCOUNTER — Ambulatory Visit (HOSPITAL_COMMUNITY)
Admission: RE | Admit: 2021-06-11 | Discharge: 2021-06-11 | Disposition: A | Discharge status: Home / Self Care | Payer: Self-pay | Attending: Gastroenterology | Admitting: Gastroenterology

## 2021-06-11 ENCOUNTER — Ambulatory Visit (HOSPITAL_COMMUNITY): Payer: Self-pay | Admitting: Registered Nurse

## 2021-06-11 ENCOUNTER — Encounter (HOSPITAL_COMMUNITY)
Admission: RE | Disposition: A | Discharge status: Home / Self Care | Payer: Self-pay | Source: Home / Self Care | Attending: Gastroenterology

## 2021-06-11 ENCOUNTER — Encounter (HOSPITAL_COMMUNITY): Payer: Self-pay | Admitting: Gastroenterology

## 2021-06-11 DIAGNOSIS — J45909 Unspecified asthma, uncomplicated: Secondary | ICD-10-CM | POA: Insufficient documentation

## 2021-06-11 DIAGNOSIS — R197 Diarrhea, unspecified: Secondary | ICD-10-CM

## 2021-06-11 DIAGNOSIS — G4730 Sleep apnea, unspecified: Secondary | ICD-10-CM

## 2021-06-11 DIAGNOSIS — I10 Essential (primary) hypertension: Secondary | ICD-10-CM | POA: Insufficient documentation

## 2021-06-11 DIAGNOSIS — N289 Disorder of kidney and ureter, unspecified: Secondary | ICD-10-CM

## 2021-06-11 DIAGNOSIS — K529 Noninfective gastroenteritis and colitis, unspecified: Secondary | ICD-10-CM | POA: Insufficient documentation

## 2021-06-11 DIAGNOSIS — K449 Diaphragmatic hernia without obstruction or gangrene: Secondary | ICD-10-CM | POA: Insufficient documentation

## 2021-06-11 DIAGNOSIS — K219 Gastro-esophageal reflux disease without esophagitis: Secondary | ICD-10-CM | POA: Insufficient documentation

## 2021-06-11 HISTORY — PX: BIOPSY: SHX5522

## 2021-06-11 HISTORY — PX: COLONOSCOPY WITH PROPOFOL: SHX5780

## 2021-06-11 LAB — POCT I-STAT, CHEM 8
BUN: 7 mg/dL (ref 6–20)
Calcium, Ion: 1.23 mmol/L (ref 1.15–1.40)
Chloride: 104 mmol/L (ref 98–111)
Creatinine, Ser: 0.9 mg/dL (ref 0.44–1.00)
Glucose, Bld: 95 mg/dL (ref 70–99)
HCT: 45 % (ref 36.0–46.0)
Hemoglobin: 15.3 g/dL — ABNORMAL HIGH (ref 12.0–15.0)
Potassium: 4.4 mmol/L (ref 3.5–5.1)
Sodium: 140 mmol/L (ref 135–145)
TCO2: 26 mmol/L (ref 22–32)

## 2021-06-11 LAB — PREGNANCY, URINE: Preg Test, Ur: NEGATIVE

## 2021-06-11 SURGERY — COLONOSCOPY WITH PROPOFOL
Anesthesia: Monitor Anesthesia Care

## 2021-06-11 MED ORDER — ONDANSETRON HCL 4 MG/2ML IJ SOLN
INTRAMUSCULAR | Status: DC | PRN
  Administered 2021-06-11: 4 mg via INTRAVENOUS

## 2021-06-11 MED ORDER — CHOLESTYRAMINE 4 G PO PACK
4.0000 g | PACK | Freq: Three times a day (TID) | ORAL | 3 refills | Status: DC
  Filled 2021-06-11: qty 60, 20d supply, fill #0

## 2021-06-11 MED ORDER — PROPOFOL 500 MG/50ML IV EMUL
INTRAVENOUS | Status: DC | PRN
  Administered 2021-06-11: 130 ug/kg/min via INTRAVENOUS

## 2021-06-11 MED ORDER — LACTATED RINGERS IV SOLN
INTRAVENOUS | Status: DC
Start: 2021-06-11 — End: 2021-06-11

## 2021-06-11 MED ORDER — PROPOFOL 10 MG/ML IV BOLUS
INTRAVENOUS | Status: DC | PRN
Start: 2021-06-11 — End: 2021-06-11
  Administered 2021-06-11 (×2): 20 mg via INTRAVENOUS

## 2021-06-11 MED ORDER — SODIUM CHLORIDE 0.9 % IV SOLN: INTRAVENOUS | Status: DC

## 2021-06-11 MED ORDER — PROPOFOL 1000 MG/100ML IV EMUL
INTRAVENOUS | Status: AC
  Filled 2021-06-11: qty 100

## 2021-06-11 SURGICAL SUPPLY — 22 items

## 2021-06-11 NOTE — Discharge Instructions (Signed)

## 2021-06-11 NOTE — Anesthesia Postprocedure Evaluation (Signed)
Anesthesia Post Note ? ?Patient: Kristen [name] ? ?Procedure(s) Performed: COLONOSCOPY WITH PROPOFOL ?BIOPSY ? ?  ? ?Patient location during evaluation: Endoscopy ?Anesthesia Type: MAC ?Level of consciousness: awake ?Pain management: pain level controlled ?Vital Signs Assessment: post-procedure vital signs reviewed and stable ?Respiratory status: spontaneous breathing ?Cardiovascular status: stable ?Postop Assessment: no apparent nausea or vomiting ?Anesthetic complications: no ? ? ?No notable events documented. ? ?Last Vitals:  ?Vitals:  ? 06/11/21 0942 06/11/21 0952  ?BP: 134/63 (!) 130/58  ?Pulse: 73 61  ?Resp: 19 17  ?Temp:    ?SpO2: 97% 100%  ?  ?Last Pain:  ?Vitals:  ? 06/11/21 0952  ?TempSrc:   ?PainSc: 0-No pain  ? ? ?  ?  ?  ?  ?  ?  ? ?Auden Tatar ? ? ? ? ?

## 2021-06-11 NOTE — Op Note (Signed)
[facility] ?Patient Name: Kristen [name] ?Procedure Date: 06/11/2021 ?MRN: [id number] ?Attending MD: Mauri Pole , MD ?Date of Birth: 27-Aug-1985 ?CSN: [id number] ?Age: 36 ?Admit Type: Outpatient ?Procedure:                Colonoscopy ?Indications:              Clinically significant diarrhea of unexplained  ?                          origin ?Providers:                Mauri Pole, MD, Lurline Del, RN, Charlean Merl  ?                          Purcell Nails, Technician, Ivar Drape CRNA, CRNA ?Referring MD:              ?Medicines:                Monitored Anesthesia Care ?Complications:            No immediate complications. ?Estimated Blood Loss:     Estimated blood loss was minimal. ?Procedure:                Pre-Anesthesia Assessment: ?                          - Prior to the procedure, a History and Physical  ?                          was performed, and patient medications and  ?                          allergies were reviewed. The patient's tolerance of  ?                          previous anesthesia was also reviewed. The risks  ?                          and benefits of the procedure and the sedation  ?                          options and risks were discussed with the patient.  ?                          All questions were answered, and informed consent  ?                          was obtained. Prior Anticoagulants: The patient has  ?                          taken no previous anticoagulant or antiplatelet  ?                          agents. ASA Grade Assessment: III - A patient with  ?  severe systemic disease. After reviewing the risks  ?                          and benefits, the patient was deemed in  ?                          satisfactory condition to undergo the procedure. ?                          After obtaining informed consent, the colonoscope  ?                          was passed under direct vision. Throughout the  ?                           procedure, the patient's blood pressure, pulse, and  ?                          oxygen saturations were monitored continuously. The  ?                          [id number]) Olympus colonoscope was  ?                          introduced through the anus and advanced to the the  ?                          cecum, identified by appendiceal orifice and  ?                          ileocecal valve. The colonoscopy was performed  ?                          without difficulty. The patient tolerated the  ?                          procedure well. The quality of the bowel  ?                          preparation was excellent. The terminal ileum,  ?                          ileocecal valve, appendiceal orifice, and rectum  ?                          were photographed. ?Scope In: 9:12:32 AM ?Scope Out: 9:25:30 AM ?Scope Withdrawal Time: 0 hours 10 minutes 45 seconds  ?Total Procedure Duration: 0 hours 12 minutes 58 seconds  ?Findings: ?     The perianal and digital rectal examinations were normal. ?     Normal mucosa was found in the entire colon. Biopsies for histology were  ?     taken with a cold forceps from the right colon and left colon for  ?     evaluation of microscopic colitis. ?     The terminal ileum  appeared normal. ?     The retroflexed view of the distal rectum and anal verge was normal and  ?     showed no anal or rectal abnormalities. ?Impression:               - Normal mucosa in the entire examined colon.  ?                          Biopsied. ?                          - The examined portion of the ileum was normal. ?                          - The distal rectum and anal verge are normal on  ?                          retroflexion view. ?Moderate Sedation: ?     Not Applicable - Patient had care per Anesthesia. ?Recommendation:           - Patient has a contact number available for  ?                          emergencies. The signs and symptoms of potential  ?                          delayed  complications were discussed with the  ?                          patient. Return to normal activities tomorrow.  ?                          Written discharge instructions were provided to the  ?                          patient. ?                          - Resume previous diet. ?                          - Continue present medications. ?                          - Await pathology results. ?                          - Repeat colonoscopy in 10 years for screening  ?                          purposes. ?                          - Return to GI clinic at appointment to be  ?                          scheduled at next available appointment in  1-2  ?                          months. ?Procedure Code(s):        --- Professional --- ?                          [phone], Colonoscopy, flexible; with biopsy, single  ?                          or multiple ?Diagnosis Code(s):        --- Professional --- ?                          R19.7, Diarrhea, unspecified ?CPT copyright 2019 American Medical Association. All rights reserved. ?The codes documented in this report are preliminary and upon coder review may  ?be revised to meet current compliance requirements. ?Mauri Pole, MD ?06/11/2021 9:34:09 AM ?This report has been signed electronically. ?Number of Addenda: 0 ?

## 2021-06-11 NOTE — Anesthesia Procedure Notes (Signed)
Procedure Name: Gardner ?Date/Time: 06/11/2021 9:08 AM ?Performed by: Victoriano Lain, CRNA ?Pre-anesthesia Checklist: Patient identified, Emergency Drugs available, Suction available, Patient being monitored and Timeout performed ?Patient Re-evaluated:Patient Re-evaluated prior to induction ?Oxygen Delivery Method: Simple face mask ?Dental Injury: Teeth and Oropharynx as per pre-operative assessment  ? ? ? ? ?

## 2021-06-11 NOTE — Anesthesia Preprocedure Evaluation (Addendum)
Anesthesia Evaluation  ?Patient identified by MRN, date of birth, ID band ?Patient awake ? ? ? ?Reviewed: ?Allergy & Precautions, NPO status , Patient's Chart, lab work & pertinent test results ? ?Airway ?Mallampati: II ? ?TM Distance: >3 FB ? ? ? ? Dental ?  ?Pulmonary ?asthma , sleep apnea ,  ?  ?breath sounds clear to auscultation ? ? ? ? ? ? Cardiovascular ?hypertension, + dysrhythmias  ?Rhythm:Regular Rate:Normal ? ? ?  ?Neuro/Psych ? Headaches,   ? GI/Hepatic ?Neg liver ROS, hiatal hernia, GERD  ,  ?Endo/Other  ? ? Renal/GU ?Renal disease  ? ?  ?Musculoskeletal ? ? Abdominal ?  ?Peds ? Hematology ?  ?Anesthesia Other Findings ? ? Reproductive/Obstetrics ? ?  ? ? ? ? ? ? ? ? ? ? ? ? ? ?  ?  ? ? ? ? ? ? ? ?Anesthesia Physical ?Anesthesia Plan ? ?ASA: 3 ? ?Anesthesia Plan: MAC  ? ?Post-op Pain Management:   ? ?Induction:  ? ?PONV Risk Score and Plan: Ondansetron, Dexamethasone and Midazolam ? ?Airway Management Planned: Simple Face Mask ? ?Additional Equipment:  ? ?Intra-op Plan:  ? ?Post-operative Plan:  ? ?Informed Consent: I have reviewed the patients History and Physical, chart, labs and discussed the procedure including the risks, benefits and alternatives for the proposed anesthesia with the patient or authorized representative who has indicated his/her understanding and acceptance.  ? ? ? ?Dental advisory given ? ?Plan Discussed with: CRNA and Anesthesiologist ? ?Anesthesia Plan Comments:   ? ? ? ? ? ? ?Anesthesia Quick Evaluation ? ?

## 2021-06-11 NOTE — H&P (Signed)
[facility] History and Physical ? ? ?Primary Care Physician:  Ladell Pier, MD ? ? ?Reason for Procedure:   Chronic diarrhea ? ?Plan:    Colonoscopy with biopsies ? ? ? ? ?HPI: Kristen [name] is a 36 y.o. female here for colonoscopy with biopsies to evaluate chronic diarrhea, exclude microscopic colitis or IBD ?The risks and benefits as well as alternatives of endoscopic procedure(s) have been discussed and reviewed. All questions answered. The patient agrees to proceed. ? ? ? ?Past Medical History:  ?Diagnosis Date  ? Anxiety 2013  ? treated at [facility] by Pauline Good   ? Asthma 1989   ? no hospitalization, or intubation, previously on advair and singulair. asthma worsening.   ? 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 Depression 2001  ? depressed since childhood   ? Dysrhythmia 2010  ? PVC's  ? Essential hypertension 03/12/2019  ? GERD (gastroesophageal reflux disease)   ? History of hiatal hernia   ? History of suicidal ideation   ? IBS (irritable bowel syndrome)   ? Kyphosis of thoracic region 2004   ? painful, runs in dad side   ? Migraine   ? Pancreatitis 2003  ? gallstone pancreatitis   ? Pancreatitis due to biliary obstruction 2003  ? PCOS (polycystic ovarian syndrome) 2014  ? facial hair, irregular periods, never been pregnant   ? Sleep apnea   ? no longer uses cpap  ? ? ?Past Surgical History:  ?Procedure Laterality Date  ? BLADDER SURGERY    ? CHOLECYSTECTOMY  2003   ? DILATATION & CURETTAGE/HYSTEROSCOPY WITH MYOSURE N/A 12/14/2019  ? Procedure: DILATATION & CURETTAGE/HYSTEROSCOPY WITH Polypectomy with MYOSURE;  Surgeon: Osborne Oman, MD;  Location: [facility];  Service: Gynecology;  Laterality: N/A;  ? OPEN REDUCTION INTERNAL FIXATION (ORIF) PROXIMAL PHALANX Right 07/23/2013  ? Procedure: OPEN TREATMENT RIGHT RING FINGER, PROXIMAL INTERPHALANGEAL/JOINT FRACTURE DISLOCATION;  Surgeon: Jolyn Nap, MD;  Location: [facility];  Service:  Orthopedics;  Laterality: Right;  ? TONSILLECTOMY    ? and adenoidectomy  ? ? ?Prior to Admission medications   ?Medication Sig Start Date End Date Taking? Authorizing Provider  ?albuterol (VENTOLIN HFA) 108 (90 Base) MCG/ACT inhaler Inhale 2 puffs into the lungs every 6 (six) hours as needed for wheezing or shortness of breath. 03/12/19  Yes Ladell Pier, MD  ?Benzocaine-Resorcinol (VAGISIL EX) Apply 1 application. topically 2 (two) times daily.   Yes [provider]  ?cholestyramine (QUESTRAN) 4 g packet Take 1 packet (4 g total) by mouth 2 (two) times daily. Take 2 hours before or after all other medications. 11/21/20  Yes Levin Erp, PA  ?dicyclomine (BENTYL) 10 MG capsule Take 1 capsule by mouth 1 hour before meals as needed for diarrhea 03/12/21  Yes Esterwood, [name] S, PA-C  ?gabapentin (NEURONTIN) 300 MG capsule TAKE 1 CAPSULE (300 MG TOTAL) BY MOUTH AT BEDTIME. ?Patient taking differently: Take 300 mg by mouth at bedtime as needed (pain). 03/24/20 06/06/21 Yes Ladell Pier, MD  ?hydrOXYzine (ATARAX) 25 MG tablet TAKE 1 TABLET (25 MG TOTAL) BY MOUTH 3 (THREE) TIMES DAILY AS NEEDED FOR ANXIETY. 05/01/21  Yes Penn, Cicely, NP  ?ipratropium-albuterol (DUONEB) 0.5-2.5 (3) MG/3ML SOLN Take 3 mLs by nebulization every 4 (four) hours as needed.   Yes [provider]  ?loperamide (IMODIUM A-D) 2 MG tablet Take 2 mg by mouth 4 (four) times daily as needed for  diarrhea or loose stools.   Yes [provider]  ?LORazepam (ATIVAN) 1 MG tablet Take 1 tablet (1 mg total) by mouth every 8 (eight) hours as needed for anxiety. 08/17/20  Yes Anyanwu, Sallyanne Havers, MD  ?megestrol (MEGACE) 40 MG tablet Take 2 tablets (80 mg total) by mouth daily. Can increase to two tablets twice a day in the event of heavy bleeding 08/17/20  Yes Anyanwu, Sallyanne Havers, MD  ?omeprazole (PRILOSEC) 20 MG capsule Take 1 capsule (20 mg total) by mouth daily. 07/11/20  Yes Ladell Pier, MD  ?Phenazopyridine HCl (AZO  URINARY PAIN RELIEF) 99.5 MG TABS Take 2 tablets by mouth 3 (three) times daily.   Yes [provider]  ?propranolol (INDERAL) 20 MG tablet Take 1 tablet (20 mg total) by mouth 2 (two) times daily. For anxiety 05/01/21  Yes Penn, Lunette Stands, NP  ?sertraline (ZOLOFT) 100 MG tablet Take 1 and 1/2 tablets (150 mg total) by mouth at bedtime. 05/01/21  Yes Penn, Lunette Stands, NP  ?traZODone (DESYREL) 100 MG tablet Take 1 tablet (100 mg total) by mouth at bedtime as needed for sleep. 05/01/21  Yes Penn, Lunette Stands, NP  ?brexpiprazole (REXULTI) 1 MG TABS tablet Take 1 tablet (1 mg total) by mouth daily. 02/16/21   Salley Slaughter, NP  ?polyethylene glycol (GOLYTELY) 236 g solution Mix and drink following MD instructions-do not follow box instructions 06/06/21   Mauri Pole, MD  ? ? ?Current Facility-Administered Medications  ?Medication Dose Route Frequency Provider Last Rate Last Admin  ? 0.9 %  sodium chloride infusion   Intravenous Continuous Abrian [name], MD      ? lactated ringers infusion   Intravenous Continuous Mauri Pole, MD 10 mL/hr at 06/11/21 0752 New Bag at 06/11/21 0752  ? ? ?Allergies as of 04/19/2021 - Review Complete 03/12/2021  ?Allergen Reaction Noted  ? Mucinex [guaifenesin er] Hives, Itching, and Swelling 08/27/2011  ? Penicillins Swelling 08/23/2016  ? Contrast media [iodinated contrast media] Nausea And Vomiting 08/23/2016  ? Latex Hives and Swelling 12/13/2019  ? Multihance [gadobenate] Nausea And Vomiting 07/14/2016  ? Other  12/13/2019  ? ? ?Family History  ?Problem Relation Age of Onset  ? Hypertension Mother   ? Arnold-Chiari malformation Mother   ? Restless legs syndrome Mother   ? Osteoporosis Mother   ? COPD Mother   ? Asthma Mother   ? Squamous cell carcinoma Mother   ?     skin   ? Eczema Mother   ? Arthritis Mother   ? Peripheral Artery Disease Mother   ? Anxiety disorder Mother   ? OCD Mother   ? Colonic polyp Mother   ? Hypertension Father   ? Scoliosis Father   ?  Bipolar disorder Father   ? Congestive Heart Failure Father   ? COPD Father   ? Depression Father   ? Diabetes Maternal Grandmother   ? Hypertension Maternal Grandmother   ? Dementia Maternal Grandmother   ? OCD Maternal Grandmother   ? Bone cancer Maternal Grandfather 82  ?     bone marrow   ? Multiple myeloma Maternal Grandfather   ? Alcohol abuse Maternal Grandfather   ? Drug abuse Maternal Grandfather   ? Cancer Paternal Grandmother   ?     colon  ? Diabetes Paternal Grandfather   ? Alcohol abuse Paternal Grandfather   ? Drug abuse Paternal Grandfather   ? Dementia Paternal Grandfather   ? Alcohol abuse Maternal Aunt   ?  Depression Maternal Aunt   ? Pancreatic cancer Maternal Aunt   ? Alcohol abuse Paternal Aunt   ? Depression Paternal Aunt   ? ADD / ADHD Cousin   ? ? ?Social History  ? ?Socioeconomic History  ? Marital status: Single  ?  Spouse name: Not on file  ? Number of children: 0  ? Years of education: college   ? Highest education level: Not on file  ?Occupational History  ? Occupation: Unemployed   ?Tobacco Use  ? Smoking status: Never  ? Smokeless tobacco: Never  ?Vaping Use  ? Vaping Use: Never used  ?Substance and Sexual Activity  ? Alcohol use: No  ? Drug use: No  ? Sexual activity: Yes  ?  Birth control/protection: None  ?Other Topics Concern  ? Not on file  ?Social History Narrative  ? Lives with mom [name])   ? Right-handed  ? Caffeine: 3 cups of coffee per day  ? ?Social Determinants of Health  ? ?Financial Resource Strain: Not on file  ?Food Insecurity: Food Insecurity Present  ? Worried About Charity fundraiser in the Last Year: Often true  ? Ran Out of Food in the Last Year: Sometimes true  ?Transportation Needs: No Transportation Needs  ? Lack of Transportation (Medical): No  ? Lack of Transportation (Non-Medical): No  ?Physical Activity: Not on file  ?Stress: Not on file  ?Social Connections: Not on file  ?Intimate Partner Violence: Not on file  ? ? ?Review of Systems: ? ?All other  review of systems negative except as mentioned in the HPI. ? ?Physical Exam: ?Vital signs in last 24 hours: ?Temp:  [97.9 ?F (36.6 ?C)] 97.9 ?F (36.6 ?C) (05/08 9223) ?Pulse Rate:  [78] 78 (05/08 0723) ?Resp:  [15] 15 (

## 2021-06-11 NOTE — Transfer of Care (Signed)
Immediate Anesthesia Transfer of Care Note ? ?Patient: Kristen [name] ? ?Procedure(s) Performed: COLONOSCOPY WITH PROPOFOL ?BIOPSY ? ?Patient Location: PACU and Endoscopy Unit ? ?Anesthesia Type:MAC ? ?Level of Consciousness: awake, alert , oriented and patient cooperative ? ?Airway & Oxygen Therapy: Patient Spontanous Breathing and Patient connected to face mask oxygen ? ?Post-op Assessment: Report given to RN, Post -op Vital signs reviewed and stable and Patient moving all extremities ? ?Post vital signs: Reviewed and stable ? ?Last Vitals:  ?Vitals Value Taken Time  ?BP    ?Temp    ?Pulse    ?Resp    ?SpO2    ? ? ?Last Pain:  ?Vitals:  ? 06/11/21 0723  ?TempSrc: Temporal  ?PainSc: 0-No pain  ?   ? ?Patients Stated Pain Goal: 5 (06/11/21 0723) ? ?Complications: No notable events documented. ?

## 2021-06-12 ENCOUNTER — Encounter (HOSPITAL_COMMUNITY): Payer: Self-pay | Admitting: Gastroenterology

## 2021-06-12 ENCOUNTER — Other Ambulatory Visit: Payer: Self-pay

## 2021-06-12 LAB — SURGICAL PATHOLOGY

## 2021-06-14 ENCOUNTER — Emergency Department (HOSPITAL_COMMUNITY)
Admission: EM | Admit: 2021-06-14 | Discharge: 2021-06-15 | Disposition: A | Discharge status: Home / Self Care | Payer: No Typology Code available for payment source | Attending: Emergency Medicine | Admitting: Emergency Medicine

## 2021-06-14 ENCOUNTER — Encounter (HOSPITAL_COMMUNITY): Payer: Self-pay | Admitting: Emergency Medicine

## 2021-06-14 ENCOUNTER — Other Ambulatory Visit: Payer: Self-pay

## 2021-06-14 ENCOUNTER — Emergency Department (HOSPITAL_COMMUNITY): Payer: No Typology Code available for payment source

## 2021-06-14 DIAGNOSIS — Y92410 Unspecified street and highway as the place of occurrence of the external cause: Secondary | ICD-10-CM | POA: Diagnosis not present

## 2021-06-14 DIAGNOSIS — R519 Headache, unspecified: Secondary | ICD-10-CM | POA: Diagnosis present

## 2021-06-14 DIAGNOSIS — M542 Cervicalgia: Secondary | ICD-10-CM | POA: Insufficient documentation

## 2021-06-14 DIAGNOSIS — Z91040 Latex allergy status: Secondary | ICD-10-CM | POA: Diagnosis not present
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
## 2021-06-14 NOTE — ED Notes (Addendum)
C-collar applied in triage.

## 2021-06-15 ENCOUNTER — Encounter: Payer: Self-pay | Admitting: Internal Medicine

## 2021-06-15 ENCOUNTER — Emergency Department (HOSPITAL_COMMUNITY): Payer: No Typology Code available for payment source

## 2021-06-15 ENCOUNTER — Other Ambulatory Visit: Payer: Self-pay

## 2021-06-15 ENCOUNTER — Encounter (HOSPITAL_COMMUNITY): Payer: Self-pay | Admitting: Emergency Medicine

## 2021-06-15 ENCOUNTER — Encounter: Payer: Self-pay | Admitting: Gastroenterology

## 2021-06-15 MED ORDER — LIDOCAINE 5 % EX PTCH
3.0000 | MEDICATED_PATCH | CUTANEOUS | Status: DC
  Administered 2021-06-15: 3 via TRANSDERMAL
  Filled 2021-06-15: qty 3

## 2021-06-15 MED ORDER — LIDOCAINE 5 % EX PTCH
1.0000 | MEDICATED_PATCH | CUTANEOUS | 0 refills | Status: DC
  Filled 2021-06-15: qty 30, 30d supply, fill #0

## 2021-06-15 MED ORDER — ACETAMINOPHEN 500 MG PO TABS
1000.0000 mg | ORAL_TABLET | Freq: Once | ORAL | Status: AC
  Administered 2021-06-15: 1000 mg via ORAL
  Filled 2021-06-15: qty 2

## 2021-06-15 MED ORDER — NAPROXEN 250 MG PO TABS
500.0000 mg | ORAL_TABLET | Freq: Once | ORAL | Status: AC
  Administered 2021-06-15: 500 mg via ORAL
  Filled 2021-06-15: qty 2

## 2021-06-15 MED ORDER — NAPROXEN 375 MG PO TABS
375.0000 mg | ORAL_TABLET | Freq: Two times a day (BID) | ORAL | 0 refills | Status: DC
  Filled 2021-06-15: qty 20, 10d supply, fill #0
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
## 2021-06-21 ENCOUNTER — Encounter: Payer: Self-pay | Admitting: Internal Medicine

## 2021-06-21 ENCOUNTER — Other Ambulatory Visit: Payer: Self-pay

## 2021-06-21 ENCOUNTER — Ambulatory Visit: Payer: Self-pay | Attending: Physician Assistant | Admitting: Internal Medicine

## 2021-06-21 VITALS — BP 129/94 | HR 70 | Wt 309.8 lb

## 2021-06-21 DIAGNOSIS — S29012A Strain of muscle and tendon of back wall of thorax, initial encounter: Secondary | ICD-10-CM

## 2021-06-21 DIAGNOSIS — S161XXA Strain of muscle, fascia and tendon at neck level, initial encounter: Secondary | ICD-10-CM

## 2021-06-21 DIAGNOSIS — R030 Elevated blood-pressure reading, without diagnosis of hypertension: Secondary | ICD-10-CM

## 2021-06-21 MED ORDER — DICLOFENAC SODIUM 1 % EX GEL
2.0000 g | Freq: Four times a day (QID) | CUTANEOUS | 0 refills | Status: DC
  Filled 2021-06-21: qty 100, 17d supply, fill #0
  Filled 2021-06-28: qty 100, 12d supply, fill #0

## 2021-06-21 MED ORDER — CYCLOBENZAPRINE HCL 5 MG PO TABS
5.0000 mg | ORAL_TABLET | Freq: Two times a day (BID) | ORAL | 0 refills | Status: DC | PRN
  Filled 2021-06-21 – 2021-06-28 (×3): qty 30, 15d supply, fill #0
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
## 2021-06-21 NOTE — Patient Instructions (Signed)
Use a heating pad to the neck twice a day for 15 minutes.

## 2021-06-25 ENCOUNTER — Other Ambulatory Visit: Payer: Self-pay

## 2021-06-25 ENCOUNTER — Telehealth (INDEPENDENT_AMBULATORY_CARE_PROVIDER_SITE_OTHER): Payer: No Payment, Other | Admitting: Psychiatry

## 2021-06-25 DIAGNOSIS — F4010 Social phobia, unspecified: Secondary | ICD-10-CM

## 2021-06-25 DIAGNOSIS — F333 Major depressive disorder, recurrent, severe with psychotic symptoms: Secondary | ICD-10-CM | POA: Diagnosis not present

## 2021-06-25 MED ORDER — SERTRALINE HCL 100 MG PO TABS
200.0000 mg | ORAL_TABLET | Freq: Every day | ORAL | 0 refills | Status: DC
  Filled 2021-06-25: qty 60, 30d supply, fill #0

## 2021-06-25 NOTE — Progress Notes (Signed)
BH MD/PA/NP OP Progress Note  06/25/2021 1:27 PM Kristen [name]  MRN:  [id number]  Virtual Visit via Video Note  I connected with [name] on 06/25/21 at  1:00 PM EDT by a video enabled telemedicine application and verified that I am speaking with the correct person using two identifiers.  Location: Patient: home Provider: offsite   I discussed the limitations of evaluation and management by telemedicine and the availability of in person appointments. The patient expressed understanding and agreed to proceed.     I discussed the assessment and treatment plan with the patient. The patient was provided an opportunity to ask questions and all were answered. The patient agreed with the plan and demonstrated an understanding of the instructions.   The patient was advised to call back or seek an in-person evaluation if the symptoms worsen or if the condition fails to improve as anticipated.  I provided 5 minutes of non-face-to-face time during this encounter.   Franne Grip, NP   Chief Complaint: Medication management  HPI: Kristen [name] is a 36 year old female presenting to [facility] behavioral health outpatient for follow-up psychiatric evaluation.  She has a psychiatric history of PTSD, major depressive disorder and anxiety.  Patient symptoms are managed with Zoloft 150 mg at bedtime, trazodone 100 mg at bedtime as needed for sleep, propanolol 20 mg twice daily, hydroxyzine 25 mg 3 times daily as needed for anxiety and rexulti 1 mg daily.  Patient reports that she has not been able to pick up [place] and is awaiting patient assistance program.  Patient confirms compliance with all medications and denies adverse medication effects.  Patient reports increased anxiety and states that her medications are somewhat effective.  Medication options discussed.  Patient agreeable to increasing Zoloft to 200 mg daily.  Medication benefits versus risks discussed.  Patient is  alert and oriented x4, calm, pleasant and willing to engage.  She reports could be better and a decreased appetite at times.  Patient denies suicidal homicidal ideations, paranoia, delusional thought, auditory or visual hallucinations.  Visit Diagnosis:    ICD-10-CM   1. Severe episode of recurrent major depressive disorder, with psychotic features (HCC)  F33.3 sertraline (ZOLOFT) 100 MG tablet    2. Social phobia  F40.10 sertraline (ZOLOFT) 100 MG tablet      Past Psychiatric History: PTSD, major depressive disorder and anxiety  Past Medical History:  Past Medical History:  Diagnosis Date   Anxiety 2013   treated at [facility] by [place]    no hospitalization, or intubation, previously on advair and singulair. asthma worsening.    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 , dx in utero    Depression 2001   depressed since childhood    Dysrhythmia 2010   PVC's   Essential hypertension 03/12/2019   GERD (gastroesophageal reflux disease)    History of hiatal hernia    History of suicidal ideation    IBS (irritable bowel syndrome)    Kyphosis of thoracic region 2004    painful, runs in dad side    Migraine    Pancreatitis 2003   gallstone pancreatitis    Pancreatitis due to biliary obstruction 2003   PCOS (polycystic ovarian syndrome) 2014   facial hair, irregular periods, never been pregnant    Sleep apnea    no longer uses cpap    Past Surgical History:  Procedure Laterality Date   BIOPSY  06/11/2021   Procedure: BIOPSY;  Surgeon: Mauri Pole, MD;  Location: [facility];  Service: Gastroenterology;;   BLADDER SURGERY     CHOLECYSTECTOMY  2003    COLONOSCOPY WITH PROPOFOL N/A 06/11/2021   Procedure: COLONOSCOPY WITH PROPOFOL;  Surgeon: Mauri Pole, MD;  Location: [facility];  Service: Gastroenterology;  Laterality: N/A;   DILATATION & CURETTAGE/HYSTEROSCOPY WITH MYOSURE N/A 12/14/2019   Procedure: DILATATION & CURETTAGE/HYSTEROSCOPY WITH  Polypectomy with MYOSURE;  Surgeon: Osborne Oman, MD;  Location: [place];  Service: Gynecology;  Laterality: N/A;   OPEN REDUCTION INTERNAL FIXATION (ORIF) PROXIMAL PHALANX Right 07/23/2013   Procedure: OPEN TREATMENT RIGHT RING FINGER, PROXIMAL INTERPHALANGEAL/JOINT FRACTURE DISLOCATION;  Surgeon: Jolyn Nap, MD;  Location: [place];  Service: Orthopedics;  Laterality: Right;   TONSILLECTOMY     and adenoidectomy    Family Psychiatric History: See below  Family History:  Family History  Problem Relation Age of Onset   Hypertension Mother    Arnold-Chiari malformation Mother    Restless legs syndrome Mother    Osteoporosis Mother    COPD Mother    Asthma Mother    Squamous cell carcinoma Mother        skin    Eczema Mother    Arthritis Mother    Peripheral Artery Disease Mother    Anxiety disorder Mother    OCD Mother    Colonic polyp Mother    Hypertension Father    Scoliosis Father    Bipolar disorder Father    Congestive Heart Failure Father    COPD Father    Depression Father    Diabetes Maternal Grandmother    Hypertension Maternal Grandmother    Dementia Maternal Grandmother    OCD Maternal Grandmother    Bone cancer Maternal Grandfather 82       bone marrow    Multiple myeloma Maternal Grandfather    Alcohol abuse Maternal Grandfather    Drug abuse Maternal Grandfather    Cancer Paternal Grandmother        colon   Diabetes Paternal Grandfather    Alcohol abuse Paternal Grandfather    Drug abuse Paternal Grandfather    Dementia Paternal Grandfather    Alcohol abuse Maternal Aunt    Depression Maternal Aunt    Pancreatic cancer Maternal Aunt    Alcohol abuse Paternal Aunt    Depression Paternal Aunt    ADD / ADHD Cousin     Social History:  Social History   Socioeconomic History   Marital status: Single    Spouse name: Not on file   Number of children: 0   Years of education: college    Highest education level: Not on file   Occupational History   Occupation: Unemployed   Tobacco Use   Smoking status: Never   Smokeless tobacco: Never  Vaping Use   Vaping Use: Never used  Substance and Sexual Activity   Alcohol use: No   Drug use: No   Sexual activity: Yes    Birth control/protection: None  Other Topics Concern   Not on file  Social History Narrative   Lives with mom [name])    Right-handed   Caffeine: 3 cups of coffee per day   Social Determinants of Health   Financial Resource Strain: Not on file  Food Insecurity: Food Insecurity Present   Worried About Charity fundraiser in the Last Year: Often true   [place] in the Last Year: Sometimes true  Transportation Needs: No Transportation  Needs   Lack of Transportation (Medical): No   Lack of Transportation (Non-Medical): No  Physical Activity: Not on file  Stress: Not on file  Social Connections: Not on file    Allergies:  Allergies  Allergen Reactions   Mucinex [Guaifenesin Er] Hives, Itching and Swelling   Penicillins Swelling    Did it involve swelling of the face/tongue/throat, SOB, or low BP? Yes Did it involve sudden or severe rash/hives, skin peeling, or any reaction on the inside of your mouth or nose? No Did you need to seek medical attention at a hospital or doctor's office? Yes When did it last happen?      >10 years ago If all above answers are "NO", may proceed with cephalosporin use.    Contrast Media [Iodinated Contrast Media] Nausea And Vomiting   Latex Hives and Swelling   Multihance [Gadobenate] Nausea And Vomiting   Other     Pickles - throat swelling and nausea     Metabolic Disorder Labs: Lab Results  Component Value Date   HGBA1C 4.9 04/15/2019   MPG 93.93 04/15/2019   Lab Results  Component Value Date   PROLACTIN 15.3 11/01/2020   PROLACTIN 20.5 07/14/2020   Lab Results  Component Value Date   CHOL 204 (H) 04/15/2019   TRIG 146 04/15/2019   HDL 41 04/15/2019   CHOLHDL 5.0 04/15/2019   VLDL  29 04/15/2019   LDLCALC 134 (H) 04/15/2019   Lab Results  Component Value Date   TSH 2.99 11/01/2020   TSH 4.628 (H) 04/15/2019    Therapeutic Level Labs: No results found for: LITHIUM No results found for: VALPROATE No components found for:  CBMZ  Current Medications: Current Outpatient Medications  Medication Sig Dispense Refill   albuterol (VENTOLIN HFA) 108 (90 Base) MCG/ACT inhaler Inhale 2 puffs into the lungs every 6 (six) hours as needed for wheezing or shortness of breath. 6.7 g 3   Benzocaine-Resorcinol (VAGISIL EX) Apply 1 application. topically 2 (two) times daily.     brexpiprazole (REXULTI) 1 MG TABS tablet Take 1 tablet (1 mg total) by mouth daily. 30 tablet 3   cholestyramine (QUESTRAN) 4 g packet Take 1 packet (4 g total) by mouth 3 (three) times daily. 90 each 3   cyclobenzaprine (FLEXERIL) 5 MG tablet Take 1 tablet (5 mg total) by mouth 2 (two) times daily as needed for muscle spasms. 30 tablet 0   diclofenac Sodium (VOLTAREN) 1 % GEL Apply 2 g topically 4 (four) times daily. 100 g 0   dicyclomine (BENTYL) 10 MG capsule Take 1 capsule by mouth 1 hour before meals as needed for diarrhea 90 capsule 2   gabapentin (NEURONTIN) 300 MG capsule TAKE 1 CAPSULE (300 MG TOTAL) BY MOUTH AT BEDTIME. (Patient taking differently: Take 300 mg by mouth at bedtime as needed (pain).) 30 capsule 3   hydrOXYzine (ATARAX) 25 MG tablet TAKE 1 TABLET (25 MG TOTAL) BY MOUTH 3 (THREE) TIMES DAILY AS NEEDED FOR ANXIETY. 90 tablet 2   ipratropium-albuterol (DUONEB) 0.5-2.5 (3) MG/3ML SOLN Take 3 mLs by nebulization every 4 (four) hours as needed.     lidocaine (LIDODERM) 5 % Place 1 patch onto the skin daily. Remove & Discard patch within 12 hours or as directed by MD 30 patch 0   loperamide (IMODIUM A-D) 2 MG tablet Take 2 mg by mouth 4 (four) times daily as needed for diarrhea or loose stools.     LORazepam (ATIVAN) 1 MG tablet Take 1 tablet (  1 mg total) by mouth every 8 (eight) hours as  needed for anxiety. 5 tablet 0   megestrol (MEGACE) 40 MG tablet Take 2 tablets (80 mg total) by mouth daily. Can increase to two tablets twice a day in the event of heavy bleeding 60 tablet 5   naproxen (NAPROSYN) 375 MG tablet Take 1 tablet (375 mg total) by mouth 2 (two) times daily with a meal. 20 tablet 0   omeprazole (PRILOSEC) 20 MG capsule Take 1 capsule (20 mg total) by mouth daily. 30 capsule 3   Phenazopyridine HCl (AZO URINARY PAIN RELIEF) 99.5 MG TABS Take 2 tablets by mouth 3 (three) times daily.     polyethylene glycol (GOLYTELY) 236 g solution Mix and drink following MD instructions-do not follow box instructions 4000 mL 0   propranolol (INDERAL) 20 MG tablet Take 1 tablet (20 mg total) by mouth 2 (two) times daily. For anxiety 60 tablet 2   sertraline (ZOLOFT) 100 MG tablet Take 2 tablets (200 mg total) by mouth at bedtime. 60 tablet 0   traZODone (DESYREL) 100 MG tablet Take 1 tablet (100 mg total) by mouth at bedtime as needed for sleep. 30 tablet 2   No current facility-administered medications for this visit.     Musculoskeletal: Strength & Muscle Tone: N/A virtual visit Gait & Station: N/A virtual visit Patient leans: N/A  Psychiatric Specialty Exam: Review of Systems  Psychiatric/Behavioral:  Positive for dysphoric mood. Negative for hallucinations, self-injury and suicidal ideas.   All other systems reviewed and are negative.  Last menstrual period 05/28/2021.There is no height or weight on file to calculate BMI.  General Appearance: NA  Eye Contact: N/A  Speech: Clear and coherent  Volume: Normal  Mood: Dysphoric  Affect:  NA  Thought Process:  Goal Directed  Orientation:  Full (Time, Place, and Person)  Thought Content: Logical   Suicidal Thoughts:  No  Homicidal Thoughts:  No  Memory: Good  Judgement: Good  Insight: Good  Psychomotor Activity: N/A  Concentration: Good  Recall: Good  Fund of Knowledge: Good  Language: Good  Akathisia: N/A  Handed:  Right  AIMS (if indicated): Not done  Assets:  Communication Skills Desire for Improvement  ADL's:  Intact  Cognition: WNL  Sleep:  Good   Screenings: AIMS    Flowsheet Row Admission (Discharged) from OP Visit from 04/14/2019 in [place] [facility]  AIMS Total Score 0      AUDIT    Flowsheet Row Admission (Discharged) from OP Visit from 04/14/2019 in [place] [facility]  Alcohol Use Disorder Identification Test Final Score (AUDIT) 0      GAD-7    Flowsheet Row Video Visit from 02/16/2021 in [facility] Video Visit from 11/14/2020 in [facility] Office Visit from 08/17/2020 in Center for [place] [facility] Video Visit from 08/14/2020 in [facility] Office Visit from 07/11/2020 in [place]  Total GAD-7 Score _0 PHQ2-9    Flowsheet Row Video Visit from 02/16/2021 in [facility] from 12/13/2020 in [facility] Video Visit from 11/14/2020 in [facility] Office Visit from 08/17/2020 in Center for [place] at [facility] Video Visit from 08/14/2020 in [facility]  PHQ-2 Total Score 6  _0 PHQ-9 Total Score _1 Flowsheet Row ED from 06/14/2021 in [place] Admission (Discharged) from 06/11/2021 in [place] ED from 01/07/2021 in [place] No Risk No Risk No Risk        Assessment and Plan: Kristen [name] is a 36 year old female presenting to [facility] behavioral health outpatient for follow-up psychiatric evaluation.  She has a psychiatric history of PTSD, major  depressive disorder and anxiety.  Patient symptoms are managed with Zoloft 150 mg at bedtime, trazodone 100 mg at bedtime as needed for sleep, propanolol 20 mg twice daily, hydroxyzine 25 mg 3 times daily as needed for anxiety and rexulti 1 mg daily.  Patient reports that she has not been able to pick up [place] and is awaiting patient assistance program.  Patient confirms compliance with all medications and denies adverse medication effects.  Patient reports increased anxiety and states that her medications are somewhat effective.  Medication options discussed.  Patient agreeable to increasing Zoloft to 200 mg daily.  Medication benefits versus risks discussed.  Remaining medications do not require refills today as patient has refills available at her preferred pharmacy.  Collaboration of Care: Collaboration of Care: Medication Management AEB Zoloft increased to 200 mg daily and E scribed to patient's preferred pharmacy.    1. Severe episode of recurrent major depressive disorder, with psychotic features (HCC) Continue trazodone 100 mg  - sertraline (ZOLOFT) 100 MG tablet; Take 2 tablets (200 mg total) by mouth at bedtime.  Dispense: 60 tablet; Refill: 0  2. Social phobia Continue propanolol 20 mg twice daily Continue hydroxyzine 25 mg 3 times daily as needed for anxiety - sertraline (ZOLOFT) 100 MG tablet; Take 2 tablets (200 mg total) by mouth at bedtime.  Dispense: 60 tablet; Refill: 0    Return to care in 4 weeks Continue therapy   Patient/Guardian was advised Release of Information must be obtained prior to any record release in order to collaborate their care with an outside provider. Patient/Guardian was advised if they have not already done so to contact the registration department to sign all necessary forms in order for [place] to release information regarding their care.   Consent: Patient/Guardian gives verbal consent for treatment and assignment of benefits for services provided during  this visit. Patient/Guardian expressed understanding and agreed to proceed.    Franne Grip, NP 06/25/2021, 1:27 PM

## 2021-06-27 ENCOUNTER — Other Ambulatory Visit: Payer: Self-pay

## 2021-06-27 ENCOUNTER — Inpatient Hospital Stay: Payer: Self-pay | Attending: Oncology | Admitting: Oncology

## 2021-06-27 VITALS — BP 135/94 | HR 69 | Temp 97.5°F | Resp 20 | Wt 309.3 lb

## 2021-06-27 DIAGNOSIS — Z79899 Other long term (current) drug therapy: Secondary | ICD-10-CM | POA: Insufficient documentation

## 2021-06-27 DIAGNOSIS — N920 Excessive and frequent menstruation with regular cycle: Secondary | ICD-10-CM | POA: Insufficient documentation

## 2021-06-27 DIAGNOSIS — R58 Hemorrhage, not elsewhere classified: Secondary | ICD-10-CM
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
## 2021-06-28 ENCOUNTER — Other Ambulatory Visit: Payer: Self-pay

## 2021-06-28 ENCOUNTER — Other Ambulatory Visit: Payer: Self-pay | Admitting: Emergency Medicine

## 2021-06-29 ENCOUNTER — Other Ambulatory Visit: Payer: Self-pay

## 2021-07-10 ENCOUNTER — Ambulatory Visit (INDEPENDENT_AMBULATORY_CARE_PROVIDER_SITE_OTHER): Payer: No Payment, Other | Admitting: Licensed Clinical Social Worker

## 2021-07-10 DIAGNOSIS — F4310 Post-traumatic stress disorder, unspecified: Secondary | ICD-10-CM

## 2021-07-10 DIAGNOSIS — F333 Major depressive disorder, recurrent, severe with psychotic symptoms: Secondary | ICD-10-CM

## 2021-07-10 NOTE — Plan of Care (Signed)
Kristen [name] agrees to the following Plan of Care: Problem:  Depression Goal: Kristen [name] will experience a decrease in her depressive symptoms as evidenced by her PHQ-9 score dropping from a 20 to a 4 or less by 01/09/22.  Outcome: Not Applicable   Problem:  Anxiety Goal:  Kristen [name] will experience a decrease in her anxiety symptoms as evidenced by her GAD-7 score dropping from a 16 to a 4 or less by 01/09/22.  Outcome: Not Applicable
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
## 2021-07-11 ENCOUNTER — Other Ambulatory Visit: Payer: Self-pay

## 2021-07-11 ENCOUNTER — Ambulatory Visit (INDEPENDENT_AMBULATORY_CARE_PROVIDER_SITE_OTHER): Payer: Self-pay | Admitting: Physician Assistant

## 2021-07-11 ENCOUNTER — Encounter: Payer: Self-pay | Admitting: Physician Assistant

## 2021-07-11 VITALS — BP 120/90 | HR 87 | Ht 66.0 in | Wt 306.0 lb

## 2021-07-11 DIAGNOSIS — K529 Noninfective gastroenteritis and colitis, unspecified: Secondary | ICD-10-CM

## 2021-07-11 DIAGNOSIS — R109 Unspecified abdominal pain: Secondary | ICD-10-CM

## 2021-07-11 MED ORDER — CHOLESTYRAMINE 4 G PO PACK
PACK | ORAL | 6 refills | Status: DC
Start: 2021-07-11 — End: 2022-12-20
  Filled 2021-07-11: qty 60, 30d supply, fill #0

## 2021-07-11 NOTE — Progress Notes (Signed)
Subjective:    Patient ID: Kristen [name], female    DOB: 01/31/1986, 36 y.o.   MRN: [id number]  HPI Kristen [name] is a pleasant 36 year old white female, established with Dr. Silverio Decamp.  She was last seen in the office in February 2023 by myself.  She has long history of chronic diarrhea with postprandial urgency for bowel movements, and ongoing issues with cramping and gas.  It has been felt that her diarrhea is in part bile salt induced status post very remote cholecystectomy at age 36.  She has had some issues with diarrhea ever since then.  However over the past couple of years she has had increase in symptoms, and now says that she has had worsening of symptoms over the past few months.  After the last office visit we had increased Questran to 4 g twice daily, she has dicyclomine to use 10 mg as needed, and has been using Imodium as needed. She underwent colonoscopy on 06/11/2021 to rule out underlying IBD microscopic colitis etc.  Colonoscopy was normal, random biopsies did show some mild edema and a few lamina propria neutrophils, but negative for microscopic colitis and no chronicity of features.  Today she comes in with her mom stating that she will have 6-12 bowel movements per day without Imodium.  She says she is taking the Questran twice daily and will still have 6-12 bowel movements.  With Imodium about 4/day she will have 4-6 bowel movements per day.  Stools are urgent postprandially and frequently associated with cramping.  She says she has ongoing problems with some gas and bloating.  She has been avoiding milk and most dairy products, and also feels that greens like spinach and kale will aggravate her symptoms so has been avoiding. She has not been taking the Bentyl on any kind of regular basis. Other meds reviewed and has been on a stable regimen otherwise for the past couple of years. Previous TTG and IgA negative,, fecal calprotectin negative  She is asking about possibility of  Crohn's disease, She does have diagnoses of Ehlers-Danlos and wonders if this could have any effect on her diarrhea. Other medical history pertinent for migraines, hypertension, pots syndrome, sleep apnea with CPAP use, PCOS, Ehlers-Danlos, ADD, PTSD and morbid obesity.  Review of Systems. Pertinent positive and negative review of systems were noted in the above HPI section.  All other review of systems was otherwise negative.   Outpatient Encounter Medications as of 07/11/2021  Medication Sig   albuterol (VENTOLIN HFA) 108 (90 Base) MCG/ACT inhaler Inhale 2 puffs into the lungs every 6 (six) hours as needed for wheezing or shortness of breath.   Benzocaine-Resorcinol (VAGISIL EX) Apply 1 application. topically 2 (two) times daily.   brexpiprazole (REXULTI) 1 MG TABS tablet Take 1 tablet (1 mg total) by mouth daily.   cyclobenzaprine (FLEXERIL) 5 MG tablet Take 1 tablet (5 mg total) by mouth 2 (two) times daily as needed for muscle spasms.   diclofenac Sodium (VOLTAREN) 1 % GEL Apply 2 g topically 4 (four) times daily.   dicyclomine (BENTYL) 10 MG capsule Take 1 capsule by mouth 1 hour before meals as needed for diarrhea   hydrOXYzine (ATARAX) 25 MG tablet TAKE 1 TABLET (25 MG TOTAL) BY MOUTH 3 (THREE) TIMES DAILY AS NEEDED FOR ANXIETY.   ipratropium-albuterol (DUONEB) 0.5-2.5 (3) MG/3ML SOLN Take 3 mLs by nebulization every 4 (four) hours as needed.   lidocaine (LIDODERM) 5 % Place 1 patch onto the skin daily. Remove &  Discard patch within 12 hours or as directed by MD   loperamide (IMODIUM A-D) 2 MG tablet Take 2 mg by mouth 4 (four) times daily as needed for diarrhea or loose stools.   LORazepam (ATIVAN) 1 MG tablet Take 1 tablet (1 mg total) by mouth every 8 (eight) hours as needed for anxiety.   megestrol (MEGACE) 40 MG tablet Take 2 tablets (80 mg total) by mouth daily. Can increase to two tablets twice a day in the event of heavy bleeding   naproxen (NAPROSYN) 375 MG tablet Take 1 tablet (375  mg total) by mouth 2 (two) times daily with a meal.   omeprazole (PRILOSEC) 20 MG capsule Take 1 capsule (20 mg total) by mouth daily.   Phenazopyridine HCl (AZO URINARY PAIN RELIEF) 99.5 MG TABS Take 2 tablets by mouth 3 (three) times daily.   propranolol (INDERAL) 20 MG tablet Take 1 tablet (20 mg total) by mouth 2 (two) times daily. For anxiety   sertraline (ZOLOFT) 100 MG tablet Take 2 tablets (200 mg total) by mouth at bedtime.   traZODone (DESYREL) 100 MG tablet Take 1 tablet (100 mg total) by mouth at bedtime as needed for sleep.   [DISCONTINUED] cholestyramine (QUESTRAN) 4 g packet Take 1 packet (4 g total) by mouth 3 (three) times daily.   cholestyramine (QUESTRAN) 4 g packet Take 1 packet twice daily between meals   gabapentin (NEURONTIN) 300 MG capsule TAKE 1 CAPSULE (300 MG TOTAL) BY MOUTH AT BEDTIME. (Patient taking differently: Take 300 mg by mouth at bedtime as needed (pain).)   No facility-administered encounter medications on file as of 07/11/2021.   Allergies  Allergen Reactions   Mucinex [Guaifenesin Er] Hives, Itching and Swelling   Penicillins Swelling    Did it involve swelling of the face/tongue/throat, SOB, or low BP? Yes Did it involve sudden or severe rash/hives, skin peeling, or any reaction on the inside of your mouth or nose? No Did you need to seek medical attention at a hospital or doctor's office? Yes When did it last happen?      >10 years ago If all above answers are "NO", may proceed with cephalosporin use.    Contrast Media [Iodinated Contrast Media] Nausea And Vomiting   Latex Hives and Swelling   Multihance [Gadobenate] Nausea And Vomiting   Other     Pickles - throat swelling and nausea    Patient Active Problem List   Diagnosis Date Noted   Diarrhea    Chronic diarrhea    Weight gain 11/01/2020   Hirsutism 11/01/2020   Gastroesophageal reflux disease without esophagitis 07/11/2020   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   POTS (postural orthostatic  tachycardia syndrome) 06/09/2020   Morbid obesity ([place]) 12/14/2019   Endometrial polyp    Moderate episode of recurrent major depressive disorder ([place]) 11/18/2019   Encounter for autism screening 11/18/2019   Abnormal uterine bleeding (AUB) 11/08/2019   Severe episode of recurrent major depressive disorder, with psychotic features ([place]) 09/20/2019   PTSD (post-traumatic stress disorder) 09/20/2019   Irritable bowel syndrome with diarrhea 04/23/2019   Abnormal serum thyroid stimulating hormone (TSH) level 04/23/2019   MDD (major depressive disorder), recurrent severe, without psychosis ([place]) 04/15/2019   OSA on CPAP 03/17/2019   Anxiety and depression 03/12/2019   Chronic migraine with aura 03/12/2019   Essential hypertension 03/12/2019   Mild intermittent asthma without complication 26/94/8546   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   Elevated prolactin level 07/05/2016   Vulvar lesion 07/05/2016  Restless legs 05/24/2016   Seasonal allergies 05/13/2014   Intertrigo 05/13/2014   Decreased vision 05/13/2014   PCOS (polycystic ovarian syndrome) 02/07/2014   Social phobia 02/02/2007   ADD 02/02/2007   DYSLEXIA 02/02/2007   Asthma, severe persistent 02/02/2007   SCOLIOSIS 02/02/2007   OTHER SPECIFIED CONGENITAL ANOMALIES OF KIDNEY 02/02/2007   ELECTROCARDIOGRAM, ABNORMAL 02/02/2007   Social History   Socioeconomic History   Marital status: Single    Spouse name: Not on file   Number of children: 0   Years of education: college    Highest education level: Not on file  Occupational History   Occupation: Unemployed   Tobacco Use   Smoking status: Never   Smokeless tobacco: Never  Vaping Use   Vaping Use: Never used  Substance and Sexual Activity   Alcohol use: No   Drug use: No   Sexual activity: Yes    Birth control/protection: None  Other Topics Concern   Not on file  Social History Narrative   Lives with mom [name])    Right-handed   Caffeine: 3 cups of  coffee per day   Social Determinants of Health   Financial Resource Strain: Not on file  Food Insecurity: Food Insecurity Present   Worried About Charity fundraiser in the Last Year: Often true   Arboriculturist in the Last Year: Sometimes true  Transportation Needs: No Transportation Needs   Lack of Transportation (Medical): No   Lack of Transportation (Non-Medical): No  Physical Activity: Not on file  Stress: Not on file  Social Connections: Not on file  Intimate Partner Violence: Not on file    Ms. Lorman's family history includes ADD / ADHD in her cousin; Alcohol abuse in her maternal aunt, maternal grandfather, paternal aunt, and paternal grandfather; Anxiety disorder in her mother; Arnold-Chiari malformation in her mother; Arthritis in her mother; Asthma in her mother; Bipolar disorder in her father; Bone cancer (age of onset: 41) in her maternal grandfather; COPD in her father and mother; Cancer in her paternal grandmother; Colonic polyp in her mother; Congestive Heart Failure in her father; Dementia in her maternal grandmother and paternal grandfather; Depression in her father, maternal aunt, and paternal aunt; Diabetes in her maternal grandmother and paternal grandfather; Drug abuse in her maternal grandfather and paternal grandfather; Eczema in her mother; Hypertension in her father, maternal grandmother, and mother; Multiple myeloma in her maternal grandfather; OCD in her maternal grandmother and mother; Osteoporosis in her mother; Pancreatic cancer in her maternal aunt; Peripheral Artery Disease in her mother; Restless legs syndrome in her mother; Scoliosis in her father; Squamous cell carcinoma in her mother.      Objective:    Vitals:   07/11/21 1355  BP: 120/90  Pulse: 87    Physical Examv Well-developed well-nourished white female in no acute distress.  Accompanied by her mother  Weight, 306 BMI 49.3  HEENT; nontraumatic normocephalic, EOMI, PE R LA, sclera  anicteric. Oropharynx; not examined today Neck; supple, no JVD Cardiovascular; regular rate and rhythm with S1-S2, no murmur rub or gallop Pulmonary; Clear bilaterally Abdomen; soft, obese mild non focal tenderness in the mid abd, non distended, no palpable mass or hepatosplenomegaly, bowel sounds are active Rectal; not done today Skin; benign exam, no jaundice rash or appreciable lesions Extremities; no clubbing cyanosis or edema skin warm and dry Neuro/Psych; alert and oriented x4, grossly nonfocal mood and affect appropriate        Assessment & Plan:   #  33 36 year old white female with chronic diarrhea times many years, some worsening over the past year. Colonoscopy May 2023 normal, random biopsies negative Celiac markers negative [place]elt to have component of bile salt induced diarrhea however she does not seem to be getting much effect with Questran 4 g twice daily I suspect a lot of this is IBS-D. Will rule out pancreatic insufficiency Rule out SIBO   #2 Ehlers-Danlos syndrome 3.  Morbid obesity 4.  PTSD/ADD 5.  PCOS 6.  Sleep apnea with CPAP use 7.  POTS syndrome 8.  Migraines 9.  Hypertension  Plan; continue Questran 4 g twice daily, advised to take away from her other medications Advised to start Bentyl 10 mg p.o. 3 times daily on a scheduled basis 30 to 40 minutes before meals Lactose-free diet, avoid any other trigger foods i.e. spinach kale etc. We will proceed with breath testing for SIBO Pancreatic fecal elastase GI path panel given worsening of symptoms  If all of the above unrevealing, would consider an empiric course of Xifaxan, and consider abdominal imaging.  Dayan Desa Genia Harold PA-C 07/11/2021   Cc: Ladell Pier, MD
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
## 2021-07-11 NOTE — Patient Instructions (Addendum)
If you are age 34 or younger, your body mass index should be between 19-25. Your Body mass index is 49.39 kg/m. If this is out of the aformentioned range listed, please consider follow up with your Primary Care Provider.  ________________________________________________________  The [facility] providers would like to encourage you to use [facility] to communicate with providers for non-urgent requests or questions.  Due to long hold times on the telephone, sending your provider a message by [facility] may be a faster and more efficient way to get a response.  Please allow 48 business hours for a response.  Please remember that this is for non-urgent requests.  _______________________________________________________  Dennis Bast have been given a testing kit to check for small intestine bacterial overgrowth (SIBO) which is completed by a company named [employer]. Make sure to return your test in the mail using the return mailing label given to you along with the kit. Your demographic and insurance information have already been sent to the company and they should be in contact with you over the next 2-3 weeks regarding this test. [facility] will collect an upfront charge of $99.74 for commercial insurance plans and $209.74 is you are paying cash. Make sure to discuss with [facility] PRIOR to having the test to see if they have gotten information from your insurance company as to how much your testing will cost out of pocket, if any. Please keep in mind that you will be getting a call from phone number [phone] or a similar number. If you do not hear from them within this time frame, please call our office at [phone] or call [facility] directly at [phone].   Your provider has requested that you go to the basement level for lab work before leaving today. Press "B" on the elevator. The lab is located at the first door on the left as you exit the elevator.  Continue taking Dicyclomine 10  mg three times daily 30-40 minutes prior to meals Cholestyramine 4 grams twice daily between meals  You may use Imodium up to 6 tablets daily.  Follow a Lactose free diet.  Thank you for entrusting me with your care and choosing [facility].  [name] Esterwood, PA-C   Lactose-Free Diet, Adult If you have lactose intolerance, you are not able to digest lactose. Lactose is a natural sugar found mainly in dairy milk and dairy products. A lactose-free diet can help you avoid foods and beverages that contain lactose. What are tips for following this plan? Reading food labels Do not consume foods, beverages, vitamins, minerals, or medicines containing lactose. Read ingredient lists carefully. Look for the words "lactose-free" on labels. Meal planning Use alternatives to dairy milk and foods made with milk products. These include the following: Lactose-free milk. Soy milk with added calcium and vitamin D. Almond milk, coconut milk, rice milk, or other nondairy milk alternatives with added calcium and vitamin D. Note that a lot of these are low in protein. Soy products, such as soy yogurt, soy cheese, soy ice cream, and soy-based sour cream. Other nut milk products, such as almond yogurt, almond cheese, cashew yogurt, cashew cheese, cashew ice cream, coconut yogurt, and coconut ice cream. Medicines, vitamins, and supplements Use lactase enzyme drops or tablets as directed by your health care provider. Make sure you get enough calcium and vitamin D in your diet. A lactose-free eating plan can be lacking in these important nutrients. Take calcium and vitamin D supplements as directed by your health care provider. Talk with your  health care provider about supplements if you are not able to get enough calcium and vitamin D from food. What foods should I eat?  Fruits All fresh, canned, frozen, or dried fruits and fruit juices that are not processed with lactose. Vegetables All fresh, frozen,  and canned vegetables without cheese, cream, or butter sauces. Grains Any that are not made with dairy milk or dairy products. Meats and other proteins Any meat, fish, poultry, and other protein sources that are not made with dairy milk or dairy products. Fats and oils Any that are not made with dairy milk or dairy products. Sweets and desserts Any that are not made with dairy milk or dairy products. Seasonings and condiments Any that are not made with dairy milk or dairy products. Calcium Calcium is found in many foods that contain lactose and is important for bone health. The amount of calcium you need depends on your age: Adults younger than 50 years: 1,000 mg of calcium a day. Adults older than 50 years: 1,200 mg of calcium a day. If you are not getting enough calcium, you may get it from other sources, including: Orange juice that has been fortified with calcium. This means that calcium has been added to the product. There are 300-350 mg of calcium in 1 cup (237 mL) of calcium-fortified orange juice. Soy milk fortified with calcium. There are 300-400 mg of calcium in 1 cup (237 mL) of calcium-fortified soy milk. Rice or almond milk fortified with calcium. There are 300 mg of calcium in 1 cup (237 mL) of calcium-fortified rice or almond milk. Breakfast cereals fortified with calcium. There are 100-1,000 mg of calcium in calcium-fortified breakfast cereals. Spinach, cooked. There are 145 mg of calcium in  cup (90 g) of cooked spinach. Edamame, cooked. There are 130 mg of calcium in  cup (47 g) of cooked edamame. Collard greens, cooked. There are 125 mg of calcium in  cup (85 g) of cooked collard greens. Kale, frozen or cooked. There are 90 mg of calcium in  cup (59 g) of cooked or frozen kale. Almonds. There are 95 mg of calcium in  cup (35 g) of almonds. Broccoli, cooked. There are 60 mg of calcium in 1 cup (156 g) of cooked broccoli. The items listed above may not be a complete  list of foods and beverages you can eat. Contact a dietitian for more options. What foods should I avoid? Lactose is found in dairy milk and dairy products, such as: Yogurt. Cheese. Butter. Margarine. Sour cream. Cream. Whipped toppings and creamers. Ice cream and other dairy-based desserts. Lactose is also found in foods or products made with dairy milk or milk ingredients. To find out whether a food contains dairy milk or a milk ingredient, look at the ingredients list. Avoid foods with the statement "May contain milk" and foods that contain: Milk powder. Whey. Curd. Lactose. Lactoglobulin. The items listed above may not be a complete list of foods and beverages to avoid. Contact a dietitian for more information. Where to find more information [employer] of Diabetes and Digestive and Kidney Diseases: [email or web address] Summary If you are lactose intolerant, it means that you are not able to digest lactose, a natural sugar found in milk and milk products. Following a lactose-free diet can help you manage this condition. Calcium is important for bone health and is found in many foods that contain lactose. Talk with your health care provider about other sources of calcium. This information is not intended to replace  advice given to you by your health care provider. Make sure you discuss any questions you have with your health care provider. Document Revised: 12/28/2019 Document Reviewed: 12/28/2019 Elsevier Patient Education  [place].

## 2021-07-12 ENCOUNTER — Ambulatory Visit (INDEPENDENT_AMBULATORY_CARE_PROVIDER_SITE_OTHER): Payer: Self-pay

## 2021-07-12 ENCOUNTER — Encounter: Payer: Self-pay | Admitting: Nurse Practitioner

## 2021-07-12 ENCOUNTER — Inpatient Hospital Stay: Payer: Self-pay | Admitting: Physician Assistant

## 2021-07-12 ENCOUNTER — Ambulatory Visit (INDEPENDENT_AMBULATORY_CARE_PROVIDER_SITE_OTHER): Payer: Self-pay | Admitting: Nurse Practitioner

## 2021-07-12 VITALS — BP 132/82 | HR 75 | Ht 66.0 in | Wt 304.2 lb

## 2021-07-12 DIAGNOSIS — R002 Palpitations: Secondary | ICD-10-CM

## 2021-07-12 DIAGNOSIS — I951 Orthostatic hypotension: Secondary | ICD-10-CM

## 2021-07-12 NOTE — Progress Notes (Unsigned)
Enrolled for [employer] to mail a ZIO XT long term holter monitor to the patients address on file.  ? ?Dr. Pemberton to read. ?

## 2021-07-12 NOTE — Patient Instructions (Signed)
Medication Instructions:  Your physician recommends that you continue on your current medications as directed. Please refer to the Current Medication list given to you today.  *If you need a refill on your cardiac medications before your next appointment, please call your pharmacy*  Lab Work: If you have labs (blood work) drawn today and your tests are completely normal, you will receive your results only by: [place] (if you have MyChart) OR A paper copy in the mail If you have any lab test that is abnormal or we need to change your treatment, we will call you to review the results.  Testing/Procedures: [place]- Long Term Monitor Instructions  Your physician has requested you wear a ZIO patch monitor for 14 days.  This is a single patch monitor. [employer] supplies one patch monitor per enrollment. Additional stickers are not available. Please do not apply patch if you will be having a Nuclear Stress Test,  Echocardiogram, Cardiac CT, MRI, or Chest Xray during the period you would be wearing the  monitor. The patch cannot be worn during these tests. You cannot remove and re-apply the  ZIO XT patch monitor.  Your ZIO patch monitor will be mailed 3 day USPS to your address on file. It may take 3-5 days  to receive your monitor after you have been enrolled.  Once you have received your monitor, please review the enclosed instructions. Your monitor  has already been registered assigning a specific monitor serial # to you.  Billing and Patient Assistance Program Information  We have supplied [employer] with any of your insurance information on file for billing purposes. [employer] offers a sliding scale Patient Assistance Program for patients that do not have  insurance, or whose insurance does not completely cover the cost of the ZIO monitor.  You must apply for the Patient Assistance Program to qualify for this discounted rate.  To apply, please call [employer] at [phone], select option  4, select option 2, ask to apply for  Patient Assistance Program. Theodore Demark will ask your household income, and how many people  are in your household. They will quote your out-of-pocket cost based on that information.  [employer] will also be able to set up a 63-month interest-free payment plan if needed.  Applying the monitor   Shave hair from upper left chest.  Hold abrader disc by orange tab. Rub abrader in 40 strokes over the upper left chest as  indicated in your monitor instructions.  Clean area with 4 enclosed alcohol pads. Let dry.  Apply patch as indicated in monitor instructions. Patch will be placed under collarbone on left  side of chest with arrow pointing upward.  Rub patch adhesive wings for 2 minutes. Remove white label marked "1". Remove the white  label marked "2". Rub patch adhesive wings for 2 additional minutes.  While looking in a mirror, press and release button in center of patch. A small green light will  flash 3-4 times. This will be your only indicator that the monitor has been turned on.  Do not shower for the first 24 hours. You may shower after the first 24 hours.  Press the button if you feel a symptom. You will hear a small click. Record Date, Time and  Symptom in the Patient Logbook.  When you are ready to remove the patch, follow instructions on the last 2 pages of Patient  Logbook. Stick patch monitor onto the last page of Patient Logbook.  Place Patient Logbook in the blue and  white box. Use locking tab on box and tape box closed  securely. The blue and white box has prepaid postage on it. Please place it in the mailbox as  soon as possible. Your physician should have your test results approximately 7 days after the  monitor has been mailed back to [facility].  Call [facility] Customer Care at [phone] if you have questions regarding  your ZIO XT patch monitor. Call them immediately if you see an orange light blinking on your  monitor.  If  your monitor falls off in less than 4 days, contact our Monitor department at [phone].  If your monitor becomes loose or falls off after 4 days call [employer] at [phone] for  suggestions on securing your monitor    Follow-Up: At [facility], you and your health needs are our priority.  As part of our continuing mission to provide you with exceptional heart care, we have created designated Provider Care Teams.  These Care Teams include your primary Cardiologist (physician) and Advanced Practice Providers (APPs -  Physician Assistants and Nurse Practitioners) who all work together to provide you with the care you need, when you need it.  We recommend signing up for the patient portal called "MyChart".  Sign up information is provided on this After Visit Summary.  MyChart is used to connect with patients for Virtual Visits (Telemedicine).  Patients are able to view lab/test results, encounter notes, upcoming appointments, etc.  Non-urgent messages can be sent to your provider as well.   To learn more about what you can do with MyChart, go to [email or web address].    Your next appointment:   1 year(s)  The format for your next appointment:   In Person  Provider:   Meriam Sprague, MD {   Important Information About Sugar

## 2021-07-18 ENCOUNTER — Other Ambulatory Visit: Payer: Self-pay

## 2021-07-19 DIAGNOSIS — R002 Palpitations: Secondary | ICD-10-CM

## 2021-07-19 DIAGNOSIS — I951 Orthostatic hypotension: Secondary | ICD-10-CM

## 2021-07-23 ENCOUNTER — Encounter (HOSPITAL_COMMUNITY): Payer: Self-pay | Admitting: Psychiatry

## 2021-07-23 ENCOUNTER — Telehealth (INDEPENDENT_AMBULATORY_CARE_PROVIDER_SITE_OTHER): Payer: No Payment, Other | Admitting: Psychiatry

## 2021-07-23 ENCOUNTER — Other Ambulatory Visit: Payer: Self-pay

## 2021-07-23 DIAGNOSIS — F4010 Social phobia, unspecified: Secondary | ICD-10-CM | POA: Diagnosis not present

## 2021-07-23 DIAGNOSIS — F333 Major depressive disorder, recurrent, severe with psychotic symptoms: Secondary | ICD-10-CM

## 2021-07-23 MED ORDER — BREXPIPRAZOLE 1 MG PO TABS
1.0000 mg | ORAL_TABLET | Freq: Every day | ORAL | 3 refills | Status: DC
  Filled 2021-07-23 – 2021-08-01 (×2): qty 30, 30d supply, fill #0

## 2021-07-23 MED ORDER — SERTRALINE HCL 100 MG PO TABS
200.0000 mg | ORAL_TABLET | Freq: Every day | ORAL | 3 refills | Status: DC
  Filled 2021-07-23: qty 60, 30d supply, fill #0

## 2021-07-23 MED ORDER — HYDROXYZINE HCL 25 MG PO TABS
ORAL_TABLET | Freq: Three times a day (TID) | ORAL | 3 refills | Status: DC | PRN
  Filled 2021-07-23: qty 90, 30d supply, fill #0

## 2021-07-23 MED ORDER — TRAZODONE HCL 100 MG PO TABS
100.0000 mg | ORAL_TABLET | Freq: Every evening | ORAL | 3 refills | Status: DC | PRN
  Filled 2021-07-23: qty 30, 30d supply, fill #0
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
## 2021-07-30 ENCOUNTER — Other Ambulatory Visit: Payer: Self-pay

## 2021-08-01 ENCOUNTER — Other Ambulatory Visit: Payer: Self-pay

## 2021-08-08 ENCOUNTER — Other Ambulatory Visit: Payer: Self-pay

## 2021-08-10 ENCOUNTER — Other Ambulatory Visit: Payer: Self-pay

## 2021-08-14 ENCOUNTER — Ambulatory Visit (INDEPENDENT_AMBULATORY_CARE_PROVIDER_SITE_OTHER): Payer: No Payment, Other | Admitting: Licensed Clinical Social Worker

## 2021-08-14 DIAGNOSIS — F333 Major depressive disorder, recurrent, severe with psychotic symptoms: Secondary | ICD-10-CM

## 2021-08-14 NOTE — Progress Notes (Signed)
THERAPIST PROGRESS NOTE  Session Time: 3 p.m. to  3: 55 p.m. EST  Virtual Visit via Video Note   I connected with Lysandra at 3 p.m. EST by a video enabled telemedicine application and verified that I am speaking with the correct person using two identifiers.   Location: Patient: home Provider: [facility] office   I discussed the limitations of evaluation and management by telemedicine and the availability of in person appointments. The patient expressed understanding and agreed to proceed.  Type of Therapy: Individual   Therapist Response/Interventions: Solution Focused/The therapist informs Carlise that she can make an APS report if her mother declines to do so and that the reports are anonymous should the need arise to make a report.  The therapist notes that without home health for her mother or her father at some point perhaps going to some sort of placement that things are not likely to change. The therapist educates her on income-based housing should she get her disability which would also be a possibility for her mother who is also on disability.   He also inquiries as to if she has sought help from community agencies such as Holiday representative, Catering manager. He makes her aware of the Scholarship Program at [employer].   Treatment Goals addressed: Lakeyn will experience a decrease in her depressive symptoms as evidenced by her PHQ-9 score dropping from a 20 to a 4 or less by 01/09/22.   (CCA done 12/13/20)  Summary: Viney has not as of yet applied for Medicaid saying that she still needs to do it.   She says that her mom recently had her bottom teeth pulled and a stroke about 3-4 weeks ago.Her mom's doctor said that same thing about calling APS regarding her father; however, her mom says that she would feel guilty doing so. Udell says that their car is still being worked on.   Abrie recently had to wear a heart monitor because of POTS. She was supposed to do testing kits for  IBS but has not done so as she does things for other while neglecting herself   She says that she sometimes has a burst of energy and then no energy at all. Her depression is worse now at an "8.5." She sometimes feels hopeless asking, "why is it always me?"   She thinks about suicide "maybe once a week" with the last time being yesterday noting that she would never do it. She is sharing a car with her fiance noting that "98%" of her problems would be fixed if she had money. She would buy her family new phones and get dental work done as she cannot afford the $45 fee even though she has the orange card.   She is currently taking both her Zoloft and Rexulti. Jarelis has a friend she has known since 1st grade and two friends from high school. The reason that her friends do not come around is because her dad and she does not go around them as she does not have money to do things. She talks to her friends via Internet or text maybe four times per month.   She says that drawing, reading, and gardening help give her a reprieve from this situation. She used to love to swim but cannot afford it.   Progress Towards Goals: Not progressing  Suicidal/Homicidal: No suicidal or homicidal ideation reported as of today  Plan: Return again in 5 weeks.  Diagnosis: Severe episode of recurrent major depressive disorder, with psychotic  features [facility]) and PTSD  Collaboration of Care: Other N/A  Patient/Guardian was advised Release of Information must be obtained prior to any record release in order to collaborate their care with an outside provider. Patient/Guardian was advised if they have not already done so to contact the registration department to sign all necessary forms in order for [place] to release information regarding their care.   Consent: Patient/Guardian gives verbal consent for treatment and assignment of benefits for services provided during this visit. Patient/Guardian expressed understanding and agreed  to proceed.   Myrna Blazer, MA, LCSW, [facility], LCAS 08/14/2021

## 2021-09-10 ENCOUNTER — Other Ambulatory Visit: Payer: Self-pay

## 2021-09-18 ENCOUNTER — Encounter (HOSPITAL_COMMUNITY): Payer: Self-pay

## 2021-09-18 ENCOUNTER — Ambulatory Visit (HOSPITAL_COMMUNITY): Payer: No Payment, Other | Admitting: Licensed Clinical Social Worker

## 2021-10-23 ENCOUNTER — Telehealth (INDEPENDENT_AMBULATORY_CARE_PROVIDER_SITE_OTHER): Payer: No Payment, Other | Admitting: Psychiatry

## 2021-10-23 ENCOUNTER — Other Ambulatory Visit: Payer: Self-pay

## 2021-10-23 ENCOUNTER — Encounter (HOSPITAL_COMMUNITY): Payer: Self-pay | Admitting: Psychiatry

## 2021-10-23 DIAGNOSIS — F333 Major depressive disorder, recurrent, severe with psychotic symptoms: Secondary | ICD-10-CM | POA: Diagnosis not present

## 2021-10-23 DIAGNOSIS — F4010 Social phobia, unspecified: Secondary | ICD-10-CM

## 2021-10-23 MED ORDER — HYDROXYZINE HCL 25 MG PO TABS
ORAL_TABLET | Freq: Three times a day (TID) | ORAL | 3 refills | Status: DC | PRN
  Filled 2021-10-23: qty 90, 30d supply, fill #0

## 2021-10-23 MED ORDER — SERTRALINE HCL 100 MG PO TABS
200.0000 mg | ORAL_TABLET | Freq: Every day | ORAL | 3 refills | Status: DC
  Filled 2021-10-23: qty 60, 30d supply, fill #0

## 2021-10-23 MED ORDER — BREXPIPRAZOLE 1 MG PO TABS
1.0000 mg | ORAL_TABLET | Freq: Every day | ORAL | 3 refills | Status: DC
  Filled 2021-10-23: qty 30, 30d supply, fill #0

## 2021-10-23 MED ORDER — TRAZODONE HCL 100 MG PO TABS
100.0000 mg | ORAL_TABLET | Freq: Every evening | ORAL | 3 refills | Status: DC | PRN
  Filled 2021-10-23: qty 30, 30d supply, fill #0

## 2021-10-23 NOTE — Progress Notes (Signed)
[place] MD/PA/NP OP Progress Note Virtual Visit via Video Note  I connected with Kristen [name] on 10/23/21 at  2:00 PM EDT by a video enabled telemedicine application and verified that I am speaking with the correct person using two identifiers.  Location: Patient: Home Provider: Clinic   I discussed the limitations of evaluation and management by telemedicine and the availability of in person appointments. The patient expressed understanding and agreed to proceed.  I provided 30 minutes of non-face-to-face time during this encounter.    10/23/2021 2:33 PM Kristen [name]  MRN:  [id number]  Chief Complaint: "I think I have a cold or covid"  HPI:  36 year old female seen today for follow up psychiatric evaluation.  She has a psychiatric history of social phobia, ADD, anxiety, depression, and PTSD.  She is currently being managed on Seroquel 100 mg, Zoloft 200 mg daily, trazodone 50 mg nightly as needed, BuSpar 15 mg 3 times daily, hydroxyzine 25 mg 3 times a day as needed, gabapentin 300 mg nightly (prescribed by PCP), and propanolol 20 mg 2 times daily (recives from PCP).  Patient informed writer that she is no longer taking Seroquel or BuSpar.  She is now taking Rexulti 1 mg daily.  She notes that her medications are effective in managing her psychiatric conditions.   Today patient's camera was turned off.  During exam she was pleasant, cooperative, and engaged in conversation.  She informed provider that she has a cold or COVID.  Patient was coughing throughout the exam.  She notes that mentally she feels okay but physically she is not doing well.  Provider encouraged patient [facility]  She endorsed understanding and agreed.  Patient endorses fluctuations in sleep and appetite due to current sickness.  Today she denies SI/HI/AVH, mania, paranoia.    No medication changes made today.  She will continue all medications as prescribed.   No other concerns at this time.    Visit Diagnosis:    ICD-10-CM   1. Social phobia  F40.10 hydrOXYzine (ATARAX) 25 MG tablet    traZODone (DESYREL) 100 MG tablet    sertraline (ZOLOFT) 100 MG tablet    2. Severe episode of recurrent major depressive disorder, with psychotic features ([place])  F33.3 traZODone (DESYREL) 100 MG tablet    sertraline (ZOLOFT) 100 MG tablet      Past Psychiatric History: social phobia, ADD, anxiety, depression, and PTSD.  Past Medical History:  Past Medical History:  Diagnosis Date   Anxiety 2013   treated at [facility] by [place]    no hospitalization, or intubation, previously on advair and singulair. asthma worsening.    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 , dx in utero    Depression 2001   depressed since childhood    Dysrhythmia 2010   PVC's   Essential hypertension 03/12/2019   GERD (gastroesophageal reflux disease)    History of hiatal hernia    History of suicidal ideation    IBS (irritable bowel syndrome)    Kyphosis of thoracic region 2004    painful, runs in dad side    Migraine    Pancreatitis 2003   gallstone pancreatitis    Pancreatitis due to biliary obstruction 2003   PCOS (polycystic ovarian syndrome) 2014   facial hair, irregular periods, never been pregnant    Sleep apnea    no longer uses cpap  Past Surgical History:  Procedure Laterality Date   BIOPSY  06/11/2021   Procedure: BIOPSY;  Surgeon: Mauri Pole, MD;  Location: [facility];  Service: Gastroenterology;;   BLADDER SURGERY     CHOLECYSTECTOMY  2003    COLONOSCOPY WITH PROPOFOL N/A 06/11/2021   Procedure: COLONOSCOPY WITH PROPOFOL;  Surgeon: Mauri Pole, MD;  Location: [facility];  Service: Gastroenterology;  Laterality: N/A;   DILATATION & CURETTAGE/HYSTEROSCOPY WITH MYOSURE N/A 12/14/2019   Procedure: DILATATION & CURETTAGE/HYSTEROSCOPY WITH Polypectomy with MYOSURE;  Surgeon: Osborne Oman, MD;  Location: [facility];   Service: Gynecology;  Laterality: N/A;   OPEN REDUCTION INTERNAL FIXATION (ORIF) PROXIMAL PHALANX Right 07/23/2013   Procedure: OPEN TREATMENT RIGHT RING FINGER, PROXIMAL INTERPHALANGEAL/JOINT FRACTURE DISLOCATION;  Surgeon: Jolyn Nap, MD;  Location: [facility];  Service: Orthopedics;  Laterality: Right;   TONSILLECTOMY     and adenoidectomy    Family Psychiatric History: Mother PTSD, Father Bipolar disorder and PTSD  Family History:  Family History  Problem Relation Age of Onset   Hypertension Mother    Arnold-Chiari malformation Mother    Restless legs syndrome Mother    Osteoporosis Mother    COPD Mother    Asthma Mother    Squamous cell carcinoma Mother        skin    Eczema Mother    Arthritis Mother    Peripheral Artery Disease Mother    Anxiety disorder Mother    OCD Mother    Colonic polyp Mother    Hypertension Father    Scoliosis Father    Bipolar disorder Father    Congestive Heart Failure Father    COPD Father    Depression Father    Diabetes Maternal Grandmother    Hypertension Maternal Grandmother    Dementia Maternal Grandmother    OCD Maternal Grandmother    Bone cancer Maternal Grandfather 82       bone marrow    Multiple myeloma Maternal Grandfather    Alcohol abuse Maternal Grandfather    Drug abuse Maternal Grandfather    Cancer Paternal Grandmother        colon   Diabetes Paternal Grandfather    Alcohol abuse Paternal Grandfather    Drug abuse Paternal Grandfather    Dementia Paternal Grandfather    Alcohol abuse Maternal Aunt    Depression Maternal Aunt    Pancreatic cancer Maternal Aunt    Alcohol abuse Paternal Aunt    Depression Paternal Aunt    ADD / ADHD Cousin     Social History:  Social History   Socioeconomic History   Marital status: Single    Spouse name: Not on file   Number of children: 0   Years of education: college    Highest education level: Not on file  Occupational History   Occupation:  Unemployed   Tobacco Use   Smoking status: Never   Smokeless tobacco: Never  Vaping Use   Vaping Use: Never used  Substance and Sexual Activity   Alcohol use: No   Drug use: No   Sexual activity: Yes    Birth control/protection: None  Other Topics Concern   Not on file  Social History Narrative   Lives with mom [name])    Right-handed   Caffeine: 3 cups of coffee per day   Social Determinants of Health   Financial Resource Strain: Not on file  Food Insecurity: Food Insecurity Present (08/17/2020)   Hunger Vital Sign  Worried About Charity fundraiser in the Last Year: Often true    Pettibone in the Last Year: Sometimes true  Transportation Needs: No Transportation Needs (08/17/2020)   PRAPARE - Hydrologist (Medical): No    Lack of Transportation (Non-Medical): No  Physical Activity: Not on file  Stress: Not on file  Social Connections: Not on file    Allergies:  Allergies  Allergen Reactions   Mucinex [Guaifenesin Er] Hives, Itching and Swelling   Penicillins Swelling    Did it involve swelling of the face/tongue/throat, SOB, or low BP? Yes Did it involve sudden or severe rash/hives, skin peeling, or any reaction on the inside of your mouth or nose? No Did you need to seek medical attention at a hospital or doctor's office? Yes When did it last happen?      >10 years ago If all above answers are "NO", may proceed with cephalosporin use.    Contrast Media [Iodinated Contrast Media] Nausea And Vomiting   Latex Hives and Swelling   Multihance [Gadobenate] Nausea And Vomiting   Other     Pickles - throat swelling and nausea     Metabolic Disorder Labs: Lab Results  Component Value Date   HGBA1C 4.9 04/15/2019   MPG 93.93 04/15/2019   Lab Results  Component Value Date   PROLACTIN 15.3 11/01/2020   PROLACTIN 20.5 07/14/2020   Lab Results  Component Value Date   CHOL 204 (H) 04/15/2019   TRIG 146 04/15/2019   HDL 41  04/15/2019   CHOLHDL 5.0 04/15/2019   VLDL 29 04/15/2019   LDLCALC 134 (H) 04/15/2019   Lab Results  Component Value Date   TSH 2.99 11/01/2020   TSH 4.628 (H) 04/15/2019    Therapeutic Level Labs: No results found for: "LITHIUM" No results found for: "VALPROATE" No results found for: "CBMZ"  Current Medications: Current Outpatient Medications  Medication Sig Dispense Refill   albuterol (VENTOLIN HFA) 108 (90 Base) MCG/ACT inhaler Inhale 2 puffs into the lungs every 6 (six) hours as needed for wheezing or shortness of breath. 6.7 g 3   Benzocaine-Resorcinol (VAGISIL EX) Apply 1 application. topically as needed.     brexpiprazole (REXULTI) 1 MG TABS tablet Take 1 tablet (1 mg total) by mouth daily. 30 tablet 3   cholestyramine (QUESTRAN) 4 g packet Take 1 packet twice daily between meals 60 each 6   cyclobenzaprine (FLEXERIL) 5 MG tablet Take 1 tablet (5 mg total) by mouth 2 (two) times daily as needed for muscle spasms. 30 tablet 0   diclofenac Sodium (VOLTAREN) 1 % GEL Apply 2 g topically 4 (four) times daily. (Patient taking differently: Apply 2 g topically as needed.) 100 g 0   dicyclomine (BENTYL) 10 MG capsule Take 1 capsule by mouth 1 hour before meals as needed for diarrhea 90 capsule 2   gabapentin (NEURONTIN) 300 MG capsule TAKE 1 CAPSULE (300 MG TOTAL) BY MOUTH AT BEDTIME. (Patient taking differently: Take 300 mg by mouth as needed (pain).) 30 capsule 3   hydrOXYzine (ATARAX) 25 MG tablet TAKE 1 TABLET (25 MG TOTAL) BY MOUTH 3 (THREE) TIMES DAILY AS NEEDED FOR ANXIETY. 90 tablet 3   ipratropium-albuterol (DUONEB) 0.5-2.5 (3) MG/3ML SOLN Take 3 mLs by nebulization every 4 (four) hours as needed.     lidocaine (LIDODERM) 5 % Place 1 patch onto the skin daily. Remove & Discard patch within 12 hours or as directed by MD 30 patch  0   loperamide (IMODIUM A-D) 2 MG tablet Take 2 mg by mouth 4 (four) times daily as needed for diarrhea or loose stools.     LORazepam (ATIVAN) 1 MG  tablet Take 1 tablet (1 mg total) by mouth every 8 (eight) hours as needed for anxiety. 5 tablet 0   megestrol (MEGACE) 40 MG tablet Take 2 tablets (80 mg total) by mouth daily. Can increase to two tablets twice a day in the event of heavy bleeding (Patient taking differently: Take 80 mg by mouth as needed. Can increase to two tablets twice a day in the event of heavy bleeding) 60 tablet 5   naproxen (NAPROSYN) 375 MG tablet Take 1 tablet (375 mg total) by mouth 2 (two) times daily with a meal. 20 tablet 0   omeprazole (PRILOSEC) 20 MG capsule Take 1 capsule (20 mg total) by mouth daily. 30 capsule 3   Phenazopyridine HCl (AZO URINARY PAIN RELIEF) 99.5 MG TABS Take 2 tablets by mouth as needed.     propranolol (INDERAL) 20 MG tablet Take 1 tablet (20 mg total) by mouth 2 (two) times daily. For anxiety 60 tablet 2   sertraline (ZOLOFT) 100 MG tablet Take 2 tablets (200 mg total) by mouth at bedtime. 60 tablet 3   traZODone (DESYREL) 100 MG tablet Take 1 tablet (100 mg total) by mouth at bedtime as needed for sleep. 30 tablet 3   No current facility-administered medications for this visit.     Musculoskeletal: Strength & Muscle Tone:  Unable to assess due to telehealth visit, camera turned off Gait & Station:  Unable to assess due to telehealth visit, camera turned off Patient leans: N/A  Psychiatric Specialty Exam: Review of Systems  There were no vitals taken for this visit.There is no height or weight on file to calculate BMI.  General Appearance:  Unable to assess due to telehealth visit, camera turned off  Eye Contact:   Unable to assess due to telehealth visit, camera turned off  Speech:  Clear and Coherent and Normal Rate  Volume:  Normal  Mood:  Euthymic  Affect:  Congruent  Thought Process:  Coherent, Goal Directed and Linear  Orientation:  Full (Time, Place, and Person)  Thought Content: WDL and Logical   Suicidal Thoughts:  No  Homicidal Thoughts:  No  Memory:  Immediate;    Good Recent;   Good Remote;   Good  Judgement:  Good  Insight:  Good  Psychomotor Activity:   Unable to assess due to telehealth visit, camera turned off  Concentration:  Concentration: Good and Attention Span: Good  Recall:  Good  Fund of Knowledge: Good  Language: Good  Akathisia:   Unable to assess due to telehealth visit, camera turned off  Handed:  Right  AIMS (if indicated): Not done  Assets:  Communication Skills Desire for Improvement Financial Resources/Insurance Housing Social Support  ADL's:  Intact  Cognition: WNL  Sleep:  Fair   Screenings: AIMS    Flowsheet Row Admission (Discharged) from OP Visit from 04/14/2019 in [facility]  AIMS Total Score 0      AUDIT    Flowsheet Row Admission (Discharged) from OP Visit from 04/14/2019 in [facility]  Alcohol Use Disorder Identification Test Final Score (AUDIT) 0      GAD-7    Flowsheet Row Video Visit from 07/23/2021 in [facility] Video Visit from 02/16/2021 in [facility] Video Visit from 11/14/2020 in [facility] Office Visit from 08/17/2020 in Center for [place] [facility] Video Visit from 08/14/2020 in [facility]  Total GAD-7 Score 16 16 19 18 15       PHQ2-9    Flowsheet Row Video Visit from 07/23/2021 in [facility] Video Visit from 02/16/2021 in [facility] Counselor from 12/13/2020 in [facility] Video Visit from 11/14/2020 in [facility] Office Visit from 08/17/2020 in Center for [place] [facility]  PHQ-2 Total Score 4 6 6 6 6   PHQ-9 Total Score 17 20 20 21 22       Flowsheet Row Video Visit from 07/23/2021 in [facility] ED from 06/14/2021 in [facility] Admission (Discharged) from 06/11/2021 in [facility] [place] Error: Q7 should not be populated when Q6 is No No Risk No Risk        Assessment and Plan: Patient reports that mentally she feels stable however notes that she physically feels sick.  At this time no medication changes made.  Patient agreeable to continue medication as prescribed.  1. Severe episode of recurrent major depressive disorder, with psychotic features ([place])  Continue- brexpiprazole (REXULTI) 1 MG TABS tablet; Take 1 tablet (1 mg total) by mouth daily.  Dispense: 30 tablet; Refill: 3 Continue- traZODone (DESYREL) 100 MG tablet; Take 1 tablet (100 mg total) by mouth at bedtime as needed for sleep.  Dispense: 30 tablet; Refill: 3 Continue- sertraline (ZOLOFT) 100 MG tablet; Take 2 tablets (200 mg total) by mouth at bedtime.  Dispense: 60 tablet; Refill: 3 2. Social phobia  Continue- traZODone (DESYREL) 100 MG tablet; Take 1 tablet (100 mg total) by mouth at bedtime as needed for sleep.  Dispense: 30 tablet; Refill: 3 Continue- sertraline (ZOLOFT) 100 MG tablet; Take 2 tablets (200 mg total) by mouth at bedtime.  Dispense: 60 tablet; Refill: 3 Continue- hydrOXYzine (ATARAX) 25 MG tablet; TAKE 1 TABLET (25 MG TOTAL) BY MOUTH 3 (THREE) TIMES DAILY AS NEEDED FOR ANXIETY.  Dispense: 90 tablet; Refill: 3    Follow-up in 3 months Follow-up therapy Salley Slaughter, NP 10/23/2021, 2:33 PM

## 2021-10-30 ENCOUNTER — Other Ambulatory Visit: Payer: Self-pay

## 2022-01-24 ENCOUNTER — Telehealth (HOSPITAL_COMMUNITY): Payer: No Payment, Other | Admitting: Psychiatry

## 2022-02-20 ENCOUNTER — Other Ambulatory Visit: Payer: Self-pay

## 2022-03-22 ENCOUNTER — Other Ambulatory Visit: Payer: Self-pay

## 2022-03-28 ENCOUNTER — Encounter: Payer: Self-pay | Admitting: Internal Medicine

## 2022-03-29 ENCOUNTER — Telehealth (HOSPITAL_COMMUNITY): Payer: Self-pay

## 2022-03-29 NOTE — Telephone Encounter (Signed)
Medication management - Telephone call with patient, after she left a message requesting Dr. Ronne Binning write her a letter to DSS for her application for foodstamps to inform them at present she is not able to hold down employment. Patient stated she has applied for disability as well but needs a letter from Dr. Ronne Binning about her current mental status keeping her from working. Patient stated asking her PCP but they felt this should come from her mental health provider since disability based on those symptoms.  Reminded patient she does have an appointment with Dr. Ronne Binning set for 04/03/22 but would send request to see if this would be something she might would assist with completing. Informed patient, provider may want to see her on the 28th again before making that decision and patient stated understanding.

## 2022-04-01 NOTE — Telephone Encounter (Signed)
Thank you for this update.  Provider will discuss writing a letter on 03/24/2022 with she speaks with patient.

## 2022-04-03 ENCOUNTER — Telehealth (INDEPENDENT_AMBULATORY_CARE_PROVIDER_SITE_OTHER): Payer: Medicaid Other | Admitting: Psychiatry

## 2022-04-03 ENCOUNTER — Encounter (HOSPITAL_COMMUNITY): Payer: Self-pay | Admitting: Psychiatry

## 2022-04-03 ENCOUNTER — Other Ambulatory Visit: Payer: Self-pay

## 2022-04-03 DIAGNOSIS — F333 Major depressive disorder, recurrent, severe with psychotic symptoms: Secondary | ICD-10-CM | POA: Diagnosis not present

## 2022-04-03 DIAGNOSIS — F4010 Social phobia, unspecified: Secondary | ICD-10-CM

## 2022-04-03 MED ORDER — TRAZODONE HCL 100 MG PO TABS
100.0000 mg | ORAL_TABLET | Freq: Every evening | ORAL | 3 refills | Status: DC | PRN
  Filled 2022-04-03: qty 30, 30d supply, fill #0

## 2022-04-03 MED ORDER — HYDROXYZINE HCL 25 MG PO TABS
ORAL_TABLET | Freq: Three times a day (TID) | ORAL | 3 refills | Status: DC | PRN
  Filled 2022-04-03: qty 90, 30d supply, fill #0

## 2022-04-03 MED ORDER — QUETIAPINE FUMARATE 50 MG PO TABS
50.0000 mg | ORAL_TABLET | Freq: Every day | ORAL | 3 refills | Status: DC
  Filled 2022-04-03: qty 30, 30d supply, fill #0

## 2022-04-03 MED ORDER — SERTRALINE HCL 100 MG PO TABS
200.0000 mg | ORAL_TABLET | Freq: Every day | ORAL | 3 refills | Status: DC
  Filled 2022-04-03: qty 60, 30d supply, fill #0

## 2022-04-03 MED ORDER — BUSPIRONE HCL 10 MG PO TABS
10.0000 mg | ORAL_TABLET | Freq: Three times a day (TID) | ORAL | 3 refills | Status: DC
  Filled 2022-04-03: qty 90, 30d supply, fill #0

## 2022-04-03 NOTE — Progress Notes (Signed)
Kristen [name]/PA/NP OP Progress Note Virtual Visit via Video Note  I connected with Kristen [name] on 04/03/22 at 10:30 AM EST by a video enabled telemedicine application and verified that I am speaking with the correct person using two identifiers.  Location: Patient: Home Provider: Clinic   I discussed the limitations of evaluation and management by telemedicine and the availability of in person appointments. The patient expressed understanding and agreed to proceed.  I provided 30 minutes of non-face-to-face time during this encounter.    04/03/2022 10:57 AM Kristen [name]  MRN:  [id number]  Chief Complaint: "I have been depressed"  HPI:  37 year old female seen today for follow up psychiatric evaluation.  She has a psychiatric history of social phobia, ADD, anxiety, depression, and PTSD.  She is currently being managed on Zoloft 200 mg daily, trazodone 50 mg nightly as needed,  Rexulti 1 mg daily, hydroxyzine 25 mg 3 times a day as needed, gabapentin 300 mg nightly (prescribed by PCP), and propanolol 20 mg 2 times daily (recives from PCP).  She notes that she discontinued Rexulti due dizziness and GI upset.  She notes that her medications are somewhat effective in managing her psychiatric conditions.   Today patient's camera was turned off.  During exam she was pleasant, cooperative, and engaged in conversation.  She informed provider that she has been depressed.  She notes that she discontinued Rexulti due to side effects and would like to restart Seroquel and BuSpar.  Patient notes that she has been engaging in negative self talk.  She notes that she stays up and tells herself that she not smart or that she is not worth it.  She notes that she thinks about things that she did as a child that were embarrassing.  Patient also informed writer that at times she has insomnia and later on she has hypersomnia.  She notes that she sleeps between 5 and 18 hours.  Patient also reports that  her appetite has been poor.  She denies recent weight gain/loss.  She informed Probation officer that the main source of her anxiety is regarding her upcoming court case, her health, and already applying for her food stamps.  Today provider conducted a GAD-7 and patient scored an 18.  Provider also conducted PHQ-9 and patient scored a 24.  Today she endorses passive SI but denies wanting to harm herself.  She also informed writer that at times she has visual hallucinations noting that she sees things crawling.  Today patient agreeable to restarting Seroquel 50 mg to help manage sleep and symptoms of psychosis.  She is also agreeable to restarting BuSpar 10 mg 3 times daily to help manage symptoms of anxiety and depression.  Provider discussed patient writing words of affirmation around her house and repeating it during negative self talk.  She endorsed understanding and agreed.  Potential side effects of medication and risks vs benefits of treatment vs non-treatment were explained and discussed. All questions were answered.She will continue all medications as prescribed.  No other concerns at this time.    Visit Diagnosis:    ICD-10-CM   1. Social phobia  F40.10 busPIRone (BUSPAR) 10 MG tablet    traZODone (DESYREL) 100 MG tablet    sertraline (ZOLOFT) 100 MG tablet    hydrOXYzine (ATARAX) 25 MG tablet    2. Severe episode of recurrent major depressive disorder, with psychotic features ([place])  F33.3 busPIRone (BUSPAR) 10 MG tablet    QUEtiapine (SEROQUEL) 50 MG tablet  traZODone (DESYREL) 100 MG tablet    sertraline (ZOLOFT) 100 MG tablet      Past Psychiatric History: social phobia, ADD, anxiety, depression, and PTSD.  Past Medical History:  Past Medical History:  Diagnosis Date   Anxiety 2013   treated at [facility] by [place]    no hospitalization, or intubation, previously on advair and singulair. asthma worsening.    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 , dx in utero     Depression 2001   depressed since childhood    Dysrhythmia 2010   PVC's   Essential hypertension 03/12/2019   GERD (gastroesophageal reflux disease)    History of hiatal hernia    History of suicidal ideation    IBS (irritable bowel syndrome)    Kyphosis of thoracic region 2004    painful, runs in dad side    Migraine    Pancreatitis 2003   gallstone pancreatitis    Pancreatitis due to biliary obstruction 2003   PCOS (polycystic ovarian syndrome) 2014   facial hair, irregular periods, never been pregnant    Sleep apnea    no longer uses cpap    Past Surgical History:  Procedure Laterality Date   BIOPSY  06/11/2021   Procedure: BIOPSY;  Surgeon: Kristen [name];  Location: [facility];  Service: Gastroenterology;;   BLADDER SURGERY     CHOLECYSTECTOMY  2003    COLONOSCOPY WITH PROPOFOL N/A 06/11/2021   Procedure: COLONOSCOPY WITH PROPOFOL;  Surgeon: Kristen [name];  Location: [facility];  Service: Gastroenterology;  Laterality: N/A;   DILATATION & CURETTAGE/HYSTEROSCOPY WITH MYOSURE N/A 12/14/2019   Procedure: DILATATION & CURETTAGE/HYSTEROSCOPY WITH Polypectomy with MYOSURE;  Surgeon: Kristen [name];  Location: [facility];  Service: Gynecology;  Laterality: N/A;   OPEN REDUCTION INTERNAL FIXATION (ORIF) PROXIMAL PHALANX Right 07/23/2013   Procedure: OPEN TREATMENT RIGHT RING FINGER, PROXIMAL INTERPHALANGEAL/JOINT FRACTURE DISLOCATION;  Surgeon: Kristen [name];  Location: [place];  Service: Orthopedics;  Laterality: Right;   TONSILLECTOMY     and adenoidectomy    Family Psychiatric History: Mother PTSD, Father Bipolar disorder and PTSD  Family History:  Family History  Problem Relation Age of Onset   Hypertension Mother    Arnold-Chiari malformation Mother    Restless legs syndrome Mother    Osteoporosis Mother    COPD Mother    Asthma Mother    Squamous cell carcinoma Mother        skin    Eczema Mother    Arthritis Mother     Peripheral Artery Disease Mother    Anxiety disorder Mother    OCD Mother    Colonic polyp Mother    Hypertension Father    Scoliosis Father    Bipolar disorder Father    Congestive Heart Failure Father    COPD Father    Depression Father    Diabetes Maternal Grandmother    Hypertension Maternal Grandmother    Dementia Maternal Grandmother    OCD Maternal Grandmother    Bone cancer Maternal Grandfather 82       bone marrow    Multiple myeloma Maternal Grandfather    Alcohol abuse Maternal Grandfather    Drug abuse Maternal Grandfather    Cancer Paternal Grandmother        colon   Diabetes Paternal Grandfather    Alcohol abuse Paternal Grandfather    Drug abuse Paternal Grandfather    Dementia Paternal  Grandfather    Alcohol abuse Maternal Aunt    Depression Maternal Aunt    Pancreatic cancer Maternal Aunt    Alcohol abuse Paternal Aunt    Depression Paternal Aunt    ADD / ADHD Cousin     Social History:  Social History   Socioeconomic History   Marital status: Single    Spouse name: Not on file   Number of children: 0   Years of education: college    Highest education level: Not on file  Occupational History   Occupation: Unemployed   Tobacco Use   Smoking status: Never   Smokeless tobacco: Never  Vaping Use   Vaping Use: Never used  Substance and Sexual Activity   Alcohol use: No   Drug use: No   Sexual activity: Yes    Birth control/protection: None  Other Topics Concern   Not on file  Social History Narrative   Lives with mom [name])    Right-handed   Caffeine: 3 cups of coffee per day   Social Determinants of Health   Financial Resource Strain: Not on file  Food Insecurity: Food Insecurity Present (08/17/2020)   Hunger Vital Sign    Worried About Running Out of Food in the Last Year: Often true    Ran Out of Food in the Last Year: Sometimes true  Transportation Needs: No Transportation Needs (08/17/2020)   PRAPARE - Radiographer, therapeutic (Medical): No    Lack of Transportation (Non-Medical): No  Physical Activity: Not on file  Stress: Not on file  Social Connections: Not on file    Allergies:  Allergies  Allergen Reactions   Mucinex [Guaifenesin Er] Hives, Itching and Swelling   Penicillins Swelling    Did it involve swelling of the face/tongue/throat, SOB, or low BP? Yes Did it involve sudden or severe rash/hives, skin peeling, or any reaction on the inside of your mouth or nose? No Did you need to seek medical attention at a hospital or doctor's office? Yes When did it last happen?      >10 years ago If all above answers are "NO", may proceed with cephalosporin use.    Contrast Media [Iodinated Contrast Media] Nausea And Vomiting   Latex Hives and Swelling   Multihance [Gadobenate] Nausea And Vomiting   Other     Pickles - throat swelling and nausea     Metabolic Disorder Labs: Lab Results  Component Value Date   HGBA1C 4.9 04/15/2019   MPG 93.93 04/15/2019   Lab Results  Component Value Date   PROLACTIN 15.3 11/01/2020   PROLACTIN 20.5 07/14/2020   Lab Results  Component Value Date   CHOL 204 (H) 04/15/2019   TRIG 146 04/15/2019   HDL 41 04/15/2019   CHOLHDL 5.0 04/15/2019   VLDL 29 04/15/2019   LDLCALC 134 (H) 04/15/2019   Lab Results  Component Value Date   TSH 2.99 11/01/2020   TSH 4.628 (H) 04/15/2019    Therapeutic Level Labs: No results found for: "LITHIUM" No results found for: "VALPROATE" No results found for: "CBMZ"  Current Medications: Current Outpatient Medications  Medication Sig Dispense Refill   busPIRone (BUSPAR) 10 MG tablet Take 1 tablet (10 mg total) by mouth 3 (three) times daily. 90 tablet 3   QUEtiapine (SEROQUEL) 50 MG tablet Take 1 tablet (50 mg total) by mouth at bedtime. 30 tablet 3   albuterol (VENTOLIN HFA) 108 (90 Base) MCG/ACT inhaler Inhale 2 puffs into the lungs every  6 (six) hours as needed for wheezing or shortness of breath. 6.7 g 3    Benzocaine-Resorcinol (VAGISIL EX) Apply 1 application. topically as needed.     cholestyramine (QUESTRAN) 4 g packet Take 1 packet twice daily between meals 60 each 6   diclofenac Sodium (VOLTAREN) 1 % GEL Apply 2 g topically 4 (four) times daily. (Patient taking differently: Apply 2 g topically as needed.) 100 g 0   gabapentin (NEURONTIN) 300 MG capsule TAKE 1 CAPSULE (300 MG TOTAL) BY MOUTH AT BEDTIME. (Patient taking differently: Take 300 mg by mouth as needed (pain).) 30 capsule 3   hydrOXYzine (ATARAX) 25 MG tablet TAKE 1 TABLET (25 MG TOTAL) BY MOUTH 3 (THREE) TIMES DAILY AS NEEDED FOR ANXIETY. 90 tablet 3   ipratropium-albuterol (DUONEB) 0.5-2.5 (3) MG/3ML SOLN Take 3 mLs by nebulization every 4 (four) hours as needed.     loperamide (IMODIUM A-D) 2 MG tablet Take 2 mg by mouth 4 (four) times daily as needed for diarrhea or loose stools.     naproxen (NAPROSYN) 375 MG tablet Take 1 tablet (375 mg total) by mouth 2 (two) times daily with a meal. 20 tablet 0   omeprazole (PRILOSEC) 20 MG capsule Take 1 capsule (20 mg total) by mouth daily. 30 capsule 3   propranolol (INDERAL) 20 MG tablet Take 1 tablet (20 mg total) by mouth 2 (two) times daily. For anxiety 60 tablet 2   sertraline (ZOLOFT) 100 MG tablet Take 2 tablets (200 mg total) by mouth at bedtime. 60 tablet 3   traZODone (DESYREL) 100 MG tablet Take 1 tablet (100 mg total) by mouth at bedtime as needed for sleep. 30 tablet 3   No current facility-administered medications for this visit.     Musculoskeletal: Strength & Muscle Tone:  Unable to assess due to telehealth visit, camera turned off Gait & Station:  Unable to assess due to telehealth visit, camera turned off Patient leans: N/A  Psychiatric Specialty Exam: Review of Systems  There were no vitals taken for this visit.There is no height or weight on file to calculate BMI.  General Appearance:  Unable to assess due to telehealth visit, camera turned off  Eye Contact:    Unable to assess due to telehealth visit, camera turned off  Speech:  Clear and Coherent and Normal Rate  Volume:  Normal  Mood:  Anxious and Depressed  Affect:  Congruent  Thought Process:  Coherent, Goal Directed and Linear  Orientation:  Full (Time, Place, and Person)  Thought Content: Logical and Hallucinations: Visual   Suicidal Thoughts:  Yes.  without intent/plan  Homicidal Thoughts:  No  Memory:  Immediate;   Good Recent;   Good Remote;   Good  Judgement:  Good  Insight:  Good  Psychomotor Activity:   Unable to assess due to telehealth visit, camera turned off  Concentration:  Concentration: Good and Attention Span: Good  Recall:  Good  Fund of Knowledge: Good  Language: Good  Akathisia:   Unable to assess due to telehealth visit, camera turned off  Handed:  Right  AIMS (if indicated): Not done  Assets:  Communication Skills Desire for Improvement Financial Resources/Insurance Housing Social Support  ADL's:  Intact  Cognition: WNL  Sleep:  Fair   Screenings: AIMS    Flowsheet Row Admission (Discharged) from OP Visit from 04/14/2019 in [facility]  AIMS Total Score 0      AUDIT    Flowsheet Row Admission (Discharged)  from OP Visit from 04/14/2019 in [facility]  Alcohol Use Disorder Identification Test Final Score (AUDIT) 0      GAD-7    Flowsheet Row Video Visit from 04/03/2022 in [facility] Video Visit from 07/23/2021 in [facility] Video Visit from 02/16/2021 in [facility] Video Visit from 11/14/2020 in [facility] Office Visit from 08/17/2020 in Center for [place] [facility]  Total GAD-7 Score '18 16 16 19 18      '$ PHQ2-9    Flowsheet Row Video Visit from 04/03/2022 in [facility] Video Visit from 07/23/2021 in  [facility] Video Visit from 02/16/2021 in [facility] Counselor from 12/13/2020 in [facility] Video Visit from 11/14/2020 in [place]  PHQ-2 Total Score '6 4 6 6 6  '$ PHQ-9 Total Score '24 17 20 20 21      '$ Flowsheet Row Video Visit from 04/03/2022 in [facility] Video Visit from 07/23/2021 in [facility] ED from 06/14/2021 in [facility] Emergency Department at [place] Error: Q7 should not be populated when Q6 is No Error: Q7 should not be populated when Q6 is No No Risk        Assessment and Plan: Patient endorses anxiety, depression, visual hallucinations, and fluctuations in sleep.Today patient agreeable to restarting Seroquel 50 mg to help manage sleep and symptoms of psychosis.  She is also agreeable to restarting BuSpar 10 mg 3 times daily to help manage symptoms of anxiety and depression.  At this time Rexulti not restarted.  She will continue all medications as prescribed.  Provider discussed patient writing words of affirmation during negative talk and repeating them to herself.  She endorsed understanding and agreed.  1. Social phobia  Restart- busPIRone (BUSPAR) 10 MG tablet; Take 1 tablet (10 mg total) by mouth 3 (three) times daily.  Dispense: 90 tablet; Refill: 3 Continue- traZODone (DESYREL) 100 MG tablet; Take 1 tablet (100 mg total) by mouth at bedtime as needed for sleep.  Dispense: 30 tablet; Refill: 3 Continue- sertraline (ZOLOFT) 100 MG tablet; Take 2 tablets (200 mg total) by mouth at bedtime.  Dispense: 60 tablet; Refill: 3 Continue- hydrOXYzine (ATARAX) 25 MG tablet; TAKE 1 TABLET (25 MG TOTAL) BY MOUTH 3 (THREE) TIMES DAILY AS NEEDED FOR ANXIETY.  Dispense: 90 tablet; Refill: 3  2. Severe episode of recurrent major depressive disorder, with psychotic features  ([place])  Restart- busPIRone (BUSPAR) 10 MG tablet; Take 1 tablet (10 mg total) by mouth 3 (three) times daily.  Dispense: 90 tablet; Refill: 3 Restart- QUEtiapine (SEROQUEL) 50 MG tablet; Take 1 tablet (50 mg total) by mouth at bedtime.  Dispense: 30 tablet; Refill: 3 Continue- traZODone (DESYREL) 100 MG tablet; Take 1 tablet (100 mg total) by mouth at bedtime as needed for sleep.  Dispense: 30 tablet; Refill: 3 Continue- sertraline (ZOLOFT) 100 MG tablet; Take 2 tablets (200 mg total) by mouth at bedtime.  Dispense: 60 tablet; Refill: 3  Follow-up in 3 months Follow-up therapy Salley Slaughter, NP 04/03/2022, 10:57 AM

## 2022-04-04 ENCOUNTER — Other Ambulatory Visit: Payer: Self-pay

## 2022-04-04 ENCOUNTER — Encounter (HOSPITAL_COMMUNITY): Payer: Self-pay | Admitting: Psychiatry

## 2022-06-11 ENCOUNTER — Telehealth: Payer: Medicaid Other | Admitting: Physician Assistant

## 2022-06-11 ENCOUNTER — Ambulatory Visit: Payer: Self-pay

## 2022-06-11 ENCOUNTER — Other Ambulatory Visit: Payer: Self-pay

## 2022-06-11 DIAGNOSIS — J208 Acute bronchitis due to other specified organisms: Secondary | ICD-10-CM | POA: Diagnosis not present

## 2022-06-11 DIAGNOSIS — J4521 Mild intermittent asthma with (acute) exacerbation: Secondary | ICD-10-CM | POA: Diagnosis not present

## 2022-06-11 MED ORDER — PREDNISONE 20 MG PO TABS
40.0000 mg | ORAL_TABLET | Freq: Every day | ORAL | 0 refills | Status: DC
  Filled 2022-06-11: qty 10, 5d supply, fill #0

## 2022-06-11 MED ORDER — FLUTICASONE PROPIONATE 50 MCG/ACT NA SUSP
2.0000 | Freq: Every day | NASAL | 0 refills | Status: DC
  Filled 2022-06-11: qty 16, 30d supply, fill #0

## 2022-06-11 MED ORDER — BENZONATATE 100 MG PO CAPS
100.0000 mg | ORAL_CAPSULE | Freq: Three times a day (TID) | ORAL | 0 refills | Status: DC | PRN
Start: 2022-06-11 — End: 2022-07-08
  Filled 2022-06-11: qty 30, 10d supply, fill #0

## 2022-06-11 MED ORDER — PROMETHAZINE-DM 6.25-15 MG/5ML PO SYRP
5.0000 mL | ORAL_SOLUTION | Freq: Four times a day (QID) | ORAL | 0 refills | Status: DC | PRN
  Filled 2022-06-11: qty 118, 6d supply, fill #0

## 2022-06-11 NOTE — Telephone Encounter (Signed)
t called and reports that she has potential respiratory infection symptoms. Please advise   Chief Complaint: Cough, congestion, sore throat Symptoms: Above Frequency: Last night Pertinent Negatives: Patient denies fever Disposition: [] ED /[] Urgent Care (no appt availability in office) / [] Appointment(In office/virtual)/ [x]  [facility]/ [] Home Care/ [] Refused Recommended Disposition /[] [facility]/ []  Follow-up with PCP Additional Notes:   Reason for Disposition  [1] MILD difficulty breathing (e.g., minimal/no SOB at rest, SOB with walking, pulse <100) AND [2] still present when not coughing  Answer Assessment - Initial Assessment Questions 1. ONSET: "When did the cough begin?"      Last night 2. SEVERITY: "How bad is the cough today?"      Moderate 3. SPUTUM: "Describe the color of your sputum" (none, dry cough; clear, white, yellow, green)     Clear- yellow 4. HEMOPTYSIS: "Are you coughing up any blood?" If so ask: "How much?" (flecks, streaks, tablespoons, etc.)     No 5. DIFFICULTY BREATHING: "Are you having difficulty breathing?" If Yes, ask: "How bad is it?" (e.g., mild, moderate, severe)    - MILD: No SOB at rest, mild SOB with walking, speaks normally in sentences, can lie down, no retractions, pulse < 100.    - MODERATE: SOB at rest, SOB with minimal exertion and prefers to sit, cannot lie down flat, speaks in phrases, mild retractions, audible wheezing, pulse 100-120.    - SEVERE: Very SOB at rest, speaks in single words, struggling to breathe, sitting hunched forward, retractions, pulse > 120      Mild 6. FEVER: "Do you have a fever?" If Yes, ask: "What is your temperature, how was it measured, and when did it start?"     No 7. CARDIAC HISTORY: "Do you have any history of heart disease?" (e.g., heart attack, congestive heart failure)      No 8. LUNG HISTORY: "Do you have any history of lung disease?"  (e.g., pulmonary embolus, asthma, emphysema)      Asthma 9. PE RISK FACTORS: "Do you have a history of blood clots?" (or: recent major surgery, recent prolonged travel, bedridden)     No 10. OTHER SYMPTOMS: "Do you have any other symptoms?" (e.g., runny nose, wheezing, chest pain)       Runny nose, sore throat 11. PREGNANCY: "Is there any chance you are pregnant?" "When was your last menstrual period?"       No 12. TRAVEL: "Have you traveled out of the country in the last month?" (e.g., travel history, exposures)       No  Protocols used: Cough - Acute Productive-A-AH

## 2022-06-11 NOTE — Progress Notes (Signed)
Virtual Visit Consent   Kristen [name], you are scheduled for a virtual visit with a [facility] provider today. Just as with appointments in the office, your consent must be obtained to participate. Your consent will be active for this visit and any virtual visit you may have with one of our providers in the next 365 days. If you have a MyChart account, a copy of this consent can be sent to you electronically.  As this is a virtual visit, video technology does not allow for your provider to perform a traditional examination. This may limit your provider's ability to fully assess your condition. If your provider identifies any concerns that need to be evaluated in person or the need to arrange testing (such as labs, EKG, etc.), we will make arrangements to do so. Although advances in technology are sophisticated, we cannot ensure that it will always work on either your end or our end. If the connection with a video visit is poor, the visit may have to be switched to a telephone visit. With either a video or telephone visit, we are not always able to ensure that we have a secure connection.  By engaging in this virtual visit, you consent to the provision of healthcare and authorize for your insurance to be billed (if applicable) for the services provided during this visit. Depending on your insurance coverage, you may receive a charge related to this service.  I need to obtain your verbal consent now. Are you willing to proceed with your visit today? Kristen [name] has provided verbal consent on 06/11/2022 for a virtual visit (video or telephone). Piedad Climes, [place]  Date: 06/11/2022 5:07 PM  Virtual Visit via Video Note   I, Piedad Climes, connected with  Kristen [name]  ([id number], 1985-08-25) on 06/11/22 at  5:00 PM EDT by a video-enabled telemedicine application and verified that I am speaking with the correct person using two identifiers.  Location: Patient:  Virtual Visit Location Patient: Home Provider: Virtual Visit Location Provider: Office/Clinic   I discussed the limitations of evaluation and management by telemedicine and the availability of in person appointments. The patient expressed understanding and agreed to proceed.    History of Present Illness: Kristen [name] is a 37 y.o. who identifies as a female who was assigned female at birth, and is being seen today for URI. Patient endorses symptoms starting yesterday with bilateral ear pressure and pain with development of sore throat, nasal and head congestion with drainage and a cough. Denies fever, chills, aches, diarrhea. Notes this is flaring her asthma.  Is having to use her albuterol inhaler but has not today. One other person at work sick with similar symptoms.  HPI: HPI  Problems:  Patient Active Problem List   Diagnosis Date Noted   Diarrhea    Chronic diarrhea    Weight gain 11/01/2020   Hirsutism 11/01/2020   Gastroesophageal reflux disease without esophagitis 07/11/2020   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   POTS (postural orthostatic tachycardia syndrome) 06/09/2020   Morbid obesity (HCC) 12/14/2019   Endometrial polyp    Moderate episode of recurrent major depressive disorder (HCC) 11/18/2019   Encounter for autism screening 11/18/2019   Abnormal uterine bleeding (AUB) 11/08/2019   Severe episode of recurrent major depressive disorder, 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   PTSD (post-traumatic stress disorder) 09/20/2019   Irritable bowel syndrome with diarrhea 04/23/2019   Abnormal serum thyroid stimulating hormone (TSH) level 04/23/2019   MDD (major depressive disorder),  recurrent severe, 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   OSA on CPAP 03/17/2019   Anxiety and depression 03/12/2019   Chronic migraine with aura 03/12/2019   Essential hypertension 03/12/2019   Mild intermittent asthma without complication 03/12/2019   Lymphocytic hypophysitis (HCC)  08/15/2016   Elevated prolactin level 07/05/2016   Vulvar lesion 07/05/2016   Restless legs 05/24/2016   Seasonal allergies 05/13/2014   Intertrigo 05/13/2014   Decreased vision 05/13/2014   PCOS (polycystic ovarian syndrome) 02/07/2014   Social phobia 02/02/2007   ADD 02/02/2007   DYSLEXIA 02/02/2007   Asthma, severe persistent 02/02/2007   SCOLIOSIS 02/02/2007   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 Yes Did it involve sudden or severe rash/hives, skin peeling, or any reaction on the inside of your mouth or nose? No Did you need to seek medical attention at a hospital or doctor's office? Yes When did it last happen?      >10 years ago If all above answers are "NO", may proceed with cephalosporin use.    Contrast Media [Iodinated Contrast Media] Nausea And Vomiting   Latex Hives and Swelling   Multihance [Gadobenate] Nausea And Vomiting   Other     Pickles - throat swelling and nausea    Medications:  Current Outpatient Medications:    benzonatate (TESSALON) 100 MG capsule, Take 1 capsule (100 mg total) by mouth 3 (three) times daily as needed for cough., Disp: 30 capsule, Rfl: 0   fluticasone (FLONASE) 50 MCG/ACT nasal spray, Place 2 sprays into both nostrils daily., Disp: 16 g, Rfl: 0   predniSONE (DELTASONE) 20 MG tablet, Take 2 tablets (40 mg total) by mouth daily with breakfast., Disp: 10 tablet, Rfl: 0   promethazine-dextromethorphan (PROMETHAZINE-DM) 6.25-15 MG/5ML syrup, Take 5 mLs by mouth 4 (four) times daily as needed for cough., Disp: 118 mL, Rfl: 0   albuterol (VENTOLIN HFA) 108 (90 Base) MCG/ACT inhaler, Inhale 2 puffs into the lungs every 6 (six) hours as needed for wheezing or shortness of breath., Disp: 6.7 g,  Rfl: 3   Benzocaine-Resorcinol (VAGISIL EX), Apply 1 application. topically as needed., Disp: , Rfl:    busPIRone (BUSPAR) 10 MG tablet, Take 1 tablet (10 mg total) by mouth 3 (three) times daily., Disp: 90 tablet, Rfl: 3   cholestyramine (QUESTRAN) 4 g packet, Take 1 packet twice daily between meals, Disp: 60 each, Rfl: 6   diclofenac Sodium (VOLTAREN) 1 % GEL, Apply 2 g topically 4 (four) times daily. (Patient taking differently: Apply 2 g topically as needed.), Disp: 100 g, Rfl: 0   gabapentin (NEURONTIN) 300 MG capsule, TAKE 1 CAPSULE (300 MG TOTAL) BY MOUTH AT BEDTIME. (Patient taking differently: Take 300 mg by mouth as needed (pain).), Disp: 30 capsule, Rfl: 3   hydrOXYzine (ATARAX) 25 MG tablet, TAKE 1 TABLET (25 MG TOTAL) BY MOUTH 3 (THREE) TIMES DAILY AS NEEDED FOR ANXIETY., Disp: 90 tablet, Rfl: 3   ipratropium-albuterol (DUONEB) 0.5-2.5 (3) MG/3ML SOLN, Take 3 mLs by nebulization every 4 (four) hours as needed., Disp: , Rfl:    loperamide (IMODIUM A-D) 2 MG tablet, Take 2 mg by mouth 4 (four) times daily as needed for diarrhea or loose stools., Disp: , Rfl:    naproxen (NAPROSYN) 375 MG tablet, Take 1  tablet (375 mg total) by mouth 2 (two) times daily with a meal., Disp: 20 tablet, Rfl: 0   omeprazole (PRILOSEC) 20 MG capsule, Take 1 capsule (20 mg total) by mouth daily., Disp: 30 capsule, Rfl: 3   propranolol (INDERAL) 20 MG tablet, Take 1 tablet (20 mg total) by mouth 2 (two) times daily. For anxiety, Disp: 60 tablet, Rfl: 2   QUEtiapine (SEROQUEL) 50 MG tablet, Take 1 tablet (50 mg total) by mouth at bedtime., Disp: 30 tablet, Rfl: 3   sertraline (ZOLOFT) 100 MG tablet, Take 2 tablets (200 mg total) by mouth at bedtime., Disp: 60 tablet, Rfl: 3   traZODone (DESYREL) 100 MG tablet, Take 1 tablet (100 mg total) by mouth at bedtime as needed for sleep., Disp: 30 tablet, Rfl: 3  Observations/Objective: Patient is well-developed, well-nourished in no acute distress.  Resting comfortably  at home.  Head is normocephalic, atraumatic.  No labored breathing. Speech is clear and coherent with logical content.  Patient is alert and oriented at baseline.   Assessment and Plan: 1. Acute viral bronchitis - promethazine-dextromethorphan (PROMETHAZINE-DM) 6.25-15 MG/5ML syrup; Take 5 mLs by mouth 4 (four) times daily as needed for cough.  Dispense: 118 mL; Refill: 0 - benzonatate (TESSALON) 100 MG capsule; Take 1 capsule (100 mg total) by mouth 3 (three) times daily as needed for cough.  Dispense: 30 capsule; Refill: 0 - fluticasone (FLONASE) 50 MCG/ACT nasal spray; Place 2 sprays into both nostrils daily.  Dispense: 16 g; Refill: 0 - predniSONE (DELTASONE) 20 MG tablet; Take 2 tablets (40 mg total) by mouth daily with breakfast.  Dispense: 10 tablet; Refill: 0  2. Mild intermittent asthma with exacerbation - fluticasone (FLONASE) 50 MCG/ACT nasal spray; Place 2 sprays into both nostrils daily.  Dispense: 16 g; Refill: 0 - predniSONE (DELTASONE) 20 MG tablet; Take 2 tablets (40 mg total) by mouth daily with breakfast.  Dispense: 10 tablet; Refill: 0  Will have her COVID test just to be cautious.  She is to let us know if this is positive.  Supportive measures and OTC medications reviewed.  Given exacerbation of asthma we will have her continue albuterol, but will add on a short burst of prednisone.  Will have her start Flonase daily as well.  Tessalon for daytime cough.  Promethazine DM to be used at night.  Can also use OTC plain Mucinex throughout the day.  Strict in person follow-up precautions reviewed.  Follow Up Instructions: I discussed the assessment and treatment plan with the patient. The patient was provided an opportunity to ask questions and all were answered. The patient agreed with the plan and demonstrated an understanding of the instructions.  A copy of instructions were sent to the patient via MyChart unless otherwise noted below.   The patient was advised to call back  or seek an in-person evaluation if the symptoms worsen or if the condition fails to improve as anticipated.  Time:  I spent 10 minutes with the patient via telehealth technology discussing the above problems/concerns.    Piedad Climes, PA-C

## 2022-06-11 NOTE — Patient Instructions (Signed)
[name], thank you for joining Piedad Climes, PA-C for today's virtual visit.  While this provider is not your primary care provider (PCP), if your PCP is located in our provider database this encounter information will be shared with them immediately following your visit.   A [facility] MyChart account gives you access to today's visit and all your visits, tests, and labs performed at [facility] " click here if you don't have a [facility] MyChart account or go to [email or web address]  Consent: (Patient) Ozetta Frary provided verbal consent for this virtual visit at the beginning of the encounter.  Current Medications:  Current Outpatient Medications:    albuterol (VENTOLIN HFA) 108 (90 Base) MCG/ACT inhaler, Inhale 2 puffs into the lungs every 6 (six) hours as needed for wheezing or shortness of breath., Disp: 6.7 g, Rfl: 3   Benzocaine-Resorcinol (VAGISIL EX), Apply 1 application. topically as needed., Disp: , Rfl:    busPIRone (BUSPAR) 10 MG tablet, Take 1 tablet (10 mg total) by mouth 3 (three) times daily., Disp: 90 tablet, Rfl: 3   cholestyramine (QUESTRAN) 4 g packet, Take 1 packet twice daily between meals, Disp: 60 each, Rfl: 6   diclofenac Sodium (VOLTAREN) 1 % GEL, Apply 2 g topically 4 (four) times daily. (Patient taking differently: Apply 2 g topically as needed.), Disp: 100 g, Rfl: 0   gabapentin (NEURONTIN) 300 MG capsule, TAKE 1 CAPSULE (300 MG TOTAL) BY MOUTH AT BEDTIME. (Patient taking differently: Take 300 mg by mouth as needed (pain).), Disp: 30 capsule, Rfl: 3   hydrOXYzine (ATARAX) 25 MG tablet, TAKE 1 TABLET (25 MG TOTAL) BY MOUTH 3 (THREE) TIMES DAILY AS NEEDED FOR ANXIETY., Disp: 90 tablet, Rfl: 3   ipratropium-albuterol (DUONEB) 0.5-2.5 (3) MG/3ML SOLN, Take 3 mLs by nebulization every 4 (four) hours as needed., Disp: , Rfl:    loperamide (IMODIUM A-D) 2 MG tablet, Take 2 mg by mouth 4 (four) times daily as needed for  diarrhea or loose stools., Disp: , Rfl:    naproxen (NAPROSYN) 375 MG tablet, Take 1 tablet (375 mg total) by mouth 2 (two) times daily with a meal., Disp: 20 tablet, Rfl: 0   omeprazole (PRILOSEC) 20 MG capsule, Take 1 capsule (20 mg total) by mouth daily., Disp: 30 capsule, Rfl: 3   propranolol (INDERAL) 20 MG tablet, Take 1 tablet (20 mg total) by mouth 2 (two) times daily. For anxiety, Disp: 60 tablet, Rfl: 2   QUEtiapine (SEROQUEL) 50 MG tablet, Take 1 tablet (50 mg total) by mouth at bedtime., Disp: 30 tablet, Rfl: 3   sertraline (ZOLOFT) 100 MG tablet, Take 2 tablets (200 mg total) by mouth at bedtime., Disp: 60 tablet, Rfl: 3   traZODone (DESYREL) 100 MG tablet, Take 1 tablet (100 mg total) by mouth at bedtime as needed for sleep., Disp: 30 tablet, Rfl: 3   Medications ordered in this encounter:  No orders of the defined types were placed in this encounter.    *If you need refills on other medications prior to your next appointment, please contact your pharmacy*  Follow-Up: Call back or seek an in-person evaluation if the symptoms worsen or if the condition fails to improve as anticipated.  [facility] [phone]  Other Instructions Please keep hydrated and rest. Take the prednisone as directed.  Continue use of albuterol inhaler as directed when needed. Okay to utilize plain Mucinex over-the-counter. The Tessalon can be used for daytime cough. The prescription cough syrup  is to be used in the evening. If symptoms or not easing up or you note any worsening symptoms despite treatment, you need to seek an in person evaluation ASAP.  Please do not delay care.   If you have been instructed to have an in-person evaluation today at a local Urgent Care facility, please use the link below. It will take you to a list of all of our available [facility], including address, phone number and hours of operation. Please do not delay care.  [facility]  If you or a family member do not have a primary care provider, use the link below to schedule a visit and establish care. When you choose a [facility] primary care physician or advanced practice provider, you gain a long-term partner in health. Find a Primary Care Provider  Learn more about [facility] in-office and virtual care options: [facility] - Get Care Now

## 2022-06-12 ENCOUNTER — Other Ambulatory Visit: Payer: Self-pay

## 2022-06-12 ENCOUNTER — Telehealth (INDEPENDENT_AMBULATORY_CARE_PROVIDER_SITE_OTHER): Payer: Medicaid Other | Admitting: Psychiatry

## 2022-06-12 ENCOUNTER — Encounter (HOSPITAL_COMMUNITY): Payer: Self-pay | Admitting: Psychiatry

## 2022-06-12 DIAGNOSIS — F4010 Social phobia, unspecified: Secondary | ICD-10-CM

## 2022-06-12 DIAGNOSIS — F333 Major depressive disorder, recurrent, severe with psychotic symptoms: Secondary | ICD-10-CM | POA: Diagnosis not present

## 2022-06-12 MED ORDER — HYDROXYZINE HCL 25 MG PO TABS
25.0000 mg | ORAL_TABLET | Freq: Three times a day (TID) | ORAL | 3 refills | Status: DC | PRN
  Filled 2022-06-12: qty 90, 30d supply, fill #0

## 2022-06-12 MED ORDER — TRAZODONE HCL 100 MG PO TABS
100.0000 mg | ORAL_TABLET | Freq: Every evening | ORAL | 3 refills | Status: DC | PRN
  Filled 2022-06-12: qty 30, 30d supply, fill #0

## 2022-06-12 MED ORDER — SERTRALINE HCL 100 MG PO TABS
200.0000 mg | ORAL_TABLET | Freq: Every day | ORAL | 3 refills | Status: DC
  Filled 2022-06-12: qty 60, 30d supply, fill #0

## 2022-06-12 MED ORDER — QUETIAPINE FUMARATE 50 MG PO TABS
50.0000 mg | ORAL_TABLET | Freq: Every day | ORAL | 3 refills | Status: DC
  Filled 2022-06-12: qty 30, 30d supply, fill #0

## 2022-06-12 MED ORDER — BUSPIRONE HCL 10 MG PO TABS
10.0000 mg | ORAL_TABLET | Freq: Three times a day (TID) | ORAL | 3 refills | Status: DC
  Filled 2022-06-12: qty 90, 30d supply, fill #0

## 2022-06-12 NOTE — Progress Notes (Signed)
BH MD/PA/NP OP Progress Note Virtual Visit via Video Note  I connected with Kristen [name] on 06/12/22 at  3:00 PM EDT by a video enabled telemedicine application and verified that I am speaking with the correct person using two identifiers.  Location: Patient: Home Provider: Clinic   I discussed the limitations of evaluation and management by telemedicine and the availability of in person appointments. The patient expressed understanding and agreed to proceed.  I provided 30 minutes of non-face-to-face time during this encounter.    06/12/2022 3:11 PM Kristen [name]  MRN:  [id number]  Chief Complaint: "I could be better"  HPI:  37 year old female seen today for follow up psychiatric evaluation.  She has a psychiatric history of social phobia, ADD, anxiety, depression, and PTSD.  She is currently being managed on Seroquel  50 mg nightly, Buspar 10 mg three times daily,  Zoloft 200 mg daily, trazodone 50 mg nightly as needed, hydroxyzine 25 mg 3 times a day as needed, gabapentin 300 mg nightly (prescribed by PCP), and propanolol 20 mg 2 times daily (recives from PCP).  She notes that her medications are effective in managing her psychiatric conditions.   Today patient was well-groomed, pleasant, cooperative, engaged in conversation, and maintained eye contact.  She informed provider that she she could be better.  She informed Clinical research associate that she has been coughing and needing breathing treatments.  Patient informed Clinical research associate that she was able to see an on-call PCP and was prescribed medication to help manage her current illness.  Mentally she reports that things have improved since her last visit.  She notes that she continues to worry about her parents.  She informed Clinical research associate that her father's toe was recently amputated and her mother health is still declining.  Despite the stressors patient notes that her mood is stable and reports that he has minimal anxiety and depression.  Today  provider conducted a GAD 7  and patient scored an 8, at her last visit she scored an 18.  Provider also conducted PHQ-9 and patient scored a 12, at her last visit she scored a 24.  She endorses fluctuations in sleep and adequate appetite.  Patient endorses passive SI but denies wanting to harm herself.  Today she denies SI/HI/VAH, mania, or paranoia.  No medication changes made today.  Patient agreeable to continue medications as prescribed.  No other concerns noted at this time.  No other concerns at this time.    Visit Diagnosis:    ICD-10-CM   1. Social phobia  F40.10 busPIRone (BUSPAR) 10 MG tablet    hydrOXYzine (ATARAX) 25 MG tablet    sertraline (ZOLOFT) 100 MG tablet    traZODone (DESYREL) 100 MG tablet    2. Severe episode of recurrent major depressive disorder, with psychotic features (HCC)  F33.3 busPIRone (BUSPAR) 10 MG tablet    QUEtiapine (SEROQUEL) 50 MG tablet    sertraline (ZOLOFT) 100 MG tablet    traZODone (DESYREL) 100 MG tablet      Past Psychiatric History: social phobia, ADD, anxiety, depression, and PTSD.  Past Medical History:  Past Medical History:  Diagnosis Date   Anxiety 2013   treated at [facility] by Deatra Robinson    Asthma 1989    no hospitalization, or intubation, previously on advair and singulair. asthma worsening.    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 , dx in utero    Depression 2001   depressed since childhood    Dysrhythmia  2010   PVC's   Essential hypertension 03/12/2019   GERD (gastroesophageal reflux disease)    History of hiatal hernia    History of suicidal ideation    IBS (irritable bowel syndrome)    Kyphosis of thoracic region 2004    painful, runs in dad side    Migraine    Pancreatitis 2003   gallstone pancreatitis    Pancreatitis due to biliary obstruction 2003   PCOS (polycystic ovarian syndrome) 2014   facial hair, irregular periods, never been pregnant    Sleep apnea    no longer uses cpap    Past Surgical  History:  Procedure Laterality Date   BIOPSY  06/11/2021   Procedure: BIOPSY;  Surgeon: Napoleon Form, MD;  Location: [facility];  Service: Gastroenterology;;   BLADDER SURGERY     CHOLECYSTECTOMY  2003    COLONOSCOPY WITH PROPOFOL N/A 06/11/2021   Procedure: COLONOSCOPY WITH PROPOFOL;  Surgeon: Napoleon Form, MD;  Location: [facility];  Service: Gastroenterology;  Laterality: N/A;   DILATATION & CURETTAGE/HYSTEROSCOPY WITH MYOSURE N/A 12/14/2019   Procedure: DILATATION & CURETTAGE/HYSTEROSCOPY WITH Polypectomy with MYOSURE;  Surgeon: Tereso Newcomer, MD;  Location: [facility];  Service: Gynecology;  Laterality: N/A;   OPEN REDUCTION INTERNAL FIXATION (ORIF) PROXIMAL PHALANX Right 07/23/2013   Procedure: OPEN TREATMENT RIGHT RING FINGER, PROXIMAL INTERPHALANGEAL/JOINT FRACTURE DISLOCATION;  Surgeon: Jodi Marble, MD;  Location: [facility];  Service: Orthopedics;  Laterality: Right;   TONSILLECTOMY     and adenoidectomy    Family Psychiatric History: Mother PTSD, Father Bipolar disorder and PTSD  Family History:  Family History  Problem Relation Age of Onset   Hypertension Mother    Arnold-Chiari malformation Mother    Restless legs syndrome Mother    Osteoporosis Mother    COPD Mother    Asthma Mother    Squamous cell carcinoma Mother        skin    Eczema Mother    Arthritis Mother    Peripheral Artery Disease Mother    Anxiety disorder Mother    OCD Mother    Colonic polyp Mother    Hypertension Father    Scoliosis Father    Bipolar disorder Father    Congestive Heart Failure Father    COPD Father    Depression Father    Diabetes Maternal Grandmother    Hypertension Maternal Grandmother    Dementia Maternal Grandmother    OCD Maternal Grandmother    Bone cancer Maternal Grandfather 5       bone marrow    Multiple myeloma Maternal Grandfather    Alcohol abuse Maternal Grandfather    Drug abuse Maternal Grandfather    Cancer Paternal  Grandmother        colon   Diabetes Paternal Grandfather    Alcohol abuse Paternal Grandfather    Drug abuse Paternal Grandfather    Dementia Paternal Grandfather    Alcohol abuse Maternal Aunt    Depression Maternal Aunt    Pancreatic cancer Maternal Aunt    Alcohol abuse Paternal Aunt    Depression Paternal Aunt    ADD / ADHD Cousin     Social History:  Social History   Socioeconomic History   Marital status: Single    Spouse name: Not on file   Number of children: 0   Years of education: college    Highest education level: Not on file  Occupational History   Occupation: Unemployed   Tobacco Use  Smoking status: Never   Smokeless tobacco: Never  Vaping Use   Vaping Use: Never used  Substance and Sexual Activity   Alcohol use: No   Drug use: No   Sexual activity: Yes    Birth control/protection: None  Other Topics Concern   Not on file  Social History Narrative   Lives with mom Steward Drone)    Right-handed   Caffeine: 3 cups of coffee per day   Social Determinants of Health   Financial Resource Strain: Not on file  Food Insecurity: Food Insecurity Present (08/17/2020)   Hunger Vital Sign    Worried About Running Out of Food in the Last Year: Often true    Ran Out of Food in the Last Year: Sometimes true  Transportation Needs: No Transportation Needs (08/17/2020)   PRAPARE - Administrator, Civil Service (Medical): No    Lack of Transportation (Non-Medical): No  Physical Activity: Not on file  Stress: Not on file  Social Connections: Not on file    Allergies:  Allergies  Allergen Reactions   Mucinex [Guaifenesin Er] Hives, Itching and Swelling   Penicillins Swelling    Did it involve swelling of the face/tongue/throat, SOB, or low BP? Yes Did it involve sudden or severe rash/hives, skin peeling, or any reaction on the inside of your mouth or nose? No Did you need to seek medical attention at a hospital or doctor's office? Yes When did it last  happen?      >10 years ago If all above answers are "NO", may proceed with cephalosporin use.    Contrast Media [Iodinated Contrast Media] Nausea And Vomiting   Latex Hives and Swelling   Multihance [Gadobenate] Nausea And Vomiting   Other     Pickles - throat swelling and nausea     Metabolic Disorder Labs: Lab Results  Component Value Date   HGBA1C 4.9 04/15/2019   MPG 93.93 04/15/2019   Lab Results  Component Value Date   PROLACTIN 15.3 11/01/2020   PROLACTIN 20.5 07/14/2020   Lab Results  Component Value Date   CHOL 204 (H) 04/15/2019   TRIG 146 04/15/2019   HDL 41 04/15/2019   CHOLHDL 5.0 04/15/2019   VLDL 29 04/15/2019   LDLCALC 134 (H) 04/15/2019   Lab Results  Component Value Date   TSH 2.99 11/01/2020   TSH 4.628 (H) 04/15/2019    Therapeutic Level Labs: No results found for: "LITHIUM" No results found for: "VALPROATE" No results found for: "CBMZ"  Current Medications: Current Outpatient Medications  Medication Sig Dispense Refill   albuterol (VENTOLIN HFA) 108 (90 Base) MCG/ACT inhaler Inhale 2 puffs into the lungs every 6 (six) hours as needed for wheezing or shortness of breath. 6.7 g 3   Benzocaine-Resorcinol (VAGISIL EX) Apply 1 application. topically as needed.     benzonatate (TESSALON) 100 MG capsule Take 1 capsule (100 mg total) by mouth 3 (three) times daily as needed for cough. 30 capsule 0   busPIRone (BUSPAR) 10 MG tablet Take 1 tablet (10 mg total) by mouth 3 (three) times daily. 90 tablet 3   cholestyramine (QUESTRAN) 4 g packet Take 1 packet twice daily between meals 60 each 6   diclofenac Sodium (VOLTAREN) 1 % GEL Apply 2 g topically 4 (four) times daily. (Patient taking differently: Apply 2 g topically as needed.) 100 g 0   fluticasone (FLONASE) 50 MCG/ACT nasal spray Place 2 sprays into both nostrils daily. 16 g 0   gabapentin (NEURONTIN) 300  MG capsule TAKE 1 CAPSULE (300 MG TOTAL) BY MOUTH AT BEDTIME. (Patient taking differently: Take  300 mg by mouth as needed (pain).) 30 capsule 3   hydrOXYzine (ATARAX) 25 MG tablet Take 1 tablet (25 mg total) by mouth 3 (three) times daily as needed for anxiety. 90 tablet 3   ipratropium-albuterol (DUONEB) 0.5-2.5 (3) MG/3ML SOLN Take 3 mLs by nebulization every 4 (four) hours as needed.     loperamide (IMODIUM A-D) 2 MG tablet Take 2 mg by mouth 4 (four) times daily as needed for diarrhea or loose stools.     naproxen (NAPROSYN) 375 MG tablet Take 1 tablet (375 mg total) by mouth 2 (two) times daily with a meal. 20 tablet 0   omeprazole (PRILOSEC) 20 MG capsule Take 1 capsule (20 mg total) by mouth daily. 30 capsule 3   predniSONE (DELTASONE) 20 MG tablet Take 2 tablets (40 mg total) by mouth daily with breakfast. 10 tablet 0   promethazine-dextromethorphan (PROMETHAZINE-DM) 6.25-15 MG/5ML syrup Take 5 mLs by mouth 4 (four) times daily as needed for cough. 118 mL 0   propranolol (INDERAL) 20 MG tablet Take 1 tablet (20 mg total) by mouth 2 (two) times daily. For anxiety 60 tablet 2   QUEtiapine (SEROQUEL) 50 MG tablet Take 1 tablet (50 mg total) by mouth at bedtime. 30 tablet 3   sertraline (ZOLOFT) 100 MG tablet Take 2 tablets (200 mg total) by mouth at bedtime. 60 tablet 3   traZODone (DESYREL) 100 MG tablet Take 1 tablet (100 mg total) by mouth at bedtime as needed for sleep. 30 tablet 3   No current facility-administered medications for this visit.     Musculoskeletal: Strength & Muscle Tone: within normal limits and telehealth visit Gait & Station: normal, telehealth visit Patient leans: N/A  Psychiatric Specialty Exam: Review of Systems  There were no vitals taken for this visit.There is no height or weight on file to calculate BMI.  General Appearance: Well Groomed  Eye Contact:  Good  Speech:  Clear and Coherent and Normal Rate  Volume:  Normal  Mood:  Euthymic  Affect:  Congruent  Thought Process:  Coherent, Goal Directed and Linear  Orientation:  Full (Time, Place, and  Person)  Thought Content: WDL and Logical   Suicidal Thoughts:  Yes.  without intent/plan  Homicidal Thoughts:  No  Memory:  Immediate;   Good Recent;   Good Remote;   Good  Judgement:  Good  Insight:  Good  Psychomotor Activity:  Normal  Concentration:  Concentration: Good and Attention Span: Good  Recall:  Good  Fund of Knowledge: Good  Language: Good  Akathisia:  No  Handed:  Right  AIMS (if indicated): Not done  Assets:  Communication Skills Desire for Improvement Financial Resources/Insurance Housing Social Support  ADL's:  Intact  Cognition: WNL  Sleep:  Fair   Screenings: AIMS    Flowsheet Row Ad[facility] (Discharged) [facility]  AIMS Total Score 0      AUDIT    Flowsheet Row Admission (Discharged) [facility]  Alcohol Use Disorder Identification Test Final Score (AUDIT) 0      GAD-7    Flowsheet Row Video Visit from 06/12/2022 in [facility] Video Visit from 04/03/2022 in [facility] Video Visit from 07/23/2021 in [facility] Video Visit from 02/16/2021 in  [facility] Video Visit from 11/14/2020 in [facility]  Total GAD-7 Score 8 18 16 16 19       PHQ2-9    Flowsheet Row Video Visit from 06/12/2022 in [facility] Video Visit from 04/03/2022 in [facility] Video Visit from 07/23/2021 in [facility] Video Visit from 02/16/2021 in [facility] Counselor from 12/13/2020 in [facility]  PHQ-2 Total Score 3 6 4 6 6   PHQ-9 Total Score 12 24 17 20 20       Flowsheet Row Video Visit from 04/03/2022 in [facility] Video Visit from  07/23/2021 in [facility] ED from 06/14/2021 in [facility] at [facility]  C-SSRS RISK CATEGORY Error: Q7 should not be populated when Q6 is No Error: Q7 should not be populated when Q6 is No No Risk        Assessment and Plan: Patient reports that her anxiety, depression, and mood has improved since her last visit.  She also denies symptoms of psychosis today.  No medication changes made.  Patient agreeable to continue medication as prescribed.    1. Social phobia  Continue- busPIRone (BUSPAR) 10 MG tablet; Take 1 tablet (10 mg total) by mouth 3 (three) times daily.  Dispense: 90 tablet; Refill: 3 Continue- traZODone (DESYREL) 100 MG tablet; Take 1 tablet (100 mg total) by mouth at bedtime as needed for sleep.  Dispense: 30 tablet; Refill: 3 Continue- sertraline (ZOLOFT) 100 MG tablet; Take 2 tablets (200 mg total) by mouth at bedtime.  Dispense: 60 tablet; Refill: 3 Continue- hydrOXYzine (ATARAX) 25 MG tablet; TAKE 1 TABLET (25 MG TOTAL) BY MOUTH 3 (THREE) TIMES DAILY AS NEEDED FOR ANXIETY.  Dispense: 90 tablet; Refill: 3  2. Severe episode of recurrent major depressive disorder, with psychotic features (HCC)  Continue- busPIRone (BUSPAR) 10 MG tablet; Take 1 tablet (10 mg total) by mouth 3 (three) times daily.  Dispense: 90 tablet; Refill: 3 Continue t- QUEtiapine (SEROQUEL) 50 MG tablet; Take 1 tablet (50 mg total) by mouth at bedtime.  Dispense: 30 tablet; Refill: 3 Continue- traZODone (DESYREL) 100 MG tablet; Take 1 tablet (100 mg total) by mouth at bedtime as needed for sleep.  Dispense: 30 tablet; Refill: 3 Continue- sertraline (ZOLOFT) 100 MG tablet; Take 2 tablets (200 mg total) by mouth at bedtime.  Dispense: 60 tablet; Refill: 3  Follow-up in 3 months Follow-up therapy Shanna Cisco, NP 06/12/2022, 3:11 PM

## 2022-06-13 ENCOUNTER — Telehealth: Payer: Self-pay

## 2022-06-13 NOTE — Telephone Encounter (Signed)
Patient states that someone wanted her to have labs prior to her appt  for pituitary leision. She has been sick. Her appt is 5/16. Does she need to coe here to do labs? Please advise if she needs to come early.

## 2022-06-18 ENCOUNTER — Other Ambulatory Visit: Payer: Self-pay | Admitting: "Endocrinology

## 2022-06-18 ENCOUNTER — Other Ambulatory Visit: Payer: Self-pay

## 2022-06-18 ENCOUNTER — Other Ambulatory Visit (INDEPENDENT_AMBULATORY_CARE_PROVIDER_SITE_OTHER): Payer: Medicaid Other

## 2022-06-18 DIAGNOSIS — E282 Polycystic ovarian syndrome: Secondary | ICD-10-CM

## 2022-06-18 DIAGNOSIS — E237 Disorder of pituitary gland, unspecified: Secondary | ICD-10-CM

## 2022-06-18 LAB — COMPREHENSIVE METABOLIC PANEL
ALT: 13 U/L (ref 0–35)
AST: 14 U/L (ref 0–37)
Albumin: 3.7 g/dL (ref 3.5–5.2)
Alkaline Phosphatase: 110 U/L (ref 39–117)
BUN: 12 mg/dL (ref 6–23)
CO2: 30 mEq/L (ref 19–32)
Calcium: 8.9 mg/dL (ref 8.4–10.5)
Chloride: 101 mEq/L (ref 96–112)
Creatinine, Ser: 0.99 mg/dL (ref 0.40–1.20)
GFR: 73.95 mL/min (ref >60.00)
Glucose, Bld: 85 mg/dL (ref 70–99)
Potassium: 3.3 mEq/L — ABNORMAL LOW (ref 3.5–5.1)
Sodium: 140 mEq/L (ref 135–145)
Total Bilirubin: 0.6 mg/dL (ref 0.2–1.2)
Total Protein: 6.5 g/dL (ref 6.0–8.3)

## 2022-06-18 LAB — LIPID PANEL
Cholesterol: 177 mg/dL (ref 0–200)
HDL: 42.4 mg/dL (ref >39.00)
LDL Cholesterol: 109 mg/dL — ABNORMAL HIGH (ref 0–99)
NonHDL: 135.01
Total CHOL/HDL Ratio: 4
Triglycerides: 128 mg/dL (ref 0.0–149.0)
VLDL: 25.6 mg/dL (ref 0.0–40.0)

## 2022-06-18 LAB — HEMOGLOBIN A1C: Hgb A1c MFr Bld: 5.2 % (ref 4.6–6.5)

## 2022-06-18 LAB — T4, FREE: Free T4: 1.05 ng/dL (ref 0.60–1.60)

## 2022-06-18 LAB — TSH: TSH: 3.01 u[IU]/mL (ref 0.35–5.50)

## 2022-06-18 NOTE — Telephone Encounter (Signed)
Message left on patient's cell phone and MyChart message sent for patient to call office to schedule lab appointment at 8:00 AM 06/19/2022.

## 2022-06-18 NOTE — Telephone Encounter (Signed)
Can you help with getting this patient scheduled? She needs labs tomorrow morning at 8 am.

## 2022-06-19 ENCOUNTER — Other Ambulatory Visit (INDEPENDENT_AMBULATORY_CARE_PROVIDER_SITE_OTHER): Payer: Medicaid Other

## 2022-06-19 DIAGNOSIS — E237 Disorder of pituitary gland, unspecified: Secondary | ICD-10-CM

## 2022-06-19 LAB — BASIC METABOLIC PANEL
BUN: 12 mg/dL (ref 6–23)
CO2: 30 mEq/L (ref 19–32)
Calcium: 9.5 mg/dL (ref 8.4–10.5)
Chloride: 100 mEq/L (ref 96–112)
Creatinine, Ser: 1.04 mg/dL (ref 0.40–1.20)
GFR: 69.7 mL/min (ref >60.00)
Glucose, Bld: 106 mg/dL — ABNORMAL HIGH (ref 70–99)
Potassium: 3.5 mEq/L (ref 3.5–5.1)
Sodium: 139 mEq/L (ref 135–145)

## 2022-06-19 LAB — INSULIN, RANDOM: Insulin: 38.1 u[IU]/mL — ABNORMAL HIGH

## 2022-06-19 LAB — CORTISOL: Cortisol, Plasma: 11.5 ug/dL

## 2022-06-19 LAB — FOLLICLE STIMULATING HORMONE: FSH: 5.4 m[IU]/mL

## 2022-06-19 LAB — LUTEINIZING HORMONE: LH: 3.42 m[IU]/mL

## 2022-06-20 ENCOUNTER — Ambulatory Visit (INDEPENDENT_AMBULATORY_CARE_PROVIDER_SITE_OTHER): Payer: Medicaid Other | Admitting: "Endocrinology

## 2022-06-20 ENCOUNTER — Other Ambulatory Visit: Payer: Self-pay

## 2022-06-20 ENCOUNTER — Encounter: Payer: Self-pay | Admitting: "Endocrinology

## 2022-06-20 VITALS — BP 130/82 | HR 98 | Resp 98 | Ht 66.0 in | Wt 314.4 lb

## 2022-06-20 DIAGNOSIS — E88819 Insulin resistance, unspecified: Secondary | ICD-10-CM | POA: Diagnosis not present

## 2022-06-20 DIAGNOSIS — E237 Disorder of pituitary gland, unspecified: Secondary | ICD-10-CM | POA: Diagnosis not present

## 2022-06-20 DIAGNOSIS — E7800 Pure hypercholesterolemia, unspecified: Secondary | ICD-10-CM | POA: Diagnosis not present

## 2022-06-20 DIAGNOSIS — Z8639 Personal history of other endocrine, nutritional and metabolic disease: Secondary | ICD-10-CM

## 2022-06-20 DIAGNOSIS — Z6841 Body Mass Index (BMI) 40.0 and over, adult: Secondary | ICD-10-CM

## 2022-06-20 MED ORDER — OZEMPIC (0.25 OR 0.5 MG/DOSE) 2 MG/3ML ~~LOC~~ SOPN
0.2500 mg | PEN_INJECTOR | SUBCUTANEOUS | 1 refills | Status: DC
Start: 2022-06-20 — End: 2022-07-25
  Filled 2022-06-20 – 2022-07-16 (×2): qty 3, 28d supply, fill #0

## 2022-06-20 NOTE — Progress Notes (Signed)
Outpatient Endocrinology Note [place]en [place], MD    [name] 14-Aug-1985 [id number]  Referring Provider: Marcine Matar, MD Primary Care Provider: Marcine Matar, MD Reason for consultation: Subjective   Assessment & Plan  Kristen [name] was seen today for follow-up.  Diagnoses and all orders for this visit:  Pituitary abnormality (HCC)  History of hyperprolactinemia  Insulin resistance -     Amb Referral to Bariatric Surgery -     Semaglutide,0.25 or 0.5MG /DOS, (OZEMPIC, 0.25 OR 0.5 MG/DOSE,) 2 MG/1.5ML SOPN; Inject 0.25 mg into the skin once a week.  Pure hypercholesterolemia  Class 3 severe obesity due to excess calories with serious comorbidity and body mass index (BMI) of 50.0 to 59.9 in adult [facility]) -     Amb Referral to Bariatric Surgery -     Semaglutide,0.25 or 0.5MG /DOS, (OZEMPIC, 0.25 OR 0.5 MG/DOSE,) 2 MG/1.5ML SOPN; Inject 0.25 mg into the skin once a week.    History of lymphocytic hypophysitis Last MRI brain without in 2021 showed persistent thickened appearance of pituitary stalk probably unchanged compared with previous MRI in 2018, no new symptoms an normal labs do no warrant repeat MRI So far pituitary workup WNL including FT4, TSH, prolactin, FSH, LH, estradiol, cortisol Pending labs include IGF, growth hormone, ACTH Per history patient had history of hyperprolactinemia diagnosed in 2018 and took bromocriptine in 2019 last, had normal ACTH test Will follow-up labs and continue to monitor  History of PCOS, has obesity complicated by hyperlipidemia and insulin resistance, Body mass index is 50.75 kg/m. Pt did not tolerate metformin in past Denied weight loss surgery under pt assistance, now has medicaid Pt would like to retry referral for bariatric surgery, ordered it Pt would like to try ozempic, ordered it No history of MEN syndrome/medullary thyroid cancer/pancreatitis or pancreatic cancer in self or family Educated on risks and  side effects of ozempic including but not limited to pancreatitis, pancreatic cancer and medullary thyroid cancer in mice. Patient verbalized adequate understanding.     Return in about 1 year (around 06/20/2023) for visit.   I have reviewed current medications, nurse's notes, allergies, vital signs, past medical and surgical history, family medical history, and social history for this encounter. Counseled patient on symptoms, examination findings, lab findings, imaging results, treatment decisions and monitoring and prognosis. The patient understood the recommendations and agrees with the treatment plan. All questions regarding treatment plan were fully answered.  Kristen [place], MD  06/20/22   History of Present Illness HPI  Kristen [name] is a 37 y.o. year old female who presents for follow up on hypophysitis.  Current regimen: No pituitary related medication  Patient reports she gets bad head aches, which is the same as it was back in 2018 Sometimes gets blurry vision/flashes  Has been diagnosed with migraines with aura No galactorrhea  Stopped metformin due to being sick Weight loss surgery was denied   Mother has history of arnold chiari malformation II  Family history is negative for pituitary tumor or other abnormalities concerning for MEN syndrome.     Per prior history: She has hyperprolactinemia (dx'ed 2018, when she presented with galactorrhea; she also had pituitary microadenoma, PCO, and lymphocytic hypophysitis; ACTH test was normal).  She has orange card.  She last took parlodel in 2019.  She has heavy and prolonged menses.   03/31/2019 MRI Brain WO: Persistent thickened appearance of pituitary stalk probably unchanged compared with previous MRI from 11/01/2016.   11/01/2016 MRI Brain W  and WO: The pituitary stalk is thickened. This could be incidental, but could also occur with inflammatory disorder such as lymphocytic infundibulo-hypophysitis, Langerhans  cell histiocytosis, neurosarcoidosis another inflammatory etiologies. The changes are stable when compared to the MRI dated 07/14/2016 making a neoplastic disorder less likely.   Physical Exam  BP 130/82 (BP Location: Right Arm, Patient Position: Sitting, Cuff Size: Normal)   Pulse 98   Resp (!) 98   Ht 5\' 6"  (1.676 m)   Wt (!) 314 lb 6.4 oz (142.6 kg)   BMI 50.75 kg/m    Constitutional: well developed, well nourished, mild supraclavicular fat, mild dorsocervical hump Head: normocephalic, atraumatic, non cushingoid  Eyes: sclera anicteric, no redness Neck: supple Lungs: normal respiratory effort Neurology: alert and oriented Skin: dry, no appreciable rashes Musculoskeletal: no appreciable defects, no purple color stretch marks Psychiatric: normal mood and affect   Current Medications Patient's Medications  New Prescriptions   SEMAGLUTIDE,0.25 OR 0.5MG /DOS, (OZEMPIC, 0.25 OR 0.5 MG/DOSE,) 2 MG/1.5ML SOPN    Inject 0.25 mg into the skin once a week.  Previous Medications   ALBUTEROL (VENTOLIN HFA) 108 (90 BASE) MCG/ACT INHALER    Inhale 2 puffs into the lungs every 6 (six) hours as needed for wheezing or shortness of breath.   BENZOCAINE-RESORCINOL (VAGISIL EX)    Apply 1 application. topically as needed.   BENZONATATE (TESSALON) 100 MG CAPSULE    Take 1 capsule (100 mg total) by mouth 3 (three) times daily as needed for cough.   BUSPIRONE (BUSPAR) 10 MG TABLET    Take 1 tablet (10 mg total) by mouth 3 (three) times daily.   CHOLESTYRAMINE (QUESTRAN) 4 G PACKET    Take 1 packet twice daily between meals   DICLOFENAC SODIUM (VOLTAREN) 1 % GEL    Apply 2 g topically 4 (four) times daily.   FLUTICASONE (FLONASE) 50 MCG/ACT NASAL SPRAY    Place 2 sprays into both nostrils daily.   GABAPENTIN (NEURONTIN) 300 MG CAPSULE    TAKE 1 CAPSULE (300 MG TOTAL) BY MOUTH AT BEDTIME.   HYDROXYZINE (ATARAX) 25 MG TABLET    Take 1 tablet (25 mg total) by mouth 3 (three) times daily as needed for  anxiety.   IPRATROPIUM-ALBUTEROL (DUONEB) 0.5-2.5 (3) MG/3ML SOLN    Take 3 mLs by nebulization every 4 (four) hours as needed.   LOPERAMIDE (IMODIUM A-D) 2 MG TABLET    Take 2 mg by mouth 4 (four) times daily as needed for diarrhea or loose stools.   NAPROXEN (NAPROSYN) 375 MG TABLET    Take 1 tablet (375 mg total) by mouth 2 (two) times daily with a meal.   OMEPRAZOLE (PRILOSEC) 20 MG CAPSULE    Take 1 capsule (20 mg total) by mouth daily.   PREDNISONE (DELTASONE) 20 MG TABLET    Take 2 tablets (40 mg total) by mouth daily with breakfast.   PROMETHAZINE-DEXTROMETHORPHAN (PROMETHAZINE-DM) 6.25-15 MG/5ML SYRUP    Take 5 mLs by mouth 4 (four) times daily as needed for cough.   PROPRANOLOL (INDERAL) 20 MG TABLET    Take 1 tablet (20 mg total) by mouth 2 (two) times daily. For anxiety   QUETIAPINE (SEROQUEL) 50 MG TABLET    Take 1 tablet (50 mg total) by mouth at bedtime.   SERTRALINE (ZOLOFT) 100 MG TABLET    Take 2 tablets (200 mg total) by mouth at bedtime.   TRAZODONE (DESYREL) 100 MG TABLET    Take 1 tablet (100 mg total) by mouth at  bedtime as needed for sleep.  Modified Medications   No medications on file  Discontinued Medications   No medications on file    Allergies Allergies  Allergen Reactions   Mucinex [Guaifenesin Er] Hives, Itching and Swelling   Penicillins Swelling    Did it involve swelling of the face/tongue/throat, SOB, or low BP? Yes Did it involve sudden or severe rash/hives, skin peeling, or any reaction on the inside of your mouth or nose? No Did you need to seek medical attention at a hospital or doctor's office? Yes When did it last happen?      >10 years ago If all above answers are "NO", may proceed with cephalosporin use.    Contrast Media [Iodinated Contrast Media] Nausea And Vomiting   Latex Hives and Swelling   Multihance [Gadobenate] Nausea And Vomiting   Other     Pickles - throat swelling and nausea     Past Medical History Past Medical History:   Diagnosis Date   Anxiety 2013   treated at [facility] by Deatra Robinson    Asthma 1989    no hospitalization, or intubation, previously on advair and singulair. asthma worsening.    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 , dx in utero    Depression 2001   depressed since childhood    Dysrhythmia 2010   PVC's   Essential hypertension 03/12/2019   GERD (gastroesophageal reflux disease)    History of hiatal hernia    History of suicidal ideation    IBS (irritable bowel syndrome)    Kyphosis of thoracic region 2004    painful, runs in dad side    Migraine    Pancreatitis 2003   gallstone pancreatitis    Pancreatitis due to biliary obstruction 2003   PCOS (polycystic ovarian syndrome) 2014   facial hair, irregular periods, never been pregnant    Sleep apnea    no longer uses cpap    Past Surgical History Past Surgical History:  Procedure Laterality Date   BIOPSY  06/11/2021   Procedure: BIOPSY;  Surgeon: Napoleon Form, MD;  Location: [facility];  Service: Gastroenterology;;   BLADDER SURGERY     CHOLECYSTECTOMY  2003    COLONOSCOPY WITH PROPOFOL N/A 06/11/2021   Procedure: COLONOSCOPY WITH PROPOFOL;  Surgeon: Napoleon Form, MD;  Location: [facility];  Service: Gastroenterology;  Laterality: N/A;   DILATATION & CURETTAGE/HYSTEROSCOPY WITH MYOSURE N/A 12/14/2019   Procedure: DILATATION & CURETTAGE/HYSTEROSCOPY WITH Polypectomy with MYOSURE;  Surgeon: Tereso Newcomer, MD;  Location: [facility];  Service: Gynecology;  Laterality: N/A;   OPEN REDUCTION INTERNAL FIXATION (ORIF) PROXIMAL PHALANX Right 07/23/2013   Procedure: OPEN TREATMENT RIGHT RING FINGER, PROXIMAL INTERPHALANGEAL/JOINT FRACTURE DISLOCATION;  Surgeon: Jodi Marble, MD;  Location: [facility];  Service: Orthopedics;  Laterality: Right;   TONSILLECTOMY     and adenoidectomy    Family History family history includes ADD / ADHD in her cousin; Alcohol abuse in her maternal aunt, maternal  grandfather, paternal aunt, and paternal grandfather; Anxiety disorder in her mother; Arnold-Chiari malformation in her mother; Arthritis in her mother; Asthma in her mother; Bipolar disorder in her father; Bone cancer (age of onset: 58) in her maternal grandfather; COPD in her father and mother; Cancer in her paternal grandmother; Colonic polyp in her mother; Congestive Heart Failure in her father; Dementia in her maternal grandmother and paternal grandfather; Depression in her father, maternal aunt, and paternal aunt; Diabetes in her maternal grandmother and paternal  grandfather; Drug abuse in her maternal grandfather and paternal grandfather; Eczema in her mother; Hypertension in her father, maternal grandmother, and mother; Multiple myeloma in her maternal grandfather; OCD in her maternal grandmother and mother; Osteoporosis in her mother; Pancreatic cancer in her maternal aunt; Peripheral Artery Disease in her mother; Restless legs syndrome in her mother; Scoliosis in her father; Squamous cell carcinoma in her mother.  Social History Social History   Socioeconomic History   Marital status: Single    Spouse name: Not on file   Number of children: 0   Years of education: college    Highest education level: Not on file  Occupational History   Occupation: Unemployed   Tobacco Use   Smoking status: Never   Smokeless tobacco: Never  Vaping Use   Vaping Use: Never used  Substance and Sexual Activity   Alcohol use: No   Drug use: No   Sexual activity: Yes    Birth control/protection: None  Other Topics Concern   Not on file  Social History Narrative   Lives with mom Kristen [name])    Right-handed   Caffeine: 3 cups of coffee per day   Social Determinants of Health   Financial Resource Strain: Not on file  Food Insecurity: Food Insecurity Present (08/17/2020)   Hunger Vital Sign    Worried About Running Out of Food in the Last Year: Often true    Ran Out of Food in the Last Year: Sometimes  true  Transportation Needs: No Transportation Needs (08/17/2020)   PRAPARE - Administrator, Civil Service (Medical): No    Lack of Transportation (Non-Medical): No  Physical Activity: Not on file  Stress: Not on file  Social Connections: Not on file  Intimate Partner Violence: Not on file   Component     Latest Ref Rng 06/19/2022  Sodium     135 - 145 mEq/L 139   Potassium     3.5 - 5.1 mEq/L 3.5   Chloride     96 - 112 mEq/L 100   CO2     19 - 32 mEq/L 30   Glucose     70 - 99 mg/dL 161 (H)   BUN     6 - 23 mg/dL 12   Creatinine     0.96 - 1.20 mg/dL 0.45   GFR     >40.98 mL/min 69.70   Calcium     8.4 - 10.5 mg/dL 9.5   LH     mIU/mL 1.19   Prolactin     ng/mL 10.7   Estradiol     pg/mL 69   FSH     mIU/ML 5.4   Cortisol, Plasma     ug/dL 14.7     Component Ref Range & Units 1 d ago (06/19/22) 1 yr ago (11/01/20) 1 yr ago (07/14/20) 5 yr ago (09/18/16) 6 yr ago (06/20/16) 6 yr ago (05/24/16)  Prolactin ng/mL 10.7 15.3 CM 20.5 R 15.9 CM 32.9 High  R 27.6 High  R  Comment:             Reference Range    Lab Results  Component Value Date   CHOL 177 06/18/2022   Lab Results  Component Value Date   HDL 42.40 06/18/2022   Lab Results  Component Value Date   LDLCALC 109 (H) 06/18/2022   Lab Results  Component Value Date   TRIG 128.0 06/18/2022   Lab Results  Component Value Date   CHOLHDL  4 06/18/2022   Lab Results  Component Value Date   CREATININE 1.04 06/19/2022   Lab Results  Component Value Date   GFR 69.70 06/19/2022      Component Value Date/Time   NA 139 06/19/2022 0814   NA 141 07/14/2020 0937   K 3.5 06/19/2022 0814   CL 100 06/19/2022 0814   CO2 30 06/19/2022 0814   GLUCOSE 106 (H) 06/19/2022 0814   BUN 12 06/19/2022 0814   BUN 12 07/14/2020 0937   CREATININE 1.04 06/19/2022 0814   CREATININE 1.09 03/25/2014 1730   CALCIUM 9.5 06/19/2022 0814   PROT 6.5 06/18/2022 1025   PROT 6.4 07/22/2017 1339   ALBUMIN 3.7  06/18/2022 1025   ALBUMIN 4.2 07/22/2017 1339   AST 14 06/18/2022 1025   ALT 13 06/18/2022 1025   ALKPHOS 110 06/18/2022 1025   BILITOT 0.6 06/18/2022 1025   BILITOT 0.4 07/22/2017 1339   GFRNONAA >60 01/07/2021 0723   GFRNONAA 70 03/25/2014 1730   GFRAA >60 04/15/2019 0624   GFRAA 80 03/25/2014 1730      Latest Ref Rng & Units 06/19/2022    8:14 AM 06/18/2022   10:25 AM 06/11/2021    8:58 AM  BMP  Glucose 70 - 99 mg/dL 540  85  95   BUN 6 - 23 mg/dL 12  12  7    Creatinine 0.40 - 1.20 mg/dL 9.81  1.91  4.78   Sodium 135 - 145 mEq/L 139  140  140   Potassium 3.5 - 5.1 mEq/L 3.5  3.3  4.4   Chloride 96 - 112 mEq/L 100  101  104   CO2 19 - 32 mEq/L 30  30    Calcium 8.4 - 10.5 mg/dL 9.5  8.9         Component Value Date/Time   WBC 9.3 01/07/2021 0723   RBC 4.89 01/07/2021 0723   HGB 15.3 (H) 06/11/2021 0858   HGB 13.0 11/15/2020 1131   HGB 14.5 07/14/2020 0937   HCT 45.0 06/11/2021 0858   HCT 44.1 07/14/2020 0937   PLT 292 01/07/2021 0723   PLT 232 11/15/2020 1131   PLT 237 07/14/2020 0937   MCV 87.5 01/07/2021 0723   MCV 87 07/14/2020 0937   MCH 27.6 01/07/2021 0723   MCHC 31.5 01/07/2021 0723   RDW 13.5 01/07/2021 0723   RDW 13.0 07/14/2020 0937   LYMPHSABS 3.9 01/07/2021 0723   LYMPHSABS 3.3 (H) 07/22/2017 1339   MONOABS 0.4 01/07/2021 0723   EOSABS 0.5 01/07/2021 0723   EOSABS 0.5 (H) 07/22/2017 1339   BASOSABS 0.1 01/07/2021 0723   BASOSABS 0.0 07/22/2017 1339   Lab Results  Component Value Date   TSH 3.01 06/18/2022   TSH 2.99 11/01/2020   TSH 4.628 (H) 04/15/2019   FREET4 1.05 06/18/2022   FREET4 0.84 11/01/2020   FREET4 1.07 09/18/2016         Parts of this note may have been dictated using voice recognition software. There may be variances in spelling and vocabulary which are unintentional. Not all errors are proofread. Please notify the Thereasa Parkin if any discrepancies are noted or if the meaning of any statement is not clear.

## 2022-06-21 ENCOUNTER — Other Ambulatory Visit: Payer: Self-pay

## 2022-06-21 LAB — ESTRADIOL: Estradiol: 69 pg/mL

## 2022-06-25 ENCOUNTER — Other Ambulatory Visit: Payer: Self-pay

## 2022-06-25 LAB — PROLACTIN: Prolactin: 10.7 ng/mL

## 2022-06-25 LAB — INSULIN-LIKE GROWTH FACTOR
IGF-I, LC/MS: 123 ng/mL (ref 53–331)
Z-Score (Female): -0.3 SD (ref ?–2.0)

## 2022-06-25 LAB — GROWTH HORMONE: Growth Hormone: <0.1 ng/mL (ref <=7.1)

## 2022-06-25 LAB — ACTH: C206 ACTH: 28 pg/mL (ref 6–50)

## 2022-06-26 ENCOUNTER — Other Ambulatory Visit: Payer: Self-pay

## 2022-06-27 ENCOUNTER — Other Ambulatory Visit: Payer: Self-pay

## 2022-06-27 ENCOUNTER — Encounter: Payer: Self-pay | Admitting: Physician Assistant

## 2022-06-27 ENCOUNTER — Ambulatory Visit: Payer: Medicaid Other | Attending: Physician Assistant | Admitting: Physician Assistant

## 2022-06-27 VITALS — BP 135/86 | HR 85 | Ht 66.0 in | Wt 315.2 lb

## 2022-06-27 DIAGNOSIS — D229 Melanocytic nevi, unspecified: Secondary | ICD-10-CM | POA: Insufficient documentation

## 2022-06-27 DIAGNOSIS — N921 Excessive and frequent menstruation with irregular cycle: Secondary | ICD-10-CM | POA: Insufficient documentation

## 2022-06-27 DIAGNOSIS — J4520 Mild intermittent asthma, uncomplicated: Secondary | ICD-10-CM | POA: Diagnosis not present

## 2022-06-27 DIAGNOSIS — N393 Stress incontinence (female) (male): Secondary | ICD-10-CM | POA: Diagnosis not present

## 2022-06-27 DIAGNOSIS — L723 Sebaceous cyst: Secondary | ICD-10-CM | POA: Insufficient documentation

## 2022-06-27 DIAGNOSIS — N938 Other specified abnormal uterine and vaginal bleeding: Secondary | ICD-10-CM | POA: Insufficient documentation

## 2022-06-27 MED ORDER — ALBUTEROL SULFATE HFA 108 (90 BASE) MCG/ACT IN AERS
2.0000 | INHALATION_SPRAY | Freq: Four times a day (QID) | RESPIRATORY_TRACT | 1 refills | Status: DC | PRN
Start: 2022-06-27 — End: 2023-04-30
  Filled 2022-06-27: qty 18, 25d supply, fill #0

## 2022-06-27 MED ORDER — MUPIROCIN 2 % EX OINT
1.0000 | TOPICAL_OINTMENT | Freq: Two times a day (BID) | CUTANEOUS | 0 refills | Status: DC
Start: 2022-06-27 — End: 2023-01-17
  Filled 2022-06-27: qty 22, 11d supply, fill #0

## 2022-06-27 NOTE — Progress Notes (Signed)
Patient ID: Kristen [name], female   DOB: 1985-07-05, 37 y.o.   MRN: [id number]   Kristen [name], is a 37 y.o. female  [id number]  [id number]  DOB - 22-Mar-1985  Chief Complaint  Patient presents with   Referral       Subjective:   Kristen [name] is a 37 y.o. female here today for multiple issues.  She wants to see about getting "sterilized" or hysterectomy bc of how heavy her periods are and erratic they are.  PICA with ice.  Not eating sand/dirt/flour   She is seen by endocrinology for insulin resistance and lymphocytic hypophysitis.  Awaiting ozempic PA.   Also, she is fair-skinned and concerned about skin CA and is always having new moles appear.  She also has some sebaceous cysts that get inflamed and infected at times.    Stress incontinence that has been going on a while.  She is nulliparous.    Needs RF on inhaler  No problems updated.  ALLERGIES: Allergies  Allergen Reactions   Mucinex [Guaifenesin Er] Hives, Itching and Swelling   Penicillins Swelling    Did it involve swelling of the face/tongue/throat, SOB, or low BP? Yes Did it involve sudden or severe rash/hives, skin peeling, or any reaction on the inside of your mouth or nose? No Did you need to seek medical attention at a hospital or doctor's office? Yes When did it last happen?      >10 years ago If all above answers are "NO", may proceed with cephalosporin use.    Contrast Media [Iodinated Contrast Media] Nausea And Vomiting   Latex Hives and Swelling   Multihance [Gadobenate] Nausea And Vomiting   Other     Pickles - throat swelling and nausea     PAST MEDICAL HISTORY: Past Medical History:  Diagnosis Date   Anxiety 2013   treated at [facility] by Deatra Robinson    Asthma 1989    no hospitalization, or intubation, previously on advair and singulair. asthma worsening.    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 , dx in utero    Depression 2001   depressed since childhood     Dysrhythmia 2010   PVC's   Essential hypertension 03/12/2019   GERD (gastroesophageal reflux disease)    History of hiatal hernia    History of suicidal ideation    IBS (irritable bowel syndrome)    Kyphosis of thoracic region 2004    painful, runs in dad side    Migraine    Pancreatitis 2003   gallstone pancreatitis    Pancreatitis due to biliary obstruction 2003   PCOS (polycystic ovarian syndrome) 2014   facial hair, irregular periods, never been pregnant    Sleep apnea    no longer uses cpap    MEDICATIONS AT HOME: Prior to Admission medications   Medication Sig Start Date End Date Taking? Authorizing Provider  Benzocaine-Resorcinol (VAGISIL EX) Apply 1 application. topically as needed.   Yes [provider]  benzonatate (TESSALON) 100 MG capsule Take 1 capsule (100 mg total) by mouth 3 (three) times daily as needed for cough. 06/11/22  Yes Waldon Merl, PA-C  busPIRone (BUSPAR) 10 MG tablet Take 1 tablet (10 mg total) by mouth 3 (three) times daily. 06/12/22  Yes Shanna Cisco, NP  cholestyramine Lanetta Inch) 4 g packet Take 1 packet twice daily between meals 07/11/21  Yes Esterwood, [name] S, PA-C  diclofenac Sodium (VOLTAREN) 1 % GEL Apply 2 g topically 4 (four)  times daily. Patient taking differently: Apply 2 g topically as needed. 06/21/21  Yes Marcine Matar, MD  fluticasone (FLONASE) 50 MCG/ACT nasal spray Place 2 sprays into both nostrils daily. 06/11/22  Yes Waldon Merl, PA-C  hydrOXYzine (ATARAX) 25 MG tablet Take 1 tablet (25 mg total) by mouth 3 (three) times daily as needed for anxiety. 06/12/22  Yes Toy Cookey E, NP  ipratropium-albuterol (DUONEB) 0.5-2.5 (3) MG/3ML SOLN Take 3 mLs by nebulization every 4 (four) hours as needed.   Yes [provider]  loperamide (IMODIUM A-D) 2 MG tablet Take 2 mg by mouth 4 (four) times daily as needed for diarrhea or loose stools.   Yes [provider]  mupirocin ointment (BACTROBAN) 2 % Apply 1  Application topically 2 (two) times daily. 06/27/22  Yes Anders Simmonds, PA-C  naproxen (NAPROSYN) 375 MG tablet Take 1 tablet (375 mg total) by mouth 2 (two) times daily with a meal. 06/15/21  Yes Palumbo, April, MD  omeprazole (PRILOSEC) 20 MG capsule Take 1 capsule (20 mg total) by mouth daily. 07/11/20  Yes Marcine Matar, MD  predniSONE (DELTASONE) 20 MG tablet Take 2 tablets (40 mg total) by mouth daily with breakfast. 06/11/22  Yes Waldon Merl, PA-C  promethazine-dextromethorphan (PROMETHAZINE-DM) 6.25-15 MG/5ML syrup Take 5 mLs by mouth 4 (four) times daily as needed for cough. 06/11/22  Yes Waldon Merl, PA-C  propranolol (INDERAL) 20 MG tablet Take 1 tablet (20 mg total) by mouth 2 (two) times daily. For anxiety 05/01/21  Yes Penn, Cranston Neighbor, NP  QUEtiapine (SEROQUEL) 50 MG tablet Take 1 tablet (50 mg total) by mouth at bedtime. 06/12/22  Yes Toy Cookey E, NP  Semaglutide,0.25 or 0.5MG /DOS, (OZEMPIC, 0.25 OR 0.5 MG/DOSE,) 2 MG/3ML SOPN Inject 0.25 mg into the skin once a week. 06/20/22  Yes Motwani, Komal, MD  sertraline (ZOLOFT) 100 MG tablet Take 2 tablets (200 mg total) by mouth at bedtime. 06/12/22  Yes Toy Cookey E, NP  traZODone (DESYREL) 100 MG tablet Take 1 tablet (100 mg total) by mouth at bedtime as needed for sleep. 06/12/22  Yes Shanna Cisco, NP  albuterol (VENTOLIN HFA) 108 (90 Base) MCG/ACT inhaler Inhale 2 puffs into the lungs every 6 (six) hours as needed for wheezing or shortness of breath. 06/27/22   Bronte Kropf, Marzella Schlein, PA-C  gabapentin (NEURONTIN) 300 MG capsule TAKE 1 CAPSULE (300 MG TOTAL) BY MOUTH AT BEDTIME. Patient taking differently: Take 300 mg by mouth as needed (pain). 03/24/20 07/12/21  Marcine Matar, MD    ROS: Neg HEENT Neg resp Neg cardiac Neg GI Neg GU Neg MS Neg psych Neg neuro  Objective:   Vitals:   06/27/22 1403  BP: 135/86  Pulse: 85  SpO2: 96%  Weight: (!) 315 lb 3.2 oz (143 kg)  Height: 5\' 6"  (1.676 m)    Exam General appearance : Awake, alert, not in any distress. Speech Clear. Not toxic looking HEENT: Atraumatic and Normocephalic Neck: Supple, no JVD. No cervical lymphadenopathy.  Chest: Good air entry bilaterally, CTAB.  No rales/rhonchi/wheezing CVS: S1 S2 regular, no murmurs.  Extremities: B/L Lower Ext shows no edema, both legs are warm to touch Neurology: Awake alert, and oriented X 3, CN II-XII intact, Non focal Skin: No Rash;  she has various strawberry hemangioma, AC, and a couple sebaceous cysts on her back/hip/face.  Nothing with irregular borders of immediate concern  Data Review Lab Results  Component Value Date   HGBA1C 5.2 06/18/2022  HGBA1C 4.9 04/15/2019   HGBA1C 5.0 04/09/2016    Assessment & Plan   1. Sebaceous cyst - mupirocin ointment (BACTROBAN) 2 %; Apply 1 Application topically 2 (two) times daily.  Dispense: 22 g; Refill: 0  2. Change in mole - Ambulatory referral to Dermatology  3. Menorrhagia with irregular cycle - CBC with Differential/Platelet - Iron, TIBC and Ferritin Panel - Ambulatory referral to Gynecology  4. Stress incontinence - Ambulatory referral to Urology  5. DUB (dysfunctional uterine bleeding) - CBC with Differential/Platelet - Iron, TIBC and Ferritin Panel - Ambulatory referral to Gynecology  6. Mild intermittent asthma without complication - albuterol (VENTOLIN HFA) 108 (90 Base) MCG/ACT inhaler; Inhale 2 puffs into the lungs every 6 (six) hours as needed for wheezing or shortness of breath.  Dispense: 18 g; Refill: 1    Return in about 6 months (around 12/28/2022) for PCP for chronic conditions.  The patient was given clear instructions to go to ER or return to medical center if symptoms don't improve, worsen or new problems develop. The patient verbalized understanding. The patient was told to call to get lab results if they haven't heard anything in the next week.      Georgian Co, PA-C [facility] [place], [place] [phone]   06/27/2022, 2:22 PM

## 2022-06-28 ENCOUNTER — Other Ambulatory Visit: Payer: Self-pay

## 2022-06-28 LAB — CBC WITH DIFFERENTIAL/PLATELET
Basophils Absolute: 0.1 10*3/uL (ref 0.0–0.2)
Basos: 1 %
EOS (ABSOLUTE): 0.2 10*3/uL (ref 0.0–0.4)
Eos: 2 %
Hematocrit: 39.9 % (ref 34.0–46.6)
Hemoglobin: 13.3 g/dL (ref 11.1–15.9)
Immature Grans (Abs): 0 10*3/uL (ref 0.0–0.1)
Immature Granulocytes: 0 %
Lymphocytes Absolute: 3 10*3/uL (ref 0.7–3.1)
Lymphs: 31 %
MCH: 28.2 pg (ref 26.6–33.0)
MCHC: 33.3 g/dL (ref 31.5–35.7)
MCV: 85 fL (ref 79–97)
Monocytes Absolute: 0.4 10*3/uL (ref 0.1–0.9)
Monocytes: 5 %
Neutrophils Absolute: 5.8 10*3/uL (ref 1.4–7.0)
Neutrophils: 61 %
Platelets: 278 10*3/uL (ref 150–450)
RBC: 4.72 x10E6/uL (ref 3.77–5.28)
RDW: 13.5 % (ref 11.7–15.4)
WBC: 9.5 10*3/uL (ref 3.4–10.8)

## 2022-06-28 LAB — IRON,TIBC AND FERRITIN PANEL
Ferritin: 40 ng/mL (ref 15–150)
Iron Saturation: 16 % (ref 15–55)
Iron: 53 ug/dL (ref 27–159)
Total Iron Binding Capacity: 339 ug/dL (ref 250–450)
UIBC: 286 ug/dL (ref 131–425)

## 2022-07-02 ENCOUNTER — Other Ambulatory Visit: Payer: Self-pay

## 2022-07-05 ENCOUNTER — Other Ambulatory Visit: Payer: Self-pay

## 2022-07-06 ENCOUNTER — Encounter: Payer: Self-pay | Admitting: "Endocrinology
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
## 2022-07-08 ENCOUNTER — Ambulatory Visit: Payer: Medicaid Other | Attending: Nurse Practitioner | Admitting: Nurse Practitioner

## 2022-07-08 ENCOUNTER — Encounter: Payer: Self-pay | Admitting: Nurse Practitioner

## 2022-07-08 ENCOUNTER — Other Ambulatory Visit: Payer: Self-pay

## 2022-07-08 VITALS — BP 138/98 | HR 85 | Ht 66.0 in | Wt 318.0 lb

## 2022-07-08 DIAGNOSIS — I951 Orthostatic hypotension: Secondary | ICD-10-CM | POA: Diagnosis present

## 2022-07-08 DIAGNOSIS — R55 Syncope and collapse: Secondary | ICD-10-CM | POA: Diagnosis present

## 2022-07-08 DIAGNOSIS — R030 Elevated blood-pressure reading, without diagnosis of hypertension: Secondary | ICD-10-CM | POA: Diagnosis present

## 2022-07-08 DIAGNOSIS — Q796 Ehlers-Danlos syndrome, unspecified: Secondary | ICD-10-CM | POA: Diagnosis present

## 2022-07-08 DIAGNOSIS — F32A Depression, unspecified: Secondary | ICD-10-CM

## 2022-07-08 DIAGNOSIS — R002 Palpitations: Secondary | ICD-10-CM

## 2022-07-08 DIAGNOSIS — F419 Anxiety disorder, unspecified: Secondary | ICD-10-CM

## 2022-07-08 MED ORDER — METOPROLOL TARTRATE 25 MG PO TABS
25.0000 mg | ORAL_TABLET | Freq: Two times a day (BID) | ORAL | 3 refills | Status: DC
  Filled 2022-07-08: qty 180, 90d supply, fill #0

## 2022-07-08 MED ORDER — METOPROLOL TARTRATE 25 MG PO TABS: 25.0000 mg | ORAL_TABLET | Freq: Two times a day (BID) | ORAL | 3 refills | Status: DC

## 2022-07-08 MED ORDER — PROPRANOLOL HCL 10 MG PO TABS
20.0000 mg | ORAL_TABLET | Freq: Three times a day (TID) | ORAL | 11 refills | Status: DC | PRN
Start: 2022-07-08 — End: 2023-04-30
  Filled 2022-07-08: qty 60, 10d supply, fill #0

## 2022-07-08 NOTE — Patient Instructions (Signed)
Medication Instructions:   START Lopressor one (1) tablet by mouth ( 25 mg) twice daily.  CHANGE Propranolol one (1) tablet by by mouth ( 10 mg ) as need three times daily for palpitations.   *If you need a refill on your cardiac medications before your next appointment, please call your pharmacy*   Lab Work:  None ordered.  If you have labs (blood work) drawn today and your tests are completely normal, you will receive your results only by: MyChart Message (if you have MyChart) OR A paper copy in the mail If you have any lab test that is abnormal or we need to change your treatment, we will call you to review the results.   Testing/Procedures:  None ordered.    Follow-Up: At [facility], you and your health needs are our priority.  As part of our continuing mission to provide you with exceptional heart care, we have created designated Provider Care Teams.  These Care Teams include your primary Cardiologist (physician) and Advanced Practice Providers (APPs -  Physician Assistants and Nurse Practitioners) who all work together to provide you with the care you need, when you need it.  We recommend signing up for the patient portal called "MyChart".  Sign up information is provided on this After Visit Summary.  MyChart is used to connect with patients for Virtual Visits (Telemedicine).  Patients are able to view lab/test results, encounter notes, upcoming appointments, etc.  Non-urgent messages can be sent to your provider as well.   To learn more about what you can do with MyChart, go to [email or web address].    Your next appointment:   1 year(s)  Provider:   Eligha Bridegroom, NP         Other Instructions   Your physician wants you to follow-up in: 1 year with Kristen [name].  You will receive a reminder letter in the mail two months in advance. If you don't receive a letter, please call our office to schedule the follow-up appointment.

## 2022-07-15 ENCOUNTER — Other Ambulatory Visit: Payer: Self-pay

## 2022-07-16 ENCOUNTER — Other Ambulatory Visit: Payer: Self-pay

## 2022-07-25 ENCOUNTER — Other Ambulatory Visit (HOSPITAL_COMMUNITY): Payer: Self-pay

## 2022-07-25 ENCOUNTER — Other Ambulatory Visit: Payer: Self-pay

## 2022-07-25 ENCOUNTER — Encounter: Payer: Self-pay | Admitting: Physician Assistant

## 2022-07-25 ENCOUNTER — Other Ambulatory Visit: Payer: Self-pay | Admitting: "Endocrinology

## 2022-07-25 MED ORDER — ZEPBOUND 2.5 MG/0.5ML ~~LOC~~ SOAJ
2.5000 mg | SUBCUTANEOUS | 0 refills | Status: AC
  Filled 2022-07-25: qty 2, 28d supply, fill #0

## 2022-07-31 ENCOUNTER — Other Ambulatory Visit: Payer: Self-pay

## 2022-08-09 ENCOUNTER — Other Ambulatory Visit: Payer: Self-pay

## 2022-08-12 ENCOUNTER — Other Ambulatory Visit (HOSPITAL_COMMUNITY): Payer: Self-pay

## 2022-08-12 ENCOUNTER — Telehealth: Payer: Self-pay

## 2022-08-12 NOTE — Telephone Encounter (Signed)
Patient Advocate Encounter   Received notification from [facility] that prior authorization is required for Zepbound  Submitted: 08/12/22 Key [id number]  Status is pending

## 2022-08-14 NOTE — Telephone Encounter (Signed)
Pharmacy Patient Advocate Encounter  Received notification that the request for prior authorization for Zepbound has been denied due to  Drugs used for weight loss are not a covered pharmacy benefit.

## 2022-08-14 NOTE — Telephone Encounter (Signed)
Patient is aware 

## 2022-08-21 ENCOUNTER — Other Ambulatory Visit: Payer: Self-pay

## 2022-08-21 ENCOUNTER — Encounter (HOSPITAL_COMMUNITY): Payer: Self-pay | Admitting: Psychiatry

## 2022-08-21 ENCOUNTER — Telehealth (HOSPITAL_COMMUNITY): Payer: MEDICAID | Admitting: Psychiatry

## 2022-08-21 DIAGNOSIS — F4010 Social phobia, unspecified: Secondary | ICD-10-CM | POA: Diagnosis not present

## 2022-08-21 DIAGNOSIS — F333 Major depressive disorder, recurrent, severe with psychotic symptoms: Secondary | ICD-10-CM

## 2022-08-21 MED ORDER — QUETIAPINE FUMARATE 50 MG PO TABS
50.0000 mg | ORAL_TABLET | Freq: Every day | ORAL | 3 refills | Status: DC
Start: 2022-08-21 — End: 2022-11-20
  Filled 2022-08-21 – 2022-09-27 (×2): qty 30, 30d supply, fill #0

## 2022-08-21 MED ORDER — SERTRALINE HCL 100 MG PO TABS
200.0000 mg | ORAL_TABLET | Freq: Every day | ORAL | 3 refills | Status: DC
Start: 2022-08-21 — End: 2022-11-20
  Filled 2022-08-21 – 2022-09-27 (×2): qty 60, 30d supply, fill #0

## 2022-08-21 MED ORDER — TRAZODONE HCL 100 MG PO TABS
100.0000 mg | ORAL_TABLET | Freq: Every evening | ORAL | 3 refills | Status: DC | PRN
  Filled 2022-08-21 – 2022-09-27 (×2): qty 30, 30d supply, fill #0

## 2022-08-21 MED ORDER — HYDROXYZINE HCL 25 MG PO TABS
25.0000 mg | ORAL_TABLET | Freq: Four times a day (QID) | ORAL | 3 refills | Status: DC
Start: 2022-08-21 — End: 2022-11-20
  Filled 2022-08-21 – 2022-09-27 (×2): qty 120, 30d supply, fill #0

## 2022-08-21 MED ORDER — BUSPIRONE HCL 10 MG PO TABS
10.0000 mg | ORAL_TABLET | Freq: Three times a day (TID) | ORAL | 3 refills | Status: DC
Start: 2022-08-21 — End: 2022-11-20
  Filled 2022-08-21 – 2022-09-27 (×2): qty 90, 30d supply, fill #0

## 2022-08-21 NOTE — Progress Notes (Signed)
BH MD/PA/NP OP Progress Note Virtual Visit via Video Note  I connected with Kristen [name] on 08/21/22 at 11:30 AM EDT by a video enabled telemedicine application and verified that I am speaking with the correct person using two identifiers.  Location: Patient: Home Provider: Clinic   I discussed the limitations of evaluation and management by telemedicine and the availability of in person appointments. The patient expressed understanding and agreed to proceed.  I provided 30 minutes of non-face-to-face time during this encounter.    08/21/2022 11:44 AM Kristen [name]  MRN:  [id number]  Chief Complaint: "My anxiety has been giving me some problems"  HPI:  37 year old female seen today for follow up psychiatric evaluation.  She has a psychiatric history of social phobia, ADD, anxiety, depression, and PTSD.  She is currently being managed on Seroquel  50 mg nightly, Buspar 10 mg three times daily,  Zoloft 200 mg daily, trazodone 50 mg nightly as needed, hydroxyzine 25 mg 3 times a day as needed, gabapentin 300 mg nightly (prescribed by PCP), and propanolol 20 mg 2 times daily (recives from PCP).  She notes that her medications are effective in managing her psychiatric conditions.   Today patient was well-groomed, pleasant, cooperative, engaged in conversation, and maintained eye contact.  She informed provider that her anxiety has been giving her problems. She notes that she worries about where she is in life and fears that things will note get better. Patient notes that she has also been having family issues surrounding politics. She reports that she sees herself as liberal and her family the opposite. Patient notes that her IBS and restless legs have been problematic.   Patient reports that the above exacerbates her anxiety and depression.  Today provider conducted a GAD 7  and patient scored an 16, at her last visit she scored an 8.  Provider also conducted PHQ-9 and patient  scored a 12, at her last visit she scored a 12.  She endorses adequate appetite. She notes that at times her sleep fluctuates. She reports that she no longer has her CPAP due to her insurance coverage. She now notes that she sleeps 13 hours but feels tired daily.  Today she denies SI/HI/VAH, mania, or paranoia.  Today hydroxyzine 25 mg 3 times daily increased to 4 times daily to help manage anxiety.  She will continue medications as prescribed.  No other concerns noted at this time.      Visit Diagnosis:    ICD-10-CM   1. Social phobia  F40.10 hydrOXYzine (ATARAX) 25 MG tablet    busPIRone (BUSPAR) 10 MG tablet    traZODone (DESYREL) 100 MG tablet    sertraline (ZOLOFT) 100 MG tablet    2. Severe episode of recurrent major depressive disorder, with psychotic features (HCC)  F33.3 busPIRone (BUSPAR) 10 MG tablet    traZODone (DESYREL) 100 MG tablet    sertraline (ZOLOFT) 100 MG tablet    QUEtiapine (SEROQUEL) 50 MG tablet       Past Psychiatric History: social phobia, ADD, anxiety, depression, and PTSD.  Past Medical History:  Past Medical History:  Diagnosis Date   Anxiety 2013   treated at [facility] by Deatra Robinson    Asthma 1989    no hospitalization, or intubation, previously on advair and singulair. asthma worsening.    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 , dx in utero    Depression 2001   depressed since childhood    Dysrhythmia  2010   PVC's   Essential hypertension 03/12/2019   GERD (gastroesophageal reflux disease)    History of hiatal hernia    History of suicidal ideation    IBS (irritable bowel syndrome)    Kyphosis of thoracic region 2004    painful, runs in dad side    Migraine    Pancreatitis 2003   gallstone pancreatitis    Pancreatitis due to biliary obstruction 2003   PCOS (polycystic ovarian syndrome) 2014   facial hair, irregular periods, never been pregnant    Sleep apnea    no longer uses cpap    Past Surgical History:  Procedure Laterality  Date   BIOPSY  06/11/2021   Procedure: BIOPSY;  Surgeon: Napoleon Form, MD;  Location: [facility];  Service: Gastroenterology;;   BLADDER SURGERY     CHOLECYSTECTOMY  2003    COLONOSCOPY WITH PROPOFOL N/A 06/11/2021   Procedure: COLONOSCOPY WITH PROPOFOL;  Surgeon: Napoleon Form, MD;  Location: [facility];  Service: Gastroenterology;  Laterality: N/A;   DILATATION & CURETTAGE/HYSTEROSCOPY WITH MYOSURE N/A 12/14/2019   Procedure: DILATATION & CURETTAGE/HYSTEROSCOPY WITH Polypectomy with MYOSURE;  Surgeon: Tereso Newcomer, MD;  [facility];  Service: Gynecology;  Laterality: N/A;   OPEN REDUCTION INTERNAL FIXATION (ORIF) PROXIMAL PHALANX Right 07/23/2013   Procedure: OPEN TREATMENT RIGHT RING FINGER, PROXIMAL INTERPHALANGEAL/JOINT FRACTURE DISLOCATION;  Surgeon: Jodi Marble, MD;  Location: [facility];  Service: Orthopedics;  Laterality: Right;   TONSILLECTOMY     and adenoidectomy    Family Psychiatric History: Mother PTSD, Father Bipolar disorder and PTSD  Family History:  Family History  Problem Relation Age of Onset   Hypertension Mother    Arnold-Chiari malformation Mother    Restless legs syndrome Mother    Osteoporosis Mother    COPD Mother    Asthma Mother    Squamous cell carcinoma Mother        skin    Eczema Mother    Arthritis Mother    Peripheral Artery Disease Mother    Anxiety disorder Mother    OCD Mother    Colonic polyp Mother    Hypertension Father    Scoliosis Father    Bipolar disorder Father    Congestive Heart Failure Father    COPD Father    Depression Father    Diabetes Maternal Grandmother    Hypertension Maternal Grandmother    Dementia Maternal Grandmother    OCD Maternal Grandmother    Bone cancer Maternal Grandfather 37       bone marrow    Multiple myeloma Maternal Grandfather    Alcohol abuse Maternal Grandfather    Drug abuse Maternal Grandfather    Cancer Paternal Grandmother        colon   Diabetes  Paternal Grandfather    Alcohol abuse Paternal Grandfather    Drug abuse Paternal Grandfather    Dementia Paternal Grandfather    Alcohol abuse Maternal Aunt    Depression Maternal Aunt    Pancreatic cancer Maternal Aunt    Alcohol abuse Paternal Aunt    Depression Paternal Aunt    ADD / ADHD Cousin     Social History:  Social History   Socioeconomic History   Marital status: Single    Spouse name: Not on file   Number of children: 0   Years of education: college    Highest education level: Not on file  Occupational History   Occupation: Unemployed   Tobacco Use  Smoking status: Never   Smokeless tobacco: Never  Vaping Use   Vaping status: Never Used  Substance and Sexual Activity   Alcohol use: No   Drug use: No   Sexual activity: Yes    Birth control/protection: None  Other Topics Concern   Not on file  Social History Narrative   Lives with mom Steward Drone)    Right-handed   Caffeine: 3 cups of coffee per day   Social Determinants of Health   Financial Resource Strain: Not on file  Food Insecurity: Food Insecurity Present (08/17/2020)   Hunger Vital Sign    Worried About Running Out of Food in the Last Year: Often true    Ran Out of Food in the Last Year: Sometimes true  Transportation Needs: No Transportation Needs (08/17/2020)   PRAPARE - Administrator, Civil Service (Medical): No    Lack of Transportation (Non-Medical): No  Physical Activity: Not on file  Stress: Not on file  Social Connections: Not on file    Allergies:  Allergies  Allergen Reactions   Mucinex [Guaifenesin Er] Hives, Itching and Swelling   Penicillins Swelling    Did it involve swelling of the face/tongue/throat, SOB, or low BP? Yes Did it involve sudden or severe rash/hives, skin peeling, or any reaction on the inside of your mouth or nose? No Did you need to seek medical attention at a hospital or doctor's office? Yes When did it last happen?      >10 years ago If all  above answers are "NO", may proceed with cephalosporin use.    Contrast Media [Iodinated Contrast Media] Nausea And Vomiting   Latex Hives and Swelling   Multihance [Gadobenate] Nausea And Vomiting   Other     Pickles - throat swelling and nausea     Metabolic Disorder Labs: Lab Results  Component Value Date   HGBA1C 5.2 06/18/2022   MPG 93.93 04/15/2019   Lab Results  Component Value Date   PROLACTIN 10.7 06/19/2022   PROLACTIN 15.3 11/01/2020   Lab Results  Component Value Date   CHOL 177 06/18/2022   TRIG 128.0 06/18/2022   HDL 42.40 06/18/2022   CHOLHDL 4 06/18/2022   VLDL 25.6 06/18/2022   LDLCALC 109 (H) 06/18/2022   LDLCALC 134 (H) 04/15/2019   Lab Results  Component Value Date   TSH 3.01 06/18/2022   TSH 2.99 11/01/2020    Therapeutic Level Labs: No results found for: "LITHIUM" No results found for: "VALPROATE" No results found for: "CBMZ"  Current Medications: Current Outpatient Medications  Medication Sig Dispense Refill   albuterol (VENTOLIN HFA) 108 (90 Base) MCG/ACT inhaler Inhale 2 puffs into the lungs every 6 (six) hours as needed for wheezing or shortness of breath. 18 g 1   Benzocaine-Resorcinol (VAGISIL EX) Apply 1 application. topically as needed.     busPIRone (BUSPAR) 10 MG tablet Take 1 tablet (10 mg total) by mouth 3 (three) times daily. 90 tablet 3   cholestyramine (QUESTRAN) 4 g packet Take 1 packet twice daily between meals 60 each 6   diclofenac Sodium (VOLTAREN) 1 % GEL Apply 2 g topically 4 (four) times daily. (Patient taking differently: Apply 2 g topically as needed.) 100 g 0   fluticasone (FLONASE) 50 MCG/ACT nasal spray Place 2 sprays into both nostrils daily. 16 g 0   gabapentin (NEURONTIN) 300 MG capsule TAKE 1 CAPSULE (300 MG TOTAL) BY MOUTH AT BEDTIME. (Patient taking differently: Take 300 mg by mouth as needed (  pain).) 30 capsule 3   hydrOXYzine (ATARAX) 25 MG tablet Take 1 tablet (25 mg total) by mouth QID. 120 tablet 3    ipratropium-albuterol (DUONEB) 0.5-2.5 (3) MG/3ML SOLN Take 3 mLs by nebulization every 4 (four) hours as needed.     loperamide (IMODIUM A-D) 2 MG tablet Take 2 mg by mouth 4 (four) times daily as needed for diarrhea or loose stools.     metoprolol tartrate (LOPRESSOR) 25 MG tablet Take 1 tablet (25 mg total) by mouth 2 (two) times daily. 180 tablet 3   mupirocin ointment (BACTROBAN) 2 % Apply 1 Application topically 2 (two) times daily. 22 g 0   naproxen (NAPROSYN) 375 MG tablet Take 1 tablet (375 mg total) by mouth 2 (two) times daily with a meal. 20 tablet 0   omeprazole (PRILOSEC) 20 MG capsule Take 1 capsule (20 mg total) by mouth daily. 30 capsule 3   promethazine-dextromethorphan (PROMETHAZINE-DM) 6.25-15 MG/5ML syrup Take 5 mLs by mouth 4 (four) times daily as needed for cough. (Patient not taking: Reported on 07/08/2022) 118 mL 0   propranolol (INDERAL) 10 MG tablet Take 2 tablets (20 mg total) by mouth 3 (three) times daily as needed (palpitations). 60 tablet 11   QUEtiapine (SEROQUEL) 50 MG tablet Take 1 tablet (50 mg total) by mouth at bedtime. 30 tablet 3   sertraline (ZOLOFT) 100 MG tablet Take 2 tablets (200 mg total) by mouth at bedtime. 60 tablet 3   tirzepatide (ZEPBOUND) 2.5 MG/0.5ML Pen Inject 2.5 mg into the skin once a week. 2 mL 0   traZODone (DESYREL) 100 MG tablet Take 1 tablet (100 mg total) by mouth at bedtime as needed for sleep. 30 tablet 3   No current facility-administered medications for this visit.     Musculoskeletal: Strength & Muscle Tone: within normal limits and telehealth visit Gait & Station: normal, telehealth visit Patient leans: N/A  Psychiatric Specialty Exam: Review of Systems  There were no vitals taken for this visit.There is no height or weight on file to calculate BMI.  General Appearance: Well Groomed  Eye Contact:  Good  Speech:  Clear and Coherent and Normal Rate  Volume:  Normal  Mood:  Anxious  Affect:  Congruent  Thought Process:   Coherent, Goal Directed and Linear  Orientation:  Full (Time, Place, and Person)  Thought Content: WDL and Logical   Suicidal Thoughts:  No  Homicidal Thoughts:  No  Memory:  Immediate;   Good Recent;   Good Remote;   Good  Judgement:  Good  Insight:  Good  Psychomotor Activity:  Normal  Concentration:  Concentration: Good and Attention Span: Good  Recall:  Good  Fund of Knowledge: Good  Language: Good  Akathisia:  No  Handed:  Right  AIMS (if indicated): Not done  Assets:  Communication Skills Desire for Improvement Financial Resources/Insurance Housing Social Support  ADL's:  Intact  Cognition: WNL  Sleep:  Fair   Screenings: AIMS    Flowsheet Row Ad[facility] (Discharged) [facility]  AIMS Total Score 0      AUDIT    Flowsheet Row Admission (Discharged) [facility]  Alcohol Use Disorder Identification Test Final Score (AUDIT) 0      GAD-7    Flowsheet Row Video Visit from 08/21/2022 in [facility] Video Visit from 06/12/2022 in [facility] Video  Visit from 04/03/2022 in [facility] Video Visit from 07/23/2021 in [facility] Video Visit from 02/16/2021 in [facility]  Total GAD-7 Score 16 8 18 16 16       PHQ2-9    Flowsheet Row Video Visit from 08/21/2022 in [facility] Video Visit from 06/12/2022 in [facility] Video Visit from 04/03/2022 in [facility] Video Visit from 07/23/2021 in [facility] Video Visit from 02/16/2021 in [facility]  PHQ-2 Total Score 2 3 6 4 6   PHQ-9 Total Score 12 12 24 17 20       Flowsheet Row Video Visit from 04/03/2022 in [facility] Video Visit from 07/23/2021 in [facility] ED from 06/14/2021 in [facility] Emergency Department at [facility]  C-SSRS RISK CATEGORY Error: Q7 should not be populated when Q6 is No Error: Q7 should not be populated when Q6 is No No Risk        Assessment and Plan: Patient endorses increased anxiety and hypersomnia. Today hydroxyzine 25 mg 3 times daily increased to 4 times daily to help manage anxiety.  She will continue medications as prescribed.   1. Social phobia  Continue- hydrOXYzine (ATARAX) 25 MG tablet; Take 1 tablet (25 mg total) by mouth QID.  Dispense: 120 tablet; Refill: 3 Continue- busPIRone (BUSPAR) 10 MG tablet; Take 1 tablet (10 mg total) by mouth 3 (three) times daily.  Dispense: 90 tablet; Refill: 3 Continue- traZODone (DESYREL) 100 MG tablet; Take 1 tablet (100 mg total) by mouth at bedtime as needed for sleep.  Dispense: 30 tablet; Refill: 3 Continue- sertraline (ZOLOFT) 100 MG tablet; Take 2 tablets (200 mg total) by mouth at bedtime.  Dispense: 60 tablet; Refill: 3  2. Severe episode of recurrent major depressive disorder, with psychotic features (HCC)  Continue- busPIRone (BUSPAR) 10 MG tablet; Take 1 tablet (10 mg total) by mouth 3 (three) times daily.  Dispense: 90 tablet; Refill: 3 Continue- traZODone (DESYREL) 100 MG tablet; Take 1 tablet (100 mg total) by mouth at bedtime as needed for sleep.  Dispense: 30 tablet; Refill: 3 Continue- sertraline (ZOLOFT) 100 MG tablet; Take 2 tablets (200 mg total) by mouth at bedtime.  Dispense: 60 tablet; Refill: 3 Continue- QUEtiapine (SEROQUEL) 50 MG tablet; Take 1 tablet (50 mg total) by mouth at bedtime.  Dispense: 30 tablet; Refill: 3  Follow-up in 3 months Follow-up therapy Shanna Cisco, NP 08/21/2022, 11:44 AM

## 2022-08-22 ENCOUNTER — Telehealth: Payer: Self-pay | Admitting: Physician Assistant

## 2022-08-22 ENCOUNTER — Other Ambulatory Visit: Payer: Self-pay

## 2022-08-22 NOTE — Telephone Encounter (Signed)
Inbound call from patient requesting a call back . Patient stated she as supposed to do some fecal test couple months ago but her Insurance lapsed so she was not able to do those.She now has Insurance she is trying to get back on track and she is wanting to know if the order can be sent .Please advise

## 2022-08-23 NOTE — Telephone Encounter (Signed)
The patient has not been seen in 13 months. I have responded to her My Chart message asking she schedule an appointment.

## 2022-08-26 ENCOUNTER — Other Ambulatory Visit: Payer: Self-pay

## 2022-08-26 DIAGNOSIS — R109 Unspecified abdominal pain: Secondary | ICD-10-CM

## 2022-08-26 DIAGNOSIS — K529 Noninfective gastroenteritis and colitis, unspecified: Secondary | ICD-10-CM

## 2022-08-26 NOTE — Telephone Encounter (Signed)
Called the patient's phone. Her mother answers. She will tell the patient  Esterwood, [name] S, PA-C  You1 hour ago (11:33 AM)    Breath test for SIBO and fecal elastase are what I had ordered- we can place order for the fecal elastase- doesn't look like she did the breath test - Im ok re- ordering if she didnt do it

## 2022-08-27 ENCOUNTER — Encounter: Payer: Self-pay | Admitting: Family Medicine

## 2022-08-27 ENCOUNTER — Other Ambulatory Visit: Payer: Self-pay

## 2022-08-27 ENCOUNTER — Ambulatory Visit (INDEPENDENT_AMBULATORY_CARE_PROVIDER_SITE_OTHER): Payer: MEDICAID | Admitting: Family Medicine

## 2022-08-27 VITALS — BP 135/88 | HR 74 | Wt 309.5 lb

## 2022-08-27 DIAGNOSIS — Q796 Ehlers-Danlos syndrome, unspecified: Secondary | ICD-10-CM | POA: Diagnosis not present

## 2022-08-27 DIAGNOSIS — N939 Abnormal uterine and vaginal bleeding, unspecified: Secondary | ICD-10-CM | POA: Diagnosis not present

## 2022-08-27 NOTE — Progress Notes (Signed)
GYNECOLOGY OFFICE VISIT NOTE  History:   Kristen [name] is a 37 y.o. G0P0 here today for heavy and painful periods.  Patient referred by PCP Last seen 08/17/20 by Dr. Macon Large, at that time discussed IUD insertion but ultimately did not undergo due to concerns about pain and wanting to be fully sedated for the procedure, which has been difficult to do due to insurance. To date has not been placed Also has hx of hyteroscopy/D&C/polypectomy in 12/2019  Today reports she has ongoing painful heavy periods that often soak through multiple pads She initially stated that ideally she would not have a period, but then also stated she would like to get pregnant, she is just not sure if she can Then stated she would like to just have a hysterectomy Also worried about passing on genetic things like her mother's arnold-chiari malformation, her scoliosis, and her ehlers-danlos syndrome Is very worried about IUD placement and wanting to be under general anesthesia  Health Maintenance Due  Topic Date Due   Hepatitis C Screening  Never done   DTaP/Tdap/Td (1 - Tdap) Never done   COVID-19 Vaccine (3 - 2023-24 season) 10/05/2021    Past Medical History:  Diagnosis Date   Anxiety 2013   treated at [facility] by Deatra Robinson    Asthma 04-01-87    no hospitalization, or intubation, previously on advair and singulair. asthma worsening.    Congenital third kidney 07-16-87    Right side , dx in utero    Depression 2001   depressed since childhood    Dysrhythmia 2010   PVC's   Essential hypertension 03/12/2019   GERD (gastroesophageal reflux disease)    History of hiatal hernia    History of suicidal ideation    IBS (irritable bowel syndrome)    Kyphosis of thoracic region 2004    painful, runs in dad side    Migraine    Pancreatitis 2003   gallstone pancreatitis    Pancreatitis due to biliary obstruction 2003   PCOS (polycystic ovarian syndrome) 2014   facial hair, irregular periods, never been  pregnant    Sleep apnea    no longer uses cpap    Past Surgical History:  Procedure Laterality Date   BIOPSY  06/11/2021   Procedure: BIOPSY;  Surgeon: Napoleon Form, MD;  [facility];  Service: Gastroenterology;;   BLADDER SURGERY     CHOLECYSTECTOMY  2003    COLONOSCOPY WITH PROPOFOL N/A 06/11/2021   Procedure: COLONOSCOPY WITH PROPOFOL;  Surgeon: Napoleon Form, MD;  [facility];  Service: Gastroenterology;  Laterality: N/A;   DILATATION & CURETTAGE/HYSTEROSCOPY WITH MYOSURE N/A 12/14/2019   Procedure: DILATATION & CURETTAGE/HYSTEROSCOPY WITH Polypectomy with MYOSURE;  Surgeon: Tereso Newcomer, MD;  Location[facility];  Service: Gynecology;  Laterality: N/A;   OPEN REDUCTION INTERNAL FIXATION (ORIF) PROXIMAL PHALANX Right 07/23/2013   Procedure: OPEN TREATMENT RIGHT RING FINGER, PROXIMAL INTERPHALANGEAL/JOINT FRACTURE DISLOCATION;  Surgeon: Jodi Marble, MD;  Location: [facility];  Service: Orthopedics;  Laterality: Right;   TONSILLECTOMY     and adenoidectomy    The following portions of the patient's history were reviewed and updated as appropriate: allergies, current medications, past family history, past medical history, past social history, past surgical history and problem list.   Health Maintenance:   Last pap: Lab Results  Component Value Date   DIAGPAP  03/10/2019    - Negative for intraepithelial lesion or malignancy (NILM)   HPVHIGH Negative 03/10/2019  Last mammogram:  N/a    Review of Systems:  Pertinent items noted in HPI and remainder of comprehensive ROS otherwise negative.  Physical Exam:  BP 135/88   Pulse 74   Wt (!) 309 lb 8 oz (140.4 kg)   LMP  (LMP Unknown)   BMI 49.95 kg/m  CONSTITUTIONAL: Well-developed, well-nourished female in no acute distress.  HEENT:  Normocephalic, atraumatic. External right and left ear normal. No scleral icterus.  NECK: Normal range of motion, supple, no masses noted on  observation SKIN: No rash noted. Not diaphoretic. No erythema. No pallor. MUSCULOSKELETAL: Normal range of motion. No edema noted. NEUROLOGIC: Alert and oriented to person, place, and time. Normal muscle tone coordination.  PSYCHIATRIC: Normal mood and affect. Normal behavior. Normal judgment and thought content. RESPIRATORY: Effort normal, no problems with respiration noted   Labs and Imaging No results found for this or any previous visit (from the past 168 hour(s)). No results found.    Assessment and Plan:   Problem List Items Addressed This Visit       Musculoskeletal and Integument   Ehlers-Danlos disease    Incorrectly told patient that [facility] did not have genetic testing available as far as inheritance--though did discuss that scoliosis/Arnold chiari do not have a genetic etiology. Will offer genetics referral via MyChart message.         Genitourinary   Abnormal uterine bleeding (AUB) - Primary    Long conversation with patient, ultimately my recommendation was to more clearly define what her goals were. We discussed at length that not being fully decided on whether or not she would like to get pregnant is at cross purposes with definitive management of her DUB, and that if she wants to try and treat her DUB the best fertility sparing option would be an IUD and weight loss. We also briefly discussed why weight loss has been a focus of many prior conversations with doctors because of how unopposed estrogen from obesity is contributing to this issue. I also spent a long time discussing that besides the fact that I do not do any procedures beyond IUD insertion for DUB, there is a strong possibility with her BMI and other comorbidities that surgery could make her life worse, not better. We discussed at length that there can be significant morbidity with hysterectomy and that she should strongly consider a step wise approach to her DUB to see if she can achieve her desired result without the  most drastic treatment approach. Review of her imaging shows no fibroid disease. Discussed that IUD and endometrial ablation should be considered but discussed in detail with a gynecological surgeon. We also discussed that due to changes in OR staffing she may have to wait many months if she wants to have an IUD placed under general anesthesia, and that I would strongly recommend an in office attempt with adjunctive medicines and techniques like ativan, oxycodone, and paracervical block.  Ultimately though, I reviewed with patient that it is impossible to move forward until she more clearly decides if she would like to get pregnant. If she does then she would need to go to weight loss clinic, engage in strategies to optimize and track her ovulation, etc. If not then we should explore her other options.   We left our conversation with me asking her to think hard and make a firm decision about what path she would like to pursue, fertility vs DUB treatment, and that she should schedule a follow up visit in  1 month to discuss her decision and move forward with a Research officer, trade union.        Routine preventative health maintenance measures emphasized. Please refer to After Visit Summary for other counseling recommendations.   Return in about 4 weeks (around 09/24/2022) for follow up DUB.    Total face-to-face time with patient: 30 minutes.  Over 50% of encounter was spent on counseling and coordination of care.   Venora Maples, MD/MPH Attending Family Medicine Physician, [facility] for [facility], [facility]

## 2022-08-28 ENCOUNTER — Other Ambulatory Visit: Payer: Self-pay

## 2022-08-29 ENCOUNTER — Encounter: Payer: Self-pay | Admitting: Family Medicine

## 2022-08-29 NOTE — Assessment & Plan Note (Signed)
Long conversation with patient, ultimately my recommendation was to more clearly define what her goals were. We discussed at length that not being fully decided on whether or not she would like to get pregnant is at cross purposes with definitive management of her DUB, and that if she wants to try and treat her DUB the best fertility sparing option would be an IUD and weight loss. We also briefly discussed why weight loss has been a focus of many prior conversations with doctors because of how unopposed estrogen from obesity is contributing to this issue. I also spent a long time discussing that besides the fact that I do not do any procedures beyond IUD insertion for DUB, there is a strong possibility with her BMI and other comorbidities that surgery could make her life worse, not better. We discussed at length that there can be significant morbidity with hysterectomy and that she should strongly consider a step wise approach to her DUB to see if she can achieve her desired result without the most drastic treatment approach. Review of her imaging shows no fibroid disease. Discussed that IUD and endometrial ablation should be considered but discussed in detail with a gynecological surgeon. We also discussed that due to changes in OR staffing she may have to wait many months if she wants to have an IUD placed under general anesthesia, and that I would strongly recommend an in office attempt with adjunctive medicines and techniques like ativan, oxycodone, and paracervical block.  Ultimately though, I reviewed with patient that it is impossible to move forward until she more clearly decides if she would like to get pregnant. If she does then she would need to go to weight loss clinic, engage in strategies to optimize and track her ovulation, etc. If not then we should explore her other options.   We left our conversation with me asking her to think hard and make a firm decision about what path she would like to  pursue, fertility vs DUB treatment, and that she should schedule a follow up visit in 1 month to discuss her decision and move forward with a Research officer, trade [employer].

## 2022-08-29 NOTE — Assessment & Plan Note (Signed)
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 Will offer genetics referral via MyChart message.

## 2022-09-13 ENCOUNTER — Ambulatory Visit: Payer: Self-pay

## 2022-09-13 ENCOUNTER — Ambulatory Visit
Admission: EM | Admit: 2022-09-13 | Discharge: 2022-09-13 | Disposition: A | Discharge status: Home / Self Care | Payer: MEDICAID | Attending: Internal Medicine | Admitting: Internal Medicine

## 2022-09-13 ENCOUNTER — Other Ambulatory Visit: Payer: MEDICAID

## 2022-09-13 DIAGNOSIS — R197 Diarrhea, unspecified: Secondary | ICD-10-CM | POA: Diagnosis present

## 2022-09-13 DIAGNOSIS — A084 Viral intestinal infection, unspecified: Secondary | ICD-10-CM | POA: Insufficient documentation

## 2022-09-13 DIAGNOSIS — R112 Nausea with vomiting, unspecified: Secondary | ICD-10-CM | POA: Insufficient documentation

## 2022-09-13 DIAGNOSIS — K529 Noninfective gastroenteritis and colitis, unspecified: Secondary | ICD-10-CM

## 2022-09-13 DIAGNOSIS — Z1152 Encounter for screening for COVID-19: Secondary | ICD-10-CM | POA: Diagnosis not present

## 2022-09-13 DIAGNOSIS — R109 Unspecified abdominal pain: Secondary | ICD-10-CM

## 2022-09-13 MED ORDER — ONDANSETRON 4 MG PO TBDP
4.0000 mg | ORAL_TABLET | Freq: Once | ORAL | Status: AC
  Administered 2022-09-13: 4 mg via ORAL

## 2022-09-13 MED ORDER — ONDANSETRON 4 MG PO TBDP: 4.0000 mg | ORAL_TABLET | Freq: Three times a day (TID) | ORAL | 0 refills | Status: DC | PRN

## 2022-09-13 NOTE — Telephone Encounter (Signed)
    Chief Complaint: Vomiting, diarrhea Symptoms: Above Frequency: 2-3 days Pertinent Negatives: Patient denies fever Disposition: [] ED /[x] Urgent Care (no appt availability in office) / [] Appointment(In office/virtual)/ []  [facility]/ [] Home Care/ [] Refused Recommended Disposition /[]  Mobile Bus/ []  Follow-up with PCP Additional Notes: States she may go to ED.  Reason for Disposition  [1] MILD or MODERATE vomiting AND [2] present > 48 hours (2 days) (Exception: Mild vomiting with associated diarrhea.)  Answer Assessment - Initial Assessment Questions 1. VOMITING SEVERITY: "How many times have you vomited in the past 24 hours?"     - MILD:  1 - 2 times/day    - MODERATE: 3 - 5 times/day, decreased oral intake without significant weight loss or symptoms of dehydration    - SEVERE: 6 or more times/day, vomits everything or nearly everything, with significant weight loss, symptoms of dehydration      X 2 2. ONSET: "When did the vomiting begin?"      2-3 days 3. FLUIDS: "What fluids or food have you vomited up today?" "Have you been able to keep any fluids down?"     Small amount 4. ABDOMEN PAIN: "Are your having any abdomen pain?" If Yes : "How bad is it and what does it feel like?" (e.g., crampy, dull, intermittent, constant)      Cramping  6/10 5. DIARRHEA: "Is there any diarrhea?" If Yes, ask: "How many times today?"      Yes -  5 6. CONTACTS: "Is there anyone else in the family with the same symptoms?"      No 7. CAUSE: "What do you think is causing your vomiting?"     Unsure 8. HYDRATION STATUS: "Any signs of dehydration?" (e.g., dry mouth [not only dry lips], too weak to stand) "When did you last urinate?"     Yes 9. OTHER SYMPTOMS: "Do you have any other symptoms?" (e.g., fever, headache, vertigo, vomiting blood or coffee grounds, recent head injury)     Headache 10. PREGNANCY: "Is there any chance you are pregnant?" "When was your last menstrual  period?"       No  Protocols used: Vomiting-A-AH

## 2022-09-13 NOTE — Discharge Instructions (Signed)
The clinic will contact you with results of the COVID test done today if positive.  Your symptoms and exam are consistent with a viral stomach flu.  You were given a dose of Zofran in the clinic for nausea and vomiting.  A prescription has been sent to your pharmacy and you may take this every 8 hours as needed.  Please try to stay hydrated with Gatorade, Powerade, Pedialyte, water.  Brat diet and advance as you tolerate.  Please follow-up with your PCP in 2 days for recheck.  Please go to the ER if you develop any worsening symptoms.  I hope you feel better soon!

## 2022-09-13 NOTE — ED Provider Notes (Signed)
[facility]    CSN: [id number] Arrival date & time: 09/13/22  1618      History   Chief Complaint No chief complaint on file.   HPI Kristen [name] is a 37 y.o. female who presents for evaluation of nausea/vomiting/diarrhea.  Patient reports 3 days N/V/D.  Denies any blood in the vomit or stool.  Reports 1-3 episodes of both diarrhea and vomiting per day.  No fevers or URI symptoms.  She does have a history of IBS-D.  She has been taking Imodium intermittently for symptoms.  States diarrhea has been worse than normal.  No recent travel or ingestion of contaminated or undercooked foods.  No known sick contacts.  She is able to keep some fluids down but no food.  No other concerns at this time.  HPI  Past Medical History:  Diagnosis Date   Anxiety 2013   treated at [facility] by Deatra Robinson    Asthma 1989    no hospitalization, or intubation, previously on advair and singulair. asthma worsening.    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 , dx in utero    Depression 2001   depressed since childhood    Dysrhythmia 2010   PVC's   Essential hypertension 03/12/2019   GERD (gastroesophageal reflux disease)    History of hiatal hernia    History of suicidal ideation    IBS (irritable bowel syndrome)    Kyphosis of thoracic region 2004    painful, runs in dad side    Migraine    Pancreatitis 2003   gallstone pancreatitis    Pancreatitis due to biliary obstruction 2003   PCOS (polycystic ovarian syndrome) 2014   facial hair, irregular periods, never been pregnant    Sleep apnea    no longer uses cpap    Patient Active Problem List   Diagnosis Date Noted   Diarrhea    Chronic diarrhea    Weight gain 11/01/2020   Hirsutism 11/01/2020   Gastroesophageal reflux disease without esophagitis 07/11/2020   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   POTS (postural orthostatic tachycardia syndrome) 06/09/2020   Morbid obesity (HCC) 12/14/2019   Endometrial polyp    Moderate  episode of recurrent major depressive disorder (HCC) 11/18/2019   Encounter for autism screening 11/18/2019   Abnormal uterine bleeding (AUB) 11/08/2019   Severe episode of recurrent major depressive disorder, 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   PTSD (post-traumatic stress disorder) 09/20/2019   Irritable bowel syndrome with diarrhea 04/23/2019   Abnormal serum thyroid stimulating hormone (TSH) level 04/23/2019   MDD (major depressive disorder), recurrent severe, 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   OSA on CPAP 03/17/2019   Anxiety and depression 03/12/2019   Chronic migraine with aura 03/12/2019   Essential hypertension 03/12/2019   Mild intermittent asthma without complication 03/12/2019   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   Elevated prolactin level 07/05/2016   Vulvar lesion 07/05/2016   Restless legs 05/24/2016   Seasonal allergies 05/13/2014   Intertrigo 05/13/2014   Decreased vision 05/13/2014   PCOS (polycystic ovarian syndrome) 02/07/2014   Social phobia 02/02/2007   ADD 02/02/2007   DYSLEXIA 02/02/2007   Asthma, severe persistent 02/02/2007   SCOLIOSIS 02/02/2007   OTHER SPECIFIED CONGENITAL ANOMALIES OF KIDNEY 02/02/2007   ELECTROCARDIOGRAM, ABNORMAL 02/02/2007    Past Surgical History:  Procedure Laterality Date   BIOPSY  06/11/2021   Procedure: BIOPSY;  Surgeon: Napoleon Form, MD;  Location: [facility];  Service: Gastroenterology;;   BLADDER SURGERY  CHOLECYSTECTOMY  2003    COLONOSCOPY WITH PROPOFOL N/A 06/11/2021   Procedure: COLONOSCOPY WITH PROPOFOL;  Surgeon: Napoleon Form, MD;  Location: [facility];  Service: Gastroenterology;  Laterality: N/A;   DILATATION & CURETTAGE/HYSTEROSCOPY WITH MYOSURE N/A 12/14/2019   Procedure: DILATATION & CURETTAGE/HYSTEROSCOPY WITH Polypectomy with MYOSURE;  Surgeon: Tereso Newcomer, MD;  Location: [facility];  Service: Gynecology;  Laterality: N/A;   OPEN REDUCTION INTERNAL FIXATION (ORIF) PROXIMAL  PHALANX Right 07/23/2013   Procedure: OPEN TREATMENT RIGHT RING FINGER, PROXIMAL INTERPHALANGEAL/JOINT FRACTURE DISLOCATION;  Surgeon: Jodi Marble, MD;  Location: [facility];  Service: Orthopedics;  Laterality: Right;   TONSILLECTOMY     and adenoidectomy    OB History     Gravida  0   Para      Term      Preterm      AB      Living         SAB      IAB      Ectopic      Multiple      Live Births               Home Medications    Prior to Admission medications   Medication Sig Start Date End Date Taking? Authorizing Provider  ondansetron (ZOFRAN-ODT) 4 MG disintegrating tablet Take 1 tablet (4 mg total) by mouth every 8 (eight) hours as needed for nausea or vomiting. 09/13/22  Yes Radford Pax, NP  albuterol (VENTOLIN HFA) 108 (90 Base) MCG/ACT inhaler Inhale 2 puffs into the lungs every 6 (six) hours as needed for wheezing or shortness of breath. 06/27/22   McClung, Marzella Schlein, PA-C  Benzocaine-Resorcinol (VAGISIL EX) Apply 1 application. topically as needed.    [provider]  busPIRone (BUSPAR) 10 MG tablet Take 1 tablet (10 mg total) by mouth 3 (three) times daily. 08/21/22   Shanna Cisco, NP  cholestyramine Lanetta Inch) 4 g packet Take 1 packet twice daily between meals 07/11/21   Esterwood, [name] S, PA-C  diclofenac Sodium (VOLTAREN) 1 % GEL Apply 2 g topically 4 (four) times daily. Patient taking differently: Apply 2 g topically as needed. 06/21/21   Marcine Matar, MD  fluticasone (FLONASE) 50 MCG/ACT nasal spray Place 2 sprays into both nostrils daily. Patient not taking: Reported on 08/27/2022 06/11/22   Waldon Merl, PA-C  gabapentin (NEURONTIN) 300 MG capsule TAKE 1 CAPSULE (300 MG TOTAL) BY MOUTH AT BEDTIME. Patient taking differently: Take 300 mg by mouth as needed (pain). 03/24/20 07/12/21  Marcine Matar, MD  hydrOXYzine (ATARAX) 25 MG tablet Take 1 tablet (25 mg total) by mouth 4 (four) times daily. 08/21/22    Toy Cookey E, NP  ipratropium-albuterol (DUONEB) 0.5-2.5 (3) MG/3ML SOLN Take 3 mLs by nebulization every 4 (four) hours as needed. Patient not taking: Reported on 08/27/2022    [provider]  loperamide (IMODIUM A-D) 2 MG tablet Take 2 mg by mouth 4 (four) times daily as needed for diarrhea or loose stools.    [provider]  metoprolol tartrate (LOPRESSOR) 25 MG tablet Take 1 tablet (25 mg total) by mouth 2 (two) times daily. Patient not taking: Reported on 08/27/2022 07/08/22   Swinyer, Zachary George, NP  mupirocin ointment (BACTROBAN) 2 % Apply 1 Application topically 2 (two) times daily. Patient not taking: Reported on 08/27/2022 06/27/22   Anders Simmonds, PA-C  naproxen (NAPROSYN) 375 MG tablet Take 1 tablet (375 mg  total) by mouth 2 (two) times daily with a meal. 06/15/21   Palumbo, April, MD  omeprazole (PRILOSEC) 20 MG capsule Take 1 capsule (20 mg total) by mouth daily. 07/11/20   Marcine Matar, MD  promethazine-dextromethorphan (PROMETHAZINE-DM) 6.25-15 MG/5ML syrup Take 5 mLs by mouth 4 (four) times daily as needed for cough. Patient not taking: Reported on 07/08/2022 06/11/22   Waldon Merl, PA-C  propranolol (INDERAL) 10 MG tablet Take 2 tablets (20 mg total) by mouth 3 (three) times daily as needed (palpitations). 07/08/22   Swinyer, Zachary George, NP  QUEtiapine (SEROQUEL) 50 MG tablet Take 1 tablet (50 mg total) by mouth at bedtime. 08/21/22   Shanna Cisco, NP  sertraline (ZOLOFT) 100 MG tablet Take 2 tablets (200 mg total) by mouth at bedtime. 08/21/22   Shanna Cisco, NP  traZODone (DESYREL) 100 MG tablet Take 1 tablet (100 mg total) by mouth at bedtime as needed for sleep. 08/21/22   Shanna Cisco, NP    Family History Family History  Problem Relation Age of Onset   Hypertension Mother    Arnold-Chiari malformation Mother    Restless legs syndrome Mother    Osteoporosis Mother    COPD Mother    Asthma Mother    Squamous cell carcinoma  Mother        skin    Eczema Mother    Arthritis Mother    Peripheral Artery Disease Mother    Anxiety disorder Mother    OCD Mother    Colonic polyp Mother    Hypertension Father    Scoliosis Father    Bipolar disorder Father    Congestive Heart Failure Father    COPD Father    Depression Father    Diabetes Maternal Grandmother    Hypertension Maternal Grandmother    Dementia Maternal Grandmother    OCD Maternal Grandmother    Alzheimer's disease Maternal Grandmother    Bone cancer Maternal Grandfather 42       bone marrow    Multiple myeloma Maternal Grandfather    Alcohol abuse Maternal Grandfather    Drug abuse Maternal Grandfather    Cancer Paternal Grandmother        colon   Diabetes Paternal Grandfather    Alcohol abuse Paternal Grandfather    Drug abuse Paternal Grandfather    Dementia Paternal Grandfather    Alcohol abuse Maternal Aunt    Depression Maternal Aunt    Pancreatic cancer Maternal Aunt    Alcohol abuse Paternal Aunt    Depression Paternal Aunt    ADD / ADHD Cousin     Social History Social History   Tobacco Use   Smoking status: Never   Smokeless tobacco: Never  Vaping Use   Vaping status: Never Used  Substance Use Topics   Alcohol use: No   Drug use: No     Allergies   Mucinex [guaifenesin er], Penicillins, Contrast media [iodinated contrast media], Latex, Multihance [gadobenate], and Other   Review of Systems Review of Systems  Gastrointestinal:  Positive for diarrhea, nausea and vomiting.     Physical Exam Triage Vital Signs ED Triage Vitals  Encounter Vitals Group     BP 09/13/22 1707 (!) 146/83     Systolic BP Percentile --      Diastolic BP Percentile --      Pulse Rate 09/13/22 1707 85     Resp 09/13/22 1707 18     Temp 09/13/22 1707 98.3 F (36.8 C)  Temp Source 09/13/22 1707 Oral     SpO2 09/13/22 1707 97 %     Weight --      Height --      Head Circumference --      Peak Flow --      Pain Score 09/13/22  1717 2     Pain Loc --      Pain Education --      Exclude from Growth Chart --    No data found.  Updated Vital Signs BP (!) 146/83 (BP Location: Left Arm)   Pulse 85   Temp 98.3 F (36.8 C) (Oral)   Resp 18   LMP  (LMP Unknown)   SpO2 97%   Visual Acuity Right Eye Distance:   Left Eye Distance:   Bilateral Distance:    Right Eye Near:   Left Eye Near:    Bilateral Near:     Physical Exam Vitals and nursing note reviewed.  Constitutional:      General: She is not in acute distress.    Appearance: Normal appearance. She is obese. She is not ill-appearing, toxic-appearing or diaphoretic.  HENT:     Head: Normocephalic and atraumatic.  Eyes:     Pupils: Pupils are equal, round, and reactive to light.  Cardiovascular:     Rate and Rhythm: Normal rate.  Pulmonary:     Effort: Pulmonary effort is normal.  Abdominal:     General: Bowel sounds are normal.     Palpations: Abdomen is soft.     Tenderness: There is generalized abdominal tenderness. Negative signs include Rovsing's sign and McBurney's sign.  Skin:    General: Skin is warm and dry.  Neurological:     General: No focal deficit present.     Mental Status: She is alert and oriented to person, place, and time.  Psychiatric:        Mood and Affect: Mood normal.        Behavior: Behavior normal.      UC Treatments / Results  Labs (all labs ordered are listed, but only abnormal results are displayed) Labs Reviewed  SARS CORONAVIRUS 2 (TAT 6-24 HRS)    EKG   Radiology No results found.  Procedures Procedures (including critical care time)  Medications Ordered in UC Medications  ondansetron (ZOFRAN-ODT) disintegrating tablet 4 mg (4 mg Oral Given 09/13/22 1741)    Initial Impression / Assessment and Plan / UC Course  I have reviewed the triage vital signs and the nursing notes.  Pertinent labs & imaging results that were available during my care of the patient were reviewed by me and considered  in my medical decision making (see chart for details).     Reviewed exam and symptoms with patient.  No red flags.  COVID PCR and will contact if positive.  Discussed viral enteritis and symptomatic treatment.  Patient was given Zofran in clinic and Rx was sent to pharmacy.  Discussed hydration/electrolyte replacement and brat diet.  PCP follow-up if symptoms do not improve.  ER precautions reviewed and patient verbalized understanding. Final Clinical Impressions(s) / UC Diagnoses   Final diagnoses:  Nausea and vomiting, unspecified vomiting type  Viral gastroenteritis     Discharge Instructions      The clinic will contact you with results of the COVID test done today if positive.  Your symptoms and exam are consistent with a viral stomach flu.  You were given a dose of Zofran in the clinic for nausea and  vomiting.  A prescription has been sent to your pharmacy and you may take this every 8 hours as needed.  Please try to stay hydrated with Gatorade, Powerade, Pedialyte, water.  Brat diet and advance as you tolerate.  Please follow-up with your PCP in 2 days for recheck.  Please go to the ER if you develop any worsening symptoms.  I hope you feel better soon!    ED Prescriptions     Medication Sig Dispense Auth. Provider   ondansetron (ZOFRAN-ODT) 4 MG disintegrating tablet Take 1 tablet (4 mg total) by mouth every 8 (eight) hours as needed for nausea or vomiting. 20 tablet Radford Pax, NP      PDMP not reviewed this encounter.   Radford Pax, NP 09/13/22 1743

## 2022-09-13 NOTE — ED Triage Notes (Signed)
Pt presents to UC w/ c/o nausea, vomiting, diarrhea x3 days. Pt states she has tried to drink fluids today but has been vomiting them up. Has tried 2 immodiums today without relief. Hx IBSD, pancreatitis, and gallbladder removed at 36 years old.

## 2022-09-21 LAB — PANCREATIC ELASTASE, FECAL: Pancreatic Elastase-1, Stool: >500 mcg/g

## 2022-09-27 ENCOUNTER — Other Ambulatory Visit: Payer: Self-pay

## 2022-10-01 ENCOUNTER — Other Ambulatory Visit: Payer: Self-pay

## 2022-10-01 ENCOUNTER — Ambulatory Visit (INDEPENDENT_AMBULATORY_CARE_PROVIDER_SITE_OTHER): Payer: MEDICAID | Admitting: Obstetrics & Gynecology

## 2022-10-01 DIAGNOSIS — N938 Other specified abnormal uterine and vaginal bleeding: Secondary | ICD-10-CM

## 2022-10-01 DIAGNOSIS — E282 Polycystic ovarian syndrome: Secondary | ICD-10-CM | POA: Diagnosis not present

## 2022-10-01 DIAGNOSIS — F419 Anxiety disorder, unspecified: Secondary | ICD-10-CM

## 2022-10-01 DIAGNOSIS — F32A Depression, unspecified: Secondary | ICD-10-CM

## 2022-10-01 MED ORDER — MEDROXYPROGESTERONE ACETATE 5 MG PO TABS
5.0000 mg | ORAL_TABLET | Freq: Every day | ORAL | 6 refills | Status: DC
  Filled 2022-10-01: qty 30, 30d supply, fill #0

## 2022-10-01 MED ORDER — MEDROXYPROGESTERONE ACETATE 5 MG PO TABS
10.0000 mg | ORAL_TABLET | Freq: Every day | ORAL | 1 refills | Status: DC
  Filled 2022-10-01 – 2022-10-11 (×2): qty 30, 15d supply, fill #0

## 2022-10-01 NOTE — Progress Notes (Unsigned)
Patient ID: Kristen [name], female   DOB: 1985/08/11, 37 y.o.   MRN: [id number]  Chief Complaint  Patient presents with   Dysfunctional Uterine Bleeding    HPI Kristen [name] is a 37 y.o. female.  G0P0 No LMP recorded. (Menstrual status: Other). LMP 3 mo ago, h/o irregular menses, DUB. She used OCP several years ago but was concerned about weight gain. No current hormonal management. Surgical and hormonal options were discussed with Dr. Crissie Reese last visit. She would be interested in LNGIUD if she could have sedation due to low pain tolerance and anxiety HPI  Past Medical History:  Diagnosis Date   Anxiety 2013   treated at [facility] by Deatra Robinson    Asthma 1989    no hospitalization, or intubation, previously on advair and singulair. asthma worsening.    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 , dx in utero    Depression 2001   depressed since childhood    Dysrhythmia 2010   PVC's   Essential hypertension 03/12/2019   GERD (gastroesophageal reflux disease)    History of hiatal hernia    History of suicidal ideation    IBS (irritable bowel syndrome)    Kyphosis of thoracic region 2004    painful, runs in dad side    Migraine    Pancreatitis 2003   gallstone pancreatitis    Pancreatitis due to biliary obstruction 2003   PCOS (polycystic ovarian syndrome) 2014   facial hair, irregular periods, never been pregnant    Sleep apnea    no longer uses cpap    Past Surgical History:  Procedure Laterality Date   BIOPSY  06/11/2021   Procedure: BIOPSY;  Surgeon: Napoleon Form, MD;  Location: [facility];  Service: Gastroenterology;;   BLADDER SURGERY     CHOLECYSTECTOMY  2003    COLONOSCOPY WITH PROPOFOL N/A 06/11/2021   Procedure: COLONOSCOPY WITH PROPOFOL;  Surgeon: Napoleon Form, MD;  Location: [facility];  Service: Gastroenterology;  Laterality: N/A;   DILATATION & CURETTAGE/HYSTEROSCOPY WITH MYOSURE N/A 12/14/2019   Procedure: DILATATION &  CURETTAGE/HYSTEROSCOPY WITH Polypectomy with MYOSURE;  Surgeon: Tereso Newcomer, MD;  Location: [facility];  Service: Gynecology;  Laterality: N/A;   OPEN REDUCTION INTERNAL FIXATION (ORIF) PROXIMAL PHALANX Right 07/23/2013   Procedure: OPEN TREATMENT RIGHT RING FINGER, PROXIMAL INTERPHALANGEAL/JOINT FRACTURE DISLOCATION;  Surgeon: Jodi Marble, MD;  Location: [facility];  Service: Orthopedics;  Laterality: Right;   TONSILLECTOMY     and adenoidectomy    Family History  Problem Relation Age of Onset   Hypertension Mother    Amica Harron-Chiari malformation Mother    Restless legs syndrome Mother    Osteoporosis Mother    COPD Mother    Asthma Mother    Squamous cell carcinoma Mother        skin    Eczema Mother    Arthritis Mother    Peripheral Artery Disease Mother    Anxiety disorder Mother    OCD Mother    Colonic polyp Mother    Hypertension Father    Scoliosis Father    Bipolar disorder Father    Congestive Heart Failure Father    COPD Father    Depression Father    Diabetes Maternal Grandmother    Hypertension Maternal Grandmother    Dementia Maternal Grandmother    OCD Maternal Grandmother    Alzheimer's disease Maternal Grandmother    Bone cancer Maternal Grandfather 11  bone marrow    Multiple myeloma Maternal Grandfather    Alcohol abuse Maternal Grandfather    Drug abuse Maternal Grandfather    Cancer Paternal Grandmother        colon   Diabetes Paternal Grandfather    Alcohol abuse Paternal Grandfather    Drug abuse Paternal Grandfather    Dementia Paternal Grandfather    Alcohol abuse Maternal Aunt    Depression Maternal Aunt    Pancreatic cancer Maternal Aunt    Alcohol abuse Paternal Aunt    Depression Paternal Aunt    ADD / ADHD Cousin     Social History Social History   Tobacco Use   Smoking status: Never   Smokeless tobacco: Never  Vaping Use   Vaping status: Never Used  Substance Use Topics   Alcohol use: No   Drug use:  No    Allergies  Allergen Reactions   Mucinex [Guaifenesin Er] Hives, Itching and Swelling   Penicillins Swelling    Did it involve swelling of the face/tongue/throat, SOB, or low BP? Yes Did it involve sudden or severe rash/hives, skin peeling, or any reaction on the inside of your mouth or nose? No Did you need to seek medical attention at a hospital or doctor's office? Yes When did it last happen?      >10 years ago If all above answers are "NO", may proceed with cephalosporin use.    Contrast Media [Iodinated Contrast Media] Nausea And Vomiting   Latex Hives and Swelling   Multihance [Gadobenate] Nausea And Vomiting   Other     Pickles - throat swelling and nausea     Current Outpatient Medications  Medication Sig Dispense Refill   albuterol (VENTOLIN HFA) 108 (90 Base) MCG/ACT inhaler Inhale 2 puffs into the lungs every 6 (six) hours as needed for wheezing or shortness of breath. 18 g 1   Benzocaine-Resorcinol (VAGISIL EX) Apply 1 application. topically as needed.     busPIRone (BUSPAR) 10 MG tablet Take 1 tablet (10 mg total) by mouth 3 (three) times daily. 90 tablet 3   cholestyramine (QUESTRAN) 4 g packet Take 1 packet twice daily between meals 60 each 6   diclofenac Sodium (VOLTAREN) 1 % GEL Apply 2 g topically 4 (four) times daily. (Patient taking differently: Apply 2 g topically as needed.) 100 g 0   hydrOXYzine (ATARAX) 25 MG tablet Take 1 tablet (25 mg total) by mouth 4 (four) times daily. 120 tablet 3   loperamide (IMODIUM A-D) 2 MG tablet Take 2 mg by mouth 4 (four) times daily as needed for diarrhea or loose stools.     naproxen (NAPROSYN) 375 MG tablet Take 1 tablet (375 mg total) by mouth 2 (two) times daily with a meal. 20 tablet 0   omeprazole (PRILOSEC) 20 MG capsule Take 1 capsule (20 mg total) by mouth daily. 30 capsule 3   ondansetron (ZOFRAN-ODT) 4 MG disintegrating tablet Take 1 tablet (4 mg total) by mouth every 8 (eight) hours as needed for nausea or  vomiting. 20 tablet 0   propranolol (INDERAL) 10 MG tablet Take 2 tablets (20 mg total) by mouth 3 (three) times daily as needed (palpitations). 60 tablet 11   QUEtiapine (SEROQUEL) 50 MG tablet Take 1 tablet (50 mg total) by mouth at bedtime. 30 tablet 3   sertraline (ZOLOFT) 100 MG tablet Take 2 tablets (200 mg total) by mouth at bedtime. 60 tablet 3   traZODone (DESYREL) 100 MG tablet Take 1 tablet (100  mg total) by mouth at bedtime as needed for sleep. 30 tablet 3   fluticasone (FLONASE) 50 MCG/ACT nasal spray Place 2 sprays into both nostrils daily. (Patient not taking: Reported on 08/27/2022) 16 g 0   gabapentin (NEURONTIN) 300 MG capsule TAKE 1 CAPSULE (300 MG TOTAL) BY MOUTH AT BEDTIME. (Patient taking differently: Take 300 mg by mouth as needed (pain).) 30 capsule 3   ipratropium-albuterol (DUONEB) 0.5-2.5 (3) MG/3ML SOLN Take 3 mLs by nebulization every 4 (four) hours as needed. (Patient not taking: Reported on 08/27/2022)     medroxyPROGESTERone (PROVERA) 5 MG tablet Take 2 tablets (10 mg total) by mouth daily. For 15 days for amenorrhea 30 tablet 1   metoprolol tartrate (LOPRESSOR) 25 MG tablet Take 1 tablet (25 mg total) by mouth 2 (two) times daily. (Patient not taking: Reported on 08/27/2022) 180 tablet 3   mupirocin ointment (BACTROBAN) 2 % Apply 1 Application topically 2 (two) times daily. (Patient not taking: Reported on 08/27/2022) 22 g 0   promethazine-dextromethorphan (PROMETHAZINE-DM) 6.25-15 MG/5ML syrup Take 5 mLs by mouth 4 (four) times daily as needed for cough. (Patient not taking: Reported on 07/08/2022) 118 mL 0   No current facility-administered medications for this visit.    Review of Systems Review of Systems  Constitutional:  Negative for unexpected weight change.  Respiratory: Negative.    Cardiovascular: Negative.   Gastrointestinal: Negative.   Genitourinary:  Positive for menstrual problem. Negative for vaginal bleeding and vaginal discharge.    Blood pressure  (!) 159/69, pulse 72, weight (!) 309 lb 1.6 oz (140.2 kg).  Physical Exam Physical Exam Constitutional:      Appearance: She is obese. She is not ill-appearing.  HENT:     Head: Normocephalic and atraumatic.  Cardiovascular:     Rate and Rhythm: Normal rate.  Pulmonary:     Effort: Pulmonary effort is normal.  Genitourinary:    General: Normal vulva.  Musculoskeletal:     Cervical back: Normal range of motion.  Neurological:     Mental Status: She is alert.  Psychiatric:        Mood and Affect: Mood normal.        Behavior: Behavior normal.     Data Reviewed LH, PRL, insulin level, pelvic US results  Assessment Morbid obesity (HCC)  Anxiety and depression  DUB (dysfunctional uterine bleeding)  PCOS (polycystic ovarian syndrome)   Plan She wants cycle control and doesn't want to conceive, declines long-term systemic hormonal management. She wants to have MIrena or Liletta under sedation in OP surgery. Request to schedule was sent     Scheryl Darter 10/01/2022, 10:01 AM

## 2022-10-01 NOTE — Progress Notes (Deleted)
Subjective: heavy periods interested in IUD under sedation    Kristen [name] is a G0P0 Unknown being seen today for her first obstetrical visit.  Her obstetrical history is significant for {ob risk factors:10154}. Patient {does/does not:19097} intend to breast feed. Pregnancy history fully reviewed.  Patient reports {sx:14538}.  Vitals:   10/01/22 0824  BP: (!) 159/69  Pulse: 72  Weight: (!) 309 lb 1.6 oz (140.2 kg)    HISTORY: OB History  Gravida Para Term Preterm AB Living  0            SAB IAB Ectopic Multiple Live Births              Past Medical History:  Diagnosis Date   Anxiety 2013   treated at [facility] by Deatra Robinson    Asthma 1989    no hospitalization, or intubation, previously on advair and singulair. asthma worsening.    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 , dx in utero    Depression 2001   depressed since childhood    Dysrhythmia 2010   PVC's   Essential hypertension 03/12/2019   GERD (gastroesophageal reflux disease)    History of hiatal hernia    History of suicidal ideation    IBS (irritable bowel syndrome)    Kyphosis of thoracic region 2004    painful, runs in dad side    Migraine    Pancreatitis 2003   gallstone pancreatitis    Pancreatitis due to biliary obstruction 2003   PCOS (polycystic ovarian syndrome) 2014   facial hair, irregular periods, never been pregnant    Sleep apnea    no longer uses cpap   Past Surgical History:  Procedure Laterality Date   BIOPSY  06/11/2021   Procedure: BIOPSY;  Surgeon: Napoleon Form, MD;  Location: [facility];  Service: Gastroenterology;;   BLADDER SURGERY     CHOLECYSTECTOMY  2003    COLONOSCOPY WITH PROPOFOL N/A 06/11/2021   Procedure: COLONOSCOPY WITH PROPOFOL;  Surgeon: Napoleon Form, MD;  Location: [facility];  Service: Gastroenterology;  Laterality: N/A;   DILATATION & CURETTAGE/HYSTEROSCOPY WITH MYOSURE N/A 12/14/2019   Procedure: DILATATION & CURETTAGE/HYSTEROSCOPY WITH  Polypectomy with MYOSURE;  Surgeon: Tereso Newcomer, MD;  Location: [facility];  Service: Gynecology;  Laterality: N/A;   OPEN REDUCTION INTERNAL FIXATION (ORIF) PROXIMAL PHALANX Right 07/23/2013   Procedure: OPEN TREATMENT RIGHT RING FINGER, PROXIMAL INTERPHALANGEAL/JOINT FRACTURE DISLOCATION;  Surgeon: Jodi Marble, MD;  Location: [facility];  Service: Orthopedics;  Laterality: Right;   TONSILLECTOMY     and adenoidectomy   Family History  Problem Relation Age of Onset   Hypertension Mother    Prajwal Fellner-Chiari malformation Mother    Restless legs syndrome Mother    Osteoporosis Mother    COPD Mother    Asthma Mother    Squamous cell carcinoma Mother        skin    Eczema Mother    Arthritis Mother    Peripheral Artery Disease Mother    Anxiety disorder Mother    OCD Mother    Colonic polyp Mother    Hypertension Father    Scoliosis Father    Bipolar disorder Father    Congestive Heart Failure Father    COPD Father    Depression Father    Diabetes Maternal Grandmother    Hypertension Maternal Grandmother    Dementia Maternal Grandmother    OCD Maternal Grandmother    Alzheimer's disease Maternal Grandmother  Bone cancer Maternal Grandfather 82       bone marrow    Multiple myeloma Maternal Grandfather    Alcohol abuse Maternal Grandfather    Drug abuse Maternal Grandfather    Cancer Paternal Grandmother        colon   Diabetes Paternal Grandfather    Alcohol abuse Paternal Grandfather    Drug abuse Paternal Grandfather    Dementia Paternal Grandfather    Alcohol abuse Maternal Aunt    Depression Maternal Aunt    Pancreatic cancer Maternal Aunt    Alcohol abuse Paternal Aunt    Depression Paternal Aunt    ADD / ADHD Cousin      Exam    Uterus:     Pelvic Exam:    Perineum: {Exam; anus and perineum:14598}   Vulva: {vulva exam:14487}   Vagina:  {vaginal exam:14498}   pH: ***   Cervix: {cervix exam:14595}   Adnexa: {adnexa:12223}   Bony  Pelvis: {desc; pelvis:14491}  System: Breast:  {Exam; breast:13139::"normal appearance, no masses or tenderness"}   Skin: {pe skin brief ob:314459}    Neurologic: {Exam; neuro:16375}   Extremities: {Exam; extremities:15096}   HEENT {exam; WUJWJ:19147}   Mouth/Teeth {pe mouth simple ob:314450}   Neck {Neck (ob):32092}   Cardiovascular: {HEART EXAM HEM/ONC:21750}   Respiratory:  {Exam; respiratory:5782}   Abdomen: {Exam; abdomen:16834}   Urinary: {exam; urinary female:30845}      Assessment:    Pregnancy: G0P0 Patient Active Problem List   Diagnosis Date Noted   Diarrhea    Chronic diarrhea    Weight gain 11/01/2020   Hirsutism 11/01/2020   Gastroesophageal reflux disease without esophagitis 07/11/2020   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   POTS (postural orthostatic tachycardia syndrome) 06/09/2020   Morbid obesity (HCC) 12/14/2019   Endometrial polyp    Moderate episode of recurrent major depressive disorder (HCC) 11/18/2019   Encounter for autism screening 11/18/2019   Abnormal uterine bleeding (AUB) 11/08/2019   Severe episode of recurrent major depressive disorder, 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   PTSD (post-traumatic stress disorder) 09/20/2019   Irritable bowel syndrome with diarrhea 04/23/2019   Abnormal serum thyroid stimulating hormone (TSH) level 04/23/2019   MDD (major depressive disorder), recurrent severe, 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   OSA on CPAP 03/17/2019   Anxiety and depression 03/12/2019   Chronic migraine with aura 03/12/2019   Essential hypertension 03/12/2019   Mild intermittent asthma without complication 03/12/2019   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 Prenatal vitamins. Problem list reviewed and updated. Genetic Screening discussed {GENETIC SCREENING TEST:22046}: {requests/ordered/declines:14581}.  Ultrasound discussed; fetal survey: {requests/ordered/declines:14581}.  Follow up in {numbers 0-4:31231} weeks. ***% of *** min visit spent on counseling and coordination of care.  ***   Scheryl Darter 10/01/2022

## 2022-10-08 ENCOUNTER — Telehealth: Payer: Self-pay | Admitting: Physician Assistant

## 2022-10-08 NOTE — Telephone Encounter (Signed)
Inbound call from patient stating she has done the stool samples but was not able to do the breath test due to insurance reasons. Patient is requesting a call to discuss what she needs to do to get the test done. Patient is wondering if she needs an appointment as well. Please advise.

## 2022-10-08 NOTE — Telephone Encounter (Signed)
Patient cannot afford the breath test. Her insurance will not cover the test. She continues to have her "usual" diarrhea. She did have a virus a month ago and endorses recovery from that. She is passing her "normal" stool of yellow and feels "tired" and unsettled. She has seen "black like pepper" in her stool. Endorses taking Pepto-Bismol yesterday.

## 2022-10-09 ENCOUNTER — Other Ambulatory Visit: Payer: Self-pay

## 2022-10-09 MED ORDER — RIFAXIMIN 550 MG PO TABS
550.0000 mg | ORAL_TABLET | Freq: Three times a day (TID) | ORAL | 0 refills | Status: AC
  Filled 2022-10-09: qty 42, 14d supply, fill #0

## 2022-10-09 NOTE — Telephone Encounter (Signed)
Patient advised. Prescription sent to her pharmacy.

## 2022-10-10 ENCOUNTER — Other Ambulatory Visit: Payer: Self-pay

## 2022-10-10 ENCOUNTER — Telehealth: Payer: Self-pay | Admitting: Pharmacy Technician

## 2022-10-10 ENCOUNTER — Other Ambulatory Visit (HOSPITAL_COMMUNITY): Payer: Self-pay

## 2022-10-10 NOTE — Telephone Encounter (Signed)
Pharmacy Patient Advocate Encounter   Received notification from CoverMyMeds that prior authorization for XIFAXAN 550MG  is required/requested.   Insurance verification completed.   The patient is insured through [facility] .   Per test claim: PA required; PA submitted to [employer] via CoverMyMeds Key/confirmation #/EOC [id number] Status is pending

## 2022-10-11 ENCOUNTER — Other Ambulatory Visit (HOSPITAL_COMMUNITY): Payer: Self-pay

## 2022-10-11 ENCOUNTER — Other Ambulatory Visit: Payer: Self-pay

## 2022-10-11 NOTE — Telephone Encounter (Signed)
Patient notified

## 2022-10-11 NOTE — Telephone Encounter (Signed)
Pharmacy Patient Advocate Encounter  Received notification from [facility] that Prior Authorization for XIFAXAN 550MG  has been APPROVED from 9.6.25 to 9.6.25. Ran test claim, Copay is $4. This test claim was processed through [facility]- copay amounts may vary at other pharmacies due to pharmacy/plan contracts, or as the patient moves through the different stages of their insurance plan.   PA #/Case ID/Reference #:

## 2022-10-14 ENCOUNTER — Other Ambulatory Visit (HOSPITAL_COMMUNITY): Payer: Self-pay

## 2022-10-14 ENCOUNTER — Other Ambulatory Visit: Payer: Self-pay

## 2022-10-15 ENCOUNTER — Telehealth: Payer: Self-pay | Admitting: Physician Assistant

## 2022-10-15 NOTE — Telephone Encounter (Signed)
Patient sent a MyChart message requesting an appointment with [name].  LVM for her to call and schedule.  In the meantime, she's been having issues the last few weeks passing black flecks in her stool that are reminiscent of coffee grounds or black pepper.  However, her stools are still the same yellow with a watery or greasy consistency.  Please advise.  Thank you.

## 2022-10-16 ENCOUNTER — Ambulatory Visit: Payer: MEDICAID | Attending: Urology | Admitting: Physical Therapy

## 2022-10-16 ENCOUNTER — Other Ambulatory Visit: Payer: Self-pay

## 2022-10-16 DIAGNOSIS — R293 Abnormal posture: Secondary | ICD-10-CM | POA: Insufficient documentation

## 2022-10-16 DIAGNOSIS — R351 Nocturia: Secondary | ICD-10-CM | POA: Insufficient documentation

## 2022-10-16 DIAGNOSIS — R279 Unspecified lack of coordination: Secondary | ICD-10-CM | POA: Diagnosis not present

## 2022-10-16 DIAGNOSIS — M6281 Muscle weakness (generalized): Secondary | ICD-10-CM | POA: Diagnosis not present

## 2022-10-16 DIAGNOSIS — N3946 Mixed incontinence: Secondary | ICD-10-CM | POA: Diagnosis present

## 2022-10-16 DIAGNOSIS — R350 Frequency of micturition: Secondary | ICD-10-CM | POA: Insufficient documentation

## 2022-10-16 NOTE — Telephone Encounter (Signed)
Called the patient. No answer. Left a message of my call.

## 2022-10-16 NOTE — Therapy (Signed)
OUTPATIENT PHYSICAL THERAPY FEMALE PELVIC EVALUATION   Patient Name: Kristen [name] MRN: [id number] DOB:[birth year], 37 y.o., female Today's Date: 10/16/2022  END OF SESSION:  PT End of Session - 10/16/22 1533     Visit Number 1    Date for PT Re-Evaluation 02/15/23    Authorization Type [employer]    PT Start Time 1530    PT Stop Time 1600    PT Time Calculation (min) 30 min             Past Medical History:  Diagnosis Date   Anxiety 2013   treated at [facility] by Deatra Robinson    Asthma 1989    no hospitalization, or intubation, previously on advair and singulair. asthma worsening.    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 , dx in utero    Depression 2001   depressed since childhood    Dysrhythmia 2010   PVC's   Essential hypertension 03/12/2019   GERD (gastroesophageal reflux disease)    History of hiatal hernia    History of suicidal ideation    IBS (irritable bowel syndrome)    Kyphosis of thoracic region 2004    painful, runs in dad side    Migraine    Pancreatitis 2003   gallstone pancreatitis    Pancreatitis due to biliary obstruction 2003   PCOS (polycystic ovarian syndrome) 2014   facial hair, irregular periods, never been pregnant    Sleep apnea    no longer uses cpap   Past Surgical History:  Procedure Laterality Date   BIOPSY  06/11/2021   Procedure: BIOPSY;  Surgeon: Napoleon Form, MD;  Location: [facility];  Service: Gastroenterology;;   BLADDER SURGERY     CHOLECYSTECTOMY  2003    COLONOSCOPY WITH PROPOFOL N/A 06/11/2021   Procedure: COLONOSCOPY WITH PROPOFOL;  Surgeon: Napoleon Form, MD;  Location: [facility];  Service: Gastroenterology;  Laterality: N/A;   DILATATION & CURETTAGE/HYSTEROSCOPY WITH MYOSURE N/A 12/14/2019   Procedure: DILATATION & CURETTAGE/HYSTEROSCOPY WITH Polypectomy with MYOSURE;  Surgeon: Tereso Newcomer, MD;  Location: [facility];  Service: Gynecology;  Laterality: N/A;   OPEN REDUCTION INTERNAL FIXATION  (ORIF) PROXIMAL PHALANX Right 07/23/2013   Procedure: OPEN TREATMENT RIGHT RING FINGER, PROXIMAL INTERPHALANGEAL/JOINT FRACTURE DISLOCATION;  Surgeon: Jodi Marble, MD;  Location: [facility];  Service: Orthopedics;  Laterality: Right;   TONSILLECTOMY     and adenoidectomy   Patient Active Problem List   Diagnosis Date Noted   Diarrhea    Chronic diarrhea    Weight gain 11/01/2020   Hirsutism 11/01/2020   Gastroesophageal reflux disease without esophagitis 07/11/2020   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   POTS (postural orthostatic tachycardia syndrome) 06/09/2020   Morbid obesity (HCC) 12/14/2019   Endometrial polyp    Moderate episode of recurrent major depressive disorder (HCC) 11/18/2019   Encounter for autism screening 11/18/2019   DUB (dysfunctional uterine bleeding) 11/08/2019   Severe episode of recurrent major depressive disorder, 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   PTSD (post-traumatic stress disorder) 09/20/2019   Irritable bowel syndrome with diarrhea 04/23/2019   Abnormal serum thyroid stimulating hormone (TSH) level 04/23/2019   MDD (major depressive disorder), recurrent severe, 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   OSA on CPAP 03/17/2019   Anxiety and depression 03/12/2019   Chronic migraine with aura 03/12/2019   Essential hypertension 03/12/2019   Mild intermittent asthma without complication 03/12/2019   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   Elevated prolactin level 07/05/2016  Vulvar lesion 07/05/2016   Restless legs 05/24/2016   Seasonal allergies 05/13/2014   Intertrigo 05/13/2014   Decreased vision 05/13/2014   PCOS (polycystic ovarian syndrome) 02/07/2014   Social phobia 02/02/2007   ADD 02/02/2007   DYSLEXIA 02/02/2007   Asthma, severe persistent 02/02/2007   SCOLIOSIS 02/02/2007   OTHER SPECIFIED CONGENITAL ANOMALIES OF KIDNEY 02/02/2007   ELECTROCARDIOGRAM, ABNORMAL 02/02/2007    PCP: Jonah Blue, MD  REFERRING  PROVIDER: Alfredo Martinez, MD  REFERRING DIAG: N39.46 (ICD-10-CM) - Mixed incontinence  THERAPY DIAG:  Muscle weakness (generalized)  Abnormal posture  Unspecified lack of coordination  Rationale for Evaluation and Treatment: Rehabilitation  ONSET DATE: 1-2 years ago  SUBJECTIVE:                                                                                                                                                                                           SUBJECTIVE STATEMENT: Has global pain with EDS- has increased pain "subluxing something". Has just stated antibiotics and having gut pain today.  Does have urinary leakage with sneezing/laughing/coughing, bending. 4 pad changes daily and I wear them constants fear of starting period as this is irregular and with leakage with stress. Reports skin irritation due to wearing a lot and reports having 3 cysts in thighs/labia sometimes gets them around under arms and under breast tissue.   Fluid intake: Yes: water - 4 large glasses a day, Gatorade 1-2x daily, tea 1-2 glasses daily     PAIN:  Are you having pain? Yes NPRS scale: 10/10 at worst with subluxing, and 4/10 at best, 6/10 now  Pain location:  global joint pain  Pain type: aching and dull Pain description: constant   Aggravating factors: periods, increased mobility, extreme heat Relieving factors: rest, OTC meds, warm shower  PRECAUTIONS: Other: POTS, Falls with ankles subluxing   RED FLAGS: None   WEIGHT BEARING RESTRICTIONS: No  FALLS:  Has patient fallen in last 6 months? Yes. Number of falls 10  LIVING ENVIRONMENT: Lives with: lives with their family Lives in: House/apartment   OCCUPATION: not currently  PLOF: Independent  PATIENT GOALS: to not pee myself  PERTINENT HISTORY:  Anxiety, Depression, hiatal hernia, IBS , PCOS,  CHOLECYSTECTOMY, DILATATION & CURETTAGE/HYSTEROSCOPY, Hirsutism, Ehlers-Danlos disease, obesity,  POTS,  Endometrial polyp,  DUB, Vulvar lesion,   Sexual abuse: No  BOWEL MOVEMENT: Pain with bowel movement: Yes - cramping to empty, hurts before, during and after and fatigued after with stomach pain. Takes 20 mins - 1 hour, wipes several times to get clean. Type of bowel movement:Type (Bristol Stool Scale) type 7-6, Frequency  4-10+x daily, and Strain  No Fully empty rectum: Yes:   Leakage: Yes: with urgency could be small amount to full loss Pads: Yes: only with urine leakage, very rare to leak stool Fiber supplement: No   URINATION: Pain with urination: No Fully empty bladder: Yes:   Stream: Strong Urgency: Yes:   Frequency: sometimes quicker than every 2 hours but not always, 1-2x nightly Leakage: Urge to void, Coughing, Sneezing, Laughing, Exercise, Lifting, and Bending forward Pads: Yes: 0-5 pads   INTERCOURSE: Pain with intercourse: Initial Penetration, During Penetration, and After Intercourse Ability to have vaginal penetration:  Yes: reports dryness with intercourse Climax: not painful Marinoff Scale: 1/3  PREGNANCY: Vaginal deliveries 0 Tearing na C-section deliveries 0 Currently pregnant No  PROLAPSE: None   OBJECTIVE:   DIAGNOSTIC FINDINGS:  PATIENT SURVEYS:    [id number]  COGNITION: Overall cognitive status: Within functional limits for tasks assessed     SENSATION: Light touch: Appears intact Proprioception: Appears intact  MUSCLE LENGTH: hypermobile  LUMBAR SPECIAL TESTS:  Straight leg raise test: lumbar arching noted and Single leg stance test: bil hip drop noted <5s  FUNCTIONAL TESTS:  Functional squat with UE support at knees to return to standing, bil valgus knees with return to standing, ant 50% descent   GAIT: WFL  POSTURE: rounded shoulders, forward head, increased thoracic kyphosis, anterior pelvic tilt, and also sits with bil arms behind back to help support large breast tissue    LUMBARAROM/PROM:  A/PROM A/PROM  eval  Flexion WFL  Extension WFL   Right lateral flexion WFL  Left lateral flexion WFL  Right rotation WFL  Left rotation WFL   (Blank rows = not tested)  LOWER EXTREMITY ROM:  WFL  LOWER EXTREMITY MMT:  Bil hips grossly 3+/; knees 4+/5  PALPATION:   General  TTP in lumbar paraspinals and thoracic paraspinals                External Perineal Exam deferred until next session                             Internal Pelvic Floor deferred until next session   Patient confirms identification and approves PT to assess internal pelvic floor and treatment No  PELVIC MMT:   MMT eval  Vaginal   Internal Anal Sphincter   External Anal Sphincter   Puborectalis   Diastasis Recti   (Blank rows = not tested)        TONE: Deferred until next session   PROLAPSE: Deferred until next session   TODAY'S TREATMENT:                                                                                                                              DATE:   10/16/22 EVAL Examination completed, findings reviewed, pt educated on POC, knack and abdominal massage. Pt motivated to participate in PT and agreeable to attempt recommendations.  If treatment provided at initial evaluation, no treatment charged due to lack of authorization.      PATIENT EDUCATION:  Education details: knack and abdominal massage Person educated: Patient Education method: Explanation, Demonstration, Tactile cues, Verbal cues, and Handouts Education comprehension: verbalized understanding and returned demonstration  HOME EXERCISE PROGRAM: To be given  ASSESSMENT:  CLINICAL IMPRESSION: Patient is a 37 y.o. female  who was seen today for physical therapy evaluation and treatment for global pain, increased urine frequency, increased urgency, urinary leakage and irregular bowel habits with fecal leakage rarely but does happen. Pt does have complex history of Anxiety, Depression, hiatal hernia, IBS , PCOS,  CHOLECYSTECTOMY, DILATATION &  CURETTAGE/HYSTEROSCOPY, Hirsutism, Ehlers-Danlos disease, obesity,  POTS,  Endometrial polyp, DUB, Vulvar lesion,  and chronic pain. Pt Pt would benefit from additional PT to further address deficits.    OBJECTIVE IMPAIRMENTS: decreased coordination, decreased endurance, decreased mobility, decreased strength, increased fascial restrictions, increased muscle spasms, impaired flexibility, impaired tone, improper body mechanics, postural dysfunction, and pain.   ACTIVITY LIMITATIONS: carrying, lifting, bending, standing, squatting, and continence  PARTICIPATION LIMITATIONS: community activity  PERSONAL FACTORS: Time since onset of injury/illness/exacerbation and 1 comorbidity: medical history  are also affecting patient's functional outcome.   REHAB POTENTIAL: Good  CLINICAL DECISION MAKING: Evolving/moderate complexity  EVALUATION COMPLEXITY: Moderate   GOALS: Goals reviewed with patient? Yes  SHORT TERM GOALS: Target date: 11/13/22  Pt to be I with HEP.  Baseline: Goal status: INITIAL  2.  Pt to demonstrate at least 3/5 pelvic floor strength for improved pelvic stability and decreased strain at pelvic floor/ decrease leakage.   Baseline:  Goal status: INITIAL  3.  Pt to be I with breathing and voiding mechanics and abdominal massage for improved bowel habits.  Baseline:  Goal status: INITIAL  4.  Pt to be I with urge drill.  Baseline:  Goal status: INITIAL   LONG TERM GOALS: Target date: 02/15/23  Pt to be I with advanced HEP.  Baseline:  Goal status: INITIAL  2.  Pt to demonstrate at least 4/5 pelvic floor strength for improved pelvic stability and decreased strain at pelvic floor/ decrease leakage.  Baseline:  Goal status: INITIAL  3.  Pt to demonstrate at least 5/5 bil hip strength for improved pelvic stability and functional squats without leakage.  Baseline:  Goal status: INITIAL  4.  Pt will have 25% less urgency due to bladder retraining and strengthening   Baseline:  Goal status: INITIAL  5.  Pt will report her BMs are complete due to improved bowel habits and evacuation techniques.  Baseline:  Goal status: INITIAL  PLAN:  PT FREQUENCY: 1x/week  PT DURATION:  10 sessions  PLANNED INTERVENTIONS: Therapeutic exercises, Therapeutic activity, Neuromuscular re-education, Patient/Family education, Self Care, Joint mobilization, Dry Needling, Cryotherapy, Moist heat, scar mobilization, Taping, Vasopneumatic device, Biofeedback, Manual therapy, and Re-evaluation  PLAN FOR NEXT SESSION: internal is pt consents, strengthening hip/core, coordination of pelvic floor    Otelia Sergeant, PT, DPT 09/11/245:17 PM

## 2022-10-17 NOTE — Telephone Encounter (Signed)
Patient is returning your call.  

## 2022-10-18 ENCOUNTER — Other Ambulatory Visit: Payer: Self-pay

## 2022-10-18 NOTE — Telephone Encounter (Signed)
Spoke with the patient. She thinks she may have started to see some improvement, but not as much as she had hoped. She will continue and complete the Xifaxan. She needs to have a follow up appointment with Mike Gip, PA. I will try to get her an appointment first available when one opens up.

## 2022-11-05 HISTORY — PX: MOHS SURGERY: SHX181

## 2022-11-15 ENCOUNTER — Telehealth: Payer: Self-pay

## 2022-11-15 NOTE — Telephone Encounter (Signed)
Left voicemail letting patient know she is scheduled for surgery w/ Dr. Debroah Loop on 01/21/23 @7 :30 am at [facility]. Advised patient would need to arrive at [facility] @5 :30 am for pre-op. Asked patient to call me if the date does not work for her schedule. [phone].

## 2022-11-20 ENCOUNTER — Other Ambulatory Visit: Payer: Self-pay

## 2022-11-20 ENCOUNTER — Encounter (HOSPITAL_COMMUNITY): Payer: Self-pay | Admitting: Psychiatry

## 2022-11-20 ENCOUNTER — Telehealth (INDEPENDENT_AMBULATORY_CARE_PROVIDER_SITE_OTHER): Payer: MEDICAID | Admitting: Psychiatry

## 2022-11-20 DIAGNOSIS — F331 Major depressive disorder, recurrent, moderate: Secondary | ICD-10-CM | POA: Diagnosis not present

## 2022-11-20 DIAGNOSIS — F4010 Social phobia, unspecified: Secondary | ICD-10-CM | POA: Diagnosis not present

## 2022-11-20 MED ORDER — QUETIAPINE FUMARATE 50 MG PO TABS
50.0000 mg | ORAL_TABLET | Freq: Every day | ORAL | 3 refills | Status: DC
Start: 2022-11-20 — End: 2023-01-16
  Filled 2022-11-20: qty 30, 30d supply, fill #0

## 2022-11-20 MED ORDER — SERTRALINE HCL 100 MG PO TABS
200.0000 mg | ORAL_TABLET | Freq: Every day | ORAL | 3 refills | Status: DC
Start: 2022-11-20 — End: 2023-01-16
  Filled 2022-11-20: qty 60, 30d supply, fill #0

## 2022-11-20 MED ORDER — TRAZODONE HCL 100 MG PO TABS
100.0000 mg | ORAL_TABLET | Freq: Every evening | ORAL | 3 refills | Status: DC | PRN
  Filled 2022-11-20: qty 30, 30d supply, fill #0

## 2022-11-20 MED ORDER — BUSPIRONE HCL 10 MG PO TABS
10.0000 mg | ORAL_TABLET | Freq: Three times a day (TID) | ORAL | 3 refills | Status: DC
Start: 2022-11-20 — End: 2023-01-16
  Filled 2022-11-20: qty 90, 30d supply, fill #0

## 2022-11-20 MED ORDER — HYDROXYZINE HCL 25 MG PO TABS
25.0000 mg | ORAL_TABLET | Freq: Four times a day (QID) | ORAL | 3 refills | Status: DC
  Filled 2022-11-20: qty 120, 30d supply, fill #0

## 2022-11-20 NOTE — Progress Notes (Signed)
BH MD/PA/NP OP Progress Note Virtual Visit via Video Note  I connected with Kristen [name] on 11/20/22 at  9:00 AM EDT by a video enabled telemedicine application and verified that I am speaking with the correct person using two identifiers.  Location: Patient: Home Provider: Clinic   I discussed the limitations of evaluation and management by telemedicine and the availability of in person appointments. The patient expressed understanding and agreed to proceed.  I provided 30 minutes of non-face-to-face time during this encounter.    11/20/2022 9:22 AM Kristen [name]  MRN:  [id number]  Chief Complaint: "The meds are working but life is rough"  HPI:  37 year old female seen today for follow up psychiatric evaluation.  She has a psychiatric history of social phobia, ADD, anxiety, depression, and PTSD.  She is currently being managed on Seroquel  50 mg nightly, Buspar 10 mg three times daily,  Zoloft 200 mg daily, trazodone 50 mg nightly as needed, hydroxyzine 25 mg 4 times a day as needed, gabapentin 300 mg nightly (prescribed by PCP), and propanolol 20 mg 2 times daily (recives from PCP).  She notes that her medications are effective in managing her psychiatric conditions.   Today patient was well-groomed, pleasant, cooperative, engaged in conversation, and maintained eye contact.  She informed provider that her medications are effective but notes that life is rough. She notes that her mother recently had surgery and she is helping care for her. She also notes that financially she is not in a good place. Patient also notes that her boyfriends mother and her does not get along.  Despite the above patient notes that her anxiety and depression are well managed.  Today provider conducted a GAD 7  and patient scored an 13 , at her last visit she scored an 16.  Provider also conducted PHQ-9 and patient scored a 17, at her last visit she scored a 12.  She endorses adequate appetite.  She notes that at times her sleep fluctuates. Today she denies SI/HI/VAH, mania, or paranoia.  Patient notes that her IBS continues to be problematic. Recently she notes that she has a flare up and was given antibodies. Patient notes that she is on the [employer] Which is somewhat effective.   Today no medication changes made today patient agreeable to continue medications as prescribed.  No other concerns noted at this time.      Visit Diagnosis:    ICD-10-CM   1. Moderate episode of recurrent major depressive disorder (HCC)  F33.1 busPIRone (BUSPAR) 10 MG tablet    QUEtiapine (SEROQUEL) 50 MG tablet    sertraline (ZOLOFT) 100 MG tablet    traZODone (DESYREL) 100 MG tablet    2. Social phobia  F40.10 busPIRone (BUSPAR) 10 MG tablet    hydrOXYzine (ATARAX) 25 MG tablet    sertraline (ZOLOFT) 100 MG tablet    traZODone (DESYREL) 100 MG tablet        Past Psychiatric History: social phobia, ADD, anxiety, depression, and PTSD.  Past Medical History:  Past Medical History:  Diagnosis Date   Anxiety 2013   treated at [facility] by Deatra Robinson    Asthma 1989    no hospitalization, or intubation, previously on advair and singulair. asthma worsening.    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 , dx in utero    Depression 2001   depressed since childhood    Dysrhythmia 2010   PVC's   Essential hypertension 03/12/2019  GERD (gastroesophageal reflux disease)    History of hiatal hernia    History of suicidal ideation    IBS (irritable bowel syndrome)    Kyphosis of thoracic region 2004    painful, runs in dad side    Migraine    Pancreatitis 2003   gallstone pancreatitis    Pancreatitis due to biliary obstruction 2003   PCOS (polycystic ovarian syndrome) 2014   facial hair, irregular periods, never been pregnant    Sleep apnea    no longer uses cpap    Past Surgical History:  Procedure Laterality Date   BIOPSY  06/11/2021   Procedure: BIOPSY;  Surgeon: Napoleon Form,  MD;  Location: [facility];  Service: Gastroenterology;;   BLADDER SURGERY     CHOLECYSTECTOMY  2003    COLONOSCOPY WITH PROPOFOL N/A 06/11/2021   Procedure: COLONOSCOPY WITH PROPOFOL;  Surgeon: Napoleon Form, MD;  Location: [facility];  Service: Gastroenterology;  Laterality: N/A;   DILATATION & CURETTAGE/HYSTEROSCOPY WITH MYOSURE N/A 12/14/2019   Procedure: DILATATION & CURETTAGE/HYSTEROSCOPY WITH Polypectomy with MYOSURE;  Surgeon: Tereso Newcomer, MD;  Location: [facility];  Service: Gynecology;  Laterality: N/A;   OPEN REDUCTION INTERNAL FIXATION (ORIF) PROXIMAL PHALANX Right 07/23/2013   Procedure: OPEN TREATMENT RIGHT RING FINGER, PROXIMAL INTERPHALANGEAL/JOINT FRACTURE DISLOCATION;  Surgeon: Jodi Marble, MD;  Location: [facility];  Service: Orthopedics;  Laterality: Right;   TONSILLECTOMY     and adenoidectomy    Family Psychiatric History: Mother PTSD, Father Bipolar disorder and PTSD  Family History:  Family History  Problem Relation Age of Onset   Hypertension Mother    Arnold-Chiari malformation Mother    Restless legs syndrome Mother    Osteoporosis Mother    COPD Mother    Asthma Mother    Squamous cell carcinoma Mother        skin    Eczema Mother    Arthritis Mother    Peripheral Artery Disease Mother    Anxiety disorder Mother    OCD Mother    Colonic polyp Mother    Hypertension Father    Scoliosis Father    Bipolar disorder Father    Congestive Heart Failure Father    COPD Father    Depression Father    Diabetes Maternal Grandmother    Hypertension Maternal Grandmother    Dementia Maternal Grandmother    OCD Maternal Grandmother    Alzheimer's disease Maternal Grandmother    Bone cancer Maternal Grandfather 61       bone marrow    Multiple myeloma Maternal Grandfather    Alcohol abuse Maternal Grandfather    Drug abuse Maternal Grandfather    Cancer Paternal Grandmother        colon   Diabetes Paternal Grandfather    Alcohol  abuse Paternal Grandfather    Drug abuse Paternal Grandfather    Dementia Paternal Grandfather    Alcohol abuse Maternal Aunt    Depression Maternal Aunt    Pancreatic cancer Maternal Aunt    Alcohol abuse Paternal Aunt    Depression Paternal Aunt    ADD / ADHD Cousin     Social History:  Social History   Socioeconomic History   Marital status: Single    Spouse name: Not on file   Number of children: 0   Years of education: college    Highest education level: Not on file  Occupational History   Occupation: Unemployed   Tobacco Use   Smoking status: Never  Smokeless tobacco: Never  Vaping Use   Vaping status: Never Used  Substance and Sexual Activity   Alcohol use: No   Drug use: No   Sexual activity: Yes    Birth control/protection: None  Other Topics Concern   Not on file  Social History Narrative   Lives with mom Steward Drone)    Right-handed   Caffeine: 3 cups of coffee per day   Social Determinants of Health   Financial Resource Strain: Not on file  Food Insecurity: Food Insecurity Present (08/17/2020)   Hunger Vital Sign    Worried About Running Out of Food in the Last Year: Often true    Ran Out of Food in the Last Year: Sometimes true  Transportation Needs: No Transportation Needs (08/17/2020)   PRAPARE - Administrator, Civil Service (Medical): No    Lack of Transportation (Non-Medical): No  Physical Activity: Not on file  Stress: Not on file  Social Connections: Not on file    Allergies:  Allergies  Allergen Reactions   Mucinex [Guaifenesin Er] Hives, Itching and Swelling   Penicillins Swelling    Did it involve swelling of the face/tongue/throat, SOB, or low BP? Yes Did it involve sudden or severe rash/hives, skin peeling, or any reaction on the inside of your mouth or nose? No Did you need to seek medical attention at a hospital or doctor's office? Yes When did it last happen?      >10 years ago If all above answers are "NO", may proceed  with cephalosporin use.    Contrast Media [Iodinated Contrast Media] Nausea And Vomiting   Latex Hives and Swelling   Multihance [Gadobenate] Nausea And Vomiting   Other     Pickles - throat swelling and nausea     Metabolic Disorder Labs: Lab Results  Component Value Date   HGBA1C 5.2 06/18/2022   MPG 93.93 04/15/2019   Lab Results  Component Value Date   PROLACTIN 10.7 06/19/2022   PROLACTIN 15.3 11/01/2020   Lab Results  Component Value Date   CHOL 177 06/18/2022   TRIG 128.0 06/18/2022   HDL 42.40 06/18/2022   CHOLHDL 4 06/18/2022   VLDL 25.6 06/18/2022   LDLCALC 109 (H) 06/18/2022   LDLCALC 134 (H) 04/15/2019   Lab Results  Component Value Date   TSH 3.01 06/18/2022   TSH 2.99 11/01/2020    Therapeutic Level Labs: No results found for: "LITHIUM" No results found for: "VALPROATE" No results found for: "CBMZ"  Current Medications: Current Outpatient Medications  Medication Sig Dispense Refill   albuterol (VENTOLIN HFA) 108 (90 Base) MCG/ACT inhaler Inhale 2 puffs into the lungs every 6 (six) hours as needed for wheezing or shortness of breath. 18 g 1   Benzocaine-Resorcinol (VAGISIL EX) Apply 1 application. topically as needed.     busPIRone (BUSPAR) 10 MG tablet Take 1 tablet (10 mg total) by mouth 3 (three) times daily. 90 tablet 3   cholestyramine (QUESTRAN) 4 g packet Take 1 packet twice daily between meals 60 each 6   diclofenac Sodium (VOLTAREN) 1 % GEL Apply 2 g topically 4 (four) times daily. (Patient taking differently: Apply 2 g topically as needed.) 100 g 0   fluticasone (FLONASE) 50 MCG/ACT nasal spray Place 2 sprays into both nostrils daily. 16 g 0   gabapentin (NEURONTIN) 300 MG capsule TAKE 1 CAPSULE (300 MG TOTAL) BY MOUTH AT BEDTIME. (Patient taking differently: Take 300 mg by mouth as needed (pain).) 30 capsule 3  hydrOXYzine (ATARAX) 25 MG tablet Take 1 tablet (25 mg total) by mouth 4 (four) times daily. 120 tablet 3   ipratropium-albuterol  (DUONEB) 0.5-2.5 (3) MG/3ML SOLN Take 3 mLs by nebulization every 4 (four) hours as needed.     loperamide (IMODIUM A-D) 2 MG tablet Take 2 mg by mouth 4 (four) times daily as needed for diarrhea or loose stools.     medroxyPROGESTERone (PROVERA) 5 MG tablet Take 2 tablets (10 mg total) by mouth daily. For 15 days for amenorrhea 30 tablet 1   metoprolol tartrate (LOPRESSOR) 25 MG tablet Take 1 tablet (25 mg total) by mouth 2 (two) times daily. 180 tablet 3   mupirocin ointment (BACTROBAN) 2 % Apply 1 Application topically 2 (two) times daily. 22 g 0   naproxen (NAPROSYN) 375 MG tablet Take 1 tablet (375 mg total) by mouth 2 (two) times daily with a meal. 20 tablet 0   omeprazole (PRILOSEC) 20 MG capsule Take 1 capsule (20 mg total) by mouth daily. 30 capsule 3   ondansetron (ZOFRAN-ODT) 4 MG disintegrating tablet Take 1 tablet (4 mg total) by mouth every 8 (eight) hours as needed for nausea or vomiting. 20 tablet 0   promethazine-dextromethorphan (PROMETHAZINE-DM) 6.25-15 MG/5ML syrup Take 5 mLs by mouth 4 (four) times daily as needed for cough. 118 mL 0   propranolol (INDERAL) 10 MG tablet Take 2 tablets (20 mg total) by mouth 3 (three) times daily as needed (palpitations). 60 tablet 11   QUEtiapine (SEROQUEL) 50 MG tablet Take 1 tablet (50 mg total) by mouth at bedtime. 30 tablet 3   sertraline (ZOLOFT) 100 MG tablet Take 2 tablets (200 mg total) by mouth at bedtime. 60 tablet 3   traZODone (DESYREL) 100 MG tablet Take 1 tablet (100 mg total) by mouth at bedtime as needed for sleep. 30 tablet 3   No current facility-administered medications for this visit.     Musculoskeletal: Strength & Muscle Tone: within normal limits and telehealth visit Gait & Station: normal, telehealth visit Patient leans: N/A  Psychiatric Specialty Exam: Review of Systems  There were no vitals taken for this visit.There is no height or weight on file to calculate BMI.  General Appearance: Well Groomed  Eye  Contact:  Good  Speech:  Clear and Coherent and Normal Rate  Volume:  Normal  Mood:  Anxious  Affect:  Congruent  Thought Process:  Coherent, Goal Directed and Linear  Orientation:  Full (Time, Place, and Person)  Thought Content: WDL and Logical   Suicidal Thoughts:  No  Homicidal Thoughts:  No  Memory:  Immediate;   Good Recent;   Good Remote;   Good  Judgement:  Good  Insight:  Good  Psychomotor Activity:  Normal  Concentration:  Concentration: Good and Attention Span: Good  Recall:  Good  Fund of Knowledge: Good  Language: Good  Akathisia:  No  Handed:  Right  AIMS (if indicated): Not done  Assets:  Communication Skills Desire for Improvement Financial Resources/Insurance Housing Social Support  ADL's:  Intact  Cognition: WNL  Sleep:  Fair   Screenings: AIMS    Flowsheet Row [facility] (Discharged[facility]  AIMS Total Score 0      AUDIT    Flowsheet Row Admission (Discharged) [facility]  Alcohol Use Disorder Identification Test Final Score (AUDIT) 0      GAD-7  Flowsheet Row Video Visit from 11/20/2022 in [facility] Office Visit from 10/01/2022 in [facility] Office Visit from 08/27/2022 in [facility] Video Visit from 08/21/2022 in [facility] Video Visit from 06/12/2022 in [facility]  Total GAD-7 Score 13 13 15 16 8       PHQ2-9    Flowsheet Row Video Visit from 11/20/2022 in [facility] Office Visit from 10/01/2022 in Center for [facility] Office Visit from 08/27/2022 in [facility] Video Visit from 08/21/2022 in  [facility] Video Visit from 06/12/2022 in [facility]  PHQ-2 Total Score 4 4 5 2 3   PHQ-9 Total Score 17 13 14 12 12       Flowsheet Row ED from 09/13/2022 in [facility] Video Visit from 04/03/2022 in [facility] Video Visit from 07/23/2021 in [facility]  C-SSRS RISK CATEGORY No Risk Error: Q7 should not be populated when Q6 is No Error: Q7 should not be populated when Q6 is No        Assessment and Plan: Patient notes that her medications are effective but notes that life is rough. She however notes that she is able to cope. No medication changes made today. Patient agreeable to continue medications as prescribed.   1. Social phobia  Continue- busPIRone (BUSPAR) 10 MG tablet; Take 1 tablet (10 mg total) by mouth 3 (three) times daily.  Dispense: 90 tablet; Refill: 3 Continue- hydrOXYzine (ATARAX) 25 MG tablet; Take 1 tablet (25 mg total) by mouth 4 (four) times daily.  Dispense: 120 tablet; Refill: 3 Continue- sertraline (ZOLOFT) 100 MG tablet; Take 2 tablets (200 mg total) by mouth at bedtime.  Dispense: 60 tablet; Refill: 3 Continue- traZODone (DESYREL) 100 MG tablet; Take 1 tablet (100 mg total) by mouth at bedtime as needed for sleep.  Dispense: 30 tablet; Refill: 3  2. Moderate episode of recurrent major depressive disorder (HCC)  Continue- busPIRone (BUSPAR) 10 MG tablet; Take 1 tablet (10 mg total) by mouth 3 (three) times daily.  Dispense: 90 tablet; Refill: 3 Continue- QUEtiapine (SEROQUEL) 50 MG tablet; Take 1 tablet (50 mg total) by mouth at bedtime.  Dispense: 30 tablet; Refill: 3 Continue- sertraline (ZOLOFT) 100 MG tablet; Take 2 tablets (200 mg total) by mouth at bedtime.  Dispense: 60 tablet; Refill: 3 Continue- traZODone (DESYREL) 100 MG tablet; Take 1 tablet (100 mg total) by mouth at bedtime as needed for sleep.   Dispense: 30 tablet; Refill: 3   Follow-up in 3 months Follow-up therapy Shanna Cisco, NP 11/20/2022, 9:22 AM

## 2022-11-29 ENCOUNTER — Ambulatory Visit: Payer: Self-pay | Admitting: *Deleted

## 2022-11-29 NOTE — Telephone Encounter (Signed)
Message from [place] E sent at 11/29/2022 11:08 AM EDT  Summary: Swelling in ear   Swelling in ear, requesting a cleaning Best contact: [phone]          Call History  Contact Date/Time Type Contact Phone/Fax User  11/29/2022 11:07 AM EDT Phone (Incoming) Kristen, [name] (Self) [phone] Judie Petit) Randol Kern   Reason for Disposition  Pus from the ear canal (e.g., yellow or green discharge)  Answer Assessment - Initial Assessment Questions 1. LOCATION: "Which ear is involved?"      My right ear 2. SYMPTOMS: "What are the main symptoms?" (e.g., fullness, decreased hearing, itching, discomfort)     I'm draining stuff since Friday.  It's clear.   When I clean it it's yellow.  It's itching  It feels like it needs to pop.  3. ONSET: "When did the  swelling  start?"     It's swollen where the hole is. 4. PAIN: "Is there any earache?" "How bad is it?"  (Scale 1-10; or mild, moderate, severe)     Slightly when touched 5. OBJECTS: "Do you use cotton swabs (Q-tips) in your ear?" "Have you put anything else in your ear?"     Just in the outer canall 6. EARWAX HISTORY: "Have you had problems with earwax before?" If Yes, ask: "What did you do the last time?"     No  Protocols used: Earwax-A-AH

## 2022-11-29 NOTE — Telephone Encounter (Signed)
  Chief Complaint: Right ear is draining and swollen Symptoms: Draining clear to yellow, outer canal is swollen and tender to touch.   Slightly painful when touched.   She's wondering if it needs to be flushed Frequency: Since Friday.    Pertinent Negatives: Patient denies N/A Disposition: [] ED /[x] Urgent Care (no appt availability in office) / [] Appointment(In office/virtual)/ []  [facility]/ [] Home Care/ [] Refused Recommended Disposition /[] [facility]/ []  Follow-up with PCP Additional Notes: No appts available with any of the providers at [facility].   Referred to the urgent care.   [facility] not running today.    Did not feel a virtual visit was appropriate for her issue.      She is agreeable to going to the urgent care

## 2022-11-29 NOTE — Telephone Encounter (Signed)
Noted  

## 2022-12-02 ENCOUNTER — Ambulatory Visit: Payer: MEDICAID | Admitting: Dermatology

## 2022-12-02 ENCOUNTER — Other Ambulatory Visit: Payer: Self-pay

## 2022-12-02 ENCOUNTER — Encounter: Payer: Self-pay | Admitting: Dermatology

## 2022-12-02 VITALS — BP 147/97 | HR 85

## 2022-12-02 DIAGNOSIS — L814 Other melanin hyperpigmentation: Secondary | ICD-10-CM

## 2022-12-02 DIAGNOSIS — D2372 Other benign neoplasm of skin of left lower limb, including hip: Secondary | ICD-10-CM | POA: Diagnosis not present

## 2022-12-02 DIAGNOSIS — D229 Melanocytic nevi, unspecified: Secondary | ICD-10-CM

## 2022-12-02 DIAGNOSIS — Z1283 Encounter for screening for malignant neoplasm of skin: Secondary | ICD-10-CM | POA: Diagnosis not present

## 2022-12-02 DIAGNOSIS — L729 Follicular cyst of the skin and subcutaneous tissue, unspecified: Secondary | ICD-10-CM

## 2022-12-02 DIAGNOSIS — D1801 Hemangioma of skin and subcutaneous tissue: Secondary | ICD-10-CM

## 2022-12-02 DIAGNOSIS — D2371 Other benign neoplasm of skin of right lower limb, including hip: Secondary | ICD-10-CM | POA: Diagnosis not present

## 2022-12-02 DIAGNOSIS — D485 Neoplasm of uncertain behavior of skin: Secondary | ICD-10-CM

## 2022-12-02 DIAGNOSIS — L578 Other skin changes due to chronic exposure to nonionizing radiation: Secondary | ICD-10-CM | POA: Diagnosis not present

## 2022-12-02 DIAGNOSIS — L821 Other seborrheic keratosis: Secondary | ICD-10-CM

## 2022-12-02 NOTE — Progress Notes (Signed)
New Patient Visit   Subjective  Kristen [name] is a 37 y.o. female who presents for the following: TBSE  Patient states she has moles scattered throughout her body that she would like to have examined. Patient reports the areas have been there for several years. She reports the areas are not bothersome.Patient rates irritation 0 out of 10. She states that the areas have not spread. Patient reports she has not previously been treated for these areas. Patient denies Hx of bx. Patient reports family history of skin cancer(s)(Mom (Melanoma) Granddad on mom's,Aunts).  The patient has spots, moles and lesions to be evaluated, some may be new or changing and the patient may have concern these could be cancer.  The following portions of the chart were reviewed this encounter and updated as appropriate: medications, allergies, medical history  Review of Systems:  No other skin or systemic complaints except as noted in HPI or Assessment and Plan.  Objective  Well appearing patient in no apparent distress; mood and affect are within normal limits.  A full examination was performed including scalp, head, eyes, ears, nose, lips, neck, chest, axillae, abdomen, bilateral upper extremities, bilateral lower extremities, hands, fingers, and fingernails. All findings within normal limits unless otherwise noted below.   Relevant exam findings are noted in the Assessment and Plan.  Left Thigh - Anterior 3 mm irregular brown Papule  Right Hip 8 mm irregular brown Papule            Assessment & Plan   LENTIGINES, SEBORRHEIC KERATOSES, HEMANGIOMAS - Benign normal skin lesions - Benign-appearing - Call for any changes  MELANOCYTIC NEVI - Tan-brown and/or pink-flesh-colored symmetric macules and papules - Benign appearing on exam today - Observation - Call clinic for new or changing moles - Recommend daily use of broad spectrum spf 30+ sunscreen to sun-exposed areas.   Moderate ACTINIC  DAMAGE - Chronic condition, secondary to cumulative UV/sun exposure - diffuse scaly erythematous macules with underlying dyspigmentation - Recommend daily broad spectrum sunscreen SPF 30+ to sun-exposed areas, reapply every 2 hours as needed.  - Staying in the shade or wearing long sleeves, sun glasses (UVA+UVB protection) and wide brim hats (4-inch brim around the entire circumference of the hat) are also recommended for sun protection.  - Call for new or changing lesions.   CYST Exam: 1 cm Subcutaneous nodule at left mid back  Treatment Plan: Benign-appearing. Exam most consistent with an epidermal inclusion cyst. Discussed that a cyst is a benign growth that can grow over time and sometimes get irritated or inflamed. Recommend observation if it is not bothersome. Discussed option of surgical excision to remove it if it is growing, symptomatic, or other changes noted. Please call for new or changing lesions so they can be evaluated.  SKIN CANCER SCREENING PERFORMED TODAY  Neoplasm of uncertain behavior of skin (2) Left Thigh - Anterior  Skin / nail biopsy Type of biopsy: tangential   Informed consent: discussed and consent obtained   Timeout: patient name, date of birth, surgical site, and procedure verified   Procedure prep:  Patient was prepped and draped in usual sterile fashion Prep type:  Isopropyl alcohol Anesthesia: the lesion was anesthetized in a standard fashion   Anesthetic:  1% lidocaine w/ epinephrine 1-100,000 buffered w/ 8.4% NaHCO3 Instrument used: DermaBlade   Hemostasis achieved with: aluminum chloride   Outcome: patient tolerated procedure well   Post-procedure details: sterile dressing applied and wound care instructions given   Dressing type: petrolatum  gauze and bandage    Specimen 1 - Surgical pathology Differential Diagnosis: DN  Check Margins: No  Right Hip  Skin / nail biopsy Type of biopsy: tangential   Informed consent: discussed and consent  obtained   Timeout: patient name, date of birth, surgical site, and procedure verified   Procedure prep:  Patient was prepped and draped in usual sterile fashion Prep type:  Isopropyl alcohol Anesthesia: the lesion was anesthetized in a standard fashion   Anesthetic:  1% lidocaine w/ epinephrine 1-100,000 buffered w/ 8.4% NaHCO3 Instrument used: DermaBlade   Hemostasis achieved with: aluminum chloride   Outcome: patient tolerated procedure well   Post-procedure details: sterile dressing applied and wound care instructions given   Dressing type: petrolatum gauze and bandage    Specimen 2 - Surgical pathology Differential Diagnosis: DN  Check Margins: No  Return in about 1 year (around 12/02/2023) for TBSE.   Documentation: I have reviewed the above documentation for accuracy and completeness, and I agree with the above.  Stasia Cavalier, am acting as scribe for Langston Reusing, DO.  Langston Reusing, DO

## 2022-12-02 NOTE — Patient Instructions (Addendum)

## 2022-12-04 LAB — SURGICAL PATHOLOGY

## 2022-12-05 NOTE — Progress Notes (Signed)
Hi Walsie,  Good news, Dr. Onalee Hua reviewed your biopsy results and they showed the spots removed were benign (not cancerous).  No additional treatment is required.  The detailed report is available to view in MyChart.  Have a great day!  Kind Regards,  Dr. Kermit Balo Care Team

## 2022-12-06 ENCOUNTER — Other Ambulatory Visit: Payer: Self-pay

## 2022-12-06 ENCOUNTER — Ambulatory Visit: Payer: MEDICAID | Admitting: Physician Assistant

## 2022-12-06 ENCOUNTER — Ambulatory Visit
Admission: EM | Admit: 2022-12-06 | Discharge: 2022-12-06 | Disposition: A | Discharge status: Home / Self Care | Payer: MEDICAID | Attending: Family Medicine | Admitting: Family Medicine

## 2022-12-06 DIAGNOSIS — H6991 Unspecified Eustachian tube disorder, right ear: Secondary | ICD-10-CM

## 2022-12-06 DIAGNOSIS — L089 Local infection of the skin and subcutaneous tissue, unspecified: Secondary | ICD-10-CM

## 2022-12-06 DIAGNOSIS — B9689 Other specified bacterial agents as the cause of diseases classified elsewhere: Secondary | ICD-10-CM | POA: Diagnosis not present

## 2022-12-06 MED ORDER — SULFAMETHOXAZOLE-TRIMETHOPRIM 800-160 MG PO TABS
1.0000 | ORAL_TABLET | Freq: Two times a day (BID) | ORAL | 0 refills | Status: AC
  Filled 2022-12-06: qty 20, 10d supply, fill #0

## 2022-12-06 MED ORDER — PREDNISONE 10 MG PO TABS
10.0000 mg | ORAL_TABLET | Freq: Every day | ORAL | 0 refills | Status: DC
  Filled 2022-12-06: qty 5, 5d supply, fill #0

## 2022-12-06 NOTE — Discharge Instructions (Addendum)
Continue to monitor skin infection.  If symptoms worsen or do not improve after completion of medication return for evaluation.  Leave wound open to air  as often as possible to promote wound healing.

## 2022-12-06 NOTE — ED Triage Notes (Signed)
Pt presents with c/o rt ear fullness X 1 wk. Pt states she had a mole removed recently and states concerns for a possible infection.

## 2022-12-06 NOTE — ED Provider Notes (Signed)
[facility]    CSN: [id number] Arrival date & time: 12/06/22  1356      History   Chief Complaint Chief Complaint  Patient presents with   Ear Pain    HPI Kristen [name] is a 37 y.o. female.   HPI Right Ear Pain x 1 week. No other associated symptom.  Patient endorses that the pain is deep within the right ear and the ear feels clogged and severely itchy at times.  She is uncertain if she has had any accumulation of wax in the ear as in the past she has required her right ear irrigation.  Skin Infection right hip Patient recently had a mole removed from her right hip 5 days ago and has noticed that the area has been increasingly red, tender with purulent scant drainage.  Concerned that the area is not healing appropriately as the area in which her pants are constantly rubbing up against her hip.  Not had any fever, nausea or vomiting.  No history of MRSA. Past Medical History:  Diagnosis Date   Anxiety 2013   treated at [facility] by Deatra Robinson    Asthma 1989    no hospitalization, or intubation, previously on advair and singulair. asthma worsening.    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 , dx in utero    Depression 2001   depressed since childhood    Dysrhythmia 2010   PVC's   Essential hypertension 03/12/2019   GERD (gastroesophageal reflux disease)    History of hiatal hernia    History of suicidal ideation    IBS (irritable bowel syndrome)    Kyphosis of thoracic region 2004    painful, runs in dad side    Migraine    Pancreatitis 2003   gallstone pancreatitis    Pancreatitis due to biliary obstruction 2003   PCOS (polycystic ovarian syndrome) 2014   facial hair, irregular periods, never been pregnant    Sleep apnea    no longer uses cpap    Patient Active Problem List   Diagnosis Date Noted   Diarrhea    Chronic diarrhea    Weight gain 11/01/2020   Hirsutism 11/01/2020   Gastroesophageal reflux disease without esophagitis 07/11/2020    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   POTS (postural orthostatic tachycardia syndrome) 06/09/2020   Morbid obesity (HCC) 12/14/2019   Endometrial polyp    Moderate episode of recurrent major depressive disorder (HCC) 11/18/2019   Encounter for autism screening 11/18/2019   DUB (dysfunctional uterine bleeding) 11/08/2019   Severe episode of recurrent major depressive disorder, 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   PTSD (post-traumatic stress disorder) 09/20/2019   Irritable bowel syndrome with diarrhea 04/23/2019   Abnormal serum thyroid stimulating hormone (TSH) level 04/23/2019   MDD (major depressive disorder), recurrent severe, 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   OSA on CPAP 03/17/2019   Anxiety and depression 03/12/2019   Chronic migraine with aura 03/12/2019   Essential hypertension 03/12/2019   Mild intermittent asthma without complication 03/12/2019   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   Elevated prolactin level 07/05/2016   Vulvar lesion 07/05/2016   Restless legs 05/24/2016   Seasonal allergies 05/13/2014   Intertrigo 05/13/2014   Decreased vision 05/13/2014   PCOS (polycystic ovarian syndrome) 02/07/2014   Social phobia 02/02/2007   Attention deficit disorder 02/02/2007   DYSLEXIA 02/02/2007   Asthma, severe persistent 02/02/2007   Idiopathic scoliosis and kyphoscoliosis 02/02/2007   OTHER SPECIFIED CONGENITAL ANOMALIES OF KIDNEY 02/02/2007   ELECTROCARDIOGRAM,  ABNORMAL 02/02/2007    Past Surgical History:  Procedure Laterality Date   BIOPSY  06/11/2021   Procedure: BIOPSY;  Surgeon: Napoleon Form, MD;  Location: [facility];  Service: Gastroenterology;;   BLADDER SURGERY     CHOLECYSTECTOMY  2003    COLONOSCOPY WITH PROPOFOL N/A 06/11/2021   Procedure: COLONOSCOPY WITH PROPOFOL;  Surgeon: Napoleon Form, MD;  Location: [facility];  Service: Gastroenterology;  Laterality: N/A;   DILATATION & CURETTAGE/HYSTEROSCOPY WITH MYOSURE N/A 12/14/2019    Procedure: DILATATION & CURETTAGE/HYSTEROSCOPY WITH Polypectomy with MYOSURE;  Surgeon: Tereso Newcomer, MD;  Location: [facility];  Service: Gynecology;  Laterality: N/A;   OPEN REDUCTION INTERNAL FIXATION (ORIF) PROXIMAL PHALANX Right 07/23/2013   Procedure: OPEN TREATMENT RIGHT RING FINGER, PROXIMAL INTERPHALANGEAL/JOINT FRACTURE DISLOCATION;  Surgeon: Jodi Marble, MD;  Location: [facility];  Service: Orthopedics;  Laterality: Right;   TONSILLECTOMY     and adenoidectomy    OB History     Gravida  0   Para      Term      Preterm      AB      Living         SAB      IAB      Ectopic      Multiple      Live Births               Home Medications    Prior to Admission medications   Medication Sig Start Date End Date Taking? Authorizing Provider  predniSONE (DELTASONE) 10 MG tablet Take 1 tablet (10 mg total) by mouth daily with breakfast. 12/06/22  Yes Bing Neighbors, NP  sulfamethoxazole-trimethoprim (BACTRIM DS) 800-160 MG tablet Take 1 tablet by mouth 2 (two) times daily for 10 days. 12/06/22 12/16/22 Yes Bing Neighbors, NP  albuterol (VENTOLIN HFA) 108 (90 Base) MCG/ACT inhaler Inhale 2 puffs into the lungs every 6 (six) hours as needed for wheezing or shortness of breath. 06/27/22   McClung, Marzella Schlein, PA-C  Benzocaine-Resorcinol (VAGISIL EX) Apply 1 application. topically as needed.    [provider]  busPIRone (BUSPAR) 10 MG tablet Take 1 tablet (10 mg total) by mouth 3 (three) times daily. 11/20/22   Shanna Cisco, NP  cholestyramine Lanetta Inch) 4 g packet Take 1 packet twice daily between meals 07/11/21   Esterwood, [name] S, PA-C  diclofenac Sodium (VOLTAREN) 1 % GEL Apply 2 g topically 4 (four) times daily. Patient taking differently: Apply 2 g topically as needed. 06/21/21   Marcine Matar, MD  fluticasone (FLONASE) 50 MCG/ACT nasal spray Place 2 sprays into both nostrils daily. 06/11/22   Waldon Merl, PA-C   gabapentin (NEURONTIN) 300 MG capsule TAKE 1 CAPSULE (300 MG TOTAL) BY MOUTH AT BEDTIME. Patient taking differently: Take 300 mg by mouth as needed (pain). 03/24/20 07/12/21  Marcine Matar, MD  hydrOXYzine (ATARAX) 25 MG tablet Take 1 tablet (25 mg total) by mouth 4 (four) times daily. 11/20/22   Toy Cookey E, NP  ipratropium-albuterol (DUONEB) 0.5-2.5 (3) MG/3ML SOLN Take 3 mLs by nebulization every 4 (four) hours as needed.    [provider]  loperamide (IMODIUM A-D) 2 MG tablet Take 2 mg by mouth 4 (four) times daily as needed for diarrhea or loose stools.    [provider]  medroxyPROGESTERone (PROVERA) 5 MG tablet Take 2 tablets (10 mg total) by mouth daily. For 15 days for amenorrhea 10/01/22  Adam Phenix, MD  metoprolol tartrate (LOPRESSOR) 25 MG tablet Take 1 tablet (25 mg total) by mouth 2 (two) times daily. 07/08/22   Swinyer, Zachary George, NP  mupirocin ointment (BACTROBAN) 2 % Apply 1 Application topically 2 (two) times daily. 06/27/22   Anders Simmonds, PA-C  naproxen (NAPROSYN) 375 MG tablet Take 1 tablet (375 mg total) by mouth 2 (two) times daily with a meal. 06/15/21   Palumbo, April, MD  omeprazole (PRILOSEC) 20 MG capsule Take 1 capsule (20 mg total) by mouth daily. 07/11/20   Marcine Matar, MD  ondansetron (ZOFRAN-ODT) 4 MG disintegrating tablet Take 1 tablet (4 mg total) by mouth every 8 (eight) hours as needed for nausea or vomiting. 09/13/22   Radford Pax, NP  promethazine-dextromethorphan (PROMETHAZINE-DM) 6.25-15 MG/5ML syrup Take 5 mLs by mouth 4 (four) times daily as needed for cough. 06/11/22   Waldon Merl, PA-C  propranolol (INDERAL) 10 MG tablet Take 2 tablets (20 mg total) by mouth 3 (three) times daily as needed (palpitations). 07/08/22   Swinyer, Zachary George, NP  QUEtiapine (SEROQUEL) 50 MG tablet Take 1 tablet (50 mg total) by mouth at bedtime. 11/20/22   Shanna Cisco, NP  sertraline (ZOLOFT) 100 MG tablet Take 2 tablets (200  mg total) by mouth at bedtime. 11/20/22   Shanna Cisco, NP  traZODone (DESYREL) 100 MG tablet Take 1 tablet (100 mg total) by mouth at bedtime as needed for sleep. 11/20/22   Shanna Cisco, NP    Family History Family History  Problem Relation Age of Onset   Hypertension Mother    Arnold-Chiari malformation Mother    Restless legs syndrome Mother    Osteoporosis Mother    COPD Mother    Asthma Mother    Squamous cell carcinoma Mother        skin    Eczema Mother    Arthritis Mother    Peripheral Artery Disease Mother    Anxiety disorder Mother    OCD Mother    Colonic polyp Mother    Hypertension Father    Scoliosis Father    Bipolar disorder Father    Congestive Heart Failure Father    COPD Father    Depression Father    Diabetes Maternal Grandmother    Hypertension Maternal Grandmother    Dementia Maternal Grandmother    OCD Maternal Grandmother    Alzheimer's disease Maternal Grandmother    Bone cancer Maternal Grandfather 17       bone marrow    Multiple myeloma Maternal Grandfather    Alcohol abuse Maternal Grandfather    Drug abuse Maternal Grandfather    Cancer Paternal Grandmother        colon   Diabetes Paternal Grandfather    Alcohol abuse Paternal Grandfather    Drug abuse Paternal Grandfather    Dementia Paternal Grandfather    Alcohol abuse Maternal Aunt    Depression Maternal Aunt    Pancreatic cancer Maternal Aunt    Alcohol abuse Paternal Aunt    Depression Paternal Aunt    ADD / ADHD Cousin     Social History Social History   Tobacco Use   Smoking status: Never    Passive exposure: Current   Smokeless tobacco: Never  Vaping Use   Vaping status: Never Used  Substance Use Topics   Alcohol use: No   Drug use: No     Allergies   Mucinex [guaifenesin er], Penicillins, Contrast media [iodinated contrast  media], Latex, Multihance [gadobenate], and Other   Review of Systems Review of Systems   Physical Exam Triage Vital  Signs ED Triage Vitals  Encounter Vitals Group     BP 12/06/22 1421 (!) 158/113     Systolic BP Percentile --      Diastolic BP Percentile --      Pulse Rate 12/06/22 1421 74     Resp 12/06/22 1421 17     Temp 12/06/22 1421 97.6 F (36.4 C)     Temp Source 12/06/22 1421 Oral     SpO2 12/06/22 1421 98 %     Weight --      Height --      Head Circumference --      Peak Flow --      Pain Score 12/06/22 1420 3     Pain Loc --      Pain Education --      Exclude from Growth Chart --    No data found.  Updated Vital Signs BP (!) 135/95 (BP Location: Right Arm)   Pulse 75   Temp 97.6 F (36.4 C) (Oral)   Resp 17   LMP  (LMP Unknown)   SpO2 98%   Visual Acuity Right Eye Distance:   Left Eye Distance:   Bilateral Distance:    Right Eye Near:   Left Eye Near:    Bilateral Near:     Physical Exam Constitutional:      Appearance: Normal appearance.  HENT:     Head: Normocephalic and atraumatic.     Right Ear: A middle ear effusion is present.     Left Ear:  No middle ear effusion.  Eyes:     Extraocular Movements: Extraocular movements intact.     Conjunctiva/sclera: Conjunctivae normal.     Pupils: Pupils are equal, round, and reactive to light.  Cardiovascular:     Rate and Rhythm: Normal rate and regular rhythm.  Pulmonary:     Effort: Pulmonary effort is normal.     Breath sounds: Normal breath sounds.  Musculoskeletal:     Cervical back: Normal range of motion and neck supple.       Legs:  Lymphadenopathy:     Cervical: No cervical adenopathy.  Skin:    General: Skin is warm and dry.  Neurological:     General: No focal deficit present.     Mental Status: She is alert.     UC Treatments / Results  Labs (all labs ordered are listed, but only abnormal results are displayed) Labs Reviewed - No data to display  EKG   Radiology No results found.  Procedures Procedures (including critical care time)  Medications Ordered in UC Medications - No  data to display  Initial Impression / Assessment and Plan / UC Course  I have reviewed the triage vital signs and the nursing notes.  Pertinent labs & imaging results that were available during my care of the patient were reviewed by me and considered in my medical decision making (see chart for details).    Eustachian Tube Right Ear, agreed to trial a short course of prednisone 10 mg daily for 5 days to help with inner ear pressure secondary to middle ear effusion and eustachian tube dysfunction. Bacterial skin infection child Bactrim twice daily for 10 days.  Discussed wound care instructions.  Return if symptoms Final Clinical Impressions(s) / UC Diagnoses   Final diagnoses:  Eustachian tube disorder, right  Bacterial skin infection  Discharge Instructions      Continue to monitor skin infection.  If symptoms worsen or do not improve after completion of medication return for evaluation.  Leave wound open to air  as often as possible to promote wound healing.       ED Prescriptions     Medication Sig Dispense Auth. Provider   predniSONE (DELTASONE) 10 MG tablet Take 1 tablet (10 mg total) by mouth daily with breakfast. 5 tablet Bing Neighbors, NP   sulfamethoxazole-trimethoprim (BACTRIM DS) 800-160 MG tablet Take 1 tablet by mouth 2 (two) times daily for 10 days. 20 tablet Bing Neighbors, NP      PDMP not reviewed this encounter.   Bing Neighbors, NP 12/06/22 [phone]

## 2022-12-10 ENCOUNTER — Ambulatory Visit: Payer: Medicaid Other | Admitting: Dermatology

## 2022-12-19 ENCOUNTER — Ambulatory Visit: Payer: MEDICAID | Admitting: Physical Therapy

## 2022-12-20 ENCOUNTER — Other Ambulatory Visit: Payer: Self-pay

## 2022-12-20 ENCOUNTER — Other Ambulatory Visit: Payer: MEDICAID

## 2022-12-20 ENCOUNTER — Ambulatory Visit (INDEPENDENT_AMBULATORY_CARE_PROVIDER_SITE_OTHER): Payer: MEDICAID | Admitting: Physician Assistant

## 2022-12-20 ENCOUNTER — Encounter: Payer: Self-pay | Admitting: Physician Assistant

## 2022-12-20 VITALS — BP 138/82 | HR 84 | Ht 66.0 in | Wt 310.0 lb

## 2022-12-20 DIAGNOSIS — K219 Gastro-esophageal reflux disease without esophagitis: Secondary | ICD-10-CM | POA: Diagnosis not present

## 2022-12-20 DIAGNOSIS — K921 Melena: Secondary | ICD-10-CM

## 2022-12-20 DIAGNOSIS — K529 Noninfective gastroenteritis and colitis, unspecified: Secondary | ICD-10-CM

## 2022-12-20 DIAGNOSIS — K580 Irritable bowel syndrome with diarrhea: Secondary | ICD-10-CM

## 2022-12-20 DIAGNOSIS — G90A Postural orthostatic tachycardia syndrome (POTS): Secondary | ICD-10-CM

## 2022-12-20 MED ORDER — COLESTIPOL HCL 1 G PO TABS
2.0000 g | ORAL_TABLET | Freq: Two times a day (BID) | ORAL | 0 refills | Status: DC
  Filled 2022-12-20: qty 120, 30d supply, fill #0

## 2022-12-20 MED ORDER — OMEPRAZOLE 40 MG PO CPDR
40.0000 mg | DELAYED_RELEASE_CAPSULE | Freq: Every day | ORAL | 0 refills | Status: DC
  Filled 2022-12-20: qty 90, 90d supply, fill #0

## 2022-12-20 NOTE — Progress Notes (Signed)
12/20/2022 Kristen [name] [id number] 09-Jul-1985  Referring provider: Marcine Matar, MD Primary GI doctor: Dr. Lavon Paganini  ASSESSMENT AND PLAN:   Gastroesophageal Reflux Disease (GERD) Worsening symptoms with upper abdominal discomfort and burning sensation. Unresponsive to omeprazole 20mg  daily. Recent use of antibiotics and prednisone. Family history of ulcers. Melena noted, possibly due to Pepto-Bismol use but patient states started prior.  -Increase omeprazole to 40mg  daily. -Plan for upper endoscopy to further evaluate at the hospital secondary to morbid obesity. -Advise to stop prednisone if primary care physician agrees. I discussed risks of EGD with patient today, including risk of sedation, bleeding or perforation.  Patient provides understanding and gave verbal consent to proceed.  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 Worsening symptoms after recent antibiotic use (Bactrim). Previous tests including colonoscopy, celiac, fecal calprotectin, and pancreatic elastase were negative. Patient has been prescribed Questran but has poor compliance due to texture intolerance. -Switch Questran to colestipol pills for better tolerance. -Consider nortriptyline for possible IBS-D. -Test for C. diff to rule out infection due to recent antibiotic use.    Morbid obesity  Body mass index is 50.04 kg/m.  -Patient has been advised to make an attempt to improve diet and exercise patterns to aid in weight loss. -Recommended diet heavy in fruits and veggies and low in animal meats, cheeses, and dairy products, appropriate calorie intake    Patient Care Team: Marcine Matar, MD as PCP - General (Internal Medicine) Meriam Sprague, MD (Inactive) as PCP - Cardiology (Cardiology)  HISTORY OF PRESENT ILLNESS: 37 y.o. female with a past medical history of GERD, POTS,  Ehlers Danlos syndrome, obesity, depression with PTSD, OSA on CPAP, status post cholecystectomy and others listed below presents for evaluation of chronic diarrhea.   06/11/2021 colonoscopy to rule out IBD/microscopic colitis for chronic diarrhea showed normal, random biopsies did show some mild edema and a few lamina propria neutrophils but negative for microscopic colitis and no chronicity of features Patient was last seen 07/11/2021 with [name] with diarrhea 6-12 bowel movements a day on Questran for bile acid diarrhea, Imodium hours postprandial with cramping.  Previously is negative celiac and fecal calprotectin. Patient had pancreases elastase was negative  She states upper AB will burn with any food. She has taken pepto to help but it does not. Feels raw burning feeling. Rare NSAIDS, no smoking, no ETOH.  Mom and dad with ulcers.  She is on omperaozle 20 mg daily.  She continues to have 6-12 Bm's a day, can have nocturnal stools. AM discomfort with it.  She had xifaxin trial in 10/09/2022 with some help.  She states during her menses her periods are worse.  She has a lot of bloating with the stools, increase gas.  She had mole removed, had infection and has been on ABX and prednisone.  Only takes [place] once a day or every other day, she does not like the texture.    She  reports that she has never smoked. She has been exposed to tobacco smoke. She has never used smokeless tobacco. She reports that she does not drink alcohol and does not use drugs.  RELEVANT LABS AND IMAGING:   CBC    Component Value Date/Time   WBC 9.5 06/27/2022 1434   WBC 9.3 01/07/2021 0723   RBC 4.72 06/27/2022 1434   RBC 4.89 01/07/2021 0723   HGB 13.3 06/27/2022 1434  HCT 39.9 06/27/2022 1434   PLT 278 06/27/2022 1434   MCV 85 06/27/2022 1434   MCH 28.2 06/27/2022 1434   MCH 27.6 01/07/2021 0723   MCHC 33.3 06/27/2022 1434   MCHC 31.5 01/07/2021 0723   RDW 13.5 06/27/2022 1434   LYMPHSABS 3.0  06/27/2022 1434   MONOABS 0.4 01/07/2021 0723   EOSABS 0.2 06/27/2022 1434   BASOSABS 0.1 06/27/2022 1434   Recent Labs    06/27/22 1434  HGB 13.3    CMP     Component Value Date/Time   NA 139 06/19/2022 0814   NA 141 07/14/2020 0937   K 3.5 06/19/2022 0814   CL 100 06/19/2022 0814   CO2 30 06/19/2022 0814   GLUCOSE 106 (H) 06/19/2022 0814   BUN 12 06/19/2022 0814   BUN 12 07/14/2020 0937   CREATININE 1.04 06/19/2022 0814   CREATININE 1.09 03/25/2014 1730   CALCIUM 9.5 06/19/2022 0814   PROT 6.5 06/18/2022 1025   PROT 6.4 07/22/2017 1339   ALBUMIN 3.7 06/18/2022 1025   ALBUMIN 4.2 07/22/2017 1339   AST 14 06/18/2022 1025   ALT 13 06/18/2022 1025   ALKPHOS 110 06/18/2022 1025   BILITOT 0.6 06/18/2022 1025   BILITOT 0.4 07/22/2017 1339   GFRNONAA >60 01/07/2021 0723   GFRNONAA 70 03/25/2014 1730   GFRAA >60 04/15/2019 0624   GFRAA 80 03/25/2014 1730      Latest Ref Rng & Units 06/18/2022   10:25 AM 01/07/2021    7:23 AM 04/15/2019    6:24 AM  Hepatic Function  Total Protein 6.0 - 8.3 g/dL 6.5  6.6  6.7    7.0   Albumin 3.5 - 5.2 g/dL 3.7  3.6  3.7    3.8   AST 0 - 37 U/L 14  21  20    20    ALT 0 - 35 U/L 13  20  22    22    Alk Phosphatase 39 - 117 U/L 110  111  112    111   Total Bilirubin 0.2 - 1.2 mg/dL 0.6  0.3  0.7    0.5   Bilirubin, Direct 0.0 - 0.2 mg/dL   0.1       Current Medications:   Current Outpatient Medications (Endocrine & Metabolic):    medroxyPROGESTERone (PROVERA) 5 MG tablet, Take 2 tablets (10 mg total) by mouth daily. For 15 days for amenorrhea   predniSONE (DELTASONE) 10 MG tablet, Take 1 tablet (10 mg total) by mouth daily with breakfast.  Current Outpatient Medications (Cardiovascular):    colestipol (COLESTID) 1 g tablet, Take 2 tablets (2 g total) by mouth 2 (two) times daily.   metoprolol tartrate (LOPRESSOR) 25 MG tablet, Take 1 tablet (25 mg total) by mouth 2 (two) times daily.   propranolol (INDERAL) 10 MG tablet, Take 2  tablets (20 mg total) by mouth 3 (three) times daily as needed (palpitations).  Current Outpatient Medications (Respiratory):    albuterol (VENTOLIN HFA) 108 (90 Base) MCG/ACT inhaler, Inhale 2 puffs into the lungs every 6 (six) hours as needed for wheezing or shortness of breath.   fluticasone (FLONASE) 50 MCG/ACT nasal spray, Place 2 sprays into both nostrils daily.   ipratropium-albuterol (DUONEB) 0.5-2.5 (3) MG/3ML SOLN, Take 3 mLs by nebulization every 4 (four) hours as needed.   promethazine-dextromethorphan (PROMETHAZINE-DM) 6.25-15 MG/5ML syrup, Take 5 mLs by mouth 4 (four) times daily as needed for cough.  Current Outpatient Medications (Analgesics):    naproxen (NAPROSYN)  375 MG tablet, Take 1 tablet (375 mg total) by mouth 2 (two) times daily with a meal.   Current Outpatient Medications (Other):    Benzocaine-Resorcinol (VAGISIL EX), Apply 1 application. topically as needed.   busPIRone (BUSPAR) 10 MG tablet, Take 1 tablet (10 mg total) by mouth 3 (three) times daily.   diclofenac Sodium (VOLTAREN) 1 % GEL, Apply 2 g topically 4 (four) times daily. (Patient taking differently: Apply 2 g topically as needed.)   hydrOXYzine (ATARAX) 25 MG tablet, Take 1 tablet (25 mg total) by mouth 4 (four) times daily.   loperamide (IMODIUM A-D) 2 MG tablet, Take 2 mg by mouth 4 (four) times daily as needed for diarrhea or loose stools.   mupirocin ointment (BACTROBAN) 2 %, Apply 1 Application topically 2 (two) times daily.   omeprazole (PRILOSEC) 40 MG capsule, Take 1 capsule (40 mg total) by mouth daily before breakfast.   ondansetron (ZOFRAN-ODT) 4 MG disintegrating tablet, Take 1 tablet (4 mg total) by mouth every 8 (eight) hours as needed for nausea or vomiting.   QUEtiapine (SEROQUEL) 50 MG tablet, Take 1 tablet (50 mg total) by mouth at bedtime.   sertraline (ZOLOFT) 100 MG tablet, Take 2 tablets (200 mg total) by mouth at bedtime.   traZODone (DESYREL) 100 MG tablet, Take 1 tablet (100 mg  total) by mouth at bedtime as needed for sleep.   gabapentin (NEURONTIN) 300 MG capsule, TAKE 1 CAPSULE (300 MG TOTAL) BY MOUTH AT BEDTIME. (Patient taking differently: Take 300 mg by mouth as needed (pain).)  Medical History:  Past Medical History:  Diagnosis Date   Anxiety 2013   treated at [facility] by Deatra Robinson    Asthma 1989    no hospitalization, or intubation, previously on advair and singulair. asthma worsening.    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 , dx in utero    Depression 2001   depressed since childhood    Dysrhythmia 2010   PVC's   Essential hypertension 03/12/2019   GERD (gastroesophageal reflux disease)    History of hiatal hernia    History of suicidal ideation    IBS (irritable bowel syndrome)    Kyphosis of thoracic region 2004    painful, runs in dad side    Migraine    Pancreatitis 2003   gallstone pancreatitis    Pancreatitis due to biliary obstruction 2003   PCOS (polycystic ovarian syndrome) 2014   facial hair, irregular periods, never been pregnant    Sleep apnea    no longer uses cpap   Allergies:  Allergies  Allergen Reactions   Mucinex [Guaifenesin Er] Hives, Itching and Swelling   Penicillins Swelling    Did it involve swelling of the face/tongue/throat, SOB, or low BP? Yes Did it involve sudden or severe rash/hives, skin peeling, or any reaction on the inside of your mouth or nose? No Did you need to seek medical attention at a hospital or doctor's office? Yes When did it last happen?      >10 years ago If all above answers are "NO", may proceed with cephalosporin use.    Contrast Media [Iodinated Contrast Media] Nausea And Vomiting   Latex Hives and Swelling   Multihance [Gadobenate] Nausea And Vomiting   Other     Pickles - throat swelling and nausea      Surgical History:  She  has a past surgical history that includes Open reduction internal fixation (orif) proximal phalanx (Right, 07/23/2013); Cholecystectomy (2003); Bladder  surgery; Tonsillectomy; Dilatation & curettage/hysteroscopy with myosure (N/A, 12/14/2019); Colonoscopy with propofol (N/A, 06/11/2021); biopsy (06/11/2021); and Mohs surgery (Left, 11/2022). Family History:  Her family history includes ADD / ADHD in her cousin; Alcohol abuse in her maternal aunt, maternal grandfather, paternal aunt, and paternal grandfather; Alzheimer's disease in her maternal grandmother; Anxiety disorder in her mother; Arnold-Chiari malformation in her mother; Arthritis in her mother; Asthma in her mother; Bipolar disorder in her father; Bone cancer (age of onset: 77) in her maternal grandfather; COPD in her father and mother; Cancer in her paternal grandmother; Colonic polyp in her mother; Congestive Heart Failure in her father; Dementia in her maternal grandmother and paternal grandfather; Depression in her father, maternal aunt, and paternal aunt; Diabetes in her maternal grandmother and paternal grandfather; Drug abuse in her maternal grandfather and paternal grandfather; Eczema in her mother; Hypertension in her father, maternal grandmother, and mother; Multiple myeloma in her maternal grandfather; OCD in her maternal grandmother and mother; Osteoporosis in her mother; Pancreatic cancer in her maternal aunt; Peripheral Artery Disease in her mother; Restless legs syndrome in her mother; Scoliosis in her father; Squamous cell carcinoma in her mother; Stroke in her mother.  REVIEW OF SYSTEMS  : All other systems reviewed and negative except where noted in the History of Present Illness.  PHYSICAL EXAM: BP 138/82   Pulse 84   Ht 5\' 6"  (1.676 m)   Wt (!) 310 lb (140.6 kg)   LMP 12/17/2022   BMI 50.04 kg/m  General Appearance: Well nourished, in no apparent distress. Head:   Normocephalic and atraumatic. Eyes:  sclerae anicteric,conjunctive pink  Respiratory: Respiratory effort normal, BS equal bilaterally without rales, rhonchi, wheezing. Cardio: RRR with no MRGs. Peripheral  pulses intact.  Abdomen: Soft,  Obese ,active bowel sounds. mild tenderness in the epigastrium. Without guarding and Without rebound. No masses. Rectal: Not evaluated Musculoskeletal: Full ROM, Normal gait. Without edema. Skin:  Dry and intact without significant lesions or rashes Neuro: Alert and  oriented x4;  No focal deficits. Psych:  Cooperative. Normal mood and affect.    Doree Albee, PA-C 10:43 AM

## 2022-12-20 NOTE — Patient Instructions (Addendum)
Your provider has requested that you go to the basement level for lab work before leaving today. Press "B" on the elevator. The lab is located at the first door on the left as you exit the elevator.   Please take your proton pump inhibitor medication, omeprazole 40 daily  Please take this medication 30 minutes to 1 hour before meals- this makes it more effective.  Avoid spicy and acidic foods Avoid fatty foods Limit your intake of coffee, tea, alcohol, and carbonated drinks Work to maintain a healthy weight Keep the head of the bed elevated at least 3 inches with blocks or a wedge pillow if you are having any nighttime symptoms Stay upright for 2 hours after eating Avoid meals and snacks three to four hours before bedtime  First do a trial off milk/lactose products if you use them.  Add fiber like benefiber or citracel once a day Increase activity Please try to decrease stress. consider talking with PCP about anti anxiety medication or try head space app for meditation. if any worsening symptoms like blood in stool, weight loss, please call the office     FODMAP stands for fermentable oligo-, di-, mono-saccharides and polyols (1). These are the scientific terms used to classify groups of carbs that are difficult for our body to digest and that are notorious for triggering digestive symptoms like bloating, gas, loose stools and stomach pain.   You can try low FODMAP diet  - start with eliminating just one column at a time that you feel may be a trigger for you. - the table at the very bottom contains foods that are low in FODMAPs   Sometimes trying to eliminate the FODMAP's from your diet is difficult or tricky, if you are stuggling with trying to do the elimination diet you can try an enzyme.  There is a food enzymes that you sprinkle in or on your food that helps break down the FODMAP. You can read more about the enzyme by going to this  site: https://fodzyme.com/    Gastroparesis Please do small frequent meals like 4-6 meals a day.  Eat and drink liquids at separate times.  Avoid high fiber foods, cook your vegetables, avoid high fat food.  Suggest spreading protein throughout the day (greek yogurt, glucerna, soft meat, milk, eggs) Choose soft foods that you can mash with a fork When you are more symptomatic, change to pureed foods foods and liquids.  Consider reading "Living well with Gastroparesis" by Reuel Derby Gastroparesis is a condition in which food takes longer than normal to empty from the stomach. This condition is also known as delayed gastric emptying. It is usually a long-term (chronic) condition. There is no cure, but there are treatments and things that you can do at home to help relieve symptoms. Treating the underlying condition that causes gastroparesis can also help relieve symptoms What are the causes? In many cases, the cause of this condition is not known. Possible causes include: A hormone (endocrine) disorder, such as hypothyroidism or diabetes. A nervous system disease, such as Parkinson's disease or multiple sclerosis. Cancer, infection, or surgery that affects the stomach or vagus nerve. The vagus nerve runs from your chest, through your neck, and to the lower part of your brain. A connective tissue disorder, such as scleroderma. Certain medicines. What increases the risk? You are more likely to develop this condition if: You have certain disorders or diseases. These may include: An endocrine disorder. An eating disorder. Amyloidosis. Scleroderma. Parkinson's disease. Multiple  sclerosis. Cancer or infection of the stomach or the vagus nerve. You have had surgery on your stomach or vagus nerve. You take certain medicines. You are female. What are the signs or symptoms? Symptoms of this condition include: Feeling full after eating very little or a loss of appetite. Nausea,  vomiting, or heartburn. Bloating of your abdomen. Inconsistent blood sugar (glucose) levels on blood tests. Unexplained weight loss. Acid from the stomach coming up into the esophagus (gastroesophageal reflux). Sudden tightening (spasm) of the stomach, which can be painful. Symptoms may come and go. Some people may not notice any symptoms. How is this diagnosed? This condition is diagnosed with tests, such as: Tests that check how long it takes food to move through the stomach and intestines. These tests include: Upper gastrointestinal (GI) series. For this test, you drink a liquid that shows up well on X-rays, and then X-rays are taken of your intestines. Gastric emptying scintigraphy. For this test, you eat food that contains a small amount of radioactive material, and then scans are taken. Wireless capsule GI monitoring system. For this test, you swallow a pill (capsule) that records information about how foods and fluid move through your stomach. Gastric manometry. For this test, a tube is passed down your throat and into your stomach to measure electrical and muscular activity. Endoscopy. For this test, a long, thin tube with a camera and light on the end is passed down your throat and into your stomach to check for problems in your stomach lining. Ultrasound. This test uses sound waves to create images of the inside of your body. This can help rule out gallbladder disease or pancreatitis as a cause of your symptoms. How is this treated? There is no cure for this condition, but treatment and home care may relieve symptoms. Treatment may include: Treating the underlying cause. Managing your symptoms by making changes to your diet and exercise habits. Taking medicines to control nausea and vomiting and to stimulate stomach muscles. Getting food through a feeding tube in the hospital. This may be done in severe cases. Having surgery to insert a device called a gastric electrical stimulator  into your body. This device helps improve stomach emptying and control nausea and vomiting. Follow these instructions at home: Take over-the-counter and prescription medicines only as told by your health care provider. Follow instructions from your health care provider about eating or drinking restrictions. Your health care provider may recommend that you: Eat smaller meals more often. Eat low-fat foods. Eat low-fiber forms of high-fiber foods. For example, eat cooked vegetables instead of raw vegetables. Have only liquid foods instead of solid foods. Liquid foods are easier to digest. Drink enough fluid to keep your urine pale yellow. Exercise as often as told by your health care provider. Keep all follow-up visits. This is important. Contact a health care provider if you: Notice that your symptoms do not improve with treatment. Have new symptoms. Get help right away if you: Have severe pain in your abdomen that does not improve with treatment. Have nausea that is severe or does not go away. Vomit every time you drink fluids. Summary Gastroparesis is a long-term (chronic) condition in which food takes longer than normal to empty from the stomach. Symptoms include nausea, vomiting, heartburn, bloating of your abdomen, and loss of appetite. Eating smaller portions, low-fat foods, and low-fiber forms of high-fiber foods may help you manage your symptoms. Get help right away if you have severe pain in your abdomen. This information is  not intended to replace advice given to you by your health care provider. Make sure you discuss any questions you have with your health care provider. Document Revised: 05/31/2019 Document Reviewed: 05/31/2019 Elsevier Patient Education  2021 [employer].

## 2022-12-23 ENCOUNTER — Ambulatory Visit: Payer: MEDICAID | Attending: Urology | Admitting: Physical Therapy

## 2022-12-23 DIAGNOSIS — R279 Unspecified lack of coordination: Secondary | ICD-10-CM | POA: Diagnosis present

## 2022-12-23 DIAGNOSIS — R293 Abnormal posture: Secondary | ICD-10-CM | POA: Diagnosis present

## 2022-12-23 DIAGNOSIS — M6281 Muscle weakness (generalized): Secondary | ICD-10-CM | POA: Insufficient documentation

## 2022-12-23 NOTE — Therapy (Signed)
OUTPATIENT PHYSICAL THERAPY FEMALE PELVIC Treatment   Patient Name: Brittay Celis MRN: [id number] DOB:December 05, 1985, 37 y.o., female Today's Date: 12/23/2022  END OF SESSION:  PT End of Session - 12/23/22 1606     Visit Number 2    Date for PT Re-Evaluation 02/15/23    Authorization Type [employer]    PT Start Time 1605    PT Stop Time 1643    PT Time Calculation (min) 38 min    Activity Tolerance Patient tolerated treatment well;No increased pain    Behavior During Therapy [facility] for tasks assessed/performed             Past Medical History:  Diagnosis Date   Anxiety 2013   treated at [facility] by Deatra Robinson    Asthma 1989    no hospitalization, or intubation, previously on advair and singulair. asthma worsening.    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 , dx in utero    Depression 2001   depressed since childhood    Dysrhythmia 2010   PVC's   Essential hypertension 03/12/2019   GERD (gastroesophageal reflux disease)    History of hiatal hernia    History of suicidal ideation    IBS (irritable bowel syndrome)    Kyphosis of thoracic region 2004    painful, runs in dad side    Migraine    Pancreatitis 2003   gallstone pancreatitis    Pancreatitis due to biliary obstruction 2003   PCOS (polycystic ovarian syndrome) 2014   facial hair, irregular periods, never been pregnant    Sleep apnea    no longer uses cpap   Past Surgical History:  Procedure Laterality Date   BIOPSY  06/11/2021   Procedure: BIOPSY;  Surgeon: Napoleon Form, MD;  Location: [facility];  Service: Gastroenterology;;   BLADDER SURGERY     CHOLECYSTECTOMY  2003   COLONOSCOPY WITH PROPOFOL N/A 06/11/2021   Procedure: COLONOSCOPY WITH PROPOFOL;  Surgeon: Napoleon Form, MD;  Location: [facility];  Service: Gastroenterology;  Laterality: N/A;   DILATATION & CURETTAGE/HYSTEROSCOPY WITH MYOSURE N/A 12/14/2019   Procedure: DILATATION & CURETTAGE/HYSTEROSCOPY WITH Polypectomy with  MYOSURE;  Surgeon: Tereso Newcomer, MD;  Location: [facility];  Service: Gynecology;  Laterality: N/A;   MOHS SURGERY Left 11/2022   Right side and left leg   OPEN REDUCTION INTERNAL FIXATION (ORIF) PROXIMAL PHALANX Right 07/23/2013   Procedure: OPEN TREATMENT RIGHT RING FINGER, PROXIMAL INTERPHALANGEAL/JOINT FRACTURE DISLOCATION;  Surgeon: Jodi Marble, MD;  Location: [facility];  Service: Orthopedics;  Laterality: Right;   TONSILLECTOMY     and adenoidectomy   Patient Active Problem List   Diagnosis Date Noted   Diarrhea    Chronic diarrhea    Weight gain 11/01/2020   Hirsutism 11/01/2020   Gastroesophageal reflux disease without esophagitis 07/11/2020   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   POTS (postural orthostatic tachycardia syndrome) 06/09/2020   Morbid obesity (HCC) 12/14/2019   Endometrial polyp    Moderate episode of recurrent major depressive disorder (HCC) 11/18/2019   Encounter for autism screening 11/18/2019   DUB (dysfunctional uterine bleeding) 11/08/2019   Severe episode of recurrent major depressive disorder, 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   PTSD (post-traumatic stress disorder) 09/20/2019   Irritable bowel syndrome with diarrhea 04/23/2019   Abnormal serum thyroid stimulating hormone (TSH) level 04/23/2019   MDD (major depressive disorder), recurrent severe, 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   OSA on CPAP 03/17/2019   Anxiety and depression 03/12/2019  Chronic migraine with aura 03/12/2019   Essential hypertension 03/12/2019   Mild intermittent asthma without complication 03/12/2019   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   Elevated prolactin level 07/05/2016   Vulvar lesion 07/05/2016   Restless legs 05/24/2016   Seasonal allergies 05/13/2014   Intertrigo 05/13/2014   Decreased vision 05/13/2014   PCOS (polycystic ovarian syndrome) 02/07/2014   Social phobia 02/02/2007   Attention deficit disorder 02/02/2007   DYSLEXIA  02/02/2007   Asthma, severe persistent 02/02/2007   Idiopathic scoliosis and kyphoscoliosis 02/02/2007   OTHER SPECIFIED CONGENITAL ANOMALIES OF KIDNEY 02/02/2007   ELECTROCARDIOGRAM, ABNORMAL 02/02/2007    PCP: Jonah Blue, MD  REFERRING PROVIDER: Alfredo Martinez, MD  REFERRING DIAG: N39.46 (ICD-10-CM) - Mixed incontinence  THERAPY DIAG:  Muscle weakness (generalized)  Abnormal posture  Unspecified lack of coordination  Rationale for Evaluation and Treatment: Rehabilitation  ONSET DATE: 1-2 years ago  SUBJECTIVE:                                                                                                                                                                                           SUBJECTIVE STATEMENT: Leakage continues with coughing/sneezing but better than prior to evaluation. Has been trying to do knack with stressors and this has helped, with sickness does report fully losing urine with coughing/sneezing with several in a row. Hadn't had period for 3 months but has returned this week and isn't feeling well.   Fluid intake: Yes: water - 4 large glasses a day, Gatorade 1-2x daily, tea 1-2 glasses daily     PAIN:  Are you having pain? Yes NPRS scale: 4/10  Pain location:  with period cramping  Pain type: aching and dull Pain description: constant   Aggravating factors: periods, increased mobility, extreme heat Relieving factors: rest, OTC meds, warm shower  PRECAUTIONS: Other: POTS, Falls with ankles subluxing   RED FLAGS: None   WEIGHT BEARING RESTRICTIONS: No  FALLS:  Has patient fallen in last 6 months? Yes. Number of falls 10  LIVING ENVIRONMENT: Lives with: lives with their family Lives in: House/apartment   OCCUPATION: not currently  PLOF: Independent  PATIENT GOALS: to not pee myself  PERTINENT HISTORY:  Anxiety, Depression, hiatal hernia, IBS , PCOS,  CHOLECYSTECTOMY, DILATATION & CURETTAGE/HYSTEROSCOPY, Hirsutism,  Ehlers-Danlos disease, obesity,  POTS,  Endometrial polyp, DUB, Vulvar lesion,   Sexual abuse: No  BOWEL MOVEMENT: Pain with bowel movement: Yes - cramping to empty, hurts before, during and after and fatigued after with stomach pain. Takes 20 mins - 1 hour, wipes several times to get clean. Type of bowel movement:Type (Bristol Stool Scale) type 7-6, Frequency  4-10+x daily, and Strain No Fully empty rectum: Yes:   Leakage: Yes: with urgency could be small amount to full loss Pads: Yes: only with urine leakage, very rare to leak stool Fiber supplement: No   URINATION: Pain with urination: No Fully empty bladder: Yes:   Stream: Strong Urgency: Yes:   Frequency: sometimes quicker than every 2 hours but not always, 1-2x nightly Leakage: Urge to void, Coughing, Sneezing, Laughing, Exercise, Lifting, and Bending forward Pads: Yes: 0-5 pads   INTERCOURSE: Pain with intercourse: Initial Penetration, During Penetration, and After Intercourse Ability to have vaginal penetration:  Yes: reports dryness with intercourse Climax: not painful Marinoff Scale: 1/3  PREGNANCY: Vaginal deliveries 0 Tearing na C-section deliveries 0 Currently pregnant No  PROLAPSE: None   OBJECTIVE:   DIAGNOSTIC FINDINGS:  PATIENT SURVEYS:    PFIQ-7 3  COGNITION: Overall cognitive status: Within functional limits for tasks assessed     SENSATION: Light touch: Appears intact Proprioception: Appears intact  MUSCLE LENGTH: hypermobile  LUMBAR SPECIAL TESTS:  Straight leg raise test: lumbar arching noted and Single leg stance test: bil hip drop noted <5s  FUNCTIONAL TESTS:  Functional squat with UE support at knees to return to standing, bil valgus knees with return to standing, ant 50% descent   GAIT: WFL  POSTURE: rounded shoulders, forward head, increased thoracic kyphosis, anterior pelvic tilt, and also sits with bil arms behind back to help support large breast  tissue    LUMBARAROM/PROM:  A/PROM A/PROM  eval  Flexion WFL  Extension WFL  Right lateral flexion WFL  Left lateral flexion WFL  Right rotation WFL  Left rotation WFL   (Blank rows = not tested)  LOWER EXTREMITY ROM:  WFL  LOWER EXTREMITY MMT:  Bil hips grossly 3+/; knees 4+/5  PALPATION:   General  TTP in lumbar paraspinals and thoracic paraspinals                External Perineal Exam deferred until next session                             Internal Pelvic Floor deferred until next session   Patient confirms identification and approves PT to assess internal pelvic floor and treatment No  PELVIC MMT:   MMT eval  Vaginal   Internal Anal Sphincter   External Anal Sphincter   Puborectalis   Diastasis Recti   (Blank rows = not tested)        TONE: Deferred until next session   PROLAPSE: Deferred until next session   TODAY'S TREATMENT:                                                                                                                              DATE:   10/16/22 EVAL Examination completed, findings reviewed, pt educated on POC, knack and abdominal massage. Pt motivated to participate in PT and agreeable to attempt  recommendations.    12/23/22: 2x10 diaphragmatic breathing in hooklying  Lumbar rotation of trunk within tolerance 2x10 Single knee to chest 3x10 Windshield wipes x10 Butterfly 3x30s Educated on lubricants and moisturizers, HEP given and all reviewed     PATIENT EDUCATION:  Education details: knack and abdominal massage, [id number] Person educated: Patient Education method: Explanation, Demonstration, Tactile cues, Verbal cues, and Handouts Education comprehension: verbalized understanding and returned demonstration  HOME EXERCISE PROGRAM: [id number]  ASSESSMENT:  CLINICAL IMPRESSION: Patient presents for treatment, limited with period pain today. Pt agreeable to gentle mobility but declined internal until finished with period.  Pt tolerated pelvic floor relaxation technique and breathing mechanics well. Reported decreased pain at end of session. Pt reports continued pain with intercourse, urinary incontinence and irregular bowels. Pt educated on feminine moisturizers and lubricants for improved tissue mobility and decreased pain. Pt encouraged to discuss with medical provider with any further questions about these. Pt would benefit from additional PT to further address deficits.    OBJECTIVE IMPAIRMENTS: decreased coordination, decreased endurance, decreased mobility, decreased strength, increased fascial restrictions, increased muscle spasms, impaired flexibility, impaired tone, improper body mechanics, postural dysfunction, and pain.   ACTIVITY LIMITATIONS: carrying, lifting, bending, standing, squatting, and continence  PARTICIPATION LIMITATIONS: community activity  PERSONAL FACTORS: Time since onset of injury/illness/exacerbation and 1 comorbidity: medical history  are also affecting patient's functional outcome.   REHAB POTENTIAL: Good  CLINICAL DECISION MAKING: Evolving/moderate complexity  EVALUATION COMPLEXITY: Moderate   GOALS: Goals reviewed with patient? Yes  SHORT TERM GOALS: Target date: 11/13/22  Pt to be I with HEP.  Baseline: Goal status: INITIAL  2.  Pt to demonstrate at least 3/5 pelvic floor strength for improved pelvic stability and decreased strain at pelvic floor/ decrease leakage.   Baseline:  Goal status: INITIAL  3.  Pt to be I with breathing and voiding mechanics and abdominal massage for improved bowel habits.  Baseline:  Goal status: INITIAL  4.  Pt to be I with urge drill.  Baseline:  Goal status: INITIAL   LONG TERM GOALS: Target date: 02/15/23  Pt to be I with advanced HEP.  Baseline:  Goal status: INITIAL  2.  Pt to demonstrate at least 4/5 pelvic floor strength for improved pelvic stability and decreased strain at pelvic floor/ decrease leakage.  Baseline:  Goal  status: INITIAL  3.  Pt to demonstrate at least 5/5 bil hip strength for improved pelvic stability and functional squats without leakage.  Baseline:  Goal status: INITIAL  4.  Pt will have 25% less urgency due to bladder retraining and strengthening  Baseline:  Goal status: INITIAL  5.  Pt will report her BMs are complete due to improved bowel habits and evacuation techniques.  Baseline:  Goal status: INITIAL  PLAN:  PT FREQUENCY: 1x/week  PT DURATION:  10 sessions  PLANNED INTERVENTIONS: Therapeutic exercises, Therapeutic activity, Neuromuscular re-education, Patient/Family education, Self Care, Joint mobilization, Dry Needling, Cryotherapy, Moist heat, scar mobilization, Taping, Vasopneumatic device, Biofeedback, Manual therapy, and Re-evaluation  PLAN FOR NEXT SESSION: internal is pt consents, strengthening hip/core, coordination of pelvic floor    Otelia Sergeant, PT, DPT 11/18/244:44 PM

## 2022-12-23 NOTE — Patient Instructions (Signed)

## 2022-12-30 ENCOUNTER — Encounter: Payer: Self-pay | Admitting: Internal Medicine

## 2022-12-30 ENCOUNTER — Ambulatory Visit: Payer: MEDICAID | Attending: Internal Medicine | Admitting: Internal Medicine

## 2022-12-30 ENCOUNTER — Other Ambulatory Visit: Payer: Self-pay

## 2022-12-30 VITALS — BP 138/85 | HR 89 | Temp 97.6°F | Ht 66.0 in | Wt 311.0 lb

## 2022-12-30 DIAGNOSIS — I10 Essential (primary) hypertension: Secondary | ICD-10-CM

## 2022-12-30 DIAGNOSIS — Z23 Encounter for immunization: Secondary | ICD-10-CM

## 2022-12-30 DIAGNOSIS — F321 Major depressive disorder, single episode, moderate: Secondary | ICD-10-CM | POA: Diagnosis not present

## 2022-12-30 DIAGNOSIS — G4733 Obstructive sleep apnea (adult) (pediatric): Secondary | ICD-10-CM

## 2022-12-30 DIAGNOSIS — I7300 Raynaud's syndrome without gangrene: Secondary | ICD-10-CM | POA: Diagnosis not present

## 2022-12-30 MED ORDER — METOPROLOL TARTRATE 25 MG PO TABS
25.0000 mg | ORAL_TABLET | Freq: Two times a day (BID) | ORAL | 4 refills | Status: DC
Start: 2022-12-30 — End: 2022-12-30

## 2022-12-30 MED ORDER — METOPROLOL TARTRATE 25 MG PO TABS
25.0000 mg | ORAL_TABLET | Freq: Two times a day (BID) | ORAL | 4 refills | Status: DC
Start: 2022-12-30 — End: 2023-04-30
  Filled 2022-12-30: qty 60, 30d supply, fill #0

## 2022-12-30 NOTE — Progress Notes (Signed)
Patient ID: Kristen [name], female    DOB: 1985/05/24  MRN: [id number]  CC: Follow-up (Follow-up. Med refill./Discuss CPAP - requesting referral to pulmonology/Discuss metoprolol/Discuss skin on hands - "looks like colby jack cheese" Valentino Hue to flu & Tdap vax. )   Subjective: Kristen [name] is a 37 y.o. female who presents for chronic ds management.  Mom is with her. Her concerns today include:  Pt with hx ofanx/dep, IBS,  PCOS, morbid obesity, Dep, RLS, social phobia, lymphocytic hypophysitis, hyperprolactinemia, severe persistent asthma, migraines, OSA CPAP, RLS, Elhers Danlo.   Needing RF on BP med Lopressor which she was started on by cardiology nurse practitioner in June; never filled it.  She had placed her on this and told her to use the propranolol as needed Checks BP occasionally; usually high when stress Limits salt in foods  Hx of OSA:  Off CPAP for a while due to insurance reason.  Thinks pressure too high now and needs to be adjust.  Had not been using it.  Wants to get back on it.  Other household members report her snoring is loud.  Has non-restorative sleep.   Hands and feet looks like colby jack cheese when exposed to cold or if hanging down for too long.  Nails get blue when exposed to cold; hand being happening since she was a child  Positive depression screen today.  She is plugged in with behavioral health with NP [place].  Recently had a lot going on and some death in the family.  Plans to request a therapist as well as current provider does mainly medication management.  Patient Active Problem List   Diagnosis Date Noted   Diarrhea    Chronic diarrhea    Weight gain 11/01/2020   Hirsutism 11/01/2020   Gastroesophageal reflux disease without esophagitis 07/11/2020   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   POTS (postural orthostatic tachycardia syndrome) 06/09/2020   Morbid obesity (HCC) 12/14/2019   Endometrial polyp    Encounter for autism screening  11/18/2019   DUB (dysfunctional uterine bleeding) 11/08/2019   PTSD (post-traumatic stress disorder) 09/20/2019   Irritable bowel syndrome with diarrhea 04/23/2019   Abnormal serum thyroid stimulating hormone (TSH) level 04/23/2019   OSA on CPAP 03/17/2019   Anxiety and depression 03/12/2019   Chronic migraine with aura 03/12/2019   Essential hypertension 03/12/2019   Mild intermittent asthma without complication 03/12/2019   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   Elevated prolactin level 07/05/2016   Vulvar lesion 07/05/2016   Restless legs 05/24/2016   Seasonal allergies 05/13/2014   Intertrigo 05/13/2014   Decreased vision 05/13/2014   PCOS (polycystic ovarian syndrome) 02/07/2014   Social phobia 02/02/2007   Attention deficit disorder 02/02/2007   DYSLEXIA 02/02/2007   Asthma, severe persistent 02/02/2007   Idiopathic scoliosis and kyphoscoliosis 02/02/2007   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 18 g 1   Benzocaine-Resorcinol (VAGISIL EX) Apply 1 application. topically as needed.     busPIRone (BUSPAR) 10 MG tablet Take 1 tablet (10 mg total) by mouth 3 (three) times daily. 90 tablet 3   colestipol (COLESTID) 1 g tablet Take 2 tablets (2 g total) by mouth 2 (two) times daily. 120 tablet 0  diclofenac Sodium (VOLTAREN) 1 % GEL Apply 2 g topically 4 (four) times daily. (Patient taking differently: Apply 2 g topically as needed.) 100 g 0   hydrOXYzine (ATARAX) 25 MG tablet Take 1 tablet (25 mg total) by mouth 4 (four) times daily. 120 tablet 3   ipratropium-albuterol (DUONEB) 0.5-2.5 (3) MG/3ML SOLN Take 3 mLs by nebulization every 4 (four) hours as needed.     loperamide  (IMODIUM A-D) 2 MG tablet Take 2 mg by mouth 4 (four) times daily as needed for diarrhea or loose stools.     medroxyPROGESTERone (PROVERA) 5 MG tablet Take 2 tablets (10 mg total) by mouth daily. For 15 days for amenorrhea 30 tablet 1   mupirocin ointment (BACTROBAN) 2 % Apply 1 Application topically 2 (two) times daily. 22 g 0   naproxen (NAPROSYN) 375 MG tablet Take 1 tablet (375 mg total) by mouth 2 (two) times daily with a meal. 20 tablet 0   omeprazole (PRILOSEC) 40 MG capsule Take 1 capsule (40 mg total) by mouth daily before breakfast. 90 capsule 0   propranolol (INDERAL) 10 MG tablet Take 2 tablets (20 mg total) by mouth 3 (three) times daily as needed (palpitations). 60 tablet 11   QUEtiapine (SEROQUEL) 50 MG tablet Take 1 tablet (50 mg total) by mouth at bedtime. 30 tablet 3   sertraline (ZOLOFT) 100 MG tablet Take 2 tablets (200 mg total) by mouth at bedtime. 60 tablet 3   traZODone (DESYREL) 100 MG tablet Take 1 tablet (100 mg total) by mouth at bedtime as needed for sleep. 30 tablet 3   fluticasone (FLONASE) 50 MCG/ACT nasal spray Place 2 sprays into both nostrils daily. (Patient not taking: Reported on 12/30/2022) 16 g 0   gabapentin (NEURONTIN) 300 MG capsule TAKE 1 CAPSULE (300 MG TOTAL) BY MOUTH AT BEDTIME. (Patient taking differently: Take 300 mg by mouth as needed (pain).) 30 capsule 3   ondansetron (ZOFRAN-ODT) 4 MG disintegrating tablet Take 1 tablet (4 mg total) by mouth every 8 (eight) hours as needed for nausea or vomiting. (Patient not taking: Reported on 12/30/2022) 20 tablet 0   promethazine-dextromethorphan (PROMETHAZINE-DM) 6.25-15 MG/5ML syrup Take 5 mLs by mouth 4 (four) times daily as needed for cough. (Patient not taking: Reported on 12/30/2022) 118 mL 0   No current facility-administered medications on file prior to visit.    Allergies  Allergen Reactions   Mucinex [Guaifenesin Er] Hives, Itching and Swelling   Penicillins Swelling    Did it involve swelling of  the face/tongue/throat, SOB, or low BP? Yes Did it involve sudden or severe rash/hives, skin peeling, or any reaction on the inside of your mouth or nose? No Did you need to seek medical attention at a hospital or doctor's office? Yes When did it last happen?      >10 years ago If all above answers are "NO", may proceed with cephalosporin use.    Contrast Media [Iodinated Contrast Media] Nausea And Vomiting   Latex Hives and Swelling   Multihance [Gadobenate] Nausea And Vomiting   Other     Pickles - throat swelling and nausea     Social History   Socioeconomic History   Marital status: Single    Spouse name: Not on file   Number of children: 0   Years of education: college    Highest education level: Not on file  Occupational History   Occupation: Unemployed   Tobacco Use   Smoking status: Never    Passive exposure:  Current   Smokeless tobacco: Never  Vaping Use   Vaping status: Never Used  Substance and Sexual Activity   Alcohol use: No   Drug use: No   Sexual activity: Yes    Birth control/protection: None  Other Topics Concern   Not on file  Social History Narrative   Lives with mom Steward Drone)    Right-handed   Caffeine: 3 cups of coffee per day   Social Determinants of Health   Financial Resource Strain: High Risk (12/30/2022)   Overall Financial Resource Strain (CARDIA)    Difficulty of Paying Living Expenses: Hard  Food Insecurity: Food Insecurity Present (12/30/2022)   Hunger Vital Sign    Worried About Running Out of Food in the Last Year: Often true    Ran Out of Food in the Last Year: Sometimes true  Transportation Needs: No Transportation Needs (12/30/2022)   PRAPARE - Administrator, Civil Service (Medical): No    Lack of Transportation (Non-Medical): No  Physical Activity: Insufficiently Active (12/30/2022)   Exercise Vital Sign    Days of Exercise per Week: 2 days    Minutes of Exercise per Session: 10 min  Stress: Stress Concern  Present (12/30/2022)   [employer] of Occupational Health - Occupational Stress Questionnaire    Feeling of Stress : Rather much  Social Connections: Socially Isolated (12/30/2022)   Social Connection and Isolation Panel [NHANES]    Frequency of Communication with Friends and Family: Once a week    Frequency of Social Gatherings with Friends and Family: Once a week    Attends Religious Services: Never    Database administrator or Organizations: No    Attends Banker Meetings: Never    Marital Status: Never married  Catering manager Violence: Not At Risk (12/30/2022)   Humiliation, Afraid, Rape, and Kick questionnaire    Fear of Current or Ex-Partner: No    Emotionally Abused: No    Physically Abused: No    Sexually Abused: No    Family History  Problem Relation Age of Onset   Hypertension Mother    Arnold-Chiari malformation Mother    Restless legs syndrome Mother    Osteoporosis Mother    COPD Mother    Asthma Mother    Squamous cell carcinoma Mother        skin    Eczema Mother    Arthritis Mother    Peripheral Artery Disease Mother    Anxiety disorder Mother    OCD Mother    Colonic polyp Mother    Stroke Mother    Hypertension Father    Scoliosis Father    Bipolar disorder Father    Congestive Heart Failure Father    COPD Father    Depression Father    Diabetes Maternal Grandmother    Hypertension Maternal Grandmother    Dementia Maternal Grandmother    OCD Maternal Grandmother    Alzheimer's disease Maternal Grandmother    Bone cancer Maternal Grandfather 31       bone marrow    Multiple myeloma Maternal Grandfather    Alcohol abuse Maternal Grandfather    Drug abuse Maternal Grandfather    Cancer Paternal Grandmother        colon   Diabetes Paternal Grandfather    Alcohol abuse Paternal Grandfather    Drug abuse Paternal Grandfather    Dementia Paternal Grandfather    Alcohol abuse Maternal Aunt    Depression Maternal Aunt  Pancreatic cancer Maternal Aunt    Alcohol abuse Paternal Aunt    Depression Paternal Aunt    ADD / ADHD Cousin     Past Surgical History:  Procedure Laterality Date   BIOPSY  06/11/2021   Procedure: BIOPSY;  Surgeon: Napoleon Form, MD;  Location: [facility];  Service: Gastroenterology;;   BLADDER SURGERY     CHOLECYSTECTOMY  2003   COLONOSCOPY WITH PROPOFOL N/A 06/11/2021   Procedure: COLONOSCOPY WITH PROPOFOL;  Surgeon: Napoleon Form, MD;  Location: [facility];  Service: Gastroenterology;  Laterality: N/A;   DILATATION & CURETTAGE/HYSTEROSCOPY WITH MYOSURE N/A 12/14/2019   Procedure: DILATATION & CURETTAGE/HYSTEROSCOPY WITH Polypectomy with MYOSURE;  Surgeon: Tereso Newcomer, MD;  Location: [facility];  Service: Gynecology;  Laterality: N/A;   MOHS SURGERY Left 11/2022   Right side and left leg   OPEN REDUCTION INTERNAL FIXATION (ORIF) PROXIMAL PHALANX Right 07/23/2013   Procedure: OPEN TREATMENT RIGHT RING FINGER, PROXIMAL INTERPHALANGEAL/JOINT FRACTURE DISLOCATION;  Surgeon: Jodi Marble, MD;  Location:  SURGERY CENTER;  Service: Orthopedics;  Laterality: Right;   TONSILLECTOMY     and adenoidectomy    ROS: Review of Systems Negative except as stated above  PHYSICAL EXAM: BP 138/85 (BP Location: Left Arm, Patient Position: Sitting, Cuff Size: Large)   Pulse 89   Temp 97.6 F (36.4 C) (Oral)   Ht 5\' 6"  (1.676 m)   Wt (!) 311 lb (141.1 kg)   LMP 12/17/2022   SpO2 96%   BMI 50.20 kg/m   Physical Exam  General appearance - alert, well appearing, morbidly obese Caucasian female and in no distress Mental status - normal mood, behavior, speech, dress, motor activity, and thought processes Chest - clear to auscultation, no wheezes, rales or rhonchi, symmetric air entry Heart - normal rate, regular rhythm, normal S1, S2, no murmurs, rubs, clicks or gallops Extremities -good radial pulses.  No erythema or cyanosis noted of the hands.     12/30/2022     3:09 PM 11/20/2022    9:13 AM 10/01/2022    8:40 AM  Depression screen PHQ 2/9  Decreased Interest 1 2 2   Down, Depressed, Hopeless 1 2 2   PHQ - 2 Score 2 4 4   Altered sleeping 3 3 2   Tired, decreased energy 3 3 1   Change in appetite 0 1 0  Feeling bad or failure about yourself  3 3 3   Trouble concentrating 0 1 1  Moving slowly or fidgety/restless 0 2 2  Suicidal thoughts 1 0 0  PHQ-9 Score 12 17 13   Difficult doing work/chores Somewhat difficult Somewhat difficult       12/30/2022    3:09 PM 11/20/2022    9:15 AM 10/01/2022    8:40 AM 08/27/2022    4:45 PM  GAD 7 : Generalized Anxiety Score  Nervous, Anxious, on Edge 2 2 3 3   Control/stop worrying 3 3 3 3   Worry too much - different things 2 3 2 3   Trouble relaxing 1 1 1  0  Restless 3 1 1 1   Easily annoyed or irritable 2 1 1 2   Afraid - awful might happen 3 2 2 3   Total GAD 7 Score 16 13 13 15   Anxiety Difficulty Very difficult Somewhat difficult          Latest Ref Rng & Units 06/19/2022    8:14 AM 06/18/2022   10:25 AM 06/11/2021    8:58 AM  CMP  Glucose 70 - 99 mg/dL  106  85  95   BUN 6 - 23 mg/dL 12  12  7    Creatinine 0.40 - 1.20 mg/dL 1.61  0.96  0.45   Sodium 135 - 145 mEq/L 139  140  140   Potassium 3.5 - 5.1 mEq/L 3.5  3.3  4.4   Chloride 96 - 112 mEq/L 100  101  104   CO2 19 - 32 mEq/L 30  30    Calcium 8.4 - 10.5 mg/dL 9.5  8.9    Total Protein 6.0 - 8.3 g/dL  6.5    Total Bilirubin 0.2 - 1.2 mg/dL  0.6    Alkaline Phos 39 - 117 U/L  110    AST 0 - 37 U/L  14    ALT 0 - 35 U/L  13     Lipid Panel     Component Value Date/Time   CHOL 177 06/18/2022 1025   TRIG 128.0 06/18/2022 1025   HDL 42.40 06/18/2022 1025   CHOLHDL 4 06/18/2022 1025   VLDL 25.6 06/18/2022 1025   LDLCALC 109 (H) 06/18/2022 1025    CBC    Component Value Date/Time   WBC 9.5 06/27/2022 1434   WBC 9.3 01/07/2021 0723   RBC 4.72 06/27/2022 1434   RBC 4.89 01/07/2021 0723   HGB 13.3 06/27/2022 1434   HCT 39.9 06/27/2022  1434   PLT 278 06/27/2022 1434   MCV 85 06/27/2022 1434   MCH 28.2 06/27/2022 1434   MCH 27.6 01/07/2021 0723   MCHC 33.3 06/27/2022 1434   MCHC 31.5 01/07/2021 0723   RDW 13.5 06/27/2022 1434   LYMPHSABS 3.0 06/27/2022 1434   MONOABS 0.4 01/07/2021 0723   EOSABS 0.2 06/27/2022 1434   BASOSABS 0.1 06/27/2022 1434    ASSESSMENT AND PLAN: 1. Essential hypertension Not at goal.  Prescription sent for metoprolol which was prescribed for her by cardiology.  She will start taking it.  Encouraged to check blood pressure regularly.  Goal is 130/80 or lower. - metoprolol tartrate (LOPRESSOR) 25 MG tablet; Take 1 tablet (25 mg total) by mouth 2 (two) times daily.  Dispense: 60 tablet; Refill: 4  2. Moderate major depression (HCC) She is plugged in with behavioral health.  Next time she sees her provider she will ask about getting a counselor  3. OSA (obstructive sleep apnea) Looks like sleep study was done through neurology by Dr. Frances Furbish.  Will get her back in with her.  Advised patient to take her CPAP machine with her when she goes to the visit.  Pressure may need to be adjusted versus having a new sleep study since it has been several years. - Ambulatory referral to Neurology  4. Raynaud's phenomenon without gangrene Discussed this diagnosis with her.  Advised wearing gloves when she anticipates being in cold weather  5. Encounter for immunization - Flu vaccine trivalent PF, 6mos and older(Flulaval,Afluria,Fluarix,Fluzone) - Tdap vaccine greater than or equal to 7yo IM    Patient was given the opportunity to ask questions.  Patient verbalized understanding of the plan and was able to repeat key elements of the plan.   This documentation was completed using Paediatric nurse.  Any transcriptional errors are unintentional.  Orders Placed This Encounter  Procedures   Flu vaccine trivalent PF, 6mos and older(Flulaval,Afluria,Fluarix,Fluzone)   Tdap vaccine greater than  or equal to 7yo IM   Ambulatory referral to Neurology     Requested Prescriptions   Signed Prescriptions Disp Refills  metoprolol tartrate (LOPRESSOR) 25 MG tablet 60 tablet 4    Sig: Take 1 tablet (25 mg total) by mouth 2 (two) times daily.    Return in about 4 months (around 04/29/2023).  Jonah Blue, MD, FACP

## 2023-01-08 ENCOUNTER — Ambulatory Visit: Payer: MEDICAID | Admitting: Physician Assistant

## 2023-01-15 ENCOUNTER — Encounter (HOSPITAL_BASED_OUTPATIENT_CLINIC_OR_DEPARTMENT_OTHER): Payer: Self-pay | Admitting: Obstetrics & Gynecology

## 2023-01-16 ENCOUNTER — Other Ambulatory Visit: Payer: Self-pay

## 2023-01-16 MED ORDER — SERTRALINE HCL 100 MG PO TABS
200.0000 mg | ORAL_TABLET | Freq: Every day | ORAL | 3 refills | Status: DC
Start: 2023-01-16 — End: 2023-03-21
  Filled 2023-01-16: qty 60, 30d supply, fill #0

## 2023-01-16 MED ORDER — TRAZODONE HCL 100 MG PO TABS
100.0000 mg | ORAL_TABLET | Freq: Every evening | ORAL | 3 refills | Status: DC | PRN
  Filled 2023-01-16: qty 30, 30d supply, fill #0

## 2023-01-16 MED ORDER — HYDROXYZINE HCL 25 MG PO TABS
25.0000 mg | ORAL_TABLET | Freq: Four times a day (QID) | ORAL | 3 refills | Status: DC
  Filled 2023-01-16: qty 120, 30d supply, fill #0

## 2023-01-16 MED ORDER — BUSPIRONE HCL 10 MG PO TABS
10.0000 mg | ORAL_TABLET | Freq: Three times a day (TID) | ORAL | 3 refills | Status: DC
  Filled 2023-01-16: qty 90, 30d supply, fill #0

## 2023-01-16 MED ORDER — QUETIAPINE FUMARATE 100 MG PO TABS
100.0000 mg | ORAL_TABLET | Freq: Every day | ORAL | 3 refills | Status: DC
Start: 2023-01-16 — End: 2023-03-21
  Filled 2023-01-16: qty 30, 30d supply, fill #0

## 2023-01-16 NOTE — Progress Notes (Signed)
BH MD/PA/NP OP Progress Note Virtual Visit via Video Note  I connected with Kristen [name] on 01/17/23 at 10:30 AM EST by a video enabled telemedicine application and verified that I am speaking with the correct person using two identifiers.  Location: Patient: Home Provider: Clinic   I discussed the limitations of evaluation and management by telemedicine and the availability of in person appointments. The patient expressed understanding and agreed to proceed.  I provided 30 minutes of non-face-to-face time during this encounter.      01/17/2023 8:21 AM Kristen [name]  MRN:  [id number]  Chief Complaint: "Things could be better"  HPI:  37 year old female seen today for follow up psychiatric evaluation.  She has a psychiatric history of social phobia, ADD, anxiety, depression, and PTSD.  She is currently being managed on Seroquel  50 mg nightly, Buspar 10 mg three times daily,  Zoloft 200 mg daily, trazodone 50 mg nightly as needed, hydroxyzine 25 mg 4 times a day as needed, gabapentin 300 mg nightly (prescribed by PCP), and propanolol 20 mg 2 times daily (recives from PCP).  She notes that her medications are somewhat effective in managing her psychiatric conditions.   Today patient was well-groomed, pleasant, cooperative, engaged in conversation, and maintained eye contact.  She informed provider that things could be better. She notes that her mother health is not the best. She notes that she had back surgery and almost overdosed. She notes that she found her mother unresponsive. She also notes that her father had his toe amputated and was allergic to the medication. She also notes that her cat was sick. Patient notes that politics has been stressful. She notes that she is glad that the election is over.   Patient notes that the above causes her to disassociate at least 3 times a week. She also notes that the above exacerbates negative self talk, anxiety, and depression.   Today provider conducted a GAD 7  and patient scored an  18, at her last visit she scored an 13. She notes that socially she fears going out. She also notes that she fears her boyfriend will leave her. She has negative thoughts about her being unworthy.  Provider also conducted PHQ-9 and patient scored a 22, at her last visit she scored a 17.  She endorses adequate appetite. She notes that at times her sleep fluctuates. Patient notes that she having racing thoughts, irritable, fluctions in mood, and paranoia.    Today Seroquel 50 mg increased to 100 mg to help manage mood, anxiety, and depression.  She will continue her other medications as prescribed.  No other concerns noted at this time.      Visit Diagnosis:    ICD-10-CM   1. Social phobia  F40.10 busPIRone (BUSPAR) 10 MG tablet    hydrOXYzine (ATARAX) 25 MG tablet    traZODone (DESYREL) 100 MG tablet    sertraline (ZOLOFT) 100 MG tablet    2. Moderate episode of recurrent major depressive disorder (HCC)  F33.1 busPIRone (BUSPAR) 10 MG tablet    traZODone (DESYREL) 100 MG tablet    sertraline (ZOLOFT) 100 MG tablet    QUEtiapine (SEROQUEL) 100 MG tablet    3. Anxiety and depression  F41.9    F32.A          Past Psychiatric History: social phobia, ADD, anxiety, depression, and PTSD.  Past Medical History:  Past Medical History:  Diagnosis Date   Anxiety 2013   treated at [facility]  by Deatra Robinson    Asthma 1989   no hospitalization, or intubation, previously on advair and singulair. asthma worsening.    Chronic diarrhea    Follows w/ [facility].   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   Dysfunctional uterine bleeding    Dysrhythmia 2010   PVC's   Ehlers-Danlos syndrome    Essential hypertension 03/12/2019   GERD (gastroesophageal reflux disease)    History of hiatal hernia    History of pituitary adenoma    History of  suicidal ideation    IBS (irritable bowel syndrome)    Insulin resistance    Follows w/ endocrinology.   Kyphosis of thoracic region 2004   painful, runs in dad side    Lymphocytic hypophysitis (HCC)    Follows w/ endocrinology.   Migraine    Orthostatic hypotension    Follows w/ [facility], Eligha Bridegroom, NP.   Pancreatitis 2003   gallstone pancreatitis    Pancreatitis due to biliary obstruction 2003   PCOS (polycystic ovarian syndrome) 2014   facial hair, irregular periods, never been pregnant    Sleep apnea    no longer uses cpap   Social phobia    Follows w/ Behavioral Health.    Past Surgical History:  Procedure Laterality Date   BIOPSY  06/11/2021   Procedure: BIOPSY;  Surgeon: Napoleon Form, MD;  Location: [facility];  Service: Gastroenterology;;   BLADDER SURGERY     CHOLECYSTECTOMY  2003   COLONOSCOPY WITH PROPOFOL N/A 06/11/2021   Procedure: COLONOSCOPY WITH PROPOFOL;  Surgeon: Napoleon Form, MD;  Location: [facility];  Service: Gastroenterology;  Laterality: N/A;   DILATATION & CURETTAGE/HYSTEROSCOPY WITH MYOSURE N/A 12/14/2019   Procedure: DILATATION & CURETTAGE/HYSTEROSCOPY WITH Polypectomy with MYOSURE;  Surgeon: Tereso Newcomer, MD;  Location: [facility];  Service: Gynecology;  Laterality: N/A;   MOHS SURGERY Left 11/2022   Right side and left leg   OPEN REDUCTION INTERNAL FIXATION (ORIF) PROXIMAL PHALANX Right 07/23/2013   Procedure: OPEN TREATMENT RIGHT RING FINGER, PROXIMAL INTERPHALANGEAL/JOINT FRACTURE DISLOCATION;  Surgeon: Jodi Marble, MD;  Location: [facility];  Service: Orthopedics;  Laterality: Right;   TONSILLECTOMY     and adenoidectomy    Family Psychiatric History: Mother PTSD, Father Bipolar disorder and PTSD  Family History:  Family History  Problem Relation Age of Onset   Hypertension Mother    Arnold-Chiari malformation Mother    Restless legs syndrome Mother    Osteoporosis Mother    COPD  Mother    Asthma Mother    Squamous cell carcinoma Mother        skin    Eczema Mother    Arthritis Mother    Peripheral Artery Disease Mother    Anxiety disorder Mother    OCD Mother    Colonic polyp Mother    Stroke Mother    Hypertension Father    Scoliosis Father    Bipolar disorder Father    Congestive Heart Failure Father    COPD Father    Depression Father    Diabetes Maternal Grandmother    Hypertension Maternal Grandmother    Dementia Maternal Grandmother    OCD Maternal Grandmother    Alzheimer's disease Maternal Grandmother    Bone cancer Maternal Grandfather 46       bone marrow    Multiple myeloma  Maternal Grandfather    Alcohol abuse Maternal Grandfather    Drug abuse Maternal Grandfather    Cancer Paternal Grandmother        colon   Diabetes Paternal Grandfather    Alcohol abuse Paternal Grandfather    Drug abuse Paternal Grandfather    Dementia Paternal Grandfather    Alcohol abuse Maternal Aunt    Depression Maternal Aunt    Pancreatic cancer Maternal Aunt    Alcohol abuse Paternal Aunt    Depression Paternal Aunt    ADD / ADHD Cousin     Social History:  Social History   Socioeconomic History   Marital status: Single    Spouse name: Not on file   Number of children: 0   Years of education: college    Highest education level: Not on file  Occupational History   Occupation: Unemployed   Tobacco Use   Smoking status: Never    Passive exposure: Current   Smokeless tobacco: Never  Vaping Use   Vaping status: Never Used  Substance and Sexual Activity   Alcohol use: No   Drug use: No   Sexual activity: Yes    Birth control/protection: None  Other Topics Concern   Not on file  Social History Narrative   Lives with mom Steward Drone)    Right-handed   Caffeine: 3 cups of coffee per day   Social Drivers of Health   Financial Resource Strain: High Risk (12/30/2022)   Overall Financial Resource Strain (CARDIA)    Difficulty of Paying Living  Expenses: Hard  Food Insecurity: Food Insecurity Present (12/30/2022)   Hunger Vital Sign    Worried About Running Out of Food in the Last Year: Often true    Ran Out of Food in the Last Year: Sometimes true  Transportation Needs: No Transportation Needs (12/30/2022)   PRAPARE - Administrator, Civil Service (Medical): No    Lack of Transportation (Non-Medical): No  Physical Activity: Insufficiently Active (12/30/2022)   Exercise Vital Sign    Days of Exercise per Week: 2 days    Minutes of Exercise per Session: 10 min  Stress: Stress Concern Present (12/30/2022)   [employer] of Occupational Health - Occupational Stress Questionnaire    Feeling of Stress : Rather much  Social Connections: Socially Isolated (12/30/2022)   Social Connection and Isolation Panel [NHANES]    Frequency of Communication with Friends and Family: Once a week    Frequency of Social Gatherings with Friends and Family: Once a week    Attends Religious Services: Never    Database administrator or Organizations: No    Attends Banker Meetings: Never    Marital Status: Never married    Allergies:  Allergies  Allergen Reactions   Latex Hives and Swelling   Mucinex [Guaifenesin Er] Hives, Itching and Swelling   Other Anaphylaxis and Nausea Only    Pickles - throat swelling and nausea    Penicillins Swelling    Did it involve swelling of the face/tongue/throat, SOB, or low BP? Yes Did it involve sudden or severe rash/hives, skin peeling, or any reaction on the inside of your mouth or nose? No Did you need to seek medical attention at a hospital or doctor's office? Yes When did it last happen?      >10 years ago If all above answers are "NO", may proceed with cephalosporin use.    Contrast Media [Iodinated Contrast Media] Nausea And Vomiting   Multihance [Gadobenate]  Nausea And Vomiting    Metabolic Disorder Labs: Lab Results  Component Value Date   HGBA1C 5.2 06/18/2022    MPG 93.93 04/15/2019   Lab Results  Component Value Date   PROLACTIN 10.7 06/19/2022   PROLACTIN 15.3 11/01/2020   Lab Results  Component Value Date   CHOL 177 06/18/2022   TRIG 128.0 06/18/2022   HDL 42.40 06/18/2022   CHOLHDL 4 06/18/2022   VLDL 25.6 06/18/2022   LDLCALC 109 (H) 06/18/2022   LDLCALC 134 (H) 04/15/2019   Lab Results  Component Value Date   TSH 3.01 06/18/2022   TSH 2.99 11/01/2020    Therapeutic Level Labs: No results found for: "LITHIUM" No results found for: "VALPROATE" No results found for: "CBMZ"  Current Medications: Current Outpatient Medications  Medication Sig Dispense Refill   albuterol (VENTOLIN HFA) 108 (90 Base) MCG/ACT inhaler Inhale 2 puffs into the lungs every 6 (six) hours as needed for wheezing or shortness of breath. 18 g 1   Benzocaine-Resorcinol (VAGISIL EX) Apply 1 application. topically as needed.     busPIRone (BUSPAR) 10 MG tablet Take 1 tablet (10 mg total) by mouth 3 (three) times daily. 90 tablet 3   colestipol (COLESTID) 1 g tablet Take 2 tablets (2 g total) by mouth 2 (two) times daily. 120 tablet 0   diclofenac Sodium (VOLTAREN) 1 % GEL Apply 2 g topically 4 (four) times daily. (Patient taking differently: Apply 2 g topically as needed.) 100 g 0   fluticasone (FLONASE) 50 MCG/ACT nasal spray Place 2 sprays into both nostrils daily. (Patient not taking: Reported on 12/30/2022) 16 g 0   gabapentin (NEURONTIN) 300 MG capsule TAKE 1 CAPSULE (300 MG TOTAL) BY MOUTH AT BEDTIME. (Patient taking differently: Take 300 mg by mouth as needed (pain).) 30 capsule 3   hydrOXYzine (ATARAX) 25 MG tablet Take 1 tablet (25 mg total) by mouth 4 (four) times daily. 120 tablet 3   ipratropium-albuterol (DUONEB) 0.5-2.5 (3) MG/3ML SOLN Take 3 mLs by nebulization every 4 (four) hours as needed.     loperamide (IMODIUM A-D) 2 MG tablet Take 2 mg by mouth 4 (four) times daily as needed for diarrhea or loose stools.     medroxyPROGESTERone (PROVERA) 5 MG  tablet Take 2 tablets (10 mg total) by mouth daily. For 15 days for amenorrhea 30 tablet 1   metoprolol tartrate (LOPRESSOR) 25 MG tablet Take 1 tablet (25 mg total) by mouth 2 (two) times daily. 60 tablet 4   mupirocin ointment (BACTROBAN) 2 % Apply 1 Application topically 2 (two) times daily. 22 g 0   naproxen (NAPROSYN) 375 MG tablet Take 1 tablet (375 mg total) by mouth 2 (two) times daily with a meal. 20 tablet 0   omeprazole (PRILOSEC) 40 MG capsule Take 1 capsule (40 mg total) by mouth daily before breakfast. 90 capsule 0   ondansetron (ZOFRAN-ODT) 4 MG disintegrating tablet Take 1 tablet (4 mg total) by mouth every 8 (eight) hours as needed for nausea or vomiting. (Patient not taking: Reported on 12/30/2022) 20 tablet 0   promethazine-dextromethorphan (PROMETHAZINE-DM) 6.25-15 MG/5ML syrup Take 5 mLs by mouth 4 (four) times daily as needed for cough. (Patient not taking: Reported on 12/30/2022) 118 mL 0   propranolol (INDERAL) 10 MG tablet Take 2 tablets (20 mg total) by mouth 3 (three) times daily as needed (palpitations). 60 tablet 11   QUEtiapine (SEROQUEL) 100 MG tablet Take 1 tablet (100 mg total) by mouth at bedtime. 30 tablet 3  sertraline (ZOLOFT) 100 MG tablet Take 2 tablets (200 mg total) by mouth at bedtime. 60 tablet 3   traZODone (DESYREL) 100 MG tablet Take 1 tablet (100 mg total) by mouth at bedtime as needed for sleep. 30 tablet 3   No current facility-administered medications for this visit.     Musculoskeletal: Strength & Muscle Tone: within normal limits and telehealth visit Gait & Station: normal, telehealth visit Patient leans: N/A  Psychiatric Specialty Exam: Review of Systems  Last menstrual period 12/17/2022.There is no height or weight on file to calculate BMI.  General Appearance: Well Groomed  Eye Contact:  Good  Speech:  Clear and Coherent and Normal Rate  Volume:  Normal  Mood:  Anxious and Depressed  Affect:  Congruent  Thought Process:  Coherent,  Goal Directed and Linear  Orientation:  Full (Time, Place, and Person)  Thought Content: Logical and Paranoid Ideation   Suicidal Thoughts:  No  Homicidal Thoughts:  No  Memory:  Immediate;   Good Recent;   Good Remote;   Good  Judgement:  Good  Insight:  Good  Psychomotor Activity:  Normal  Concentration:  Concentration: Good and Attention Span: Good  Recall:  Good  Fund of Knowledge: Good  Language: Good  Akathisia:  No  Handed:  Right  AIMS (if indicated): Not done  Assets:  Communication Skills Desire for Improvement Financial Resources/Insurance Housing Social Support  ADL's:  Intact  Cognition: WNL  Sleep:  Good   Screenings: AIMS    Flowsheet Row [facility] (Dis[facility]  AIMS Total Score 0      AUDIT    Flowsheet Row Admission (Discharged) from [facility]  Alcohol Use Disorder Identification Test Final Score (AUDIT) 0      GAD-7    Flowsheet Row Video Visit from 01/17/2023 in [facility] Office Visit from 12/30/2022 in [facility] [place] [facility] [place]. [facility] Video Visit from 11/20/2022 in [facility] Office Visit from 10/01/2022 in [facility] Office Visit from 08/27/2022 in [facility]  Total GAD-7 Score 18 16 13 13 15       PHQ2-9    Flowsheet Row Video Visit from 01/17/2023 in [facility] Office Visit from 12/30/2022 in [facility] [place] [facility] Of [place]. [facility] Video Visit from 11/20/2022 in [facility] Office Visit from 10/01/2022 in Center for [facility] at [facility] Office Visit from 08/27/2022 in [facility]  PHQ-2 Total Score 3 2 4 4 5   PHQ-9 Total Score 22 12 17 13 14       Flowsheet Row Video Visit from 01/17/2023 in [facility] ED from 12/06/2022 in [facility] at [facility]) ED from 09/13/2022 in [facility] at [facility])  C-SSRS RISK CATEGORY Error: Q7 should not be populated when Q6 is No No Risk No Risk        Assessment and Plan: Patient notes that she continues to be anxious, depressed, and somewhat paranoid.Today Seroquel 50 mg increased to 100 mg to help manage mood, anxiety, and depression.  She will continue her other medications as prescribed.  1. Social phobia  Continue- busPIRone (BUSPAR) 10 MG tablet; Take 1 tablet (10 mg total) by mouth 3 (three) times daily.  Dispense: 90 tablet; Refill: 3 Continue- hydrOXYzine (ATARAX) 25 MG tablet; Take 1 tablet (25 mg total) by mouth 4 (four) times daily.  Dispense: 120 tablet; Refill: 3 Continue- traZODone (DESYREL) 100 MG tablet; Take 1 tablet (100 mg total) by mouth at bedtime as needed for sleep.  Dispense: 30 tablet; Refill: 3 Continue- sertraline (ZOLOFT) 100 MG tablet; Take 2 tablets (200 mg total) by mouth at bedtime.  Dispense: 60 tablet; Refill: 3  2. Moderate episode of recurrent major depressive disorder (HCC)  Continue- busPIRone (BUSPAR) 10 MG tablet; Take 1 tablet (10 mg total) by mouth 3 (three) times daily.  Dispense: 90 tablet; Refill: 3 Continue- traZODone (DESYREL) 100 MG tablet; Take 1 tablet (100 mg total) by mouth at bedtime as needed for sleep.  Dispense: 30 tablet; Refill: 3 Continue- sertraline (ZOLOFT) 100 MG tablet; Take 2 tablets (200 mg total) by mouth at bedtime.  Dispense: 60 tablet; Refill: 3 Increased- QUEtiapine (SEROQUEL) 100 MG tablet; Take 1 tablet (100 mg total) by mouth at bedtime.  Dispense: 30 tablet; Refill: 3      Follow-up in 2 months Follow-up  therapy Shanna Cisco, NP 01/17/2023, 8:21 AM

## 2023-01-17 ENCOUNTER — Other Ambulatory Visit: Payer: Self-pay

## 2023-01-17 ENCOUNTER — Telehealth (HOSPITAL_COMMUNITY): Payer: MEDICAID | Admitting: Psychiatry

## 2023-01-17 ENCOUNTER — Encounter (HOSPITAL_BASED_OUTPATIENT_CLINIC_OR_DEPARTMENT_OTHER): Payer: Self-pay | Admitting: Obstetrics & Gynecology

## 2023-01-17 ENCOUNTER — Encounter (HOSPITAL_COMMUNITY): Payer: Self-pay | Admitting: Psychiatry

## 2023-01-17 DIAGNOSIS — F331 Major depressive disorder, recurrent, moderate: Secondary | ICD-10-CM

## 2023-01-17 DIAGNOSIS — F4010 Social phobia, unspecified: Secondary | ICD-10-CM

## 2023-01-17 DIAGNOSIS — F419 Anxiety disorder, unspecified: Secondary | ICD-10-CM

## 2023-01-17 NOTE — Progress Notes (Addendum)
Spoke w/ via phone for pre-op interview---Kristen [name] needs dos----  UPT, ISTAT       [name] results------07/09/22 EKG in chart COVID test -----patient states asymptomatic no test needed Arrive at -------0715 on Tuesday, 01/21/23 NPO after MN NO Solid Food.  Clear liquids from MN until---0615 Med rec completed Medications to take morning of surgery -----Albuterol inhaler prn, Buspar, Colestid, Hydroxyzine, Duoneb, Metoprolol, Propranolol prn, Omeprazole, Imodium prn Diabetic medication -----n/a Patient instructed no nail polish to be worn day of surgery Patient instructed to bring photo id and insurance card day of surgery Patient aware to have Driver (ride ) / caregiver    for 24 hours after surgery - mom, Kristen [name] Patient Special Instructions -----Bring rescue inhaler. Pre-Op special Instructions -----none Patient verbalized understanding of instructions that were given at this phone interview. Patient denies chest pain, sob, fever, cough at the interview.   Patient has a hx of asthma, obesity, orthostatic hypotension, HTN, Ehlers Danlos hypermobility. She follows w/ cardiology, [facility]. LOV 07/08/22 with Kristen Bridegroom, NP in chart.  07/13/20 Echocardiogram 08/17/21 Zio heart monitor 07/09/22 EKG  Patient has a BMI of 50.08. She is scheduled to come in on Monday, 01/20/23 @ 3:30 pm. Dr. Mal Amabile, MDA , front desk and pre-op made aware of appointment.

## 2023-01-20 ENCOUNTER — Other Ambulatory Visit: Payer: Self-pay | Admitting: Obstetrics & Gynecology

## 2023-01-20 ENCOUNTER — Ambulatory Visit: Payer: MEDICAID | Admitting: Physical Therapy

## 2023-01-20 DIAGNOSIS — N938 Other specified abnormal uterine and vaginal bleeding: Secondary | ICD-10-CM

## 2023-01-20 NOTE — Progress Notes (Signed)
Received voice mail message from patient today whom has anesthesia airway evaluation appointment today at 1530.  Returned phone call , spoke with patient ,  she stated she had another appointment with physical therapy that was really close the this one but she rescheduled the PT appointment so could and will make the anesthesia appointment for 1530 today.

## 2023-01-20 NOTE — Progress Notes (Signed)
Pt presents for anesthesia airway evaluation prior to procedure 01/21/23. Height/weight and vital signs obtained and documented in flowsheets. K Hollis MDA notified of pt arrival.

## 2023-01-21 ENCOUNTER — Ambulatory Visit (HOSPITAL_BASED_OUTPATIENT_CLINIC_OR_DEPARTMENT_OTHER): Payer: MEDICAID | Admitting: Anesthesiology

## 2023-01-21 ENCOUNTER — Ambulatory Visit (HOSPITAL_BASED_OUTPATIENT_CLINIC_OR_DEPARTMENT_OTHER)
Admission: RE | Admit: 2023-01-21 | Discharge: 2023-01-21 | Disposition: A | Discharge status: Home / Self Care | Payer: MEDICAID | Source: Ambulatory Visit | Attending: Obstetrics & Gynecology | Admitting: Obstetrics & Gynecology

## 2023-01-21 ENCOUNTER — Other Ambulatory Visit: Payer: Self-pay

## 2023-01-21 ENCOUNTER — Encounter (HOSPITAL_BASED_OUTPATIENT_CLINIC_OR_DEPARTMENT_OTHER): Payer: Self-pay | Admitting: Obstetrics & Gynecology

## 2023-01-21 ENCOUNTER — Encounter (HOSPITAL_BASED_OUTPATIENT_CLINIC_OR_DEPARTMENT_OTHER)
Admission: RE | Disposition: A | Discharge status: Home / Self Care | Payer: Self-pay | Source: Ambulatory Visit | Attending: Obstetrics & Gynecology

## 2023-01-21 DIAGNOSIS — N939 Abnormal uterine and vaginal bleeding, unspecified: Secondary | ICD-10-CM | POA: Insufficient documentation

## 2023-01-21 DIAGNOSIS — K219 Gastro-esophageal reflux disease without esophagitis: Secondary | ICD-10-CM | POA: Insufficient documentation

## 2023-01-21 DIAGNOSIS — I10 Essential (primary) hypertension: Secondary | ICD-10-CM | POA: Insufficient documentation

## 2023-01-21 DIAGNOSIS — Z791 Long term (current) use of non-steroidal anti-inflammatories (NSAID): Secondary | ICD-10-CM | POA: Diagnosis not present

## 2023-01-21 DIAGNOSIS — Z30430 Encounter for insertion of intrauterine contraceptive device: Secondary | ICD-10-CM | POA: Diagnosis not present

## 2023-01-21 DIAGNOSIS — Z6841 Body Mass Index (BMI) 40.0 and over, adult: Secondary | ICD-10-CM | POA: Diagnosis not present

## 2023-01-21 DIAGNOSIS — N938 Other specified abnormal uterine and vaginal bleeding: Secondary | ICD-10-CM

## 2023-01-21 DIAGNOSIS — F32A Depression, unspecified: Secondary | ICD-10-CM | POA: Insufficient documentation

## 2023-01-21 DIAGNOSIS — K449 Diaphragmatic hernia without obstruction or gangrene: Secondary | ICD-10-CM | POA: Insufficient documentation

## 2023-01-21 DIAGNOSIS — F419 Anxiety disorder, unspecified: Secondary | ICD-10-CM | POA: Insufficient documentation

## 2023-01-21 DIAGNOSIS — Z793 Long term (current) use of hormonal contraceptives: Secondary | ICD-10-CM | POA: Diagnosis not present

## 2023-01-21 DIAGNOSIS — Z79899 Other long term (current) drug therapy: Secondary | ICD-10-CM | POA: Diagnosis not present

## 2023-01-21 DIAGNOSIS — G4730 Sleep apnea, unspecified: Secondary | ICD-10-CM | POA: Insufficient documentation

## 2023-01-21 DIAGNOSIS — Z01818 Encounter for other preprocedural examination: Secondary | ICD-10-CM

## 2023-01-21 DIAGNOSIS — J45909 Unspecified asthma, uncomplicated: Secondary | ICD-10-CM | POA: Insufficient documentation

## 2023-01-21 HISTORY — DX: Orthostatic hypotension: I95.1

## 2023-01-21 HISTORY — DX: Noninfective gastroenteritis and colitis, unspecified: K52.9

## 2023-01-21 HISTORY — DX: Ehlers-Danlos syndrome, unspecified: Q79.60

## 2023-01-21 HISTORY — DX: Social phobia, unspecified: F40.10

## 2023-01-21 HISTORY — DX: Hypopituitarism: E23.0

## 2023-01-21 HISTORY — DX: Personal history of other benign neoplasm: Z86.018

## 2023-01-21 HISTORY — PX: INTRAUTERINE DEVICE (IUD) INSERTION: SHX5877

## 2023-01-21 HISTORY — DX: Insulin resistance, unspecified: E88.819

## 2023-01-21 HISTORY — DX: Other specified abnormal uterine and vaginal bleeding: N93.8

## 2023-01-21 HISTORY — DX: Post-traumatic stress disorder, unspecified: F43.10

## 2023-01-21 LAB — CBC
HCT: 42.6 % (ref 36.0–46.0)
Hemoglobin: 13.9 g/dL (ref 12.0–15.0)
MCH: 28.8 pg (ref 26.0–34.0)
MCHC: 32.6 g/dL (ref 30.0–36.0)
MCV: 88.4 fL (ref 80.0–100.0)
Platelets: 262 10*3/uL (ref 150–400)
RBC: 4.82 MIL/uL (ref 3.87–5.11)
RDW: 13.2 % (ref 11.5–15.5)
WBC: 9.8 10*3/uL (ref 4.0–10.5)
nRBC: 0 % (ref 0.0–0.2)

## 2023-01-21 LAB — POCT I-STAT, CHEM 8
BUN: 12 mg/dL (ref 6–20)
Calcium, Ion: 1.21 mmol/L (ref 1.15–1.40)
Chloride: 103 mmol/L (ref 98–111)
Creatinine, Ser: 0.9 mg/dL (ref 0.44–1.00)
Glucose, Bld: 95 mg/dL (ref 70–99)
HCT: 38 % (ref 36.0–46.0)
Hemoglobin: 12.9 g/dL (ref 12.0–15.0)
Potassium: 3.5 mmol/L (ref 3.5–5.1)
Sodium: 140 mmol/L (ref 135–145)
TCO2: 25 mmol/L (ref 22–32)

## 2023-01-21 LAB — POCT PREGNANCY, URINE: Preg Test, Ur: NEGATIVE

## 2023-01-21 SURGERY — EXAM UNDER ANESTHESIA
Anesthesia: General | Site: Vagina

## 2023-01-21 MED ORDER — LACTATED RINGERS IV SOLN: INTRAVENOUS | Status: DC

## 2023-01-21 MED ORDER — MIDAZOLAM HCL 2 MG/2ML IJ SOLN
INTRAMUSCULAR | Status: AC
  Filled 2023-01-21: qty 2

## 2023-01-21 MED ORDER — LEVONORGESTREL 20 MCG/DAY IU IUD
INTRAUTERINE_SYSTEM | INTRAUTERINE | Status: AC
  Filled 2023-01-21: qty 1

## 2023-01-21 MED ORDER — STERILE WATER FOR IRRIGATION IR SOLN
Status: DC | PRN
  Administered 2023-01-21: 500 mL

## 2023-01-21 MED ORDER — ONDANSETRON HCL 4 MG/2ML IJ SOLN
INTRAMUSCULAR | Status: AC
  Filled 2023-01-21: qty 2

## 2023-01-21 MED ORDER — MIDAZOLAM HCL 5 MG/5ML IJ SOLN
INTRAMUSCULAR | Status: DC | PRN
  Administered 2023-01-21: 2 mg via INTRAVENOUS

## 2023-01-21 MED ORDER — ONDANSETRON HCL 4 MG/2ML IJ SOLN
INTRAMUSCULAR | Status: DC | PRN
  Administered 2023-01-21: 4 mg via INTRAVENOUS

## 2023-01-21 MED ORDER — EPHEDRINE SULFATE (PRESSORS) 50 MG/ML IJ SOLN
INTRAMUSCULAR | Status: DC | PRN
  Administered 2023-01-21 (×2): 10 mg via INTRAVENOUS

## 2023-01-21 MED ORDER — DEXAMETHASONE SODIUM PHOSPHATE 10 MG/ML IJ SOLN
INTRAMUSCULAR | Status: AC
  Filled 2023-01-21: qty 1

## 2023-01-21 MED ORDER — LEVONORGESTREL 20 MCG/DAY IU IUD
1.0000 | INTRAUTERINE_SYSTEM | Freq: Once | INTRAUTERINE | Status: AC
  Administered 2023-01-21: 1 via INTRAUTERINE

## 2023-01-21 MED ORDER — EPHEDRINE 5 MG/ML INJ
INTRAVENOUS | Status: AC
  Filled 2023-01-21: qty 5

## 2023-01-21 MED ORDER — CHLOROPROCAINE HCL 1 % IJ SOLN
INTRAMUSCULAR | Status: DC | PRN
  Administered 2023-01-21: 10 mL

## 2023-01-21 MED ORDER — KETOROLAC TROMETHAMINE 30 MG/ML IJ SOLN
INTRAMUSCULAR | Status: DC | PRN
  Administered 2023-01-21: 30 mg via INTRAVENOUS

## 2023-01-21 MED ORDER — GLYCOPYRROLATE 0.2 MG/ML IJ SOLN
INTRAMUSCULAR | Status: DC | PRN
  Administered 2023-01-21: .2 mg via INTRAVENOUS

## 2023-01-21 MED ORDER — LIDOCAINE HCL (PF) 2 % IJ SOLN
INTRAMUSCULAR | Status: AC
  Filled 2023-01-21: qty 5

## 2023-01-21 MED ORDER — DEXAMETHASONE SODIUM PHOSPHATE 4 MG/ML IJ SOLN
INTRAMUSCULAR | Status: DC | PRN
  Administered 2023-01-21: 5 mg via INTRAVENOUS

## 2023-01-21 MED ORDER — DEXTROSE-SODIUM CHLORIDE 5-0.45 % IV SOLN: INTRAVENOUS | Status: DC

## 2023-01-21 MED ORDER — FENTANYL CITRATE (PF) 100 MCG/2ML IJ SOLN
INTRAMUSCULAR | Status: DC | PRN
  Administered 2023-01-21 (×2): 50 ug via INTRAVENOUS

## 2023-01-21 MED ORDER — FENTANYL CITRATE (PF) 100 MCG/2ML IJ SOLN
INTRAMUSCULAR | Status: AC
  Filled 2023-01-21: qty 2

## 2023-01-21 MED ORDER — PROPOFOL 10 MG/ML IV BOLUS
INTRAVENOUS | Status: DC | PRN
  Administered 2023-01-21: 200 mg via INTRAVENOUS

## 2023-01-21 MED ORDER — PROPOFOL 10 MG/ML IV BOLUS
INTRAVENOUS | Status: AC
  Filled 2023-01-21: qty 20

## 2023-01-21 MED ORDER — LIDOCAINE HCL (CARDIAC) PF 100 MG/5ML IV SOSY
PREFILLED_SYRINGE | INTRAVENOUS | Status: DC | PRN
  Administered 2023-01-21: 60 mg via INTRAVENOUS

## 2023-01-21 SURGICAL SUPPLY — 16 items
CATH ROBINSON RED A/P 16FR (CATHETERS) ×3 IMPLANT
COVER BACK TABLE 60X90IN (DRAPES) ×3 IMPLANT
DRAPE SHEET LG 3/4 BI-LAMINATE (DRAPES) ×3 IMPLANT
GLOVE SURG SS PI 7.0 STRL IVOR (GLOVE) ×3 IMPLANT
GOWN STRL REUS W/TWL LRG LVL3 (GOWN DISPOSABLE) ×3 IMPLANT
KIT TURNOVER CYSTO (KITS) ×3 IMPLANT
LEGGING LITHOTOMY PAIR STRL (DRAPES) ×3 IMPLANT
MIRENA IUD IMPLANT
NDL SPNL 22GX3.5 QUINCKE BK (NEEDLE) IMPLANT
NEEDLE SPNL 22GX3.5 QUINCKE BK (NEEDLE) ×2 IMPLANT
PACK BASIN DAY SURGERY FS (CUSTOM PROCEDURE TRAY) ×3 IMPLANT
PAD OB MATERNITY 4.3X12.25 (PERSONAL CARE ITEMS) ×3 IMPLANT
PAD PREP 24X48 CUFFED NSTRL (MISCELLANEOUS) ×3 IMPLANT
SYR CONTROL 10ML LL (SYRINGE) IMPLANT
TRAY DSU PREP LF (CUSTOM PROCEDURE TRAY) ×3 IMPLANT
WATER STERILE IRR 500ML POUR (IV SOLUTION) ×3 IMPLANT

## 2023-01-21 NOTE — Anesthesia Preprocedure Evaluation (Signed)
Anesthesia Evaluation  Patient identified by MRN, date of birth, ID band Patient awake    Reviewed: Allergy & Precautions, H&P , NPO status , Patient's Chart, lab work & pertinent test results  Airway Mallampati: II   Neck ROM: full    Dental   Pulmonary asthma , sleep apnea    breath sounds clear to auscultation       Cardiovascular hypertension,  Rhythm:regular Rate:Normal     Neuro/Psych  Headaches PSYCHIATRIC DISORDERS Anxiety Depression       GI/Hepatic hiatal hernia,GERD  ,,  Endo/Other    Class 3 obesity  Renal/GU      Musculoskeletal   Abdominal   Peds  Hematology   Anesthesia Other Findings   Reproductive/Obstetrics                             Anesthesia Physical Anesthesia Plan  ASA: 2  Anesthesia Plan: General   Post-op Pain Management:    Induction: Intravenous  PONV Risk Score and Plan: 3 and Ondansetron, Dexamethasone, Midazolam and Treatment may vary due to age or medical condition  Airway Management Planned: LMA  Additional Equipment:   Intra-op Plan:   Post-operative Plan: Extubation in OR  Informed Consent: I have reviewed the patients History and Physical, chart, labs and discussed the procedure including the risks, benefits and alternatives for the proposed anesthesia with the patient or authorized representative who has indicated his/her understanding and acceptance.     Dental advisory given  Plan Discussed with: CRNA, Anesthesiologist and Surgeon  Anesthesia Plan Comments:        Anesthesia Quick Evaluation

## 2023-01-21 NOTE — Discharge Instructions (Addendum)
     Post Anesthesia Home Care Instructions  Activity: Get plenty of rest for the remainder of the day. A responsible individual must stay with you for 24 hours following the procedure.  For the next 24 hours, DO NOT: -Drive a car -Advertising copywriter -Drink alcoholic beverages -Take any medication unless instructed by your physician -Make any legal decisions or sign important papers.  Meals: Start with liquid foods such as gelatin or soup. Progress to regular foods as tolerated. Avoid greasy, spicy, heavy foods. If nausea and/or vomiting occur, drink only clear liquids until the nausea and/or vomiting subsides. Call your physician if vomiting continues.  Special Instructions/Symptoms: Your throat may feel dry or sore from the anesthesia or the breathing tube placed in your throat during surgery. If this causes discomfort, gargle with warm salt water. The discomfort should disappear within 24 hours.     If needed, do not take any nonsteroidal anti inflammatories until after 4:35 pm.

## 2023-01-21 NOTE — Op Note (Signed)
PREOPERATIVE DIAGNOSIS:  Abnormal uterine bleeding POSTOPERATIVE DIAGNOSIS: The same PROCEDURE: EUA, insertion of Mirena IUD. SURGEON:  Dr. Scheryl Darter   INDICATIONS: 37 y.o. G0P0  here for scheduled surgery for the aforementioned diagnoses.   Risks of surgery were discussed with the patient including but not limited to: bleeding which may require transfusion; infection which may require antibiotics; injury to uterus or surrounding organs; intrauterine scarring which may impair future fertility; need for additional procedures including laparotomy or laparoscopy; and other postoperative/anesthesia complications. Written informed consent was obtained.    FINDINGS:  A 6 week size uterus.   ANESTHESIA:   General, paracervical block with 10 ml of 1% nesacaine  INTRAVENOUS FLUIDS:  250 ml of LR ESTIMATED BLOOD LOSS:  5 ml SPECIMENS: none COMPLICATIONS:  None immediate.  PROCEDURE DETAILS:  The patient was taken to the operating room where general anesthesia was administered and was found to be adequate.  After an adequate timeout was performed, she was placed in the dorsal lithotomy position and examined; then prepped and draped in the sterile manner.   Her bladder was catheterized for an unmeasured amount of clear, yellow urine. A speculum was then placed in the patient's vagina and a single tooth tenaculum was applied to the anterior lip of the cervix.   A paracervical block using 30 ml of 0.5% Marcaine was administered.  The cervix was sounded to 9 cm and dilated manually with metal dilators to accommodate the IUD insertion device. The Mirena device was inserted according to manufacturer's instructions and the string was trimmed to 3 cm.The tenaculum was removed from the anterior lip of the cervix and the vaginal speculum was removed after noting good hemostasis.  The patient tolerated the procedure well and was taken to the recovery area awake, extubated and in stable condition.  The patient will be  discharged to home as per PACU criteria.  Routine postoperative instructions given.  She was prescribed Percocet, Ibuprofen and Colace.  She will follow up in the clinic in 1 month for IUD string check.  Adam Phenix, MD 01/21/2023 10:49 AM

## 2023-01-21 NOTE — H&P (Signed)
Kristen [name] is an 37 y.o. female. Patient's last menstrual period was 01/10/2023 (approximate). She requests LNGIUD placement under anesthesia.  G0P0  Pertinent Gynecological History: Menses:  irregular Bleeding: dysfunctional uterine bleeding   Menstrual History:  Patient's last menstrual period was 01/10/2023 (approximate).    Past Medical History:  Diagnosis Date   Anxiety 2013   treated at [facility] by Deatra Robinson    Asthma 1989   no hospitalization, or intubation, previously on advair and singulair. asthma worsening.  Follows w/ Dr. Jonah Blue @ [facility]. Per pt on 01/17/23, she only uses inhaler if she is doing strenous exercise or during the change of seasons.   Chronic diarrhea    Follows w/ [facility].   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   Dysfunctional uterine bleeding    Dysrhythmia 2010   PVC's   Ehlers-Danlos syndrome    Follows with Dr. Burnard Leigh, Sports Medicine and PCP.   Essential hypertension 03/12/2019   Follows w/ [facility] cardiology.   GERD (gastroesophageal reflux disease)    History of hiatal hernia    History of suicidal ideation    IBS (irritable bowel syndrome)    Insulin resistance    Follows w/ endocrinology.   Kyphosis of thoracic region 2004   painful, runs in dad side    Lymphocytic hypophysitis (HCC)    Follows w/ endocrinology.   Migraine    Follows w/ PCP.   Orthostatic hypotension    Follows w/ [facility], Eligha Bridegroom, NP.   Pancreatitis due to biliary obstruction 2003   PCOS (polycystic ovarian syndrome) 2014   facial hair, irregular periods, never been pregnant    PTSD (post-traumatic stress disorder)    Follows w/ Behavioral Health.   Sleep apnea    no longer uses cpap   Social phobia    Follows w/ Behavioral Health.    Past Surgical History:  Procedure Laterality Date   BIOPSY   06/11/2021   Procedure: BIOPSY;  Surgeon: Napoleon Form, MD;  Location: [facility];  Service: Gastroenterology;;   BLADDER SURGERY     bladder stretched as a child   CHOLECYSTECTOMY  2003   COLONOSCOPY WITH PROPOFOL N/A 06/11/2021   Procedure: COLONOSCOPY WITH PROPOFOL;  Surgeon: Napoleon Form, MD;  Location: [facility];  Service: Gastroenterology;  Laterality: N/A;   DILATATION & CURETTAGE/HYSTEROSCOPY WITH MYOSURE N/A 12/14/2019   Procedure: DILATATION & CURETTAGE/HYSTEROSCOPY WITH Polypectomy with MYOSURE;  Surgeon: Tereso Newcomer, MD;  Location: [facility] OR;  Service: Gynecology;  Laterality: N/A;   MOHS SURGERY Left 11/2022   Right side and left leg   OPEN REDUCTION INTERNAL FIXATION (ORIF) PROXIMAL PHALANX Right 07/23/2013   Procedure: OPEN TREATMENT RIGHT RING FINGER, PROXIMAL INTERPHALANGEAL/JOINT FRACTURE DISLOCATION;  Surgeon: Jodi Marble, MD;  Location: [facility];  Service: Orthopedics;  Laterality: Right;   TONSILLECTOMY     and adenoidectomy    Family History  Problem Relation Age of Onset   Hypertension Mother    Lovis More-Chiari malformation Mother    Restless legs syndrome Mother    Osteoporosis Mother    COPD Mother    Asthma Mother    Squamous cell carcinoma Mother        skin    Eczema Mother    Arthritis Mother    Peripheral Artery Disease Mother  Anxiety disorder Mother    OCD Mother    Colonic polyp Mother    Stroke Mother    Hypertension Father    Scoliosis Father    Bipolar disorder Father    Congestive Heart Failure Father    COPD Father    Depression Father    Diabetes Maternal Grandmother    Hypertension Maternal Grandmother    Dementia Maternal Grandmother    OCD Maternal Grandmother    Alzheimer's disease Maternal Grandmother    Bone cancer Maternal Grandfather 45       bone marrow    Multiple myeloma Maternal Grandfather    Alcohol abuse Maternal Grandfather    Drug abuse Maternal Grandfather    Cancer  Paternal Grandmother        colon   Diabetes Paternal Grandfather    Alcohol abuse Paternal Grandfather    Drug abuse Paternal Grandfather    Dementia Paternal Grandfather    Alcohol abuse Maternal Aunt    Depression Maternal Aunt    Pancreatic cancer Maternal Aunt    Alcohol abuse Paternal Aunt    Depression Paternal Aunt    ADD / ADHD Cousin     Social History:  reports that she has never smoked. She has been exposed to tobacco smoke. She has never used smokeless tobacco. She reports that she does not drink alcohol and does not use drugs.  Allergies:  Allergies  Allergen Reactions   Latex Hives and Swelling   Mucinex [Guaifenesin Er] Hives, Itching and Swelling   Other Anaphylaxis and Nausea Only    Pickles - throat swelling and nausea    Penicillins Swelling    Did it involve swelling of the face/tongue/throat, SOB, or low BP? Yes Did it involve sudden or severe rash/hives, skin peeling, or any reaction on the inside of your mouth or nose? No Did you need to seek medical attention at a hospital or doctor's office? Yes When did it last happen?      >10 years ago If all above answers are "NO", may proceed with cephalosporin use.    Contrast Media [Iodinated Contrast Media] Nausea And Vomiting   Multihance [Gadobenate] Nausea And Vomiting    Medications Prior to Admission  Medication Sig Dispense Refill Last Dose/Taking   albuterol (VENTOLIN HFA) 108 (90 Base) MCG/ACT inhaler Inhale 2 puffs into the lungs every 6 (six) hours as needed for wheezing or shortness of breath. 18 g 1    Benzocaine-Resorcinol (VAGISIL EX) Apply 1 application. topically as needed.      busPIRone (BUSPAR) 10 MG tablet Take 1 tablet (10 mg total) by mouth 3 (three) times daily. 90 tablet 3    colestipol (COLESTID) 1 g tablet Take 2 tablets (2 g total) by mouth 2 (two) times daily. 120 tablet 0    diclofenac Sodium (VOLTAREN) 1 % GEL Apply 2 g topically 4 (four) times daily. (Patient taking differently:  Apply 2 g topically as needed.) 100 g 0    gabapentin (NEURONTIN) 300 MG capsule TAKE 1 CAPSULE (300 MG TOTAL) BY MOUTH AT BEDTIME. (Patient taking differently: Take 300 mg by mouth as needed (pain).) 30 capsule 3    hydrOXYzine (ATARAX) 25 MG tablet Take 1 tablet (25 mg total) by mouth 4 (four) times daily. 120 tablet 3    ipratropium-albuterol (DUONEB) 0.5-2.5 (3) MG/3ML SOLN Take 3 mLs by nebulization every 4 (four) hours as needed.      loperamide (IMODIUM A-D) 2 MG tablet Take 2 mg by mouth  4 (four) times daily as needed for diarrhea or loose stools.      medroxyPROGESTERone (PROVERA) 5 MG tablet Take 2 tablets (10 mg total) by mouth daily. For 15 days for amenorrhea 30 tablet 1    metoprolol tartrate (LOPRESSOR) 25 MG tablet Take 1 tablet (25 mg total) by mouth 2 (two) times daily. 60 tablet 4    naproxen (NAPROSYN) 375 MG tablet Take 1 tablet (375 mg total) by mouth 2 (two) times daily with a meal. (Patient taking differently: Take 375 mg by mouth as needed.) 20 tablet 0    omeprazole (PRILOSEC) 40 MG capsule Take 1 capsule (40 mg total) by mouth daily before breakfast. 90 capsule 0    propranolol (INDERAL) 10 MG tablet Take 2 tablets (20 mg total) by mouth 3 (three) times daily as needed (palpitations). 60 tablet 11    QUEtiapine (SEROQUEL) 100 MG tablet Take 1 tablet (100 mg total) by mouth at bedtime. 30 tablet 3    sertraline (ZOLOFT) 100 MG tablet Take 2 tablets (200 mg total) by mouth at bedtime. 60 tablet 3    traZODone (DESYREL) 100 MG tablet Take 1 tablet (100 mg total) by mouth at bedtime as needed for sleep. 30 tablet 3     Review of Systems  Constitutional: Negative.   Respiratory: Negative.    Cardiovascular: Negative.   Gastrointestinal: Negative.   Genitourinary:  Positive for vaginal bleeding. Negative for pelvic pain and vaginal discharge.    Blood pressure (!) 165/108, pulse 61, temperature (!) 97.5 F (36.4 C), resp. rate 18, height 5' 6.5" (1.689 m), weight (!)  139.2 kg, last menstrual period 01/10/2023, SpO2 100%. Physical Exam Vitals and nursing note reviewed. Exam conducted with a chaperone present.  Constitutional:      Appearance: She is obese. She is not ill-appearing.  HENT:     Head: Normocephalic and atraumatic.  Cardiovascular:     Rate and Rhythm: Normal rate.  Pulmonary:     Effort: Pulmonary effort is normal.  Musculoskeletal:     Cervical back: Normal range of motion.  Neurological:     Mental Status: She is alert.  Psychiatric:        Mood and Affect: Mood normal.        Behavior: Behavior normal.     Results for orders placed or performed during the hospital encounter of 01/21/23 (from the past 24 hours)  Pregnancy, urine POC     Status: None   Collection Time: 01/21/23  7:10 AM  Result Value Ref Range   Preg Test, Ur NEGATIVE NEGATIVE    No results found.  Assessment/Plan: Requests IUD under anesthesia, has had this before.The risks of surgery were discussed in detail with the patient including but not limited to: bleeding which may require transfusion or reoperation; infection which may require prolonged hospitalization or re-hospitalization and antibiotic therapy; injury to bowel, bladder, ureters and major vessels or other surrounding organs which may lead to other procedures; formation of adhesions; need for additional procedures including laparotomy or subsequent procedures secondary to intraoperative injury or abnormal pathology; thromboembolic phenomenon; incisional problems and other postoperative or anesthesia complications.  Patient was told that the likelihood that her condition and symptoms will be treated effectively with this surgical management was very high; the postoperative expectations were also discussed in detail. The patient also understands the alternative treatment options which were discussed in full. All questions were answered.  Scheryl Darter 01/21/2023, 7:24 AM

## 2023-01-21 NOTE — Transfer of Care (Signed)
Immediate Anesthesia Transfer of Care Note  Patient: Kristen [name]  Procedure(s) Performed: Procedure(s) (LRB): EXAM UNDER ANESTHESIA (N/A) INTRAUTERINE DEVICE (IUD) INSERTION(MIRENA) (N/A)  Patient Location: PACU  Anesthesia Type: GA  Level of Consciousness: awake, sedated, patient cooperative and responds to stimulation  Airway & Oxygen Therapy: Patient Spontanous Breathing and Patient connected to [place] oxygen  Post-op Assessment: Report given to PACU RN, Post -op Vital signs reviewed and stable and Patient moving all extremities  Post vital signs: Reviewed and stable  Complications: No apparent anesthesia complications

## 2023-01-21 NOTE — Anesthesia Procedure Notes (Signed)
Procedure Name: LMA Insertion Date/Time: 01/21/2023 10:17 AM  Performed by: Jessica Priest, CRNAPre-anesthesia Checklist: Patient identified, Emergency Drugs available, Suction available, Patient being monitored and Timeout performed Patient Re-evaluated:Patient Re-evaluated prior to induction Oxygen Delivery Method: Circle system utilized Preoxygenation: Pre-oxygenation with 100% oxygen Induction Type: IV induction Ventilation: Mask ventilation without difficulty LMA: LMA inserted LMA Size: 4.0 Number of attempts: 1 Airway Equipment and Method: Bite block Placement Confirmation: positive ETCO2, breath sounds checked- equal and bilateral and CO2 detector Tube secured with: Tape Dental Injury: Teeth and Oropharynx as per pre-operative assessment

## 2023-01-22 NOTE — Anesthesia Postprocedure Evaluation (Signed)
Anesthesia Post Note  Patient: [name]  Procedure(s) Performed: Francia Greaves UNDER ANESTHESIA (Vagina ) INTRAUTERINE DEVICE (IUD) INSERTION(MIRENA) (Uterus)     Patient location during evaluation: PACU Anesthesia Type: General Level of consciousness: awake and alert Pain management: pain level controlled Vital Signs Assessment: post-procedure vital signs reviewed and stable Respiratory status: spontaneous breathing, nonlabored ventilation, respiratory function stable and patient connected to nasal cannula oxygen Cardiovascular status: blood pressure returned to baseline and stable Postop Assessment: no apparent nausea or vomiting Anesthetic complications: no   No notable events documented.  Last Vitals:  Vitals:   01/21/23 1118 01/21/23 1154  BP:  118/74  Pulse: (!) 49 (!) 44  Resp: 13 12  Temp: (!) 36.3 C (!) 36.3 C  SpO2: 97% 98%    Last Pain:  Vitals:   01/21/23 1154  TempSrc:   PainSc: 0-No pain                 Wessie Shanks S

## 2023-01-23 ENCOUNTER — Encounter (HOSPITAL_BASED_OUTPATIENT_CLINIC_OR_DEPARTMENT_OTHER): Payer: Self-pay | Admitting: Obstetrics & Gynecology

## 2023-02-06 ENCOUNTER — Telehealth: Payer: Self-pay | Admitting: *Deleted

## 2023-02-06 NOTE — Telephone Encounter (Signed)
 LMVM for pt to bring machine cpap/cord) for appt on Monday.

## 2023-02-09 ENCOUNTER — Encounter (HOSPITAL_COMMUNITY): Payer: Self-pay | Admitting: Emergency Medicine

## 2023-02-09 ENCOUNTER — Emergency Department (HOSPITAL_COMMUNITY): Payer: MEDICAID

## 2023-02-09 ENCOUNTER — Emergency Department (HOSPITAL_COMMUNITY)
Admission: EM | Admit: 2023-02-09 | Discharge: 2023-02-09 | Disposition: A | Discharge status: Home / Self Care | Payer: MEDICAID | Attending: Emergency Medicine | Admitting: Emergency Medicine

## 2023-02-09 DIAGNOSIS — N938 Other specified abnormal uterine and vaginal bleeding: Secondary | ICD-10-CM | POA: Insufficient documentation

## 2023-02-09 DIAGNOSIS — N939 Abnormal uterine and vaginal bleeding, unspecified: Secondary | ICD-10-CM | POA: Diagnosis present

## 2023-02-09 DIAGNOSIS — Z91040 Latex allergy status: Secondary | ICD-10-CM | POA: Insufficient documentation

## 2023-02-09 LAB — CBC WITH DIFFERENTIAL/PLATELET
Abs Immature Granulocytes: 0.02 10*3/uL (ref 0.00–0.07)
Basophils Absolute: 0 10*3/uL (ref 0.0–0.1)
Basophils Relative: 1 %
Eosinophils Absolute: 0.4 10*3/uL (ref 0.0–0.5)
Eosinophils Relative: 6 %
HCT: 36.7 % (ref 36.0–46.0)
Hemoglobin: 12.1 g/dL (ref 12.0–15.0)
Immature Granulocytes: 0 %
Lymphocytes Relative: 33 %
Lymphs Abs: 2.6 10*3/uL (ref 0.7–4.0)
MCH: 28.9 pg (ref 26.0–34.0)
MCHC: 33 g/dL (ref 30.0–36.0)
MCV: 87.8 fL (ref 80.0–100.0)
Monocytes Absolute: 0.4 10*3/uL (ref 0.1–1.0)
Monocytes Relative: 5 %
Neutro Abs: 4.3 10*3/uL (ref 1.7–7.7)
Neutrophils Relative %: 55 %
Platelets: 212 10*3/uL (ref 150–400)
RBC: 4.18 MIL/uL (ref 3.87–5.11)
RDW: 13.5 % (ref 11.5–15.5)
WBC: 7.7 10*3/uL (ref 4.0–10.5)
nRBC: 0 % (ref 0.0–0.2)

## 2023-02-09 LAB — COMPREHENSIVE METABOLIC PANEL
ALT: 29 U/L (ref 0–44)
AST: 26 U/L (ref 15–41)
Albumin: 3.5 g/dL (ref 3.5–5.0)
Alkaline Phosphatase: 116 U/L (ref 38–126)
Anion gap: 9 (ref 5–15)
BUN: 11 mg/dL (ref 6–20)
CO2: 22 mmol/L (ref 22–32)
Calcium: 8.7 mg/dL — ABNORMAL LOW (ref 8.9–10.3)
Chloride: 107 mmol/L (ref 98–111)
Creatinine, Ser: 0.9 mg/dL (ref 0.44–1.00)
GFR, Estimated: >60 mL/min (ref >60)
Glucose, Bld: 91 mg/dL (ref 70–99)
Potassium: 3.6 mmol/L (ref 3.5–5.1)
Sodium: 138 mmol/L (ref 135–145)
Total Bilirubin: 0.6 mg/dL (ref 0.0–1.2)
Total Protein: 6.6 g/dL (ref 6.5–8.1)

## 2023-02-09 LAB — HCG, SERUM, QUALITATIVE: Preg, Serum: NEGATIVE

## 2023-02-09 NOTE — ED Provider Notes (Signed)
 [place] EMERGENCY DEPARTMENT AT [facility] Provider Note   CSN: [id number] Arrival date & time: 02/09/23  1757     History  Chief Complaint  Patient presents with   Vaginal Bleeding    Kristen [name] is a 38 y.o. female.  Patient to ED with c/o heavy vaginal bleeding with clots for the past week. History of the same since age 54. She reports placement of IUD on 12/17 to treat heavy bleeding. She started bleeding one week prior to IUD placement, that stopped for 1-2 days and started again one week ago. She feels fatigued, lightheaded when standing. No syncope. She has pelvic cramping type pain but no significant increase in her pain. No history of transfusion.   The history is provided by the patient. No language interpreter was used.  Vaginal Bleeding      Home Medications Prior to Admission medications   Medication Sig Start Date End Date Taking? Authorizing Provider  albuterol  (VENTOLIN  HFA) 108 (90 Base) MCG/ACT inhaler Inhale 2 puffs into the lungs every 6 (six) hours as needed for wheezing or shortness of breath. 06/27/22   Danton, Jon HERO, PA-C  Benzocaine -Resorcinol (VAGISIL EX) Apply 1 application. topically as needed.    [provider]  busPIRone  (BUSPAR ) 10 MG tablet Take 1 tablet (10 mg total) by mouth 3 (three) times daily. 01/16/23   Harl Zane BRAVO, NP  colestipol  (COLESTID ) 1 g tablet Take 2 tablets (2 g total) by mouth 2 (two) times daily. 12/20/22   Craig Alan SAUNDERS, PA-C  diclofenac  Sodium (VOLTAREN ) 1 % GEL Apply 2 g topically 4 (four) times daily. Patient taking differently: Apply 2 g topically as needed. 06/21/21   Vicci Barnie NOVAK, MD  gabapentin  (NEURONTIN ) 300 MG capsule TAKE 1 CAPSULE (300 MG TOTAL) BY MOUTH AT BEDTIME. Patient taking differently: Take 300 mg by mouth as needed (pain). 03/24/20 01/21/23  Vicci Barnie NOVAK, MD  hydrOXYzine  (ATARAX ) 25 MG tablet Take 1 tablet (25 mg total) by mouth 4 (four) times daily.  01/16/23   Harl Zane E, NP  ipratropium-albuterol  (DUONEB) 0.5-2.5 (3) MG/3ML SOLN Take 3 mLs by nebulization every 4 (four) hours as needed.    [provider]  loperamide (IMODIUM A-D) 2 MG tablet Take 2 mg by mouth 4 (four) times daily as needed for diarrhea or loose stools.    [provider]  medroxyPROGESTERone  (PROVERA ) 5 MG tablet Take 2 tablets (10 mg total) by mouth daily. For 15 days for amenorrhea 10/01/22   Eveline Lynwood MATSU, MD  metoprolol  tartrate (LOPRESSOR ) 25 MG tablet Take 1 tablet (25 mg total) by mouth 2 (two) times daily. 12/30/22   Vicci Barnie NOVAK, MD  naproxen  (NAPROSYN ) 375 MG tablet Take 1 tablet (375 mg total) by mouth 2 (two) times daily with a meal. Patient taking differently: Take 375 mg by mouth as needed. 06/15/21   Palumbo, April, MD  omeprazole  (PRILOSEC) 40 MG capsule Take 1 capsule (40 mg total) by mouth daily before breakfast. 12/20/22   Craig Alan SAUNDERS, PA-C  propranolol  (INDERAL ) 10 MG tablet Take 2 tablets (20 mg total) by mouth 3 (three) times daily as needed (palpitations). 07/08/22   Swinyer, Rosaline HERO, NP  QUEtiapine  (SEROQUEL ) 100 MG tablet Take 1 tablet (100 mg total) by mouth at bedtime. 01/16/23   Harl Zane BRAVO, NP  sertraline  (ZOLOFT ) 100 MG tablet Take 2 tablets (200 mg total) by mouth at bedtime. 01/16/23   Harl Zane BRAVO, NP  traZODone  (DESYREL )  100 MG tablet Take 1 tablet (100 mg total) by mouth at bedtime as needed for sleep. 01/16/23   Harl Zane BRAVO, NP      Allergies    Latex, Mucinex [guaifenesin er], Other, Penicillins, Contrast media [iodinated contrast media], and Multihance  [gadobenate]    Review of Systems   Review of Systems  Genitourinary:  Positive for vaginal bleeding.    Physical Exam Updated Vital Signs BP (!) 135/97   Pulse 71   Temp 98.4 F (36.9 C)   Resp 17   Ht 5' 7 (1.702 m)   Wt (!) 139 kg   SpO2 99%   BMI 48.00 kg/m  Physical Exam Vitals and nursing note reviewed.   Constitutional:      General: She is not in acute distress.    Appearance: She is well-developed. She is not ill-appearing.  Eyes:     Comments: No conjunctival pallor.  Pulmonary:     Effort: Pulmonary effort is normal.  Abdominal:     General: There is no distension.     Palpations: Abdomen is soft.  Musculoskeletal:        General: Normal range of motion.     Cervical back: Normal range of motion.  Skin:    General: Skin is warm and dry.     Coloration: Skin is not pale.  Neurological:     Mental Status: She is alert and oriented to person, place, and time.     ED Results / Procedures / Treatments   Labs (all labs ordered are listed, but only abnormal results are displayed) Labs Reviewed  COMPREHENSIVE METABOLIC PANEL - Abnormal; Notable for the following components:      Result Value   Calcium 8.7 (*)    All other components within normal limits  CBC WITH DIFFERENTIAL/PLATELET  HCG, SERUM, QUALITATIVE   Results for orders placed or performed during the hospital encounter of 02/09/23  CBC with Differential   Collection Time: 02/09/23  6:57 PM  Result Value Ref Range   WBC 7.7 4.0 - 10.5 K/uL   RBC 4.18 3.87 - 5.11 MIL/uL   Hemoglobin 12.1 12.0 - 15.0 g/dL   HCT 63.2 63.9 - 53.9 %   MCV 87.8 80.0 - 100.0 fL   MCH 28.9 26.0 - 34.0 pg   MCHC 33.0 30.0 - 36.0 g/dL   RDW 86.4 88.4 - 84.4 %   Platelets 212 150 - 400 K/uL   nRBC 0.0 0.0 - 0.2 %   Neutrophils Relative % 55 %   Neutro Abs 4.3 1.7 - 7.7 K/uL   Lymphocytes Relative 33 %   Lymphs Abs 2.6 0.7 - 4.0 K/uL   Monocytes Relative 5 %   Monocytes Absolute 0.4 0.1 - 1.0 K/uL   Eosinophils Relative 6 %   Eosinophils Absolute 0.4 0.0 - 0.5 K/uL   Basophils Relative 1 %   Basophils Absolute 0.0 0.0 - 0.1 K/uL   Immature Granulocytes 0 %   Abs Immature Granulocytes 0.02 0.00 - 0.07 K/uL  hCG, serum, qualitative   Collection Time: 02/09/23  6:57 PM  Result Value Ref Range   Preg, Serum NEGATIVE NEGATIVE   Comprehensive metabolic panel   Collection Time: 02/09/23  6:57 PM  Result Value Ref Range   Sodium 138 135 - 145 mmol/L   Potassium 3.6 3.5 - 5.1 mmol/L   Chloride 107 98 - 111 mmol/L   CO2 22 22 - 32 mmol/L   Glucose, Bld 91 70 - 99 mg/dL  BUN 11 6 - 20 mg/dL   Creatinine, Ser 9.09 0.44 - 1.00 mg/dL   Calcium 8.7 (L) 8.9 - 10.3 mg/dL   Total Protein 6.6 6.5 - 8.1 g/dL   Albumin 3.5 3.5 - 5.0 g/dL   AST 26 15 - 41 U/L   ALT 29 0 - 44 U/L   Alkaline Phosphatase 116 38 - 126 U/L   Total Bilirubin 0.6 0.0 - 1.2 mg/dL   GFR, Estimated >39 >39 mL/min   Anion gap 9 5 - 15    EKG None  Radiology US  PELVIC COMPLETE W TRANSVAGINAL AND TORSION R/O Result Date: 02/09/2023 CLINICAL DATA:  History of IUD placement on 01/21/2023 with increased vaginal bleeding, initial encounter EXAM: TRANSABDOMINAL AND TRANSVAGINAL ULTRASOUND OF PELVIS DOPPLER ULTRASOUND OF OVARIES TECHNIQUE: Both transabdominal and transvaginal ultrasound examinations of the pelvis were performed. Transabdominal technique was performed for global imaging of the pelvis including uterus, ovaries, adnexal regions, and pelvic cul-de-sac. It was necessary to proceed with endovaginal exam following the transabdominal exam to visualize the ovaries. Color and duplex Doppler ultrasound was utilized to evaluate blood flow to the ovaries. COMPARISON:  None Available. FINDINGS: Uterus Measurements: 7.8 x 4.0 x 5.4 cm. = volume: 90 mL. No fibroids or other mass visualized. Endometrium Thickness: 9.8 mm. IUD is noted although low lying within the endometrium extending into the cervix. Right ovary Measurements: 3.0 x 1.7 x 2.2 cm. = volume: 5.9 mL. Normal appearance/no adnexal mass. Left ovary Measurements: 3.6 x 2.6 x 2.8 cm. = volume: 13.8 mL. 3.1 cm simple cyst is noted in the left ovary. Pulsed Doppler evaluation of both ovaries demonstrates normal low-resistance arterial and venous waveforms. Other findings No abnormal free fluid. IMPRESSION:  IUD is noted although low in the uterus extending into the cervix. Left ovarian benign functional cyst measuring 3.1 cm. No follow-up imaging is recommended. Reference: Radiology 2019 Nov;293(2):359-371 Electronically Signed   By: Oneil Devonshire M.D.   On: 02/09/2023 19:59    Procedures Procedures    Medications Ordered in ED Medications - No data to display  ED Course/ Medical Decision Making/ A&P Clinical Course as of 02/10/23 2127  Mon Feb 10, 2023  2125 Patient to ED with c/o heavy vaginal bleeding. History of the same. Passing clots. US  pending for review of placement of recently placed IUD.   Patient is hemodynamically stable, hgb 12.1. US  pending. Patient care signed out to Dr. MARLA. Horton pending results of US  and appropriate disposition.  [SU]    Clinical Course User Index [SU] Odell Balls, PA-C                                 Medical Decision Making Amount and/or Complexity of Data Reviewed Independent Historian: spouse    Details: Corroborates history Labs: ordered.    Details: No WBC; hgb 12.1, stable No electrolyte abnormality Preg negative Radiology: ordered.    Details: Pelvic US  for IUD placement Per US  tech there is a left ovarian cyst of >3cm requiring evaluation to r/o torsion. This order was added Discussion of management or test interpretation with external provider(s): Patient to ED with vaginal bleeding. VSS, no hypotension or tachycardia. No conjunctival pallor. Doubt significant anemia. Will review labs and check placement of IUD.  HGB 12.1. She is hemodynamically stable.            Final Clinical Impression(s) / ED Diagnoses Final diagnoses:  Dysfunctional uterine bleeding  Rx / DC Orders ED Discharge Orders     None         Odell Balls, PA-C 02/10/23 2127    Bari Roxie HERO, DO 02/13/23 1830

## 2023-02-09 NOTE — ED Provider Notes (Signed)
 Patient signed out to me by previous provider. Please refer to their note for full HPI.  Briefly this is a 38 year old female who presented to the emergency department with ongoing vaginal bleeding.  She is being followed as an outpatient with OB/GYN.  Recently had an IUD placed about 3 weeks ago in hopes of bleeding control.  She states that the bleeding has continued.  Vitals are normal and stable.  Hemoglobin is stable.  Ultrasound pending at time of signout.  Ultrasound shows that the IUD is in the uterus, however in the lower portion extending into the cervix.  Have instructed the patient to call her OB/GYN tomorrow morning for review of ultrasound results and further evaluation and treatment.  She understands and agrees.  Patient at this time appears safe and stable for discharge and close outpatient follow up. Discharge plan and strict return to ED precautions discussed, patient verbalizes understanding and agreement.   Bari Roxie HERO, DO 02/09/23 [id number]

## 2023-02-09 NOTE — ED Triage Notes (Signed)
 Pt had IUD placed 12/17 for heavy bleeding. Endorses worsening bleeding for last 2 weeks. States she is going through 2-3 overnight pads in an hour. Endorses 7/10 cramping. Pt passing lots of clots.

## 2023-02-09 NOTE — Discharge Instructions (Addendum)
 You have been seen and discharged from the emergency department.  Your blood work was normal.  The ultrasound showed that the IUD was in the lower part of the uterus closer to the cervix.  You need to call your OB/GYN tomorrow to review this positioning for further evaluation.  There is other medication/hormonal medication that can be used to help control uterine bleeding.  This can be an option reviewed by your OB/GYN.  Follow-up with your primary provider for further evaluation and further care. Take home medications as prescribed. If you have any worsening symptoms or further concerns for your health please return to an emergency department for further evaluation.

## 2023-02-09 NOTE — ED Notes (Signed)
 Patient d/c with home care instructions with visitor at bedside. IV discontinued.

## 2023-02-10 ENCOUNTER — Institutional Professional Consult (permissible substitution): Payer: MEDICAID | Admitting: Neurology

## 2023-02-10 ENCOUNTER — Encounter: Payer: Self-pay | Admitting: Neurology

## 2023-02-10 ENCOUNTER — Encounter: Payer: MEDICAID | Admitting: Physical Therapy

## 2023-02-10 ENCOUNTER — Encounter: Payer: Self-pay | Admitting: Obstetrics & Gynecology

## 2023-02-13 ENCOUNTER — Ambulatory Visit (INDEPENDENT_AMBULATORY_CARE_PROVIDER_SITE_OTHER): Payer: MEDICAID | Admitting: Clinical

## 2023-02-13 DIAGNOSIS — F4010 Social phobia, unspecified: Secondary | ICD-10-CM | POA: Diagnosis not present

## 2023-02-13 DIAGNOSIS — F331 Major depressive disorder, recurrent, moderate: Secondary | ICD-10-CM
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
## 2023-02-17 ENCOUNTER — Encounter (HOSPITAL_COMMUNITY): Payer: Self-pay | Admitting: Gastroenterology

## 2023-02-17 ENCOUNTER — Other Ambulatory Visit: Payer: Self-pay

## 2023-02-17 NOTE — Progress Notes (Addendum)
 PCP - Dr. Barnie Louder  Cardiologist - Powell Sorrow, MD   LOV 07-12-21 epic  PPM/ICD -  Device Orders -  Rep Notified -   Chest x-ray - 06-15-21 epic EKG - 01-23-23 epic Stress Test -  ECHO - 2022 epic Cardiac Cath -   Sleep Study -  CPAP -   Fasting Blood Sugar -  Checks Blood Sugar _____ times a day  Blood Thinner Instructions: Aspirin Instructions:    COVID vaccine -Yes  Activity--Able to complete Adl's without CP or SOB  Anesthesia review: Mertha Bullock, HTN, OSA, Asthma  Patient denies shortness of breath, fever, cough and chest pain at PAT appointment   All instructions explained to the patient, with a verbal understanding of the material. Patient agrees to go over the instructions while at home for a better understanding. Patient also instructed to self quarantine after being tested for COVID-19. The opportunity to ask questions was provided.

## 2023-02-20 ENCOUNTER — Encounter: Payer: Self-pay | Admitting: Neurology

## 2023-02-20 ENCOUNTER — Ambulatory Visit: Payer: MEDICAID | Admitting: Neurology

## 2023-02-20 VITALS — BP 169/93 | HR 77 | Ht 65.6 in | Wt 308.2 lb

## 2023-02-20 DIAGNOSIS — Z6841 Body Mass Index (BMI) 40.0 and over, adult: Secondary | ICD-10-CM

## 2023-02-20 DIAGNOSIS — G4733 Obstructive sleep apnea (adult) (pediatric): Secondary | ICD-10-CM

## 2023-02-20 DIAGNOSIS — G4719 Other hypersomnia: Secondary | ICD-10-CM

## 2023-02-20 DIAGNOSIS — R351 Nocturia: Secondary | ICD-10-CM

## 2023-02-20 DIAGNOSIS — R519 Headache, unspecified: Secondary | ICD-10-CM

## 2023-02-20 NOTE — Patient Instructions (Signed)
Based on your symptoms and your exam I believe you are still at risk for obstructive sleep apnea or OSA. Your home sleep test from 2018 showed overall mild sleep apnea. We will do a repeat test at home. After the test, we will reach out to you. I will likely recommend you start treatment with an autoPAP machine.   Please remember, the risks and ramifications of moderate to severe obstructive sleep apnea or OSA are: Cardiovascular disease, including congestive heart failure, stroke, difficult to control hypertension, arrhythmias, and even type 2 diabetes has been linked to untreated OSA. Sleep apnea causes disruption of sleep and sleep deprivation in most cases, which, in turn, can cause recurrent headaches, problems with memory, mood, concentration, focus, and vigilance. Most people with untreated sleep apnea report excessive daytime sleepiness, which can affect their ability to drive. Please do not drive if you feel sleepy.   We will do a home sleep test for your convenience. You will take equipment home for this and bring it back the next day for analysis.   I will plan to see you back after your sleep test. We will call you after your sleep study to advise about the results (most likely, you will hear from my nurse) and to set up an appointment at the time, as necessary.    Our sleep lab administrative assistant will call you to schedule your sleep equipment pick up so you or your family member can learn how to put the sensors on at night. If you don't hear back from her by next week please feel free to call her at [phone]. This is her direct line and please leave a message with your phone number to call back if you get the voicemail box. She will call back as soon as possible.

## 2023-02-20 NOTE — Progress Notes (Signed)
Subjective:    Patient ID: Kristen [name] is a 38 y.o. female.  HPI    Huston Foley, MD, PhD [facility] [address] [address] [place], [place] [address]  Dear Dr. Laural Benes,  I saw your patient, [name], upon your kind request in my sleep clinic today for evaluation of her sleep disordered breathing, in particular, re-evaluation of her prior diagnosis of obstructive sleep apnea.  The patient accompanied by her mom today. She missed an appointment on 02/10/2023. As you know, Ms. Waltman is a 38 year old female with an underlying medical history of hypertension, Ehlers-Danlos syndrome, Raynaud's syndrome, PCOS, IBS, reflux disease, migraine headaches (followed by Dr. Lucia Gaskins and Shawnie Dapper, NP in the past), asthma, anxiety, depression, and morbid obesity with a BMI of over 50, who reports snoring and excessive daytime somnolence.  Her Epworth sleepiness score is 11 out of 24, fatigue severity score is 47 out of 63.  I reviewed your office note from 12/30/2022.  I had evaluated her several years ago for sleep apnea.  She had a baseline sleep study on 12/05/2016 which showed an AHI of 5.2/hour and O2 nadir of 87%.  She started treatment on AutoPap at the time.  She has not been seen in our clinic in a few years. She is no longer on PAP therapy.  He stopped using her machine in or around 2021 or 2022.  She has a variable sleep schedule, no set bedtime or rise time.  She sees psychiatry and takes Seroquel 100 mg at bedtime and occasionally trazodone 100 mg at bedtime.  She drinks caffeine in the form of tea, about 1/day, she is a non-smoker and does not currently drink any alcohol.  She does not work, she is on disability.  She has nocturia about twice per average night and reports morning headaches.      Previously:  03/17/2019 Shawnie Dapper, NP): <<Kristen [name] is a 38 y.o. female here today for follow up for migraines. We started propranolol 10mg  twice  daily in 09/2018. She does wake up occasionally with headaches but most occur at night. She has at least three migrainous headaches weekly. Usually left sided retro orbital pressure. She is sensitive to light and sound, occasionally nauseated. She has noted "free floating colors" before headache start. She feels disoriented and has brain fog with migraine. She has seen PCP recently who was concerned of previous MRI results. 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 Repeat MRI in 10/2016 was stable.    She reports using CPAP on most nights. She usually gets 4 hours each night. She usually goes to bed around 11:30 and wakes around 6am. She always feels tired. She is working full time. She feels that if she is not at work she needs to nap. She has brain fog all the time.  Compliance report dated 02/14/2019 through 03/15/2019 reveals that she used CPAP 24 of the last 30 days for compliance of 80%.  She is CPAP greater than 4 hours 24 out of the last 30 days for compliance of 80%.  Average usage was 7 hours and 47 minutes.  Residual AHI was 0.8 on 4 to 10 cm of pressure and an EPR of 3.  There was no significant leak noted.   She is having irregular menstrual cycles. She was diagnosed with PCOS in 2018. She was started on metformin in 2018 and stopped when she lost insurance. She recently restarted on metformin 500mg  twice but had  nausea and diarrhea for about 1-2 months. She has not had a menstrual cycle since 12/2018. She was seen last week by OB/GYN and started on Femara/Provera to stimulate cycle. She is hoping to conceive. She was also given letrozole to induce ovulation.    She does admit that she has significant anxiety and depression. She was referred to psychiatry with [facility]. She has a history of suicidal attempts. When she was younger she had multiple attempts of suicide. Never hospitalized. No attempts since teenage years. She does still have thoughts of committing suicide.  She does  not have a plan.  She often thinks of driving off a bridge. She lives at home but spends a lot of time with her fiance. Both parents are disabled. She feels that she has been a primary caregiver to her parents since early childhood. Dad has bipolar disorder and paranoid schizophrenia. Mom has Chiari malformation and fibromyalgia as well as anxiety and depression. She "buries her feelings" but will reach out to her fiance at times.  >>  10/08/2018 ([name] Lomax, NP): <<Kristen [name] is a 38 y.o. female here today with concerns of worsening headaches. She reports that over the past few months she has started having headaches 10-15 days of the month. 4-5 of these are migrainous with nausea, light and sound sensitivity. Headaches are typically a tension/pressure type pain. Migraines are usually unilateral behind the eye or in the back of her head. She does have blurred vision with headache. She has taken Aleve or ibuprofen on occasion for migraine abortion. She does have sleep apnea on CPAP. She admits that she has not used CPAP in several months. She had a lapse in insurance and was not able to get supplies. She does have a history of hypertension. She was on medication in the past but contributed her elevated readings to stress and was taken off medication. She does not check BP at home. She has not seen PCP recently. She is hoping to conceive in the near future.    >>  07/22/2017 Butch Penny, NP): <<Kristen [name] is a 38 year old female with a history of migraine headaches and obstructive sleep apnea.  She returns today for follow-up.  She has a new complaint today of memory loss.  She states that this has occurred in the last 2 to 3 weeks.  She reports that she forgets things that she is immediately told.  She reports that if she does not write it down she will not remember.  She reports that she can be in the middle of conversation and will forget what words she is searching for.  She lives at home with her  mother.  Reports that she manages her own finances.  Able to prepare her own meals.  Able to complete all ADLs independently.  No trouble operating a motor vehicle.  She reports that she does get stressed very easily.  Reports that she has had anxiety since she was a child.  She reports that her headaches are manageable.  She states that she has approximate 1 migraine a week.  She reports a daily dull headache.  She reports that the length of these headaches vary.  She is currently trying to conceive therefore she is not on any medication for her headaches.  Patient also reports staring events.  She reports that these have been witnessed by her mother and significant other.  Her mother reports that she will be staring right at her but will not respond.  The  patient reports that she uses a CPAP.  Her CPAP download indicates that she use her machine 21 out of 30 days for compliance of 70%.  She use her machine greater than 4 hours 14 out of 30 days for compliance of 47%.  This indicates suboptimal compliance.  On average she uses the machine 5 hours and 24 minutes.  She is on AutoSet 4 to 10 cm of water with EPR 3.  Her residual AHI is 1.1.  She does not have a significant leak. She returns today for an evaluation. >>  01/21/2017 Butch Penny, NP): <<Ms. Fogelman is a 38 year old female with a history of migraine headaches and obstructive sleep apnea.  She returns today for an evaluation.  She reports that she stopped Topamax because she is interested in getting pregnant.  She reports that her headache frequency has actually decreased.  Reports that she was having approximately 3 headaches a week and now she is only having one headache a week.  She states that when she does get a headache it typically occurs in the center of the forehead.  She does report photophobia but no phonophobia.  She denies nausea and vomiting.  She denies any significant changes with her vision.  Reports on occasion she will have some  blurred or double vision however she states that this is usually brief.  She states that she typically can take naproxen and lay down and her headache will resolve in approximately 2 hours.  The patient was diagnosed with mild to moderate sleep apnea.  CPAP therapy was suggested.  However the patient has not yet started using the CPAP.  She returns today for an evaluation.   >>  11/07/2016 (SA): 38 year old right-handed woman with an underlying medical history of anxiety, asthma, congenital third kidney present, depression, history of PVCs, migraine headaches, PCOS, and morbid obesity with a BMI of over 45, who reports snoring and excessive daytime somnolence. I reviewed your office note from 09/05/2016. BT is around MN and WT around 10 currently. She has occ. AM HAs, nocturia about 1-2/night. She has occasional RLS Sx and is known to move her feet in her sleep, per mom; mother has RLS.  She has bruxism and has an OTC guard, but has not molded it yet.  Her Epworth sleepiness score is 4 out of 24 today, fatigue score is 49 out of 63. She lives with her mother. She has no children. She is a nonsmoker and drinks alcohol occasionally, maybe twice a month, she drinks caffeine in the form of coffee, tea and soda, several servings per day.  She does nap daily at this time. 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  She is engaged, recently graduadet. Per mom, she snores and dad has OSA, on CPAP. She had a TE as a child.    10/17/2016 (Dr. Lucia Gaskins): <<Patient is here for follow up. 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 She has daily headaches with pressure, worse laying down when she can hear her heartbeat. No new vision changes but does have sensitivity to light and sounds. +nause and vomiting. LP was unable to measure opening pressure, she declines further LP. Will repeat imaging to follow hypophysitis.   >>  09/05/2016  (Dr. Lucia Gaskins): <<Kerryn LEANE SOLACHE is a 38 y.o. 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 She has PCOS and hyperprolactinemia. MRI of the brain was performed due to  elevated prolactin. She ha headaches daily, migraines 10 days a month. Daily headaches are more dull an din the front of the forehead and sometimes pressure all around the head, head feels full, when she sometimes hears her heart beat, she feels there is something in her ears. The migraines feel like intense pressure across the forehead, sometimes throbbing, light and sound sensitivity, nausea. Feels like she has to vomit. Mother has migraines. Can be painful, can last all day up to 3 days. Didn't feel better after draining fluid. She sometimes wakes with headaches, she snores a lot heavily, mother has noticed she stops breathing in the middle of the night. She takes naproxen but not more than 10x a month, ice packs and quiet help. She has had a recent TB neg. she has blurry vision, no frank double vision although her vision does get blurry.No other focal neurologic deficits, associated symptoms, inciting events or modifiable factors.       Reviewed notes, labs and imaging from outside physicians, which showed:   Personally reviewed MRI of the brain images 07/14/2016 and agree with the following: 1. No focal pituitary lesion. 2. Thickening and enhancement of the pituitary stalk. 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 Pituitary adenoma can extending to the pituitary stalk. Granulomatous disease including neurosarcoidosis can have a similar appearance. No other basilar enhancement is evident to suggest granulomatous disease. Follow-up imaging after treatment could be performed for further evaluation.   >>  Her Past Medical History Is Significant For: Past Medical History:  Diagnosis Date   Anxiety 2013   treated at [facility] by Deatra Robinson    Asthma December 26, 1987   no hospitalization, or intubation,  previously on advair and singulair. asthma worsening.  Follows w/ Dr. Jonah Blue @ [facility]. Per pt on 01/17/23, she only uses inhaler if she is doing strenous exercise or during the change of seasons.   Chronic diarrhea    Follows w/ [facility].   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   Dysfunctional uterine bleeding    Dysrhythmia 2010   PVC's, palpitations   Ehlers-Danlos syndrome    Follows with Dr. Burnard Leigh, Sports Medicine and PCP.   Essential hypertension 03/12/2019   Follows w/ [facility] cardiology.   GERD (gastroesophageal reflux disease)    History of hiatal hernia    History of suicidal ideation    IBS (irritable bowel syndrome)    Insulin resistance    Follows w/ endocrinology.   Kyphosis of thoracic region 2004   painful, runs in dad side    Lymphocytic hypophysitis (HCC)    Follows w/ endocrinology.   Migraine    Follows w/ PCP.   Orthostatic hypotension    Follows w/ [facility], Eligha Bridegroom, NP.   Pancreatitis due to biliary obstruction 2003   PCOS (polycystic ovarian syndrome) 2014   facial hair, irregular periods, never been pregnant    PTSD (post-traumatic stress disorder)    Follows w/ Behavioral Health.   Sleep apnea    not currently wearing Cpap   Social phobia    Follows w/ Behavioral Health.    Her Past Surgical History Is Significant For: Past Surgical History:  Procedure Laterality Date   BIOPSY  06/11/2021   Procedure: BIOPSY;  Surgeon: Napoleon Form, MD;  Location: [facility];  Service: Gastroenterology;;   BLADDER SURGERY  bladder stretched as a child   CHOLECYSTECTOMY  2003   COLONOSCOPY WITH PROPOFOL N/A 06/11/2021   Procedure: COLONOSCOPY WITH PROPOFOL;  Surgeon: Napoleon Form, MD;  Location: [facility];  Service: Gastroenterology;  Laterality: N/A;   DILATATION & CURETTAGE/HYSTEROSCOPY WITH  MYOSURE N/A 12/14/2019   Procedure: DILATATION & CURETTAGE/HYSTEROSCOPY WITH Polypectomy with MYOSURE;  Surgeon: Tereso Newcomer, MD;  Location: [facility] OR;  Service: Gynecology;  Laterality: N/A;   INTRAUTERINE DEVICE (IUD) INSERTION N/A 01/21/2023   Procedure: INTRAUTERINE DEVICE (IUD) INSERTION(MIRENA);  Surgeon: Adam Phenix, MD;  Location: [facility];  Service: Gynecology;  Laterality: N/A;   MOHS SURGERY Left 11/2022   Right side and left leg   OPEN REDUCTION INTERNAL FIXATION (ORIF) PROXIMAL PHALANX Right 07/23/2013   Procedure: OPEN TREATMENT RIGHT RING FINGER, PROXIMAL INTERPHALANGEAL/JOINT FRACTURE DISLOCATION;  Surgeon: Jodi Marble, MD;  Location:  SURGERY CENTER;  Service: Orthopedics;  Laterality: Right;   TONSILLECTOMY     and adenoidectomy    Her Family History Is Significant For: Family History  Problem Relation Age of Onset   Hypertension Mother    Arnold-Chiari malformation Mother    Restless legs syndrome Mother    Osteoporosis Mother    COPD Mother    Asthma Mother    Squamous cell carcinoma Mother        skin    Eczema Mother    Arthritis Mother    Peripheral Artery Disease Mother    Anxiety disorder Mother    OCD Mother    Colonic polyp Mother    Stroke Mother    Hypertension Father    Scoliosis Father    Bipolar disorder Father    Congestive Heart Failure Father    COPD Father    Depression Father    Diabetes Maternal Grandmother    Hypertension Maternal Grandmother    Dementia Maternal Grandmother    OCD Maternal Grandmother    Alzheimer's disease Maternal Grandmother    Bone cancer Maternal Grandfather 72       bone marrow    Multiple myeloma Maternal Grandfather    Alcohol abuse Maternal Grandfather    Drug abuse Maternal Grandfather    Cancer Paternal Grandmother        colon   Diabetes Paternal Grandfather    Alcohol abuse Paternal Grandfather    Drug abuse Paternal Grandfather    Dementia Paternal  Grandfather    Alcohol abuse Maternal Aunt    Depression Maternal Aunt    Pancreatic cancer Maternal Aunt    Alcohol abuse Paternal Aunt    Depression Paternal Aunt    ADD / ADHD Cousin     Her Social History Is Significant For: Social History   Socioeconomic History   Marital status: Single    Spouse name: Not on file   Number of children: 0   Years of education: college    Highest education level: Not on file  Occupational History   Occupation: Unemployed   Tobacco Use   Smoking status: Never    Passive exposure: Past   Smokeless tobacco: Never  Vaping Use   Vaping status: Never Used  Substance and Sexual Activity   Alcohol use: No   Drug use: No   Sexual activity: Yes    Birth control/protection: I.U.D.  Other Topics Concern   Not on file  Social History Narrative   Lives with mom Steward Drone)    Right-handed   Caffeine: 1 cups of coffee per day (rare)  Working not working.  Disabled.    Social Drivers of Health   Financial Resource Strain: High Risk (12/30/2022)   Overall Financial Resource Strain (CARDIA)    Difficulty of Paying Living Expenses: Hard  Food Insecurity: Food Insecurity Present (12/30/2022)   Hunger Vital Sign    Worried About Running Out of Food in the Last Year: Often true    Ran Out of Food in the Last Year: Sometimes true  Transportation Needs: No Transportation Needs (12/30/2022)   PRAPARE - Administrator, Civil Service (Medical): No    Lack of Transportation (Non-Medical): No  Physical Activity: Insufficiently Active (12/30/2022)   Exercise Vital Sign    Days of Exercise per Week: 2 days    Minutes of Exercise per Session: 10 min  Stress: Stress Concern Present (12/30/2022)   [employer] of Occupational Health - Occupational Stress Questionnaire    Feeling of Stress : Rather much  Social Connections: Socially Isolated (12/30/2022)   Social Connection and Isolation Panel [NHANES]    Frequency of Communication with  Friends and Family: Once a week    Frequency of Social Gatherings with Friends and Family: Once a week    Attends Religious Services: Never    Database administrator or Organizations: No    Attends Engineer, structural: Never    Marital Status: Never married    Her Allergies Are:  Allergies  Allergen Reactions   Latex Hives and Swelling   Mucinex [Guaifenesin Er] Hives, Itching and Swelling   Other Anaphylaxis and Nausea Only    Pickles - throat swelling and nausea    Penicillins Swelling    Did it involve swelling of the face/tongue/throat, SOB, or low BP? Yes Did it involve sudden or severe rash/hives, skin peeling, or any reaction on the inside of your mouth or nose? No Did you need to seek medical attention at a hospital or doctor's office? Yes When did it last happen?      >10 years ago If all above answers are "NO", may proceed with cephalosporin use.    Contrast Media [Iodinated Contrast Media] Nausea And Vomiting   Multihance [Gadobenate] Nausea And Vomiting  :   Her Current Medications Are:  Outpatient Encounter Medications as of 02/20/2023  Medication Sig   albuterol (VENTOLIN HFA) 108 (90 Base) MCG/ACT inhaler Inhale 2 puffs into the lungs every 6 (six) hours as needed for wheezing or shortness of breath.   Benzocaine-Resorcinol (VAGISIL EX) Apply 1 application. topically as needed.   busPIRone (BUSPAR) 10 MG tablet Take 1 tablet (10 mg total) by mouth 3 (three) times daily.   Calcium Carb-Cholecalciferol (CALCIUM 500 + D PO) Take 1 tablet by mouth daily at 6 (six) AM.   Calcium-Phosphorus-Vitamin D (CALCIUM/VITAMIN D3/ADULT GUMMY) 250-100-500 MG-MG-UNIT CHEW Chew by mouth daily at 6 (six) AM.   colestipol (COLESTID) 1 g tablet Take 2 tablets (2 g total) by mouth 2 (two) times daily.   diclofenac Sodium (VOLTAREN) 1 % GEL Apply 2 g topically 4 (four) times daily. (Patient taking differently: Apply 2 g topically as needed.)   hydrOXYzine (ATARAX) 25 MG tablet  Take 1 tablet (25 mg total) by mouth 4 (four) times daily.   ipratropium-albuterol (DUONEB) 0.5-2.5 (3) MG/3ML SOLN Take 3 mLs by nebulization every 4 (four) hours as needed.   loperamide (IMODIUM A-D) 2 MG tablet Take 2 mg by mouth 4 (four) times daily as needed for diarrhea or loose stools.   medroxyPROGESTERone (PROVERA) 5  MG tablet Take 2 tablets (10 mg total) by mouth daily. For 15 days for amenorrhea   metoprolol tartrate (LOPRESSOR) 25 MG tablet Take 1 tablet (25 mg total) by mouth 2 (two) times daily.   naproxen (NAPROSYN) 375 MG tablet Take 1 tablet (375 mg total) by mouth 2 (two) times daily with a meal. (Patient taking differently: Take 375 mg by mouth as needed.)   omeprazole (PRILOSEC) 40 MG capsule Take 1 capsule (40 mg total) by mouth daily before breakfast.   propranolol (INDERAL) 10 MG tablet Take 2 tablets (20 mg total) by mouth 3 (three) times daily as needed (palpitations).   QUEtiapine (SEROQUEL) 100 MG tablet Take 1 tablet (100 mg total) by mouth at bedtime.   sertraline (ZOLOFT) 100 MG tablet Take 2 tablets (200 mg total) by mouth at bedtime.   traZODone (DESYREL) 100 MG tablet Take 1 tablet (100 mg total) by mouth at bedtime as needed for sleep.   gabapentin (NEURONTIN) 300 MG capsule TAKE 1 CAPSULE (300 MG TOTAL) BY MOUTH AT BEDTIME. (Patient taking differently: Take 300 mg by mouth as needed (pain).)   No facility-administered encounter medications on file as of 02/20/2023.  :   Review of Systems:  Out of a complete 14 point review of systems, all are reviewed and negative with the exception of these symptoms as listed below:   Review of Systems  Neurological:        Re eval for OSA.  Brought machine for DL.  Not using.  Had SS GNA.      Objective:  Neurological Exam  Physical Exam Physical Examination:   Vitals:   02/20/23 1351  BP: (!) 169/93  Pulse: 77    General Examination: The patient is a very pleasant 38 y.o. female in no acute distress. She  appears well-developed and well-nourished and well groomed.   HEENT: Normocephalic, atraumatic, pupils are equal, round and reactive to light, extraocular tracking is good without limitation to gaze excursion or nystagmus noted. Hearing is grossly intact. Face is symmetric with normal facial animation. Speech is clear with no dysarthria noted. There is no hypophonia. There is no lip, neck/head, jaw or voice tremor. Neck is supple with full range of passive and active motion. There are no carotid bruits on auscultation. Oropharynx exam reveals: moderate mouth dryness, adequate dental hygiene and mild airway crowding, due to small airway entry.  Mallampati class II.  Tonsils absent, neck circumference 16 three-quarter inches.  Tongue protrudes centrally and palate elevates symmetrically.  She has a mild overbite.  Chest: Clear to auscultation without wheezing, rhonchi or crackles noted.  Heart: S1+S2+0, regular and normal without murmurs, rubs or gallops noted.   Abdomen: Soft, non-tender and non-distended.  Extremities: There is trace pitting edema in the distal lower extremities bilaterally.   Skin: Warm and dry without trophic changes noted.   Musculoskeletal: exam reveals no obvious joint deformities.   Neurologically:  Mental status: The patient is awake, alert and oriented in all 4 spheres. Her immediate and remote memory, attention, language skills and fund of knowledge are appropriate. There is no evidence of aphasia, agnosia, apraxia or anomia. Speech is clear with normal prosody and enunciation. Thought process is linear. Mood is normal and affect is normal.  Cranial nerves II - XII are as described above under HEENT exam.  Motor exam: Normal bulk, strength and tone is noted. There is no obvious action or resting tremor.  Fine motor skills and coordination: grossly intact.  Cerebellar testing:  No dysmetria or intention tremor. There is no truncal or gait ataxia.  Sensory exam: intact to  light touch in the upper and lower extremities.  Gait, station and balance: She stands easily. No veering to one side is noted. No leaning to one side is noted. Posture is age-appropriate and stance is narrow based. Gait shows normal stride length and normal pace. No problems turning are noted.   Assessment and Plan:  In summary, Daina Perigo is a very pleasant 38 y.o.-year old female 38 year old female with an underlying medical history of hypertension, Ehlers-Danlos syndrome, Raynaud's syndrome, PCOS, IBS, reflux disease, migraine headaches (followed by Dr. Lucia Gaskins and Shawnie Dapper, NP in the past), asthma, anxiety, depression, and morbid obesity with a BMI of over 50, who presents for evaluation of her obstructive sleep apnea.  She was diagnosed with overall mild obstructive sleep apnea in 2018.  She was started on AutoPap therapy but has not used her machine for the past few years.  She desires reevaluation.  We mutually agreed to proceed with a home sleep test at this time.  Talked about the importance of maintaining a healthy lifestyle and weight management.  We talked about the importance of maintaining good sleep hygiene.  She is requesting a follow-up for migraine headaches.  She is advised that we will need a new referral to Dr. Lucia Gaskins as she has not been seen in her headache clinic in a few years.  We talked about the diagnosis of obstructive sleep apnea, its prognosis and treatment options.  She would be willing to restart PAP therapy if the need arises.  We will plan a follow-up after testing.  I answered all their questions today and the patient and her mother were in agreement.   Thank you very much for allowing me to participate in the care of this nice patient. If I can be of any further assistance to you please do not hesitate to call me at [phone].  Sincerely,   Huston Foley, MD, PhD

## 2023-02-24 NOTE — Anesthesia Preprocedure Evaluation (Addendum)
Anesthesia Evaluation  Patient identified by MRN, date of birth, ID band Patient awake    Reviewed: Allergy & Precautions, NPO status , Patient's Chart, lab work & pertinent test results  Airway Mallampati: II  TM Distance: >3 FB Neck ROM: Full    Dental no notable dental hx. (+) Teeth Intact, Dental Advisory Given   Pulmonary asthma , sleep apnea    Pulmonary exam normal breath sounds clear to auscultation       Cardiovascular hypertension, Normal cardiovascular exam Rhythm:Regular Rate:Normal     Neuro/Psych  Headaches  Anxiety Depression       GI/Hepatic ,GERD  ,,  Endo/Other    Class 4 obesity  Renal/GU      Musculoskeletal   Abdominal   Peds  Hematology   Anesthesia Other Findings   Reproductive/Obstetrics                             Anesthesia Physical Anesthesia Plan  ASA: 3  Anesthesia Plan: MAC   Post-op Pain Management: Minimal or no pain anticipated   Induction:   PONV Risk Score and Plan: Propofol infusion and Treatment may vary due to age or medical condition  Airway Management Planned: Natural Airway and Simple Face Mask  Additional Equipment: None  Intra-op Plan:   Post-operative Plan:   Informed Consent: I have reviewed the patients History and Physical, chart, labs and discussed the procedure including the risks, benefits and alternatives for the proposed anesthesia with the patient or authorized representative who has indicated his/her understanding and acceptance.     Dental advisory given  Plan Discussed with: CRNA and Anesthesiologist  Anesthesia Plan Comments: (EGD for GERD)        Anesthesia Quick Evaluation

## 2023-02-25 ENCOUNTER — Encounter (HOSPITAL_COMMUNITY): Payer: Self-pay | Admitting: Gastroenterology

## 2023-02-25 ENCOUNTER — Encounter (HOSPITAL_COMMUNITY)
Admission: RE | Disposition: A | Discharge status: Home / Self Care | Payer: Self-pay | Source: Ambulatory Visit | Attending: Gastroenterology

## 2023-02-25 ENCOUNTER — Ambulatory Visit (HOSPITAL_COMMUNITY): Payer: MEDICAID | Admitting: Anesthesiology

## 2023-02-25 ENCOUNTER — Other Ambulatory Visit: Payer: Self-pay

## 2023-02-25 ENCOUNTER — Ambulatory Visit (HOSPITAL_COMMUNITY)
Admission: RE | Admit: 2023-02-25 | Discharge: 2023-02-25 | Disposition: A | Discharge status: Home / Self Care | Payer: MEDICAID | Source: Ambulatory Visit | Attending: Gastroenterology | Admitting: Gastroenterology

## 2023-02-25 DIAGNOSIS — K2950 Unspecified chronic gastritis without bleeding: Secondary | ICD-10-CM | POA: Insufficient documentation

## 2023-02-25 DIAGNOSIS — J4520 Mild intermittent asthma, uncomplicated: Secondary | ICD-10-CM

## 2023-02-25 DIAGNOSIS — J45909 Unspecified asthma, uncomplicated: Secondary | ICD-10-CM | POA: Diagnosis not present

## 2023-02-25 DIAGNOSIS — K921 Melena: Secondary | ICD-10-CM

## 2023-02-25 DIAGNOSIS — K219 Gastro-esophageal reflux disease without esophagitis: Secondary | ICD-10-CM

## 2023-02-25 DIAGNOSIS — K2100 Gastro-esophageal reflux disease with esophagitis, without bleeding: Secondary | ICD-10-CM | POA: Diagnosis not present

## 2023-02-25 DIAGNOSIS — K2980 Duodenitis without bleeding: Secondary | ICD-10-CM | POA: Diagnosis not present

## 2023-02-25 DIAGNOSIS — R1013 Epigastric pain: Secondary | ICD-10-CM | POA: Diagnosis present

## 2023-02-25 DIAGNOSIS — K449 Diaphragmatic hernia without obstruction or gangrene: Secondary | ICD-10-CM | POA: Diagnosis not present

## 2023-02-25 DIAGNOSIS — K269 Duodenal ulcer, unspecified as acute or chronic, without hemorrhage or perforation: Secondary | ICD-10-CM

## 2023-02-25 DIAGNOSIS — G4730 Sleep apnea, unspecified: Secondary | ICD-10-CM | POA: Insufficient documentation

## 2023-02-25 DIAGNOSIS — F419 Anxiety disorder, unspecified: Secondary | ICD-10-CM | POA: Diagnosis not present

## 2023-02-25 DIAGNOSIS — K2289 Other specified disease of esophagus: Secondary | ICD-10-CM | POA: Diagnosis not present

## 2023-02-25 DIAGNOSIS — K200 Eosinophilic esophagitis: Secondary | ICD-10-CM

## 2023-02-25 DIAGNOSIS — K580 Irritable bowel syndrome with diarrhea: Secondary | ICD-10-CM

## 2023-02-25 DIAGNOSIS — Z6841 Body Mass Index (BMI) 40.0 and over, adult: Secondary | ICD-10-CM | POA: Diagnosis not present

## 2023-02-25 DIAGNOSIS — G8929 Other chronic pain: Secondary | ICD-10-CM

## 2023-02-25 DIAGNOSIS — I10 Essential (primary) hypertension: Secondary | ICD-10-CM

## 2023-02-25 DIAGNOSIS — E6689 Other obesity not elsewhere classified: Secondary | ICD-10-CM | POA: Insufficient documentation

## 2023-02-25 DIAGNOSIS — Z5986 Financial insecurity: Secondary | ICD-10-CM | POA: Diagnosis not present

## 2023-02-25 DIAGNOSIS — K529 Noninfective gastroenteritis and colitis, unspecified: Secondary | ICD-10-CM

## 2023-02-25 DIAGNOSIS — F32A Depression, unspecified: Secondary | ICD-10-CM | POA: Diagnosis not present

## 2023-02-25 DIAGNOSIS — K3189 Other diseases of stomach and duodenum: Secondary | ICD-10-CM | POA: Diagnosis not present

## 2023-02-25 DIAGNOSIS — G90A Postural orthostatic tachycardia syndrome (POTS): Secondary | ICD-10-CM

## 2023-02-25 HISTORY — PX: ESOPHAGOGASTRODUODENOSCOPY (EGD) WITH PROPOFOL: SHX5813

## 2023-02-25 HISTORY — PX: BIOPSY: SHX5522

## 2023-02-25 SURGERY — ESOPHAGOGASTRODUODENOSCOPY (EGD) WITH PROPOFOL
Anesthesia: Monitor Anesthesia Care

## 2023-02-25 MED ORDER — PROPOFOL 500 MG/50ML IV EMUL
INTRAVENOUS | Status: AC
  Filled 2023-02-25: qty 50

## 2023-02-25 MED ORDER — SODIUM CHLORIDE 0.9 % IV SOLN: INTRAVENOUS | Status: DC

## 2023-02-25 MED ORDER — LIDOCAINE 2% (20 MG/ML) 5 ML SYRINGE
INTRAMUSCULAR | Status: DC | PRN
  Administered 2023-02-25: 100 mg via INTRAVENOUS

## 2023-02-25 MED ORDER — COLESTIPOL HCL 1 G PO TABS
2.0000 g | ORAL_TABLET | Freq: Three times a day (TID) | ORAL | 0 refills | Status: DC
  Filled 2023-02-25: qty 120, 20d supply, fill #0

## 2023-02-25 MED ORDER — PROPOFOL 10 MG/ML IV BOLUS
INTRAVENOUS | Status: DC | PRN
  Administered 2023-02-25: 30 mg via INTRAVENOUS
  Administered 2023-02-25: 10 mg via INTRAVENOUS

## 2023-02-25 MED ORDER — FLUCONAZOLE 100 MG PO TABS
100.0000 mg | ORAL_TABLET | Freq: Every day | ORAL | 0 refills | Status: AC
Start: 2023-02-25 — End: 2023-03-28
  Filled 2023-02-25: qty 30, 30d supply, fill #0

## 2023-02-25 MED ORDER — PROPOFOL 500 MG/50ML IV EMUL
INTRAVENOUS | Status: DC | PRN
  Administered 2023-02-25: 130 ug/kg/min via INTRAVENOUS

## 2023-02-25 SURGICAL SUPPLY — 14 items

## 2023-02-25 NOTE — Anesthesia Postprocedure Evaluation (Signed)
Anesthesia Post Note  Patient: Marybel Dankert  Procedure(s) Performed: ESOPHAGOGASTRODUODENOSCOPY (EGD) WITH PROPOFOL BIOPSY     Patient location during evaluation: Endoscopy Anesthesia Type: MAC Level of consciousness: awake and alert Pain management: pain level controlled Vital Signs Assessment: post-procedure vital signs reviewed and stable Respiratory status: spontaneous breathing, nonlabored ventilation, respiratory function stable and patient connected to nasal cannula oxygen Cardiovascular status: blood pressure returned to baseline and stable Postop Assessment: no apparent nausea or vomiting Anesthetic complications: no   No notable events documented.  Last Vitals:  Vitals:   02/25/23 0920 02/25/23 0930  BP: 116/75 137/88  Pulse: (!) 49 (!) 46  Resp: 13 12  Temp:    SpO2: 100% 100%    Last Pain:  Vitals:   02/25/23 0930  TempSrc:   PainSc: 0-No pain                 Trevor Iha

## 2023-02-25 NOTE — Transfer of Care (Signed)
Immediate Anesthesia Transfer of Care Note  Patient: Kristen [name]  Procedure(s) Performed: ESOPHAGOGASTRODUODENOSCOPY (EGD) WITH PROPOFOL BIOPSY  Patient Location: PACU  Anesthesia Type:MAC  Level of Consciousness: sedated  Airway & Oxygen Therapy: Patient Spontanous Breathing and Patient connected to face mask oxygen  Post-op Assessment: Report given to RN and Post -op Vital signs reviewed and stable  Post vital signs: Reviewed and stable  Last Vitals:  Vitals Value Taken Time  BP    Temp    Pulse    Resp    SpO2      Last Pain:  Vitals:   02/25/23 0728  TempSrc: Temporal  PainSc: 0-No pain         Complications: No notable events documented.

## 2023-02-25 NOTE — Discharge Instructions (Signed)
YOU HAD AN ENDOSCOPIC PROCEDURE TODAY: Refer to the procedure report and other information in the discharge instructions given to you for any specific questions about what was found during the examination. If this information does not answer your questions, please call  office at [phone] to clarify.  ° °YOU SHOULD EXPECT: Some feelings of bloating in the abdomen. Passage of more gas than usual. Walking can help get rid of the air that was put into your GI tract during the procedure and reduce the bloating. If you had a lower endoscopy (such as a colonoscopy or flexible sigmoidoscopy) you may notice spotting of blood in your stool or on the toilet paper. Some abdominal soreness may be present for a day or two, also. ° °DIET: Your first meal following the procedure should be a light meal and then it is ok to progress to your normal diet. A half-sandwich or bowl of soup is an example of a good first meal. Heavy or fried foods are harder to digest and may make you feel nauseous or bloated. Drink plenty of fluids but you should avoid alcoholic beverages for 24 hours. If you had a esophageal dilation, please see attached instructions for diet.   ° °ACTIVITY: Your care partner should take you home directly after the procedure. You should plan to take it easy, moving slowly for the rest of the day. You can resume normal activity the day after the procedure however YOU SHOULD NOT DRIVE, use power tools, machinery or perform tasks that involve climbing or major physical exertion for 24 hours (because of the sedation medicines used during the test).  ° °SYMPTOMS TO REPORT IMMEDIATELY: °A gastroenterologist can be reached at any hour. Please call [phone]  for any of the following symptoms:  °Following lower endoscopy (colonoscopy, flexible sigmoidoscopy) °Excessive amounts of blood in the stool  °Significant tenderness, worsening of abdominal pains  °Swelling of the abdomen that is new, acute  °Fever of 100° or  higher  °Following upper endoscopy (EGD, EUS, ERCP, esophageal dilation) °Vomiting of blood or coffee ground material  °New, significant abdominal pain  °New, significant chest pain or pain under the shoulder blades  °Painful or persistently difficult swallowing  °New shortness of breath  °Black, tarry-looking or red, bloody stools ° °FOLLOW UP:  °If any biopsies were taken you will be contacted by phone or by letter within the next 1-3 weeks. Call [phone]  if you have not heard about the biopsies in 3 weeks.  °Please also call with any specific questions about appointments or follow up tests. ° °

## 2023-02-25 NOTE — Op Note (Addendum)
[facility] Patient Name: Kristen [name] Procedure Date: 02/25/2023 MRN: [id number] Attending MD: Napoleon Form , MD, [id number] Date of Birth: [birth year] CSN: [id number] Age: 38 Admit Type: Outpatient Procedure:                Upper GI endoscopy Indications:              Epigastric abdominal pain, Esophageal reflux                            symptoms that persist despite appropriate therapy Providers:                Napoleon Form, MD, Lorenza Evangelist, RN, Rhodia Albright, Technician, Marja Kays, Technician Referring MD:              Medicines:                Monitored Anesthesia Care Complications:            No immediate complications. Estimated Blood Loss:     Estimated blood loss was minimal. Procedure:                Pre-Anesthesia Assessment:                           - Prior to the procedure, a History and Physical                            was performed, and patient medications and                            allergies were reviewed. The patient's tolerance of                            previous anesthesia was also reviewed. The risks                            and benefits of the procedure and the sedation                            options and risks were discussed with the patient.                            All questions were answered, and informed consent                            was obtained. Prior Anticoagulants: The patient has                            taken no anticoagulant or antiplatelet agents. ASA                            Grade Assessment: III - A patient with severe  systemic disease. After reviewing the risks and                            benefits, the patient was deemed in satisfactory                            condition to undergo the procedure.                           After obtaining informed consent, the endoscope was                            passed under direct  vision. Throughout the                            procedure, the patient's blood pressure, pulse, and                            oxygen saturations were monitored continuously. The                            GIF-H190 ([id number]) Olympus endoscope was introduced                            through the mouth, and advanced to the second part                            of duodenum. The upper GI endoscopy was                            accomplished without difficulty. The patient                            tolerated the procedure well. Scope In: Scope Out: Findings:      LA Grade C (one or more mucosal breaks continuous between tops of 2 or       more mucosal folds, less than 75% circumference) esophagitis with no       bleeding was found 34 to 35 cm from the incisors. Biopsies were taken       with a cold forceps for histology.      Patchy, white plaques were found in the mid esophagus and in the distal       esophagus. Biopsies were taken with a cold forceps for histology.      A 2 cm hiatal hernia was present.      Patchy mild inflammation characterized by erythema and friability was       found in the prepyloric region of the stomach and at the pylorus.       Biopsies were taken with a cold forceps for Helicobacter pylori testing.      The cardia and gastric fundus were normal on retroflexion.      A few erosions without bleeding were found in the duodenal bulb.       Biopsies were taken with a cold forceps for histology.      The second portion of the duodenum was normal. Impression:               -  LA Grade C reflux esophagitis with no bleeding.                            Biopsied.                           - Esophageal plaques were found, suspicious for                            candidiasis. Biopsied.                           - 2 cm hiatal hernia.                           - Gastritis. Biopsied.                           - Duodenal erosions without bleeding. Biopsied.                            - Normal second portion of the duodenum. Moderate Sedation:      N/A Recommendation:           - Resume previous diet.                           - Continue present medications.                           - Await pathology results.                           - No aspirin, ibuprofen, naproxen, or other                            non-steroidal anti-inflammatory drugs.                           - Diflucan (fluconazole) 100 mg PO daily for 3                            weeks.                           - Follow up in GI office visit in 6-8 weeks, next                            available appointment Procedure Code(s):        --- Professional ---                           [phone], Esophagogastroduodenoscopy, flexible,                            transoral; with biopsy, single or multiple Diagnosis Code(s):        --- Professional ---  K21.00, Gastro-esophageal reflux disease with                            esophagitis, without bleeding                           K22.9, Disease of esophagus, unspecified                           K44.9, Diaphragmatic hernia without obstruction or                            gangrene                           K29.70, Gastritis, unspecified, without bleeding                           K26.9, Duodenal ulcer, unspecified as acute or                            chronic, without hemorrhage or perforation                           R10.13, Epigastric pain CPT copyright 2022 American Medical Association. All rights reserved. The codes documented in this report are preliminary and upon coder review may  be revised to meet current compliance requirements. Napoleon Form, MD 02/25/2023 9:20:03 AM This report has been signed electronically. Number of Addenda: 0

## 2023-02-25 NOTE — H&P (Signed)
[facility] History and Physical   Primary Care Physician:  Marcine Matar, MD   Reason for Procedure:   GERD, epigastric and upper abdominal pain  Plan:    EGD with possible intervention     HPI: Kristen [name] is a 38 y.o. female here for EGD for evaluation of chronic epigastric abdominal pain and GERD  The risks and benefits as well as alternatives of endoscopic procedure(s) have been discussed and reviewed. All questions answered. The patient agrees to proceed.     Past Medical History:  Diagnosis Date   Anxiety 2013   treated at [facility] by Deatra Robinson    Asthma 09-27-1987   no hospitalization, or intubation, previously on advair and singulair. asthma worsening.  Follows w/ Dr. Jonah Blue @ [place]. Per pt on 01/17/23, she only uses inhaler if she is doing strenous exercise or during the change of seasons.   Chronic diarrhea    Follows w/ [facility].   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   Dysfunctional uterine bleeding    Dysrhythmia 2010   PVC's, palpitations   Ehlers-Danlos syndrome    Follows with Dr. Burnard Leigh, Sports Medicine and PCP.   Essential hypertension 03/12/2019   Follows w/ [facility] cardiology.   GERD (gastroesophageal reflux disease)    History of hiatal hernia    History of suicidal ideation    IBS (irritable bowel syndrome)    Insulin resistance    Follows w/ endocrinology.   Kyphosis of thoracic region 2004   painful, runs in dad side    Lymphocytic hypophysitis (HCC)    Follows w/ endocrinology.   Migraine    Follows w/ PCP.   Orthostatic hypotension    Follows w/ [facility], Eligha Bridegroom, NP.   Pancreatitis due to biliary obstruction 2003   PCOS (polycystic ovarian syndrome) 2014   facial hair, irregular periods, never been pregnant    PTSD (post-traumatic stress disorder)    Follows w/  Behavioral Health.   Sleep apnea    not currently wearing Cpap   Social phobia    Follows w/ Behavioral Health.    Past Surgical History:  Procedure Laterality Date   BIOPSY  06/11/2021   Procedure: BIOPSY;  Surgeon: Napoleon Form, MD;  Location: [facility];  Service: Gastroenterology;;   BLADDER SURGERY     bladder stretched as a child   CHOLECYSTECTOMY  2003   COLONOSCOPY WITH PROPOFOL N/A 06/11/2021   Procedure: COLONOSCOPY WITH PROPOFOL;  Surgeon: Napoleon Form, MD;  Location: [facility];  Service: Gastroenterology;  Laterality: N/A;   DILATATION & CURETTAGE/HYSTEROSCOPY WITH MYOSURE N/A 12/14/2019   Procedure: DILATATION & CURETTAGE/HYSTEROSCOPY WITH Polypectomy with MYOSURE;  Surgeon: Tereso Newcomer, MD;  Location: [facility] OR;  Service: Gynecology;  Laterality: N/A;   INTRAUTERINE DEVICE (IUD) INSERTION N/A 01/21/2023   Procedure: INTRAUTERINE DEVICE (IUD) INSERTION(MIRENA);  Surgeon: Adam Phenix, MD;  Location: [facility];  Service: Gynecology;  Laterality: N/A;   MOHS SURGERY Left 11/2022   Right side and left leg   OPEN REDUCTION INTERNAL FIXATION (ORIF) PROXIMAL PHALANX Right 07/23/2013   Procedure: OPEN TREATMENT RIGHT RING FINGER, PROXIMAL INTERPHALANGEAL/JOINT FRACTURE DISLOCATION;  Surgeon: Jodi Marble, MD;  Location: [facility];  Service: Orthopedics;  Laterality: Right;   TONSILLECTOMY     and adenoidectomy  Prior to Admission medications   Medication Sig Start Date End Date Taking? Authorizing Provider  albuterol (VENTOLIN HFA) 108 (90 Base) MCG/ACT inhaler Inhale 2 puffs into the lungs every 6 (six) hours as needed for wheezing or shortness of breath. 06/27/22  Yes McClung, Angela M, PA-C  busPIRone (BUSPAR) 10 MG tablet Take 1 tablet (10 mg total) by mouth 3 (three) times daily. 01/16/23  Yes Toy Cookey E, NP  Calcium Carb-Cholecalciferol (CALCIUM 500 + D PO) Take 1 tablet by mouth daily at 6 (six) AM.    Yes [provider]  Calcium-Phosphorus-Vitamin D (CALCIUM/VITAMIN D3/ADULT GUMMY) 250-100-500 MG-MG-UNIT CHEW Chew by mouth daily at 6 (six) AM.   Yes [provider]  colestipol (COLESTID) 1 g tablet Take 2 tablets (2 g total) by mouth 2 (two) times daily. 12/20/22  Yes Quentin Mulling R, PA-C  gabapentin (NEURONTIN) 300 MG capsule TAKE 1 CAPSULE (300 MG TOTAL) BY MOUTH AT BEDTIME. Patient taking differently: Take 300 mg by mouth as needed (pain). 03/24/20 02/25/23 Yes Marcine Matar, MD  hydrOXYzine (ATARAX) 25 MG tablet Take 1 tablet (25 mg total) by mouth 4 (four) times daily. 01/16/23  Yes Toy Cookey E, NP  ipratropium-albuterol (DUONEB) 0.5-2.5 (3) MG/3ML SOLN Take 3 mLs by nebulization every 4 (four) hours as needed.   Yes [provider]  loperamide (IMODIUM A-D) 2 MG tablet Take 2 mg by mouth 4 (four) times daily as needed for diarrhea or loose stools.   Yes [provider]  metoprolol tartrate (LOPRESSOR) 25 MG tablet Take 1 tablet (25 mg total) by mouth 2 (two) times daily. 12/30/22  Yes Marcine Matar, MD  naproxen (NAPROSYN) 375 MG tablet Take 1 tablet (375 mg total) by mouth 2 (two) times daily with a meal. Patient taking differently: Take 375 mg by mouth as needed. 06/15/21  Yes Palumbo, April, MD  omeprazole (PRILOSEC) 40 MG capsule Take 1 capsule (40 mg total) by mouth daily before breakfast. 12/20/22  Yes Doree Albee, PA-C  propranolol (INDERAL) 10 MG tablet Take 2 tablets (20 mg total) by mouth 3 (three) times daily as needed (palpitations). 07/08/22  Yes Swinyer, Zachary George, NP  QUEtiapine (SEROQUEL) 100 MG tablet Take 1 tablet (100 mg total) by mouth at bedtime. 01/16/23  Yes Toy Cookey E, NP  sertraline (ZOLOFT) 100 MG tablet Take 2 tablets (200 mg total) by mouth at bedtime. 01/16/23  Yes Toy Cookey E, NP  traZODone (DESYREL) 100 MG tablet Take 1 tablet (100 mg total) by mouth at bedtime as needed for sleep.  01/16/23  Yes Shanna Cisco, NP  Benzocaine-Resorcinol (VAGISIL EX) Apply 1 application. topically as needed.    [provider]  diclofenac Sodium (VOLTAREN) 1 % GEL Apply 2 g topically 4 (four) times daily. Patient taking differently: Apply 2 g topically as needed. 06/21/21   Marcine Matar, MD  medroxyPROGESTERone (PROVERA) 5 MG tablet Take 2 tablets (10 mg total) by mouth daily. For 15 days for amenorrhea 10/01/22   Adam Phenix, MD    Current Facility-Administered Medications  Medication Dose Route Frequency Provider Last Rate Last Admin   0.9 %  sodium chloride infusion   Intravenous Continuous Quentin Mulling R, PA-C        Allergies as of 12/20/2022 - Review Complete 12/20/2022  Allergen Reaction Noted   Mucinex [guaifenesin er] Hives, Itching, and Swelling 08/27/2011   Penicillins Swelling 08/23/2016   Contrast media [iodinated contrast media] Nausea And Vomiting 08/23/2016  Latex Hives and Swelling 12/13/2019   Multihance [gadobenate] Nausea And Vomiting 07/14/2016   Other  12/13/2019    Family History  Problem Relation Age of Onset   Hypertension Mother    Arnold-Chiari malformation Mother    Restless legs syndrome Mother    Osteoporosis Mother    COPD Mother    Asthma Mother    Squamous cell carcinoma Mother        skin    Eczema Mother    Arthritis Mother    Peripheral Artery Disease Mother    Anxiety disorder Mother    OCD Mother    Colonic polyp Mother    Stroke Mother    Hypertension Father    Scoliosis Father    Bipolar disorder Father    Congestive Heart Failure Father    COPD Father    Depression Father    Diabetes Maternal Grandmother    Hypertension Maternal Grandmother    Dementia Maternal Grandmother    OCD Maternal Grandmother    Alzheimer's disease Maternal Grandmother    Bone cancer Maternal Grandfather 46       bone marrow    Multiple myeloma Maternal Grandfather    Alcohol abuse Maternal Grandfather    Drug abuse  Maternal Grandfather    Cancer Paternal Grandmother        colon   Diabetes Paternal Grandfather    Alcohol abuse Paternal Grandfather    Drug abuse Paternal Grandfather    Dementia Paternal Grandfather    Alcohol abuse Maternal Aunt    Depression Maternal Aunt    Pancreatic cancer Maternal Aunt    Alcohol abuse Paternal Aunt    Depression Paternal Aunt    ADD / ADHD Cousin     Social History   Socioeconomic History   Marital status: Single    Spouse name: Not on file   Number of children: 0   Years of education: college    Highest education level: Not on file  Occupational History   Occupation: Unemployed   Tobacco Use   Smoking status: Never    Passive exposure: Past   Smokeless tobacco: Never  Vaping Use   Vaping status: Never Used  Substance and Sexual Activity   Alcohol use: No   Drug use: No   Sexual activity: Yes    Birth control/protection: I.U.D.  Other Topics Concern   Not on file  Social History Narrative   Lives with mom Steward Drone)    Right-handed   Caffeine: 1 cups of coffee per day (rare)   Working not working.  Disabled.    Social Drivers of Health   Financial Resource Strain: High Risk (12/30/2022)   Overall Financial Resource Strain (CARDIA)    Difficulty of Paying Living Expenses: Hard  Food Insecurity: Food Insecurity Present (12/30/2022)   Hunger Vital Sign    Worried About Running Out of Food in the Last Year: Often true    Ran Out of Food in the Last Year: Sometimes true  Transportation Needs: No Transportation Needs (12/30/2022)   PRAPARE - Administrator, Civil Service (Medical): No    Lack of Transportation (Non-Medical): No  Physical Activity: Insufficiently Active (12/30/2022)   Exercise Vital Sign    Days of Exercise per Week: 2 days    Minutes of Exercise per Session: 10 min  Stress: Stress Concern Present (12/30/2022)   [employer] of Occupational Health - Occupational Stress Questionnaire    Feeling of Stress  : Rather much  Social Connections: Socially Isolated (12/30/2022)   Social Connection and Isolation Panel [NHANES]    Frequency of Communication with Friends and Family: Once a week    Frequency of Social Gatherings with Friends and Family: Once a week    Attends Religious Services: Never    Database administrator or Organizations: No    Attends Banker Meetings: Never    Marital Status: Never married  Intimate Partner Violence: Not At Risk (12/30/2022)   Humiliation, Afraid, Rape, and Kick questionnaire    Fear of Current or Ex-Partner: No    Emotionally Abused: No    Physically Abused: No    Sexually Abused: No    Review of Systems:  All other review of systems negative except as mentioned in the HPI.  Physical Exam: Vital signs in last 24 hours: Temp:  [97.2 F (36.2 C)] 97.2 F (36.2 C) (01/21 0728) Pulse Rate:  [67] 67 (01/21 0728) Resp:  [14] 14 (01/21 0728) BP: (134)/(89) 134/89 (01/21 0728) SpO2:  [99 %] 99 % (01/21 0728) Weight:  [140.6 kg] 140.6 kg (01/21 0728)   General:   Alert, NAD Lungs:  Clear .   Heart:  Regular rate and rhythm Abdomen:  Soft, nontender and nondistended. Neuro/Psych:  Alert and cooperative. Normal mood and affect. A and O x 3   K. Scherry Ran , MD [phone]

## 2023-02-26 ENCOUNTER — Other Ambulatory Visit: Payer: Self-pay

## 2023-02-26 LAB — SURGICAL PATHOLOGY

## 2023-02-27 ENCOUNTER — Encounter (HOSPITAL_COMMUNITY): Payer: Self-pay | Admitting: Gastroenterology

## 2023-03-04 ENCOUNTER — Encounter: Payer: Self-pay | Admitting: Gastroenterology

## 2023-03-07 ENCOUNTER — Other Ambulatory Visit: Payer: Self-pay

## 2023-03-07 ENCOUNTER — Ambulatory Visit
Admission: RE | Admit: 2023-03-07 | Discharge: 2023-03-07 | Disposition: A | Discharge status: Home / Self Care | Payer: MEDICAID | Source: Ambulatory Visit | Attending: Internal Medicine | Admitting: Internal Medicine

## 2023-03-07 VITALS — BP 126/90 | HR 97 | Temp 97.9°F | Resp 18

## 2023-03-07 DIAGNOSIS — J069 Acute upper respiratory infection, unspecified: Secondary | ICD-10-CM | POA: Insufficient documentation

## 2023-03-07 LAB — POCT INFLUENZA A/B
Influenza A, POC: NEGATIVE
Influenza B, POC: NEGATIVE

## 2023-03-07 MED ORDER — PROMETHAZINE-DM 6.25-15 MG/5ML PO SYRP
5.0000 mL | ORAL_SOLUTION | Freq: Every evening | ORAL | 0 refills | Status: DC | PRN
Start: 2023-03-07 — End: 2023-04-15
  Filled 2023-03-07: qty 118, 23d supply, fill #0

## 2023-03-07 NOTE — Discharge Instructions (Addendum)
 You have a viral illness which will improve on its own with rest, fluids, and medications to help with your symptoms.  Tylenol, guaifenesin (plain mucinex), and saline nasal sprays may help relieve symptoms.  Promethazine DM at bedtime as needed for cough.   Two teaspoons of honey in 1 cup of warm water every 4-6 hours may help with throat pains.  Humidifier in room at nighttime may help soothe cough (clean well daily).   For chest pain, shortness of breath, inability to keep food or fluids down without vomiting, fever that does not respond to tylenol or motrin, or any other severe symptoms, please go to the ER for further evaluation. Return to urgent care as needed, otherwise follow-up with PCP.

## 2023-03-07 NOTE — ED Triage Notes (Signed)
Pt c/o cough, congestion, hot/ cold flashes, feeling restless, lingering sore throat, drainage, body aches for 3 days

## 2023-03-08 ENCOUNTER — Encounter: Payer: Self-pay | Admitting: Obstetrics and Gynecology

## 2023-03-08 ENCOUNTER — Encounter: Payer: Self-pay | Admitting: Internal Medicine

## 2023-03-08 ENCOUNTER — Telehealth: Payer: Self-pay | Admitting: Internal Medicine

## 2023-03-08 LAB — SARS CORONAVIRUS 2 (TAT 6-24 HRS): SARS Coronavirus 2: POSITIVE — AB

## 2023-03-08 NOTE — Telephone Encounter (Signed)
Patient calls requesting paxlovid due to positive COVID test.  She is not a candidate for this due to taking Seroquel. I called patient back using 2 patient identifiers to discuss this with her. Today is day 4 of symptoms and she agrees to defer paxlovid/molnupiravir prescription.  She is improving overall but remains with fever. Taking tylenol and motrin as prescribed as well as mucinex and promethazine DM as discussed at visit.  Discussed ER and urgent care return precautions.  Patient expresses understanding and agreement with plan.

## 2023-03-08 NOTE — ED Provider Notes (Signed)
[name]    CSN: [id number] Arrival date & time: 03/07/23  1115      History   Chief Complaint Chief Complaint  Patient presents with   Sore Throat    Maybe the flu? Symptoms: cough, congestion, sore throat, fatigue, earache - Entered by patient    HPI Kristen [name] is a 38 y.o. female.   Kristen [name] is a 38 y.o. female presenting for chief complaint of sore throat, congestion, cough, body aches, and hot sweats/cold chills that started 3 days ago. Cough is minimally productive. She is unsure of max temp at home. History of asthma, has access to rescue inhaler if needed. Denies CP, SOB, palpitations, N/V/D, abdominal pain, rash, and dizziness. Never smoker, denies drug use. Taking OTC medicines with some relief.    Sore Throat    Past Medical History:  Diagnosis Date   Anxiety 2013   treated at [facility] by Deatra Robinson    Asthma 1989   no hospitalization, or intubation, previously on advair and singulair. asthma worsening.  Follows w/ Dr. Jonah Blue @ [facility]. Per pt on 01/17/23, she only uses inhaler if she is doing strenous exercise or during the change of seasons.   Chronic diarrhea    Follows w/ [facility].   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   Dysfunctional uterine bleeding    Dysrhythmia 2010   PVC's, palpitations   Ehlers-Danlos syndrome    Follows with Dr. Burnard Leigh, Sports Medicine and PCP.   Essential hypertension 03/12/2019   Follows w/ [facility] cardiology.   GERD (gastroesophageal reflux disease)    History of hiatal hernia    History of suicidal ideation    IBS (irritable bowel syndrome)    Insulin resistance    Follows w/ endocrinology.   Kyphosis of thoracic region 2004   painful, runs in dad side    Lymphocytic hypophysitis (HCC)    Follows w/ endocrinology.   Migraine    Follows w/ PCP.    Orthostatic hypotension    Follows w/ [facility], Eligha Bridegroom, NP.   Pancreatitis due to biliary obstruction 2003   PCOS (polycystic ovarian syndrome) 2014   facial hair, irregular periods, never been pregnant    PTSD (post-traumatic stress disorder)    Follows w/ Behavioral Health.   Sleep apnea    not currently wearing Cpap   Social phobia    Follows w/ Behavioral Health.    Patient Active Problem List   Diagnosis Date Noted   Melena 02/25/2023   Abdominal pain, chronic, epigastric 02/25/2023   Diarrhea    Chronic diarrhea    Weight gain 11/01/2020   Hirsutism 11/01/2020   Gastroesophageal reflux disease without esophagitis 07/11/2020   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   POTS (postural orthostatic tachycardia syndrome) 06/09/2020   Morbid obesity (HCC) 12/14/2019   Endometrial polyp    Encounter for autism screening 11/18/2019   DUB (dysfunctional uterine bleeding) 11/08/2019   PTSD (post-traumatic stress disorder) 09/20/2019   Irritable bowel syndrome with diarrhea 04/23/2019   Abnormal serum thyroid stimulating hormone (TSH) level 04/23/2019   OSA on CPAP 03/17/2019   Anxiety and depression 03/12/2019   Chronic migraine with aura 03/12/2019   Essential hypertension 03/12/2019   Mild intermittent asthma without complication 03/12/2019   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   Elevated prolactin level 07/05/2016  Vulvar lesion 07/05/2016   Restless legs 05/24/2016   Seasonal allergies 05/13/2014   Intertrigo 05/13/2014   Decreased vision 05/13/2014   PCOS (polycystic ovarian syndrome) 02/07/2014   Social phobia 02/02/2007   Attention deficit disorder 02/02/2007   DYSLEXIA 02/02/2007   Asthma, severe persistent 02/02/2007   Idiopathic scoliosis and kyphoscoliosis 02/02/2007   OTHER SPECIFIED CONGENITAL ANOMALIES OF KIDNEY 02/02/2007   ELECTROCARDIOGRAM, ABNORMAL 02/02/2007    Past Surgical History:  Procedure Laterality Date   BIOPSY   06/11/2021   Procedure: BIOPSY;  Surgeon: Napoleon Form, MD;  Location: [facility];  Service: Gastroenterology;;   BIOPSY  02/25/2023   Procedure: BIOPSY;  Surgeon: Napoleon Form, MD;  Location: [facility];  Service: Gastroenterology;;   BLADDER SURGERY     bladder stretched as a child   CHOLECYSTECTOMY  2003   COLONOSCOPY WITH PROPOFOL N/A 06/11/2021   Procedure: COLONOSCOPY WITH PROPOFOL;  Surgeon: Napoleon Form, MD;  Location: [facility];  Service: Gastroenterology;  Laterality: N/A;   DILATATION & CURETTAGE/HYSTEROSCOPY WITH MYOSURE N/A 12/14/2019   Procedure: DILATATION & CURETTAGE/HYSTEROSCOPY WITH Polypectomy with MYOSURE;  Surgeon: Tereso Newcomer, MD;  Location: [facility];  Service: Gynecology;  Laterality: N/A;   ESOPHAGOGASTRODUODENOSCOPY (EGD) WITH PROPOFOL N/A 02/25/2023   Procedure: ESOPHAGOGASTRODUODENOSCOPY (EGD) WITH PROPOFOL;  Surgeon: Napoleon Form, MD;  Location: [facility];  Service: Gastroenterology;  Laterality: N/A;   INTRAUTERINE DEVICE (IUD) INSERTION N/A 01/21/2023   Procedure: INTRAUTERINE DEVICE (IUD) INSERTION(MIRENA);  Surgeon: Adam Phenix, MD;  Location[facility];  Service: Gynecology;  Laterality: N/A;   MOHS SURGERY Left 11/2022   Right side and left leg   OPEN REDUCTION INTERNAL FIXATION (ORIF) PROXIMAL PHALANX Right 07/23/2013   Procedure: OPEN TREATMENT RIGHT RING FINGER, PROXIMAL INTERPHALANGEAL/JOINT FRACTURE DISLOCATION;  Surgeon: Jodi Marble, MD;  Location: [facility];  Service: Orthopedics;  Laterality: Right;   TONSILLECTOMY     and adenoidectomy    OB History     Gravida  0   Para      Term      Preterm      AB      Living         SAB      IAB      Ectopic      Multiple      Live Births               Home Medications    Prior to Admission medications   Medication Sig Start Date End Date Taking? Authorizing Provider  promethazine-dextromethorphan  (PROMETHAZINE-DM) 6.25-15 MG/5ML syrup Take 5 mLs by mouth at bedtime as needed for cough. 03/07/23  Yes Carlisle Beers, FNP  albuterol (VENTOLIN HFA) 108 (90 Base) MCG/ACT inhaler Inhale 2 puffs into the lungs every 6 (six) hours as needed for wheezing or shortness of breath. 06/27/22   McClung, Marzella Schlein, PA-C  Benzocaine-Resorcinol (VAGISIL EX) Apply 1 application. topically as needed.    [provider]  busPIRone (BUSPAR) 10 MG tablet Take 1 tablet (10 mg total) by mouth 3 (three) times daily. 01/16/23   Shanna Cisco, NP  Calcium Carb-Cholecalciferol (CALCIUM 500 + D PO) Take 1 tablet by mouth daily at 6 (six) AM.    [provider]  Calcium-Phosphorus-Vitamin D (CALCIUM/VITAMIN D3/ADULT GUMMY) 250-100-500 MG-MG-UNIT CHEW Chew by mouth daily at 6 (six) AM.    [provider]  colestipol (COLESTID) 1 g tablet Take 2 tablets (2 g  total) by mouth 3 (three) times daily. 02/25/23   Napoleon Form, MD  diclofenac Sodium (VOLTAREN) 1 % GEL Apply 2 g topically 4 (four) times daily. Patient taking differently: Apply 2 g topically as needed. 06/21/21   Marcine Matar, MD  fluconazole (DIFLUCAN) 100 MG tablet Take 1 tablet (100 mg total) by mouth daily. 02/25/23 03/28/23  Napoleon Form, MD  gabapentin (NEURONTIN) 300 MG capsule TAKE 1 CAPSULE (300 MG TOTAL) BY MOUTH AT BEDTIME. Patient taking differently: Take 300 mg by mouth as needed (pain). 03/24/20 02/25/23  Marcine Matar, MD  hydrOXYzine (ATARAX) 25 MG tablet Take 1 tablet (25 mg total) by mouth 4 (four) times daily. 01/16/23   Toy Cookey E, NP  ipratropium-albuterol (DUONEB) 0.5-2.5 (3) MG/3ML SOLN Take 3 mLs by nebulization every 4 (four) hours as needed.    [provider]  loperamide (IMODIUM A-D) 2 MG tablet Take 2 mg by mouth 4 (four) times daily as needed for diarrhea or loose stools.    [provider]  medroxyPROGESTERone (PROVERA) 5 MG tablet Take 2 tablets (10 mg  total) by mouth daily. For 15 days for amenorrhea 10/01/22   Adam Phenix, MD  metoprolol tartrate (LOPRESSOR) 25 MG tablet Take 1 tablet (25 mg total) by mouth 2 (two) times daily. 12/30/22   Marcine Matar, MD  omeprazole (PRILOSEC) 40 MG capsule Take 1 capsule (40 mg total) by mouth daily before breakfast. 12/20/22   Doree Albee, PA-C  propranolol (INDERAL) 10 MG tablet Take 2 tablets (20 mg total) by mouth 3 (three) times daily as needed (palpitations). 07/08/22   Swinyer, Zachary George, NP  QUEtiapine (SEROQUEL) 100 MG tablet Take 1 tablet (100 mg total) by mouth at bedtime. 01/16/23   Shanna Cisco, NP  sertraline (ZOLOFT) 100 MG tablet Take 2 tablets (200 mg total) by mouth at bedtime. 01/16/23   Shanna Cisco, NP  traZODone (DESYREL) 100 MG tablet Take 1 tablet (100 mg total) by mouth at bedtime as needed for sleep. 01/16/23   Shanna Cisco, NP    Family History Family History  Problem Relation Age of Onset   Hypertension Mother    Arnold-Chiari malformation Mother    Restless legs syndrome Mother    Osteoporosis Mother    COPD Mother    Asthma Mother    Squamous cell carcinoma Mother        skin    Eczema Mother    Arthritis Mother    Peripheral Artery Disease Mother    Anxiety disorder Mother    OCD Mother    Colonic polyp Mother    Stroke Mother    Hypertension Father    Scoliosis Father    Bipolar disorder Father    Congestive Heart Failure Father    COPD Father    Depression Father    Diabetes Maternal Grandmother    Hypertension Maternal Grandmother    Dementia Maternal Grandmother    OCD Maternal Grandmother    Alzheimer's disease Maternal Grandmother    Bone cancer Maternal Grandfather 47       bone marrow    Multiple myeloma Maternal Grandfather    Alcohol abuse Maternal Grandfather    Drug abuse Maternal Grandfather    Cancer Paternal Grandmother        colon   Diabetes Paternal Grandfather    Alcohol abuse Paternal Grandfather     Drug abuse Paternal Grandfather    Dementia Paternal Grandfather  Alcohol abuse Maternal Aunt    Depression Maternal Aunt    Pancreatic cancer Maternal Aunt    Alcohol abuse Paternal Aunt    Depression Paternal Aunt    ADD / ADHD Cousin     Social History Social History   Tobacco Use   Smoking status: Never    Passive exposure: Past   Smokeless tobacco: Never  Vaping Use   Vaping status: Never Used  Substance Use Topics   Alcohol use: No   Drug use: No     Allergies   Latex, Mucinex [guaifenesin er], Other, Penicillins, Contrast media [iodinated contrast media], and Multihance [gadobenate]   Review of Systems Review of Systems Per HPI  Physical Exam Triage Vital Signs ED Triage Vitals  Encounter Vitals Group     BP 03/07/23 1123 (!) 126/90     Systolic BP Percentile --      Diastolic BP Percentile --      Pulse Rate 03/07/23 1123 97     Resp 03/07/23 1123 18     Temp 03/07/23 1123 97.9 F (36.6 C)     Temp Source 03/07/23 1123 Oral     SpO2 03/07/23 1123 95 %     Weight --      Height --      Head Circumference --      Peak Flow --      Pain Score 03/07/23 1125 6     Pain Loc --      Pain Education --      Exclude from Growth Chart --    No data found.  Updated Vital Signs BP (!) 126/90 (BP Location: Right Arm)   Pulse 97   Temp 97.9 F (36.6 C) (Oral)   Resp 18   LMP 02/25/2023 (Exact Date)   SpO2 95%   Visual Acuity Right Eye Distance:   Left Eye Distance:   Bilateral Distance:    Right Eye Near:   Left Eye Near:    Bilateral Near:     Physical Exam Vitals and nursing note reviewed.  Constitutional:      Appearance: She is not ill-appearing or toxic-appearing.  HENT:     Head: Normocephalic and atraumatic.     Right Ear: Hearing, tympanic membrane, ear canal and external ear normal.     Left Ear: Hearing, tympanic membrane, ear canal and external ear normal.     Nose: Congestion present.     Mouth/Throat:     Lips: Pink.      Mouth: Mucous membranes are moist. No injury or oral lesions.     Dentition: Normal dentition.     Tongue: No lesions.     Pharynx: Oropharynx is clear. Uvula midline. No pharyngeal swelling, oropharyngeal exudate, posterior oropharyngeal erythema, uvula swelling or postnasal drip.     Tonsils: No tonsillar exudate.  Eyes:     General: Lids are normal. Vision grossly intact. Gaze aligned appropriately.     Extraocular Movements: Extraocular movements intact.     Conjunctiva/sclera: Conjunctivae normal.  Neck:     Trachea: Trachea and phonation normal.  Cardiovascular:     Rate and Rhythm: Normal rate and regular rhythm.     Heart sounds: Normal heart sounds, S1 normal and S2 normal.  Pulmonary:     Effort: Pulmonary effort is normal. No respiratory distress.     Breath sounds: Normal breath sounds and air entry.  Musculoskeletal:     Cervical back: Neck supple.  Lymphadenopathy:  Cervical: No cervical adenopathy.  Skin:    General: Skin is warm and dry.     Capillary Refill: Capillary refill takes less than 2 seconds.     Findings: No rash.  Neurological:     General: No focal deficit present.     Mental Status: She is alert and oriented to person, place, and time. Mental status is at baseline.     Cranial Nerves: No dysarthria or facial asymmetry.  Psychiatric:        Mood and Affect: Mood normal.        Speech: Speech normal.        Behavior: Behavior normal.        Thought Content: Thought content normal.        Judgment: Judgment normal.      UC Treatments / Results  Labs (all labs ordered are listed, but only abnormal results are displayed)  EKG   Radiology No results found.  Procedures Procedures (including critical care time)  Medications Ordered in UC Medications - No data to display  Initial Impression / Assessment and Plan / UC Course  I have reviewed the triage vital signs and the nursing notes.  Pertinent labs & imaging results that were  available during my care of the patient were reviewed by me and considered in my medical decision making (see chart for details).   1. Viral URI with cough Suspect viral URI, viral syndrome.  Strep/viral testing: POC influenza A+B testing negative in clinic, PCR COVID testing pending, staff will call if positive.   No current signs of asthma exacerbation.   Physical exam findings reassuring, vital signs hemodynamically stable, and lungs clear, therefore deferred imaging of the chest.  Advised supportive care/prescriptions for symptomatic relief as outlined in AVS.    Counseled patient on potential for adverse effects with medications prescribed/recommended today, strict ER and return-to-clinic precautions discussed, patient verbalized understanding.    Final Clinical Impressions(s) / UC Diagnoses   Final diagnoses:  Viral URI with cough     Discharge Instructions      You have a viral illness which will improve on its own with rest, fluids, and medications to help with your symptoms.  Tylenol, guaifenesin (plain mucinex), and saline nasal sprays may help relieve symptoms.  Promethazine DM at bedtime as needed for cough.   Two teaspoons of honey in 1 cup of warm water every 4-6 hours may help with throat pains.  Humidifier in room at nighttime may help soothe cough (clean well daily).   For chest pain, shortness of breath, inability to keep food or fluids down without vomiting, fever that does not respond to tylenol or motrin, or any other severe symptoms, please go to the ER for further evaluation. Return to urgent care as needed, otherwise follow-up with PCP.      ED Prescriptions     Medication Sig Dispense Auth. Provider   promethazine-dextromethorphan (PROMETHAZINE-DM) 6.25-15 MG/5ML syrup Take 5 mLs by mouth at bedtime as needed for cough. 118 mL Carlisle Beers, FNP      PDMP not reviewed this encounter.   Carlisle Beers, [place] 03/08/23 [phone]

## 2023-03-10 ENCOUNTER — Ambulatory Visit: Payer: MEDICAID | Admitting: Obstetrics and Gynecology

## 2023-03-10 NOTE — Telephone Encounter (Signed)
Please help patient reschedule her appointment due to COVID.

## 2023-03-13 ENCOUNTER — Ambulatory Visit: Payer: MEDICAID | Admitting: Neurology

## 2023-03-13 DIAGNOSIS — G4733 Obstructive sleep apnea (adult) (pediatric): Secondary | ICD-10-CM

## 2023-03-13 DIAGNOSIS — R351 Nocturia: Secondary | ICD-10-CM

## 2023-03-13 DIAGNOSIS — R0683 Snoring: Secondary | ICD-10-CM

## 2023-03-13 DIAGNOSIS — R519 Headache, unspecified: Secondary | ICD-10-CM

## 2023-03-13 DIAGNOSIS — G4719 Other hypersomnia: Secondary | ICD-10-CM

## 2023-03-17 ENCOUNTER — Encounter: Payer: Self-pay | Admitting: Neurology

## 2023-03-21 ENCOUNTER — Other Ambulatory Visit: Payer: Self-pay

## 2023-03-21 ENCOUNTER — Encounter (HOSPITAL_COMMUNITY): Payer: Self-pay | Admitting: Psychiatry

## 2023-03-21 ENCOUNTER — Telehealth (HOSPITAL_COMMUNITY): Payer: MEDICAID | Admitting: Psychiatry

## 2023-03-21 DIAGNOSIS — F4010 Social phobia, unspecified: Secondary | ICD-10-CM | POA: Diagnosis not present

## 2023-03-21 DIAGNOSIS — F331 Major depressive disorder, recurrent, moderate: Secondary | ICD-10-CM

## 2023-03-21 MED ORDER — HYDROXYZINE HCL 25 MG PO TABS
25.0000 mg | ORAL_TABLET | Freq: Four times a day (QID) | ORAL | 3 refills | Status: DC
  Filled 2023-03-21: qty 120, 30d supply, fill #0

## 2023-03-21 MED ORDER — BUSPIRONE HCL 10 MG PO TABS
10.0000 mg | ORAL_TABLET | Freq: Three times a day (TID) | ORAL | 3 refills | Status: DC
  Filled 2023-03-21: qty 90, 30d supply, fill #0

## 2023-03-21 MED ORDER — QUETIAPINE FUMARATE 100 MG PO TABS
100.0000 mg | ORAL_TABLET | Freq: Every day | ORAL | 3 refills | Status: DC
  Filled 2023-03-21: qty 30, 30d supply, fill #0

## 2023-03-21 MED ORDER — TRAZODONE HCL 100 MG PO TABS
100.0000 mg | ORAL_TABLET | Freq: Every evening | ORAL | 3 refills | Status: DC | PRN
  Filled 2023-03-21: qty 30, 30d supply, fill #0

## 2023-03-21 MED ORDER — SERTRALINE HCL 100 MG PO TABS
200.0000 mg | ORAL_TABLET | Freq: Every day | ORAL | 3 refills | Status: DC
  Filled 2023-03-21: qty 60, 30d supply, fill #0

## 2023-03-21 NOTE — Progress Notes (Signed)
BH MD/PA/NP OP Progress Note Virtual Visit via Video Note  I connected with Kristen [name] on 03/21/23 at  9:00 AM EST by a video enabled telemedicine application and verified that I am speaking with the correct person using two identifiers.  Location: Patient: Home Provider: Clinic   I discussed the limitations of evaluation and management by telemedicine and the availability of in person appointments. The patient expressed understanding and agreed to proceed.  I provided 30 minutes of non-face-to-face time during this encounter.      03/21/2023 9:22 AM Kristen [name]  MRN:  [id number]  Chief Complaint: "I am recovering from covid"  HPI:  38 year old female seen today for follow up psychiatric evaluation.  She has a psychiatric history of social phobia, ADD, anxiety, depression, and PTSD.  She is currently being managed on Seroquel  100 mg nightly, Buspar 10 mg three times daily,  Zoloft 200 mg daily, trazodone 50 mg nightly as needed, hydroxyzine 25 mg 4 times a day as needed, gabapentin 300 mg nightly (prescribed by PCP), and propanolol 20 mg 2 times daily (recives from PCP).  She notes that her medications are somewhat effective in managing her psychiatric conditions.   Today patient was well-groomed, pleasant, cooperative, engaged in conversation, and maintained eye contact.  She informed provider that she is recovering from Covid. She notes that it has impacted her appetite noting that its poor. She also notes that her sleep is poor. Mentally patient notes that she is going though. She reports that her cat may have cancer and was sent to [facility] for treatment. She also notes that she is concerned about politics. Patient notes that she her mother continues to struggle physically but has assistance with a physical therapist for her back issues. Financially patient notes that she is str is struggling.  Patient reports that the above exacerbated her anxiety and depression  but reports that it is situational.  She requests that her medications not be adjusted.  Today provider also conducted PHQ-9 and patient scored an 18, at her last visit she scored a 22.  Patient endorses passive SI but denies wanting to harm herself. She denies SI/HI/VAH, mania, or paranoia.    Patient reports that she is able to cope with above stressors.  No medication changes were made today.  Patient agreeable to continue medications as prescribed.  No other concerns noted at this time.      Visit Diagnosis:    ICD-10-CM   1. Social phobia  F40.10 traZODone (DESYREL) 100 MG tablet    sertraline (ZOLOFT) 100 MG tablet    hydrOXYzine (ATARAX) 25 MG tablet    busPIRone (BUSPAR) 10 MG tablet    2. Moderate episode of recurrent major depressive disorder (HCC)  F33.1 traZODone (DESYREL) 100 MG tablet    sertraline (ZOLOFT) 100 MG tablet    QUEtiapine (SEROQUEL) 100 MG tablet    busPIRone (BUSPAR) 10 MG tablet          Past Psychiatric History: social phobia, ADD, anxiety, depression, and PTSD.  Past Medical History:  Past Medical History:  Diagnosis Date   Anxiety 2013   treated at [facility] by Deatra Robinson    Asthma 1989   no hospitalization, or intubation, previously on advair and singulair. asthma worsening.  Follows w/ Dr. Jonah Blue @ [place]. Per pt on 01/17/23, she only uses inhaler if she is doing strenous exercise or during the change of seasons.   Chronic diarrhea  Follows w/ [facility].   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   Dysfunctional uterine bleeding    Dysrhythmia 2010   PVC's, palpitations   Ehlers-Danlos syndrome    Follows with Dr. Burnard Leigh, Sports Medicine and PCP.   Essential hypertension 03/12/2019   Follows w/ [facility] cardiology.   GERD (gastroesophageal reflux disease)    History of hiatal hernia    History of suicidal ideation     IBS (irritable bowel syndrome)    Insulin resistance    Follows w/ endocrinology.   Kyphosis of thoracic region 2004   painful, runs in dad side    Lymphocytic hypophysitis (HCC)    Follows w/ endocrinology.   Migraine    Follows w/ PCP.   Orthostatic hypotension    Follows w/ [facility], Eligha Bridegroom, NP.   Pancreatitis due to biliary obstruction 2003   PCOS (polycystic ovarian syndrome) 2014   facial hair, irregular periods, never been pregnant    PTSD (post-traumatic stress disorder)    Follows w/ Behavioral Health.   Sleep apnea    not currently wearing Cpap   Social phobia    Follows w/ Behavioral Health.    Past Surgical History:  Procedure Laterality Date   BIOPSY  06/11/2021   Procedure: BIOPSY;  Surgeon: Napoleon Form, MD;  [facility];  Service: Gastroenterology;;   BIOPSY  02/25/2023   Procedure: BIOPSY;  Surgeon: Napoleon Form, MD;  [facility];  Service: Gastroenterology;;   BLADDER SURGERY     bladder stretched as a child   CHOLECYSTECTOMY  2003   COLONOSCOPY WITH PROPOFOL N/A 06/11/2021   Procedure: COLONOSCOPY WITH PROPOFOL;  Surgeon: Napoleon Form, MD;  [facility];  Service: Gastroenterology;  Laterality: N/A;   DILATATION & CURETTAGE/HYSTEROSCOPY WITH MYOSURE N/A 12/14/2019   Procedure: DILATATION & CURETTAGE/HYSTEROSCOPY WITH Polypectomy with MYOSURE;  Surgeon: Tereso Newcomer, MD;  [facility];  Service: Gynecology;  Laterality: N/A;   ESOPHAGOGASTRODUODENOSCOPY (EGD) WITH PROPOFOL N/A 02/25/2023   Procedure: ESOPHAGOGASTRODUODENOSCOPY (EGD) WITH PROPOFOL;  Surgeon: Napoleon Form, MD;  [facility];  Service: Gastroenterology;  Laterality: N/A;   INTRAUTERINE DEVICE (IUD) INSERTION N/A 01/21/2023   Procedure: INTRAUTERINE DEVICE (IUD) INSERTION(MIRENA);  Surgeon: Adam Phenix, MD;  [facility];  Service: Gynecology;  Laterality: N/A;   MOHS  SURGERY Left 11/2022   Right side and left leg   OPEN REDUCTION INTERNAL FIXATION (ORIF) PROXIMAL PHALANX Right 07/23/2013   Procedure: OPEN TREATMENT RIGHT RING FINGER, PROXIMAL INTERPHALANGEAL/JOINT FRACTURE DISLOCATION;  Surgeon: Jodi Marble, MD;  Location: [facility];  Service: Orthopedics;  Laterality: Right;   TONSILLECTOMY     and adenoidectomy    Family Psychiatric History: Mother PTSD, Father Bipolar disorder and PTSD  Family History:  Family History  Problem Relation Age of Onset   Hypertension Mother    Arnold-Chiari malformation Mother    Restless legs syndrome Mother    Osteoporosis Mother    COPD Mother    Asthma Mother    Squamous cell carcinoma Mother        skin    Eczema Mother    Arthritis Mother    Peripheral Artery Disease Mother    Anxiety disorder Mother    OCD Mother    Colonic polyp Mother    Stroke Mother  Hypertension Father    Scoliosis Father    Bipolar disorder Father    Congestive Heart Failure Father    COPD Father    Depression Father    Diabetes Maternal Grandmother    Hypertension Maternal Grandmother    Dementia Maternal Grandmother    OCD Maternal Grandmother    Alzheimer's disease Maternal Grandmother    Bone cancer Maternal Grandfather 76       bone marrow    Multiple myeloma Maternal Grandfather    Alcohol abuse Maternal Grandfather    Drug abuse Maternal Grandfather    Cancer Paternal Grandmother        colon   Diabetes Paternal Grandfather    Alcohol abuse Paternal Grandfather    Drug abuse Paternal Grandfather    Dementia Paternal Grandfather    Alcohol abuse Maternal Aunt    Depression Maternal Aunt    Pancreatic cancer Maternal Aunt    Alcohol abuse Paternal Aunt    Depression Paternal Aunt    ADD / ADHD Cousin     Social History:  Social History   Socioeconomic History   Marital status: Single    Spouse name: Not on file   Number of children: 0   Years of education: college    Highest  education level: Not on file  Occupational History   Occupation: Unemployed   Tobacco Use   Smoking status: Never    Passive exposure: Past   Smokeless tobacco: Never  Vaping Use   Vaping status: Never Used  Substance and Sexual Activity   Alcohol use: No   Drug use: No   Sexual activity: Yes    Birth control/protection: I.U.D.  Other Topics Concern   Not on file  Social History Narrative   Lives with mom Steward Drone)    Right-handed   Caffeine: 1 cups of coffee per day (rare)   Working not working.  Disabled.    Social Drivers of Health   Financial Resource Strain: High Risk (12/30/2022)   Overall Financial Resource Strain (CARDIA)    Difficulty of Paying Living Expenses: Hard  Food Insecurity: Food Insecurity Present (12/30/2022)   Hunger Vital Sign    Worried About Running Out of Food in the Last Year: Often true    Ran Out of Food in the Last Year: Sometimes true  Transportation Needs: No Transportation Needs (12/30/2022)   PRAPARE - Administrator, Civil Service (Medical): No    Lack of Transportation (Non-Medical): No  Physical Activity: Insufficiently Active (12/30/2022)   Exercise Vital Sign    Days of Exercise per Week: 2 days    Minutes of Exercise per Session: 10 min  Stress: Stress Concern Present (12/30/2022)   [employer] of Occupational Health - Occupational Stress Questionnaire    Feeling of Stress : Rather much  Social Connections: Socially Isolated (12/30/2022)   Social Connection and Isolation Panel [NHANES]    Frequency of Communication with Friends and Family: Once a week    Frequency of Social Gatherings with Friends and Family: Once a week    Attends Religious Services: Never    Database administrator or Organizations: No    Attends Banker Meetings: Never    Marital Status: Never married    Allergies:  Allergies  Allergen Reactions   Latex Hives and Swelling   Mucinex [Guaifenesin Er] Hives, Itching and Swelling    Other Anaphylaxis and Nausea Only    Pickles - throat swelling and nausea  Penicillins Swelling    Did it involve swelling of the face/tongue/throat, SOB, or low BP? Yes Did it involve sudden or severe rash/hives, skin peeling, or any reaction on the inside of your mouth or nose? No Did you need to seek medical attention at a hospital or doctor's office? Yes When did it last happen?      >10 years ago If all above answers are "NO", may proceed with cephalosporin use.    Contrast Media [Iodinated Contrast Media] Nausea And Vomiting   Multihance [Gadobenate] Nausea And Vomiting    Metabolic Disorder Labs: Lab Results  Component Value Date   HGBA1C 5.2 06/18/2022   MPG 93.93 04/15/2019   Lab Results  Component Value Date   PROLACTIN 10.7 06/19/2022   PROLACTIN 15.3 11/01/2020   Lab Results  Component Value Date   CHOL 177 06/18/2022   TRIG 128.0 06/18/2022   HDL 42.40 06/18/2022   CHOLHDL 4 06/18/2022   VLDL 25.6 06/18/2022   LDLCALC 109 (H) 06/18/2022   LDLCALC 134 (H) 04/15/2019   Lab Results  Component Value Date   TSH 3.01 06/18/2022   TSH 2.99 11/01/2020    Therapeutic Level Labs: No results found for: "LITHIUM" No results found for: "VALPROATE" No results found for: "CBMZ"  Current Medications: Current Outpatient Medications  Medication Sig Dispense Refill   albuterol (VENTOLIN HFA) 108 (90 Base) MCG/ACT inhaler Inhale 2 puffs into the lungs every 6 (six) hours as needed for wheezing or shortness of breath. 18 g 1   Benzocaine-Resorcinol (VAGISIL EX) Apply 1 application. topically as needed.     busPIRone (BUSPAR) 10 MG tablet Take 1 tablet (10 mg total) by mouth 3 (three) times daily. 90 tablet 3   Calcium Carb-Cholecalciferol (CALCIUM 500 + D PO) Take 1 tablet by mouth daily at 6 (six) AM.     Calcium-Phosphorus-Vitamin D (CALCIUM/VITAMIN D3/ADULT GUMMY) 250-100-500 MG-MG-UNIT CHEW Chew by mouth daily at 6 (six) AM.     colestipol (COLESTID) 1 g tablet  Take 2 tablets (2 g total) by mouth 3 (three) times daily. 120 tablet 0   diclofenac Sodium (VOLTAREN) 1 % GEL Apply 2 g topically 4 (four) times daily. (Patient taking differently: Apply 2 g topically as needed.) 100 g 0   fluconazole (DIFLUCAN) 100 MG tablet Take 1 tablet (100 mg total) by mouth daily. 30 tablet 0   gabapentin (NEURONTIN) 300 MG capsule TAKE 1 CAPSULE (300 MG TOTAL) BY MOUTH AT BEDTIME. (Patient taking differently: Take 300 mg by mouth as needed (pain).) 30 capsule 3   hydrOXYzine (ATARAX) 25 MG tablet Take 1 tablet (25 mg total) by mouth 4 (four) times daily. 120 tablet 3   ipratropium-albuterol (DUONEB) 0.5-2.5 (3) MG/3ML SOLN Take 3 mLs by nebulization every 4 (four) hours as needed.     loperamide (IMODIUM A-D) 2 MG tablet Take 2 mg by mouth 4 (four) times daily as needed for diarrhea or loose stools.     medroxyPROGESTERone (PROVERA) 5 MG tablet Take 2 tablets (10 mg total) by mouth daily. For 15 days for amenorrhea 30 tablet 1   metoprolol tartrate (LOPRESSOR) 25 MG tablet Take 1 tablet (25 mg total) by mouth 2 (two) times daily. 60 tablet 4   omeprazole (PRILOSEC) 40 MG capsule Take 1 capsule (40 mg total) by mouth daily before breakfast. 90 capsule 0   promethazine-dextromethorphan (PROMETHAZINE-DM) 6.25-15 MG/5ML syrup Take 5 mLs by mouth at bedtime as needed for cough. 118 mL 0   propranolol (INDERAL) 10  MG tablet Take 2 tablets (20 mg total) by mouth 3 (three) times daily as needed (palpitations). 60 tablet 11   QUEtiapine (SEROQUEL) 100 MG tablet Take 1 tablet (100 mg total) by mouth at bedtime. 30 tablet 3   sertraline (ZOLOFT) 100 MG tablet Take 2 tablets (200 mg total) by mouth at bedtime. 60 tablet 3   traZODone (DESYREL) 100 MG tablet Take 1 tablet (100 mg total) by mouth at bedtime as needed for sleep. 30 tablet 3   No current facility-administered medications for this visit.     Musculoskeletal: Strength & Muscle Tone: within normal limits and telehealth  visit Gait & Station: normal, telehealth visit Patient leans: N/A  Psychiatric Specialty Exam: Review of Systems  There were no vitals taken for this visit.There is no height or weight on file to calculate BMI.  General Appearance: Well Groomed  Eye Contact:  Good  Speech:  Clear and Coherent and Normal Rate  Volume:  Normal  Mood:  Anxious and Depressed  Affect:  Congruent  Thought Process:  Coherent, Goal Directed and Linear  Orientation:  Full (Time, Place, and Person)  Thought Content: Logical and Paranoid Ideation   Suicidal Thoughts:  No  Homicidal Thoughts:  No  Memory:  Immediate;   Good Recent;   Good Remote;   Good  Judgement:  Good  Insight:  Good  Psychomotor Activity:  Normal  Concentration:  Concentration: Good and Attention Span: Good  Recall:  Good  Fund of Knowledge: Good  Language: Good  Akathisia:  No  Handed:  Right  AIMS (if indicated): Not done  Assets:  Communication Skills Desire for Improvement Financial Resources/Insurance Housing Social Support  ADL's:  Intact  Cognition: WNL  Sleep:  Good   Screenings: AIMS    Flowsheet Row Ad[facility] (Discharged[facility]  AIMS Total Score 0      AUDIT    Flowsheet Row Admission (Discharged) from [facility]  Alcohol Use Disorder Identification Test Final Score (AUDIT) 0      GAD-7    Flowsheet Row Video Visit from 01/17/2023 in [facility] Office Visit from 12/30/2022 in [facility] [place] - A Dept Of [place] Video Visit from 11/20/2022 in [facility] Office Visit from 10/01/2022 in Center for [facility] Healthcare at [facility] Office Visit from 08/27/2022 in [facility] at [facility]  Total GAD-7 Score 18 16 13 13 15        PHQ2-9    Flowsheet Row Video Visit from 03/21/2023 in [facility] Video Visit from 01/17/2023 in [facility] Office Visit from 12/30/2022 in [facility] [place] - [facility] Dept Of [place] Video Visit from 11/20/2022 in [facility] Office Visit from 10/01/2022 in [facility] at [facility]  PHQ-2 Total Score 4 3 2 4 4   PHQ-9 Total Score 18 22 12 17 13       Flowsheet Row Video Visit from 03/21/2023 in [facility] ED from 03/07/2023 [facility]) Admission (Discharged) from 02/25/2023 in [facility] ENDOSCOPY  C-SSRS RISK CATEGORY Error: Q7 should not be populated when Q6 is No No Risk  No Risk        Assessment and Plan: Patient notes that she continues to be anxious and depressed but reports that it is situational. Patient reports that she is able to cope with above stressors.  No medication changes were made today.  Patient agreeable to continue medications as prescribed  1. Social phobia  Continue- busPIRone (BUSPAR) 10 MG tablet; Take 1 tablet (10 mg total) by mouth 3 (three) times daily.  Dispense: 90 tablet; Refill: 3 Continue- hydrOXYzine (ATARAX) 25 MG tablet; Take 1 tablet (25 mg total) by mouth 4 (four) times daily.  Dispense: 120 tablet; Refill: 3 Continue- traZODone (DESYREL) 100 MG tablet; Take 1 tablet (100 mg total) by mouth at bedtime as needed for sleep.  Dispense: 30 tablet; Refill: 3 Continue- sertraline (ZOLOFT) 100 MG tablet; Take 2 tablets (200 mg total) by mouth at bedtime.  Dispense: 60 tablet; Refill: 3  2. Moderate episode of recurrent major depressive disorder (HCC)  Continue- busPIRone (BUSPAR) 10 MG tablet; Take 1 tablet (10 mg total) by mouth 3 (three) times daily.  Dispense: 90 tablet; Refill: 3 Continue-  traZODone (DESYREL) 100 MG tablet; Take 1 tablet (100 mg total) by mouth at bedtime as needed for sleep.  Dispense: 30 tablet; Refill: 3 Continue- sertraline (ZOLOFT) 100 MG tablet; Take 2 tablets (200 mg total) by mouth at bedtime.  Dispense: 60 tablet; Refill: 3 Contnue- QUEtiapine (SEROQUEL) 100 MG tablet; Take 1 tablet (100 mg total) by mouth at bedtime.  Dispense: 30 tablet; Refill: 3      Follow-up in  3months Follow-up therapy Shanna Cisco, NP 03/21/2023, 9:22 AM

## 2023-03-24 ENCOUNTER — Other Ambulatory Visit: Payer: Self-pay

## 2023-03-25 ENCOUNTER — Ambulatory Visit: Payer: MEDICAID | Admitting: Obstetrics and Gynecology

## 2023-03-25 ENCOUNTER — Encounter: Payer: Self-pay | Admitting: Obstetrics and Gynecology

## 2023-03-25 ENCOUNTER — Other Ambulatory Visit: Payer: Self-pay

## 2023-03-25 VITALS — Wt 302.4 lb

## 2023-03-25 DIAGNOSIS — N939 Abnormal uterine and vaginal bleeding, unspecified: Secondary | ICD-10-CM

## 2023-03-25 DIAGNOSIS — N938 Other specified abnormal uterine and vaginal bleeding: Secondary | ICD-10-CM

## 2023-03-25 DIAGNOSIS — Z1331 Encounter for screening for depression: Secondary | ICD-10-CM

## 2023-03-25 NOTE — Progress Notes (Unsigned)
 GYNECOLOGY VISIT  Patient name: Kristen [name] MRN [id number]  Date of birth: 1985-03-20 Chief Complaint:   Gynecologic Exam   History:  Kristen [name] is a 38 y.o. G0P0 being seen today for concern for malpositioned IUD.    Was having bleedin prior to IUD insertion and had continuous bleeding until January. Had been wearing 2 pads + depends due to heavieness of flow. Had feeling of foreign object. She was passing large clots and no longer has the feeling that the iUD is in place and worried it is gone. If it's in there, can leave it there if in place. If not in there does not want another one placed.   January was rough bleeding, absolutely not wanting to have children  IUD was placed for bleeding concerns. Once bled for 2 years stragith. When she bleeds, it's very heavy. Has prviously had a hit speriod not as bad. Been to ED due heaviness of bleeding pain. Having to use a lot of menstrual product. Occ pain b/n menses. Occasional pain w/ intercourse. Feels weak floor. Will have shaking w/ heavy bleeding & spasm. Will have vaginal dryness and no lubrication and sex and tampon will cause pain.  Hx of migraine w/ aura though not every time Hx of HTN  Past Medical History:  Diagnosis Date   Anxiety 2013   treated at [facility] by Deatra Robinson    Asthma 1989   no hospitalization, or intubation, previously on advair and singulair. asthma worsening.  Follows w/ Dr. Jonah Blue @ [place]. Per pt on 01/17/23, she only uses inhaler if she is doing strenous exercise or during the change of seasons.   Chronic diarrhea    Follows w/ [facility].   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   Dysfunctional uterine bleeding    Dysrhythmia 2010   PVC's, palpitations   Ehlers-Danlos syndrome    Follows with Dr. Burnard Leigh, Sports Medicine and PCP.   Essential hypertension 03/12/2019    Follows w/ [facility].   GERD (gastroesophageal reflux disease)    History of hiatal hernia    History of suicidal ideation    IBS (irritable bowel syndrome)    Insulin resistance    Follows w/ endocrinology.   Kyphosis of thoracic region 2004   painful, runs in dad side    Lymphocytic hypophysitis (HCC)    Follows w/ endocrinology.   Migraine    Follows w/ PCP.   Orthostatic hypotension    Follows w/ [facility], Eligha Bridegroom, NP.   Pancreatitis due to biliary obstruction 2003   PCOS (polycystic ovarian syndrome) 2014   facial hair, irregular periods, never been pregnant    PTSD (post-traumatic stress disorder)    Follows w/ Behavioral Health.   Sleep apnea    not currently wearing Cpap   Social phobia    Follows w/ Behavioral Health.    Past Surgical History:  Procedure Laterality Date   BIOPSY  06/11/2021   Procedure: BIOPSY;  Surgeon: Napoleon Form, MD;  [facility];  Service: Gastroenterology;;   BIOPSY  02/25/2023   Procedure: BIOPSY;  Surgeon: Napoleon Form, MD;  [facility];  Service: Gastroenterology;;   BLADDER SURGERY     bladder stretched as a child   CHOLECYSTECTOMY  2003   COLONOSCOPY WITH PROPOFOL N/A 06/11/2021  Procedure: COLONOSCOPY WITH PROPOFOL;  Surgeon: Napoleon Form, MD;  [facility];  Service: Gastroenterology;  Laterality: N/A;   DILATATION & CURETTAGE/HYSTEROSCOPY WITH MYOSURE N/A 12/14/2019   Procedure: DILATATION & CURETTAGE/HYSTEROSCOPY WITH Polypectomy with MYOSURE;  Surgeon: Tereso Newcomer, MD;  [facility];  Service: Gynecology;  Laterality: N/A;   ESOPHAGOGASTRODUODENOSCOPY (EGD) WITH PROPOFOL N/A 02/25/2023   Procedure: ESOPHAGOGASTRODUODENOSCOPY (EGD) WITH PROPOFOL;  Surgeon: Napoleon Form, MD;  [facility];  Service: Gastroenterology;  Laterality: N/A;   INTRAUTERINE DEVICE (IUD) INSERTION N/A 01/21/2023   Procedure: INTRAUTERINE DEVICE  (IUD) INSERTION(MIRENA);  Surgeon: Adam Phenix, MD;  Location: [facility];  Service: Gynecology;  Laterality: N/A;   MOHS SURGERY Left 11/2022   Right side and left leg   OPEN REDUCTION INTERNAL FIXATION (ORIF) PROXIMAL PHALANX Right 07/23/2013   Procedure: OPEN TREATMENT RIGHT RING FINGER, PROXIMAL INTERPHALANGEAL/JOINT FRACTURE DISLOCATION;  Surgeon: Jodi Marble, MD;  Location: [facility];  Service: Orthopedics;  Laterality: Right;   TONSILLECTOMY     and adenoidectomy    The following portions of the patient's history were reviewed and updated as appropriate: allergies, current medications, past family history, past medical history, past social history, past surgical history and problem list.   Health Maintenance:   Last pap     Component Value Date/Time   DIAGPAP  03/10/2019 1525    - Negative for intraepithelial lesion or malignancy (NILM)   HPVHIGH Negative 03/10/2019 1525   ADEQPAP  03/10/2019 1525    Satisfactory for evaluation; transformation zone component PRESENT.    High Risk HPV: Positive  Adequacy:  Satisfactory for evaluation, transformation zone component PRESENT  Diagnosis:  Atypical squamous cells of undetermined significance (ASC-US)  Last mammogram: n/a   Review of Systems:  Pertinent items are noted in HPI. Comprehensive review of systems was otherwise negative.   Objective:  Physical Exam Wt (!) 302 lb 6.4 oz (137.2 kg)   LMP 03/18/2023 (Approximate)   BMI 48.81 kg/m    Physical Exam   Labs and Imaging No results found.     Assessment & Plan:   There are no diagnoses linked to this encounter.   *** Routine preventative health maintenance measures emphasized.  Lorriane Shire, MD Minimally Invasive Gynecologic Surgery Center for [facility] Healthcare, [facility]

## 2023-03-25 NOTE — Patient Instructions (Signed)
 Total laparoscopic hysterectomy, bilateral salpingectomy and cystoscopy is the surgery you will be scheduled for

## 2023-04-02 ENCOUNTER — Encounter: Payer: Self-pay | Admitting: Neurology

## 2023-04-02 NOTE — Progress Notes (Signed)
 See procedure note.

## 2023-04-02 NOTE — Procedures (Signed)
 [facility]  HOME SLEEP TEST (SANSA) REPORT (Mail-Out Device):   STUDY DATE: 03/20/23  DOB: [birth year]  MRN: [id number]  ORDERING CLINICIAN: True Mar, MD, PhD   REFERRING CLINICIAN: Vicci Barnie NOVAK, MD   CLINICAL INFORMATION/HISTORY: 38 year old female with an underlying medical history of hypertension, Ehlers-Danlos syndrome, Raynaud's syndrome, PCOS, IBS, reflux disease, migraine headaches (followed by Dr. Ines and Greig Forbes, NP in the past), asthma, anxiety, depression, and morbid obesity with a BMI of over 50, who reports snoring and excessive daytime somnolence.   PATIENT'S LAST REPORTED EPWORTH SLEEPINESS SCORE (ESS): 11/24.  BMI (at the time of sleep clinic visit and/or test date): 50.4 kg/m  FINDINGS:   Study Protocol:    The SANSA single-point-of-skin-contact chest-worn sensor - an FDA cleared and DOT approved type 4 home sleep test device - measures eight physiological channels,  including blood oxygen saturation (measured via PPG [photoplethysmography]), EKG-derived heart rate, respiratory effort, chest movement (measured via accelerometer), snoring, body position, and actigraphy. The device is designed to be worn for up to 10 hours per study.   Sleep Summary:   Total Recording Time (hours, min): 9 hours, 57 min  Total Effective Sleep Time (hours, min):  6 hours, 58 min  Sleep Efficiency (%):    70%   Respiratory Indices:   Calculated sAHI (per hour):  3.2/hour         Oxygen Saturation Statistics:    Oxygen Saturation (%) Mean: 96.5%   Minimum oxygen saturation (%):                 90.1%   O2 Saturation Range (%): 90.1- 100%   Time below or at 88% saturation: 0 min   Pulse Rate Statistics:   Pulse Mean (bpm):    77/min    Pulse Range (58 - 128/min)   Snoring: Intermittent, mild to moderate  IMPRESSION/DIAGNOSES:   Primary snoring    RECOMMENDATIONS:   This home sleep test does not demonstrate any significant  obstructive or central sleep disordered breathing with a total AHI of less than 5/hour. Her total AHI was 3.2/hour, O2 nadir of 90.1%.  Snoring was detected, and appeared to be intermittent, in the mild to moderate range. Treatment with a positive airway pressure device such as AutoPap or CPAP is not indicated based on this test. Snoring may improve with avoidance of the supine sleep position and weight loss (where clinically appropriate).  For disturbing snoring, an oral appliance through dentistry or orthodontics can be considered.  Other causes of the patient's symptoms, including circadian rhythm disturbances, an underlying mood disorder, medication effect and/or an underlying medical problem cannot be ruled out based on this test. Clinical correlation is recommended.  The patient should be cautioned not to drive, work at heights, or operate dangerous or heavy equipment when tired or sleepy. Review and reiteration of good sleep hygiene measures should be pursued with any patient. The patient will be advised to follow up with her referring provider, who will be notified of the test results.   I certify that I have reviewed the raw data recording prior to the issuance of this report in accordance with the standards of the American Academy of Sleep Medicine (AASM).  INTERPRETING PHYSICIAN:   True Mar, MD, PhD Medical Director, [facility]) Diplomat, ABPN (Neurology and Sleep)   [facility] [address] [place], [place] [address] [phone]

## 2023-04-11 ENCOUNTER — Encounter (HOSPITAL_COMMUNITY): Payer: Self-pay

## 2023-04-11 ENCOUNTER — Ambulatory Visit (HOSPITAL_COMMUNITY): Payer: MEDICAID | Admitting: Clinical

## 2023-04-11 DIAGNOSIS — F331 Major depressive disorder, recurrent, moderate: Secondary | ICD-10-CM

## 2023-04-11 NOTE — Progress Notes (Signed)
 THERAPIST PROGRESS NOTE Virtual Visit via Video Note  I connected with Kristen [name] on 04/11/23 at 10:00 AM EST by a video enabled telemedicine application and verified that I am speaking with the correct person using two identifiers.  Location: Patient: home Provider: office   I discussed the limitations of evaluation and management by telemedicine and the availability of in person appointments. The patient expressed understanding and agreed to proceed.   Follow Up Instructions: I discussed the assessment and treatment plan with the patient. The patient was provided an opportunity to ask questions and all were answered. The patient agreed with the plan and demonstrated an understanding of the instructions.   The patient was advised to call back or seek an in-person evaluation if the symptoms worsen or if the condition fails to improve as anticipated.   Session Time: 25 minutes  Participation Level: Active  Behavioral Response: CasualAlertDepressed  Type of Therapy: Individual Therapy  Treatment Goals addressed:  Therapist will utilize solution-focused and CBT interventions to assist Kristen [name] in identifying at least 1 -2 thoughts and behaviors contributing to her feelings of anxiety and depression.   ProgressTowards Goals: Progressing  Interventions: CBT  Summary:  Kristen [name] is a 37 y.o. female who presents for the scheduled appointment oriented x 5, appropriately dressed, and friendly.  Client denied hallucinations and delusions. Client reported on today she has been doing fairly okay but feeling tired.  Client reported that household is in a financial strain currently.  Client reported her food stamps were lowered so she is trying to figure out how to make groceries stretch.  Client reported it has been difficult as she has been trying to watch what she eats due to gastrointestinal issues with certain foods.  Client reported she also has been having a  hard time getting caught up.  Client reported after getting over COVID at the end of last year she feels like her energy levels are not back to normal.  Client reported her psychiatric medications are working well.  Client reported she does have good news that she was able to receive financial assistance to get a gym membership at the [facility].  Client reported she has plans to try to go a few times a week.  Client reported she has also been working on finding some things that she can do around the house to help distract her thoughts from focusing on the negative. Evidence of progress towards goal: Client reported 1 positive of obtaining a gym membership to help her get out of the house at least 2 days out of the week.  Suicidal/Homicidal: Nowithout intent/plan  Therapist Response:  Therapist began the appointment asking the client how she has been doing since last seen. Therapist used CBT to engage with active listening and positive emotional support. Therapist used CBT to address the client about changes that have occurred since she was last seen. Therapist used CBT to have the client identify negative thought patterns and engage with her to reinforce practicing mindfulness skills such as gratitude. Therapist used CBT to reinforce the clients desire to be active outside the home which she can. Therapist used CBT ask the client to identify her progress with frequency of use with coping skills with continued practice in her daily activity.    Therapist assigned client homework to practice self-care.   Plan: Return again in 4 weeks.  Diagnosis: Moderate episode of recurrent major depressive disorder  Collaboration of Care: Patient refused AEB none requested by the  client.  Patient/Guardian was advised Release of Information must be obtained prior to any record release in order to collaborate their care with an outside provider. Patient/Guardian was advised if they have not already done so to contact  the registration department to sign all necessary forms in order for [place] to release information regarding their care.   Consent: Patient/Guardian gives verbal consent for treatment and assignment of benefits for services provided during this visit. Patient/Guardian expressed understanding and agreed to proceed.   Kristen Rhymes Ayahna Solazzo, LCSW 04/11/2023

## 2023-04-14 NOTE — Progress Notes (Unsigned)
 04/15/2023 Kristen [name] [id number] October 20, 1985  Referring provider: Marcine Matar, MD Primary GI doctor: Dr. Lavon Paganini  ASSESSMENT AND PLAN:   Gastroesophageal Reflux Disease (GERD) with LA grade C esophagitis, dysphagia, continuing epigastric pain with food Will get barium swallow to evaluate for dysmotility Will repeat EGD with dilation to evaluate for healing and possible dilation with ongoing dysphagia Continue omeprazole 40 mg Consider GES with history of POTS Lifestyle changes discussed, avoid NSAIDS, ETOH, hand out given to the patient Weight loss discussed with the patient, continue efforts, she has gotten below BMI 50  Chronic Diarrhea (likely Bile Acid Diarrhea) History of cholecystectomy with chronic diarrhea, 6-8 bowel movements per day with nocturnal symptoms. Previous tests including colonoscopy, celiac, fecal calprotectin, and pancreatic elastase were negative.Trial of xiphaxin Patient has been prescribed Questran but has poor compliance due to texture intolerance. -colestipol pills 1-3 tablets a day, improved - avoiding lactulose - add on Fiber daily   Morbid obesity  Body mass index is 48.74 kg/m.  - can be at [facility] -Patient has been advised to make an attempt to improve diet and exercise patterns to aid in weight loss. -Recommended diet heavy in fruits and veggies and low in animal meats, cheeses, and dairy products, appropriate calorie intake    Patient Care Team: Marcine Matar, MD as PCP - General (Internal Medicine) Meriam Sprague, MD (Inactive) as PCP - Cardiology (Cardiology)  HISTORY OF PRESENT ILLNESS: 38 y.o. female with a past medical history of GERD, POTS, Ehlers Danlos syndrome, obesity, depression with PTSD, OSA on CPAP, status post cholecystectomy and others listed below presents for evaluation of chronic diarrhea.   06/11/2021 colonoscopy to rule out IBD/microscopic colitis for chronic diarrhea showed normal, random  biopsies did show some mild edema and a few lamina propria neutrophils but negative for microscopic colitis and no chronicity of features Patient was last seen 07/11/2021 with [name] with diarrhea 6-12 bowel movements a day on Questran for bile acid diarrhea, Imodium hours postprandial with cramping.  Previously is negative celiac and fecal calprotectin. Patient had pancreases elastase was negative She had xifaxin trial in 10/09/2022 with some help.   Discussed the use of AI scribe software for clinical note transcription with the patient, who gave verbal consent to proceed.  History of Present Illness   The patient is a 38 year old with esophagitis and IBS who presents with gastrointestinal symptoms and swallowing difficulties.  She experiences difficulty swallowing, with episodes where food or even water 'hits the back of my throat wrong,' leading to coughing fits. This issue has worsened since she turned 32 and occurs almost every time she eats. She notes a family history of similar issues, attributing it to 'small mouths and small throats.'  She has a history of grade C esophagitis and a 2 cm hiatal hernia. She is currently on 40 mg of omeprazole daily, which has improved her symptoms by 70 to 85%. She experiences occasional stomach discomfort, bloating, and nausea, particularly after eating. Symptoms are exacerbated by acid reflux, extreme temperatures of food, and gas. She also reports feeling full easily and sometimes experiences chest spasms with hot or cold foods.  She reports improvement in bowel movements with the use of colestipol and omeprazole, now experiencing 2 to 5 bowel movements per day, down from double digits. Stools vary between diarrhea and formed, and she has reduced dairy, spicy foods, and greasy foods from her diet, which she finds challenging due to her [place] background.  She has a history of Ehlers-Danlos Syndrome (EDS) and Postural Orthostatic Tachycardia Syndrome (POTS),  which may contribute to her symptoms. She has tried Xifaxan in the past without relief.  She does not consume alcohol, smoke, or use drugs, and only takes NSAIDs rarely for headaches.      She  reports that she has never smoked. She has been exposed to tobacco smoke. She has never used smokeless tobacco. She reports that she does not drink alcohol and does not use drugs.  RELEVANT LABS AND IMAGING: EGD 02/25/2023- LA Grade C reflux esophagitis with no bleeding. Biopsied. - Esophageal plaques were found, suspicious for candidiasis. Biopsied. - 2 cm hiatal hernia. - Gastritis. Biopsied. - Duodenal erosions without bleeding. Biopsied. - Normal second portion of the duodenum. A. DUODENAL BIOPSY:  Acute duodenitis with gastric metaplasia consistent with peptic  duodenitis   B. GASTRIC ANTRUM AND BODY, BIOPSY:  Reactive gastropathy and mild chronic gastritis with lymphoid aggregates  Negative for H. pylori, intestinal metaplasia, dysplasia and carcinoma   C. ESOPHAGEAL BIOPSY:  Eosinophil-rich esophagitis (see comment)  Mild chronic, focally active gastritis  Negative for intestinal metaplasia, dysplasia and carcinoma    CBC    Component Value Date/Time   WBC 7.7 02/09/2023 1857   RBC 4.18 02/09/2023 1857   HGB 12.1 02/09/2023 1857   HGB 13.3 06/27/2022 1434   HCT 36.7 02/09/2023 1857   HCT 39.9 06/27/2022 1434   PLT 212 02/09/2023 1857   PLT 278 06/27/2022 1434   MCV 87.8 02/09/2023 1857   MCV 85 06/27/2022 1434   MCH 28.9 02/09/2023 1857   MCHC 33.0 02/09/2023 1857   RDW 13.5 02/09/2023 1857   RDW 13.5 06/27/2022 1434   LYMPHSABS 2.6 02/09/2023 1857   LYMPHSABS 3.0 06/27/2022 1434   MONOABS 0.4 02/09/2023 1857   EOSABS 0.4 02/09/2023 1857   EOSABS 0.2 06/27/2022 1434   BASOSABS 0.0 02/09/2023 1857   BASOSABS 0.1 06/27/2022 1434   Recent Labs    06/27/22 1434 01/21/23 0751 01/21/23 0812 02/09/23 1857  HGB 13.3 13.9 12.9 12.1    CMP     Component Value Date/Time    NA 138 02/09/2023 1857   NA 141 07/14/2020 0937   K 3.6 02/09/2023 1857   CL 107 02/09/2023 1857   CO2 22 02/09/2023 1857   GLUCOSE 91 02/09/2023 1857   BUN 11 02/09/2023 1857   BUN 12 07/14/2020 0937   CREATININE 0.90 02/09/2023 1857   CREATININE 1.09 03/25/2014 1730   CALCIUM 8.7 (L) 02/09/2023 1857   PROT 6.6 02/09/2023 1857   PROT 6.4 07/22/2017 1339   ALBUMIN 3.5 02/09/2023 1857   ALBUMIN 4.2 07/22/2017 1339   AST 26 02/09/2023 1857   ALT 29 02/09/2023 1857   ALKPHOS 116 02/09/2023 1857   BILITOT 0.6 02/09/2023 1857   BILITOT 0.4 07/22/2017 1339   GFRNONAA >60 02/09/2023 1857   GFRNONAA 70 03/25/2014 1730   GFRAA >60 04/15/2019 0624   GFRAA 80 03/25/2014 1730      Latest Ref Rng & Units 02/09/2023    6:57 PM 06/18/2022   10:25 AM 01/07/2021    7:23 AM  Hepatic Function  Total Protein 6.5 - 8.1 g/dL 6.6  6.5  6.6   Albumin 3.5 - 5.0 g/dL 3.5  3.7  3.6   AST 15 - 41 U/L 26  14  21    ALT 0 - 44 U/L 29  13  20    Alk Phosphatase 38 - 126 U/L 116  110  111   Total Bilirubin 0.0 - 1.2 mg/dL 0.6  0.6  0.3       Current Medications:    Current Outpatient Medications (Cardiovascular):    colestipol (COLESTID) 1 g tablet, Take 2 tablets (2 g total) by mouth 3 (three) times daily.   metoprolol tartrate (LOPRESSOR) 25 MG tablet, Take 1 tablet (25 mg total) by mouth 2 (two) times daily.   propranolol (INDERAL) 10 MG tablet, Take 2 tablets (20 mg total) by mouth 3 (three) times daily as needed (palpitations). (Patient not taking: Reported on 04/15/2023)  Current Outpatient Medications (Respiratory):    albuterol (VENTOLIN HFA) 108 (90 Base) MCG/ACT inhaler, Inhale 2 puffs into the lungs every 6 (six) hours as needed for wheezing or shortness of breath.   ipratropium-albuterol (DUONEB) 0.5-2.5 (3) MG/3ML SOLN, Take 3 mLs by nebulization every 4 (four) hours as needed.    Current Outpatient Medications (Other):    Benzocaine-Resorcinol (VAGISIL EX), Apply 1 application.  topically as needed.   busPIRone (BUSPAR) 10 MG tablet, Take 1 tablet (10 mg total) by mouth 3 (three) times daily.   Calcium Carb-Cholecalciferol (CALCIUM 500 + D PO), Take 1 tablet by mouth daily at 6 (six) AM.   Calcium-Phosphorus-Vitamin D (CALCIUM/VITAMIN D3/ADULT GUMMY) 250-100-500 MG-MG-UNIT CHEW, Chew by mouth daily at 6 (six) AM.   diclofenac Sodium (VOLTAREN) 1 % GEL, Apply 2 g topically 4 (four) times daily. (Patient taking differently: Apply 2 g topically as needed.)   hydrOXYzine (ATARAX) 25 MG tablet, Take 1 tablet (25 mg total) by mouth 4 (four) times daily.   loperamide (IMODIUM A-D) 2 MG tablet, Take 2 mg by mouth 4 (four) times daily as needed for diarrhea or loose stools.   omeprazole (PRILOSEC) 40 MG capsule, Take 1 capsule (40 mg total) by mouth daily before breakfast.   QUEtiapine (SEROQUEL) 100 MG tablet, Take 1 tablet (100 mg total) by mouth at bedtime.   sertraline (ZOLOFT) 100 MG tablet, Take 2 tablets (200 mg total) by mouth at bedtime.   traZODone (DESYREL) 100 MG tablet, Take 1 tablet (100 mg total) by mouth at bedtime as needed for sleep.   gabapentin (NEURONTIN) 300 MG capsule, TAKE 1 CAPSULE (300 MG TOTAL) BY MOUTH AT BEDTIME. (Patient taking differently: Take 300 mg by mouth as needed (pain).)  Medical History:  Past Medical History:  Diagnosis Date   Anxiety 2013   treated at [facility] by Deatra Robinson    Asthma 1989   no hospitalization, or intubation, previously on advair and singulair. asthma worsening.  Follows w/ Dr. Jonah Blue @ [facility]. Per pt on 01/17/23, she only uses inhaler if she is doing strenous exercise or during the change of seasons.   Chronic diarrhea    Follows w/ [facility].   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   Dysfunctional uterine bleeding    Dysrhythmia 2010   PVC's, palpitations   Ehlers-Danlos syndrome    Follows with Dr.  Burnard Leigh, Sports Medicine and PCP.   Essential hypertension 03/12/2019   Follows w/ [facility] cardiology.   GERD (gastroesophageal reflux disease)    History of hiatal hernia    History of suicidal ideation    IBS (irritable bowel syndrome)    Insulin resistance    Follows w/ endocrinology.   Kyphosis of thoracic region 2004   painful, runs in dad  side    Lymphocytic hypophysitis (HCC)    Follows w/ endocrinology.   Migraine    Follows w/ PCP.   Orthostatic hypotension    Follows w/ [facility], Eligha Bridegroom, NP.   Pancreatitis due to biliary obstruction 2003   PCOS (polycystic ovarian syndrome) 2014   facial hair, irregular periods, never been pregnant    PTSD (post-traumatic stress disorder)    Follows w/ Behavioral Health.   Sleep apnea    not currently wearing Cpap   Social phobia    Follows w/ Behavioral Health.   Allergies:  Allergies  Allergen Reactions   Latex Hives and Swelling   Mucinex [Guaifenesin Er] Hives, Itching and Swelling   Other Anaphylaxis and Nausea Only    Pickles - throat swelling and nausea    Penicillins Swelling    Did it involve swelling of the face/tongue/throat, SOB, or low BP? Yes Did it involve sudden or severe rash/hives, skin peeling, or any reaction on the inside of your mouth or nose? No Did you need to seek medical attention at a hospital or doctor's office? Yes When did it last happen?      >10 years ago If all above answers are "NO", may proceed with cephalosporin use.    Contrast Media [Iodinated Contrast Media] Nausea And Vomiting   Multihance [Gadobenate] Nausea And Vomiting     Surgical History:  She  has a past surgical history that includes Open reduction internal fixation (orif) proximal phalanx (Right, 07/23/2013); Cholecystectomy (2003); Bladder surgery; Tonsillectomy; Dilatation & curettage/hysteroscopy with myosure (N/A, 12/14/2019); Colonoscopy with propofol (N/A, 06/11/2021); biopsy (06/11/2021); Mohs  surgery (Left, 11/2022); Intrauterine device (iud) insertion (N/A, 01/21/2023); Esophagogastroduodenoscopy (egd) with propofol (N/A, 02/25/2023); and biopsy (02/25/2023). Family History:  Her family history includes ADD / ADHD in her cousin; Alcohol abuse in her maternal aunt, maternal grandfather, paternal aunt, and paternal grandfather; Alzheimer's disease in her maternal grandmother; Anxiety disorder in her mother; Arnold-Chiari malformation in her mother; Arthritis in her mother; Asthma in her mother; Bipolar disorder in her father; Bone cancer (age of onset: 68) in her maternal grandfather; COPD in her father and mother; Cancer in her paternal grandmother; Colonic polyp in her mother; Congestive Heart Failure in her father; Dementia in her maternal grandmother and paternal grandfather; Depression in her father, maternal aunt, and paternal aunt; Diabetes in her maternal grandmother and paternal grandfather; Drug abuse in her maternal grandfather and paternal grandfather; Eczema in her mother; Hypertension in her father, maternal grandmother, and mother; Multiple myeloma in her maternal grandfather; OCD in her maternal grandmother and mother; Osteoporosis in her mother; Pancreatic cancer in her maternal aunt; Peripheral Artery Disease in her mother; Restless legs syndrome in her mother; Scoliosis in her father; Squamous cell carcinoma in her mother; Stroke in her mother.  REVIEW OF SYSTEMS  : All other systems reviewed and negative except where noted in the History of Present Illness.  PHYSICAL EXAM: BP (!) 140/100   Pulse 77   Ht 5\' 6"  (1.676 m)   Wt (!) 302 lb (137 kg)   LMP 04/15/2023   SpO2 99%   BMI 48.74 kg/m  General Appearance: Well nourished, in no apparent distress. Head:   Normocephalic and atraumatic. Eyes:  sclerae anicteric,conjunctive pink  Respiratory: Respiratory effort normal, BS equal bilaterally without rales, rhonchi, wheezing. Cardio: RRR with no MRGs. Peripheral pulses  intact.  Abdomen: Soft,  Obese ,active bowel sounds. mild tenderness in the epigastrium. Without guarding and Without rebound. No masses.  Rectal: Not evaluated Musculoskeletal: Full ROM, Normal gait. Without edema. Skin:  Dry and intact without significant lesions or rashes Neuro: Alert and  oriented x4;  No focal deficits. Psych:  Cooperative. Normal mood and affect.    Doree Albee, PA-C 10:45 AM

## 2023-04-15 ENCOUNTER — Ambulatory Visit (INDEPENDENT_AMBULATORY_CARE_PROVIDER_SITE_OTHER): Payer: MEDICAID | Admitting: Physician Assistant

## 2023-04-15 ENCOUNTER — Encounter: Payer: Self-pay | Admitting: Physician Assistant

## 2023-04-15 VITALS — BP 128/98 | HR 77 | Ht 66.0 in | Wt 302.0 lb

## 2023-04-15 DIAGNOSIS — K580 Irritable bowel syndrome with diarrhea: Secondary | ICD-10-CM

## 2023-04-15 DIAGNOSIS — K219 Gastro-esophageal reflux disease without esophagitis: Secondary | ICD-10-CM | POA: Diagnosis not present

## 2023-04-15 DIAGNOSIS — K529 Noninfective gastroenteritis and colitis, unspecified: Secondary | ICD-10-CM | POA: Diagnosis not present

## 2023-04-15 DIAGNOSIS — Z6841 Body Mass Index (BMI) 40.0 and over, adult: Secondary | ICD-10-CM

## 2023-04-15 DIAGNOSIS — R1319 Other dysphagia: Secondary | ICD-10-CM

## 2023-04-15 DIAGNOSIS — G90A Postural orthostatic tachycardia syndrome (POTS): Secondary | ICD-10-CM

## 2023-04-15 DIAGNOSIS — R1013 Epigastric pain: Secondary | ICD-10-CM

## 2023-04-15 DIAGNOSIS — R109 Unspecified abdominal pain: Secondary | ICD-10-CM

## 2023-04-15 NOTE — Patient Instructions (Signed)
 You have been scheduled for an endoscopy. Please follow written instructions given to you at your visit today.  If you use inhalers (even only as needed), please bring them with you on the day of your procedure.  If you take any of the following medications, they will need to be adjusted prior to your procedure:   DO NOT TAKE 7 DAYS PRIOR TO TEST- Trulicity (dulaglutide) Ozempic, Wegovy (semaglutide) Mounjaro (tirzepatide) Bydureon Bcise (exanatide extended release)  DO NOT TAKE 1 DAY PRIOR TO YOUR TEST Rybelsus (semaglutide) Adlyxin (lixisenatide) Victoza (liraglutide) Byetta (exanatide) __________________________________________________________________________ You have been scheduled for a Barium Esophogram at [facility] (1st floor of the hospital) on 04/24/2023 at 11:00AM. Please arrive 30 minutes prior to your appointment for registration. Make certain not to have anything to eat or drink 3 hours prior to your test. If you need to reschedule for any reason, please contact radiology at [phone] to do so. __________________________________________________________________ A barium swallow is an examination that concentrates on views of the esophagus. This tends to be a double contrast exam (barium and two liquids which, when combined, create a gas to distend the wall of the oesophagus) or single contrast (non-ionic iodine based). The study is usually tailored to your symptoms so a good history is essential. Attention is paid during the study to the form, structure and configuration of the esophagus, looking for functional disorders (such as aspiration, dysphagia, achalasia, motility and reflux) EXAMINATION You may be asked to change into a gown, depending on the type of swallow being performed. A radiologist and radiographer will perform the procedure. The radiologist will advise you of the type of contrast selected for your procedure and direct you during the exam. You will be  asked to stand, sit or lie in several different positions and to hold a small amount of fluid in your mouth before being asked to swallow while the imaging is performed .In some instances you may be asked to swallow barium coated marshmallows to assess the motility of a solid food bolus. The exam can be recorded as a digital or video fluoroscopy procedure. POST PROCEDURE It will take 1-2 days for the barium to pass through your system. To facilitate this, it is important, unless otherwise directed, to increase your fluids for the next 24-48hrs and to resume your normal diet.  This test typically takes about 30 minutes to perform. __________________________________________________________________________________    _______________________________________________________  If your blood pressure at your visit was 140/90 or greater, please contact your primary care physician to follow up on this.  _______________________________________________________  If you are age 16 or older, your body mass index should be between 23-30. Your Body mass index is 48.74 kg/m. If this is out of the aforementioned range listed, please consider follow up with your Primary Care Provider.  If you are age 80 or younger, your body mass index should be between 19-25. Your Body mass index is 48.74 kg/m. If this is out of the aformentioned range listed, please consider follow up with your Primary Care Provider.   ________________________________________________________  The [facility] providers would like to encourage you to use [facility] to communicate with providers for non-urgent requests or questions.  Due to long hold times on the telephone, sending your provider a message by [facility] may be a faster and more efficient way to get a response.  Please allow 48 business hours for a response.  Please remember that this is for non-urgent requests.  _______________________________________________________

## 2023-04-24 ENCOUNTER — Ambulatory Visit (HOSPITAL_COMMUNITY): Payer: MEDICAID

## 2023-04-30 ENCOUNTER — Telehealth: Payer: Self-pay | Admitting: Internal Medicine

## 2023-04-30 ENCOUNTER — Other Ambulatory Visit: Payer: Self-pay | Admitting: Internal Medicine

## 2023-04-30 DIAGNOSIS — S29012A Strain of muscle and tendon of back wall of thorax, initial encounter: Secondary | ICD-10-CM

## 2023-04-30 DIAGNOSIS — F419 Anxiety disorder, unspecified: Secondary | ICD-10-CM

## 2023-04-30 DIAGNOSIS — J4520 Mild intermittent asthma, uncomplicated: Secondary | ICD-10-CM

## 2023-04-30 DIAGNOSIS — S161XXA Strain of muscle, fascia and tendon at neck level, initial encounter: Secondary | ICD-10-CM

## 2023-04-30 DIAGNOSIS — I10 Essential (primary) hypertension: Secondary | ICD-10-CM

## 2023-04-30 MED ORDER — ALBUTEROL SULFATE HFA 108 (90 BASE) MCG/ACT IN AERS: 2.0000 | INHALATION_SPRAY | Freq: Four times a day (QID) | RESPIRATORY_TRACT | 6 refills | Status: AC | PRN

## 2023-04-30 MED ORDER — PROPRANOLOL HCL 10 MG PO TABS
20.0000 mg | ORAL_TABLET | Freq: Three times a day (TID) | ORAL | 11 refills | Status: AC | PRN
Start: 2023-04-30 — End: ?

## 2023-04-30 MED ORDER — METOPROLOL TARTRATE 25 MG PO TABS: 25.0000 mg | ORAL_TABLET | Freq: Two times a day (BID) | ORAL | 1 refills | Status: DC

## 2023-04-30 MED ORDER — DICLOFENAC SODIUM 1 % EX GEL: 2.0000 g | Freq: Four times a day (QID) | CUTANEOUS | 2 refills | Status: DC

## 2023-04-30 NOTE — Telephone Encounter (Signed)
 Copied from CRM [id number]. Topic: Clinical - Medication Question >> Apr 30, 2023  9:17 AM [name] wrote: Reason for CRM:  [employer] needs a list of Kristen [name]'s medications with doctor's signature. Also, how to give medications. [employer] faxed a request to office last week. Please fax information to [phone]

## 2023-05-01 ENCOUNTER — Ambulatory Visit: Payer: Self-pay | Attending: Internal Medicine | Admitting: Internal Medicine

## 2023-05-01 ENCOUNTER — Other Ambulatory Visit: Payer: Self-pay

## 2023-05-01 ENCOUNTER — Encounter: Payer: Self-pay | Admitting: Internal Medicine

## 2023-05-01 VITALS — BP 118/78 | HR 57 | Temp 98.0°F | Ht 66.0 in | Wt 302.0 lb

## 2023-05-01 DIAGNOSIS — Z79899 Other long term (current) drug therapy: Secondary | ICD-10-CM | POA: Insufficient documentation

## 2023-05-01 DIAGNOSIS — G2581 Restless legs syndrome: Secondary | ICD-10-CM

## 2023-05-01 DIAGNOSIS — L304 Erythema intertrigo: Secondary | ICD-10-CM | POA: Diagnosis not present

## 2023-05-01 DIAGNOSIS — E282 Polycystic ovarian syndrome: Secondary | ICD-10-CM | POA: Insufficient documentation

## 2023-05-01 DIAGNOSIS — Z23 Encounter for immunization: Secondary | ICD-10-CM | POA: Diagnosis not present

## 2023-05-01 DIAGNOSIS — J4520 Mild intermittent asthma, uncomplicated: Secondary | ICD-10-CM

## 2023-05-01 DIAGNOSIS — I10 Essential (primary) hypertension: Secondary | ICD-10-CM | POA: Diagnosis not present

## 2023-05-01 DIAGNOSIS — E65 Localized adiposity: Secondary | ICD-10-CM

## 2023-05-01 DIAGNOSIS — J4550 Severe persistent asthma, uncomplicated: Secondary | ICD-10-CM | POA: Insufficient documentation

## 2023-05-01 DIAGNOSIS — Z6841 Body Mass Index (BMI) 40.0 and over, adult: Secondary | ICD-10-CM | POA: Insufficient documentation

## 2023-05-01 DIAGNOSIS — Z681 Body mass index (BMI) 19 or less, adult: Secondary | ICD-10-CM | POA: Insufficient documentation

## 2023-05-01 DIAGNOSIS — G43109 Migraine with aura, not intractable, without status migrainosus: Secondary | ICD-10-CM | POA: Diagnosis not present

## 2023-05-01 MED ORDER — NYSTATIN 100000 UNIT/GM EX POWD
1.0000 | Freq: Two times a day (BID) | CUTANEOUS | 4 refills | Status: AC
  Filled 2023-05-01 – 2023-05-19 (×2): qty 15, 8d supply, fill #0

## 2023-05-01 MED ORDER — GABAPENTIN 300 MG PO CAPS
300.0000 mg | ORAL_CAPSULE | Freq: Every day | ORAL | 3 refills | Status: AC
Start: 2023-05-01 — End: ?
  Filled 2023-05-01: qty 30, 30d supply, fill #0

## 2023-05-01 MED ORDER — SUMATRIPTAN SUCCINATE 50 MG PO TABS
ORAL_TABLET | ORAL | 3 refills | Status: AC
  Filled 2023-05-01: qty 10, 30d supply, fill #0
  Filled 2023-05-19: qty 9, 30d supply, fill #0
  Filled 2023-08-15: qty 9, 30d supply, fill #1
  Filled 2024-01-14: qty 9, 30d supply, fill #2

## 2023-05-01 MED ORDER — IPRATROPIUM-ALBUTEROL 0.5-2.5 (3) MG/3ML IN SOLN
3.0000 mL | Freq: Four times a day (QID) | RESPIRATORY_TRACT | 1 refills | Status: DC | PRN
  Filled 2023-05-01 – 2023-05-19 (×2): qty 90, 8d supply, fill #0

## 2023-05-01 NOTE — Progress Notes (Signed)
 Patient ID: Kristen [name], female    DOB: 1985/12/26  MRN: [id number]  CC: Hypertension (HTN f/u. Med refills/Requesting referral to get a panniculectomy - rash, sweaty, odor, tearing of skin on lower abdominal area /Requesting pulmonologist referral (Dr.AUD) Herold Harms meds for migraines)   Subjective: Kristen [name] is a 38 y.o. female who presents for chronic ds management. Her concerns today include:  Pt with hx ofanx/dep, IBS,  PCOS, morbid obesity, Dep, RLS, social phobia, lymphocytic hypophysitis, hyperprolactinemia, severe persistent asthma, migraines, OSA (thought study done 03/2023 by neurology revealed no OSA) CPAP, RLS, Elhers Danlo.   Discussed the use of AI scribe software for clinical note transcription with the patient, who gave verbal consent to proceed.  History of Present Illness   Kristen [name] is a 37 year old female with migraines and restless leg syndrome who presents for a four-month follow-up visit.  Migraines: Patient is requesting new medication to use for abortive treatment of migraines.  She previously used Aleve for migraines but was advised to avoid NSAIDs due to a hernia and other gastrointestinal concerns. She is considering alternative treatments now that she has an IUD. -migraines occur about 3-4x/mth, often triggered by stress, changes in barometric pressure, or extreme heat. Symptoms include photophobia, nausea, and auras described as 'colorful orbs.'  RLS: request refills for gabapentin 300 mg which she takes at bedtime for restless leg syndrome.  She has experienced significant weight loss, approximately 15 to 30 pounds, through diet and exercise, but struggles with an 'apron belly' causing skin irritation, odor, and occasional yeast infections under the abdominal fold. She uses over-the-counter cornstarch powder and antifungal creams like Monistat to manage symptoms, but these issues persist, leading to discomfort and hygiene concerns.   Would like to have panniculectomy done.  Asthma:  Request referral to her pulmonologist Dr. Vassie Loll  whom she has not seen in about 3 yrs. was seeing him for her asthma and OSA.  I note that recent sleep study done was done last month and read by neurologist Dr. Frances Furbish revealed no significant obstruction or central sleep disordered breathing.   Uses albuterol inhaler as needed but not every day and has a nebulizer machine; needs refill on nebulizer solution. She anticipates increased asthma symptoms with the upcoming allergy season and uses allergy medications like Claritin or Zyrtec as needed.      Patient Active Problem List   Diagnosis Date Noted   Melena 02/25/2023   Abdominal pain, chronic, epigastric 02/25/2023   Diarrhea    Chronic diarrhea    Weight gain 11/01/2020   Hirsutism 11/01/2020   Gastroesophageal reflux disease without esophagitis 07/11/2020   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   POTS (postural orthostatic tachycardia syndrome) 06/09/2020   Morbid obesity (HCC) 12/14/2019   Endometrial polyp    Encounter for autism screening 11/18/2019   DUB (dysfunctional uterine bleeding) 11/08/2019   PTSD (post-traumatic stress disorder) 09/20/2019   Irritable bowel syndrome with diarrhea 04/23/2019   Abnormal serum thyroid stimulating hormone (TSH) level 04/23/2019   OSA on CPAP 03/17/2019   Anxiety and depression 03/12/2019   Chronic migraine with aura 03/12/2019   Essential hypertension 03/12/2019   Mild intermittent asthma without complication 03/12/2019   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   Elevated prolactin level 07/05/2016   Vulvar lesion 07/05/2016   Restless legs 05/24/2016   Seasonal allergies 05/13/2014   Intertrigo 05/13/2014   Decreased vision 05/13/2014   PCOS (polycystic ovarian syndrome) 02/07/2014   Social phobia 02/02/2007   Attention deficit  disorder 02/02/2007   DYSLEXIA 02/02/2007   Idiopathic scoliosis and kyphoscoliosis 02/02/2007   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 18 g 6   Benzocaine-Resorcinol (VAGISIL EX) Apply 1 application. topically as needed.     busPIRone (BUSPAR) 10 MG tablet Take 1 tablet (10 mg total) by mouth 3 (three) times daily. 90 tablet 3   Calcium Carb-Cholecalciferol (CALCIUM 500 + D PO) Take 1 tablet by mouth daily at 6 (six) AM.     Calcium-Phosphorus-Vitamin D (CALCIUM/VITAMIN D3/ADULT GUMMY) 250-100-500 MG-MG-UNIT CHEW Chew by mouth daily at 6 (six) AM.     colestipol (COLESTID) 1 g tablet Take 2 tablets (2 g total) by mouth 3 (three) times daily. 120 tablet 0   diclofenac Sodium (VOLTAREN) 1 % GEL Apply 2 g topically 4 (four) times daily. 100 g 2   hydrOXYzine (ATARAX) 25 MG tablet Take 1 tablet (25 mg total) by mouth 4 (four) times daily. 120 tablet 3   loperamide (IMODIUM A-D) 2 MG tablet Take 2 mg by mouth 4 (four) times daily as needed for diarrhea or loose stools.     metoprolol tartrate (LOPRESSOR) 25 MG tablet Take 1 tablet (25 mg total) by mouth 2 (two) times daily. 180 tablet 1   omeprazole (PRILOSEC) 40 MG capsule Take 1 capsule (40 mg total) by mouth daily before breakfast. 90 capsule 0   propranolol (INDERAL) 10 MG tablet Take 2 tablets (20 mg total) by mouth 3 (three) times daily as needed (palpitations). 60 tablet 11   QUEtiapine (SEROQUEL) 100 MG tablet Take 1 tablet (100 mg total) by mouth at bedtime. 30 tablet 3   sertraline (ZOLOFT) 100 MG tablet Take 2 tablets (200 mg total) by mouth at bedtime. 60 tablet 3   traZODone (DESYREL) 100 MG tablet Take 1 tablet (100 mg total) by mouth at bedtime as needed for sleep. 30 tablet 3   No current facility-administered medications on file prior  to visit.    Allergies  Allergen Reactions   Latex Hives and Swelling   Mucinex [Guaifenesin Er] Hives, Itching and Swelling   Other Anaphylaxis and Nausea Only    Pickles - throat swelling and nausea    Penicillins Swelling    Did it involve swelling of the face/tongue/throat, SOB, or low BP? Yes Did it involve sudden or severe rash/hives, skin peeling, or any reaction on the inside of your mouth or nose? No Did you need to seek medical attention at a hospital or doctor's office? Yes When did it last happen?      >10 years ago If all above answers are "NO", may proceed with cephalosporin use.    Contrast Media [Iodinated Contrast Media] Nausea And Vomiting   Multihance [Gadobenate] Nausea And Vomiting    Social History   Socioeconomic History   Marital status: Single    Spouse name: Not on file   Number of children: 0   Years of education: college    Highest education level: Not on file  Occupational History   Occupation: Unemployed   Tobacco Use   Smoking status: Never    Passive exposure: Past   Smokeless tobacco: Never  Vaping Use   Vaping status: Never Used  Substance and Sexual Activity   Alcohol use: No   Drug use: No   Sexual activity: Yes    Birth control/protection: I.U.D.  Other Topics Concern   Not on file  Social History Narrative   Lives with mom Steward Drone)    Right-handed   Caffeine: 1 cups of coffee per day (rare)   Working not working.  Disabled.    Social Drivers of Health   Financial Resource Strain: High Risk (12/30/2022)   Overall Financial Resource Strain (CARDIA)    Difficulty of Paying Living Expenses: Hard  Food Insecurity: Food Insecurity Present (12/30/2022)   Hunger Vital Sign    Worried About Running Out of Food in the Last Year: Often true    Ran Out of Food in the Last Year: Sometimes true  Transportation Needs: No Transportation Needs (12/30/2022)   PRAPARE - Administrator, Civil Service (Medical): No    Lack of  Transportation (Non-Medical): No  Physical Activity: Insufficiently Active (12/30/2022)   Exercise Vital Sign    Days of Exercise per Week: 2 days    Minutes of Exercise per Session: 10 min  Stress: Stress Concern Present (12/30/2022)   [employer] of Occupational Health - Occupational Stress Questionnaire    Feeling of Stress : Rather much  Social Connections: Socially Isolated (12/30/2022)   Social Connection and Isolation Panel [NHANES]    Frequency of Communication with Friends and Family: Once a week    Frequency of Social Gatherings with Friends and Family: Once a week    Attends Religious Services: Never    Database administrator or Organizations: No    Attends Banker Meetings: Never    Marital Status: Never married  Catering manager Violence: Not At Risk (12/30/2022)   Humiliation, Afraid, Rape, and Kick questionnaire    Fear of Current or Ex-Partner: No    Emotionally Abused: No    Physically Abused: No    Sexually Abused: No    Family History  Problem Relation Age of Onset   Hypertension Mother    Arnold-Chiari malformation Mother    Restless legs syndrome Mother    Osteoporosis Mother    COPD Mother    Asthma Mother    Squamous cell carcinoma Mother        skin    Eczema Mother    Arthritis Mother    Peripheral Artery Disease Mother    Anxiety disorder Mother    OCD Mother    Colonic polyp Mother    Stroke Mother    Hypertension Father    Scoliosis Father    Bipolar disorder Father    Congestive Heart Failure Father    COPD Father    Depression Father    Diabetes Maternal Grandmother    Hypertension Maternal Grandmother    Dementia Maternal Grandmother    OCD Maternal Grandmother    Alzheimer's disease Maternal Grandmother    Bone cancer Maternal Grandfather 12       bone marrow    Multiple myeloma Maternal Grandfather    Alcohol abuse Maternal Grandfather    Drug abuse Maternal Grandfather    Cancer Paternal Grandmother         colon   Diabetes Paternal Grandfather    Alcohol abuse Paternal Grandfather    Drug abuse Paternal Grandfather    Dementia Paternal Grandfather    Alcohol abuse Maternal Aunt    Depression Maternal Aunt    Pancreatic cancer Maternal Aunt  Alcohol abuse Paternal Aunt    Depression Paternal Aunt    ADD / ADHD Cousin     Past Surgical History:  Procedure Laterality Date   BIOPSY  06/11/2021   Procedure: BIOPSY;  Surgeon: Napoleon Form, MD;  Location: [facility];  Service: Gastroenterology;;   BIOPSY  02/25/2023   Procedure: BIOPSY;  Surgeon: Napoleon Form, MD;  Location: [facility];  Service: Gastroenterology;;   BLADDER SURGERY     bladder stretched as a child   CHOLECYSTECTOMY  2003   COLONOSCOPY WITH PROPOFOL N/A 06/11/2021   Procedure: COLONOSCOPY WITH PROPOFOL;  Surgeon: Napoleon Form, MD;  Location: [facility];  Service: Gastroenterology;  Laterality: N/A;   DILATATION & CURETTAGE/HYSTEROSCOPY WITH MYOSURE N/A 12/14/2019   Procedure: DILATATION & CURETTAGE/HYSTEROSCOPY WITH Polypectomy with MYOSURE;  Surgeon: Tereso Newcomer, MD;  Location: [facility];  Service: Gynecology;  Laterality: N/A;   ESOPHAGOGASTRODUODENOSCOPY (EGD) WITH PROPOFOL N/A 02/25/2023   Procedure: ESOPHAGOGASTRODUODENOSCOPY (EGD) WITH PROPOFOL;  Surgeon: Napoleon Form, MD;  Location: [facility];  Service: Gastroenterology;  Laterality: N/A;   INTRAUTERINE DEVICE (IUD) INSERTION N/A 01/21/2023   Procedure: INTRAUTERINE DEVICE (IUD) INSERTION(MIRENA);  Surgeon: Adam Phenix, MD;  Location: [facility];  Service: Gynecology;  Laterality: N/A;   MOHS SURGERY Left 11/2022   Right side and left leg   OPEN REDUCTION INTERNAL FIXATION (ORIF) PROXIMAL PHALANX Right 07/23/2013   Procedure: OPEN TREATMENT RIGHT RING FINGER, PROXIMAL INTERPHALANGEAL/JOINT FRACTURE DISLOCATION;  Surgeon: Jodi Marble, MD;  Location: [facility];  Service: Orthopedics;   Laterality: Right;   TONSILLECTOMY     and adenoidectomy    ROS: Review of Systems Negative except as stated above  PHYSICAL EXAM: BP 118/78 (BP Location: Left Arm, Patient Position: Sitting, Cuff Size: Normal)   Pulse (!) 57   Temp 98 F (36.7 C) (Oral)   Ht 5\' 6"  (1.676 m)   Wt (!) 302 lb (137 kg)   LMP 04/15/2023   SpO2 97%   BMI 48.74 kg/m   Wt Readings from Last 3 Encounters:  05/01/23 (!) 302 lb (137 kg)  04/15/23 (!) 302 lb (137 kg)  03/25/23 (!) 302 lb 6.4 oz (137.2 kg)    Physical Exam  General appearance - alert, well appearing, morbidly obese middle-age Caucasian female and in no distress Mental status - normal mood, behavior, speech, dress, motor activity, and thought processes Neck - supple, no significant adenopathy Chest - clear to auscultation, no wheezes, rales or rhonchi, symmetric air entry Heart - normal rate, regular rhythm, normal S1, S2, no murmurs, rubs, clicks or gallops Extremities - peripheral pulses normal, no pedal edema, no clubbing or cyanosis Abdominal fold: Patient has overhanging of lower abdominal tissue.  Currently no significant rash or erythema noted under the abdominal fold.  She is noted to have some cornstarch in the abdominal fold in the upper inner thighs     Latest Ref Rng & Units 02/09/2023    6:57 PM 01/21/2023    8:12 AM 06/19/2022    8:14 AM  CMP  Glucose 70 - 99 mg/dL 91  95  045   BUN 6 - 20 mg/dL 11  12  12    Creatinine 0.44 - 1.00 mg/dL 4.09  8.11  9.14   Sodium 135 - 145 mmol/L 138  140  139   Potassium 3.5 - 5.1 mmol/L 3.6  3.5  3.5   Chloride 98 - 111 mmol/L 107  103  100  CO2 22 - 32 mmol/L 22   30   Calcium 8.9 - 10.3 mg/dL 8.7   9.5   Total Protein 6.5 - 8.1 g/dL 6.6     Total Bilirubin 0.0 - 1.2 mg/dL 0.6     Alkaline Phos 38 - 126 U/L 116     AST 15 - 41 U/L 26     ALT 0 - 44 U/L 29      Lipid Panel     Component Value Date/Time   CHOL 177 06/18/2022 1025   TRIG 128.0 06/18/2022 1025   HDL 42.40  06/18/2022 1025   CHOLHDL 4 06/18/2022 1025   VLDL 25.6 06/18/2022 1025   LDLCALC 109 (H) 06/18/2022 1025    CBC    Component Value Date/Time   WBC 7.7 02/09/2023 1857   RBC 4.18 02/09/2023 1857   HGB 12.1 02/09/2023 1857   HGB 13.3 06/27/2022 1434   HCT 36.7 02/09/2023 1857   HCT 39.9 06/27/2022 1434   PLT 212 02/09/2023 1857   PLT 278 06/27/2022 1434   MCV 87.8 02/09/2023 1857   MCV 85 06/27/2022 1434   MCH 28.9 02/09/2023 1857   MCHC 33.0 02/09/2023 1857   RDW 13.5 02/09/2023 1857   RDW 13.5 06/27/2022 1434   LYMPHSABS 2.6 02/09/2023 1857   LYMPHSABS 3.0 06/27/2022 1434   MONOABS 0.4 02/09/2023 1857   EOSABS 0.4 02/09/2023 1857   EOSABS 0.2 06/27/2022 1434   BASOSABS 0.0 02/09/2023 1857   BASOSABS 0.1 06/27/2022 1434    ASSESSMENT AND PLAN: 1. Restless legs (Primary) Stable on current dose gabapentin - gabapentin (NEURONTIN) 300 MG capsule; Take 1 capsule (300 mg total) by mouth at bedtime.  Dispense: 30 capsule; Refill: 3  2. Migraine with aura and without status migrainosus, not intractable We will try her with Imitrex for abortive therapy.  Went over how to use the medication. - SUMAtriptan (IMITREX) 50 MG tablet; Take 1 tablet at start of headache. May repeat in 2 hours if headache persists or recurs. Max 2 tablets in 24 hours.  Dispense: 10 tablet; Refill: 3  3. Pannus, abdominal Will refer to plastic surgeon to see if she is a candidate for any type of procedure including liposuction - Ambulatory referral to Plastic Surgery - nystatin (MYCOSTATIN/NYSTOP) powder; Apply 1 Application topically 2 (two) times daily.  Dispense: 15 g; Refill: 4  4. Intertrigo Advised to keep the area under the abdominal fold clean and dry to prevent fungal skin infection.  Prescription given for nystatin powder.  Stop using cornstarch  5. Mild intermittent asthma without complication - Ambulatory referral to Pulmonology - ipratropium-albuterol (DUONEB) 0.5-2.5 (3) MG/3ML SOLN;  Take 3 mLs by nebulization every 6 (six) hours as needed.  Dispense: 90 mL; Refill: 1  6. Need for vaccination against Streptococcus pneumoniae - Pneumococcal conjugate vaccine 20-valent    Patient was given the opportunity to ask questions.  Patient verbalized understanding of the plan and was able to repeat key elements of the plan.   This documentation was completed using Paediatric nurse.  Any transcriptional errors are unintentional.  Orders Placed This Encounter  Procedures   Pneumococcal conjugate vaccine 20-valent   Ambulatory referral to Plastic Surgery   Ambulatory referral to Pulmonology     Requested Prescriptions   Signed Prescriptions Disp Refills   gabapentin (NEURONTIN) 300 MG capsule 30 capsule 3    Sig: Take 1 capsule (300 mg total) by mouth at bedtime.   SUMAtriptan (IMITREX) 50 MG tablet  10 tablet 3    Sig: Take 1 tablet at start of headache. May repeat in 2 hours if headache persists or recurs. Max 2 tablets in 24 hours.   nystatin (MYCOSTATIN/NYSTOP) powder 15 g 4    Sig: Apply 1 Application topically 2 (two) times daily.   ipratropium-albuterol (DUONEB) 0.5-2.5 (3) MG/3ML SOLN 90 mL 1    Sig: Take 3 mLs by nebulization every 6 (six) hours as needed.    Return in about 4 weeks (around 05/29/2023) for for Welcome to Medicare Visit.  Jonah Blue, MD, FACP

## 2023-05-02 NOTE — Telephone Encounter (Signed)
Form received and placed in provider box for completion.

## 2023-05-04 ENCOUNTER — Encounter: Payer: Self-pay | Admitting: Obstetrics and Gynecology

## 2023-05-05 NOTE — Addendum Note (Signed)
 Addended by: Guy Begin on: 05/05/2023 02:06 PM   Modules accepted: Orders

## 2023-05-06 ENCOUNTER — Telehealth: Payer: Self-pay

## 2023-05-06 NOTE — Telephone Encounter (Signed)
 Copied from CRM [id number]. Topic: Clinical - Medication Question >> Apr 30, 2023  9:17 AM [name] wrote: Reason for CRM:  [employer] needs a list of [name]'s medications with doctor's signature. Also, how to give medications. [employer] faxed a request to office last week. Please fax information to [phone] >> May 06, 2023  3:04 PM [name] wrote: Please call [employer] at [phone] - Please ask for [facility] - [employer] did not get the list of medications signed by doctor.

## 2023-05-06 NOTE — Telephone Encounter (Signed)
 Patient sent a Mychart message stating she would like to be scheduled for surgery w/ Dr. Briscoe Deutscher .I called patient to let her know Dr. Briscoe Deutscher has 1 surgery opening on 07/01/23 @ 12 pm. Patient is available and would like to schedule procedure. I advised a Medicaid consent form has to be completed prior to scheduling surgery. Patient promised to stop by the office today to sign form. I asked patient to call me once the medicaid consent form is completed. Patient agreed.

## 2023-05-06 NOTE — Telephone Encounter (Signed)
 Patient left me a voicemail stating she just left Dr. Briscoe Deutscher office and she signed medicaid the consent form.

## 2023-05-07 ENCOUNTER — Ambulatory Visit (HOSPITAL_COMMUNITY)
Admission: RE | Admit: 2023-05-07 | Discharge: 2023-05-07 | Disposition: A | Discharge status: Home / Self Care | Source: Ambulatory Visit | Attending: Physician Assistant | Admitting: Physician Assistant

## 2023-05-07 DIAGNOSIS — R1319 Other dysphagia: Secondary | ICD-10-CM | POA: Diagnosis present

## 2023-05-07 NOTE — Telephone Encounter (Signed)
 Provider currently out of office. Awaiting for return so provider can sign.

## 2023-05-08 ENCOUNTER — Encounter (HOSPITAL_COMMUNITY): Payer: Self-pay

## 2023-05-08 ENCOUNTER — Ambulatory Visit (HOSPITAL_COMMUNITY): Payer: MEDICAID | Admitting: Clinical

## 2023-05-09 ENCOUNTER — Telehealth: Payer: Self-pay | Admitting: Internal Medicine

## 2023-05-09 ENCOUNTER — Ambulatory Visit (INDEPENDENT_AMBULATORY_CARE_PROVIDER_SITE_OTHER): Payer: Self-pay | Admitting: Clinical

## 2023-05-09 DIAGNOSIS — F331 Major depressive disorder, recurrent, moderate: Secondary | ICD-10-CM | POA: Diagnosis not present

## 2023-05-09 NOTE — Telephone Encounter (Signed)
 Copied from CRM [id number]. Topic: Clinical - Medication Question >> Apr 30, 2023  9:17 AM [name] wrote: Reason for CRM:  [employer] needs a list of [name]'s medications with doctor's signature. Also, how to give medications. [employer] faxed a request to office last week. Please fax information to [phone] >> May 08, 2023  5:21 PM DeAngela L wrote: Calling back to check the status of when they could receive the prescriptions and also the signature by the provider and the directions for the medications call back number is [phone] fax is [phone] >> May 06, 2023  3:04 PM [name] wrote: Please call [employer] at [phone] - Please ask for [facility] - [employer] did not get the list of medications signed by doctor.

## 2023-05-09 NOTE — Telephone Encounter (Signed)
 Provider currently out of office. Awaiting for return so provider can sign.

## 2023-05-10 NOTE — Progress Notes (Signed)
 THERAPIST PROGRESS NOTE Virtual Visit via Video Note  I connected with [name] on 05/09/2023 at 11:00 AM EDT by a video enabled telemedicine application and verified that I am speaking with the correct person using two identifiers.  Location: Patient: home Provider: office   I discussed the limitations of evaluation and management by telemedicine and the availability of in person appointments. The patient expressed understanding and agreed to proceed.   Follow Up Instructions: I discussed the assessment and treatment plan with the patient. The patient was provided an opportunity to ask questions and all were answered. The patient agreed with the plan and demonstrated an understanding of the instructions.   The patient was advised to call back or seek an in-person evaluation if the symptoms worsen or if the condition fails to improve as anticipated.   Session Time: 30 minutes  Participation Level: Active  Behavioral Response: CasualAlertDepressed  Type of Therapy: Individual Therapy  Treatment Goals addressed: Client will engage in 80% of scheduled individual psychotherapy sessions  ProgressTowards Goals: Progressing  Interventions: CBT  Summary:  Heloise Gordan is a 38 y.o. female who presents for the scheduled appointment oriented x 5, appropriately dressed, and friendly.  Client denied hallucinations and delusions. Client reported on today not much is changed.  Client reported both of her parents have a nurse aide that comes to their house to help both of them.  Client reported she has had concerns over her dad who is now experiencing numbness in his other foot and he is a diabetic.  Client reported otherwise she did get a membership to the [facility] and has been going several days a week.  Client reported she has been making some positive changes of eating better quality foods to also help with her sensitive stomach.  Client reported she is seeing positive  results from doing both of those.  Client reported at times she does struggle with viewing herself positively and having a lot of guilt about being the only child and not being able to provide a better life for her parents.  Client reported both of her parents have had traumatic childhood and upbringing's and they tell her all about it.  Client reported she feels like she has some responsibility for trying to make their outlook on life better. Evidence of progress towards goal:  client reported 1 negative cognitive pattern of feeling guilt about not being able to improve her parents quality of life.  Suicidal/Homicidal: Nowithout intent/plan  Therapist Response:  Therapist began the appointment asking the client how she has been doing since last seen. Therapist engaged with active listening and positive emotional support. Therapist dated client time to discuss her thoughts and feelings about stressors that contribute to her depressed mood. Therapist used CBT to teach the client about boundaries and reframing unrealistic cognitive patterns. Therapist used CBT to positively reinforce the clients positive coping skills and lifestyle changes. Therapist used CBT ask the client to identify her progress with frequency of use with coping skills with continued practice in her daily activity.    Therapist assigned the client homework to practice self care.   Plan: Return again in 4 weeks.  Diagnosis: moderate episode of recurrent major depressive disorder  Collaboration of Care: Patient refused AEB none requested by the client.  Patient/Guardian was advised Release of Information must be obtained prior to any record release in order to collaborate their care with an outside provider. Patient/Guardian was advised if they have not already done so  to contact the registration department to sign all necessary forms in order for [place] to release information regarding their care.   Consent: Patient/Guardian gives  verbal consent for treatment and assignment of benefits for services provided during this visit. Patient/Guardian expressed understanding and agreed to proceed.   Neena Rhymes Shaquanta Harkless, LCSW 05/09/2023

## 2023-05-13 ENCOUNTER — Other Ambulatory Visit: Payer: Self-pay

## 2023-05-14 ENCOUNTER — Other Ambulatory Visit: Payer: Self-pay | Admitting: Internal Medicine

## 2023-05-14 ENCOUNTER — Telehealth (INDEPENDENT_AMBULATORY_CARE_PROVIDER_SITE_OTHER): Payer: Self-pay | Admitting: Internal Medicine

## 2023-05-14 DIAGNOSIS — G2581 Restless legs syndrome: Secondary | ICD-10-CM

## 2023-05-14 MED ORDER — GABAPENTIN 300 MG PO CAPS: 300.0000 mg | ORAL_CAPSULE | Freq: Every day | ORAL | 1 refills | Status: DC

## 2023-05-14 MED ORDER — COLESTIPOL HCL 1 G PO TABS: 2.0000 g | ORAL_TABLET | Freq: Three times a day (TID) | ORAL | 2 refills | Status: AC

## 2023-05-14 NOTE — Telephone Encounter (Signed)
 Copied from CRM [id number]. Topic: Clinical - Medication Question >> Apr 30, 2023  9:17 AM [name] wrote: Reason for CRM:  [facility] needs a list of [name]'s medications with doctor's signature. Also, how to give medications. [facility] faxed a request to office last week. Please fax information to [phone] >> May 14, 2023  9:58 AM Nyra Capes wrote: [name] with [facility] [phone] Calling back to check the status of when they could receive all the acitve prescriptions and also the signature by the provider and the directions for the medications call back number is [phone] and please fax to [phone]   >> May 08, 2023  5:21 PM DeAngela L wrote: Calling back to check the status of when they could receive the prescriptions and also the signature by the provider and the directions for the medications call back number is [phone] fax is [phone] >> May 06, 2023  3:04 PM [name] wrote: Please call [facility] at [phone] - Please ask for [name] - [facility] did not get the list of medications signed by doctor.

## 2023-05-14 NOTE — Telephone Encounter (Signed)
 Another CRM Sent.   Copied from CRM [id number]. Topic: Clinical - Medication Question >> Apr 30, 2023  9:17 AM [name] wrote: Reason for CRM:  [facility] needs a list of [name]'s medications with doctor's signature. Also, how to give medications. [facility] faxed a request to office last week. Please fax information to [phone] >> May 14, 2023  9:58 AM Nyra Capes wrote: [name] with [facility] [phone] Calling back to check the status of when they could receive all the acitve prescriptions and also the signature by the provider and the directions for the medications call back number is [phone] and please fax to [phone]   >> May 08, 2023  5:21 PM DeAngela L wrote: Calling back to check the status of when they could receive the prescriptions and also the signature by the provider and the directions for the medications call back number is [phone] fax is [phone] >> May 06, 2023  3:04 PM [name] wrote: Please call [facility] at [phone] - Please ask for [name] - [facility] did not get the list of medications signed by doctor.

## 2023-05-15 NOTE — Telephone Encounter (Signed)
 All medications recently sent in to [facility]. No further assistance needed at this time.

## 2023-05-17 ENCOUNTER — Emergency Department (HOSPITAL_COMMUNITY)
Admission: EM | Admit: 2023-05-17 | Discharge: 2023-05-17 | Disposition: A | Discharge status: Home / Self Care | Attending: Emergency Medicine | Admitting: Emergency Medicine

## 2023-05-17 ENCOUNTER — Other Ambulatory Visit: Payer: Self-pay

## 2023-05-17 DIAGNOSIS — W57XXXA Bitten or stung by nonvenomous insect and other nonvenomous arthropods, initial encounter: Secondary | ICD-10-CM | POA: Insufficient documentation

## 2023-05-17 DIAGNOSIS — S20461A Insect bite (nonvenomous) of right back wall of thorax, initial encounter: Secondary | ICD-10-CM | POA: Diagnosis present

## 2023-05-17 DIAGNOSIS — Z91040 Latex allergy status: Secondary | ICD-10-CM | POA: Diagnosis not present

## 2023-05-17 NOTE — ED Provider Notes (Signed)
 [place] EMERGENCY DEPARTMENT AT [facility] Provider Note   CSN: [id number] Arrival date & time: 05/17/23  4098     History  Chief Complaint  Patient presents with   Tick Removal     Kristen [name] is a 38 y.o. female.  The history is provided by the patient and medical records.   38 year old female presenting to the ED with tick on her back.  Thinks it has been there for <24 hours.  She went outside to a shed this morning to get some coffee out of a spare refrigerator and felt something on her back, did not noticed tach until she went to shower this evening.  Mother attempted to remove it without success but she did use nail polish remover to try to kill it.  Home Medications Prior to Admission medications   Medication Sig Start Date End Date Taking? Authorizing Provider  albuterol (VENTOLIN HFA) 108 (90 Base) MCG/ACT inhaler Inhale 2 puffs into the lungs every 6 (six) hours as needed for wheezing or shortness of breath. 04/30/23   Lawrance Presume, MD  Benzocaine-Resorcinol (VAGISIL EX) Apply 1 application. topically as needed.    [provider]  busPIRone (BUSPAR) 10 MG tablet Take 1 tablet (10 mg total) by mouth 3 (three) times daily. 03/21/23   Parsons, Brittney E, NP  Calcium Carb-Cholecalciferol (CALCIUM 500 + D PO) Take 1 tablet by mouth daily at 6 (six) AM.    [provider]  Calcium-Phosphorus-Vitamin D (CALCIUM/VITAMIN D3/ADULT GUMMY) 250-100-500 MG-MG-UNIT CHEW Chew by mouth daily at 6 (six) AM.    [provider]  colestipol (COLESTID) 1 g tablet Take 2 tablets (2 g total) by mouth 3 (three) times daily. 05/14/23   Lawrance Presume, MD  diclofenac Sodium (VOLTAREN) 1 % GEL Apply 2 g topically 4 (four) times daily. 04/30/23   Lawrance Presume, MD  gabapentin (NEURONTIN) 300 MG capsule Take 1 capsule (300 mg total) by mouth at bedtime. 05/14/23   Lawrance Presume, MD  hydrOXYzine (ATARAX) 25 MG tablet Take 1 tablet (25 mg  total) by mouth 4 (four) times daily. 03/21/23   Parsons, Brittney E, NP  ipratropium-albuterol (DUONEB) 0.5-2.5 (3) MG/3ML SOLN Take 3 mLs by nebulization every 6 (six) hours as needed. 05/01/23   Lawrance Presume, MD  loperamide (IMODIUM A-D) 2 MG tablet Take 2 mg by mouth 4 (four) times daily as needed for diarrhea or loose stools.    [provider]  metoprolol tartrate (LOPRESSOR) 25 MG tablet Take 1 tablet (25 mg total) by mouth 2 (two) times daily. 04/30/23   Lawrance Presume, MD  nystatin (MYCOSTATIN/NYSTOP) powder Apply 1 Application topically 2 (two) times daily. 05/01/23   Lawrance Presume, MD  omeprazole (PRILOSEC) 40 MG capsule Take 1 capsule (40 mg total) by mouth daily before breakfast. 12/20/22   Edmonia Gottron, PA-C  propranolol (INDERAL) 10 MG tablet Take 2 tablets (20 mg total) by mouth 3 (three) times daily as needed (palpitations). 04/30/23   Lawrance Presume, MD  QUEtiapine (SEROQUEL) 100 MG tablet Take 1 tablet (100 mg total) by mouth at bedtime. 03/21/23   Arlyne Bering, NP  sertraline (ZOLOFT) 100 MG tablet Take 2 tablets (200 mg total) by mouth at bedtime. 03/21/23   Parsons, Brittney E, NP  SUMAtriptan (IMITREX) 50 MG tablet Take 1 tablet at start of headache. May repeat in 2 hours if headache persists or recurs. Max 2 tablets in 24 hours. 05/01/23  Marcine Matar, MD  traZODone (DESYREL) 100 MG tablet Take 1 tablet (100 mg total) by mouth at bedtime as needed for sleep. 03/21/23   Shanna Cisco, NP      Allergies    Latex, Mucinex [guaifenesin er], Other, Penicillins, Contrast media [iodinated contrast media], and Multihance [gadobenate]    Review of Systems   Review of Systems  Skin:        tick  All other systems reviewed and are negative.   Physical Exam Updated Vital Signs BP (!) 134/92 (BP Location: Left Arm)   Pulse 65   Temp 98 F (36.7 C) (Oral)   Resp 16   SpO2 100%   Physical Exam Vitals and nursing note reviewed.   Constitutional:      Appearance: She is well-developed.  HENT:     Head: Normocephalic and atraumatic.  Eyes:     Conjunctiva/sclera: Conjunctivae normal.     Pupils: Pupils are equal, round, and reactive to light.  Cardiovascular:     Rate and Rhythm: Normal rate and regular rhythm.     Heart sounds: Normal heart sounds.  Pulmonary:     Effort: Pulmonary effort is normal.     Breath sounds: Normal breath sounds.  Abdominal:     General: Bowel sounds are normal.     Palpations: Abdomen is soft.  Musculoskeletal:        General: Normal range of motion.     Cervical back: Normal range of motion.     Comments: Tick present to right upper back, appears flat/dead, no localized erythema/induration, no rash  Skin:    General: Skin is warm and dry.  Neurological:     Mental Status: She is alert and oriented to person, place, and time.     ED Results / Procedures / Treatments   Labs (all labs ordered are listed, but only abnormal results are displayed) Labs Reviewed - No data to display  EKG None  Radiology No results found.  Procedures .Foreign Body Removal  Date/Time: 05/17/2023 5:16 AM  Performed by: Garlon Hatchet, PA-C Authorized by: Garlon Hatchet, PA-C  Consent: Verbal consent obtained. Risks and benefits: risks, benefits and alternatives were discussed Consent given by: patient Patient understanding: patient states understanding of the procedure being performed Required items: required blood products, implants, devices, and special equipment available Patient identity confirmed: verbally with patient Time out: Immediately prior to procedure a "time out" was called to verify the correct patient, procedure, equipment, support staff and site/side marked as required. Body area: skin General location: trunk Location details: back  Sedation: Patient sedated: no  Patient restrained: no Localization method: visualized Removal mechanism: forceps Tendon  involvement: none Depth: subcutaneous Complexity: simple 1 objects recovered. Objects recovered: tick Post-procedure assessment: foreign body removed Patient tolerance: patient tolerated the procedure well with no immediate complications      Medications Ordered in ED Medications - No data to display  ED Course/ Medical Decision Making/ A&P                                 Medical Decision Making  38 year old female here with tick on upper back.  This appears flat/dead.  This was removed in entirety and is in fact dead.  Tick is flat, not engorged.  No blood expressed after removal.  Throughly cleansed afterwards.  She does not have any current rash and no erythema around the bite  site.  Very low suspicion for tickborne illness.  Will hold off on prophylaxis at this time.  Can continue home wound care and follow-up with PCP.  Return here for new concerns.  Final Clinical Impression(s) / ED Diagnoses Final diagnoses:  Tick bite with subsequent removal of tick    Rx / DC Orders ED Discharge Orders     None         Coretha Dew, PA-C 05/17/23 1610    Roberts Ching, MD 05/18/23 [phone]

## 2023-05-17 NOTE — ED Triage Notes (Signed)
 Patient requesting tick remove at her mid back , denies fever or chills/respirations unlabored .

## 2023-05-19 ENCOUNTER — Other Ambulatory Visit: Payer: Self-pay

## 2023-05-21 ENCOUNTER — Ambulatory Visit (HOSPITAL_COMMUNITY): Admitting: Clinical

## 2023-05-21 DIAGNOSIS — F331 Major depressive disorder, recurrent, moderate: Secondary | ICD-10-CM

## 2023-05-28 ENCOUNTER — Ambulatory Visit (INDEPENDENT_AMBULATORY_CARE_PROVIDER_SITE_OTHER): Admitting: Plastic Surgery

## 2023-05-28 VITALS — BP 131/80 | HR 82 | Ht 66.5 in | Wt 298.6 lb

## 2023-05-28 DIAGNOSIS — E65 Localized adiposity: Secondary | ICD-10-CM | POA: Diagnosis not present

## 2023-05-28 DIAGNOSIS — Z6841 Body Mass Index (BMI) 40.0 and over, adult: Secondary | ICD-10-CM | POA: Diagnosis not present

## 2023-05-28 DIAGNOSIS — M793 Panniculitis, unspecified: Secondary | ICD-10-CM | POA: Diagnosis not present

## 2023-05-28 NOTE — Progress Notes (Signed)
 Referring Provider Lawrance Presume, MD [address] [place],  [place] [address]   CC:  Chief Complaint  Patient presents with   Advice Only      Kristen [name] is an 38 y.o. female.  HPI: Kristen [name] is a very pleasant 38 year old female who presents today with complaints of ongoing rashes and irritation of the skin underneath her pannus.  She states that she has had difficulty with this every since she began menstruating in her preteens.  She has a history of PCOS and has had difficulty with weight gain due to the hormonal imbalances.  She is interested in surgical removal of the excess skin and fat on her abdominal wall.  Allergies  Allergen Reactions   Latex Hives and Swelling   Mucinex [Guaifenesin Er] Hives, Itching and Swelling   Other Anaphylaxis and Nausea Only    Pickles - throat swelling and nausea    Penicillins Swelling    Did it involve swelling of the face/tongue/throat, SOB, or low BP? Yes Did it involve sudden or severe rash/hives, skin peeling, or any reaction on the inside of your mouth or nose? No Did you need to seek medical attention at a hospital or doctor's office? Yes When did it last happen?      >10 years ago If all above answers are "NO", may proceed with cephalosporin use.    Contrast Media [Iodinated Contrast Media] Nausea And Vomiting   Multihance  [Gadobenate] Nausea And Vomiting    Outpatient Encounter Medications as of 05/28/2023  Medication Sig   albuterol  (VENTOLIN  HFA) 108 (90 Base) MCG/ACT inhaler Inhale 2 puffs into the lungs every 6 (six) hours as needed for wheezing or shortness of breath.   Benzocaine -Resorcinol (VAGISIL EX) Apply 1 application. topically as needed.   busPIRone  (BUSPAR ) 10 MG tablet Take 1 tablet (10 mg total) by mouth 3 (three) times daily.   Calcium Carb-Cholecalciferol (CALCIUM 500 + D PO) Take 1 tablet by mouth daily at 6 (six) AM.   Calcium-Phosphorus-Vitamin D (CALCIUM/VITAMIN D3/ADULT GUMMY)  250-100-500 MG-MG-UNIT CHEW Chew by mouth daily at 6 (six) AM.   colestipol  (COLESTID ) 1 g tablet Take 2 tablets (2 g total) by mouth 3 (three) times daily.   diclofenac  Sodium (VOLTAREN ) 1 % GEL Apply 2 g topically 4 (four) times daily.   gabapentin  (NEURONTIN ) 300 MG capsule Take 1 capsule (300 mg total) by mouth at bedtime.   hydrOXYzine  (ATARAX ) 25 MG tablet Take 1 tablet (25 mg total) by mouth 4 (four) times daily.   ipratropium-albuterol  (DUONEB) 0.5-2.5 (3) MG/3ML SOLN Take 3 mLs by nebulization every 6 (six) hours as needed.   loperamide (IMODIUM A-D) 2 MG tablet Take 2 mg by mouth 4 (four) times daily as needed for diarrhea or loose stools.   metoprolol  tartrate (LOPRESSOR ) 25 MG tablet Take 1 tablet (25 mg total) by mouth 2 (two) times daily.   nystatin  (MYCOSTATIN /NYSTOP ) powder Apply 1 Application topically 2 (two) times daily.   omeprazole  (PRILOSEC) 40 MG capsule Take 1 capsule (40 mg total) by mouth daily before breakfast.   propranolol  (INDERAL ) 10 MG tablet Take 2 tablets (20 mg total) by mouth 3 (three) times daily as needed (palpitations).   QUEtiapine  (SEROQUEL ) 100 MG tablet Take 1 tablet (100 mg total) by mouth at bedtime.   sertraline  (ZOLOFT ) 100 MG tablet Take 2 tablets (200 mg total) by mouth at bedtime.   SUMAtriptan  (IMITREX ) 50 MG tablet Take 1 tablet at start of headache.  May repeat in 2 hours if headache persists or recurs. Max 2 tablets in 24 hours.   traZODone  (DESYREL ) 100 MG tablet Take 1 tablet (100 mg total) by mouth at bedtime as needed for sleep.   No facility-administered encounter medications on file as of 05/28/2023.     Past Medical History:  Diagnosis Date   Anxiety 2013   treated at [facility] by Terresa Ferry    Asthma 1989   no hospitalization, or intubation, previously on advair and singulair . asthma worsening.  Follows w/ Dr. Concetta Dee @ [facility]. Per pt on 01/17/23, she only uses inhaler if she is doing strenous exercise or during the  change of seasons.   Chronic diarrhea    Follows w/ [facility].   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   Dysfunctional uterine bleeding    Dysrhythmia 2010   PVC's, palpitations   Ehlers-Danlos syndrome    Follows with Dr. Luevenia Saha, Sports Medicine and PCP.   Essential hypertension 03/12/2019   Follows w/ [facility] cardiology.   GERD (gastroesophageal reflux disease)    History of hiatal hernia    History of suicidal ideation    IBS (irritable bowel syndrome)    Insulin  resistance    Follows w/ endocrinology.   Kyphosis of thoracic region 2004   painful, runs in dad side    Lymphocytic hypophysitis (HCC)    Follows w/ endocrinology.   Migraine    Follows w/ PCP.   Orthostatic hypotension    Follows w/ [facility], Slater Duncan, NP.   Pancreatitis due to biliary obstruction 2003   PCOS (polycystic ovarian syndrome) 2014   facial hair, irregular periods, never been pregnant    PTSD (post-traumatic stress disorder)    Follows w/ Behavioral Health.   Sleep apnea    not currently wearing Cpap   Social phobia    Follows w/ Behavioral Health.    Past Surgical History:  Procedure Laterality Date   BIOPSY  06/11/2021   Procedure: BIOPSY;  Surgeon: Sergio Dandy, MD;  Location: [facility];  Service: Gastroenterology;;   BIOPSY  02/25/2023   Procedure: BIOPSY;  Surgeon: Sergio Dandy, MD;  Location: [facility];  Service: Gastroenterology;;   BLADDER SURGERY     bladder stretched as a child   CHOLECYSTECTOMY  2003   COLONOSCOPY WITH PROPOFOL  N/A 06/11/2021   Procedure: COLONOSCOPY WITH PROPOFOL ;  Surgeon: Sergio Dandy, MD;  Location: [facility];  Service: Gastroenterology;  Laterality: N/A;   DILATATION & CURETTAGE/HYSTEROSCOPY WITH MYOSURE N/A 12/14/2019   Procedure: DILATATION & CURETTAGE/HYSTEROSCOPY WITH Polypectomy with MYOSURE;   Surgeon: Julianne Octave, MD;  Location: [facility];  Service: Gynecology;  Laterality: N/A;   ESOPHAGOGASTRODUODENOSCOPY (EGD) WITH PROPOFOL  N/A 02/25/2023   Procedure: ESOPHAGOGASTRODUODENOSCOPY (EGD) WITH PROPOFOL ;  Surgeon: Sergio Dandy, MD;  Location: [facility];  Service: Gastroenterology;  Laterality: N/A;   INTRAUTERINE DEVICE (IUD) INSERTION N/A 01/21/2023   Procedure: INTRAUTERINE DEVICE (IUD) INSERTION(MIRENA );  Surgeon: Tresia Fruit, MD;  [facility];  Service: Gynecology;  Laterality: N/A;   MOHS SURGERY Left 11/2022   Right side and left leg   OPEN REDUCTION INTERNAL FIXATION (ORIF) PROXIMAL PHALANX Right 07/23/2013   Procedure: OPEN TREATMENT RIGHT RING FINGER, PROXIMAL INTERPHALANGEAL/JOINT FRACTURE DISLOCATION;  Surgeon: Sheryl Donna, MD;  Location: [facility];  Service:  Orthopedics;  Laterality: Right;   TONSILLECTOMY     and adenoidectomy    Family History  Problem Relation Age of Onset   Hypertension Mother    Arnold-Chiari malformation Mother    Restless legs syndrome Mother    Osteoporosis Mother    COPD Mother    Asthma Mother    Squamous cell carcinoma Mother        skin    Eczema Mother    Arthritis Mother    Peripheral Artery Disease Mother    Anxiety disorder Mother    OCD Mother    Colonic polyp Mother    Stroke Mother    Hypertension Father    Scoliosis Father    Bipolar disorder Father    Congestive Heart Failure Father    COPD Father    Depression Father    Diabetes Maternal Grandmother    Hypertension Maternal Grandmother    Dementia Maternal Grandmother    OCD Maternal Grandmother    Alzheimer's disease Maternal Grandmother    Bone cancer Maternal Grandfather 48       bone marrow    Multiple myeloma Maternal Grandfather    Alcohol abuse Maternal Grandfather    Drug abuse Maternal Grandfather    Cancer Paternal Grandmother        colon   Diabetes Paternal Grandfather    Alcohol abuse  Paternal Grandfather    Drug abuse Paternal Grandfather    Dementia Paternal Grandfather    Alcohol abuse Maternal Aunt    Depression Maternal Aunt    Pancreatic cancer Maternal Aunt    Alcohol abuse Paternal Aunt    Depression Paternal Aunt    ADD / ADHD Cousin     Social History   Social History Narrative   Lives with mom Kristen [name])    Right-handed   Caffeine : 1 cups of coffee per day (rare)   Working not working.  Disabled.      Review of Systems General: Denies fevers, chills, weight loss CV: Denies chest pain, shortness of breath, palpitations Abdomen: Ongoing rashes treated with baby powder and most recently nystatin .  The rashes improve but do not go away with treatment.  Physical Exam    05/28/2023    2:47 PM 05/17/2023    4:08 AM 05/17/2023   12:37 AM  Vitals with BMI  Height 5' 6.5"    Weight 298 lbs 10 oz    BMI 47.48    Systolic 131 134 161  Diastolic 80 92 96  Pulse 82 65 76    General:  No acute distress,  Alert and oriented, Non-Toxic, Normal speech and affect Abdomen: Patient not examined on this visit Mammogram: Not applicable due to age Assessment/Plan Panniculitis: Patient has ongoing issues with panniculitis.  She would like to have the pannus removed.  At this time she has a BMI of 47.5.  I discussed at length with her the significantly increased risk of wound healing complications with panniculectomy's in general.  This risk is elevated even higher for patients who have a BMI greater than 40.  She is currently working on weight loss through diet and exercise and has had a 30 pound weight loss.  I have offered to refer her to the healthy weight and wellness program and have suggested that she might discuss the weight loss medications with her primary care provider.  Additionally she would be appreciative of 2 to 48-month follow-up to stay in touch with the clinic and to follow the progress of  her weight loss.  Will schedule her for follow-up as  desired.  Teretha Ferguson 05/28/2023, 4:16 PM

## 2023-05-29 ENCOUNTER — Encounter (INDEPENDENT_AMBULATORY_CARE_PROVIDER_SITE_OTHER): Payer: Self-pay

## 2023-05-29 ENCOUNTER — Ambulatory Visit: Payer: Medicaid Other | Admitting: "Endocrinology

## 2023-05-29 ENCOUNTER — Encounter: Payer: Self-pay | Admitting: "Endocrinology

## 2023-06-02 ENCOUNTER — Telehealth: Payer: Self-pay | Admitting: Family Medicine

## 2023-06-02 NOTE — Telephone Encounter (Signed)
 Attempted to call patient to schedule preop appointment with Dr.Ajewole.

## 2023-06-03 NOTE — Telephone Encounter (Signed)
 Called pt back, transferred pt to the front desk to get rescheduled.

## 2023-06-04 ENCOUNTER — Encounter: Payer: Self-pay | Admitting: Gastroenterology

## 2023-06-04 ENCOUNTER — Other Ambulatory Visit: Payer: Self-pay

## 2023-06-04 ENCOUNTER — Ambulatory Visit: Payer: MEDICAID | Admitting: Gastroenterology

## 2023-06-04 VITALS — BP 136/88 | HR 90 | Temp 97.8°F | Resp 19 | Ht 66.0 in | Wt 302.0 lb

## 2023-06-04 DIAGNOSIS — K449 Diaphragmatic hernia without obstruction or gangrene: Secondary | ICD-10-CM | POA: Diagnosis not present

## 2023-06-04 DIAGNOSIS — R1310 Dysphagia, unspecified: Secondary | ICD-10-CM | POA: Diagnosis not present

## 2023-06-04 DIAGNOSIS — R1319 Other dysphagia: Secondary | ICD-10-CM

## 2023-06-04 DIAGNOSIS — K222 Esophageal obstruction: Secondary | ICD-10-CM | POA: Diagnosis not present

## 2023-06-04 DIAGNOSIS — K219 Gastro-esophageal reflux disease without esophagitis: Secondary | ICD-10-CM | POA: Diagnosis not present

## 2023-06-04 DIAGNOSIS — K2100 Gastro-esophageal reflux disease with esophagitis, without bleeding: Secondary | ICD-10-CM | POA: Diagnosis not present

## 2023-06-04 MED ORDER — OMEPRAZOLE 40 MG PO CPDR
40.0000 mg | DELAYED_RELEASE_CAPSULE | Freq: Two times a day (BID) | ORAL | 3 refills | Status: DC
Start: 2023-06-04 — End: 2023-07-07
  Filled 2023-06-04: qty 90, 45d supply, fill #0

## 2023-06-04 MED ORDER — SODIUM CHLORIDE 0.9 % IV SOLN: 500.0000 mL | Freq: Once | INTRAVENOUS | Status: DC

## 2023-06-04 NOTE — Patient Instructions (Signed)
**  Handout given on post dilation diet**   YOU HAD AN ENDOSCOPIC PROCEDURE TODAY AT THE [facility]:   Refer to the procedure report that was given to you for any specific questions about what was found during the examination.  If the procedure report does not answer your questions, please call your gastroenterologist to clarify.  If you requested that your care partner not be given the details of your procedure findings, then the procedure report has been included in a sealed envelope for you to review at your convenience later.  YOU SHOULD EXPECT: Some feelings of bloating in the abdomen. Passage of more gas than usual.  Walking can help get rid of the air that was put into your GI tract during the procedure and reduce the bloating. If you had a lower endoscopy (such as a colonoscopy or flexible sigmoidoscopy) you may notice spotting of blood in your stool or on the toilet paper. If you underwent a bowel prep for your procedure, you may not have a normal bowel movement for a few days.  Please Note:  You might notice some irritation and congestion in your nose or some drainage.  This is from the oxygen used during your procedure.  There is no need for concern and it should clear up in a day or so.  SYMPTOMS TO REPORT IMMEDIATELY:  Following upper endoscopy (EGD)  Vomiting of blood or coffee ground material  New chest pain or pain under the shoulder blades  Painful or persistently difficult swallowing  New shortness of breath  Fever of 100F or higher  Black, tarry-looking stools  For urgent or emergent issues, a gastroenterologist can be reached at any hour by calling [phone]. Do not use MyChart messaging for urgent concerns.    DIET:  Clear liquids for the rest of the day. Soft diet for 3 days and then advance as tolerated. Drink plenty of fluids but you should avoid alcoholic beverages for 24 hours.  ACTIVITY:  You should plan to take it easy for the rest of today and  you should NOT DRIVE or use heavy machinery until tomorrow (because of the sedation medicines used during the test).    FOLLOW UP: Our staff will call the number listed on your records the next business day following your procedure.  We will call around 7:15- 8:00 am to check on you and address any questions or concerns that you may have regarding the information given to you following your procedure. If we do not reach you, we will leave a message.     If any biopsies were taken you will be contacted by phone or by letter within the next 1-3 weeks.  Please call us  at [phone] if you have not heard about the biopsies in 3 weeks.    SIGNATURES/CONFIDENTIALITY: You and/or your care partner have signed paperwork which will be entered into your electronic medical record.  These signatures attest to the fact that that the information above on your After Visit Summary has been reviewed and is understood.  Full responsibility of the confidentiality of this discharge information lies with you and/or your care-partner.

## 2023-06-04 NOTE — Progress Notes (Signed)
 Pt's states no medical or surgical changes since previsit or office visit.

## 2023-06-04 NOTE — Progress Notes (Signed)
 Sedate, gd SR, tolerated procedure well, VSS, report to RN

## 2023-06-04 NOTE — Progress Notes (Signed)
   THERAPIST PROGRESS NOTE Virtual Visit via Video Note  I connected with Kristen [name] on 05/21/2023 at  4:00 PM EDT by a video enabled telemedicine application and verified that I am speaking with the correct person using two identifiers.  Location: Patient: home Provider: office   I discussed the limitations of evaluation and management by telemedicine and the availability of in person appointments. The patient expressed understanding and agreed to proceed.   Follow Up Instructions:    I discussed the assessment and treatment plan with the patient. The patient was provided an opportunity to ask questions and all were answered. The patient agreed with the plan and demonstrated an understanding of the instructions.   The patient was advised to call back or seek an in-person evaluation if the symptoms worsen or if the condition fails to improve as anticipated.   Session Time: 30 minutes  Participation Level: Active  Behavioral Response: CasualAlertEuthymic  Type of Therapy: Individual Therapy  Treatment Goals addressed: Therapist will utilize solution-focused and CBT interventions to assist Kristen [name] in identifying thoughts and behaviors contributing to her feelings of anxiety and depression   ProgressTowards Goals: Progressing  Interventions: CBT and Supportive  Summary:  Kristen [name] is a 38 y.o. female who presents for the scheduled appointment oriented times five, appropriately dressed and friendly. Client denied hallucinations and delusions. Client reported she is doing well today. Client reported she recently she went to the ER because she had a tick on her back. Client reported otherwise she had not been to the gym recently. Client reported there has been a lot going on at home with helping to care for her parents. Client reported the nurse aids have either been calling out or not doing their job to its entirety. Client reported she helps her parents a  lot. Client reported she is excited because her boyfriend bought her tickets to see a band that she loves. Client reported being in a large crowd is uncomfortable but she will go and enjoy herself. Client reported she does not have any plans for Easter but will see about spending time with some friends. Evidence of progress towards goal:  client reported 1 positive which is handling stressors affectively.  Suicidal/Homicidal: Nowithout intent/plan  Therapist Response:  Therapist began the appointment asking the client how she has been doing. Therapist engaged using active listening and positive emotional support. Therapist used cbt to engage ask her about engagement in daily activities compared to ongoing depressive and anxiety symptoms she experiences. Therapist used cbt to reinforce taking time to enjoy activities outside of the home. Therapist used CBT ask the client to identify her progress with frequency of use with coping skills with continued practice in her daily activity.    Therapist assigned the client homework to practice self care.  Plan: Return again in 4 weeks.  Diagnosis: moderate episode of recurrent major depressive disorder  Collaboration of Care: Patient refused AEB none requested by the client.  Patient/Guardian was advised Release of Information must be obtained prior to any record release in order to collaborate their care with an outside provider. Patient/Guardian was advised if they have not already done so to contact the registration department to sign all necessary forms in order for us  to release information regarding their care.   Consent: Patient/Guardian gives verbal consent for treatment and assignment of benefits for services provided during this visit. Patient/Guardian expressed understanding and agreed to proceed.   Jerimy Johanson Y Aurelio Mccamy, LCSW 05/21/2023

## 2023-06-04 NOTE — Progress Notes (Signed)
 [facility] History and Physical   Primary Care Physician:  Lawrance Presume, MD   Reason for Procedure:  GERD with erosive esophagitis  Plan:    EGD with possible interventions as needed     HPI: Kristen [name] is a very pleasant 38 y.o. female here for EGD for erosive esophagitis surveillance, persistent GERD symptoms.  Please refer to office visit note 04/15/23 by Santina Cull for details  The risks and benefits as well as alternatives of endoscopic procedure(s) have been discussed and reviewed. All questions answered. The patient agrees to proceed.    Past Medical History:  Diagnosis Date   Anxiety 2013   treated at [facility] by Terresa Ferry    Asthma 1989   no hospitalization, or intubation, previously on advair and singulair . asthma worsening.  Follows w/ Dr. Concetta Dee @ [facility]. Per pt on 01/17/23, she only uses inhaler if she is doing strenous exercise or during the change of seasons.   Chronic diarrhea    Follows w/ [facility].   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   Dysfunctional uterine bleeding    Dysrhythmia 2010   PVC's, palpitations   Ehlers-Danlos syndrome    Follows with Dr. Luevenia Saha, Sports Medicine and PCP.   Essential hypertension 03/12/2019   Follows w/ [facility] cardiology.   GERD (gastroesophageal reflux disease)    History of hiatal hernia    History of suicidal ideation    IBS (irritable bowel syndrome)    Insulin  resistance    Follows w/ endocrinology.   Kyphosis of thoracic region 2004   painful, runs in dad side    Lymphocytic hypophysitis (HCC)    Follows w/ endocrinology.   Migraine    Follows w/ PCP.   Orthostatic hypotension    Follows w/ [facility], Slater Duncan, NP.   Pancreatitis due to biliary obstruction 2003   PCOS (polycystic ovarian syndrome) 2014   facial hair, irregular  periods, never been pregnant    PTSD (post-traumatic stress disorder)    Follows w/ Behavioral Health.   Sleep apnea    not currently wearing Cpap   Social phobia    Follows w/ Behavioral Health.    Past Surgical History:  Procedure Laterality Date   BIOPSY  06/11/2021   Procedure: BIOPSY;  Surgeon: Sergio Dandy, MD;  Location: [facility];  Service: Gastroenterology;;   BIOPSY  02/25/2023   Procedure: BIOPSY;  Surgeon: Sergio Dandy, MD;  Location: [facility];  Service: Gastroenterology;;   BLADDER SURGERY     bladder stretched as a child   CHOLECYSTECTOMY  2003   COLONOSCOPY WITH PROPOFOL  N/A 06/11/2021   Procedure: COLONOSCOPY WITH PROPOFOL ;  Surgeon: Sergio Dandy, MD;  Location: [facility];  Service: Gastroenterology;  Laterality: N/A;   DILATATION & CURETTAGE/HYSTEROSCOPY WITH MYOSURE N/A 12/14/2019   Procedure: DILATATION & CURETTAGE/HYSTEROSCOPY WITH Polypectomy with MYOSURE;  Surgeon: Julianne Octave, MD;  Location: [facility];  Service: Gynecology;  Laterality: N/A;   ESOPHAGOGASTRODUODENOSCOPY (EGD) WITH PROPOFOL  N/A 02/25/2023   Procedure: ESOPHAGOGASTRODUODENOSCOPY (EGD) WITH PROPOFOL ;  Surgeon: Sergio Dandy, MD;  Location: [facility];  Service: Gastroenterology;  Laterality: N/A;   INTRAUTERINE DEVICE (IUD) INSERTION N/A 01/21/2023   Procedure: INTRAUTERINE DEVICE (IUD) INSERTION(MIRENA );  Surgeon: Tresia Fruit, MD;  Location: [facility];  Service: Gynecology;  Laterality: N/A;  MOHS SURGERY Left 11/2022   Right side and left leg   OPEN REDUCTION INTERNAL FIXATION (ORIF) PROXIMAL PHALANX Right 07/23/2013   Procedure: OPEN TREATMENT RIGHT RING FINGER, PROXIMAL INTERPHALANGEAL/JOINT FRACTURE DISLOCATION;  Surgeon: Sheryl Donna, MD;  Location: [facility];  Service: Orthopedics;  Laterality: Right;   TONSILLECTOMY     and adenoidectomy    Prior to Admission medications   Medication Sig Start Date End Date  Taking? Authorizing Provider  albuterol  (VENTOLIN  HFA) 108 (90 Base) MCG/ACT inhaler Inhale 2 puffs into the lungs every 6 (six) hours as needed for wheezing or shortness of breath. 04/30/23  Yes Lawrance Presume, MD  busPIRone  (BUSPAR ) 10 MG tablet Take 1 tablet (10 mg total) by mouth 3 (three) times daily. 03/21/23  Yes Ardena Koyanagi E, NP  Calcium Carb-Cholecalciferol (CALCIUM 500 + D PO) Take 1 tablet by mouth daily at 6 (six) AM.   Yes [provider]  Calcium-Phosphorus-Vitamin D (CALCIUM/VITAMIN D3/ADULT GUMMY) 250-100-500 MG-MG-UNIT CHEW Chew by mouth daily at 6 (six) AM.   Yes [provider]  colestipol  (COLESTID ) 1 g tablet Take 2 tablets (2 g total) by mouth 3 (three) times daily. 05/14/23  Yes Lawrance Presume, MD  hydrOXYzine  (ATARAX ) 25 MG tablet Take 1 tablet (25 mg total) by mouth 4 (four) times daily. 03/21/23  Yes Ardena Koyanagi E, NP  ipratropium-albuterol  (DUONEB) 0.5-2.5 (3) MG/3ML SOLN Take 3 mLs by nebulization every 6 (six) hours as needed. 05/01/23  Yes Lawrance Presume, MD  loperamide (IMODIUM A-D) 2 MG tablet Take 2 mg by mouth 4 (four) times daily as needed for diarrhea or loose stools.   Yes [provider]  metoprolol  tartrate (LOPRESSOR ) 25 MG tablet Take 1 tablet (25 mg total) by mouth 2 (two) times daily. 04/30/23  Yes Lawrance Presume, MD  nystatin  (MYCOSTATIN /NYSTOP ) powder Apply 1 Application topically 2 (two) times daily. 05/01/23  Yes Lawrance Presume, MD  omeprazole  (PRILOSEC) 40 MG capsule Take 1 capsule (40 mg total) by mouth daily before breakfast. 12/20/22  Yes Santina Cull R, PA-C  propranolol  (INDERAL ) 10 MG tablet Take 2 tablets (20 mg total) by mouth 3 (three) times daily as needed (palpitations). 04/30/23  Yes Lawrance Presume, MD  sertraline  (ZOLOFT ) 100 MG tablet Take 2 tablets (200 mg total) by mouth at bedtime. 03/21/23  Yes Arlyne Bering, NP  Benzocaine -Resorcinol (VAGISIL EX) Apply 1 application.  topically as needed.    [provider]  diclofenac  Sodium (VOLTAREN ) 1 % GEL Apply 2 g topically 4 (four) times daily. 04/30/23   Lawrance Presume, MD  gabapentin  (NEURONTIN ) 300 MG capsule Take 1 capsule (300 mg total) by mouth at bedtime. 05/14/23   Lawrance Presume, MD  QUEtiapine  (SEROQUEL ) 100 MG tablet Take 1 tablet (100 mg total) by mouth at bedtime. 03/21/23   Arlyne Bering, NP  SUMAtriptan  (IMITREX ) 50 MG tablet Take 1 tablet at start of headache. May repeat in 2 hours if headache persists or recurs. Max 2 tablets in 24 hours. 05/01/23   Lawrance Presume, MD  traZODone  (DESYREL ) 100 MG tablet Take 1 tablet (100 mg total) by mouth at bedtime as needed for sleep. 03/21/23   Arlyne Bering, NP    Current Outpatient Medications  Medication Sig Dispense Refill   albuterol  (VENTOLIN  HFA) 108 (90 Base) MCG/ACT inhaler Inhale 2 puffs into the lungs every 6 (six) hours as needed for wheezing or shortness of breath. 18 g 6  busPIRone  (BUSPAR ) 10 MG tablet Take 1 tablet (10 mg total) by mouth 3 (three) times daily. 90 tablet 3   Calcium Carb-Cholecalciferol (CALCIUM 500 + D PO) Take 1 tablet by mouth daily at 6 (six) AM.     Calcium-Phosphorus-Vitamin D (CALCIUM/VITAMIN D3/ADULT GUMMY) 250-100-500 MG-MG-UNIT CHEW Chew by mouth daily at 6 (six) AM.     colestipol  (COLESTID ) 1 g tablet Take 2 tablets (2 g total) by mouth 3 (three) times daily. 180 tablet 2   hydrOXYzine  (ATARAX ) 25 MG tablet Take 1 tablet (25 mg total) by mouth 4 (four) times daily. 120 tablet 3   ipratropium-albuterol  (DUONEB) 0.5-2.5 (3) MG/3ML SOLN Take 3 mLs by nebulization every 6 (six) hours as needed. 90 mL 1   loperamide (IMODIUM A-D) 2 MG tablet Take 2 mg by mouth 4 (four) times daily as needed for diarrhea or loose stools.     metoprolol  tartrate (LOPRESSOR ) 25 MG tablet Take 1 tablet (25 mg total) by mouth 2 (two) times daily. 180 tablet 1   nystatin  (MYCOSTATIN /NYSTOP ) powder Apply 1 Application  topically 2 (two) times daily. 15 g 4   omeprazole  (PRILOSEC) 40 MG capsule Take 1 capsule (40 mg total) by mouth daily before breakfast. 90 capsule 0   propranolol  (INDERAL ) 10 MG tablet Take 2 tablets (20 mg total) by mouth 3 (three) times daily as needed (palpitations). 60 tablet 11   sertraline  (ZOLOFT ) 100 MG tablet Take 2 tablets (200 mg total) by mouth at bedtime. 60 tablet 3   Benzocaine -Resorcinol (VAGISIL EX) Apply 1 application. topically as needed.     diclofenac  Sodium (VOLTAREN ) 1 % GEL Apply 2 g topically 4 (four) times daily. 100 g 2   gabapentin  (NEURONTIN ) 300 MG capsule Take 1 capsule (300 mg total) by mouth at bedtime. 90 capsule 1   QUEtiapine  (SEROQUEL ) 100 MG tablet Take 1 tablet (100 mg total) by mouth at bedtime. 30 tablet 3   SUMAtriptan  (IMITREX ) 50 MG tablet Take 1 tablet at start of headache. May repeat in 2 hours if headache persists or recurs. Max 2 tablets in 24 hours. 10 tablet 3   traZODone  (DESYREL ) 100 MG tablet Take 1 tablet (100 mg total) by mouth at bedtime as needed for sleep. 30 tablet 3   Current Facility-Administered Medications  Medication Dose Route Frequency Provider Last Rate Last Admin   0.9 %  sodium chloride  infusion  500 mL Intravenous Once Valaria Kohut V, MD        Allergies as of 06/04/2023 - Review Complete 06/04/2023  Allergen Reaction Noted   Latex Hives and Swelling 12/13/2019   Mucinex [guaifenesin er] Hives, Itching, and Swelling 08/27/2011   Other Anaphylaxis and Nausea Only 12/13/2019   Penicillins Swelling 08/23/2016   Contrast media [iodinated contrast media] Nausea And Vomiting 08/23/2016   Multihance  [gadobenate] Nausea And Vomiting 07/14/2016    Family History  Problem Relation Age of Onset   Colon cancer Mother    Hypertension Mother    Arnold-Chiari malformation Mother    Restless legs syndrome Mother    Osteoporosis Mother    COPD Mother    Asthma Mother    Squamous cell carcinoma Mother        skin     Eczema Mother    Arthritis Mother    Peripheral Artery Disease Mother    Anxiety disorder Mother    OCD Mother    Colonic polyp Mother    Stroke Mother    Hypertension Father  Scoliosis Father    Bipolar disorder Father    Congestive Heart Failure Father    COPD Father    Depression Father    Alcohol abuse Maternal Aunt    Depression Maternal Aunt    Pancreatic cancer Maternal Aunt    Alcohol abuse Paternal Aunt    Depression Paternal Aunt    Diabetes Maternal Grandmother    Hypertension Maternal Grandmother    Dementia Maternal Grandmother    OCD Maternal Grandmother    Alzheimer's disease Maternal Grandmother    Bone cancer Maternal Grandfather 30       bone marrow    Multiple myeloma Maternal Grandfather    Alcohol abuse Maternal Grandfather    Drug abuse Maternal Grandfather    Colon polyps Paternal Grandmother    Colon cancer Paternal Grandmother    Cancer Paternal Grandmother        colon   Diabetes Paternal Grandfather    Alcohol abuse Paternal Grandfather    Drug abuse Paternal Grandfather    Dementia Paternal Grandfather    ADD / ADHD Cousin    Esophageal cancer Neg Hx    Rectal cancer Neg Hx    Stomach cancer Neg Hx     Social History   Socioeconomic History   Marital status: Single    Spouse name: Not on file   Number of children: 0   Years of education: college    Highest education level: Not on file  Occupational History   Occupation: Unemployed   Tobacco Use   Smoking status: Never    Passive exposure: Past   Smokeless tobacco: Never  Vaping Use   Vaping status: Never Used  Substance and Sexual Activity   Alcohol use: No   Drug use: No   Sexual activity: Yes    Birth control/protection: I.U.D.  Other Topics Concern   Not on file  Social History Narrative   Lives with mom Cornelius Dill)    Right-handed   Caffeine : 1 cups of coffee per day (rare)   Working not working.  Disabled.    Social Drivers of Health   Financial Resource Strain:  High Risk (12/30/2022)   Overall Financial Resource Strain (CARDIA)    Difficulty of Paying Living Expenses: Hard  Food Insecurity: Food Insecurity Present (12/30/2022)   Hunger Vital Sign    Worried About Running Out of Food in the Last Year: Often true    Ran Out of Food in the Last Year: Sometimes true  Transportation Needs: No Transportation Needs (12/30/2022)   PRAPARE - Administrator, Civil Service (Medical): No    Lack of Transportation (Non-Medical): No  Physical Activity: Insufficiently Active (12/30/2022)   Exercise Vital Sign    Days of Exercise per Week: 2 days    Minutes of Exercise per Session: 10 min  Stress: Stress Concern Present (12/30/2022)   [employer] of Occupational Health - Occupational Stress Questionnaire    Feeling of Stress : Rather much  Social Connections: Socially Isolated (12/30/2022)   Social Connection and Isolation Panel [NHANES]    Frequency of Communication with Friends and Family: Once a week    Frequency of Social Gatherings with Friends and Family: Once a week    Attends Religious Services: Never    Database administrator or Organizations: No    Attends Banker Meetings: Never    Marital Status: Never married  Intimate Partner Violence: Not At Risk (12/30/2022)   Humiliation, Afraid, Rape, and Kick questionnaire  Fear of Current or Ex-Partner: No    Emotionally Abused: No    Physically Abused: No    Sexually Abused: No    Review of Systems:  All other review of systems negative except as mentioned in the HPI.  Physical Exam: Vital signs in last 24 hours: BP (!) 139/102   Pulse 83   Temp 97.8 F (36.6 C)   Ht 5\' 6"  (1.676 m)   Wt (!) 302 lb (137 kg)   SpO2 100%   BMI 48.74 kg/m  General:   Alert, NAD Lungs:  Clear .   Heart:  Regular rate and rhythm Abdomen:  Soft, nontender and nondistended. Neuro/Psych:  Alert and cooperative. Normal mood and affect. A and O x 3  Reviewed labs, radiology  imaging, old records and pertinent past GI work up  Patient is appropriate for planned procedure(s) and anesthesia in an ambulatory setting   K. Veena Korie Brabson , MD [phone]

## 2023-06-04 NOTE — Progress Notes (Signed)
 Called to room to assist during endoscopic procedure.  Patient ID and intended procedure confirmed with present staff. Received instructions for my participation in the procedure from the performing physician.

## 2023-06-04 NOTE — Op Note (Signed)
 [facility] Patient Name: Kristen [name] Procedure Date: 06/04/2023 12:38 PM MRN: [id number] Endoscopist: Sergio Dandy , MD, [id number] Age: 38 Referring MD:  Date of Birth: [birth year] Gender: Female Account #: [id number] Procedure:                Upper GI endoscopy Indications:              Dysphagia, Follow-up of gastro-esophageal reflux                            disease Medicines:                Monitored Anesthesia Care Procedure:                After obtaining informed consent, the endoscope was                            passed under direct vision. Throughout the                            procedure, the patient's blood pressure, pulse, and                            oxygen saturations were monitored continuously. The                            Olympus Scope SN [id number] was introduced through the                            mouth, and advanced to the second part of duodenum.                            The upper GI endoscopy was accomplished without                            difficulty. The patient tolerated the procedure                            well. Scope In: Scope Out: Findings:                 One benign-appearing, intrinsic moderate stenosis                            was found 33 to 34 cm from the incisors. This                            stenosis measured 1.8 cm (inner diameter) x 1 cm                            (in length). The stenosis was traversed. A TTS                            dilator was passed through the scope. Dilation with  an 18-19-20 mm x 8 cm CRE balloon dilator was                            performed to 20 mm.                           LA Grade C (one or more mucosal breaks continuous                            between tops of 2 or more mucosal folds, less than                            75% circumference) esophagitis with no bleeding was                            found 30 to 34 cm from the incisors.  Biopsies were                            obtained from the proximal and distal esophagus                            with cold forceps for histology of suspected                            eosinophilic esophagitis.                           A 3 cm hiatal hernia was present.                           The stomach was normal.                           The examined duodenum was normal. Complications:            No immediate complications. Estimated Blood Loss:     Estimated blood loss was minimal. Impression:               - Benign-appearing esophageal stenosis. Dilated.                           - LA Grade C reflux esophagitis with no bleeding.                           - 3 cm hiatal hernia.                           - Normal stomach.                           - Normal examined duodenum.                           - Biopsies were taken with a cold forceps for  evaluation of eosinophilic esophagitis. Recommendation:           - Continue present medications.                           - Await pathology results.                           - Follow an antireflux regimen.                           - Clear liquid diet today, advance to soft diet as                            tolerated tomorrow X 3 days                           - Use Prilosec (omeprazole ) 40 mg PO BID. Rx for 90                            days with 3 refills [name] [name], MD 06/04/2023 2:08:23 PM This report has been signed electronically.

## 2023-06-05 ENCOUNTER — Telehealth: Payer: Self-pay

## 2023-06-05 NOTE — Telephone Encounter (Signed)
 Follow up call to pt, lm for pt to call if having any difficulty with normal activities or eating and drinking.  Also to call if any other questions or concerns.

## 2023-06-09 LAB — SURGICAL PATHOLOGY

## 2023-06-10 ENCOUNTER — Ambulatory Visit (INDEPENDENT_AMBULATORY_CARE_PROVIDER_SITE_OTHER): Admitting: "Endocrinology

## 2023-06-10 ENCOUNTER — Encounter: Payer: Self-pay | Admitting: "Endocrinology

## 2023-06-10 VITALS — BP 122/80 | HR 62 | Ht 66.0 in | Wt 299.0 lb

## 2023-06-10 DIAGNOSIS — E88819 Insulin resistance, unspecified: Secondary | ICD-10-CM

## 2023-06-10 DIAGNOSIS — E230 Hypopituitarism: Secondary | ICD-10-CM

## 2023-06-10 DIAGNOSIS — E7800 Pure hypercholesterolemia, unspecified: Secondary | ICD-10-CM | POA: Diagnosis not present

## 2023-06-10 NOTE — Progress Notes (Signed)
 Outpatient Endocrinology Note Kristen Newcomer, MD    Kristen [name] [birth year] [id number]  Referring Provider: Lawrance Presume, MD Primary Care Provider: Lawrance Presume, MD Reason for consultation: Subjective   Assessment & Plan  Diagnoses and all orders for this visit:  Lymphocytic hypophysitis (HCC) -     ACTH  -     Cortisol -     Prolactin -     Luteinizing hormone -     Follicle stimulating hormone -     Estradiol  -     T4, free -     TSH -     Growth hormone -     Insulin -like growth factor -     Basic metabolic panel with GFR  Morbid obesity (HCC) -     Amb Referral to Bariatric Surgery -     Lipid panel  Pure hypercholesterolemia -     Lipid panel  Insulin  resistance -     Lipid panel   History of lymphocytic hypophysitis Last MRI brain without in 2021 showed persistent thickened appearance of pituitary stalk probably unchanged compared with previous MRI in 2018, no new symptoms and normal labs do no warrant repeat MRI 06/19/23: So far pituitary workup WNL including FT4, TSH, prolactin, FSH, LH, estradiol , cortisol, IGF, growth hormone, ACTH  06/10/23: ordered repeat 8 am pituitary panel   History of PCOS, has obesity complicated by hyperlipidemia and insulin  resistance, Body mass index is 48.26 kg/m. Pt did not tolerate metformin  in past Denied weight loss surgery under pt assistance. 06/10/23: Pt would like to retry referral for bariatric surgery, ordered it-denied. Requests re-referral: provided  06/10/23: Pt would like to try ozempic , will order it after lipids are done. History of pancreatitis related to gallstones per pt at 16, warned against pancreatitis, patient understands   No history of MEN syndrome/medullary thyroid  cancer/pancreatitis or pancreatic cancer in self or family Educated on risks and side effects of ozempic  including but not limited to pancreatitis, pancreatic cancer and medullary thyroid  cancer in mice. Patient verbalized  adequate understanding.     Return in about 6 weeks (around 07/22/2023).   I have reviewed current medications, nurse's notes, allergies, vital signs, past medical and surgical history, family medical history, and social history for this encounter. Counseled patient on symptoms, examination findings, lab findings, imaging results, treatment decisions and monitoring and prognosis. The patient understood the recommendations and agrees with the treatment plan. All questions regarding treatment plan were fully answered.  Kristen Newcomer, MD  06/10/23   History of Present Illness HPI  Kristen [name] is a 38 y.o. year old female who presents for follow up on hypophysitis.  Has hysterectomy scheduled in 2025 due to excess bleeding  Had been bleeding excessively, now improved after IUD Patient in interested in re-referral for bariatric surgery as well as ozempic  No other complaints Continues to have migraines, without change. No vision issues/galactorrhea   Current regimen: No pituitary related medication  Patient reports she gets bad head aches, which is the same as it was back in 2018 Sometimes gets blurry vision/flashes  Has been diagnosed with migraines with aura No galactorrhea  Stopped metformin  due to being sick Weight loss surgery was denied   Mother has history of arnold chiari malformation II  Family history is negative for pituitary tumor or other abnormalities concerning for MEN syndrome.     Per prior history: She has hyperprolactinemia (dx'ed 2018, when she presented with galactorrhea; she also had pituitary microadenoma, PCO, and  lymphocytic hypophysitis; ACTH  test was normal).  She has orange card.  She last took parlodel  in 2019.  She has heavy and prolonged menses.   03/31/2019 MRI Brain WO: Persistent thickened appearance of pituitary stalk probably unchanged compared with previous MRI from 11/01/2016.   11/01/2016 MRI Brain W and WO: The pituitary stalk is  thickened. This could be incidental, but could also occur with inflammatory disorder such as lymphocytic infundibulo-hypophysitis, Langerhans cell histiocytosis, neurosarcoidosis another inflammatory etiologies. The changes are stable when compared to the MRI dated 07/14/2016 making a neoplastic disorder less likely.   Physical Exam  BP 122/80   Pulse 62   Ht 5\' 6"  (1.676 m)   Wt 299 lb (135.6 kg)   SpO2 99%   BMI 48.26 kg/m    Constitutional: well developed, well nourished, mild supraclavicular fat, mild dorsocervical hump Head: normocephalic, atraumatic, non cushingoid  Eyes: sclera anicteric, no redness Neck: supple Lungs: normal respiratory effort Neurology: alert and oriented Skin: dry, no appreciable rashes Musculoskeletal: no appreciable defects, no purple color stretch marks Psychiatric: normal mood and affect   Current Medications Patient's Medications  New Prescriptions   No medications on file  Previous Medications   ALBUTEROL  (VENTOLIN  HFA) 108 (90 BASE) MCG/ACT INHALER    Inhale 2 puffs into the lungs every 6 (six) hours as needed for wheezing or shortness of breath.   BENZOCAINE -RESORCINOL (VAGISIL EX)    Apply 1 application. topically as needed.   BUSPIRONE  (BUSPAR ) 10 MG TABLET    Take 1 tablet (10 mg total) by mouth 3 (three) times daily.   CALCIUM CARB-CHOLECALCIFEROL (CALCIUM 500 + D PO)    Take 1 tablet by mouth daily at 6 (six) AM.   CALCIUM-PHOSPHORUS-VITAMIN D (CALCIUM/VITAMIN D3/ADULT GUMMY) 250-100-500 MG-MG-UNIT CHEW    Chew by mouth daily at 6 (six) AM.   COLESTIPOL  (COLESTID ) 1 G TABLET    Take 2 tablets (2 g total) by mouth 3 (three) times daily.   DICLOFENAC  SODIUM (VOLTAREN ) 1 % GEL    Apply 2 g topically 4 (four) times daily.   GABAPENTIN  (NEURONTIN ) 300 MG CAPSULE    Take 1 capsule (300 mg total) by mouth at bedtime.   HYDROXYZINE  (ATARAX ) 25 MG TABLET    Take 1 tablet (25 mg total) by mouth 4 (four) times daily.   IPRATROPIUM-ALBUTEROL  (DUONEB)  0.5-2.5 (3) MG/3ML SOLN    Take 3 mLs by nebulization every 6 (six) hours as needed.   LOPERAMIDE (IMODIUM A-D) 2 MG TABLET    Take 2 mg by mouth 4 (four) times daily as needed for diarrhea or loose stools.   METOPROLOL  TARTRATE (LOPRESSOR ) 25 MG TABLET    Take 1 tablet (25 mg total) by mouth 2 (two) times daily.   NYSTATIN  (MYCOSTATIN /NYSTOP ) POWDER    Apply 1 Application topically 2 (two) times daily.   OMEPRAZOLE  (PRILOSEC) 40 MG CAPSULE    Take 1 capsule (40 mg total) by mouth daily before breakfast.   OMEPRAZOLE  (PRILOSEC) 40 MG CAPSULE    Take 1 capsule (40 mg total) by mouth in the morning and at bedtime.   PROPRANOLOL  (INDERAL ) 10 MG TABLET    Take 2 tablets (20 mg total) by mouth 3 (three) times daily as needed (palpitations).   QUETIAPINE  (SEROQUEL ) 100 MG TABLET    Take 1 tablet (100 mg total) by mouth at bedtime.   SERTRALINE  (ZOLOFT ) 100 MG TABLET    Take 2 tablets (200 mg total) by mouth at bedtime.   SUMATRIPTAN  (IMITREX )  50 MG TABLET    Take 1 tablet at start of headache. May repeat in 2 hours if headache persists or recurs. Max 2 tablets in 24 hours.   TRAZODONE  (DESYREL ) 100 MG TABLET    Take 1 tablet (100 mg total) by mouth at bedtime as needed for sleep.  Modified Medications   No medications on file  Discontinued Medications   No medications on file    Allergies Allergies  Allergen Reactions   Latex Hives and Swelling   Mucinex [Guaifenesin Er] Hives, Itching and Swelling   Other Anaphylaxis and Nausea Only    Pickles - throat swelling and nausea    Penicillins Swelling    Did it involve swelling of the face/tongue/throat, SOB, or low BP? Yes Did it involve sudden or severe rash/hives, skin peeling, or any reaction on the inside of your mouth or nose? No Did you need to seek medical attention at a hospital or doctor's office? Yes When did it last happen?      >10 years ago If all above answers are "NO", may proceed with cephalosporin use.    Contrast Media  [Iodinated Contrast Media] Nausea And Vomiting   Multihance  [Gadobenate] Nausea And Vomiting    Past Medical History Past Medical History:  Diagnosis Date   Anxiety 2013   treated at [facility] by Terresa Ferry    Asthma 1989   no hospitalization, or intubation, previously on advair and singulair . asthma worsening.  Follows w/ Dr. Concetta Dee @ [place]. Per pt on 01/17/23, she only uses inhaler if she is doing strenous exercise or during the change of seasons.   Chronic diarrhea    Follows w/ [facility].   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   Dysfunctional uterine bleeding    Dysrhythmia 2010   PVC's, palpitations   Ehlers-Danlos syndrome    Follows with Dr. Luevenia Saha, Sports Medicine and PCP.   Essential hypertension 03/12/2019   Follows w/ [facility] cardiology.   GERD (gastroesophageal reflux disease)    History of hiatal hernia    History of suicidal ideation    IBS (irritable bowel syndrome)    Insulin  resistance    Follows w/ endocrinology.   Kyphosis of thoracic region 2004   painful, runs in dad side    Lymphocytic hypophysitis (HCC)    Follows w/ endocrinology.   Migraine    Follows w/ PCP.   Orthostatic hypotension    Follows w/ [facility], Slater Duncan, NP.   Pancreatitis due to biliary obstruction 2003   PCOS (polycystic ovarian syndrome) 2014   facial hair, irregular periods, never been pregnant    PTSD (post-traumatic stress disorder)    Follows w/ Behavioral Health.   Sleep apnea    not currently wearing Cpap   Social phobia    Follows w/ Behavioral Health.    Past Surgical History Past Surgical History:  Procedure Laterality Date   BIOPSY  06/11/2021   Procedure: BIOPSY;  Surgeon: Sergio Dandy, MD;  Location: [facility];  Service: Gastroenterology;;   BIOPSY  02/25/2023   Procedure: BIOPSY;  Surgeon: Sergio Dandy, MD;  Location: [facility];  Service: Gastroenterology;;   BLADDER SURGERY     bladder stretched as a child   CHOLECYSTECTOMY  2003   COLONOSCOPY WITH PROPOFOL  N/A 06/11/2021   Procedure: COLONOSCOPY WITH PROPOFOL ;  Surgeon: Nandigam, Kavitha V, MD;  Location: [facility];  Service: Gastroenterology;  Laterality: N/A;   DILATATION & CURETTAGE/HYSTEROSCOPY WITH MYOSURE N/A 12/14/2019   Procedure: DILATATION & CURETTAGE/HYSTEROSCOPY WITH Polypectomy with MYOSURE;  Surgeon: Julianne Octave, MD;  Location: [facility];  Service: Gynecology;  Laterality: N/A;   ESOPHAGOGASTRODUODENOSCOPY (EGD) WITH PROPOFOL  N/A 02/25/2023   Procedure: ESOPHAGOGASTRODUODENOSCOPY (EGD) WITH PROPOFOL ;  Surgeon: Sergio Dandy, MD;  Location: [facility];  Service: Gastroenterology;  Laterality: N/A;   INTRAUTERINE DEVICE (IUD) INSERTION N/A 01/21/2023   Procedure: INTRAUTERINE DEVICE (IUD) INSERTION(MIRENA );  Surgeon: Tresia Fruit, MD;  Location: [facility];  Service: Gynecology;  Laterality: N/A;   MOHS SURGERY Left 11/2022   Right side and left leg   OPEN REDUCTION INTERNAL FIXATION (ORIF) PROXIMAL PHALANX Right 07/23/2013   Procedure: OPEN TREATMENT RIGHT RING FINGER, PROXIMAL INTERPHALANGEAL/JOINT FRACTURE DISLOCATION;  Surgeon: Sheryl Donna, MD;  Location: [facility];  Service: Orthopedics;  Laterality: Right;   TONSILLECTOMY     and adenoidectomy    Family History family history includes ADD / ADHD in her cousin; Alcohol abuse in her maternal aunt, maternal grandfather, paternal aunt, and paternal grandfather; Alzheimer's disease in her maternal grandmother; Anxiety disorder in her mother; Arnold-Chiari malformation in her mother; Arthritis in her mother; Asthma in her mother; Bipolar disorder in her father; Bone cancer (age of onset: 26) in her maternal grandfather; COPD in her father and mother; Cancer in her paternal grandmother; Colon cancer in her mother and  paternal grandmother; Colon polyps in her paternal grandmother; Colonic polyp in her mother; Congestive Heart Failure in her father; Dementia in her maternal grandmother and paternal grandfather; Depression in her father, maternal aunt, and paternal aunt; Diabetes in her maternal grandmother and paternal grandfather; Drug abuse in her maternal grandfather and paternal grandfather; Eczema in her mother; Hypertension in her father, maternal grandmother, and mother; Multiple myeloma in her maternal grandfather; OCD in her maternal grandmother and mother; Osteoporosis in her mother; Pancreatic cancer in her maternal aunt; Peripheral Artery Disease in her mother; Restless legs syndrome in her mother; Scoliosis in her father; Squamous cell carcinoma in her mother; Stroke in her mother.  Social History Social History   Socioeconomic History   Marital status: Single    Spouse name: Not on file   Number of children: 0   Years of education: college    Highest education level: Not on file  Occupational History   Occupation: Unemployed   Tobacco Use   Smoking status: Never    Passive exposure: Past   Smokeless tobacco: Never  Vaping Use   Vaping status: Never Used  Substance and Sexual Activity   Alcohol use: No   Drug use: No   Sexual activity: Yes    Birth control/protection: I.U.D.  Other Topics Concern   Not on file  Social History Narrative   Lives with mom Cornelius Dill)    Right-handed   Caffeine : 1 cups of coffee per day (rare)   Working not working.  Disabled.    Social Drivers of Health   Financial Resource Strain: High Risk (12/30/2022)   Overall Financial Resource Strain (CARDIA)    Difficulty of Paying Living Expenses: Hard  Food Insecurity: Food Insecurity Present (12/30/2022)   Hunger Vital Sign    Worried About Running Out of Food in the Last Year: Often true    Ran Out of Food in the Last Year: Sometimes true  Transportation Needs: No Transportation Needs (12/30/2022)  PRAPARE - Administrator, Civil Service (Medical): No    Lack of Transportation (Non-Medical): No  Physical Activity: Insufficiently Active (12/30/2022)   Exercise Vital Sign    Days of Exercise per Week: 2 days    Minutes of Exercise per Session: 10 min  Stress: Stress Concern Present (12/30/2022)   [employer] of Occupational Health - Occupational Stress Questionnaire    Feeling of Stress : Rather much  Social Connections: Socially Isolated (12/30/2022)   Social Connection and Isolation Panel [NHANES]    Frequency of Communication with Friends and Family: Once a week    Frequency of Social Gatherings with Friends and Family: Once a week    Attends Religious Services: Never    Database administrator or Organizations: No    Attends Banker Meetings: Never    Marital Status: Never married  Intimate Partner Violence: Not At Risk (12/30/2022)   Humiliation, Afraid, Rape, and Kick questionnaire    Fear of Current or Ex-Partner: No    Emotionally Abused: No    Physically Abused: No    Sexually Abused: No   Component     Latest Ref Rng 06/19/2022  Sodium     135 - 145 mEq/L 139   Potassium     3.5 - 5.1 mEq/L 3.5   Chloride     96 - 112 mEq/L 100   CO2     19 - 32 mEq/L 30   Glucose     70 - 99 mg/dL 782 (H)   BUN     6 - 23 mg/dL 12   Creatinine     9.56 - 1.20 mg/dL 2.13   GFR     >08.65 mL/min 69.70   Calcium     8.4 - 10.5 mg/dL 9.5   LH     mIU/mL 7.84   Prolactin     ng/mL 10.7   Estradiol      pg/mL 69   FSH     mIU/ML 5.4   Cortisol, Plasma     ug/dL 69.6     Component Ref Range & Units 1 d ago (06/19/22) 1 yr ago (11/01/20) 1 yr ago (07/14/20) 5 yr ago (09/18/16) 6 yr ago (06/20/16) 6 yr ago (05/24/16)  Prolactin ng/mL 10.7 15.3 CM 20.5 R 15.9 CM 32.9 High  R 27.6 High  R  Comment:             Reference Range    Lab Results  Component Value Date   CHOL 177 06/18/2022   Lab Results  Component Value Date   HDL 42.40  06/18/2022   Lab Results  Component Value Date   LDLCALC 109 (H) 06/18/2022   Lab Results  Component Value Date   TRIG 128.0 06/18/2022   Lab Results  Component Value Date   CHOLHDL 4 06/18/2022   Lab Results  Component Value Date   CREATININE 0.90 02/09/2023   Lab Results  Component Value Date   GFR 69.70 06/19/2022      Component Value Date/Time   NA 138 02/09/2023 1857   NA 141 07/14/2020 0937   K 3.6 02/09/2023 1857   CL 107 02/09/2023 1857   CO2 22 02/09/2023 1857   GLUCOSE 91 02/09/2023 1857   BUN 11 02/09/2023 1857   BUN 12 07/14/2020 0937   CREATININE 0.90 02/09/2023 1857   CREATININE 1.09 03/25/2014 1730   CALCIUM 8.7 (L) 02/09/2023 1857   PROT 6.6 02/09/2023 1857  PROT 6.4 07/22/2017 1339   ALBUMIN 3.5 02/09/2023 1857   ALBUMIN 4.2 07/22/2017 1339   AST 26 02/09/2023 1857   ALT 29 02/09/2023 1857   ALKPHOS 116 02/09/2023 1857   BILITOT 0.6 02/09/2023 1857   BILITOT 0.4 07/22/2017 1339   GFRNONAA >60 02/09/2023 1857   GFRNONAA 70 03/25/2014 1730   GFRAA >60 04/15/2019 0624   GFRAA 80 03/25/2014 1730      Latest Ref Rng & Units 02/09/2023    6:57 PM 01/21/2023    8:12 AM 06/19/2022    8:14 AM  BMP  Glucose 70 - 99 mg/dL 91  95  161   BUN 6 - 20 mg/dL 11  12  12    Creatinine 0.44 - 1.00 mg/dL 0.96  0.45  4.09   Sodium 135 - 145 mmol/L 138  140  139   Potassium 3.5 - 5.1 mmol/L 3.6  3.5  3.5   Chloride 98 - 111 mmol/L 107  103  100   CO2 22 - 32 mmol/L 22   30   Calcium 8.9 - 10.3 mg/dL 8.7   9.5        Component Value Date/Time   WBC 7.7 02/09/2023 1857   RBC 4.18 02/09/2023 1857   HGB 12.1 02/09/2023 1857   HGB 13.3 06/27/2022 1434   HCT 36.7 02/09/2023 1857   HCT 39.9 06/27/2022 1434   PLT 212 02/09/2023 1857   PLT 278 06/27/2022 1434   MCV 87.8 02/09/2023 1857   MCV 85 06/27/2022 1434   MCH 28.9 02/09/2023 1857   MCHC 33.0 02/09/2023 1857   RDW 13.5 02/09/2023 1857   RDW 13.5 06/27/2022 1434   LYMPHSABS 2.6 02/09/2023 1857    LYMPHSABS 3.0 06/27/2022 1434   MONOABS 0.4 02/09/2023 1857   EOSABS 0.4 02/09/2023 1857   EOSABS 0.2 06/27/2022 1434   BASOSABS 0.0 02/09/2023 1857   BASOSABS 0.1 06/27/2022 1434   Lab Results  Component Value Date   TSH 3.01 06/18/2022   TSH 2.99 11/01/2020   TSH 4.628 (H) 04/15/2019   FREET4 1.05 06/18/2022   FREET4 0.84 11/01/2020   FREET4 1.07 09/18/2016         Parts of this note may have been dictated using voice recognition software. There may be variances in spelling and vocabulary which are unintentional. Not all errors are proofread. Please notify the [facility] if any discrepancies are noted or if the meaning of any statement is not clear.

## 2023-06-13 ENCOUNTER — Telehealth (INDEPENDENT_AMBULATORY_CARE_PROVIDER_SITE_OTHER): Payer: MEDICAID | Admitting: Psychiatry

## 2023-06-13 ENCOUNTER — Encounter (HOSPITAL_COMMUNITY): Payer: Self-pay | Admitting: Psychiatry

## 2023-06-13 ENCOUNTER — Other Ambulatory Visit: Payer: Self-pay

## 2023-06-13 ENCOUNTER — Encounter (HOSPITAL_COMMUNITY): Payer: Self-pay

## 2023-06-13 DIAGNOSIS — F331 Major depressive disorder, recurrent, moderate: Secondary | ICD-10-CM

## 2023-06-13 DIAGNOSIS — F4010 Social phobia, unspecified: Secondary | ICD-10-CM | POA: Diagnosis not present

## 2023-06-13 MED ORDER — HYDROXYZINE HCL 25 MG PO TABS
25.0000 mg | ORAL_TABLET | Freq: Four times a day (QID) | ORAL | 3 refills | Status: DC
  Filled 2023-06-13: qty 120, 30d supply, fill #0
  Filled 2023-08-15 (×2): qty 120, 30d supply, fill #1

## 2023-06-13 MED ORDER — TRAZODONE HCL 100 MG PO TABS
100.0000 mg | ORAL_TABLET | Freq: Every evening | ORAL | 3 refills | Status: DC | PRN
  Filled 2023-06-13: qty 30, 30d supply, fill #0

## 2023-06-13 MED ORDER — SERTRALINE HCL 100 MG PO TABS
200.0000 mg | ORAL_TABLET | Freq: Every day | ORAL | 3 refills | Status: DC
  Filled 2023-06-13: qty 60, 30d supply, fill #0
  Filled 2023-08-15: qty 60, 30d supply, fill #1

## 2023-06-13 MED ORDER — QUETIAPINE FUMARATE 100 MG PO TABS
100.0000 mg | ORAL_TABLET | Freq: Every day | ORAL | 3 refills | Status: DC
  Filled 2023-06-13: qty 30, 30d supply, fill #0
  Filled 2023-08-15: qty 30, 30d supply, fill #1

## 2023-06-13 MED ORDER — BUSPIRONE HCL 10 MG PO TABS
10.0000 mg | ORAL_TABLET | Freq: Three times a day (TID) | ORAL | 3 refills | Status: DC
  Filled 2023-06-13: qty 90, 30d supply, fill #0
  Filled 2023-08-15 (×2): qty 90, 30d supply, fill #1

## 2023-06-13 NOTE — Progress Notes (Signed)
 [facility] MD/PA/NP OP Progress Note Virtual Visit via Video Note  I connected with Kristen [name] on 06/13/23 at  9:00 AM EDT by a video enabled telemedicine application and verified that I am speaking with the correct person using two identifiers.  Location: Patient: Home Provider: Clinic   I discussed the limitations of evaluation and management by telemedicine and the availability of in person appointments. The patient expressed understanding and agreed to proceed.  I provided 30 minutes of non-face-to-face time during this encounter.      06/13/2023 9:16 AM Kristen [name]  MRN:  [id number]  Chief Complaint: "I am recovering from covid"  HPI:  38 year old female seen today for follow up psychiatric evaluation.  She has a psychiatric history of social phobia, ADD, anxiety, depression, and PTSD.  She is currently being managed on Seroquel   100 mg nightly, Buspar  10 mg three times daily,  Zoloft  200 mg daily, trazodone  50 mg nightly as needed, hydroxyzine  25 mg 4 times a day as needed, gabapentin  300 mg nightly (prescribed by PCP), and propanolol 20 mg 2 times daily (recives from PCP).  She notes that her medications are effective in managing her psychiatric conditions.   Today patient was well-groomed, pleasant, cooperative, engaged in conversation, and maintained eye contact.  She informed provider that things are going pretty well.  She notes that she has been more active in the gym and continues to lose weight.  She notes that since her last visit she has lost 15 pounds.  She also notes that she is limiting her stress by reducing her media intake.  Patient notes that she tries to stay away from negative politics.  Since her last visit she notes that she had to put her cat down but reports that recently her mother got a new kitten.    Mentally she notes that she is in a better place than she was at her prior visit.  Today provider conducted a GAD-7 of patient scored a 7.  Provider  also conducted PHQ-9 and patient scored an 7, at her last visit she scored a 18.  Today she denies SI/HI/VAH, mania, or paranoia.  She endorses adequate sleep and appetite   No medication changes were made today.  Patient agreeable to continue medications as prescribed.  No other concerns noted at this time.      Visit Diagnosis:    ICD-10-CM   1. Social phobia  F40.10 sertraline  (ZOLOFT ) 100 MG tablet    traZODone  (DESYREL ) 100 MG tablet    busPIRone  (BUSPAR ) 10 MG tablet    hydrOXYzine  (ATARAX ) 25 MG tablet    2. Moderate episode of recurrent major depressive disorder (HCC)  F33.1 sertraline  (ZOLOFT ) 100 MG tablet    QUEtiapine  (SEROQUEL ) 100 MG tablet    traZODone  (DESYREL ) 100 MG tablet    busPIRone  (BUSPAR ) 10 MG tablet           Past Psychiatric History: social phobia, ADD, anxiety, depression, and PTSD.  Past Medical History:  Past Medical History:  Diagnosis Date   Anxiety 2013   treated at [facility] by Terresa Ferry    Asthma 1989   no hospitalization, or intubation, previously on advair and singulair . asthma worsening.  Follows w/ Dr. Concetta Dee @ [facility]. Per pt on 01/17/23, she only uses inhaler if she is doing strenous exercise or during the change of seasons.   Chronic diarrhea    Follows w/ [facility].   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   Dysfunctional uterine bleeding    Dysrhythmia 2010   PVC's, palpitations   Ehlers-Danlos syndrome    Follows with Dr. Luevenia Saha, Sports Medicine and PCP.   Essential hypertension 03/12/2019   Follows w/ [facility] cardiology.   GERD (gastroesophageal reflux disease)    History of hiatal hernia    History of suicidal ideation    IBS (irritable bowel syndrome)    Insulin  resistance    Follows w/ endocrinology.   Kyphosis of thoracic region 2004   painful, runs in dad side    Lymphocytic hypophysitis (HCC)     Follows w/ endocrinology.   Migraine    Follows w/ PCP.   Orthostatic hypotension    Follows w/ [facility], Slater Duncan, NP.   Pancreatitis due to biliary obstruction 2003   PCOS (polycystic ovarian syndrome) 2014   facial hair, irregular periods, never been pregnant    PTSD (post-traumatic stress disorder)    Follows w/ Behavioral Health.   Sleep apnea    not currently wearing Cpap   Social phobia    Follows w/ Behavioral Health.    Past Surgical History:  Procedure Laterality Date   BIOPSY  06/11/2021   Procedure: BIOPSY;  Surgeon: Sergio Dandy, MD;  Location: [facility];  Service: Gastroenterology;;   BIOPSY  02/25/2023   Procedure: BIOPSY;  Surgeon: Sergio Dandy, MD;  Location: [facility];  Service: Gastroenterology;;   BLADDER SURGERY     bladder stretched as a child   CHOLECYSTECTOMY  2003   COLONOSCOPY WITH PROPOFOL  N/A 06/11/2021   Procedure: COLONOSCOPY WITH PROPOFOL ;  Surgeon: Sergio Dandy, MD;  Location: [facility];  Service: Gastroenterology;  Laterality: N/A;   DILATATION & CURETTAGE/HYSTEROSCOPY WITH MYOSURE N/A 12/14/2019   Procedure: DILATATION & CURETTAGE/HYSTEROSCOPY WITH Polypectomy with MYOSURE;  Surgeon: Julianne Octave, MD;  Location: [facility];  Service: Gynecology;  Laterality: N/A;   ESOPHAGOGASTRODUODENOSCOPY (EGD) WITH PROPOFOL  N/A 02/25/2023   Procedure: ESOPHAGOGASTRODUODENOSCOPY (EGD) WITH PROPOFOL ;  Surgeon: Sergio Dandy, MD;  Location: [facility];  Service: Gastroenterology;  Laterality: N/A;   INTRAUTERINE DEVICE (IUD) INSERTION N/A 01/21/2023   Procedure: INTRAUTERINE DEVICE (IUD) INSERTION(MIRENA );  Surgeon: Tresia Fruit, MD;  Location: [facility];  Service: Gynecology;  Laterality: N/A;   MOHS SURGERY Left 11/2022   Right side and left leg   OPEN REDUCTION INTERNAL FIXATION (ORIF) PROXIMAL PHALANX Right 07/23/2013   Procedure: OPEN TREATMENT RIGHT RING FINGER, PROXIMAL  INTERPHALANGEAL/JOINT FRACTURE DISLOCATION;  Surgeon: Sheryl Donna, MD;  Location:  SURGERY CENTER;  Service: Orthopedics;  Laterality: Right;   TONSILLECTOMY     and adenoidectomy    Family Psychiatric History: Mother PTSD, Father Bipolar disorder and PTSD  Family History:  Family History  Problem Relation Age of Onset   Colon cancer Mother    Hypertension Mother    Arnold-Chiari malformation Mother    Restless legs syndrome Mother    Osteoporosis Mother    COPD Mother    Asthma Mother    Squamous cell carcinoma Mother        skin    Eczema Mother    Arthritis Mother    Peripheral Artery Disease Mother    Anxiety disorder Mother    OCD Mother    Colonic polyp Mother    Stroke Mother    Hypertension Father  Scoliosis Father    Bipolar disorder Father    Congestive Heart Failure Father    COPD Father    Depression Father    Alcohol abuse Maternal Aunt    Depression Maternal Aunt    Pancreatic cancer Maternal Aunt    Alcohol abuse Paternal Aunt    Depression Paternal Aunt    Diabetes Maternal Grandmother    Hypertension Maternal Grandmother    Dementia Maternal Grandmother    OCD Maternal Grandmother    Alzheimer's disease Maternal Grandmother    Bone cancer Maternal Grandfather 32       bone marrow    Multiple myeloma Maternal Grandfather    Alcohol abuse Maternal Grandfather    Drug abuse Maternal Grandfather    Colon polyps Paternal Grandmother    Colon cancer Paternal Grandmother    Cancer Paternal Grandmother        colon   Diabetes Paternal Grandfather    Alcohol abuse Paternal Grandfather    Drug abuse Paternal Grandfather    Dementia Paternal Grandfather    ADD / ADHD Cousin    Esophageal cancer Neg Hx    Rectal cancer Neg Hx    Stomach cancer Neg Hx     Social History:  Social History   Socioeconomic History   Marital status: Single    Spouse name: Not on file   Number of children: 0   Years of education: college    Highest  education level: Not on file  Occupational History   Occupation: Unemployed   Tobacco Use   Smoking status: Never    Passive exposure: Past   Smokeless tobacco: Never  Vaping Use   Vaping status: Never Used  Substance and Sexual Activity   Alcohol use: No   Drug use: No   Sexual activity: Yes    Birth control/protection: I.U.D.  Other Topics Concern   Not on file  Social History Narrative   Lives with mom Cornelius Dill)    Right-handed   Caffeine : 1 cups of coffee per day (rare)   Working not working.  Disabled.    Social Drivers of Health   Financial Resource Strain: High Risk (12/30/2022)   Overall Financial Resource Strain (CARDIA)    Difficulty of Paying Living Expenses: Hard  Food Insecurity: Food Insecurity Present (12/30/2022)   Hunger Vital Sign    Worried About Running Out of Food in the Last Year: Often true    Ran Out of Food in the Last Year: Sometimes true  Transportation Needs: No Transportation Needs (12/30/2022)   PRAPARE - Administrator, Civil Service (Medical): No    Lack of Transportation (Non-Medical): No  Physical Activity: Insufficiently Active (12/30/2022)   Exercise Vital Sign    Days of Exercise per Week: 2 days    Minutes of Exercise per Session: 10 min  Stress: Stress Concern Present (12/30/2022)   [employer] of Occupational Health - Occupational Stress Questionnaire    Feeling of Stress : Rather much  Social Connections: Socially Isolated (12/30/2022)   Social Connection and Isolation Panel [NHANES]    Frequency of Communication with Friends and Family: Once a week    Frequency of Social Gatherings with Friends and Family: Once a week    Attends Religious Services: Never    Database administrator or Organizations: No    Attends Banker Meetings: Never    Marital Status: Never married    Allergies:  Allergies  Allergen Reactions   Latex Hives  and Swelling   Mucinex [Guaifenesin Er] Hives, Itching and Swelling    Other Anaphylaxis and Nausea Only    Pickles - throat swelling and nausea    Penicillins Swelling    Did it involve swelling of the face/tongue/throat, SOB, or low BP? Yes Did it involve sudden or severe rash/hives, skin peeling, or any reaction on the inside of your mouth or nose? No Did you need to seek medical attention at a hospital or doctor's office? Yes When did it last happen?      >10 years ago If all above answers are "NO", may proceed with cephalosporin use.    Contrast Media [Iodinated Contrast Media] Nausea And Vomiting   Multihance  [Gadobenate] Nausea And Vomiting    Metabolic Disorder Labs: Lab Results  Component Value Date   HGBA1C 5.2 06/18/2022   MPG 93.93 04/15/2019   Lab Results  Component Value Date   PROLACTIN 10.7 06/19/2022   PROLACTIN 15.3 11/01/2020   Lab Results  Component Value Date   CHOL 177 06/18/2022   TRIG 128.0 06/18/2022   HDL 42.40 06/18/2022   CHOLHDL 4 06/18/2022   VLDL 25.6 06/18/2022   LDLCALC 109 (H) 06/18/2022   LDLCALC 134 (H) 04/15/2019   Lab Results  Component Value Date   TSH 3.01 06/18/2022   TSH 2.99 11/01/2020    Therapeutic Level Labs: No results found for: "LITHIUM" No results found for: "VALPROATE" No results found for: "CBMZ"  Current Medications: Current Outpatient Medications  Medication Sig Dispense Refill   albuterol  (VENTOLIN  HFA) 108 (90 Base) MCG/ACT inhaler Inhale 2 puffs into the lungs every 6 (six) hours as needed for wheezing or shortness of breath. 18 g 6   Benzocaine -Resorcinol (VAGISIL EX) Apply 1 application. topically as needed.     busPIRone  (BUSPAR ) 10 MG tablet Take 1 tablet (10 mg total) by mouth 3 (three) times daily. 90 tablet 3   Calcium Carb-Cholecalciferol (CALCIUM 500 + D PO) Take 1 tablet by mouth daily at 6 (six) AM.     Calcium-Phosphorus-Vitamin D (CALCIUM/VITAMIN D3/ADULT GUMMY) 250-100-500 MG-MG-UNIT CHEW Chew by mouth daily at 6 (six) AM.     colestipol  (COLESTID ) 1 g tablet  Take 2 tablets (2 g total) by mouth 3 (three) times daily. 180 tablet 2   diclofenac  Sodium (VOLTAREN ) 1 % GEL Apply 2 g topically 4 (four) times daily. 100 g 2   gabapentin  (NEURONTIN ) 300 MG capsule Take 1 capsule (300 mg total) by mouth at bedtime. 90 capsule 1   hydrOXYzine  (ATARAX ) 25 MG tablet Take 1 tablet (25 mg total) by mouth 4 (four) times daily. 120 tablet 3   ipratropium-albuterol  (DUONEB) 0.5-2.5 (3) MG/3ML SOLN Take 3 mLs by nebulization every 6 (six) hours as needed. 90 mL 1   loperamide (IMODIUM A-D) 2 MG tablet Take 2 mg by mouth 4 (four) times daily as needed for diarrhea or loose stools.     metoprolol  tartrate (LOPRESSOR ) 25 MG tablet Take 1 tablet (25 mg total) by mouth 2 (two) times daily. 180 tablet 1   nystatin  (MYCOSTATIN /NYSTOP ) powder Apply 1 Application topically 2 (two) times daily. 15 g 4   omeprazole  (PRILOSEC) 40 MG capsule Take 1 capsule (40 mg total) by mouth daily before breakfast. 90 capsule 0   omeprazole  (PRILOSEC) 40 MG capsule Take 1 capsule (40 mg total) by mouth in the morning and at bedtime. 90 capsule 3   propranolol  (INDERAL ) 10 MG tablet Take 2 tablets (20 mg total) by mouth 3 (three)  times daily as needed (palpitations). 60 tablet 11   QUEtiapine  (SEROQUEL ) 100 MG tablet Take 1 tablet (100 mg total) by mouth at bedtime. 30 tablet 3   sertraline  (ZOLOFT ) 100 MG tablet Take 2 tablets (200 mg total) by mouth at bedtime. 60 tablet 3   SUMAtriptan  (IMITREX ) 50 MG tablet Take 1 tablet at start of headache. May repeat in 2 hours if headache persists or recurs. Max 2 tablets in 24 hours. 10 tablet 3   traZODone  (DESYREL ) 100 MG tablet Take 1 tablet (100 mg total) by mouth at bedtime as needed for sleep. 30 tablet 3   No current facility-administered medications for this visit.     Musculoskeletal: Strength & Muscle Tone: within normal limits and telehealth visit Gait & Station: normal, telehealth visit Patient leans: N/A  Psychiatric Specialty  Exam: Review of Systems  There were no vitals taken for this visit.There is no height or weight on file to calculate BMI.  General Appearance: Well Groomed  Eye Contact:  Good  Speech:  Clear and Coherent and Normal Rate  Volume:  Normal  Mood:  Euthymic  Affect:  Appropriate and Congruent  Thought Process:  Coherent, Goal Directed and Linear  Orientation:  Full (Time, Place, and Person)  Thought Content: WDL and Logical   Suicidal Thoughts:  No  Homicidal Thoughts:  No  Memory:  Immediate;   Good Recent;   Good Remote;   Good  Judgement:  Good  Insight:  Good  Psychomotor Activity:  Normal  Concentration:  Concentration: Good and Attention Span: Good  Recall:  Good  Fund of Knowledge: Good  Language: Good  Akathisia:  No  Handed:  Right  AIMS (if indicated): Not done  Assets:  Communication Skills Desire for Improvement Financial Resources/Insurance Housing Social Support  ADL's:  Intact  Cognition: WNL  Sleep:  Good   Screenings: AIMS    Flowsheet Row [facility] (Dis[facility]  AIMS Total Score 0      AUDIT    Flowsheet Row Admission (Discharged) from [facility]  Alcohol Use Disorder Identification Test Final Score (AUDIT) 0      GAD-7    Flowsheet Row Video Visit from 06/13/2023 in [facility] Office Visit from 05/01/2023 in [facility] [place] - A Dept Of [place]. [facility] Office Visit from [facility]/2023 [facility] Video Visit from 01/17/2023 in [facility] Office Visit from 12/30/2022 in [facility] [place] - A Dept Of [place]. [facility]  Total GAD-7 Score 7 12 11 18 16       PHQ2-9    Flowsheet Row Video Visit from 06/13/2023 in [facility] Office Visit from 05/01/2023 in [facility] Dept Of [place]. [facility] Office Visit from 03/25/2023 in [facility] Video Visit from 03/21/2023 in [facility] Video Visit from 01/17/2023 in [facility]  PHQ-2 Total Score 2 3 3 4 3   PHQ-9 Total Score 7 9 12 18 22       Flowsheet Row ED from 05/17/2023 in [facility] Emergency Department at [facility] Video Visit from 03/21/2023 in [facility] UC from  03/07/2023 in [facility])  C-SSRS RISK CATEGORY No Risk Error: Q7 should not be populated when Q6 is No No Risk        Assessment and Plan: Patient notes that she is doing well on her current medication regimen.  No medication changes were made today.  Patient agreeable to continue medications as prescribed  1. Social phobia  Continue- busPIRone  (BUSPAR ) 10 MG tablet; Take 1 tablet (10 mg total) by mouth 3 (three) times daily.  Dispense: 90 tablet; Refill: 3 Continue- hydrOXYzine  (ATARAX ) 25 MG tablet; Take 1 tablet (25 mg total) by mouth 4 (four) times daily.  Dispense: 120 tablet; Refill: 3 Continue- traZODone  (DESYREL ) 100 MG tablet; Take 1 tablet (100 mg total) by mouth at bedtime as needed for sleep.  Dispense: 30 tablet; Refill: 3 Continue- sertraline  (ZOLOFT ) 100 MG tablet; Take 2 tablets (200 mg total) by mouth at bedtime.  Dispense: 60 tablet; Refill: 3  2. Moderate episode of recurrent major depressive disorder (HCC)  Continue- busPIRone  (BUSPAR ) 10 MG tablet; Take 1 tablet (10 mg total) by mouth 3 (three) times daily.  Dispense: 90 tablet; Refill: 3 Continue- traZODone  (DESYREL ) 100 MG tablet; Take 1 tablet (100 mg total) by mouth at bedtime as needed for sleep.  Dispense: 30 tablet; Refill: 3 Continue- sertraline  (ZOLOFT ) 100 MG tablet; Take 2 tablets (200  mg total) by mouth at bedtime.  Dispense: 60 tablet; Refill: 3 Contnue- QUEtiapine  (SEROQUEL ) 100 MG tablet; Take 1 tablet (100 mg total) by mouth at bedtime.  Dispense: 30 tablet; Refill: 3      Follow-up in  3months Follow-up therapy Arlyne Bering, NP 06/13/2023, 9:16 AM

## 2023-06-16 ENCOUNTER — Other Ambulatory Visit: Payer: Self-pay

## 2023-06-16 ENCOUNTER — Other Ambulatory Visit: Payer: Self-pay | Admitting: Internal Medicine

## 2023-06-16 ENCOUNTER — Other Ambulatory Visit

## 2023-06-16 DIAGNOSIS — I10 Essential (primary) hypertension: Secondary | ICD-10-CM

## 2023-06-16 MED ORDER — METOPROLOL TARTRATE 25 MG PO TABS
25.0000 mg | ORAL_TABLET | Freq: Two times a day (BID) | ORAL | 0 refills | Status: DC
  Filled 2023-06-16: qty 60, 30d supply, fill #0

## 2023-06-17 ENCOUNTER — Ambulatory Visit: Payer: Self-pay | Admitting: "Endocrinology

## 2023-06-17 ENCOUNTER — Other Ambulatory Visit: Payer: Self-pay | Admitting: "Endocrinology

## 2023-06-17 ENCOUNTER — Other Ambulatory Visit: Payer: Self-pay

## 2023-06-17 MED ORDER — WEGOVY 0.25 MG/0.5ML ~~LOC~~ SOAJ
0.2500 mg | SUBCUTANEOUS | 2 refills | Status: DC
  Filled 2023-06-17: qty 2, 28d supply, fill #0

## 2023-06-18 ENCOUNTER — Other Ambulatory Visit: Payer: Self-pay

## 2023-06-18 ENCOUNTER — Other Ambulatory Visit: Payer: Self-pay | Admitting: Internal Medicine

## 2023-06-18 ENCOUNTER — Ambulatory Visit: Admitting: Obstetrics and Gynecology

## 2023-06-18 VITALS — BP 135/87 | HR 67 | Wt 295.0 lb

## 2023-06-18 DIAGNOSIS — E282 Polycystic ovarian syndrome: Secondary | ICD-10-CM

## 2023-06-18 DIAGNOSIS — N939 Abnormal uterine and vaginal bleeding, unspecified: Secondary | ICD-10-CM

## 2023-06-18 DIAGNOSIS — J4520 Mild intermittent asthma, uncomplicated: Secondary | ICD-10-CM

## 2023-06-18 MED ORDER — IPRATROPIUM-ALBUTEROL 0.5-2.5 (3) MG/3ML IN SOLN: 3.0000 mL | Freq: Four times a day (QID) | RESPIRATORY_TRACT | 1 refills | Status: AC | PRN

## 2023-06-18 NOTE — H&P (View-Only) (Signed)
 GYNECOLOGY VISIT  Patient name: Kristen [name] MRN [id number]  Date of birth: Dec 22, 1985 Chief Complaint:   Endometrial Biospy  History:  Kristen [name] is a 38 y.o. G0P0 being seen today for preoperative visit. Currently on day 3 of her menses. Excited to have surgery completed. She is certain she wants to proceed with hysterectomy.  States she was not aware of EMB today and declines at this time.   Past Medical History:  Diagnosis Date   Anxiety 2013   treated at [facility] by Terresa Ferry    Asthma 1989   no hospitalization, or intubation, previously on advair and singulair . asthma worsening.  Follows w/ Dr. Concetta Dee @ [place]. Per pt on 01/17/23, she only uses inhaler if she is doing strenous exercise or during the change of seasons.   Chronic diarrhea    Follows w/ [facility].   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   Dysfunctional uterine bleeding    Dysrhythmia 2010   PVC's, palpitations   Ehlers-Danlos syndrome    Follows with Dr. Luevenia Saha, Sports Medicine and PCP.   Essential hypertension 03/12/2019   Follows w/ [facility] cardiology.   GERD (gastroesophageal reflux disease)    History of hiatal hernia    History of suicidal ideation    IBS (irritable bowel syndrome)    Insulin  resistance    Follows w/ endocrinology.   Kyphosis of thoracic region 2004   painful, runs in dad side    Lymphocytic hypophysitis (HCC)    Follows w/ endocrinology.   Migraine    Follows w/ PCP.   Orthostatic hypotension    Follows w/ [facility], Slater Duncan, NP.   Pancreatitis due to biliary obstruction 2003   PCOS (polycystic ovarian syndrome) 2014   facial hair, irregular periods, never been pregnant    PTSD (post-traumatic stress disorder)    Follows w/ Behavioral Health.   Sleep apnea    not currently wearing Cpap   Social phobia     Follows w/ Behavioral Health.    Past Surgical History:  Procedure Laterality Date   BIOPSY  06/11/2021   Procedure: BIOPSY;  Surgeon: Sergio Dandy, MD;  Location: [facility];  Service: Gastroenterology;;   BIOPSY  02/25/2023   Procedure: BIOPSY;  Surgeon: Sergio Dandy, MD;  Location: [facility];  Service: Gastroenterology;;   BLADDER SURGERY     bladder stretched as a child   CHOLECYSTECTOMY  2003   COLONOSCOPY WITH PROPOFOL  N/A 06/11/2021   Procedure: COLONOSCOPY WITH PROPOFOL ;  Surgeon: Sergio Dandy, MD;  [facility]: [facility];  Service: Gastroenterology;  Laterality: N/A;   DILATATION & CURETTAGE/HYSTEROSCOPY WITH MYOSURE N/A 12/14/2019   Procedure: DILATATION & CURETTAGE/HYSTEROSCOPY WITH Polypectomy with MYOSURE;  Surgeon: Julianne Octave, MD;  [facility]: [facility];  Service: Gynecology;  Laterality: N/A;   ESOPHAGOGASTRODUODENOSCOPY (EGD) WITH PROPOFOL  N/A 02/25/2023   Procedure: ESOPHAGOGASTRODUODENOSCOPY (EGD) WITH PROPOFOL ;  Surgeon: Sergio Dandy, MD;  [facility];  Service: Gastroenterology;  Laterality: N/A;   INTRAUTERINE DEVICE (IUD) INSERTION N/A 01/21/2023   Procedure: INTRAUTERINE DEVICE (IUD) INSERTION(MIRENA );  Surgeon: Tresia Fruit, MD;  Location: [facility];  Service: Gynecology;  Laterality: N/A;   MOHS SURGERY Left 11/2022   Right side and left leg   OPEN REDUCTION INTERNAL FIXATION (ORIF) PROXIMAL PHALANX Right 07/23/2013  Procedure: OPEN TREATMENT RIGHT RING FINGER, PROXIMAL INTERPHALANGEAL/JOINT FRACTURE DISLOCATION;  Surgeon: Sheryl Donna, MD;  Location: [facility];  Service: Orthopedics;  Laterality: Right;   TONSILLECTOMY     and adenoidectomy    The following portions of the patient's history were reviewed and updated as appropriate: allergies, current medications, past family history, past medical history, past social history, past surgical history and problem list.   Health  Maintenance:   Last pap     Component Value Date/Time   DIAGPAP  03/10/2019 1525    - Negative for intraepithelial lesion or malignancy (NILM)   HPVHIGH Negative 03/10/2019 1525   ADEQPAP  03/10/2019 1525    Satisfactory for evaluation; transformation zone component PRESENT.    Last mammogram: n/a   Review of Systems:  Pertinent items are noted in HPI. Comprehensive review of systems was otherwise negative.   Objective:  Physical Exam BP 135/87   Pulse 67   Wt 295 lb (133.8 kg)   LMP 06/15/2023 (Exact Date)   BMI 47.61 kg/m    Physical Exam Vitals and nursing note reviewed.  Constitutional:      Appearance: Normal appearance.  HENT:     Head: Normocephalic and atraumatic.  Pulmonary:     Effort: Pulmonary effort is normal.  Skin:    General: Skin is warm and dry.  Neurological:     General: No focal deficit present.     Mental Status: She is alert.  Psychiatric:        Mood and Affect: Mood normal.        Behavior: Behavior normal.        Thought Content: Thought content normal.        Judgment: Judgment normal.        Assessment & Plan:   1. Abnormal uterine bleeding (AUB) (Primary) 2. PCOS (polycystic ovarian syndrome) Discussed recommendation for pre-procedure EMB to rule out pre-malignant and malignant endometrial pathology. Reviewed that if hysterectomy completed, there is risk of it being diagnosed after surgery and may need additional procedures or treatments. Patient accepts risk and will proceed with hysterectomy without EMB.   No allergies to tylenol , ibuprofen  or oxycodone .   Patient desires surgical management with TLH, BS, cysto.  The risks of surgery were discussed in detail with the patient including but not limited to: bleeding which may require transfusion or reoperation; infection which may require prolonged hospitalization or re-hospitalization and antibiotic therapy; injury to bowel, bladder, ureters and major vessels or other surrounding  organs which may lead to other procedures; formation of adhesions; need for additional procedures including laparotomy or subsequent procedures secondary to intraoperative injury or abnormal pathology; thromboembolic phenomenon; incisional problems and other postoperative or anesthesia complications.  Patient was told that the likelihood that her condition and symptoms will be treated effectively with this surgical management was high regarding bleeding and moderate to high for pelvic pain; the postoperative expectations were also discussed in detail. The patient also understands the alternative treatment options which were discussed in full. All questions were answered.  She was told that she will be contacted by our surgical scheduler regarding the time and date of her surgery; routine preoperative instructions will be given to her by the preoperative nursing team.    Printed patient education handouts about the procedure were given to the patient to review at home.   Routine preventative health maintenance measures emphasized.  Kiki Pelton, MD [facility], [facility]

## 2023-06-18 NOTE — Progress Notes (Signed)
 GYNECOLOGY VISIT  Patient name: Kristen [name] MRN [id number]  Date of birth: Dec 22, 1985 Chief Complaint:   Endometrial Biospy  History:  Kristen [name] is a 38 y.o. G0P0 being seen today for preoperative visit. Currently on day 3 of her menses. Excited to have surgery completed. She is certain she wants to proceed with hysterectomy.  States she was not aware of EMB today and declines at this time.   Past Medical History:  Diagnosis Date   Anxiety 2013   treated at [facility] by Terresa Ferry    Asthma 1989   no hospitalization, or intubation, previously on advair and singulair . asthma worsening.  Follows w/ Dr. Concetta Dee @ [place]. Per pt on 01/17/23, she only uses inhaler if she is doing strenous exercise or during the change of seasons.   Chronic diarrhea    Follows w/ [facility].   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   Dysfunctional uterine bleeding    Dysrhythmia 2010   PVC's, palpitations   Ehlers-Danlos syndrome    Follows with Dr. Luevenia Saha, Sports Medicine and PCP.   Essential hypertension 03/12/2019   Follows w/ [facility] cardiology.   GERD (gastroesophageal reflux disease)    History of hiatal hernia    History of suicidal ideation    IBS (irritable bowel syndrome)    Insulin  resistance    Follows w/ endocrinology.   Kyphosis of thoracic region 2004   painful, runs in dad side    Lymphocytic hypophysitis (HCC)    Follows w/ endocrinology.   Migraine    Follows w/ PCP.   Orthostatic hypotension    Follows w/ [facility], Slater Duncan, NP.   Pancreatitis due to biliary obstruction 2003   PCOS (polycystic ovarian syndrome) 2014   facial hair, irregular periods, never been pregnant    PTSD (post-traumatic stress disorder)    Follows w/ Behavioral Health.   Sleep apnea    not currently wearing Cpap   Social phobia     Follows w/ Behavioral Health.    Past Surgical History:  Procedure Laterality Date   BIOPSY  06/11/2021   Procedure: BIOPSY;  Surgeon: Sergio Dandy, MD;  [facility];  Service: Gastroenterology;;   BIOPSY  02/25/2023   Procedure: BIOPSY;  Surgeon: Sergio Dandy, MD;  [facility];  Service: Gastroenterology;;   BLADDER SURGERY     bladder stretched as a child   CHOLECYSTECTOMY  2003   COLONOSCOPY WITH PROPOFOL  N/A 06/11/2021   Procedure: COLONOSCOPY WITH PROPOFOL ;  Surgeon: Sergio Dandy, MD;  [facility];  Service: Gastroenterology;  Laterality: N/A;   DILATATION & CURETTAGE/HYSTEROSCOPY WITH MYOSURE N/A 12/14/2019   Procedure: DILATATION & CURETTAGE/HYSTEROSCOPY WITH Polypectomy with MYOSURE;  Surgeon: Julianne Octave, MD;  [facility];  Service: Gynecology;  Laterality: N/A;   ESOPHAGOGASTRODUODENOSCOPY (EGD) WITH PROPOFOL  N/A 02/25/2023   Procedure: ESOPHAGOGASTRODUODENOSCOPY (EGD) WITH PROPOFOL ;  Surgeon: Sergio Dandy, MD;  [facility];  Service: Gastroenterology;  Laterality: N/A;   INTRAUTERINE DEVICE (IUD) INSERTION N/A 01/21/2023   Procedure: INTRAUTERINE DEVICE (IUD) INSERTION(MIRENA );  Surgeon: Tresia Fruit, MD;  Location: [facility];  Service: Gynecology;  Laterality: N/A;   MOHS SURGERY Left 11/2022   Right side and left leg   OPEN REDUCTION INTERNAL FIXATION (ORIF) PROXIMAL PHALANX Right 07/23/2013  Procedure: OPEN TREATMENT RIGHT RING FINGER, PROXIMAL INTERPHALANGEAL/JOINT FRACTURE DISLOCATION;  Surgeon: Sheryl Donna, MD;  Location: [facility];  Service: Orthopedics;  Laterality: Right;   TONSILLECTOMY     and adenoidectomy    The following portions of the patient's history were reviewed and updated as appropriate: allergies, current medications, past family history, past medical history, past social history, past surgical history and problem list.   Health  Maintenance:   Last pap     Component Value Date/Time   DIAGPAP  03/10/2019 1525    - Negative for intraepithelial lesion or malignancy (NILM)   HPVHIGH Negative 03/10/2019 1525   ADEQPAP  03/10/2019 1525    Satisfactory for evaluation; transformation zone component PRESENT.    Last mammogram: n/a   Review of Systems:  Pertinent items are noted in HPI. Comprehensive review of systems was otherwise negative.   Objective:  Physical Exam BP 135/87   Pulse 67   Wt 295 lb (133.8 kg)   LMP 06/15/2023 (Exact Date)   BMI 47.61 kg/m    Physical Exam Vitals and nursing note reviewed.  Constitutional:      Appearance: Normal appearance.  HENT:     Head: Normocephalic and atraumatic.  Pulmonary:     Effort: Pulmonary effort is normal.  Skin:    General: Skin is warm and dry.  Neurological:     General: No focal deficit present.     Mental Status: She is alert.  Psychiatric:        Mood and Affect: Mood normal.        Behavior: Behavior normal.        Thought Content: Thought content normal.        Judgment: Judgment normal.        Assessment & Plan:   1. Abnormal uterine bleeding (AUB) (Primary) 2. PCOS (polycystic ovarian syndrome) Discussed recommendation for pre-procedure EMB to rule out pre-malignant and malignant endometrial pathology. Reviewed that if hysterectomy completed, there is risk of it being diagnosed after surgery and may need additional procedures or treatments. Patient accepts risk and will proceed with hysterectomy without EMB.   No allergies to tylenol , ibuprofen  or oxycodone .   Patient desires surgical management with TLH, BS, cysto.  The risks of surgery were discussed in detail with the patient including but not limited to: bleeding which may require transfusion or reoperation; infection which may require prolonged hospitalization or re-hospitalization and antibiotic therapy; injury to bowel, bladder, ureters and major vessels or other surrounding  organs which may lead to other procedures; formation of adhesions; need for additional procedures including laparotomy or subsequent procedures secondary to intraoperative injury or abnormal pathology; thromboembolic phenomenon; incisional problems and other postoperative or anesthesia complications.  Patient was told that the likelihood that her condition and symptoms will be treated effectively with this surgical management was high regarding bleeding and moderate to high for pelvic pain; the postoperative expectations were also discussed in detail. The patient also understands the alternative treatment options which were discussed in full. All questions were answered.  She was told that she will be contacted by our surgical scheduler regarding the time and date of her surgery; routine preoperative instructions will be given to her by the preoperative nursing team.    Printed patient education handouts about the procedure were given to the patient to review at home.   Routine preventative health maintenance measures emphasized.  Kiki Pelton, MD [facility] for [facility], [facility]

## 2023-06-19 ENCOUNTER — Ambulatory Visit (HOSPITAL_COMMUNITY): Admitting: Clinical

## 2023-06-19 ENCOUNTER — Other Ambulatory Visit: Payer: Self-pay

## 2023-06-19 DIAGNOSIS — F331 Major depressive disorder, recurrent, moderate: Secondary | ICD-10-CM

## 2023-06-19 NOTE — Progress Notes (Signed)
 THERAPIST PROGRESS NOTE Virtual Visit via Video Note  I connected with [employer] on 06/19/2023 at  2:00 PM EDT by a video enabled telemedicine application and verified that I am speaking with the correct person using two identifiers.  Location: Patient: home Provider: office   I discussed the limitations of evaluation and management by telemedicine and the availability of in person appointments. The patient expressed understanding and agreed to proceed.   Follow Up Instructions: I discussed the assessment and treatment plan with the patient. The patient was provided an opportunity to ask questions and all were answered. The patient agreed with the plan and demonstrated an understanding of the instructions.   The patient was advised to call back or seek an in-person evaluation if the symptoms worsen or if the condition fails to improve as anticipated.   Session Time: 30 minutes  Participation Level: Active  Behavioral Response: CasualAlertEuthymic  Type of Therapy: Individual Therapy  Treatment Goals addressed: client will attend at least 80% of scheduled individual psychotherapy sessions  ProgressTowards Goals: Progressing  Interventions: CBT and Supportive  Summary:  Sidrah Harden is a 38 y.o. female who presents for the scheduled appointment oriented x 4, appropriately dressed, and friendly.  Client denied hallucinations and delusions. Client reported today she is doing fairly okay.  Client reported she attended the concert with her fianc as previously mentioned and she is glad that she went.  Client reported she has been at home helping out both of her parents.  Client reported her father is an active due to having ulcers on his feet related to diabetes.  Client reported her dad has a very nasty attitude that wears on her mental as well.  Client reported her mother who was in therapy also contributes to her depressive symptoms because she dumps all of  her thoughts and feelings into her as well.  Client reported she unintentionally has thoughts and feelings of jealousy when her friends tell her about all of the trips and cool things that they are able to do.  Client reported she does not have the financial means to do so but hopefully 1 day she will be able to travel.  Client reported she has a fianc but she cannot live with him because he cannot financially support both of them.  Client reported she will have her surgery for hysterectomy soon and she is happy about that.  Client reported that would alleviate a lot of the health symptoms that she deals with chronically. Evidence of progress towards goal: Client reported 1 issue which is appropriately asserting boundaries and interpersonal relationships to prevent emotional burnout.  Suicidal/Homicidal: Nowithout intent/plan  Therapist Response:  Therapist began the appointment asking the client how she has been doing since last seen. Therapist engaged with active listening and positive emotional support. Therapist used CBT to normalize the clients emotions and thoughts within reason as to the stressor and engage with her in discussion about how to create appropriate boundaries to help alleviate stress off her. Therapist used CBT ask the client to identify her progress with frequency of use with coping skills with continued practice in her daily activity.    Therapist assigned client homework to call her parents home health aide agency as well as discuss her mom attending therapy again.   Plan: Return again in 4 weeks.  Diagnosis: moderate episode of recurrent major depression  Collaboration of Care: Patient refused AEB none requested by the client.  Patient/Guardian was advised Release of  Information must be obtained prior to any record release in order to collaborate their care with an outside provider. Patient/Guardian was advised if they have not already done so to contact the registration  department to sign all necessary forms in order for us  to release information regarding their care.   Consent: Patient/Guardian gives verbal consent for treatment and assignment of benefits for services provided during this visit. Patient/Guardian expressed understanding and agreed to proceed.   Siyona Coto Y Wally Behan, LCSW 06/19/2023

## 2023-06-20 ENCOUNTER — Ambulatory Visit: Attending: Internal Medicine | Admitting: Internal Medicine

## 2023-06-20 ENCOUNTER — Encounter: Payer: Self-pay | Admitting: Internal Medicine

## 2023-06-21 LAB — LUTEINIZING HORMONE: LH: 0.8 m[IU]/mL

## 2023-06-21 LAB — BASIC METABOLIC PANEL WITH GFR
BUN: 14 mg/dL (ref 7–25)
CO2: 28 mmol/L (ref 20–32)
Calcium: 9.5 mg/dL (ref 8.6–10.2)
Chloride: 106 mmol/L (ref 98–110)
Creat: 0.95 mg/dL (ref 0.50–0.97)
Glucose, Bld: 90 mg/dL (ref 65–99)
Potassium: 4.5 mmol/L (ref 3.5–5.3)
Sodium: 140 mmol/L (ref 135–146)
eGFR: 80 mL/min/{1.73_m2} (ref >=60)

## 2023-06-21 LAB — CORTISOL: Cortisol, Plasma: 12.7 ug/dL

## 2023-06-21 LAB — FOLLICLE STIMULATING HORMONE: FSH: 5.2 m[IU]/mL

## 2023-06-21 LAB — LIPID PANEL
Cholesterol: 206 mg/dL — ABNORMAL HIGH (ref <200)
HDL: 35 mg/dL — ABNORMAL LOW (ref >=50)
LDL Cholesterol (Calc): 140 mg/dL — ABNORMAL HIGH
Non-HDL Cholesterol (Calc): 171 mg/dL — ABNORMAL HIGH (ref <130)
Total CHOL/HDL Ratio: 5.9 (calc) — ABNORMAL HIGH (ref <5.0)
Triglycerides: 169 mg/dL — ABNORMAL HIGH (ref <150)

## 2023-06-21 LAB — GROWTH HORMONE: Growth Hormone: <0.1 ng/mL (ref <=7.1)

## 2023-06-21 LAB — PROLACTIN: Prolactin: 12.2 ng/mL

## 2023-06-21 LAB — ACTH: C206 ACTH: 27 pg/mL (ref 6–50)

## 2023-06-21 LAB — ESTRADIOL: Estradiol: 46 pg/mL

## 2023-06-21 LAB — T4, FREE: Free T4: 1.3 ng/dL (ref 0.8–1.8)

## 2023-06-21 LAB — INSULIN-LIKE GROWTH FACTOR
IGF-I, LC/MS: 106 ng/mL (ref 53–331)
Z-Score (Female): -0.6 {STDV} (ref ?–2.0)

## 2023-06-21 LAB — TSH: TSH: 2.37 m[IU]/L

## 2023-06-23 ENCOUNTER — Other Ambulatory Visit: Payer: Self-pay

## 2023-06-23 NOTE — Pre-Procedure Instructions (Signed)
 Surgical Instructions   Your procedure is scheduled on Tuesday, May 27th. Report to [facility] Main Entrance "A" at 10:40 A.M., then check in with the Admitting office. Any questions or running late day of surgery: call [phone]  Questions prior to your surgery date: call [phone], Monday-Friday, 8am-4pm. If you experience any cold or flu symptoms such as cough, fever, chills, shortness of breath, etc. between now and your scheduled surgery, please notify us  at the above number.     Remember:  Do not eat after midnight the night before your surgery   You may drink clear liquids until 09:40 AM the morning of your surgery.   Clear liquids allowed are: Water , Non-Citrus Juices (without pulp), Carbonated Beverages, Clear Tea (no milk, honey, etc.), Black Coffee Only (NO MILK, CREAM OR POWDERED CREAMER of any kind), and Gatorade.    Take these medicines the morning of surgery with A SIP OF WATER   busPIRone  (BUSPAR )  colestipol  (COLESTID )  hydrOXYzine  (ATARAX )  metoprolol  tartrate (LOPRESSOR )  omeprazole  (PRILOSEC)     May take these medicines IF NEEDED: albuterol  (VENTOLIN  HFA)- bring inhaler with you on day of surgery ipratropium-albuterol  (DUONEB)  propranolol  (INDERAL )  SUMAtriptan  (IMITREX )    STOP taking Semaglutide -Weight Management (WEGOVY ) 7 days prior to surgery. Last dose on or before 5/19.  One week prior to surgery, STOP taking any Aspirin (unless otherwise instructed by your surgeon) Aleve , Naproxen , Ibuprofen , Motrin , Advil , Goody's, BC's, all herbal medications, fish oil, and non-prescription vitamins. This includes diclofenac  Sodium (VOLTAREN ) gel.                     Do NOT Smoke (Tobacco/Vaping) for 24 hours prior to your procedure.  If you use a CPAP at night, you may bring your mask/headgear for your overnight stay.   You will be asked to remove any contacts, glasses, piercing's, hearing aid's, dentures/partials prior to surgery. Please bring cases  for these items if needed.    Patients discharged the day of surgery will not be allowed to drive home, and someone needs to stay with them for 24 hours.  SURGICAL WAITING ROOM VISITATION Patients may have no more than 2 support people in the waiting area - these visitors may rotate.   Pre-op nurse will coordinate an appropriate time for 1 ADULT support person, who may not rotate, to accompany patient in pre-op.  Children under the age of 24 must have an adult with them who is not the patient and must remain in the main waiting area with an adult.  If the patient needs to stay at the hospital during part of their recovery, the visitor guidelines for inpatient rooms apply.  Please refer to the [facility] website for the visitor guidelines for any additional information.   If you received a COVID test during your pre-op visit  it is requested that you wear a mask when out in public, stay away from anyone that may not be feeling well and notify your surgeon if you develop symptoms. If you have been in contact with anyone that has tested positive in the last 10 days please notify you surgeon.      Pre-operative CHG Bathing Instructions   You can play a key role in reducing the risk of infection after surgery. Your skin needs to be as free of germs as possible. You can reduce the number of germs on your skin by washing with CHG (chlorhexidine  gluconate) soap before surgery. CHG is an antiseptic soap that kills  germs and continues to kill germs even after washing.   DO NOT use if you have an allergy  to chlorhexidine /CHG or antibacterial soaps. If your skin becomes reddened or irritated, stop using the CHG and notify one of our RNs at [phone].              TAKE A SHOWER THE NIGHT BEFORE SURGERY AND THE DAY OF SURGERY    Please keep in mind the following:  DO NOT shave, including legs and underarms, 48 hours prior to surgery.   You may shave your face before/day of surgery.  Place clean  sheets on your bed the night before surgery Use a clean washcloth (not used since being washed) for each shower. DO NOT sleep with pet's night before surgery.  CHG Shower Instructions:  Wash your face and private area with normal soap. If you choose to wash your hair, wash first with your normal shampoo.  After you use shampoo/soap, rinse your hair and body thoroughly to remove shampoo/soap residue.  Turn the water  OFF and apply half the bottle of CHG soap to a CLEAN washcloth.  Apply CHG soap ONLY FROM YOUR NECK DOWN TO YOUR TOES (washing for 3-5 minutes)  DO NOT use CHG soap on face, private areas, open wounds, or sores.  Pay special attention to the area where your surgery is being performed.  If you are having back surgery, having someone wash your back for you may be helpful. Wait 2 minutes after CHG soap is applied, then you may rinse off the CHG soap.  Pat dry with a clean towel  Put on clean pajamas    Additional instructions for the day of surgery: DO NOT APPLY any lotions, deodorants, cologne, or perfumes.   Do not wear jewelry or makeup Do not wear nail polish, gel polish, artificial nails, or any other type of covering on natural nails (fingers and toes) Do not bring valuables to the hospital. [facility] is not responsible for valuables/personal belongings. Put on clean/comfortable clothes.  Please brush your teeth.  Ask your nurse before applying any prescription medications to the skin.

## 2023-06-24 ENCOUNTER — Encounter (HOSPITAL_COMMUNITY)
Admission: RE | Admit: 2023-06-24 | Discharge: 2023-06-24 | Disposition: A | Discharge status: Home / Self Care | Source: Ambulatory Visit | Attending: Obstetrics and Gynecology | Admitting: Obstetrics and Gynecology

## 2023-06-24 ENCOUNTER — Other Ambulatory Visit: Payer: Self-pay

## 2023-06-24 ENCOUNTER — Encounter (HOSPITAL_COMMUNITY): Payer: Self-pay

## 2023-06-24 VITALS — BP 118/91 | HR 75 | Temp 98.3°F | Resp 18 | Ht 66.0 in | Wt 295.0 lb

## 2023-06-24 DIAGNOSIS — G90A Postural orthostatic tachycardia syndrome (POTS): Secondary | ICD-10-CM | POA: Diagnosis not present

## 2023-06-24 DIAGNOSIS — Z01812 Encounter for preprocedural laboratory examination: Secondary | ICD-10-CM | POA: Insufficient documentation

## 2023-06-24 DIAGNOSIS — I10 Essential (primary) hypertension: Secondary | ICD-10-CM | POA: Insufficient documentation

## 2023-06-24 DIAGNOSIS — Z01818 Encounter for other preprocedural examination: Secondary | ICD-10-CM | POA: Diagnosis present

## 2023-06-24 DIAGNOSIS — N938 Other specified abnormal uterine and vaginal bleeding: Secondary | ICD-10-CM | POA: Insufficient documentation

## 2023-06-24 HISTORY — DX: Family history of other specified conditions: Z84.89

## 2023-06-24 LAB — BASIC METABOLIC PANEL WITH GFR
Anion gap: 7 (ref 5–15)
BUN: 12 mg/dL (ref 6–20)
CO2: 25 mmol/L (ref 22–32)
Calcium: 9.2 mg/dL (ref 8.9–10.3)
Chloride: 107 mmol/L (ref 98–111)
Creatinine, Ser: 1.02 mg/dL — ABNORMAL HIGH (ref 0.44–1.00)
GFR, Estimated: >60 mL/min (ref >60)
Glucose, Bld: 110 mg/dL — ABNORMAL HIGH (ref 70–99)
Potassium: 3.9 mmol/L (ref 3.5–5.1)
Sodium: 139 mmol/L (ref 135–145)

## 2023-06-24 LAB — CBC
HCT: 39.9 % (ref 36.0–46.0)
Hemoglobin: 12.3 g/dL (ref 12.0–15.0)
MCH: 25 pg — ABNORMAL LOW (ref 26.0–34.0)
MCHC: 30.8 g/dL (ref 30.0–36.0)
MCV: 81.1 fL (ref 80.0–100.0)
Platelets: 247 10*3/uL (ref 150–400)
RBC: 4.92 MIL/uL (ref 3.87–5.11)
RDW: 15 % (ref 11.5–15.5)
WBC: 8.5 10*3/uL (ref 4.0–10.5)
nRBC: 0 % (ref 0.0–0.2)

## 2023-06-24 LAB — TYPE AND SCREEN
ABO/RH(D): O POS
Antibody Screen: NEGATIVE

## 2023-06-24 NOTE — Progress Notes (Signed)
 PCP - Dr. Concetta Dee Cardiologist - Dr. Riccardo Chamberlain-- sees yearly for POTS Endocrinology- Dr. Jorge Newcomer  PPM/ICD - n/a Device Orders -  Rep Notified -   Chest x-ray - 06/15/21 EKG - 07/08/22 Stress Test - denies ECHO - 07/13/20 Cardiac Cath - denies   Sleep Study - 03/13/23 negative test, but pt reports 2018 sleep study showed borderline OSA. CPAP - does not use   Fasting Blood Sugar - n/a Checks Blood Sugar _____ times a day  Last dose of GLP1 agonist-  Wegovy  on pt's med list: pt reports she is still waiting on insurance approval and has not started. GLP1 instructions:   Blood Thinner Instructions: Aspirin Instructions:  ERAS Protcol - clear liquids until 9:40am PRE-SURGERY Ensure or G2-   COVID TEST- n/a  Patient denies shortness of breath, fever, cough and chest pain at PAT appointment

## 2023-06-25 ENCOUNTER — Other Ambulatory Visit: Payer: Self-pay

## 2023-06-25 ENCOUNTER — Encounter: Payer: Self-pay | Admitting: Internal Medicine

## 2023-06-25 ENCOUNTER — Ambulatory Visit: Admitting: Internal Medicine

## 2023-06-25 VITALS — BP 108/68 | HR 69 | Temp 98.1°F | Ht 66.5 in | Wt 296.6 lb

## 2023-06-25 DIAGNOSIS — Z7722 Contact with and (suspected) exposure to environmental tobacco smoke (acute) (chronic): Secondary | ICD-10-CM

## 2023-06-25 DIAGNOSIS — J4520 Mild intermittent asthma, uncomplicated: Secondary | ICD-10-CM | POA: Diagnosis not present

## 2023-06-25 DIAGNOSIS — G4733 Obstructive sleep apnea (adult) (pediatric): Secondary | ICD-10-CM

## 2023-06-25 NOTE — Patient Instructions (Signed)
 It was a pleasure to see you today!  Please schedule follow up with myself in 3 months.  If my schedule is not open yet, we will contact you with a reminder closer to that time. Please call [phone] if you haven't heard from us  a month before, and always call us  sooner if issues or concerns arise. You can also send us  a message through MyChart, but but aware that this is not to be used for urgent issues and it may take up to 5-7 days to receive a reply. Please be aware that you will likely be able to view your results before I have a chance to respond to them. Please give us  5 business days to respond to any non-urgent results.    Before your next visit I would like you to have: Split night study - sleep study  VISIT SUMMARY:  Today, you came in for a routine follow-up visit to discuss your asthma and sleep apnea. We reviewed your current symptoms, medications, and recent medical history.  YOUR PLAN:  -ASTHMA: Asthma is a condition where your airways narrow and swell, making it difficult to breathe. Your asthma is currently well-controlled with your albuterol  inhaler and DuoNeb nebulizer solution, which you use as needed. Continue using these medications as you have been. If you find yourself needing them more than twice a week, we may need to consider a maintenance inhaler.  -SLEEP APNEA: Sleep apnea is a condition where your breathing repeatedly stops and starts during sleep. Your symptoms suggest that your sleep apnea may be worsening. We will order an in-lab sleep study with split night study to get a more accurate assessment. If the study shows that your apnea-hypopnea index (AHI) is 5 or above, we will be able to proceed with CPAP therapy.  INSTRUCTIONS:  Please schedule an in-lab sleep study with CPAP titration as soon as possible. Continue using your current asthma medications as needed, and monitor your usage. If you need your inhaler more than twice a week, please contact us .

## 2023-06-25 NOTE — Progress Notes (Signed)
 Kristen [name]    [id number]    19-Jul-1985  Primary Care Physician:Johnson, Rexine Cater, MD  Referring Physician: Lawrance Presume, MD [address] [place],  [place] [address] Reason for Consultation: asthma Date of Consultation: 06/25/2023  Chief complaint:   Chief Complaint  Patient presents with   Consult    Re-establish care. Sob w/exertion and pollen, no cough, wheezing     HPI: Discussed the use of AI scribe software for clinical note transcription with the patient, who gave verbal consent to proceed.  History of Present Illness Kristen [name] is a 38 year old female with asthma and sleep apnea who presents for routine follow-up.  New patient to us  after several years, previously saw Dr. Waylan Haggard.   She has a history of asthma diagnosed at the age of 68, primarily triggered by environmental factors such as pollen, extreme temperatures, dust, and physical exertion. She experienced a significant asthma attack in 2022, requiring emergency medical attention, including nebulizer treatment and injections. Currently, she uses an albuterol  inhaler and DuoNeb nebulizer solution as needed, typically twice a week, and has not required prednisone  in the past year. No hospitalizations for asthma in the past year, although exacerbations occurred during COVID-19 infections.  She has a history of sleep apnea and recently underwent a home sleep study, which she believes was inaccurate due to restless leg syndrome affecting her sleep. She experiences loud snoring, as reported by her mother and partner, and persistent daytime sleepiness. She previously used a CPAP machine but discontinued due to insurance issues and equipment failure. Has gained weight over the past 7 years,has undergone esophageal dilation and multiple oral surgeries.  Her past medical history includes a pituitary disorder and Ehlers-Danlos syndrome. She denies smoking but was exposed to  secondhand smoke from her mother, who quit when she was 22. She is currently on disability due to mental health issues and Ehlers-Danlos syndrome.  Social history:  Occupation: disabled for mental health Exposures: lives at home with mom Smoking history: never smoker, former passive smoke exposure  Social History   Occupational History   Occupation: Unemployed   Tobacco Use   Smoking status: Never    Passive exposure: Past   Smokeless tobacco: Never  Vaping Use   Vaping status: Never Used  Substance and Sexual Activity   Alcohol use: No   Drug use: No   Sexual activity: Yes    Birth control/protection: I.U.D.    Relevant family history:  Family History  Problem Relation Age of Onset   Colon cancer Mother    Hypertension Mother    Arnold-Chiari malformation Mother    Restless legs syndrome Mother    Osteoporosis Mother    COPD Mother    Asthma Mother    Squamous cell carcinoma Mother        skin    Eczema Mother    Arthritis Mother    Peripheral Artery Disease Mother    Anxiety disorder Mother    OCD Mother    Colonic polyp Mother    Stroke Mother    Hypertension Father    Scoliosis Father    Bipolar disorder Father    Congestive Heart Failure Father    COPD Father    Depression Father    Alcohol abuse Maternal Aunt    Depression Maternal Aunt    Pancreatic cancer Maternal Aunt    Alcohol abuse Paternal Aunt    Depression Paternal Aunt  Diabetes Maternal Grandmother    Hypertension Maternal Grandmother    Dementia Maternal Grandmother    OCD Maternal Grandmother    Alzheimer's disease Maternal Grandmother    Bone cancer Maternal Grandfather 75       bone marrow    Multiple myeloma Maternal Grandfather    Alcohol abuse Maternal Grandfather    Drug abuse Maternal Grandfather    Colon polyps Paternal Grandmother    Colon cancer Paternal Grandmother    Cancer Paternal Grandmother        colon   Diabetes Paternal Grandfather    Alcohol abuse Paternal  Grandfather    Drug abuse Paternal Grandfather    Dementia Paternal Grandfather    ADD / ADHD Cousin    Esophageal cancer Neg Hx    Rectal cancer Neg Hx    Stomach cancer Neg Hx     Past Medical History:  Diagnosis Date   Anxiety 2013   treated at [facility] by Terresa Ferry    Asthma 1989   no hospitalization, or intubation, previously on advair and singulair . asthma worsening.  Follows w/ Dr. Concetta Dee @ [facility]. Per pt on 01/17/23, she only uses inhaler if she is doing strenous exercise or during the change of seasons.   Chronic diarrhea    Follows w/ [facility].   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   Dysfunctional uterine bleeding    Dysrhythmia 2010   PVC's, palpitations   Ehlers-Danlos syndrome    Follows with Dr. Luevenia Saha, Sports Medicine and PCP.   Essential hypertension 03/12/2019   Follows w/ [facility] cardiology.   Family history of adverse reaction to anesthesia    Mother woke up during deviated septal surgery   GERD (gastroesophageal reflux disease)    History of hiatal hernia    History of suicidal ideation    IBS (irritable bowel syndrome)    Insulin  resistance    Follows w/ endocrinology.   Kyphosis of thoracic region 2004   painful, runs in dad side    Lymphocytic hypophysitis (HCC)    Follows w/ endocrinology.   Migraine    Follows w/ PCP.   Orthostatic hypotension    Follows w/ [facility], Slater Duncan, NP.   Pancreatitis due to biliary obstruction 2003   PCOS (polycystic ovarian syndrome) 2014   facial hair, irregular periods, never been pregnant    PTSD (post-traumatic stress disorder)    Follows w/ Behavioral Health.   Sleep apnea    not currently wearing Cpap   Social phobia    Follows w/ Behavioral Health.    Past Surgical History:  Procedure Laterality Date   BIOPSY  06/11/2021   Procedure: BIOPSY;  Surgeon:  Sergio Dandy, MD;  Location: [facility];  Service: Gastroenterology;;   BIOPSY  02/25/2023   Procedure: BIOPSY;  Surgeon: Sergio Dandy, MD;  Location: [facility];  Service: Gastroenterology;;   BLADDER SURGERY     bladder stretched as a child   CHOLECYSTECTOMY  2003   COLONOSCOPY WITH PROPOFOL  N/A 06/11/2021   Procedure: COLONOSCOPY WITH PROPOFOL ;  Surgeon: Sergio Dandy, MD;  Location: [facility];  Service: Gastroenterology;  Laterality: N/A;   DILATATION & CURETTAGE/HYSTEROSCOPY WITH MYOSURE N/A 12/14/2019   Procedure: DILATATION & CURETTAGE/HYSTEROSCOPY WITH Polypectomy with MYOSURE;  Surgeon: Julianne Octave, MD;  Location: [facility];  Service: Gynecology;  Laterality: N/A;   ESOPHAGOGASTRODUODENOSCOPY (EGD) WITH PROPOFOL  N/A 02/25/2023   Procedure: ESOPHAGOGASTRODUODENOSCOPY (EGD) WITH PROPOFOL ;  Surgeon: Sergio Dandy, MD;  Location: [facility];  Service: Gastroenterology;  Laterality: N/A;   INTRAUTERINE DEVICE (IUD) INSERTION N/A 01/21/2023   Procedure: INTRAUTERINE DEVICE (IUD) INSERTION(MIRENA );  Surgeon: Tresia Fruit, MD;  Location: [facility];  Service: Gynecology;  Laterality: N/A;   MOHS SURGERY Left 11/2022   Right side and left leg   OPEN REDUCTION INTERNAL FIXATION (ORIF) PROXIMAL PHALANX Right 07/23/2013   Procedure: OPEN TREATMENT RIGHT RING FINGER, PROXIMAL INTERPHALANGEAL/JOINT FRACTURE DISLOCATION;  Surgeon: Sheryl Donna, MD;  Location: [facility];  Service: Orthopedics;  Laterality: Right;   TONSILLECTOMY     and adenoidectomy     Physical Exam: Blood pressure 108/68, pulse 69, temperature 98.1 F (36.7 C), temperature source Oral, height 5' 6.5" (1.689 m), weight 296 lb 9.6 oz (134.5 kg), last menstrual period 06/15/2023, SpO2 99%. Gen:      No acute distress, overweight ENT:  mallampati IV no nasal polyps, mucus membranes moist +cobblestoning  Lungs:    No increased respiratory effort, symmetric  chest wall excursion, clear to auscultation bilaterally, no wheezes or crackles CV:         Regular rate and rhythm; no murmurs, rubs, or gallops.  No pedal edema Abd:      + bowel sounds; soft, non-tender; no distension MSK: no acute synovitis of DIP or PIP joints, no mechanics hands.  Skin:      Warm and dry; no rashes Neuro: normal speech, no focal facial asymmetry Psych: alert and oriented x3, normal mood and affect   Data Reviewed/Medical Decision Making:  Independent interpretation of tests: Imaging:  Review of patient's chest xray May 2023 images revealed no acute cardiopulmonary disease. The patient's images have been independently reviewed by me.    PFTs: I have personally reviewed the patient's PFTs and normal pulmonary function    Latest Ref Rng & Units 02/19/2017    8:38 AM  PFT Results  FVC-Pre L 4.72   FVC-Predicted Pre % 118   FVC-Post L 5.00   FVC-Predicted Post % 125   Pre FEV1/FVC % % 76   Post FEV1/FCV % % 83   FEV1-Pre L 3.61   FEV1-Predicted Pre % 107   FEV1-Post L 4.16   DLCO uncorrected ml/min/mmHg 33.20   DLCO UNC% % 124   DLCO corrected ml/min/mmHg 32.25   DLCO COR %Predicted % 121   DLVA Predicted % 106   TLC L 6.68   TLC % Predicted % 125   RV % Predicted % 130     Labs:  Lab Results  Component Value Date   NA 139 06/24/2023   K 3.9 06/24/2023   CO2 25 06/24/2023   GLUCOSE 110 (H) 06/24/2023   BUN 12 06/24/2023   CREATININE 1.02 (H) 06/24/2023   CALCIUM 9.2 06/24/2023   GFR 69.70 06/19/2022   EGFR 80 06/16/2023   GFRNONAA >60 06/24/2023   Lab Results  Component Value Date   WBC 8.5 06/24/2023   HGB 12.3 06/24/2023   HCT 39.9 06/24/2023   MCV 81.1 06/24/2023   PLT 247 06/24/2023   Absolute eosinophil count 400  Immunization status:  Immunization History  Administered Date(s) Administered   Influenza Whole 02/02/2007   Influenza, Seasonal, Injecte, Preservative Fre 12/30/2022   Influenza,inj,Quad PF,6+ Mos 10/10/2016,  11/05/2019   PFIZER(Purple Top)SARS-COV-2 Vaccination 06/23/2019, 07/16/2019   PNEUMOCOCCAL CONJUGATE-20 05/01/2023  Tdap 12/30/2022     I reviewed prior external note(s) from pulmonary, sleep neurology, pcp, behavioral health  I reviewed the result(s) of the labs and imaging as noted above.   I have ordered split night study  Assessment and Plan Assessment & Plan Mild intermittent Asthma Asthma well-controlled with current as-needed inhaler use. Maintenance therapy may be needed if control worsens. - Continue albuterol  inhaler and DuoNeb nebulizer as needed. - Consider maintenance inhaler if usage exceeds twice a week.  Sleep apnea Mild sleep apnea with symptoms suggesting worsening. Home study may underestimate severity. - Order in-lab sleep study with CPAP titration. - Proceed with CPAP therapy if AHI is 5 or above.    Return to Care: Return in about 3 months (around 09/25/2023).  Louie Rover, MD Pulmonary and Critical Care Medicine [facility] [phone]  CC: Lawrance Presume, MD

## 2023-07-01 ENCOUNTER — Other Ambulatory Visit: Payer: Self-pay

## 2023-07-01 ENCOUNTER — Ambulatory Visit (HOSPITAL_COMMUNITY)
Admission: RE | Admit: 2023-07-01 | Discharge: 2023-07-02 | Disposition: A | Discharge status: Home / Self Care | Attending: Obstetrics and Gynecology | Admitting: Obstetrics and Gynecology

## 2023-07-01 ENCOUNTER — Encounter (HOSPITAL_COMMUNITY): Payer: Self-pay | Admitting: Obstetrics and Gynecology

## 2023-07-01 ENCOUNTER — Ambulatory Visit (HOSPITAL_COMMUNITY): Admitting: Physician Assistant

## 2023-07-01 ENCOUNTER — Encounter (HOSPITAL_COMMUNITY)
Admission: RE | Disposition: A | Discharge status: Home / Self Care | Payer: Self-pay | Source: Home / Self Care | Attending: Obstetrics and Gynecology

## 2023-07-01 ENCOUNTER — Other Ambulatory Visit (HOSPITAL_COMMUNITY): Payer: Self-pay

## 2023-07-01 DIAGNOSIS — N8003 Adenomyosis of the uterus: Secondary | ICD-10-CM

## 2023-07-01 DIAGNOSIS — I10 Essential (primary) hypertension: Secondary | ICD-10-CM

## 2023-07-01 DIAGNOSIS — J45909 Unspecified asthma, uncomplicated: Secondary | ICD-10-CM

## 2023-07-01 DIAGNOSIS — E282 Polycystic ovarian syndrome: Secondary | ICD-10-CM | POA: Diagnosis not present

## 2023-07-01 DIAGNOSIS — N888 Other specified noninflammatory disorders of cervix uteri: Secondary | ICD-10-CM

## 2023-07-01 DIAGNOSIS — N939 Abnormal uterine and vaginal bleeding, unspecified: Secondary | ICD-10-CM | POA: Diagnosis present

## 2023-07-01 DIAGNOSIS — K449 Diaphragmatic hernia without obstruction or gangrene: Secondary | ICD-10-CM | POA: Diagnosis not present

## 2023-07-01 DIAGNOSIS — G4733 Obstructive sleep apnea (adult) (pediatric): Secondary | ICD-10-CM | POA: Diagnosis not present

## 2023-07-01 DIAGNOSIS — Z01818 Encounter for other preprocedural examination: Secondary | ICD-10-CM

## 2023-07-01 DIAGNOSIS — N946 Dysmenorrhea, unspecified: Secondary | ICD-10-CM | POA: Diagnosis not present

## 2023-07-01 DIAGNOSIS — K219 Gastro-esophageal reflux disease without esophagitis: Secondary | ICD-10-CM | POA: Insufficient documentation

## 2023-07-01 DIAGNOSIS — N938 Other specified abnormal uterine and vaginal bleeding: Secondary | ICD-10-CM

## 2023-07-01 DIAGNOSIS — Z6841 Body Mass Index (BMI) 40.0 and over, adult: Secondary | ICD-10-CM | POA: Diagnosis not present

## 2023-07-01 DIAGNOSIS — E66813 Obesity, class 3: Secondary | ICD-10-CM | POA: Insufficient documentation

## 2023-07-01 HISTORY — PX: TOTAL LAPAROSCOPIC HYSTERECTOMY WITH SALPINGECTOMY: SHX6742

## 2023-07-01 HISTORY — PX: CYSTOSCOPY: SHX5120

## 2023-07-01 LAB — ABO/RH: ABO/RH(D): O POS

## 2023-07-01 SURGERY — HYSTERECTOMY, TOTAL, LAPAROSCOPIC, WITH SALPINGECTOMY
Anesthesia: General | Site: Bladder

## 2023-07-01 MED ORDER — PROPOFOL 10 MG/ML IV BOLUS
INTRAVENOUS | Status: DC | PRN
  Administered 2023-07-01: 200 mg via INTRAVENOUS

## 2023-07-01 MED ORDER — HYDROMORPHONE HCL 1 MG/ML IJ SOLN
0.2000 mg | INTRAMUSCULAR | Status: DC | PRN
  Administered 2023-07-01: 0.6 mg via INTRAVENOUS
  Filled 2023-07-01: qty 1

## 2023-07-01 MED ORDER — ONDANSETRON HCL 4 MG/2ML IJ SOLN: 4.0000 mg | Freq: Four times a day (QID) | INTRAMUSCULAR | Status: DC | PRN

## 2023-07-01 MED ORDER — BUPIVACAINE HCL (PF) 0.5 % IJ SOLN
INTRAMUSCULAR | Status: AC
  Filled 2023-07-01: qty 30

## 2023-07-01 MED ORDER — KETOROLAC TROMETHAMINE 30 MG/ML IJ SOLN
INTRAMUSCULAR | Status: DC | PRN
  Administered 2023-07-01: 30 mg via INTRAVENOUS

## 2023-07-01 MED ORDER — DEXMEDETOMIDINE HCL IN NACL 80 MCG/20ML IV SOLN
INTRAVENOUS | Status: DC | PRN
  Administered 2023-07-01 (×2): 4 ug via INTRAVENOUS
  Administered 2023-07-01: 8 ug via INTRAVENOUS
  Administered 2023-07-01: 4 ug via INTRAVENOUS

## 2023-07-01 MED ORDER — PHENYLEPHRINE 80 MCG/ML (10ML) SYRINGE FOR IV PUSH (FOR BLOOD PRESSURE SUPPORT)
PREFILLED_SYRINGE | INTRAVENOUS | Status: DC | PRN
  Administered 2023-07-01: 80 ug via INTRAVENOUS

## 2023-07-01 MED ORDER — BUPIVACAINE LIPOSOME 1.3 % IJ SUSP
INTRAMUSCULAR | Status: AC
  Filled 2023-07-01: qty 20

## 2023-07-01 MED ORDER — LACTATED RINGERS IV SOLN: INTRAVENOUS | Status: DC

## 2023-07-01 MED ORDER — ONDANSETRON HCL 4 MG/2ML IJ SOLN
INTRAMUSCULAR | Status: DC | PRN
  Administered 2023-07-01: 4 mg via INTRAVENOUS

## 2023-07-01 MED ORDER — GABAPENTIN 300 MG PO CAPS
300.0000 mg | ORAL_CAPSULE | Freq: Every day | ORAL | Status: DC
  Administered 2023-07-01: 300 mg via ORAL
  Filled 2023-07-01: qty 1

## 2023-07-01 MED ORDER — ONDANSETRON HCL 4 MG PO TABS: 4.0000 mg | ORAL_TABLET | Freq: Four times a day (QID) | ORAL | Status: DC | PRN

## 2023-07-01 MED ORDER — BUPIVACAINE LIPOSOME 1.3 % IJ SUSP
INTRAMUSCULAR | Status: DC | PRN
  Administered 2023-07-01: 30 mL via INTRAMUSCULAR

## 2023-07-01 MED ORDER — IPRATROPIUM-ALBUTEROL 0.5-2.5 (3) MG/3ML IN SOLN: 3.0000 mL | Freq: Four times a day (QID) | RESPIRATORY_TRACT | Status: DC | PRN

## 2023-07-01 MED ORDER — ACETAMINOPHEN 500 MG PO TABS
1000.0000 mg | ORAL_TABLET | ORAL | Status: AC
Start: 2023-07-01 — End: 2023-07-01
  Administered 2023-07-01: 1000 mg via ORAL
  Filled 2023-07-01: qty 2

## 2023-07-01 MED ORDER — HYDROXYZINE HCL 25 MG PO TABS
25.0000 mg | ORAL_TABLET | Freq: Four times a day (QID) | ORAL | Status: DC
  Administered 2023-07-01 – 2023-07-02 (×2): 25 mg via ORAL
  Filled 2023-07-01 (×2): qty 1

## 2023-07-01 MED ORDER — POLYETHYLENE GLYCOL 3350 17 G PO PACK: 17.0000 g | PACK | Freq: Every day | ORAL | Status: DC | PRN

## 2023-07-01 MED ORDER — ACETAMINOPHEN 500 MG PO TABS
500.0000 mg | ORAL_TABLET | Freq: Four times a day (QID) | ORAL | 0 refills | Status: AC | PRN
  Filled 2023-07-01: qty 120, 30d supply, fill #0

## 2023-07-01 MED ORDER — SIMETHICONE 80 MG PO CHEW: 80.0000 mg | CHEWABLE_TABLET | Freq: Four times a day (QID) | ORAL | Status: DC | PRN

## 2023-07-01 MED ORDER — QUETIAPINE FUMARATE 25 MG PO TABS
100.0000 mg | ORAL_TABLET | Freq: Every day | ORAL | Status: DC
  Administered 2023-07-01: 100 mg via ORAL
  Filled 2023-07-01: qty 4

## 2023-07-01 MED ORDER — DEXAMETHASONE SODIUM PHOSPHATE 10 MG/ML IJ SOLN
INTRAMUSCULAR | Status: DC | PRN
  Administered 2023-07-01: 10 mg via INTRAVENOUS

## 2023-07-01 MED ORDER — CLINDAMYCIN PHOSPHATE 900 MG/50ML IV SOLN
900.0000 mg | INTRAVENOUS | Status: AC
  Administered 2023-07-01: 900 mg via INTRAVENOUS
  Filled 2023-07-01: qty 50

## 2023-07-01 MED ORDER — METOPROLOL TARTRATE 25 MG PO TABS
25.0000 mg | ORAL_TABLET | Freq: Two times a day (BID) | ORAL | Status: DC
  Administered 2023-07-01: 25 mg via ORAL
  Filled 2023-07-01 (×2): qty 1

## 2023-07-01 MED ORDER — PROPRANOLOL HCL 20 MG PO TABS: 20.0000 mg | ORAL_TABLET | Freq: Three times a day (TID) | ORAL | Status: DC | PRN

## 2023-07-01 MED ORDER — CHLORHEXIDINE GLUCONATE 0.12 % MT SOLN
15.0000 mL | Freq: Once | OROMUCOSAL | Status: AC
  Administered 2023-07-01: 15 mL via OROMUCOSAL
  Filled 2023-07-01: qty 15

## 2023-07-01 MED ORDER — BUSPIRONE HCL 10 MG PO TABS
10.0000 mg | ORAL_TABLET | Freq: Three times a day (TID) | ORAL | Status: DC
  Administered 2023-07-01 – 2023-07-02 (×2): 10 mg via ORAL
  Filled 2023-07-01 (×2): qty 1

## 2023-07-01 MED ORDER — POVIDONE-IODINE 10 % EX SWAB
2.0000 | Freq: Once | CUTANEOUS | Status: AC
  Administered 2023-07-01: 2 via TOPICAL

## 2023-07-01 MED ORDER — OXYCODONE HCL 5 MG PO TABS
5.0000 mg | ORAL_TABLET | ORAL | 0 refills | Status: DC | PRN
  Filled 2023-07-01: qty 30, 5d supply, fill #0

## 2023-07-01 MED ORDER — KETOROLAC TROMETHAMINE 30 MG/ML IJ SOLN
30.0000 mg | Freq: Four times a day (QID) | INTRAMUSCULAR | Status: DC
Start: 2023-07-02 — End: 2023-07-02
  Administered 2023-07-01 – 2023-07-02 (×2): 30 mg via INTRAVENOUS
  Filled 2023-07-01 (×2): qty 1

## 2023-07-01 MED ORDER — HYDROMORPHONE HCL 1 MG/ML IJ SOLN
INTRAMUSCULAR | Status: AC
Start: 2023-07-01 — End: ?
  Filled 2023-07-01: qty 1

## 2023-07-01 MED ORDER — LIDOCAINE 2% (20 MG/ML) 5 ML SYRINGE
INTRAMUSCULAR | Status: DC | PRN
  Administered 2023-07-01: 60 mg via INTRAVENOUS

## 2023-07-01 MED ORDER — IBUPROFEN 800 MG PO TABS
800.0000 mg | ORAL_TABLET | Freq: Three times a day (TID) | ORAL | 0 refills | Status: AC | PRN
  Filled 2023-07-01: qty 60, 20d supply, fill #0

## 2023-07-01 MED ORDER — ROCURONIUM BROMIDE 10 MG/ML (PF) SYRINGE
PREFILLED_SYRINGE | INTRAVENOUS | Status: DC | PRN
  Administered 2023-07-01: 20 mg via INTRAVENOUS
  Administered 2023-07-01: 60 mg via INTRAVENOUS

## 2023-07-01 MED ORDER — HYDROMORPHONE HCL 1 MG/ML IJ SOLN
0.2500 mg | INTRAMUSCULAR | Status: DC | PRN
  Administered 2023-07-01: 0.5 mg via INTRAVENOUS

## 2023-07-01 MED ORDER — MIDAZOLAM HCL 2 MG/2ML IJ SOLN
INTRAMUSCULAR | Status: DC | PRN
  Administered 2023-07-01: 2 mg via INTRAVENOUS

## 2023-07-01 MED ORDER — TRAZODONE HCL 50 MG PO TABS
100.0000 mg | ORAL_TABLET | Freq: Every evening | ORAL | Status: DC | PRN
  Administered 2023-07-01: 100 mg via ORAL
  Filled 2023-07-01: qty 2

## 2023-07-01 MED ORDER — COLESTIPOL HCL 1 G PO TABS
2.0000 g | ORAL_TABLET | Freq: Three times a day (TID) | ORAL | Status: DC
  Administered 2023-07-01 – 2023-07-02 (×2): 2 g via ORAL
  Filled 2023-07-01 (×2): qty 2

## 2023-07-01 MED ORDER — FENTANYL CITRATE (PF) 250 MCG/5ML IJ SOLN
INTRAMUSCULAR | Status: DC | PRN
  Administered 2023-07-01: 100 ug via INTRAVENOUS
  Administered 2023-07-01 (×3): 50 ug via INTRAVENOUS

## 2023-07-01 MED ORDER — ALBUTEROL SULFATE (2.5 MG/3ML) 0.083% IN NEBU: 3.0000 mL | INHALATION_SOLUTION | Freq: Four times a day (QID) | RESPIRATORY_TRACT | Status: DC | PRN

## 2023-07-01 MED ORDER — SUGAMMADEX SODIUM 200 MG/2ML IV SOLN
INTRAVENOUS | Status: DC | PRN
  Administered 2023-07-01: 400 mg via INTRAVENOUS

## 2023-07-01 MED ORDER — OXYCODONE HCL 5 MG PO TABS
5.0000 mg | ORAL_TABLET | ORAL | Status: DC | PRN
  Administered 2023-07-02: 10 mg via ORAL
  Filled 2023-07-01: qty 2

## 2023-07-01 MED ORDER — METOPROLOL TARTRATE 5 MG/5ML IV SOLN
INTRAVENOUS | Status: DC | PRN
  Administered 2023-07-01: 2.5 mg via INTRAVENOUS

## 2023-07-01 MED ORDER — SERTRALINE HCL 100 MG PO TABS
200.0000 mg | ORAL_TABLET | Freq: Every day | ORAL | Status: DC
  Administered 2023-07-01: 200 mg via ORAL
  Filled 2023-07-01: qty 2

## 2023-07-01 MED ORDER — ORAL CARE MOUTH RINSE: 15.0000 mL | Freq: Once | OROMUCOSAL | Status: AC

## 2023-07-01 MED ORDER — IBUPROFEN 200 MG PO TABS: 600.0000 mg | ORAL_TABLET | Freq: Four times a day (QID) | ORAL | Status: DC

## 2023-07-01 MED ORDER — GENTAMICIN SULFATE 40 MG/ML IJ SOLN
5.0000 mg/kg | INTRAMUSCULAR | Status: AC
  Administered 2023-07-01: 680 mg via INTRAVENOUS
  Filled 2023-07-01: qty 17

## 2023-07-01 MED ORDER — SODIUM CHLORIDE 0.9 % IV SOLN
INTRAVENOUS | Status: DC
Start: 2023-07-01 — End: 2023-07-02

## 2023-07-01 SURGICAL SUPPLY — 54 items
APPLICATOR ARISTA FLEXITIP XL (MISCELLANEOUS) IMPLANT
COVER BACK TABLE 60X90IN (DRAPES) IMPLANT
COVER MAYO STAND STRL (DRAPES) ×3 IMPLANT
DEFOGGER SCOPE WARM SEASHARP (MISCELLANEOUS) ×3 IMPLANT
DERMABOND ADVANCED .7 DNX12 (GAUZE/BANDAGES/DRESSINGS) ×3 IMPLANT
DRAPE SURG IRRIG POUCH 19X23 (DRAPES) ×3 IMPLANT
DURAPREP 26ML APPLICATOR (WOUND CARE) ×3 IMPLANT
GLOVE BIO SURGEON STRL SZ7 (GLOVE) ×6 IMPLANT
GLOVE SURG UNDER POLY LF SZ7 (GLOVE) ×12 IMPLANT
GOWN STRL REUS W/ TWL XL LVL3 (GOWN DISPOSABLE) ×9 IMPLANT
HEMOSTAT ARISTA ABSORB 3G PWDR (HEMOSTASIS) IMPLANT
HIBICLENS CHG 4% 4OZ BTL (MISCELLANEOUS) ×6 IMPLANT
IRRIGATION SUCT STRKRFLW 2 WTP (MISCELLANEOUS) ×3 IMPLANT
KIT PINK PAD W/HEAD ARE REST (MISCELLANEOUS) ×2 IMPLANT
KIT PINK PAD W/HEAD ARM REST (MISCELLANEOUS) ×3 IMPLANT
KIT TURNOVER KIT B (KITS) ×3 IMPLANT
LIGASURE VESSEL 5MM BLUNT TIP (ELECTROSURGICAL) ×3 IMPLANT
NDL INSUFFLATION 14GA 120MM (NEEDLE) ×3 IMPLANT
NDL SPNL 22GX3.5 QUINCKE BK (NEEDLE) ×3 IMPLANT
NEEDLE INSUFFLATION 14GA 120MM (NEEDLE) ×2 IMPLANT
NEEDLE SPNL 22GX3.5 QUINCKE BK (NEEDLE) ×2 IMPLANT
NS IRRIG 1000ML POUR BTL (IV SOLUTION) ×3 IMPLANT
OCCLUDER COLPOPNEUMO (BALLOONS) IMPLANT
PACK LAPAROSCOPY BASIN (CUSTOM PROCEDURE TRAY) ×3 IMPLANT
POUCH LAPAROSCOPIC INSTRUMENT (MISCELLANEOUS) ×3 IMPLANT
SCALPEL HRMNC RUM II 2.5 SILVR (DISPOSABLE) IMPLANT
SCALPEL HRMNC RUM II 3.0 SILVR (DISPOSABLE) IMPLANT
SCALPEL HRMNC RUM II 3.5 SILVR (DISPOSABLE) IMPLANT
SCALPEL HRMNC RUM II 4.0 SILVR (DISPOSABLE) IMPLANT
SCISSORS LAP 5X35 DISP (ENDOMECHANICALS) IMPLANT
SET CYSTO W/LG BORE CLAMP LF (SET/KITS/TRAYS/PACK) ×3 IMPLANT
SET TRI-LUMEN FLTR TB AIRSEAL (TUBING) ×3 IMPLANT
SHEARS HARMONIC 36 ACE (MISCELLANEOUS) IMPLANT
SLEEVE ADV FIXATION 5X100MM (TROCAR) ×3 IMPLANT
SUT VIC AB 0 CT1 27XBRD ANBCTR (SUTURE) IMPLANT
SUT VIC AB 4-0 PS2 18 (SUTURE) ×6 IMPLANT
SUT VICRYL 0 UR6 27IN ABS (SUTURE) IMPLANT
SUT VLOC 180 0 9IN GS21 (SUTURE) ×3 IMPLANT
SYR 10ML LL (SYRINGE) ×3 IMPLANT
SYR 50ML LL SCALE MARK (SYRINGE) ×3 IMPLANT
SYR CONTROL 10ML LL (SYRINGE) ×3 IMPLANT
TIP RUMI ORANGE 6.7MMX12CM (TIP) IMPLANT
TIP UTERINE 6.7X10CM GRN DISP (MISCELLANEOUS) IMPLANT
TIP UTERINE 6.7X8CM BLUE DISP (MISCELLANEOUS) IMPLANT
TOWEL GREEN STERILE FF (TOWEL DISPOSABLE) ×6 IMPLANT
TRAY FOLEY W/BAG SLVR 14FR (SET/KITS/TRAYS/PACK) ×3 IMPLANT
TROCAR ADV FIXATION 11X100MM (TROCAR) IMPLANT
TROCAR ADV FIXATION 5X100MM (TROCAR) ×3 IMPLANT
TROCAR BALLN GELPORT 12X130M (ENDOMECHANICALS) IMPLANT
TROCAR PORT AIRSEAL 8X120 (TROCAR) ×3 IMPLANT
TROCAR XCEL NON BLADE 8MM B8LT (ENDOMECHANICALS) ×3 IMPLANT
TROCAR XCEL NON-BLD 5MMX100MML (ENDOMECHANICALS) ×3 IMPLANT
UNDERPAD 30X36 HEAVY ABSORB (UNDERPADS AND DIAPERS) ×3 IMPLANT
WARMER LAPAROSCOPE (MISCELLANEOUS) ×3 IMPLANT

## 2023-07-01 NOTE — TOC Initial Note (Signed)
 Transition of Care [facility]) - Initial/Assessment Note    Patient Details  Name: Kristen [name] MRN: [id number] Date of Birth: 13-Oct-1985  [facility]) CM/SW Contact:    Omie Bickers, RN Phone Number: 07/01/2023, 4:37 PM  Clinical Narrative:                  Patient admitted from home. Hysterectomy. Insured, has PCP, no DC needs identified.    Expected Discharge Plan: Home/Self Care Barriers to Discharge: Continued Medical Work up   Patient Goals and CMS Choice            Expected Discharge Plan and Services       Living arrangements for the past 2 months: Single Family Home                                      Prior Living Arrangements/Services Living arrangements for the past 2 months: Single Family Home                     Activities of Daily Living      Permission Sought/Granted                  Emotional Assessment              Admission diagnosis:  Dysfunctional uterine bleeding [N93.8] Abnormal uterine bleeding [N93.9] Patient Active Problem List   Diagnosis Date Noted   Abnormal uterine bleeding 07/01/2023   Melena 02/25/2023   Abdominal pain, chronic, epigastric 02/25/2023   Diarrhea    Chronic diarrhea    Weight gain 11/01/2020   Hirsutism 11/01/2020   Gastroesophageal reflux disease without esophagitis 07/11/2020   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   POTS (postural orthostatic tachycardia syndrome) 06/09/2020   Morbid obesity (HCC) 12/14/2019   Endometrial polyp    Encounter for autism screening 11/18/2019   DUB (dysfunctional uterine bleeding) 11/08/2019   PTSD (post-traumatic stress disorder) 09/20/2019   Irritable bowel syndrome with diarrhea 04/23/2019   Abnormal serum thyroid  stimulating hormone (TSH) level 04/23/2019   OSA on CPAP 03/17/2019   Anxiety and depression 03/12/2019   Chronic migraine with aura 03/12/2019   Essential hypertension 03/12/2019   Mild intermittent asthma without  complication 03/12/2019   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   Elevated prolactin level 07/05/2016   Vulvar lesion 07/05/2016   Restless legs 05/24/2016   Seasonal allergies 05/13/2014   Intertrigo 05/13/2014   Decreased vision 05/13/2014   PCOS (polycystic ovarian syndrome) 02/07/2014   Social phobia 02/02/2007   Attention deficit disorder 02/02/2007   DYSLEXIA 02/02/2007   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 [place] [address] Phone: [phone] Fax: [phone]  [id number] [place], [place] - [address] [facility] [address] [place] [address] Phone: [phone] Fax: [phone]  [facility] - [place], [place] - [address] [place] [address] Phone: [phone] Fax: [phone]  [facility] [address] [place] [address] Phone: [phone] Fax: [phone]     Social Drivers of Health (SDOH) Social History: SDOH Screenings   Food Insecurity: Food Insecurity Present (  06/18/2023)  Housing: Low Risk  (12/30/2022)  Transportation Needs: No Transportation Needs (06/18/2023)  Utilities: Not At Risk (12/30/2022)  Alcohol Screen: Low Risk  (12/30/2022)  Depression (PHQ2-9): Medium Risk (06/18/2023)  Financial Resource Strain: High Risk (12/30/2022)  Physical Activity: Insufficiently Active (12/30/2022)  Social Connections: Socially Isolated (12/30/2022)  Stress: Stress Concern Present (12/30/2022)  Tobacco Use: Low Risk  (07/01/2023)  Health Literacy: Adequate Health Literacy (12/30/2022)   SDOH Interventions:     Readmission Risk Interventions     No data to display

## 2023-07-01 NOTE — Brief Op Note (Signed)
 07/01/2023  4:05 PM  PATIENT:  Vasti Yagi  38 y.o. female  PRE-OPERATIVE DIAGNOSIS:  abnormal uterine bleeding  POST-OPERATIVE DIAGNOSIS:  abnormal uterine bleeding  PROCEDURE:  Procedure(s): HYSTERECTOMY, TOTAL, LAPAROSCOPIC, WITH SALPINGECTOMY (Bilateral) CYSTOSCOPY (N/A)  SURGEON:  Surgeons and Role:    Kiki Pelton, MD - Primary    * Izell Marsh, MD - Assisting  PHYSICIAN ASSISTANT: Kirstie Percy, PA  ASSISTANTS: as above   ANESTHESIA:   general and laparoscopic TAP block  EBL:  100 mL   BLOOD ADMINISTERED:none  DRAINS: none   LOCAL MEDICATIONS USED:  OTHER BUPIVICAINE WITH EXPAREL   SPECIMEN:  Source of Specimen:  UTERUS, CERVIX, BILATERAL FALLOPIAN TUBES  DISPOSITION OF SPECIMEN:  PATHOLOGY  COUNTS:  YES  TOURNIQUET:  * No tourniquets in log *  DICTATION: .Note written in EPIC  PLAN OF CARE: extended recovery  PATIENT DISPOSITION:  PACU - hemodynamically stable.   Delay start of Pharmacological VTE agent (>24hrs) due to surgical blood loss or risk of bleeding: not applicable

## 2023-07-01 NOTE — Progress Notes (Signed)
 Discharge medications given to patient mother, Kate Larock.

## 2023-07-01 NOTE — Discharge Instructions (Signed)
 Post Op Hysterectomy Instructions Please read the instructions below. Refer to these instructions for the next few weeks. These instructions provide you with general information on caring for yourself after surgery. Your caregiver may also give you specific instructions. While your treatment has been planned according to the most current medical practices available, unavoidable problems sometimes happen. If you have any problems or questions after you leave, please call your caregiver.  HOME CARE INSTRUCTIONS Healing will take time. You will have discomfort, tenderness, swelling and bruising at the operative site for a couple of weeks. This is normal and will get better as time goes on.  Only take over-the-counter or prescription medicines for pain, discomfort or fever as directed by your caregiver.  Do not take aspirin. It can cause bleeding.  Do not drive when taking pain medication.  Follow your caregiver's advice regarding diet, exercise, lifting, driving and general activities.  Resume your usual diet as directed and allowed.  Get plenty of rest and sleep.  Do not douche, use tampons, or have sexual intercourse until your caregiver gives you permission. .  Take your temperature if you feel hot or flushed.  You may shower today when you get home.  No tub bath for one week.   Do not drink alcohol until you are not taking any narcotic pain medications.  Try to have someone home with you for a week or two to help with the household activities.   Be careful over the next two to three weeks with any activities at home that involve lifting, pushing, or pulling.  Listen to your body--if something feels uncomfortable to do, then don't do it. Make sure you and your family understands everything about your operation and recovery.  Walking up stairs is fine. Do not sign any legal documents until you feel normal again.  Keep all your follow-up appointments as recommended by your caregiver.   PLEASE CALL  THE OFFICE IF: There is swelling, redness or increasing pain in the wound area.  Pus is coming from the wound.  You notice a bad smell from the wound or surgical dressing.  You have pain, redness and swelling from the intravenous site.  The wound is breaking open (the edges are not staying together).   You develop pain or bleeding when you urinate.  You develop abnormal vaginal discharge.  You have any type of abnormal reaction or develop an allergy to your medication.  You need stronger pain medication for your pain   SEEK IMMEDIATE MEDICAL CARE: You develop a temperature of 100.5 or higher.  You develop abdominal pain.  You develop chest pain.  You develop shortness of breath.  You pass out.  You develop pain, swelling or redness of your leg.  You develop heavy vaginal bleeding with or without blood clots.   MEDICATIONS: Restart your regular medications BUT wait one week before restarting all vitamins and mineral supplements Use Motrin 800mg  every 8 hours for the next several days.   Take Tylenol 1000mg  every 8 hours for the next several days Use oxycodone 5 mg every 4-6 hours. Taking motrin and tylenol should help reduce how often you use oxycodone.  You may use an over the counter stool softener like Colace or Dulcolax to help with starting a bowel movement.  You can also use miralax (polyethylene glycol). Start the day after you go home.  Warm liquids, fluids, and ambulation help too.  If you have not had a bowel movement in four days, you need to  call the office.

## 2023-07-01 NOTE — Anesthesia Procedure Notes (Signed)
 Procedure Name: Intubation Date/Time: 07/01/2023 1:11 PM  Performed by: Lorrayne Rosier, RNPre-anesthesia Checklist: Patient identified, Emergency Drugs available, Suction available and Patient being monitored Patient Re-evaluated:Patient Re-evaluated prior to induction Oxygen Delivery Method: Circle System Utilized Preoxygenation: Pre-oxygenation with 100% oxygen Induction Type: IV induction Ventilation: Mask ventilation without difficulty Laryngoscope Size: Mac and 3 Grade View: Grade II Tube type: Oral Tube size: 7.0 mm Number of attempts: 1 Airway Equipment and Method: Stylet Placement Confirmation: ETT inserted through vocal cords under direct vision, positive ETCO2 and breath sounds checked- equal and bilateral Secured at: 23 cm Tube secured with: Tape Dental Injury: Teeth and Oropharynx as per pre-operative assessment

## 2023-07-01 NOTE — Plan of Care (Signed)

## 2023-07-01 NOTE — OR Nursing (Signed)
 IUD WAS REMOVED DURING SURGERY

## 2023-07-01 NOTE — Interval H&P Note (Signed)
 History and Physical Interval Note:  07/01/2023 12:58 PM  Kristen [name]  has presented today for surgery, with the diagnosis of abnormal uterine bleeding.  The various methods of treatment have been discussed with the patient and family. After consideration of risks, benefits and other options for treatment, the patient has consented to  Procedure(s): HYSTERECTOMY, TOTAL, LAPAROSCOPIC, WITH SALPINGECTOMY (Bilateral) CYSTOSCOPY (N/A) as a surgical intervention.  The patient's history has been reviewed, patient examined, no change in status, stable for surgery.  I have reviewed the patient's chart and labs.  Questions were answered to the patient's satisfaction.     Araceli Coufal

## 2023-07-01 NOTE — Anesthesia Preprocedure Evaluation (Addendum)
 Anesthesia Evaluation  Patient identified by MRN, date of birth, ID band Patient awake    Reviewed: Allergy  & Precautions, H&P , NPO status , Patient's Chart, lab work & pertinent test results, reviewed documented beta blocker date and time   Airway Mallampati: III  TM Distance: >3 FB Neck ROM: Full    Dental no notable dental hx. (+) Teeth Intact, Dental Advisory Given   Pulmonary asthma , sleep apnea    Pulmonary exam normal breath sounds clear to auscultation       Cardiovascular hypertension, Pt. on medications and Pt. on home beta blockers + dysrhythmias  Rhythm:Regular Rate:Normal     Neuro/Psych  Headaches  Anxiety Depression       GI/Hepatic Neg liver ROS, hiatal hernia,GERD  Medicated,,  Endo/Other    Class 3 obesity  Renal/GU   negative genitourinary   Musculoskeletal   Abdominal   Peds  Hematology negative hematology ROS (+)   Anesthesia Other Findings   Reproductive/Obstetrics negative OB ROS                             Anesthesia Physical Anesthesia Plan  ASA: 3  Anesthesia Plan: General   Post-op Pain Management: Tylenol  PO (pre-op)*   Induction: Intravenous  PONV Risk Score and Plan: 4 or greater and Ondansetron , Dexamethasone  and Midazolam   Airway Management Planned: Oral ETT  Additional Equipment:   Intra-op Plan:   Post-operative Plan: Extubation in OR  Informed Consent: I have reviewed the patients History and Physical, chart, labs and discussed the procedure including the risks, benefits and alternatives for the proposed anesthesia with the patient or authorized representative who has indicated his/her understanding and acceptance.     Dental advisory given  Plan Discussed with: CRNA  Anesthesia Plan Comments:        Anesthesia Quick Evaluation

## 2023-07-01 NOTE — Op Note (Signed)
 [facility] PROCEDURE DATE: 07/01/2023  PREOPERATIVE DIAGNOSIS: abnormal uterine bleeding, dysmenorrhea  POSTOPERATIVE DIAGNOSIS: abnormal uterine bleeding, dysmenorrhea PROCEDURE:    total laparoscopic hysterectomy, bilateral salpingectomy, cystoscopy SURGEON: Kiki Pelton, MD ASSISTANT:  Merideth Stands, MD    An experienced assistant was required given the standard of surgical care given the complexity of the case.  This assistant was needed for exposure, dissection, suctioning, retraction, instrument exchange, and for overall help during the procedure.  INDICATIONS: 38 y.o. G0P0 with abnormal uterine bleeding, .  Risks of surgery were discussed with the patient including but not limited to: bleeding which may require transfusion; infection which may require antibiotics; injury to surrounding organs; need for additional procedures including laparotomy;  and other postoperative/anesthesia complications. Written informed consent was obtained.    FINDINGS:  Normal external genitalia, 8 wk size mobile uterus with Normal contours.  Laparoscopically: normal upper abdominal survey, normal appearing uterus, normal bilateral fallopian tubes, normal ovaries, normal anterior cul de sac, normal posterior cul de sac Cystoscopically: normal bladder wall without apparent injury, bilateral ureteral orifices, clear urine from singular left ureter and duplicated right ureters  ANESTHESIA: General INTRAVENOUS FLUIDS:  1000 ml of LR ESTIMATED BLOOD LOSS:  100 ml URINE OUTPUT: 500 ml SPECIMENS: uterus, cervix, bilateral fallopian tubes COMPLICATIONS:  None immediate.  Attempted idirect entry but not able to confirm. Attempted veress through umbilicus without success. Open hassan technique to enter.   PROCEDURE: The risks, benefits, and alternatives of surgery were explained, understood, and accepted. Consents were signed. All questions were answered. She was taken to the operating room and general  anesthesia was applied without complication. She was placed in the dorsal lithotomy position and her abdomen and vagina were prepped and draped after she had been carefully positioned on the table.  A Foley catheter was placed and it drained clear throughout the case. A speculum was placed and the cervix visualized. The cervix was measured and the uterus was sounded. A Rumi uterine manipulator using a 3.0 cm cup and 6cm tip was placed without difficulty.  Gloves were changed and attention was turned to the abdomen. All incisions were infiltrated with local anesthetic. A 5mm incision was made in the umbilicus and an optiview trocar was introduced into the abdomen. Intraabdominal entry was not able to be achieved after ~3 attempts, the veress needle was then used to gain intra-abdominal entry but was also unsuccessful. Finally, the abdominal cavity was entered using an open technique in which the umbilical stalk was identified and followed to the base and the fascia identified, elevated and sharply entered. The edges of the fascia were tagged and the peritoneum bluntly entered and a 10mm trocar was introduced into the abdomen. The Entry was confirmed with direct visualization and the abdomen was then insufflated. After good pneumoperitoneum was established, the abdomen was surveyed including the upper abdomen.She was placed in Trendelenburg position and ports were placed in the RLQ, LLQ, and LUQ including an 8mm airseal.    The right mesosalpinx was serially cauterized and cut and removed from the abdomen. The uteroovarian ligament was cauterized and divided. The round ligament was divided. The leaves of the broad ligament were separated and the anterior leaf dissected down to the level of the Koh ring. The posterior leaf was dissected and the uterine vessels were skeletonized. The uterine vessels were cauterized, divided, and lateralized to the cup edge. Attention was turned to the left side where the same was  completed.  The bladder was pushed from the  operative site and the tissue dissected down to the pubocervical fascia. Once adequately mobilized, the colpotomy was completed using harmonic scalpel device and the uterus removed from the pelvis.    The vaginal cuff was then closed using 0 Vloc in a running fashion. The pelvis was irrigated. All pedicles were inspected. No bleeding was noted.   CO2 pressures were lowered to 7mm Hg.  Again, no bleeding was noted. Arista was placed along the pedicles.  At this point the procedure was completed.  The remaining instruments were removed.  The patient was taken out of Trendelenburg positioning.    Attention was then turned the vagina and the cuff was inspected. No bleeding was noted. The Foley catheter was removed.  Cystoscopy was performed.  No sutures or bladder injuries were noted.  Ureters were noted with efflux urine from all visualized ureteral orifices.  Foley was left out after the cystoscopic fluid was drained and cystoscope removed. Attention was returned to the abdomen. The fascia was closed in a running fashion using 0 vicryl. There was bleeding noted deep to the fascia while closing and this was treated with arista. Once the fascie was closed, the sutures used to tag the fascia were tied. The skin was then closed with subcuticular stitches of 4-0 Vicryl. The skin was cleansed Dermabond was applied. Sponge, lap, needle, instrument counts were correct x2. Patient tolerated the procedure very well. She was awakened from anesthesia, extubated and taken to recovery in stable condition.    Kiki Pelton, MD Minimally Invasive Gynecologic Surgery  Obstetrics and Gynecology, [facility] for [facility], [facility] 07/01/2023

## 2023-07-01 NOTE — Anesthesia Postprocedure Evaluation (Signed)
 Anesthesia Post Note  Patient: Kristen [name]  Procedure(s) Performed: HYSTERECTOMY, TOTAL, LAPAROSCOPIC, WITH SALPINGECTOMY (Bilateral: Abdomen) CYSTOSCOPY (Bladder)     Patient location during evaluation: PACU Anesthesia Type: General Level of consciousness: awake and alert Pain management: pain level controlled Vital Signs Assessment: post-procedure vital signs reviewed and stable Respiratory status: spontaneous breathing, nonlabored ventilation and respiratory function stable Cardiovascular status: blood pressure returned to baseline and stable Postop Assessment: no apparent nausea or vomiting Anesthetic complications: no   No notable events documented.  Last Vitals:  Vitals:   07/01/23 1700 07/01/23 1715  BP: (!) 153/90 135/89  Pulse: 66 73  Resp: 12 11  Temp:    SpO2: 96% 93%    Last Pain:  Vitals:   07/01/23 1704  TempSrc:   PainSc: 9    Pain Goal: Patients Stated Pain Goal: 0 (07/01/23 1108)                 Earvin Goldberg

## 2023-07-01 NOTE — Transfer of Care (Signed)
 Immediate Anesthesia Transfer of Care Note  Patient: Kristen [name]  Procedure(s) Performed: HYSTERECTOMY, TOTAL, LAPAROSCOPIC, WITH SALPINGECTOMY (Bilateral: Abdomen) CYSTOSCOPY (Bladder)  Patient Location: PACU  Anesthesia Type:General  Level of Consciousness: drowsy and patient cooperative  Airway & Oxygen Therapy: Patient Spontanous Breathing and Patient connected to face mask oxygen  Post-op Assessment: Report given to RN, Post -op Vital signs reviewed and stable, Patient moving all extremities, Patient moving all extremities X 4, and Patient able to stick tongue midline  Post vital signs: Reviewed and stable  Last Vitals:  Vitals Value Taken Time  BP 156/94 07/01/23 1609  Temp    Pulse 82 07/01/23 1614  Resp 16 07/01/23 1614  SpO2 91 % 07/01/23 1614  Vitals shown include unfiled device data.  Last Pain:  Vitals:   07/01/23 1123  TempSrc:   PainSc: 0-No pain      Patients Stated Pain Goal: 0 (07/01/23 1108)  Complications: No notable events documented.

## 2023-07-02 ENCOUNTER — Encounter (HOSPITAL_COMMUNITY): Payer: Self-pay | Admitting: Obstetrics and Gynecology

## 2023-07-02 DIAGNOSIS — N939 Abnormal uterine and vaginal bleeding, unspecified: Secondary | ICD-10-CM | POA: Diagnosis not present

## 2023-07-02 NOTE — Progress Notes (Signed)
 DISCHARGE NOTE HOME Kristen [name] to be discharged Home per MD order. Discussed prescriptions and follow up appointments with the patient. Prescriptions given to patient; medication list explained in detail. Patient verbalized understanding.  Skin clean, dry and intact without evidence of skin break down, no evidence of skin tears noted. IV catheter discontinued intact. Site without signs and symptoms of complications. Dressing and pressure applied. Pt denies pain at the site currently. No complaints noted.  See LDA for incisions from surgery.  An After Visit Summary (AVS) was printed and given to the patient. Patient escorted via wheelchair, and discharged home via private auto.  Tonda Francisco, RN

## 2023-07-02 NOTE — Progress Notes (Signed)
 Gynecology Progress Note  Admission Date: 07/01/2023 Current Date: 07/02/2023 7:59 AM  Kristen [name] is a 38 y.o. G0P0 HD#2/pod#1 admitted for postop obs   History complicated by: Patient Active Problem List   Diagnosis Date Noted   Abnormal uterine bleeding 07/01/2023   Abnormal uterine bleeding (AUB) 07/01/2023   Melena 02/25/2023   Abdominal pain, chronic, epigastric 02/25/2023   Diarrhea    Chronic diarrhea    Weight gain 11/01/2020   Hirsutism 11/01/2020   Gastroesophageal reflux disease without esophagitis 07/11/2020   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   POTS (postural orthostatic tachycardia syndrome) 06/09/2020   Morbid obesity (HCC) 12/14/2019   Endometrial polyp    Encounter for autism screening 11/18/2019   DUB (dysfunctional uterine bleeding) 11/08/2019   PTSD (post-traumatic stress disorder) 09/20/2019   Irritable bowel syndrome with diarrhea 04/23/2019   Abnormal serum thyroid  stimulating hormone (TSH) level 04/23/2019   OSA on CPAP 03/17/2019   Anxiety and depression 03/12/2019   Chronic migraine with aura 03/12/2019   Essential hypertension 03/12/2019   Mild intermittent asthma without complication 03/12/2019   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   Elevated prolactin level 07/05/2016   Vulvar lesion 07/05/2016   Restless legs 05/24/2016   Seasonal allergies 05/13/2014   Intertrigo 05/13/2014   Decreased vision 05/13/2014   PCOS (polycystic ovarian syndrome) 02/07/2014   Social phobia 02/02/2007   Attention deficit disorder 02/02/2007   DYSLEXIA 02/02/2007   Idiopathic scoliosis and kyphoscoliosis 02/02/2007   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 Tolerating po. No vaginal bleeding. Voiding with mild dysuria. Ambulating with some assistance. Mild nausea previously without vomiting which has improved. No chest pain or SOB.   Objective:     24h Vital Sign Ranges  T 98.9 F (37.2 C) Temp  Avg: 97.7 F (36.5 C)  Min: 96.9 F (36.1 C)  Max: 98.9 F (37.2 C)  BP 110/64 BP  Min: 109/62  Max: 156/94  HR 89 Pulse  Avg: 75.5  Min: 55  Max: 89  RR 16 Resp  Avg: 14.6  Min: 11  Max: 18  SaO2 93 % Room Air SpO2  Avg: 95 %  Min: 91 %  Max: 100 %       24 Hour I/O Current Shift I/O  Time Ins Outs 05/27 0701 - 05/28 0700 In: 1167 [I.V.:1000] Out: 600 [Urine:500] No intake/output data recorded.   Patient Vitals for the past 12 hrs:  BP Temp Temp src Pulse Resp SpO2  07/02/23 0415 110/64 98.9 F (37.2 C) Oral 89 16 93 %     Patient Vitals for the past 24 hrs:  BP Temp Temp src Pulse Resp SpO2 Height Weight  07/02/23 0415 110/64 98.9 F (37.2 C) Oral 89 16 93 % -- --  07/01/23 1944 (!) 144/92 (!) 97.5 F (36.4 C) Oral 73 16 100 % -- --  07/01/23 1749 (!) 124/93 98 F (36.7 C) Oral 72 16 96 % -- --  07/01/23 1730 118/85 (!) 97.1 F (36.2 C) -- 76 14 95 % -- --  07/01/23 1715 135/89 -- -- 73 11 93 % -- --  07/01/23 1700 (!) 153/90 -- -- 66  12 96 % -- --  07/01/23 1645 (!) 149/93 -- -- 77 12 94 % -- --  07/01/23 1630 (!) 130/91 -- -- 80 16 91 % -- --  07/01/23 1615 (!) 147/95 -- -- 84 16 92 % -- --  07/01/23 1609 (!) 156/94 (!) 96.9 F (36.1 C) -- 85 14 100 % -- --  07/01/23 1040 109/62 97.8 F (36.6 C) Oral (!) 55 18 95 % 5' 6.5" (1.689 m) 135.2 kg    Physical exam: General appearance: cooperative and no distress Abdomen: soft, abdominal binder in place, appropriately tender GU: No gross VB Lungs: clear to auscultation bilaterally Heart: S1, S2 normal, no murmur, rub or gallop, regular rate and rhythm Extremities: no lower  extremity edema Skin: incision intact Psych: appropriate Neurologic: Grossly normal  Medications Current Facility-Administered Medications  Medication Dose Route Frequency Provider Last Rate Last Admin   0.9 %  sodium chloride  infusion   Intravenous Continuous Jesly Hartmann, MD       albuterol  (PROVENTIL ) (2.5 MG/3ML) 0.083% nebulizer solution 3 mL  3 mL Inhalation Q6H PRN Sonora Catlin, MD       busPIRone  (BUSPAR ) tablet 10 mg  10 mg Oral TID Bently Morath, MD   10 mg at 07/01/23 2150   colestipol  (COLESTID ) tablet 2 g  2 g Oral TID Kelvis Berger, MD   2 g at 07/01/23 2355   gabapentin  (NEURONTIN ) capsule 300 mg  300 mg Oral QHS Kendrik Mcshan, MD   300 mg at 07/01/23 2150   HYDROmorphone  (DILAUDID ) injection 0.2-0.6 mg  0.2-0.6 mg Intravenous Q2H PRN Caelie Remsburg, MD   0.6 mg at 07/01/23 2147   hydrOXYzine  (ATARAX ) tablet 25 mg  25 mg Oral QID Gildo Crisco, MD   25 mg at 07/01/23 2150   ketorolac  (TORADOL ) 30 MG/ML injection 30 mg  30 mg Intravenous Q6H Jaryd Drew, MD   30 mg at 07/02/23 0528   Followed by   Cecily Cohen ON 07/03/2023] ibuprofen  (ADVIL ) tablet 600 mg  600 mg Oral Q6H Quinne Pires, MD       ipratropium-albuterol  (DUONEB) 0.5-2.5 (3) MG/3ML nebulizer solution 3 mL  3 mL Nebulization Q6H PRN Donavon Kimrey, MD       metoprolol  tartrate (LOPRESSOR ) tablet 25 mg  25 mg Oral BID Sevyn Markham, MD   25 mg at 07/01/23 2150   ondansetron  (ZOFRAN ) tablet 4 mg  4 mg Oral Q6H PRN Burnell Hurta, MD       Or   ondansetron  (ZOFRAN ) injection 4 mg  4 mg Intravenous Q6H PRN Khan Chura, MD       oxyCODONE  (Oxy IR/ROXICODONE ) immediate release tablet 5-10 mg  5-10 mg Oral Q4H PRN Kemari Mares, MD       polyethylene glycol (MIRALAX / GLYCOLAX) packet 17 g  17 g Oral Daily PRN Ingrid Shifrin, MD       propranolol  (INDERAL ) tablet 20 mg  20 mg Oral TID PRN Jack Mineau, MD       QUEtiapine  (SEROQUEL ) tablet 100 mg   100 mg Oral QHS Raaga Maeder, MD   100 mg at 07/01/23 2356   sertraline  (ZOLOFT ) tablet 200 mg  200 mg Oral QHS Benay Pomeroy, MD   200 mg at 07/01/23 2150   simethicone (MYLICON) chewable tablet 80 mg  80 mg Oral QID PRN Mirtie Bastyr, MD       traZODone  (DESYREL ) tablet 100 mg  100 mg Oral QHS PRN Dontarius Sheley, MD   100 mg at 07/01/23 2150  Labs  No results for input(s): "WBC", "HGB", "HCT", "PLT" in the last 168 hours.  No results for input(s): "NA", "K", "CL", "CO2", "BUN", "CREATININE", "CALCIUM", "PROT", "BILITOT", "ALKPHOS", "ALT", "AST", "GLUCOSE" in the last 168 hours.  Invalid input(s): "LABALBU"   Assessment & Plan:   *GYN: follow up surgical pathology *Pain: improved with scheduled  *FEN/GI: regular diet, tolerating po, SLIV *Cardio: continue metoprolol  *pulm: prn albuterol  *Neuro: continue home meds *PPx: SCDs *Dispo: discharge this AM  Code Status: Full Code   Kiki Pelton, MD Minimally Invasive Gynecologic [facility] for [facility] (Faculty Practice) 7:59 AM pager [phone] cell phone [phone].

## 2023-07-02 NOTE — TOC Transition Note (Signed)
 Transition of Care [facility]) - Discharge Note   Patient Details  Name: Kristen [name] MRN: [id number] Date of Birth: Jul 03, 1985  [facility] CM/SW Contact:  Jeani Mill, RN Phone Number: 07/02/2023, 9:03 AM   Clinical Narrative:    Patient stable to discharge home.  No TOC needs at this time.    Final next level of care: Home/Self Care Barriers to Discharge: Barriers Resolved   Patient Goals and CMS Choice Patient states their goals for this hospitalization and ongoing recovery are:: return home          Discharge Placement             Home          Discharge Plan and Services Additional resources added to the After Visit Summary for                                       Social Drivers of Health (SDOH) Interventions SDOH Screenings   Food Insecurity: Food Insecurity Present (06/18/2023)  Housing: Low Risk  (12/30/2022)  Transportation Needs: No Transportation Needs (06/18/2023)  Utilities: Not At Risk (12/30/2022)  Alcohol Screen: Low Risk  (12/30/2022)  Depression (PHQ2-9): Medium Risk (06/18/2023)  Financial Resource Strain: High Risk (12/30/2022)  Physical Activity: Insufficiently Active (12/30/2022)  Social Connections: Socially Isolated (12/30/2022)  Stress: Stress Concern Present (12/30/2022)  Tobacco Use: Low Risk  (07/01/2023)  Health Literacy: Adequate Health Literacy (12/30/2022)     Readmission Risk Interventions     No data to display

## 2023-07-02 NOTE — Discharge Summary (Signed)
 Gynecology Physician Postoperative Discharge Summary  Patient ID: Kristen [name] MRN: [id number] DOB/AGE: 36-Jun-1987 36 y.o.  Admit Date: 07/01/2023 Discharge Date: 07/02/2023  Preoperative Diagnoses: abnormal uterine bleeding, dysmenorrhea  Procedures: Procedure(s) (LRB): HYSTERECTOMY, TOTAL, LAPAROSCOPIC, WITH SALPINGECTOMY (Bilateral) CYSTOSCOPY (N/A)  Hospital Course:  Kristen [name] is a 38 y.o. G0P0  admitted for scheduled surgery.  She underwent the procedures as mentioned above, her operation was uncomplicated. For further details about surgery, please refer to the operative report. Patient had an uncomplicated postoperative course. By time of discharge on POD#1, her pain was controlled on oral pain medications; she was ambulating, voiding without difficulty, tolerating regular diet and passing flatus. She was deemed stable for discharge to home.   Significant Labs:    Latest Ref Rng & Units 06/24/2023    2:25 PM 02/09/2023    6:57 PM 01/21/2023    8:12 AM  CBC  WBC 4.0 - 10.5 K/uL 8.5  7.7    Hemoglobin 12.0 - 15.0 g/dL 40.9  81.1  91.4   Hematocrit 36.0 - 46.0 % 39.9  36.7  38.0   Platelets 150 - 400 K/uL 247  212      Discharge Exam: Blood pressure 100/60, pulse 85, temperature 98 F (36.7 C), resp. rate 16, height 5' 6.5" (1.689 m), weight 135.2 kg, last menstrual period 06/23/2023, SpO2 93%. General appearance: alert and no distress  Resp: clear to auscultation bilaterally  Cardio: regular rate and rhythm  GI: soft, non-tender; bowel sounds normal; no masses, no organomegaly.  Incision: C/D/I, no erythema, no drainage noted Pelvic: scant blood on pad (done in presence of RN as chaperone)  Extremities: extremities normal, atraumatic, no cyanosis or edema and Homans sign is negative, no sign of DVT  Discharged Condition: Stable  Disposition: Discharge disposition: 01-Home or Self Care       Discharge Instructions     Call MD for:  difficulty  breathing, headache or visual disturbances   Complete by: As directed    Call MD for:  hives   Complete by: As directed    Call MD for:  persistant dizziness or light-headedness   Complete by: As directed    Call MD for:  persistant nausea and vomiting   Complete by: As directed    Call MD for:  redness, tenderness, or signs of infection (pain, swelling, redness, odor or green/yellow discharge around incision site)   Complete by: As directed    Call MD for:  severe uncontrolled pain   Complete by: As directed    Call MD for:  temperature >100.4   Complete by: As directed    Diet - low sodium heart healthy   Complete by: As directed    Increase activity slowly   Complete by: As directed       Allergies as of 07/02/2023       Reactions   Latex Hives, Swelling   Mucinex [guaifenesin Er] Hives, Itching, Swelling   Other Anaphylaxis, Nausea Only   Pickles - throat swelling and nausea    Penicillins Swelling   Did it involve swelling of the face/tongue/throat, SOB, or low BP? Yes Did it involve sudden or severe rash/hives, skin peeling, or any reaction on the inside of your mouth or nose? No Did you need to seek medical attention at a hospital or doctor's office? Yes When did it last happen?      >10 years ago If all above answers are "NO", may proceed with cephalosporin use.  Contrast Media [iodinated Contrast Media] Nausea And Vomiting   Multihance  [gadobenate] Nausea And Vomiting        Medication List     TAKE these medications    Acetaminophen  Extra Strength 500 MG Tabs Commonly known as: TYLENOL  Take 1 tablet (500 mg total) by mouth every 6 (six) hours as needed.   albuterol  108 (90 Base) MCG/ACT inhaler Commonly known as: VENTOLIN  HFA Inhale 2 puffs into the lungs every 6 (six) hours as needed for wheezing or shortness of breath.   busPIRone  10 MG tablet Commonly known as: BUSPAR  Take 1 tablet (10 mg total) by mouth 3 (three) times daily.   CALCIUM 500 + D  PO Take 1 tablet by mouth daily.   colestipol  1 g tablet Commonly known as: Colestid  Take 2 tablets (2 g total) by mouth 3 (three) times daily.   diclofenac  Sodium 1 % Gel Commonly known as: Voltaren  Apply 2 g topically 4 (four) times daily. What changed:  when to take this reasons to take this   gabapentin  300 MG capsule Commonly known as: NEURONTIN  Take 1 capsule (300 mg total) by mouth at bedtime.   hydrOXYzine  25 MG tablet Commonly known as: ATARAX  Take 1 tablet (25 mg total) by mouth 4 (four) times daily.   ibuprofen  800 MG tablet Commonly known as: ADVIL  Take 1 tablet (800 mg total) by mouth 3 (three) times daily with meals as needed for headache, moderate pain (pain score 4-6) or cramping.   ipratropium-albuterol  0.5-2.5 (3) MG/3ML Soln Commonly known as: DUONEB Take 3 mLs by nebulization every 6 (six) hours as needed.   loperamide 2 MG tablet Commonly known as: IMODIUM A-D Take 2 mg by mouth 4 (four) times daily as needed for diarrhea or loose stools.   metoprolol  tartrate 25 MG tablet Commonly known as: LOPRESSOR  Take 1 tablet (25 mg total) by mouth 2 (two) times daily.   metoprolol  tartrate 25 MG tablet Commonly known as: LOPRESSOR  Take 1 tablet (25 mg total) by mouth 2 (two) times daily.   nystatin  powder Commonly known as: MYCOSTATIN /NYSTOP  Apply 1 Application topically 2 (two) times daily.   omeprazole  40 MG capsule Commonly known as: PRILOSEC Take 1 capsule (40 mg total) by mouth in the morning and at bedtime.   oxyCODONE  5 MG immediate release tablet Commonly known as: Oxy IR/ROXICODONE  Take 1 tablet (5 mg total) by mouth every 4 (four) hours as needed for severe pain (pain score 7-10) or breakthrough pain.   propranolol  10 MG tablet Commonly known as: INDERAL  Take 2 tablets (20 mg total) by mouth 3 (three) times daily as needed (palpitations).   QUEtiapine  100 MG tablet Commonly known as: SEROQUEL  Take 1 tablet (100 mg total) by mouth at  bedtime.   sertraline  100 MG tablet Commonly known as: ZOLOFT  Take 2 tablets (200 mg total) by mouth at bedtime.   SUMAtriptan  50 MG tablet Commonly known as: Imitrex  Take 1 tablet at start of headache. May repeat in 2 hours if headache persists or recurs. Max 2 tablets in 24 hours.   traZODone  100 MG tablet Commonly known as: DESYREL  Take 1 tablet (100 mg total) by mouth at bedtime as needed for sleep.   VAGISIL EX Apply 1 application. topically as needed.       Future Appointments  Date Time Provider Department Center  07/11/2023 10:00 AM Cozart, Angel Barba, [place] [facility] None  07/28/2023  3:30 PM Lawrance Presume, MD [facility] None  07/29/2023  4:00 PM Ladd Picker,  MD [facility] None  08/27/2023  3:20 PM Harden Leyden, PA-C [facility] None  09/03/2023  8:00 PM Lind Repine, MD [facility]  09/05/2023 11:00 AM Gates Kasal, PA [facility] None  09/25/2023 11:00 AM Aleck Hurdle, MD [facility] None  12/02/2023  3:15 PM Dellar Fenton, DO [facility] None     Total discharge time: 15 minutes   Signed:  Kiki Pelton, MD Minimally Invasive Gynecologic Surgery and Chronic Pelvic Pain Specialist Obstetrics and Gynecology, [facility] for [facility], [facility]

## 2023-07-03 ENCOUNTER — Ambulatory Visit: Payer: Self-pay | Admitting: Obstetrics and Gynecology

## 2023-07-03 LAB — SURGICAL PATHOLOGY

## 2023-07-07 ENCOUNTER — Other Ambulatory Visit: Payer: Self-pay

## 2023-07-07 DIAGNOSIS — K219 Gastro-esophageal reflux disease without esophagitis: Secondary | ICD-10-CM

## 2023-07-07 MED ORDER — OMEPRAZOLE 40 MG PO CPDR
40.0000 mg | DELAYED_RELEASE_CAPSULE | Freq: Two times a day (BID) | ORAL | 1 refills | Status: AC
Start: 2023-07-07 — End: ?

## 2023-07-07 NOTE — Telephone Encounter (Signed)
 Omeprazole  refill approved by Santina Cull PA and sent in to [facility].

## 2023-07-11 ENCOUNTER — Ambulatory Visit (INDEPENDENT_AMBULATORY_CARE_PROVIDER_SITE_OTHER): Admitting: Clinical

## 2023-07-11 DIAGNOSIS — F331 Major depressive disorder, recurrent, moderate: Secondary | ICD-10-CM | POA: Diagnosis not present

## 2023-07-12 NOTE — Progress Notes (Signed)
   THERAPIST PROGRESS NOTE Virtual Visit via Video Note  I connected with Kristen [name] on 07/11/2023 at 10:00 AM EDT by a video enabled telemedicine application and verified that I am speaking with the correct person using two identifiers.  Location: Patient: home Provider: office   I discussed the limitations of evaluation and management by telemedicine and the availability of in person appointments. The patient expressed understanding and agreed to proceed.   Follow Up Instructions: I discussed the assessment and treatment plan with the patient. The patient was provided an opportunity to ask questions and all were answered. The patient agreed with the plan and demonstrated an understanding of the instructions.   The patient was advised to call back or seek an in-person evaluation if the symptoms worsen or if the condition fails to improve as anticipated.   Session Time: 30 minutes  Participation Level: Active  Behavioral Response: CasualAlertDepressed  Type of Therapy: Individual Therapy  Treatment Goals addressed: client will attend at least 80% of scheduled individual psychotherapy sessions.  ProgressTowards Goals: Progressing  Interventions: CBT and Supportive  Summary:  Kristen [name] is a 38 y.o. female who presents for the scheduled appointment oriented times five, appropriately dressed and friendly. Client denied hallucinations and delusions. Client reported on today she is at home recovering from having a hysterectomy last week at [facility]. Client reported there was a miscommunication between her doctor giving orders to send her home and then someone coming after her doctor saying they were going to keep her. Client reported she was laying in the hospital in pain and a nurse told her mother "I'm not sure who she is but she not my patient". Client reported it wasn't until hours later a nurse helped her. Client reported her doctor came back and  said she didn't know she was still in the hospital. Client reported she will bring it back up to her doctor. Client reported otherwise she is doing okay. Evidence of progress towards goal:  client report 1 positive of self care 7 days per week.  Suicidal/Homicidal: Nowithout intent/plan  Therapist Response:  Therapist began the appointment asking the client how she has been doing since last seen. Therapist engaged with active listening and positive emotional support. Therapist used cbt to engage and ask how she is coping after having surgery. Therapist used cbt to positively reinforce the client advocating for herself. Therapist used CBT ask the client to identify her progress with frequency of use with coping skills with continued practice in her daily activity.    Therapist assigned the client homework to practice self care.   Plan: Return again in 4 weeks.  Diagnosis: moderate mdd  Collaboration of Care: Patient refused AEB none requested by the client.  Patient/Guardian was advised Release of Information must be obtained prior to any record release in order to collaborate their care with an outside provider. Patient/Guardian was advised if they have not already done so to contact the registration department to sign all necessary forms in order for us  to release information regarding their care.   Consent: Patient/Guardian gives verbal consent for treatment and assignment of benefits for services provided during this visit. Patient/Guardian expressed understanding and agreed to proceed.   Samaiyah Howes Y Jaquise Faux, LCSW 07/11/2023

## 2023-07-14 ENCOUNTER — Ambulatory Visit: Payer: Self-pay | Admitting: Gastroenterology

## 2023-07-14 ENCOUNTER — Encounter: Payer: Self-pay | Admitting: Obstetrics and Gynecology

## 2023-07-21 ENCOUNTER — Encounter: Payer: Self-pay | Admitting: Nurse Practitioner

## 2023-07-24 ENCOUNTER — Other Ambulatory Visit: Payer: Self-pay

## 2023-07-24 ENCOUNTER — Ambulatory Visit: Admitting: Obstetrics and Gynecology

## 2023-07-24 VITALS — BP 136/84 | HR 84 | Wt 296.0 lb

## 2023-07-24 DIAGNOSIS — N939 Abnormal uterine and vaginal bleeding, unspecified: Secondary | ICD-10-CM

## 2023-07-24 DIAGNOSIS — N946 Dysmenorrhea, unspecified: Secondary | ICD-10-CM

## 2023-07-24 DIAGNOSIS — R110 Nausea: Secondary | ICD-10-CM

## 2023-07-24 DIAGNOSIS — Z09 Encounter for follow-up examination after completed treatment for conditions other than malignant neoplasm: Secondary | ICD-10-CM

## 2023-07-24 MED ORDER — ONDANSETRON 4 MG PO TBDP
4.0000 mg | ORAL_TABLET | Freq: Four times a day (QID) | ORAL | 0 refills | Status: AC | PRN
  Filled 2023-07-24: qty 20, 5d supply, fill #0

## 2023-07-24 NOTE — Progress Notes (Signed)
   POSTOPERATIVE VISIT NOTE   Subjective:     Karrisa Didio is a 38 y.o. G0P0 who presents to the clinic 3 weeks status post TLH, BS, cysto for abnormal uterine bleeding and dysmenorrhea. Eating a regular diet with difficulty - having persistent nausea without vomitting. Bowel movements are abnormal with constipation which is different from her baseline of loose stools due to IBS-D. Has not been taking any miralax  due to fear of what may be triggered given her PMH of IBS. Pain is controlled with current analgesics. Medications being used: acetaminophen , ibuprofen  (OTC), and narcotic analgesics including oxycodone . Incision: having a stinging, rash sensation around umbilicus. Notes having something similar when she had her chole as a child.  Vaginal bleeding: none Resumed sexual acitivity:   The following portions of the patient's history were reviewed and updated as appropriate: allergies, current medications, past family history, past medical history, past social history, past surgical history, and problem list..   Review of Systems Pertinent items are noted in HPI.    Objective:    BP 136/84   Pulse 84   Wt 296 lb (134.3 kg)   BMI 47.06 kg/m  General:  alert, cooperative, and no distress  Abdomen: soft, mild tenderness  Incision:   healing well, no drainage, no hernia, no seroma, no swelling, well approximated, erythema in inferior portion of umbilical incision with mild erythema on opposing sides of skin on anterior an din, no dehiscence, incision well approximated  Pelvic:   Exam deferred.    Pathology Results: FINAL MICROSCOPIC DIAGNOSIS:   A. UTERUS WITH CERVIX AND BILATERAL FALLOPIAN TUBES, HYSTERECTOMY:  Cervix     Nabothian cyst      Negative for dysplasia  Endometrium     Proliferative with breakdown and hemorrhage      Negative for endometrial intraepithelial neoplasia (EIN) and  malignancy  Myometrium     Adenomyosis      Negative for malignancy   Bilateral fallopian tubes      Unremarkable      Negative for endometriosis and malignancy    Assessment:   Doing well postoperatively. Operative findings again reviewed. Pathology report discussed.  Intraoperative photos reviewed  Plan:    1. Postop check (Primary) Doing well Discussed use of miralax  for postop constipation  Noted that bowels can change postop and some have persistent tiredness and nausea postop Zofran  for intermittent nausea  2. Abnormal uterine bleeding (AUB) Noted adenomyosis on surgical pathology, otherwise benign Resolved s/p hysterectomy   3. Dysmenorrhea Resolved s/p hysterectomy, anticipate pain should be resolved given finding of adenomyosis on pathology Reviewed may still have PMS/ovulatory symptoms   Activity restrictions: pelvic rest for 8-12 weeks Follow up: 8 week postop  Kiki Pelton, MD Obstetrician & Gynecologist, [facility] for [employer], [facility]

## 2023-07-28 ENCOUNTER — Other Ambulatory Visit: Payer: Self-pay

## 2023-07-28 ENCOUNTER — Ambulatory Visit: Payer: Self-pay | Attending: Internal Medicine | Admitting: Internal Medicine

## 2023-07-28 ENCOUNTER — Encounter: Payer: Self-pay | Admitting: Internal Medicine

## 2023-07-28 VITALS — BP 121/76 | HR 68 | Temp 97.9°F | Ht 66.0 in | Wt 298.0 lb

## 2023-07-28 DIAGNOSIS — F330 Major depressive disorder, recurrent, mild: Secondary | ICD-10-CM

## 2023-07-28 DIAGNOSIS — Z0000 Encounter for general adult medical examination without abnormal findings: Secondary | ICD-10-CM

## 2023-07-28 DIAGNOSIS — I10 Essential (primary) hypertension: Secondary | ICD-10-CM

## 2023-07-28 DIAGNOSIS — Z7189 Other specified counseling: Secondary | ICD-10-CM

## 2023-07-28 DIAGNOSIS — F322 Major depressive disorder, single episode, severe without psychotic features: Secondary | ICD-10-CM | POA: Insufficient documentation

## 2023-07-28 MED ORDER — METOPROLOL TARTRATE 25 MG PO TABS
25.0000 mg | ORAL_TABLET | Freq: Two times a day (BID) | ORAL | 1 refills | Status: DC
  Filled 2023-07-28: qty 180, 90d supply, fill #0

## 2023-07-28 NOTE — Progress Notes (Addendum)
 Subjective:    Kristen [name] is a 38 y.o. female who presents for a Welcome to Medicare exam.  Her significant other, Lemond, is with her. Pt with hx ofanx/dep, IBS,  PCOS, morbid obesity, Dep, RLS, social phobia, lymphocytic hypophysitis, hyperprolactinemia, severe persistent asthma, migraines, OSA (thought study done 03/2023 by neurology revealed no OSA) CPAP, RLS, Elhers Danlo.    Cardiac Risk Factors include: obesity (BMI >30kg/m2);sedentary lifestyle      Objective:    Today's Vitals   07/28/23 1618  BP: 121/76  Pulse: 68  Temp: 97.9 F (36.6 C)  TempSrc: Oral  SpO2: 98%  Weight: 298 lb (135.2 kg)  Height: 5' 6 (1.676 m)  PainSc: 4   Body mass index is 48.1 kg/m. General: Middle-age morbidly obese Caucasian female in NAD CVS: Regular rate and Kristen no gallops or murmurs heard Chest: Clear to auscultation bilaterally. Medications Outpatient Encounter Medications as of 07/28/2023  Medication Sig   acetaminophen  (TYLENOL ) 500 MG tablet Take 1 tablet (500 mg total) by mouth every 6 (six) hours as needed.   albuterol  (VENTOLIN  HFA) 108 (90 Base) MCG/ACT inhaler Inhale 2 puffs into the lungs every 6 (six) hours as needed for wheezing or shortness of breath.   Benzocaine -Resorcinol (VAGISIL EX) Apply 1 application. topically as needed.   busPIRone  (BUSPAR ) 10 MG tablet Take 1 tablet (10 mg total) by mouth 3 (three) times daily.   Calcium Carb-Cholecalciferol (CALCIUM 500 + D PO) Take 1 tablet by mouth daily.   colestipol  (COLESTID ) 1 g tablet Take 2 tablets (2 g total) by mouth 3 (three) times daily.   diclofenac  Sodium (VOLTAREN ) 1 % GEL Apply 2 g topically 4 (four) times daily.   gabapentin  (NEURONTIN ) 300 MG capsule Take 1 capsule (300 mg total) by mouth at bedtime.   hydrOXYzine  (ATARAX ) 25 MG tablet Take 1 tablet (25 mg total) by mouth 4 (four) times daily.   ibuprofen  (ADVIL ) 800 MG tablet Take 1 tablet (800 mg total) by mouth 3 (three) times daily with meals as  needed for headache, moderate pain (pain score 4-6) or cramping.   ipratropium-albuterol  (DUONEB) 0.5-2.5 (3) MG/3ML SOLN Take 3 mLs by nebulization every 6 (six) hours as needed.   loperamide (IMODIUM A-D) 2 MG tablet Take 2 mg by mouth 4 (four) times daily as needed for diarrhea or loose stools.   nystatin  (MYCOSTATIN /NYSTOP ) powder Apply 1 Application topically 2 (two) times daily.   omeprazole  (PRILOSEC) 40 MG capsule Take 1 capsule (40 mg total) by mouth in the morning and at bedtime.   ondansetron  (ZOFRAN -ODT) 4 MG disintegrating tablet Take 1 tablet (4 mg total) by mouth every 6 (six) hours as needed for nausea.   oxyCODONE  (OXY IR/ROXICODONE ) 5 MG immediate release tablet Take 1 tablet (5 mg total) by mouth every 4 (four) hours as needed for severe pain (pain score 7-10) or breakthrough pain.   propranolol  (INDERAL ) 10 MG tablet Take 2 tablets (20 mg total) by mouth 3 (three) times daily as needed (palpitations).   QUEtiapine  (SEROQUEL ) 100 MG tablet Take 1 tablet (100 mg total) by mouth at bedtime.   sertraline  (ZOLOFT ) 100 MG tablet Take 2 tablets (200 mg total) by mouth at bedtime.   SUMAtriptan  (IMITREX ) 50 MG tablet Take 1 tablet at start of headache. May repeat in 2 hours if headache persists or recurs. Max 2 tablets in 24 hours.   traZODone  (DESYREL ) 100 MG tablet Take 1 tablet (100 mg total) by mouth at bedtime as needed  for sleep.   [DISCONTINUED] metoprolol  tartrate (LOPRESSOR ) 25 MG tablet Take 1 tablet (25 mg total) by mouth 2 (two) times daily.   metoprolol  tartrate (LOPRESSOR ) 25 MG tablet Take 1 tablet (25 mg total) by mouth 2 (two) times daily. (Patient not taking: Reported on 07/28/2023)   metoprolol  tartrate (LOPRESSOR ) 25 MG tablet Take 1 tablet (25 mg total) by mouth 2 (two) times daily.   No facility-administered encounter medications on file as of 07/28/2023.     History: Past Medical History:  Diagnosis Date   Anxiety 2013   treated at [facility] by Darice Molt     Asthma 1989   no hospitalization, or intubation, previously on advair and singulair . asthma worsening.  Follows w/ Dr. Barnie Louder @ [facility]. Per pt on 01/17/23, she only uses inhaler if she is doing strenous exercise or during the change of seasons.   Chronic diarrhea    Follows w/ [facility].   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   Dysfunctional uterine bleeding    Dysrhythmia 2010   PVC's, palpitations   Ehlers-Danlos syndrome    Follows with Dr. Lauraine Mulch, Sports Medicine and PCP.   Essential hypertension 03/12/2019   Follows w/ [facility] cardiology.   Family history of adverse reaction to anesthesia    Mother woke up during deviated septal surgery   GERD (gastroesophageal reflux disease)    History of hiatal hernia    History of suicidal ideation    IBS (irritable bowel syndrome)    Insulin  resistance    Follows w/ endocrinology.   Kyphosis of thoracic region 2004   painful, runs in dad side    Lymphocytic hypophysitis (HCC)    Follows w/ endocrinology.   Migraine    Follows w/ PCP.   Orthostatic hypotension    Follows w/ [facility], Rosaline Bane, NP.   Pancreatitis due to biliary obstruction 2003   PCOS (polycystic ovarian syndrome) 2014   facial hair, irregular periods, never been pregnant    PTSD (post-traumatic stress disorder)    Follows w/ Behavioral Health.   Sleep apnea    not currently wearing Cpap   Social phobia    Follows w/ Behavioral Health.   Past Surgical History:  Procedure Laterality Date   BIOPSY  06/11/2021   Procedure: BIOPSY;  Surgeon: Shila Gustav GAILS, MD;  Location: [facility];  Service: Gastroenterology;;   BIOPSY  02/25/2023   Procedure: BIOPSY;  Surgeon: Shila Gustav GAILS, MD;  Location: [facility];  Service: Gastroenterology;;   BLADDER SURGERY     bladder stretched as a child   CHOLECYSTECTOMY  2003    COLONOSCOPY WITH PROPOFOL  N/A 06/11/2021   Procedure: COLONOSCOPY WITH PROPOFOL ;  Surgeon: Shila Gustav GAILS, MD;  Location: [facility];  Service: Gastroenterology;  Laterality: N/A;   CYSTOSCOPY N/A 07/01/2023   Procedure: CYSTOSCOPY;  Surgeon: Jeralyn Crutch, MD;  Location: [facility];  Service: Gynecology;  Laterality: N/A;   DILATATION & CURETTAGE/HYSTEROSCOPY WITH MYOSURE N/A 12/14/2019   Procedure: DILATATION & CURETTAGE/HYSTEROSCOPY WITH Polypectomy with MYOSURE;  Surgeon: Herchel Gloris LABOR, MD;  Location: [facility];  Service: Gynecology;  Laterality: N/A;   ESOPHAGOGASTRODUODENOSCOPY (EGD) WITH PROPOFOL  N/A 02/25/2023   Procedure: ESOPHAGOGASTRODUODENOSCOPY (EGD) WITH PROPOFOL ;  Surgeon: Shila Gustav GAILS, MD;  Location: [facility];  Service: Gastroenterology;  Laterality: N/A;   INTRAUTERINE DEVICE (IUD) INSERTION N/A 01/21/2023  Procedure: INTRAUTERINE DEVICE (IUD) INSERTION(MIRENA );  Surgeon: Eveline Lynwood MATSU, MD;  [facility];  Service: Gynecology;  Laterality: N/A;   MOHS SURGERY Left 11/2022   Right side and left leg   OPEN REDUCTION INTERNAL FIXATION (ORIF) PROXIMAL PHALANX Right 07/23/2013   Procedure: OPEN TREATMENT RIGHT RING FINGER, PROXIMAL INTERPHALANGEAL/JOINT FRACTURE DISLOCATION;  Surgeon: Alm DELENA Hummer, MD;  Location: [facility];  Service: Orthopedics;  Laterality: Right;   TONSILLECTOMY     and adenoidectomy   TOTAL LAPAROSCOPIC HYSTERECTOMY WITH SALPINGECTOMY Bilateral 07/01/2023   Procedure: HYSTERECTOMY, TOTAL, LAPAROSCOPIC, WITH SALPINGECTOMY;  Surgeon: Jeralyn Crutch, MD;  Location: [facility];  Service: Gynecology;  Laterality: Bilateral;    Family History  Problem Relation Age of Onset   Colon cancer Mother    Hypertension Mother    Arnold-Chiari malformation Mother    Restless legs syndrome Mother    Osteoporosis Mother    COPD Mother    Asthma Mother    Squamous cell carcinoma Mother        skin    Eczema  Mother    Arthritis Mother    Peripheral Artery Disease Mother    Anxiety disorder Mother    OCD Mother    Colonic polyp Mother    Stroke Mother    Hypertension Father    Scoliosis Father    Bipolar disorder Father    Congestive Heart Failure Father    COPD Father    Depression Father    Alcohol abuse Maternal Aunt    Depression Maternal Aunt    Pancreatic cancer Maternal Aunt    Alcohol abuse Paternal Aunt    Depression Paternal Aunt    Diabetes Maternal Grandmother    Hypertension Maternal Grandmother    Dementia Maternal Grandmother    OCD Maternal Grandmother    Alzheimer's disease Maternal Grandmother    Bone cancer Maternal Grandfather 92       bone marrow    Multiple myeloma Maternal Grandfather    Alcohol abuse Maternal Grandfather    Drug abuse Maternal Grandfather    Colon polyps Paternal Grandmother    Colon cancer Paternal Grandmother    Cancer Paternal Grandmother        colon   Diabetes Paternal Grandfather    Alcohol abuse Paternal Grandfather    Drug abuse Paternal Grandfather    Dementia Paternal Grandfather    ADD / ADHD Cousin    Esophageal cancer Neg Hx    Rectal cancer Neg Hx    Stomach cancer Neg Hx    Social History   Occupational History   Occupation: Unemployed   Tobacco Use   Smoking status: Never    Passive exposure: Past   Smokeless tobacco: Never  Vaping Use   Vaping status: Never Used  Substance and Sexual Activity   Alcohol use: No   Drug use: No   Sexual activity: Yes    Birth control/protection: I.U.D.    Tobacco Counseling Pt is never smoker No street drugs  Immunizations and Health Maintenance Immunization History  Administered Date(s) Administered   Influenza Whole 02/02/2007   Influenza, Seasonal, Injecte, Preservative Fre 12/30/2022   Influenza,inj,Quad PF,6+ Mos 10/10/2016, 11/05/2019   PFIZER(Purple Top)SARS-COV-2 Vaccination 06/23/2019, 07/16/2019   PNEUMOCOCCAL CONJUGATE-20 05/01/2023   Tdap 12/30/2022    Health Maintenance Due  Topic Date Due   Hepatitis C Screening  Never done   HPV VACCINES (1 - Risk 3-dose SCDM series) Never done   COVID-19 Vaccine (3 -  Pfizer risk series) 08/13/2019  Had 2/3 HPV Gardicella last 1 at age 63. Had hystrectomy recently.  Activities of Daily Living    07/28/2023    4:31 PM 07/02/2023    9:03 AM  In your present state of health, do you have any difficulty performing the following activities:  Hearing? 0 0  Vision? 0 0  Difficulty concentrating or making decisions? 1 0  Comment Some times with stress   Walking or climbing stairs? 1   Comment Complicated with EDS, asthma   Dressing or bathing? 0   Doing errands, shopping? 0 0  Preparing Food and eating ? N   Using the Toilet? N   In the past six months, have you accidently leaked urine? Y   Comment seems better after hysterectomy   Do you have problems with loss of bowel control? N   Managing your Medications? N   Managing your Finances? N   Housekeeping or managing your Housekeeping? N     Physical Exam   Physical Exam (optional), or other factors deemed appropriate based on the beneficiary's medical and social history and current clinical standards.   Advanced Directives: Does Patient Have a Medical Advance Directive?: No Would patient like information on creating a medical advance directive?: Yes (MAU/Ambulatory/Procedural Areas - Information given)  EKG:  Declines; has EKGs in chart      Assessment:    This is a routine wellness examination for this patient .   Vision/Hearing screen Vision Screening   Right eye Left eye Both eyes  Without correction 20/13 20/15 20/15   With correction     Whisper test is normal.   Goals      Weight (lb) < 200 lb (90.7 kg)     Want to get down to 230 lbs.      She was doing meal planning but not recently since having hysterectomy.  States that it was much more difficult to stand and cook while healing from her hysterectomy.  She is also  not been to the gym since her hysterectomy but plans to resume once fully healed.  She was going 2-3 days a week  Depression Screen    07/28/2023    4:41 PM 07/28/2023    4:20 PM 06/18/2023   11:53 AM 06/13/2023    8:57 AM  PHQ 2/9 Scores  PHQ - 2 Score 4 4 2    PHQ- 9 Score 16 16 10       Information is confidential and restricted. Go to Review Flowsheets to unlock data.   On Seroquel  and Zoloft  Sees [facility]. Reports she is doing well on her medications  Fall Risk    07/28/2023    4:20 PM  Fall Risk   Falls in the past year? 0  Number falls in past yr: 0  Injury with Fall? 0  Risk for fall due to : No Fall Risks  Follow up Falls evaluation completed    Cognitive Function:    07/28/2023    4:34 PM  MMSE - Mini Mental State Exam  Orientation to time 5  Orientation to Place 5  Registration 3  Attention/ Calculation 5  Recall 3  Language- name 2 objects 2  Language- repeat 1  Language- follow 3 step command 3  Language- read & follow direction 1  Write a sentence 1  Copy design 1  Total score 30      07/22/2017    1:08 PM  Montreal Cognitive Assessment   Visuospatial/ Executive (0/5) 3  Naming (0/3) 3  Attention: Read list of digits (0/2) 1  Attention: Read list of letters (0/1) 1  Attention: Serial 7 subtraction starting at 100 (0/3) 3  Language: Repeat phrase (0/2) 2  Language : Fluency (0/1) 1  Abstraction (0/2) 2  Delayed Recall (0/5) 3  Orientation (0/6) 6  Total 25      Patient Care Team: Vicci Barnie NOVAK, MD as PCP - General (Internal Medicine)     Plan:    1. Encounter for Medicare annual wellness exam (Primary)  2. Advance directive discussed with patient Patient given packet on advanced directive.  I explained to her what is an advanced directive, living will and healthcare power of attorney.  She plans to review the packet.  Advised to give us  a copy if she decides to execute a living will or healthcare power of attorney.  3. Essential  hypertension Patient requested refill on metoprolol . - metoprolol  tartrate (LOPRESSOR ) 25 MG tablet; Take 1 tablet (25 mg total) by mouth 2 (two) times daily.  Dispense: 180 tablet; Refill: 1  4. Morbid obesity (HCC) Encouraged healthy eating habits and to eat healthy even when she has to eat out.  Printed information given on healthy eating habits she plans to resume exercise once she is fully healed from her hysterectomy.  5. Major depressive disorder, recurrent episode, mild (HCC) Reports doing well on current medications from her behavioral health provider.  I have personally reviewed and noted the following in the patient's chart:   Medical and social history Use of alcohol, tobacco or illicit drugs  Current medications and supplements including opioid prescriptions. Patient is currently taking opioid prescriptions. Information provided to patient regarding non-opioid alternatives. Patient advised to discuss non-opioid treatment plan with their provider.  Still has some oxycodone  given post recent hysterectomy.  She states that she uses it only occasionally if pain is severe otherwise she uses a nonsteroidal. Functional ability and status Nutritional status Physical activity Advanced directives List of other physicians Hospitalizations, surgeries, and ER visits in previous 12 months Vitals Screenings to include cognitive, depression, and falls Referrals and appointments  In addition, I have reviewed and discussed with patient certain preventive protocols, quality metrics, and best practice recommendations. A written personalized care plan for preventive services as well as general preventive health recommendations were provided to patient.     Barnie Vicci, MD 07/28/2023

## 2023-07-28 NOTE — Patient Instructions (Signed)
 Please bring us  a copy of your living will or health care power of attorney if you execute one.  Healthy Eating, Adult Healthy eating may help you get and keep a healthy body weight, reduce the risk of chronic disease, and live a long and productive life. It is important to follow a healthy eating pattern. Your nutritional and calorie needs should be met mainly by different nutrient-rich foods. What are tips for following this plan? Reading food labels Read labels and choose the following: Reduced or low sodium products. Juices with 100% fruit juice. Foods with low saturated fats (<3 g per serving) and high polyunsaturated and monounsaturated fats. Foods with whole grains, such as whole wheat, cracked wheat, brown rice, and wild rice. Whole grains that are fortified with folic acid. This is recommended for females who are pregnant or who want to become pregnant. Read labels and do not eat or drink the following: Foods or drinks with added sugars. These include foods that contain brown sugar, corn sweetener, corn syrup, dextrose , fructose, glucose, high-fructose corn syrup, honey, invert sugar, lactose, malt syrup, maltose, molasses, raw sugar, sucrose, trehalose, or turbinado sugar. Limit your intake of added sugars to less than 10% of your total daily calories. Do not eat more than the following amounts of added sugar per day: 6 teaspoons (25 g) for females. 9 teaspoons (38 g) for males. Foods that contain processed or refined starches and grains. Refined grain products, such as white flour, degermed cornmeal, white bread, and white rice. Shopping Choose nutrient-rich snacks, such as vegetables, whole fruits, and nuts. Avoid high-calorie and high-sugar snacks, such as potato chips, fruit snacks, and candy. Use oil-based dressings and spreads on foods instead of solid fats such as butter, margarine, sour cream, or cream cheese. Limit pre-made sauces, mixes, and instant products such as  flavored rice, instant noodles, and ready-made pasta. Try more plant-protein sources, such as tofu, tempeh, black beans, edamame, lentils, nuts, and seeds. Explore eating plans such as the Mediterranean diet or vegetarian diet. Try heart-healthy dips made with beans and healthy fats like hummus and guacamole. Vegetables go great with these. Cooking Use oil to saut or stir-fry foods instead of solid fats such as butter, margarine, or lard. Try baking, boiling, grilling, or broiling instead of frying. Remove the fatty part of meats before cooking. Steam vegetables in water  or broth. Meal planning  At meals, imagine dividing your plate into fourths: One-half of your plate is fruits and vegetables. One-fourth of your plate is whole grains. One-fourth of your plate is protein, especially lean meats, poultry, eggs, tofu, beans, or nuts. Include low-fat dairy as part of your daily diet. Lifestyle Choose healthy options in all settings, including home, work, school, restaurants, or stores. Prepare your food safely: Wash your hands after handling raw meats. Where you prepare food, keep surfaces clean by regularly washing with hot, soapy water . Keep raw meats separate from ready-to-eat foods, such as fruits and vegetables. Cook seafood, meat, poultry, and eggs to the recommended temperature. Get a food thermometer. Store foods at safe temperatures. In general: Keep cold foods at 55F (4.4C) or below. Keep hot foods at 155F (60C) or above. Keep your freezer at [facility] (-17.8C) or below. Foods are not safe to eat if they have been between the temperatures of 40-155F (4.4-60C) for more than 2 hours. What foods should I eat? Fruits Aim to eat 1-2 cups of fresh, canned (in natural juice), or frozen fruits each day. One cup of fruit equals 1  small apple, 1 large banana, 8 large strawberries, 1 cup (237 g) canned fruit,  cup (82 g) dried fruit, or 1 cup (240 mL) 100% juice. Vegetables Aim to eat  2-4 cups of fresh and frozen vegetables each day, including different varieties and colors. One cup of vegetables equals 1 cup (91 g) broccoli or cauliflower florets, 2 medium carrots, 2 cups (150 g) raw, leafy greens, 1 large tomato, 1 large bell pepper, 1 large sweet potato, or 1 medium white potato. Grains Aim to eat 5-10 ounce-equivalents of whole grains each day. Examples of 1 ounce-equivalent of grains include 1 slice of bread, 1 cup (40 g) ready-to-eat cereal, 3 cups (24 g) popcorn, or  cup (93 g) cooked rice. Meats and other proteins Try to eat 5-7 ounce-equivalents of protein each day. Examples of 1 ounce-equivalent of protein include 1 egg,  oz nuts (12 almonds, 24 pistachios, or 7 walnut halves), 1/4 cup (90 g) cooked beans, 6 tablespoons (90 g) hummus or 1 tablespoon (16 g) peanut butter. A cut of meat or fish that is the size of a deck of cards is about 3-4 ounce-equivalents (85 g). Of the protein you eat each week, try to have at least 8 sounce (227 g) of seafood. This is about 2 servings per week. This includes salmon, trout, herring, sardines, and anchovies. Dairy Aim to eat 3 cup-equivalents of fat-free or low-fat dairy each day. Examples of 1 cup-equivalent of dairy include 1 cup (240 mL) milk, 8 ounces (250 g) yogurt, 1 ounces (44 g) natural cheese, or 1 cup (240 mL) fortified soy milk. Fats and oils Aim for about 5 teaspoons (21 g) of fats and oils per day. Choose monounsaturated fats, such as canola and olive oils, mayonnaise made with olive oil or avocado oil, avocados, peanut butter, and most nuts, or polyunsaturated fats, such as sunflower, corn, and soybean oils, walnuts, pine nuts, sesame seeds, sunflower seeds, and flaxseed. Beverages Aim for 6 eight-ounce glasses of water  per day. Limit coffee to 3-5 eight-ounce cups per day. Limit caffeinated beverages that have added calories, such as soda and energy drinks. If you drink alcohol: Limit how much you have to: 0-1 drink a  day if you are female. 0-2 drinks a day if you are female. Know how much alcohol is in your drink. In the [place] one drink is one 12 oz bottle of beer (355 mL), one 5 oz glass of wine (148 mL), or one 1 oz glass of hard liquor (44 mL). Seasoning and other foods Try not to add too much salt to your food. Try using herbs and spices instead of salt. Try not to add sugar to food. This information is based on [place] nutrition guidelines. To learn more, visit DisposableNylon.be. Exact amounts may vary. You may need different amounts. This information is not intended to replace advice given to you by your health care provider. Make sure you discuss any questions you have with your health care provider. Document Revised: 10/22/2021 Document Reviewed: 10/22/2021 Elsevier Patient Education  2024 [employer].

## 2023-07-29 ENCOUNTER — Other Ambulatory Visit: Payer: Self-pay

## 2023-07-29 ENCOUNTER — Institutional Professional Consult (permissible substitution) (INDEPENDENT_AMBULATORY_CARE_PROVIDER_SITE_OTHER): Payer: Self-pay | Admitting: Internal Medicine

## 2023-08-04 ENCOUNTER — Other Ambulatory Visit: Payer: Self-pay

## 2023-08-11 ENCOUNTER — Other Ambulatory Visit: Payer: Self-pay

## 2023-08-15 ENCOUNTER — Other Ambulatory Visit: Payer: Self-pay | Admitting: Internal Medicine

## 2023-08-15 ENCOUNTER — Other Ambulatory Visit: Payer: Self-pay

## 2023-08-15 DIAGNOSIS — I10 Essential (primary) hypertension: Secondary | ICD-10-CM

## 2023-08-22 ENCOUNTER — Telehealth: Payer: Self-pay | Admitting: Internal Medicine

## 2023-08-22 NOTE — Telephone Encounter (Signed)
 Copied from CRM [id number]. Topic: General - Other >> Aug 22, 2023  4:28 PM Elle L wrote:  Reason for CRM: [place] with [employer] called on behalf of the patient to verify the patient's diagnosis diabetes, heart failure, and cardiovascular disease. Their call back number [phone] reference number [id number].

## 2023-08-25 ENCOUNTER — Encounter: Payer: Self-pay | Admitting: Obstetrics and Gynecology

## 2023-08-27 ENCOUNTER — Ambulatory Visit: Admitting: Student

## 2023-08-27 NOTE — Telephone Encounter (Signed)
 Called & spoke to [place] who confirmed that diagnosis have previously been confirmed. No further assistance needed at this time.

## 2023-08-28 ENCOUNTER — Ambulatory Visit (HOSPITAL_COMMUNITY): Admitting: Clinical

## 2023-08-28 DIAGNOSIS — F331 Major depressive disorder, recurrent, moderate: Secondary | ICD-10-CM

## 2023-08-28 NOTE — Progress Notes (Unsigned)
 THERAPIST PROGRESS NOTE Virtual Visit via Video Note  I connected with Kristen [name] on 08/28/23 at  8:00 AM EDT by a video enabled telemedicine application and verified that I am speaking with the correct person using two identifiers.  Location: Patient: home Provider: office   I discussed the limitations of evaluation and management by telemedicine and the availability of in person appointments. The patient expressed understanding and agreed to proceed.   Follow Up Instructions: I discussed the assessment and treatment plan with the patient. The patient was provided an opportunity to ask questions and all were answered. The patient agreed with the plan and demonstrated an understanding of the instructions.   The patient was advised to call back or seek an in-person evaluation if the symptoms worsen or if the condition fails to improve as anticipated.   Session Time: 30 minutes  Participation Level: Active  Behavioral Response: CasualAlertDepressed  Type of Therapy: Individual Therapy  Treatment Goals addressed: client will practice behavioral activation skills at least 3 days per week  ProgressTowards Goals: Progressing  Interventions: CBT and Supportive  Summary:  Kristen [name] is a 38 y.o. female who presents for the scheduled appointment oriented times five, appropriately dressed and friendly. Client denied hallucinations, delusions, suicidal and homicidal ideations. Client reported on today she is doing fairly okay. Client reported she is coming on with summer cold. Client reported otherwise she has the same stressor with her parents. Client reported her mother has shown some improvement with her health so insurance decided to discontinue the nurse who was coming out to help with her mom. Client reported she wishes they would keep it because she still has bad days of not being able to mobilize to get housework done. Client reported she will go talk to her  mothers doctor with her at her next appointment. Client reported her dad has been put on a weight loss medication and has lost some weight. Client reported he was almost 500lbs. Client reported he even said he could feel his feet the other day even though it was pain. Client reported he usually can't because of neuropathy. Client reported she herself is recovering from her hysterectomy well. Client reported she is waiting to be cleared by her doctor so she can go back to working out. Client reported she believes the procedure has helped with her hormones as she does not feel as depressed. Client reported she is looking forward to coordinating time to go see her friends. Evidence of progress towards goal:  client PHQ9 is below 10. Flowsheet Row Counselor from 08/28/2023 in [facility]  PHQ-9 Total Score 5     Suicidal/Homicidal: Nowithout intent/plan  Therapist Response:  Therapist began the appointment asking the client how she has been doing. Therapist engage with active listening and positive emotional support. Therapist used cbt to engage and ask about changes with her health, parents and severity of depressive symptoms. Therapist used cbt to engage and identify her strengths of coping with stress. Therapist used cbt to continue teaching the client about taking her time to plan her own activity outside of the house to help decrease her isolation when she is medically cleared. Therapist used CBT ask the client to identify her progress with frequency of use with coping skills with continued practice in her daily activity.    Therapist assigned the client homework to practice self care.   Plan: Return again in 4 weeks.  Diagnosis: moderate episode of recurrent major depressive  disorder  Collaboration of Care: Patient refused AEB none requested by the client.  Patient/Guardian was advised Release of Information must be obtained prior to any record release in order  to collaborate their care with an outside provider. Patient/Guardian was advised if they have not already done so to contact the registration department to sign all necessary forms in order for us  to release information regarding their care.   Consent: Patient/Guardian gives verbal consent for treatment and assignment of benefits for services provided during this visit. Patient/Guardian expressed understanding and agreed to proceed.   Laquinta Hazell Y Riya Huxford, LCSW 08/28/2023

## 2023-09-03 ENCOUNTER — Ambulatory Visit (HOSPITAL_BASED_OUTPATIENT_CLINIC_OR_DEPARTMENT_OTHER): Attending: Internal Medicine | Admitting: Pulmonary Disease

## 2023-09-03 DIAGNOSIS — G4733 Obstructive sleep apnea (adult) (pediatric): Secondary | ICD-10-CM | POA: Diagnosis present

## 2023-09-04 ENCOUNTER — Ambulatory Visit: Admitting: Student

## 2023-09-04 NOTE — Procedures (Signed)
 [facility] [address] [place], [place] [address] Tel: [phone]   Fax: [phone]  Polysomnography Interpretation  Patient Name:  Kristen, [name] Date:  09/03/2023 Referring Physician:  Verdon Gore, MD %%startinterp%% Indications for Polysomnography The patient is a 38 year-old Female who is 5' 6 and weighs 300.0 lbs. Her BMI equals 48.8.  A full night polysomnogram was performed to evaluate for OSA.  Medication was taken at 2100.  Trazodone   Seroquel    Polysomnogram Data A full night polysomnogram recorded the standard physiologic parameters including EEG, EOG, EMG, EKG, nasal and oral airflow.  Respiratory parameters of chest and abdominal movements were recorded with Respiratory Inductance Plethysmography belts.  Oxygen saturation was recorded by pulse oximetry.   Sleep Architecture The total recording time of the polysomnogram was 444.6 minutes.  The total sleep time was 433.0 minutes.  The patient spent 0.7% of total sleep time in Stage N1, 51.5% in Stage N2, 40.9% in Stages N3, and 6.9% in REM.  Sleep latency was 5.1 minutes.  REM latency was 372.5 minutes.  Sleep Efficiency was 97.4%.  Wake after Sleep Onset time was 6.5 minutes.  Respiratory Events The polysomnogram revealed a presence of - obstructive, 1 central, and - mixed apneas resulting in an Apnea index of 0.1 events per hour.  There were 12 hypopneas (>=3% desaturation and/or arousal) resulting in an Apnea\Hypopnea Index (AHI >=3% desaturation and/or arousal) of 1.8 events per hour.  There were 5 hypopneas (>=4% desaturation) resulting in an Apnea\Hypopnea Index (AHI >=4% desaturation) of 0.8 events per hour.  There were 5 Respiratory Effort Related Arousals resulting in a RERA index of 0.7 events per hour. The Respiratory Disturbance Index is 2.5 events per hour.  The snore index was 9.7 events per hour.  Mean oxygen saturation was 95.1%.  The lowest oxygen saturation  during sleep was 91.0%.  Time spent <=88% oxygen saturation was - minutes (-).  Limb Activity There were 263 total limb movements recorded, of this total, 245 were classified as PLMs.  PLM index was 33.9 per hour and PLM associated with Arousals index was 3.6 per hour.  Cardiac Summary The average pulse rate was 57.3 bpm.  The minimum pulse rate was 44.0 bpm while the maximum pulse rate was 95.0 bpm.  Cardiac rhythm was normal/abnormal.  Comments: REM latency was high which may be the effect of medications PLM index was high but PLM related arousals were not significant. No supine sleep was noted   Diagnosis: No significant sleep disordered breathing Significance of PLMs without arousals is unclear,   Recommendations: Consider alternative causes of hypersomnolence Weight loss should be encouraged Please corelate clinically with history of RLS   This study was personally reviewed and electronically signed by: Harden Staff, MD Accredited Board Certified in Sleep Medicine

## 2023-09-05 ENCOUNTER — Encounter (HOSPITAL_COMMUNITY): Admitting: Physician Assistant

## 2023-09-06 ENCOUNTER — Ambulatory Visit: Payer: Self-pay | Admitting: Internal Medicine

## 2023-09-17 ENCOUNTER — Ambulatory Visit (INDEPENDENT_AMBULATORY_CARE_PROVIDER_SITE_OTHER): Admitting: Nurse Practitioner

## 2023-09-17 ENCOUNTER — Encounter (INDEPENDENT_AMBULATORY_CARE_PROVIDER_SITE_OTHER): Payer: Self-pay | Admitting: Nurse Practitioner

## 2023-09-17 VITALS — BP 137/83 | HR 77 | Temp 97.9°F | Ht 66.0 in | Wt 296.0 lb

## 2023-09-17 DIAGNOSIS — G90A Postural orthostatic tachycardia syndrome (POTS): Secondary | ICD-10-CM | POA: Diagnosis not present

## 2023-09-17 DIAGNOSIS — K580 Irritable bowel syndrome with diarrhea: Secondary | ICD-10-CM

## 2023-09-17 DIAGNOSIS — Z6841 Body Mass Index (BMI) 40.0 and over, adult: Secondary | ICD-10-CM

## 2023-09-17 DIAGNOSIS — E282 Polycystic ovarian syndrome: Secondary | ICD-10-CM

## 2023-09-17 DIAGNOSIS — E782 Mixed hyperlipidemia: Secondary | ICD-10-CM

## 2023-09-17 DIAGNOSIS — Q796 Ehlers-Danlos syndrome, unspecified: Secondary | ICD-10-CM

## 2023-09-17 DIAGNOSIS — I10 Essential (primary) hypertension: Secondary | ICD-10-CM

## 2023-09-17 DIAGNOSIS — F321 Major depressive disorder, single episode, moderate: Secondary | ICD-10-CM | POA: Diagnosis not present

## 2023-09-17 DIAGNOSIS — K219 Gastro-esophageal reflux disease without esophagitis: Secondary | ICD-10-CM | POA: Diagnosis not present

## 2023-09-17 DIAGNOSIS — J4520 Mild intermittent asthma, uncomplicated: Secondary | ICD-10-CM

## 2023-09-17 DIAGNOSIS — E230 Hypopituitarism: Secondary | ICD-10-CM

## 2023-09-17 DIAGNOSIS — E66813 Obesity, class 3: Secondary | ICD-10-CM

## 2023-09-17 NOTE — Progress Notes (Signed)
 [address] [place], [place], [place] [address] Office: [phone]  /  Fax: [phone]   Initial Consultation    Kristen [name] was seen in clinic today to evaluate for obesity. She is interested in losing weight to improve overall health and reduce the risk of weight related complications. She presents today to review program treatment options, initial physical assessment, and evaluation.    She did have sleep study 09/03/23 that had AHI of 1.8 events/hour. Lowest O2 saturation with sleep was 91% No  significant sleep disordered breathing. She does not have prescribed CPAP. Consider alternative causes of hypersomnolence - weight loss encouraged. She does have some periodic limb movement and patient does have restless leg syndrome.   She does have hypertension which is normally well controlled with Metoprolol  25 mg BID, she did not take her medication today.  Depression/anxiety symptoms are controlled with medications and therapy through behavioral health. Currently on zoloft  200 mg every day, Buspar  10 mg TID and seroquel  100 mg at bedtime. She also takes Trazodone  100 mg and gabapentin  300 mg at bedtime for sleep and restless leg syndrome.   She has a history of chronic migraines and does use Imitrex  50 mg as needed. Currently well controlled  She has a history of POTS and Ehler's Danlos syndrome with hyper flexibility. She will occasionally have issues with passing out if temperature is too hot or gets up too quickly. She will occasionally have knee joints, fingers, ankles, ribs, collar bone will displace.  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   She also deals with IBS and does have dumping syndrome- colestipol  has helped somewhat but will still have 5-10 bowel movements a day  Last lipid panel did show mixed hyperlipidemia- she is not currently on medication for this condition. She is currently trying to watch saturated fats.   She had  laparoscopic hysterectomy with salpingectomy 07/01/23 for dysfunctional uterine bleeding.  SABRA Anthropometrics and Bioimpedance Analysis   Body mass index is 47.78 kg/m. Body Fat Mass : 47.3 % Visceral Fat Mass Rating : 14   Obesity Related Diseases and Complications  Obesity Quality of Life and Psychosocial Complications: Depression and / or anxiety, Body image dissatisfaction, Reduced health-related quality of life, and Decrease physical activity and social participation  Cardiometabolic: Dyslipidemia or hypercholesterolemia, Hypertension, DOE, Fatigue, Unspecified edema, and  lymphocytic hypophysitis  Biomechanical: Asthma, GERD, Impaired mobility and gait dysfunction, and Increased fall risk associated with POTS. Ankle subluxations from  Ehler Danlos  Weight Related History  She was referred by: Specialist  When asked what they would like to accomplish? She states: Adopt a healthier eating pattern and lifestyle, Improve energy levels and physical activity, Improve existing medical conditions, Improve quality of life, Improve appearance, Improve self-confidence, and Lose 60 lbs   Weight history: She started to notice weight gain at 11 after she started her period  Highest weight: 330 1 year ago and portion control / exercise helped lower weight  Contributing factors: family history of obesity, disruption of circadian rhythm / sleep disordered breathing, consumption of processed foods, use of obesogenic medications: Beta-blockers, Psychotropic medications, Antiepileptics, and Anticholinergics, moderate to high levels of stress, reduced physical activity, chronic skipping of meals, mental health problems, menopause, multiple weight loss attempts in the past, need for convenient foods, and self - critic or all-or-none mindset  Prior weight loss attempts: Balanced Plate / Portion Control  Current or previous pharmacotherapy: None  Response to medication: Never tried  medications  Current  nutrition plan: High-protein and Other: increased fiber  Greatest challenge with dieting: dieting fatigue, low energy, difficulty maintaining reduced calorie state, and meal preparation and cooking.  Current level of physical activity: Strength training 20 minutes, twice and Aquatics 20 minutes, twice- has not been able to exercise recently due to hysterectomy.   Barriers to Exercise: orthopedic problems  Readiness and Motivation  On a scale from 0 to 10 How ready are you to make changes to your eating and physical activity to lose weight? 10 How important is it for you to lose weight right now ? 10 How confident are you that you can lose weight if you try? 10  Past Medical History   Past Medical History:  Diagnosis Date   Anxiety 2013   treated at [facility] by Kristen [name]    Asthma 1989   no hospitalization, or intubation, previously on advair  and singulair . asthma worsening.  Follows w/ Dr. Barnie [name] @ [facility]. Per pt on 01/17/23, she only uses inhaler if she is doing strenous exercise or during the change of seasons.   Chronic diarrhea    Follows w/ [facility].   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   Dysfunctional uterine bleeding    Dysrhythmia 2010   PVC's, palpitations   Ehlers-Danlos syndrome    Follows with Dr. Lauraine [name], Sports Medicine and PCP.   Essential hypertension 03/12/2019   Follows w/ [facility] cardiology.   Family history of adverse reaction to anesthesia    Mother woke up during deviated septal surgery   GERD (gastroesophageal reflux disease)    History of hiatal hernia    History of suicidal ideation    IBS (irritable bowel syndrome)    Insulin  resistance    Follows w/ endocrinology.   Kyphosis of thoracic region 2004   painful, runs in dad side    Lymphocytic hypophysitis (HCC)    Follows w/ endocrinology.   Migraine     Follows w/ PCP.   Orthostatic hypotension    Follows w/ [facility], Kristen Bane, Kristen [name].   Pancreatitis due to biliary obstruction 2003   PCOS (polycystic ovarian syndrome) 2014   facial hair, irregular periods, never been pregnant    PTSD (post-traumatic stress disorder)    Follows w/ Behavioral Health.   Sleep apnea    not currently wearing Cpap   Social phobia    Follows w/ Behavioral Health.     Objective    BP 137/83   Pulse 77   Temp 97.9 F (36.6 C)   Ht 5' 6 (1.676 m)   Wt 296 lb (134.3 kg)   SpO2 99%   BMI 47.78 kg/m  She was weighed on the bioimpedance scale: Body mass index is 47.78 kg/m.    General:  Alert, oriented and cooperative. Patient is in no acute distress.  Respiratory: Normal respiratory effort, no problems with respiration noted   Gait: able to ambulate independently  Mental Status: Normal mood and affect. Normal behavior. Normal judgment and thought content.   Diagnostic Data Reviewed  BMET    Component Value Date/Time   NA 139 06/24/2023 1425   NA 141 07/14/2020 0937   K 3.9 06/24/2023 1425   CL 107 06/24/2023 1425   CO2 25 06/24/2023 1425   GLUCOSE 110 (H) 06/24/2023 1425   BUN 12 06/24/2023 1425   BUN 12  07/14/2020 0937   CREATININE 1.02 (H) 06/24/2023 1425   CREATININE 0.95 06/16/2023 0953   CALCIUM 9.2 06/24/2023 1425   GFRNONAA >60 06/24/2023 1425   GFRNONAA 70 03/25/2014 1730   GFRAA >60 04/15/2019 0624   GFRAA 80 03/25/2014 1730   Lab Results  Component Value Date   HGBA1C 5.2 06/18/2022   HGBA1C 5.10 03/25/2014   No results found for: INSULIN  CBC    Component Value Date/Time   WBC 8.5 06/24/2023 1425   RBC 4.92 06/24/2023 1425   HGB 12.3 06/24/2023 1425   HGB 13.3 06/27/2022 1434   HCT 39.9 06/24/2023 1425   HCT 39.9 06/27/2022 1434   PLT 247 06/24/2023 1425   PLT 278 06/27/2022 1434   MCV 81.1 06/24/2023 1425   MCV 85 06/27/2022 1434   MCH 25.0 (L) 06/24/2023 1425   MCHC 30.8 06/24/2023  1425   RDW 15.0 06/24/2023 1425   RDW 13.5 06/27/2022 1434   Iron/TIBC/Ferritin/ %Sat    Component Value Date/Time   IRON 53 06/27/2022 1434   TIBC 339 06/27/2022 1434   FERRITIN 40 06/27/2022 1434   IRONPCTSAT 16 06/27/2022 1434   Lipid Panel     Component Value Date/Time   CHOL 206 (H) 06/16/2023 0953   TRIG 169 (H) 06/16/2023 0953   HDL 35 (L) 06/16/2023 0953   CHOLHDL 5.9 (H) 06/16/2023 0953   VLDL 25.6 06/18/2022 1025   LDLCALC 140 (H) 06/16/2023 0953   Hepatic Function Panel     Component Value Date/Time   PROT 6.6 02/09/2023 1857   PROT 6.4 07/22/2017 1339   ALBUMIN 3.5 02/09/2023 1857   ALBUMIN 4.2 07/22/2017 1339   AST 26 02/09/2023 1857   ALT 29 02/09/2023 1857   ALKPHOS 116 02/09/2023 1857   BILITOT 0.6 02/09/2023 1857   BILITOT 0.4 07/22/2017 1339   BILIDIR 0.1 04/15/2019 0624   BILIDIR 0.15 07/05/2016 1002   IBILI 0.4 04/15/2019 0624      Component Value Date/Time   TSH 2.37 06/16/2023 0953    Medications  Outpatient Encounter Medications as of 09/17/2023  Medication Sig   acetaminophen  (TYLENOL ) 500 MG tablet Take 1 tablet (500 mg total) by mouth every 6 (six) hours as needed.   albuterol  (VENTOLIN  HFA) 108 (90 Base) MCG/ACT inhaler Inhale 2 puffs into the lungs every 6 (six) hours as needed for wheezing or shortness of breath.   Benzocaine -Resorcinol (VAGISIL EX) Apply 1 application. topically as needed.   busPIRone  (BUSPAR ) 10 MG tablet Take 1 tablet (10 mg total) by mouth 3 (three) times daily.   Calcium Carb-Cholecalciferol (CALCIUM 500 + D PO) Take 1 tablet by mouth daily.   colestipol  (COLESTID ) 1 g tablet Take 2 tablets (2 g total) by mouth 3 (three) times daily.   diclofenac  Sodium (VOLTAREN ) 1 % GEL Apply 2 g topically 4 (four) times daily.   gabapentin  (NEURONTIN ) 300 MG capsule Take 1 capsule (300 mg total) by mouth at bedtime.   hydrOXYzine  (ATARAX ) 25 MG tablet Take 1 tablet (25 mg total) by mouth 4 (four) times daily.   ibuprofen   (ADVIL ) 800 MG tablet Take 1 tablet (800 mg total) by mouth 3 (three) times daily with meals as needed for headache, moderate pain (pain score 4-6) or cramping.   ipratropium-albuterol  (DUONEB) 0.5-2.5 (3) MG/3ML SOLN Take 3 mLs by nebulization every 6 (six) hours as needed.   loperamide (IMODIUM A-D) 2 MG tablet Take 2 mg by mouth 4 (four) times daily as needed for diarrhea or  loose stools.   metoprolol  tartrate (LOPRESSOR ) 25 MG tablet Take 1 tablet (25 mg total) by mouth 2 (two) times daily.   nystatin  (MYCOSTATIN /NYSTOP ) powder Apply 1 Application topically 2 (two) times daily.   omeprazole  (PRILOSEC) 40 MG capsule Take 1 capsule (40 mg total) by mouth in the morning and at bedtime.   ondansetron  (ZOFRAN -ODT) 4 MG disintegrating tablet Take 1 tablet (4 mg total) by mouth every 6 (six) hours as needed for nausea.   oxyCODONE  (OXY IR/ROXICODONE ) 5 MG immediate release tablet Take 1 tablet (5 mg total) by mouth every 4 (four) hours as needed for severe pain (pain score 7-10) or breakthrough pain.   propranolol  (INDERAL ) 10 MG tablet Take 2 tablets (20 mg total) by mouth 3 (three) times daily as needed (palpitations).   QUEtiapine  (SEROQUEL ) 100 MG tablet Take 1 tablet (100 mg total) by mouth at bedtime.   sertraline  (ZOLOFT ) 100 MG tablet Take 2 tablets (200 mg total) by mouth at bedtime.   SUMAtriptan  (IMITREX ) 50 MG tablet Take 1 tablet at start of headache. May repeat in 2 hours if headache persists or recurs. Max 2 tablets in 24 hours.   traZODone  (DESYREL ) 100 MG tablet Take 1 tablet (100 mg total) by mouth at bedtime as needed for sleep.   metoprolol  tartrate (LOPRESSOR ) 25 MG tablet Take 1 tablet (25 mg total) by mouth 2 (two) times daily. (Patient not taking: Reported on 09/17/2023)   No facility-administered encounter medications on file as of 09/17/2023.     Assessment and Plan   POTS (postural orthostatic tachycardia syndrome)  Essential hypertension  Episode of moderate major  depression (HCC)  Gastroesophageal reflux disease without esophagitis  Lymphocytic hypophysitis (HCC)  Ehlers-Danlos disease  PCOS (polycystic ovarian syndrome)  Mild intermittent asthma without complication  Mixed hyperlipidemia  Irritable bowel syndrome with diarrhea  Class 3 severe obesity with serious comorbidity and body mass index (BMI) of 45.0 to 49.9 in adult       Obesity Treatment and Action Plan:  Patient will work on garnering support from family and friends to begin weight loss journey. Will work on eliminating or reducing the presence of highly palatable, calorie dense foods in the home. Will complete provided nutritional and psychosocial assessment questionnaire before the next appointment. Will be scheduled for indirect calorimetry to determine resting energy expenditure in a fasting state.  This will allow us  to create a reduced calorie, high-protein meal plan to promote loss of fat mass while preserving muscle mass. Counseled on the health benefits of losing 5%-15% of total body weight. Was counseled on nutritional approaches to weight loss and benefits of reducing processed foods and consuming plant-based foods and high quality protein as part of nutritional weight management. Was counseled on pharmacotherapy and role as an adjunct in weight management.   Education and Additional resources  She was weighed on the bioimpedance scale and results were discussed and documented in the synopsis.  We discussed obesity as a progressive, chronic disease and the importance of a more detailed evaluation of all the factors contributing to the disease.  We reviewed the basic principles in obesity management.   We discussed the importance of long term lifestyle changes which include nutrition, exercise and behavioral modification as well as the importance of customizing this to her specific health and social needs.  We reviewed the role of medical interventions including  pharmacotherapy and surgical interventions.   We discussed the benefits of reaching a healthier weight to alleviate the symptoms of  existing conditions and reduce the risks of the biomechanical, cardiometabolic and psychological effects of obesity.  We reviewed our program approach and philosophy, which are guided by the four pillars of obesity medicine.  We discussed how to prepare for intake appointment and the importance of fasting and avoidance of stimulants for at least 8 hours prior to indirect calorimetry.  Kristen [name] appears to be in the action stage of change and reports being ready to initiate intensive lifestyle and behavioral modifications as part of their weight loss journey.  Attestation  Reviewed by clinician on day of visit: allergies, medications, problem list, medical history, surgical history, family history, social history, and previous encounter notes pertinent to obesity diagnosis.  I have spent 51 minutes in the care of the patient today including: 24 minutes before the visit reviewing and preparing the chart. 24 minutes face-to-face assessing and reviewing listed medical problems as outlined in obesity care plan, providing nutritional and behavioral counseling on topics outlined in the obesity care plan, independently interpreting test results and goals of care, as described in assessment and plan, reviewing and discussing biometric information and progress, and reviewing latest PCP notes and specialist consultations 3 minutes after the visit updating chart and documentation of encounter.  Serge Main ANP-C

## 2023-09-18 ENCOUNTER — Other Ambulatory Visit: Payer: Self-pay

## 2023-09-18 ENCOUNTER — Ambulatory Visit: Admitting: Obstetrics and Gynecology

## 2023-09-18 VITALS — BP 114/82 | HR 62 | Wt 297.0 lb

## 2023-09-18 DIAGNOSIS — Z09 Encounter for follow-up examination after completed treatment for conditions other than malignant neoplasm: Secondary | ICD-10-CM

## 2023-09-18 DIAGNOSIS — N939 Abnormal uterine and vaginal bleeding, unspecified: Secondary | ICD-10-CM

## 2023-09-18 DIAGNOSIS — Z1331 Encounter for screening for depression: Secondary | ICD-10-CM | POA: Diagnosis not present

## 2023-09-18 DIAGNOSIS — N946 Dysmenorrhea, unspecified: Secondary | ICD-10-CM

## 2023-09-18 NOTE — Progress Notes (Unsigned)
   POSTOPERATIVE VISIT NOTE   Subjective:     Kristen [name] is a 38 y.o. G0P0 who presents to the clinic 11 weeks status post TLH, BS, cysto for abnormal uterine bleeding and pelvic pain. Eating a regular diet without difficulty. Bowel movements are normal. The patient is not having any pain. Incision: doing well  Vaginal bleeding: none Resumed sexual acitivity: no   The following portions of the patient's history were reviewed and updated as appropriate: allergies, current medications, past family history, past medical history, past social history, past surgical history, and problem list..   Review of Systems Pertinent items are noted in HPI.    Objective:    BP 114/82   Pulse 62   Wt 297 lb (134.7 kg)   BMI 47.94 kg/m  General:  alert, cooperative, and no distress  Abdomen: soft, bowel sounds active, non-tender  Incision:   healing well, no drainage, no erythema, no hernia, no seroma, no swelling, no dehiscence, incision well approximated  Pelvic:   Intact cuff on digital and visual inspection     Pathology Results: FINAL MICROSCOPIC DIAGNOSIS:   A. UTERUS WITH CERVIX AND BILATERAL FALLOPIAN TUBES, HYSTERECTOMY:  Cervix     Nabothian cyst      Negative for dysplasia  Endometrium     Proliferative with breakdown and hemorrhage      Negative for endometrial intraepithelial neoplasia (EIN) and  malignancy  Myometrium     Adenomyosis      Negative for malignancy  Bilateral fallopian tubes      Unremarkable      Negative for endometriosis and malignancy     Assessment:   Doing well postoperatively. Operative findings again reviewed. Pathology report discussed.   Plan:    1. Postop check (Primary) Doing well Discussed slow return to full activities  2. Abnormal uterine bleeding (AUB) 3. Dysmenorrhea Resolved s/p TLH   Follow up: prn  Jameel Quant, MD Obstetrician & Gynecologist, [facility] for [employer], [facility]

## 2023-09-19 ENCOUNTER — Ambulatory Visit (INDEPENDENT_AMBULATORY_CARE_PROVIDER_SITE_OTHER): Admitting: Clinical

## 2023-09-19 DIAGNOSIS — F331 Major depressive disorder, recurrent, moderate: Secondary | ICD-10-CM

## 2023-09-25 ENCOUNTER — Ambulatory Visit (INDEPENDENT_AMBULATORY_CARE_PROVIDER_SITE_OTHER): Admitting: Internal Medicine

## 2023-09-25 ENCOUNTER — Other Ambulatory Visit: Payer: Self-pay

## 2023-09-25 ENCOUNTER — Encounter (INDEPENDENT_AMBULATORY_CARE_PROVIDER_SITE_OTHER): Payer: Self-pay

## 2023-09-25 ENCOUNTER — Encounter: Payer: Self-pay | Admitting: Internal Medicine

## 2023-09-25 VITALS — BP 126/84 | HR 60 | Ht 66.0 in | Wt 300.0 lb

## 2023-09-25 DIAGNOSIS — G4733 Obstructive sleep apnea (adult) (pediatric): Secondary | ICD-10-CM

## 2023-09-25 DIAGNOSIS — J4520 Mild intermittent asthma, uncomplicated: Secondary | ICD-10-CM | POA: Diagnosis not present

## 2023-09-25 DIAGNOSIS — G4761 Periodic limb movement disorder: Secondary | ICD-10-CM

## 2023-09-25 DIAGNOSIS — R0683 Snoring: Secondary | ICD-10-CM | POA: Diagnosis not present

## 2023-09-25 LAB — IBC PANEL
Iron: 48 ug/dL (ref 42–145)
Saturation Ratios: 10.5 % — ABNORMAL LOW (ref 20.0–50.0)
TIBC: 457.8 ug/dL — ABNORMAL HIGH (ref 250.0–450.0)
Transferrin: 327 mg/dL (ref 212.0–360.0)

## 2023-09-25 MED ORDER — FLUTICASONE-SALMETEROL 115-21 MCG/ACT IN AERO
2.0000 | INHALATION_SPRAY | Freq: Two times a day (BID) | RESPIRATORY_TRACT | 12 refills | Status: AC
  Filled 2023-09-25: qty 12, 30d supply, fill #0

## 2023-09-25 NOTE — Patient Instructions (Addendum)
 It was a pleasure to see you today!  Please schedule follow up with myself in 6 months.  If my schedule is not open yet, we will contact you with a reminder closer to that time. Please call [phone] if you haven't heard from us  a month before, and always call us  sooner if issues or concerns arise. You can also send us  a message through MyChart, but but aware that this is not to be used for urgent issues and it may take up to 5-7 days to receive a reply. Please be aware that you will likely be able to view your results before I have a chance to respond to them. Please give us  5 business days to respond to any non-urgent results.   Your sleep study does not show any evidence of sleep apnea.  However you do have some snoring so I am going to refer you to an ENT for further evaluation on this to see if there is any structural reasons for the snoring which is very bothersome to you.  You do have some periodic limb movements which is consistent with your restless leg.  I am going to check your iron studies to make sure they are not low.  Sometimes iron supplementation can help improve the symptoms.  I think it is very possible that some of your medications could be causing the daytime sleepiness.  I made a send message to Dr. Vicci to see what medications you could change or decrease or come off of that might be contributing to your daytime sleepiness and fatigue.  For your asthma and going to recommend you keep an Advair  inhaler on hand.  Because you do not have daily symptoms, using this inhaler on an as needed basis may be sufficient.   I recommend starting to use the inhaler daily as prescribed if you have symptoms of asthma such as chest tightness, wheezing, coughing, shortness of breath. You should also start using it if you have exposure to a sick contact, worsening allergies, or any other trigger for your asthma. I recommend you keep using it even after your respiratory symptoms resolve for 3-4  days. The goal of this therapy is to prevent your symptoms from becoming a flare severe enough to require steroids like prednisone .   Please call our office if using this inhaler on an as needed basis is not sufficient. You might need to be seen sooner than our scheduled follow up.

## 2023-09-25 NOTE — Progress Notes (Signed)
 Kristen [name]    [id number]    1985-02-23  Primary Care Physician:Johnson, Barnie NOVAK, MD Date of Appointment: 09/25/2023 Established Patient Visit  Chief complaint:   Chief Complaint  Patient presents with   Medical Management of Chronic Issues   Obstructive Sleep Apnea    Review split night PSG done 09/04/23.      HPI: Kristen [name] is a 38 y.o. woman with mild intermittent asthma and excessive daytime sleepiness.   Interval Updates: Here for follow up after sleep study. Split night study showed PLM of 33.9/hr but PLM with arousals was 3.6/hour. AHI was 0.8 events/hour snore index 9.7 events/hr  She notes snoring at night that wakes her up and wakes up her partner.   For asthma usually takes albuterol  prn for humidity, heat, cats, dogs, perfumes. Takes albuterol  inhaler about 1-2 times/week. Did well on advair  in the past.   Has history of hospitalization and ED visit but never intubation with asthma.   I have reviewed the patient's family social and past medical history and updated as appropriate.   Past Medical History:  Diagnosis Date   Anxiety 2013   treated at [facility] by Darice Molt    Asthma 1989   no hospitalization, or intubation, previously on advair  and singulair . asthma worsening.  Follows w/ Dr. Barnie Louder @ [place]. Per pt on 01/17/23, she only uses inhaler if she is doing strenous exercise or during the change of seasons.   Chronic diarrhea    Follows w/ [facility].   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   Dysfunctional uterine bleeding    Dysrhythmia 2010   PVC's, palpitations   Ehlers-Danlos syndrome    Follows with Dr. Lauraine Mulch, Sports Medicine and PCP.   Essential hypertension 03/12/2019   Follows w/ [facility] cardiology.   Family history of adverse reaction to anesthesia    Mother woke up during deviated  septal surgery   GERD (gastroesophageal reflux disease)    History of hiatal hernia    History of suicidal ideation    IBS (irritable bowel syndrome)    Insulin  resistance    Follows w/ endocrinology.   Kyphosis of thoracic region 2004   painful, runs in dad side    Lymphocytic hypophysitis (HCC)    Follows w/ endocrinology.   Migraine    Follows w/ PCP.   Orthostatic hypotension    Follows w/  [facility], Rosaline Bane, NP.   Pancreatitis due to biliary obstruction 2003   PCOS (polycystic ovarian syndrome) 2014   facial hair, irregular periods, never been pregnant    PTSD (post-traumatic stress disorder)    Follows w/ Behavioral Health.   Sleep apnea    not currently wearing Cpap   Social phobia    Follows w/ Behavioral Health.    Past Surgical History:  Procedure Laterality Date   BIOPSY  06/11/2021   Procedure: BIOPSY;  Surgeon: Shila Gustav GAILS, MD;  Location: [facility];  Service: Gastroenterology;;   BIOPSY  02/25/2023   Procedure: BIOPSY;  Surgeon: Shila Gustav GAILS, MD;  Location: [facility];  Service: Gastroenterology;;   BLADDER SURGERY     bladder stretched as a child   CHOLECYSTECTOMY  2003   COLONOSCOPY WITH PROPOFOL  N/A 06/11/2021   Procedure: COLONOSCOPY WITH PROPOFOL ;  Surgeon: Shila Gustav GAILS, MD;  Location: [facility];  Service: Gastroenterology;  Laterality: N/A;   CYSTOSCOPY N/A 07/01/2023   Procedure: CYSTOSCOPY;  Surgeon: Jeralyn Crutch, MD;  Location: [facility];  Service: Gynecology;  Laterality: N/A;   DILATATION & CURETTAGE/HYSTEROSCOPY WITH MYOSURE N/A 12/14/2019   Procedure: DILATATION & CURETTAGE/HYSTEROSCOPY WITH Polypectomy with MYOSURE;  Surgeon: Herchel Gloris LABOR, MD;  Location: [facility];  Service: Gynecology;  Laterality: N/A;   ESOPHAGOGASTRODUODENOSCOPY (EGD) WITH PROPOFOL  N/A 02/25/2023   Procedure: ESOPHAGOGASTRODUODENOSCOPY (EGD) WITH PROPOFOL ;  Surgeon: Shila Gustav GAILS, MD;  Location: [facility];  Service:  Gastroenterology;  Laterality: N/A;   INTRAUTERINE DEVICE (IUD) INSERTION N/A 01/21/2023   Procedure: INTRAUTERINE DEVICE (IUD) INSERTION(MIRENA );  Surgeon: Eveline Lynwood MATSU, MD;  [facility];  Service: Gynecology;  Laterality: N/A;   MOHS SURGERY Left 11/2022   Right side and left leg   OPEN REDUCTION INTERNAL FIXATION (ORIF) PROXIMAL PHALANX Right 07/23/2013   Procedure: OPEN TREATMENT RIGHT RING FINGER, PROXIMAL INTERPHALANGEAL/JOINT FRACTURE DISLOCATION;  Surgeon: Alm LABOR Hummer, MD;  Location: [facility];  Service: Orthopedics;  Laterality: Right;   TONSILLECTOMY     and adenoidectomy   TOTAL LAPAROSCOPIC HYSTERECTOMY WITH SALPINGECTOMY Bilateral 07/01/2023   Procedure: HYSTERECTOMY, TOTAL, LAPAROSCOPIC, WITH SALPINGECTOMY;  Surgeon: Jeralyn Crutch, MD;  Location: [facility];  Service: Gynecology;  Laterality: Bilateral;    Family History  Problem Relation Age of Onset   Colon cancer Mother    Hypertension Mother    Arnold-Chiari malformation Mother    Restless legs syndrome Mother    Osteoporosis Mother    COPD Mother    Asthma Mother    Squamous cell carcinoma Mother        skin    Eczema Mother    Arthritis Mother    Peripheral Artery Disease Mother    Anxiety disorder Mother    OCD Mother    Colonic polyp Mother    Stroke Mother    Hypertension Father    Scoliosis Father    Bipolar disorder Father    Congestive Heart Failure Father    COPD Father    Depression Father    Alcohol abuse Maternal Aunt    Depression Maternal Aunt    Pancreatic cancer Maternal Aunt    Alcohol abuse Paternal Aunt    Depression Paternal Aunt    Diabetes Maternal Grandmother    Hypertension Maternal Grandmother    Dementia Maternal Grandmother    OCD Maternal Grandmother    Alzheimer's disease Maternal Grandmother    Bone cancer Maternal Grandfather 56       bone marrow    Multiple myeloma Maternal Grandfather    Alcohol abuse Maternal  Grandfather    Drug abuse Maternal Grandfather    Colon polyps Paternal Grandmother    Colon cancer Paternal Grandmother    Cancer Paternal Grandmother        colon   Diabetes Paternal Grandfather    Alcohol abuse Paternal Grandfather    Drug abuse Paternal Grandfather    Dementia Paternal Grandfather    ADD / ADHD Cousin    Esophageal cancer Neg Hx    Rectal cancer Neg Hx    Stomach cancer Neg Hx     Social History   Occupational History   Occupation: Unemployed   Tobacco Use   Smoking status: Never    Passive exposure: Past   Smokeless tobacco: Never  Vaping Use   Vaping status: Never Used  Substance and Sexual Activity   Alcohol use: No  Drug use: No   Sexual activity: Yes    Birth control/protection: I.U.D.     Physical Exam: Blood pressure 126/84, pulse 60, height 5' 6 (1.676 m), weight 300 lb (136.1 kg), SpO2 98%.  Gen:      No acute distress, obese ENT:  no nasal polyps, mucus membranes moist Lungs:    No increased respiratory effort, symmetric chest wall excursion, clear to auscultation bilaterally, no wheezes or crackles CV:         Regular rate and rhythm; no murmurs, rubs, or gallops.  No pedal edema   Data Reviewed: Imaging: I have personally reviewed the chest xray May 2023 no acute cardiopulmonary process  PFTs:     Latest Ref Rng & Units 02/19/2017    8:38 AM  PFT Results  FVC-Pre L 4.72   FVC-Predicted Pre % 118   FVC-Post L 5.00   FVC-Predicted Post % 125   Pre FEV1/FVC % % 76   Post FEV1/FCV % % 83   FEV1-Pre L 3.61   FEV1-Predicted Pre % 107   FEV1-Post L 4.16   DLCO uncorrected ml/min/mmHg 33.20   DLCO UNC% % 124   DLCO corrected ml/min/mmHg 32.25   DLCO COR %Predicted % 121   DLVA Predicted % 106   TLC L 6.68   TLC % Predicted % 125   RV % Predicted % 130    I have personally reviewed the patient's PFTs and normal pulmonary function.   Labs: Lab Results  Component Value Date   WBC 8.5 06/24/2023   HGB 12.3 06/24/2023    HCT 39.9 06/24/2023   MCV 81.1 06/24/2023   PLT 247 06/24/2023   Lab Results  Component Value Date   NA 139 06/24/2023   K 3.9 06/24/2023   CO2 25 06/24/2023   GLUCOSE 110 (H) 06/24/2023   BUN 12 06/24/2023   CREATININE 1.02 (H) 06/24/2023   CALCIUM 9.2 06/24/2023   GFR 69.70 06/19/2022   EGFR 80 06/16/2023   GFRNONAA >60 06/24/2023    Immunization status: Immunization History  Administered Date(s) Administered   Influenza Whole 02/02/2007   Influenza, Seasonal, Injecte, Preservative Fre 12/30/2022   Influenza,inj,Quad PF,6+ Mos 10/10/2016, 11/05/2019   PFIZER(Purple Top)SARS-COV-2 Vaccination 06/23/2019, 07/16/2019   PNEUMOCOCCAL CONJUGATE-20 05/01/2023   Tdap 12/30/2022    External Records Personally Reviewed: sleep study  Assessment:  PLMs Excessive daytime sleepiness and fatigue Snoring Mild intermittent asthma  Plan/Recommendations:  You do have some periodic limb movements which is consistent with your restless leg.  I am going to check your iron studies to make sure they are not low.  Sometimes iron supplementation can help improve the symptoms.  I think it is very possible that some of your medications could be causing the daytime sleepiness.  I made a send message to Dr. Vicci to see what medications you could change or decrease or come off of that might be contributing to your daytime sleepiness and fatigue.  For your asthma and going to recommend you keep an Advair  inhaler on hand.  Because you do not have daily symptoms, using this inhaler on an as needed basis may be sufficient.     Return to Care: Return in about 6 months (around 03/27/2024).   Verdon Gore, MD Pulmonary and Critical Care Medicine [facility] Office:[phone]

## 2023-09-26 ENCOUNTER — Ambulatory Visit: Payer: Self-pay | Admitting: Internal Medicine

## 2023-09-28 NOTE — Progress Notes (Signed)
   THERAPIST PROGRESS NOTE Virtual Visit via Video Note  I connected with Corean Jamee Sharps on 09/19/2023 at  9:00 AM EDT by a video enabled telemedicine application and verified that I am speaking with the correct person using two identifiers.  Location: Patient: home Provider: office   I discussed the limitations of evaluation and management by telemedicine and the availability of in person appointments. The patient expressed understanding and agreed to proceed.   Follow Up Instructions: I discussed the assessment and treatment plan with the patient. The patient was provided an opportunity to ask questions and all were answered. The patient agreed with the plan and demonstrated an understanding of the instructions.   The patient was advised to call back or seek an in-person evaluation if the symptoms worsen or if the condition fails to improve as anticipated.   Session Time: 20 minutes  Participation Level: Active  Behavioral Response: CasualAlertEuthymic  Type of Therapy: Individual Therapy  Treatment Goals addressed: client will engage in at least 80% of scheduled individual psychotherapy sessions  ProgressTowards Goals: Progressing  Interventions: CBT and Supportive  Summary:  Oluwatomisin Hustead is a 38 y.o. female who presents for the scheduled appointment oriented times five, appropriately dressed and friendly. Client denied hallucination and delusions. Client reported she is doing well this morning. Client reported she has good news of being released from her doctor after having surgery. Client reported she is excited to get back to going to the gym. Client reported she will start next week. Client reported as of currently she is not happy or sad just on autopilot. Client reported she has been limiting her screen time from social media for her mental health. Client reported she is doing well with her medication regimen still. Client reported she needs to  reschedule her medication appt with her psychiatrist. Evidence of progress towards goal:  client reported 0 depressive symptoms at this time.  Suicidal/Homicidal: Nowithout intent/plan  Therapist Response:  Therapist began the appointment asking how she has been doing. Therapist engaged using active listening and positive emotional support. Therapist used cbt to ask her about current severity of any depressive symptoms or stressors. Therapist used cbt to positively reinforce her activity outside of the home. Therapist used CBT ask the client to identify her progress with frequency of use with coping skills with continued practice in her daily activity.      Plan: Return again in 3 weeks.  Diagnosis: moderate recurrent major depression  Collaboration of Care: Patient refused AEB none requested by the client.  Patient/Guardian was advised Release of Information must be obtained prior to any record release in order to collaborate their care with an outside provider. Patient/Guardian was advised if they have not already done so to contact the registration department to sign all necessary forms in order for us  to release information regarding their care.   Consent: Patient/Guardian gives verbal consent for treatment and assignment of benefits for services provided during this visit. Patient/Guardian expressed understanding and agreed to proceed.   Dyland Panuco Y Jaycee Pelzer, LCSW 09/19/2023

## 2023-09-29 NOTE — Telephone Encounter (Signed)
**Note De-identified  [place] Obfuscation** Please advise 

## 2023-09-30 ENCOUNTER — Telehealth: Payer: Self-pay | Admitting: Internal Medicine

## 2023-09-30 ENCOUNTER — Other Ambulatory Visit: Payer: Self-pay | Admitting: Internal Medicine

## 2023-09-30 DIAGNOSIS — E611 Iron deficiency: Secondary | ICD-10-CM

## 2023-09-30 NOTE — Telephone Encounter (Signed)
 Error

## 2023-09-30 NOTE — Telephone Encounter (Signed)
 Called patient to confirm upcoming appointment 10/02/2023. Patient appointment has been successfully confirmed

## 2023-10-01 ENCOUNTER — Other Ambulatory Visit: Payer: Self-pay

## 2023-10-02 ENCOUNTER — Ambulatory Visit: Attending: Internal Medicine | Admitting: Internal Medicine

## 2023-10-02 ENCOUNTER — Telehealth (INDEPENDENT_AMBULATORY_CARE_PROVIDER_SITE_OTHER): Admitting: Family

## 2023-10-02 ENCOUNTER — Other Ambulatory Visit: Payer: Self-pay

## 2023-10-02 VITALS — BP 129/84 | HR 63 | Temp 97.8°F | Ht 66.0 in | Wt 304.0 lb

## 2023-10-02 DIAGNOSIS — Z23 Encounter for immunization: Secondary | ICD-10-CM | POA: Diagnosis not present

## 2023-10-02 DIAGNOSIS — F4010 Social phobia, unspecified: Secondary | ICD-10-CM | POA: Diagnosis not present

## 2023-10-02 DIAGNOSIS — R3915 Urgency of urination: Secondary | ICD-10-CM

## 2023-10-02 DIAGNOSIS — E611 Iron deficiency: Secondary | ICD-10-CM | POA: Diagnosis not present

## 2023-10-02 DIAGNOSIS — I10 Essential (primary) hypertension: Secondary | ICD-10-CM

## 2023-10-02 DIAGNOSIS — F331 Major depressive disorder, recurrent, moderate: Secondary | ICD-10-CM

## 2023-10-02 DIAGNOSIS — G2581 Restless legs syndrome: Secondary | ICD-10-CM

## 2023-10-02 LAB — POCT URINALYSIS DIP (CLINITEK)
Bilirubin, UA: NEGATIVE
Glucose, UA: NEGATIVE mg/dL
Ketones, POC UA: NEGATIVE mg/dL
Leukocytes, UA: NEGATIVE
Nitrite, UA: NEGATIVE
POC PROTEIN,UA: 100 — AB
Spec Grav, UA: 1.02 (ref 1.010–1.025)
Urobilinogen, UA: 0.2 U/dL — AB
pH, UA: 7.5 (ref 5.0–8.0)

## 2023-10-02 MED ORDER — SULFAMETHOXAZOLE-TRIMETHOPRIM 800-160 MG PO TABS
1.0000 | ORAL_TABLET | Freq: Two times a day (BID) | ORAL | 0 refills | Status: DC
  Filled 2023-10-02: qty 5, 3d supply, fill #0

## 2023-10-02 NOTE — Patient Instructions (Signed)
 VISIT SUMMARY:  Today, we discussed your recovery after your hysterectomy and addressed your new urinary symptoms. You mentioned feeling relieved from the heavy menstrual bleeding you experienced before the surgery. We also reviewed your blood pressure management, weight fluctuations, and dietary habits.  YOUR PLAN:  -URINARY TRACT INFECTION: A urinary tract infection (UTI) is an infection in any part of your urinary system. Your symptoms of urinary frequency and urgency are consistent with a UTI. Your urinalysis showed signs of infection, so I have prescribed Bactrim  to be taken twice a day for 5 days.  -ESSENTIAL HYPERTENSION: Essential hypertension is high blood pressure with no identifiable cause. Your blood pressure readings at home are within a good range, but your diastolic pressure is slightly elevated. Continue taking metoprolol  and be mindful of your salt intake to help manage your blood pressure.  -OBESITY: Obesity is a condition where you have an excessive amount of body fat. Your weight has been fluctuating, possibly due to hormonal changes after your hysterectomy. You plan to return to the gym and are considering a medical weight management program. Continue avoiding sugary drinks and snacks, and try to reduce your intake of white bread, rice, and potatoes. Keep incorporating more fruits and vegetables into your diet.  INSTRUCTIONS:  Please take Bactrim  as prescribed for your UTI. Continue monitoring your blood pressure at home and limit your salt intake. Try to attend the gym as planned and explore options like [employer] for a more affordable membership. Consider starting the medical weight management program when feasible. Follow up with us  if your symptoms persist or if you have any concerns.

## 2023-10-02 NOTE — Progress Notes (Signed)
 Patient ID: Kristen [name], female    DOB: 1985/04/19  MRN: [id number]  CC: Hypertension (HTN f/u. /Urinary frequency X1 day/Flu vax administered on 10/02/2023 - C.A.)   Subjective: Kristen [name] is a 38 y.o. female who presents for chronic ds management. Her significant other, Kristen [name], is with her.  Her concerns today include:  Pt with hx of anx/dep, IBS,  PCOS, morbid obesity, Dep, RLS, social phobia, lymphocytic hypophysitis, hyperprolactinemia, severe persistent asthma, migraines, OSA (thought study done 03/2023 by neurology revealed no OSA) CPAP, RLS, Elhers Danlo.   Discussed the use of AI scribe software for clinical note transcription with the patient, who gave verbal consent to proceed.  History of Present Illness Kristen [name] is a 38 year old female who presents for chronic disease management She underwent a hysterectomy in June. She no longer experiences heavy menstrual bleeding, which previously required frequent purchases of period products and caused damage to personal items. She feels relieved at not having to manage these issues anymore.  Today, she experiences urinary frequency and urgency, which began today. There is no burning sensation during urination. A urine sample was provided for analysis.  She is currently taking metoprolol  for hypertension, with systolic readings ranging from 126 to 130 and diastolic readings from 80 to 87. She is mindful of her salt intake.  Her weight has been fluctuating, and she believes it may be related to hormonal changes after her hysterectomy. Her weight occasionally rises above 300 pounds but then decreases again. She has been cleared to return to the gym by her gynecologist and plans to attend two to three times a week, though financial constraints and caregiving responsibilities for her disabled parents pose challenges.  She is on SSI, receiving $500 a month, which limits her ability to afford a gym membership. She is  exploring options like Silver Sneakers through her insurance. She is also interested in starting a medical weight management program, which requires a $100 initial payment. She has spoken to them already and plans to start once she makes the initial payment. Her diet includes fruits and vegetables, some of which she grows in her garden. She avoids sugary drinks and snacks and incorporates vegetables into meals creatively to prevent dietary monotony.  Had recent iron studies done through her pulmonologist Dr. Meade due to periodic limb movements noted on sleep study and history of restless leg.  Iron level 48 with a percent saturation of 10.5 and TIBC elevated at 457.  However CBC and ferritin was not checked and she wanted it to be checked.  I agreed to check these levels on her today.    Patient Active Problem List   Diagnosis Date Noted   Abnormal uterine bleeding 07/01/2023   Abnormal uterine bleeding (AUB) 07/01/2023   Melena 02/25/2023   Abdominal pain, chronic, epigastric 02/25/2023   Diarrhea    Chronic diarrhea    Weight gain 11/01/2020   Hirsutism 11/01/2020   Gastroesophageal reflux disease without esophagitis 07/11/2020   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   POTS (postural orthostatic tachycardia syndrome) 06/09/2020   Morbid obesity (HCC) 12/14/2019   Endometrial polyp    Encounter for autism screening 11/18/2019   DUB (dysfunctional uterine bleeding) 11/08/2019   PTSD (post-traumatic stress disorder) 09/20/2019   Irritable bowel syndrome with diarrhea 04/23/2019   Abnormal serum thyroid  stimulating hormone (TSH) level 04/23/2019   OSA on CPAP 03/17/2019   Anxiety and depression 03/12/2019   Chronic migraine with aura 03/12/2019   Essential hypertension 03/12/2019  Mild intermittent asthma without complication 03/12/2019   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   Elevated prolactin level 07/05/2016   Vulvar lesion 07/05/2016   Restless legs 05/24/2016   Seasonal  allergies 05/13/2014   Intertrigo 05/13/2014   Decreased vision 05/13/2014   PCOS (polycystic ovarian syndrome) 02/07/2014   Social phobia 02/02/2007   Attention deficit disorder 02/02/2007   DYSLEXIA 02/02/2007   Idiopathic scoliosis and kyphoscoliosis 02/02/2007   OTHER SPECIFIED CONGENITAL ANOMALIES OF KIDNEY 02/02/2007   ELECTROCARDIOGRAM, ABNORMAL 02/02/2007     Current Outpatient Medications on File Prior to Visit  Medication Sig Dispense Refill   acetaminophen  (TYLENOL ) 500 MG tablet Take 1 tablet (500 mg total) by mouth every 6 (six) hours as needed. 120 tablet 0   albuterol  (VENTOLIN  HFA) 108 (90 Base) MCG/ACT inhaler Inhale 2 puffs into the lungs every 6 (six) hours as needed for wheezing or shortness of breath. 18 g 6   Benzocaine -Resorcinol (VAGISIL EX) Apply 1 application. topically as needed.     busPIRone  (BUSPAR ) 10 MG tablet Take 1 tablet (10 mg total) by mouth 3 (three) times daily. 90 tablet 3   Calcium Carb-Cholecalciferol (CALCIUM 500 + D PO) Take 1 tablet by mouth daily.     colestipol  (COLESTID ) 1 g tablet Take 2 tablets (2 g total) by mouth 3 (three) times daily. 180 tablet 2   diclofenac  Sodium (VOLTAREN ) 1 % GEL Apply 2 g topically 4 (four) times daily. 100 g 2   fluticasone -salmeterol (ADVAIR  HFA) 115-21 MCG/ACT inhaler Inhale 2 puffs into the lungs 2 (two) times daily. 12 g 12   gabapentin  (NEURONTIN ) 300 MG capsule Take 1 capsule (300 mg total) by mouth at bedtime. 90 capsule 1   hydrOXYzine  (ATARAX ) 25 MG tablet Take 1 tablet (25 mg total) by mouth 4 (four) times daily. 120 tablet 3   ibuprofen  (ADVIL ) 800 MG tablet Take 1 tablet (800 mg total) by mouth 3 (three) times daily with meals as needed for headache, moderate pain (pain score 4-6) or cramping. 60 tablet 0   ipratropium-albuterol  (DUONEB) 0.5-2.5 (3) MG/3ML SOLN Take 3 mLs by nebulization every 6 (six) hours as needed. 90 mL 1   loperamide (IMODIUM A-D) 2 MG tablet Take 2 mg by mouth 4 (four) times  daily as needed for diarrhea or loose stools.     metoprolol  tartrate (LOPRESSOR ) 25 MG tablet Take 1 tablet (25 mg total) by mouth 2 (two) times daily. 60 tablet 0   metoprolol  tartrate (LOPRESSOR ) 25 MG tablet Take 1 tablet (25 mg total) by mouth 2 (two) times daily. 180 tablet 1   nystatin  (MYCOSTATIN /NYSTOP ) powder Apply 1 Application topically 2 (two) times daily. 15 g 4   omeprazole  (PRILOSEC) 40 MG capsule Take 1 capsule (40 mg total) by mouth in the morning and at bedtime. 180 capsule 1   ondansetron  (ZOFRAN -ODT) 4 MG disintegrating tablet Take 1 tablet (4 mg total) by mouth every 6 (six) hours as needed for nausea. 20 tablet 0   propranolol  (INDERAL ) 10 MG tablet Take 2 tablets (20 mg total) by mouth 3 (three) times daily as needed (palpitations). 60 tablet 11   QUEtiapine  (SEROQUEL ) 100 MG tablet Take 1 tablet (100 mg total) by mouth at bedtime. 30 tablet 3   sertraline  (ZOLOFT ) 100 MG tablet Take 2 tablets (200 mg total) by mouth at bedtime. 60 tablet 3   SUMAtriptan  (IMITREX ) 50 MG tablet Take 1 tablet at start of headache. May repeat in 2 hours if headache persists or recurs.  Max 2 tablets in 24 hours. 10 tablet 3   traZODone  (DESYREL ) 100 MG tablet Take 1 tablet (100 mg total) by mouth at bedtime as needed for sleep. 30 tablet 3   No current facility-administered medications on file prior to visit.    Allergies  Allergen Reactions   Latex Hives and Swelling   Mucinex [Guaifenesin Er] Hives, Itching and Swelling   Other Anaphylaxis and Nausea Only    Pickles - throat swelling and nausea    Penicillins Swelling    Did it involve swelling of the face/tongue/throat, SOB, or low BP? Yes Did it involve sudden or severe rash/hives, skin peeling, or any reaction on the inside of your mouth or nose? No Did you need to seek medical attention at a hospital or doctor's office? Yes When did it last happen?      >10 years ago If all above answers are "NO", may proceed with cephalosporin  use.    Contrast Media [Iodinated Contrast Media] Nausea And Vomiting   Multihance  [Gadobenate] Nausea And Vomiting    Social History   Socioeconomic History   Marital status: Single    Spouse name: Not on file   Number of children: 0   Years of education: college    Highest education level: Not on file  Occupational History   Occupation: Unemployed   Tobacco Use   Smoking status: Never    Passive exposure: Past   Smokeless tobacco: Never  Vaping Use   Vaping status: Never Used  Substance and Sexual Activity   Alcohol use: No   Drug use: No   Sexual activity: Yes    Birth control/protection: I.U.D.  Other Topics Concern   Not on file  Social History Narrative   Lives with mom Sheliah)    Right-handed   Caffeine : 1 cups of coffee per day (rare)   Working not working.  Disabled.    Social Drivers of Health   Financial Resource Strain: Medium Risk (07/28/2023)   Overall Financial Resource Strain (CARDIA)    Difficulty of Paying Living Expenses: Somewhat hard  Food Insecurity: Food Insecurity Present (09/18/2023)   Hunger Vital Sign    Worried About Running Out of Food in the Last Year: Often true    Ran Out of Food in the Last Year: Sometimes true  Transportation Needs: No Transportation Needs (07/28/2023)   PRAPARE - Administrator, Civil Service (Medical): No    Lack of Transportation (Non-Medical): No  Physical Activity: Insufficiently Active (07/28/2023)   Exercise Vital Sign    Days of Exercise per Week: 2 days    Minutes of Exercise per Session: 10 min  Stress: Stress Concern Present (07/28/2023)   [employer] of Occupational Health - Occupational Stress Questionnaire    Feeling of Stress: To some extent  Social Connections: Socially Isolated (07/28/2023)   Social Connection and Isolation Panel    Frequency of Communication with Friends and Family: Once a week    Frequency of Social Gatherings with Friends and Family: Once a week    Attends  Religious Services: Never    Database administrator or Organizations: No    Attends Banker Meetings: Never    Marital Status: Never married  Intimate Partner Violence: Not At Risk (07/28/2023)   Humiliation, Afraid, Rape, and Kick questionnaire    Fear of Current or Ex-Partner: No    Emotionally Abused: No    Physically Abused: No    Sexually Abused: No  Family History  Problem Relation Age of Onset   Colon cancer Mother    Hypertension Mother    Arnold-Chiari malformation Mother    Restless legs syndrome Mother    Osteoporosis Mother    COPD Mother    Asthma Mother    Squamous cell carcinoma Mother        skin    Eczema Mother    Arthritis Mother    Peripheral Artery Disease Mother    Anxiety disorder Mother    OCD Mother    Colonic polyp Mother    Stroke Mother    Hypertension Father    Scoliosis Father    Bipolar disorder Father    Congestive Heart Failure Father    COPD Father    Depression Father    Alcohol abuse Maternal Aunt    Depression Maternal Aunt    Pancreatic cancer Maternal Aunt    Alcohol abuse Paternal Aunt    Depression Paternal Aunt    Diabetes Maternal Grandmother    Hypertension Maternal Grandmother    Dementia Maternal Grandmother    OCD Maternal Grandmother    Alzheimer's disease Maternal Grandmother    Bone cancer Maternal Grandfather 38       bone marrow    Multiple myeloma Maternal Grandfather    Alcohol abuse Maternal Grandfather    Drug abuse Maternal Grandfather    Colon polyps Paternal Grandmother    Colon cancer Paternal Grandmother    Cancer Paternal Grandmother        colon   Diabetes Paternal Grandfather    Alcohol abuse Paternal Grandfather    Drug abuse Paternal Grandfather    Dementia Paternal Grandfather    ADD / ADHD Cousin    Esophageal cancer Neg Hx    Rectal cancer Neg Hx    Stomach cancer Neg Hx     Past Surgical History:  Procedure Laterality Date   BIOPSY  06/11/2021   Procedure: BIOPSY;   Surgeon: Shila Gustav GAILS, MD;  Location: [facility];  Service: Gastroenterology;;   BIOPSY  02/25/2023   Procedure: BIOPSY;  Surgeon: Shila Gustav GAILS, MD;  Location: [facility];  Service: Gastroenterology;;   BLADDER SURGERY     bladder stretched as a child   CHOLECYSTECTOMY  2003   COLONOSCOPY WITH PROPOFOL  N/A 06/11/2021   Procedure: COLONOSCOPY WITH PROPOFOL ;  Surgeon: Nandigam, Kavitha V, MD;  Location: [facility];  Service: Gastroenterology;  Laterality: N/A;   CYSTOSCOPY N/A 07/01/2023   Procedure: CYSTOSCOPY;  Surgeon: Jeralyn Crutch, MD;  Location: [facility];  Service: Gynecology;  Laterality: N/A;   DILATATION & CURETTAGE/HYSTEROSCOPY WITH MYOSURE N/A 12/14/2019   Procedure: DILATATION & CURETTAGE/HYSTEROSCOPY WITH Polypectomy with MYOSURE;  Surgeon: Herchel Gloris LABOR, MD;  Location: [facility];  Service: Gynecology;  Laterality: N/A;   ESOPHAGOGASTRODUODENOSCOPY (EGD) WITH PROPOFOL  N/A 02/25/2023   Procedure: ESOPHAGOGASTRODUODENOSCOPY (EGD) WITH PROPOFOL ;  Surgeon: Shila Gustav GAILS, MD;  Location: [facility];  Service: Gastroenterology;  Laterality: N/A;   INTRAUTERINE DEVICE (IUD) INSERTION N/A 01/21/2023   Procedure: INTRAUTERINE DEVICE (IUD) INSERTION(MIRENA );  Surgeon: Eveline Lynwood MATSU, MD;  Location: [facility];  Service: Gynecology;  Laterality: N/A;   MOHS SURGERY Left 11/2022   Right side and left leg   OPEN REDUCTION INTERNAL FIXATION (ORIF) PROXIMAL PHALANX Right 07/23/2013   Procedure: OPEN TREATMENT RIGHT RING FINGER, PROXIMAL INTERPHALANGEAL/JOINT FRACTURE DISLOCATION;  Surgeon: Alm LABOR Hummer, MD;  Location: [facility];  Service: Orthopedics;  Laterality: Right;   TONSILLECTOMY  and adenoidectomy   TOTAL LAPAROSCOPIC HYSTERECTOMY WITH SALPINGECTOMY Bilateral 07/01/2023   Procedure: HYSTERECTOMY, TOTAL, LAPAROSCOPIC, WITH SALPINGECTOMY;  Surgeon: Jeralyn Crutch, MD;  Location: [facility];  Service: Gynecology;  Laterality:  Bilateral;    ROS: Review of Systems Negative except as stated above  PHYSICAL EXAM: BP 129/84 (BP Location: Left Arm, Patient Position: Sitting, Cuff Size: Large)   Pulse 63   Temp 97.8 F (36.6 C) (Oral)   Ht 5' 6 (1.676 m)   Wt (!) 304 lb (137.9 kg)   SpO2 99%   BMI 49.07 kg/m   Wt Readings from Last 3 Encounters:  10/02/23 (!) 304 lb (137.9 kg)  09/25/23 300 lb (136.1 kg)  09/18/23 297 lb (134.7 kg)    Physical Exam  General appearance - alert, well appearing, and in no distress Mental status - normal mood, behavior, speech, dress, motor activity, and thought processes Chest - clear to auscultation, no wheezes, rales or rhonchi, symmetric air entry Heart - normal rate, regular rhythm, normal S1, S2, no murmurs, rubs, clicks or gallops Extremities - peripheral pulses normal, no pedal edema, no clubbing or cyanosis  Results for orders placed or performed in visit on 09/25/23  IBC panel   Collection Time: 09/25/23 11:50 AM  Result Value Ref Range   Iron 48 42 - 145 ug/dL   Transferrin 672.9 787.9 - 360.0 mg/dL   Saturation Ratios 89.4 (L) 20.0 - 50.0 %   TIBC 457.8 (H) 250.0 - 450.0 mcg/dL   Results for orders placed or performed in visit on 10/02/23  POCT URINALYSIS DIP (CLINITEK)   Collection Time: 10/02/23  5:47 PM  Result Value Ref Range   Color, UA yellow yellow   Clarity, UA cloudy (A) clear   Glucose, UA negative negative mg/dL   Bilirubin, UA negative negative   Ketones, POC UA negative negative mg/dL   Spec Grav, UA 8.979 8.989 - 1.025   Blood, UA large (A) negative   pH, UA 7.5 5.0 - 8.0   POC PROTEIN,UA =100 (A) negative, trace   Urobilinogen, UA 0.2 (A) 0.2 or 1.0 E.U./dL   Nitrite, UA Negative Negative   Leukocytes, UA Negative Negative        Latest Ref Rng & Units 06/24/2023    2:25 PM 06/16/2023    9:53 AM 02/09/2023    6:57 PM  CMP  Glucose 70 - 99 mg/dL 889  90  91   BUN 6 - 20 mg/dL 12  14  11    Creatinine 0.44 - 1.00 mg/dL 8.97   9.04  9.09   Sodium 135 - 145 mmol/L 139  140  138   Potassium 3.5 - 5.1 mmol/L 3.9  4.5  3.6   Chloride 98 - 111 mmol/L 107  106  107   CO2 22 - 32 mmol/L 25  28  22    Calcium 8.9 - 10.3 mg/dL 9.2  9.5  8.7   Total Protein 6.5 - 8.1 g/dL   6.6   Total Bilirubin 0.0 - 1.2 mg/dL   0.6   Alkaline Phos 38 - 126 U/L   116   AST 15 - 41 U/L   26   ALT 0 - 44 U/L   29    Lipid Panel     Component Value Date/Time   CHOL 206 (H) 06/16/2023 0953   TRIG 169 (H) 06/16/2023 0953   HDL 35 (L) 06/16/2023 0953   CHOLHDL 5.9 (H) 06/16/2023 0953   VLDL 25.6 06/18/2022 1025  LDLCALC 140 (H) 06/16/2023 0953    CBC    Component Value Date/Time   WBC 8.5 06/24/2023 1425   RBC 4.92 06/24/2023 1425   HGB 12.3 06/24/2023 1425   HGB 13.3 06/27/2022 1434   HCT 39.9 06/24/2023 1425   HCT 39.9 06/27/2022 1434   PLT 247 06/24/2023 1425   PLT 278 06/27/2022 1434   MCV 81.1 06/24/2023 1425   MCV 85 06/27/2022 1434   MCH 25.0 (L) 06/24/2023 1425   MCHC 30.8 06/24/2023 1425   RDW 15.0 06/24/2023 1425   RDW 13.5 06/27/2022 1434   LYMPHSABS 2.6 02/09/2023 1857   LYMPHSABS 3.0 06/27/2022 1434   MONOABS 0.4 02/09/2023 1857   EOSABS 0.4 02/09/2023 1857   EOSABS 0.2 06/27/2022 1434   BASOSABS 0.0 02/09/2023 1857   BASOSABS 0.1 06/27/2022 1434    ASSESSMENT AND PLAN: 1. Essential hypertension (Primary) Close to goal.  Continue current dose of metoprolol .  2. Urgency of urination - sulfamethoxazole -trimethoprim  (BACTRIM  DS) 800-160 MG tablet; Take 1 tablet by mouth 2 (two) times daily.  Dispense: 5 tablet; Refill: 0  3. Morbid obesity (HCC) Encourage healthy eating habits. Encouraged her goals to get back to the gym.  She will check with her insurance to see if she qualifies for Silver sneakers.  She plans to get in with medical weight management as well.  4. Iron deficiency Recent iron studies suggest iron deficiency but ferritin level was not included.  Check CBC and ferritin today. -  Ferritin - CBC  5. Need for influenza vaccination - Flu vaccine trivalent PF, 6mos and older(Flulaval,Afluria,Fluarix,Fluzone)  Patient was given the opportunity to ask questions.  Patient verbalized understanding of the plan and was able to repeat key elements of the plan.   This documentation was completed using Paediatric nurse.  Any transcriptional errors are unintentional.  Orders Placed This Encounter  Procedures   Flu vaccine trivalent PF, 6mos and older(Flulaval,Afluria,Fluarix,Fluzone)     Requested Prescriptions   Signed Prescriptions Disp Refills   sulfamethoxazole -trimethoprim  (BACTRIM  DS) 800-160 MG tablet 5 tablet 0    Sig: Take 1 tablet by mouth 2 (two) times daily.    Return in about 4 months (around 02/01/2024).  Barnie Louder, MD, FACP

## 2023-10-03 ENCOUNTER — Other Ambulatory Visit: Payer: Self-pay | Admitting: Internal Medicine

## 2023-10-03 ENCOUNTER — Other Ambulatory Visit: Payer: Self-pay

## 2023-10-03 ENCOUNTER — Ambulatory Visit: Payer: Self-pay | Admitting: Internal Medicine

## 2023-10-03 ENCOUNTER — Encounter (HOSPITAL_COMMUNITY): Payer: Self-pay | Admitting: Family

## 2023-10-03 ENCOUNTER — Ambulatory Visit (HOSPITAL_COMMUNITY): Admitting: Clinical

## 2023-10-03 ENCOUNTER — Encounter (HOSPITAL_COMMUNITY): Payer: Self-pay

## 2023-10-03 DIAGNOSIS — E611 Iron deficiency: Secondary | ICD-10-CM

## 2023-10-03 LAB — CBC
Hematocrit: 36.1 % (ref 34.0–46.6)
Hemoglobin: 11.7 g/dL (ref 11.1–15.9)
MCH: 27 pg (ref 26.6–33.0)
MCHC: 32.4 g/dL (ref 31.5–35.7)
MCV: 83 fL (ref 79–97)
Platelets: 255 x10E3/uL (ref 150–450)
RBC: 4.33 x10E6/uL (ref 3.77–5.28)
RDW: 14.5 % (ref 11.7–15.4)
WBC: 9.4 x10E3/uL (ref 3.4–10.8)

## 2023-10-03 LAB — FERRITIN: Ferritin: 14 ng/mL — ABNORMAL LOW (ref 15–150)

## 2023-10-03 MED ORDER — BUSPIRONE HCL 10 MG PO TABS
10.0000 mg | ORAL_TABLET | Freq: Three times a day (TID) | ORAL | 3 refills | Status: DC
  Filled 2023-10-03: qty 90, 30d supply, fill #0

## 2023-10-03 MED ORDER — QUETIAPINE FUMARATE 100 MG PO TABS
100.0000 mg | ORAL_TABLET | Freq: Every day | ORAL | 3 refills | Status: DC
  Filled 2023-10-03: qty 30, 30d supply, fill #0

## 2023-10-03 MED ORDER — SERTRALINE HCL 100 MG PO TABS
200.0000 mg | ORAL_TABLET | Freq: Every day | ORAL | 3 refills | Status: DC
  Filled 2023-10-03: qty 60, 30d supply, fill #0

## 2023-10-03 MED ORDER — HYDROXYZINE HCL 25 MG PO TABS
25.0000 mg | ORAL_TABLET | Freq: Four times a day (QID) | ORAL | 3 refills | Status: DC
  Filled 2023-10-03: qty 120, 30d supply, fill #0

## 2023-10-03 MED ORDER — GABAPENTIN 300 MG PO CAPS
300.0000 mg | ORAL_CAPSULE | Freq: Every day | ORAL | 1 refills | Status: AC
  Filled 2023-10-03: qty 90, 90d supply, fill #0

## 2023-10-03 NOTE — Progress Notes (Signed)
 BH MD/PA/NP OP Progress Note Virtual Visit via Video Note  I connected with Kristen [name] on 10/03/23 at  9:30 AM EDT by a video enabled telemedicine application and verified that I am speaking with the correct person using two identifiers.  Location: Patient: Home Provider: Clinic   I discussed the limitations of evaluation and management by telemedicine and the availability of in person appointments. The patient expressed understanding and agreed to proceed.  I provided 30 minutes of non-face-to-face time during this encounter.      10/03/2023 8:58 AM Kristen [name]  MRN:  [id number]  Chief Complaint: I am recovering from covid  HPI:  38 year old female seen today for follow up psychiatric evaluation.  She has a psychiatric history of social phobia, ADD, anxiety, depression, and PTSD.  She is currently being managed on Seroquel   100 mg nightly, Buspar  10 mg three times daily,  Zoloft  200 mg daily, trazodone  50 mg nightly as needed, hydroxyzine  25 mg 4 times a day as needed, gabapentin  300 mg nightly (prescribed by PCP), and propanolol 20 mg 2 times daily (recives from PCP).  She notes that her medications are effective in managing her psychiatric conditions.   Patient presented well-groomed, pleasant, cooperative, and engaged in conversation, maintaining appropriate eye contact. She reported that things have been going fairly well overall. She recently had to cancel her gym membership due to financial strain, including an unexpected veterinary bill for her cat's urinary tract infection. She was encouraged to consider affordable alternatives such as [employer] ($10/month) or incorporating walking and other forms of cardio into her routine.  Mentally, the patient describes herself as being in a better place than at her prior visit. A GAD-7 was administered with a score of 9. A PHQ-9 was also conducted, with the patient scoring a 5, improved from her last score of 18.  She endorses adequate sleep and appetite and denies suicidal ideation, homicidal ideation, violent or auditory hallucinations, mania, or paranoia.  Visit Diagnosis:    ICD-10-CM   1. Social phobia  F40.10 sertraline  (ZOLOFT ) 100 MG tablet    hydrOXYzine  (ATARAX ) 25 MG tablet    busPIRone  (BUSPAR ) 10 MG tablet    2. Moderate episode of recurrent major depressive disorder (HCC)  F33.1 sertraline  (ZOLOFT ) 100 MG tablet    QUEtiapine  (SEROQUEL ) 100 MG tablet    busPIRone  (BUSPAR ) 10 MG tablet    3. Restless legs  G25.81 gabapentin  (NEURONTIN ) 300 MG capsule           Past Psychiatric History: social phobia, ADD, anxiety, depression, and PTSD.  Past Medical History:  Past Medical History:  Diagnosis Date   Anxiety 2013   treated at [facility] by Darice Molt    Asthma 1989   no hospitalization, or intubation, previously on advair  and singulair . asthma worsening.  Follows w/ Dr. Barnie Louder @ [facility]. Per pt on 01/17/23, she only uses inhaler if she is doing strenous exercise or during the change of seasons.   Chronic diarrhea    Follows w/ [facility].   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   Dysfunctional uterine bleeding    Dysrhythmia 2010   PVC's, palpitations   Ehlers-Danlos syndrome    Follows with Dr. Lauraine Mulch, Sports Medicine and PCP.   Essential hypertension 03/12/2019   Follows w/ [facility] cardiology.   Family history  of adverse reaction to anesthesia    Mother woke up during deviated septal surgery   GERD (gastroesophageal reflux disease)    History of hiatal hernia    History of suicidal ideation    IBS (irritable bowel syndrome)    Insulin  resistance    Follows w/ endocrinology.   Kyphosis of thoracic region 2004   painful, runs in dad side    Lymphocytic hypophysitis (HCC)    Follows w/ endocrinology.   Migraine    Follows w/ PCP.   Orthostatic  hypotension    Follows w/  [facility], Rosaline Bane, NP.   Pancreatitis due to biliary obstruction 2003   PCOS (polycystic ovarian syndrome) 2014   facial hair, irregular periods, never been pregnant    PTSD (post-traumatic stress disorder)    Follows w/ Behavioral Health.   Sleep apnea    not currently wearing Cpap   Social phobia    Follows w/ Behavioral Health.    Past Surgical History:  Procedure Laterality Date   BIOPSY  06/11/2021   Procedure: BIOPSY;  Surgeon: Shila Gustav GAILS, MD;  Location: [facility];  Service: Gastroenterology;;   BIOPSY  02/25/2023   Procedure: BIOPSY;  Surgeon: Shila Gustav GAILS, MD;  Location: [facility];  Service: Gastroenterology;;   BLADDER SURGERY     bladder stretched as a child   CHOLECYSTECTOMY  2003   COLONOSCOPY WITH PROPOFOL  N/A 06/11/2021   Procedure: COLONOSCOPY WITH PROPOFOL ;  Surgeon: Shila Gustav GAILS, MD;  Location: [facility];  Service: Gastroenterology;  Laterality: N/A;   CYSTOSCOPY N/A 07/01/2023   Procedure: CYSTOSCOPY;  Surgeon: Jeralyn Crutch, MD;  Location: [facility];  Service: Gynecology;  Laterality: N/A;   DILATATION & CURETTAGE/HYSTEROSCOPY WITH MYOSURE N/A 12/14/2019   Procedure: DILATATION & CURETTAGE/HYSTEROSCOPY WITH Polypectomy with MYOSURE;  Surgeon: Herchel Gloris LABOR, MD;  Location: [facility];  Service: Gynecology;  Laterality: N/A;   ESOPHAGOGASTRODUODENOSCOPY (EGD) WITH PROPOFOL  N/A 02/25/2023   Procedure: ESOPHAGOGASTRODUODENOSCOPY (EGD) WITH PROPOFOL ;  Surgeon: Shila Gustav GAILS, MD;  Location: [facility];  Service: Gastroenterology;  Laterality: N/A;   INTRAUTERINE DEVICE (IUD) INSERTION N/A 01/21/2023   Procedure: INTRAUTERINE DEVICE (IUD) INSERTION(MIRENA );  Surgeon: Eveline Lynwood MATSU, MD;  Location[facility];  Service: Gynecology;  Laterality: N/A;   MOHS SURGERY Left 11/2022   Right side and left leg   OPEN REDUCTION INTERNAL FIXATION (ORIF) PROXIMAL PHALANX Right  07/23/2013   Procedure: OPEN TREATMENT RIGHT RING FINGER, PROXIMAL INTERPHALANGEAL/JOINT FRACTURE DISLOCATION;  Surgeon: Alm LABOR Hummer, MD;  Location: [facility];  Service: Orthopedics;  Laterality: Right;   TONSILLECTOMY     and adenoidectomy   TOTAL LAPAROSCOPIC HYSTERECTOMY WITH SALPINGECTOMY Bilateral 07/01/2023   Procedure: HYSTERECTOMY, TOTAL, LAPAROSCOPIC, WITH SALPINGECTOMY;  Surgeon: Jeralyn Crutch, MD;  [facility];  Service: Gynecology;  Laterality: Bilateral;    Family Psychiatric History: Mother PTSD, Father Bipolar disorder and PTSD  Family History:  Family History  Problem Relation Age of Onset   Colon cancer Mother    Hypertension Mother    Arnold-Chiari malformation Mother    Restless legs syndrome Mother    Osteoporosis Mother    COPD Mother    Asthma Mother    Squamous cell carcinoma Mother        skin    Eczema Mother    Arthritis Mother    Peripheral Artery Disease Mother    Anxiety disorder Mother    OCD Mother    Colonic polyp Mother  Stroke Mother    Hypertension Father    Scoliosis Father    Bipolar disorder Father    Congestive Heart Failure Father    COPD Father    Depression Father    Alcohol abuse Maternal Aunt    Depression Maternal Aunt    Pancreatic cancer Maternal Aunt    Alcohol abuse Paternal Aunt    Depression Paternal Aunt    Diabetes Maternal Grandmother    Hypertension Maternal Grandmother    Dementia Maternal Grandmother    OCD Maternal Grandmother    Alzheimer's disease Maternal Grandmother    Bone cancer Maternal Grandfather 45       bone marrow    Multiple myeloma Maternal Grandfather    Alcohol abuse Maternal Grandfather    Drug abuse Maternal Grandfather    Colon polyps Paternal Grandmother    Colon cancer Paternal Grandmother    Cancer Paternal Grandmother        colon   Diabetes Paternal Grandfather    Alcohol abuse Paternal Grandfather    Drug abuse Paternal Grandfather    Dementia  Paternal Grandfather    ADD / ADHD Cousin    Esophageal cancer Neg Hx    Rectal cancer Neg Hx    Stomach cancer Neg Hx     Social History:  Social History   Socioeconomic History   Marital status: Single    Spouse name: Not on file   Number of children: 0   Years of education: college    Highest education level: Not on file  Occupational History   Occupation: Unemployed   Tobacco Use   Smoking status: Never    Passive exposure: Past   Smokeless tobacco: Never  Vaping Use   Vaping status: Never Used  Substance and Sexual Activity   Alcohol use: No   Drug use: No   Sexual activity: Yes    Birth control/protection: I.U.D.  Other Topics Concern   Not on file  Social History Narrative   Lives with mom Sheliah)    Right-handed   Caffeine : 1 cups of coffee per day (rare)   Working not working.  Disabled.    Social Drivers of Health   Financial Resource Strain: Medium Risk (07/28/2023)   Overall Financial Resource Strain (CARDIA)    Difficulty of Paying Living Expenses: Somewhat hard  Food Insecurity: Food Insecurity Present (09/18/2023)   Hunger Vital Sign    Worried About Running Out of Food in the Last Year: Often true    Ran Out of Food in the Last Year: Sometimes true  Transportation Needs: No Transportation Needs (07/28/2023)   PRAPARE - Administrator, Civil Service (Medical): No    Lack of Transportation (Non-Medical): No  Physical Activity: Insufficiently Active (07/28/2023)   Exercise Vital Sign    Days of Exercise per Week: 2 days    Minutes of Exercise per Session: 10 min  Stress: Stress Concern Present (07/28/2023)   [employer] of Occupational Health - Occupational Stress Questionnaire    Feeling of Stress: To some extent  Social Connections: Socially Isolated (07/28/2023)   Social Connection and Isolation Panel    Frequency of Communication with Friends and Family: Once a week    Frequency of Social Gatherings with Friends and Family: Once  a week    Attends Religious Services: Never    Database administrator or Organizations: No    Attends Banker Meetings: Never    Marital Status: Never married  Allergies:  Allergies  Allergen Reactions   Latex Hives and Swelling   Mucinex [Guaifenesin Er] Hives, Itching and Swelling   Other Anaphylaxis and Nausea Only    Pickles - throat swelling and nausea    Penicillins Swelling    Did it involve swelling of the face/tongue/throat, SOB, or low BP? Yes Did it involve sudden or severe rash/hives, skin peeling, or any reaction on the inside of your mouth or nose? No Did you need to seek medical attention at a hospital or doctor's office? Yes When did it last happen?      >10 years ago If all above answers are "NO", may proceed with cephalosporin use.    Contrast Media [Iodinated Contrast Media] Nausea And Vomiting   Multihance  [Gadobenate] Nausea And Vomiting    Metabolic Disorder Labs: Lab Results  Component Value Date   HGBA1C 5.2 06/18/2022   MPG 93.93 04/15/2019   Lab Results  Component Value Date   PROLACTIN 12.2 06/16/2023   PROLACTIN 10.7 06/19/2022   Lab Results  Component Value Date   CHOL 206 (H) 06/16/2023   TRIG 169 (H) 06/16/2023   HDL 35 (L) 06/16/2023   CHOLHDL 5.9 (H) 06/16/2023   VLDL 25.6 06/18/2022   LDLCALC 140 (H) 06/16/2023   LDLCALC 109 (H) 06/18/2022   Lab Results  Component Value Date   TSH 2.37 06/16/2023   TSH 3.01 06/18/2022    Therapeutic Level Labs: No results found for: LITHIUM No results found for: VALPROATE No results found for: CBMZ  Current Medications: Current Outpatient Medications  Medication Sig Dispense Refill   acetaminophen  (TYLENOL ) 500 MG tablet Take 1 tablet (500 mg total) by mouth every 6 (six) hours as needed. 120 tablet 0   albuterol  (VENTOLIN  HFA) 108 (90 Base) MCG/ACT inhaler Inhale 2 puffs into the lungs every 6 (six) hours as needed for wheezing or shortness of breath. 18 g 6    Benzocaine -Resorcinol (VAGISIL EX) Apply 1 application. topically as needed.     busPIRone  (BUSPAR ) 10 MG tablet Take 1 tablet (10 mg total) by mouth 3 (three) times daily. 90 tablet 3   Calcium Carb-Cholecalciferol (CALCIUM 500 + D PO) Take 1 tablet by mouth daily.     colestipol  (COLESTID ) 1 g tablet Take 2 tablets (2 g total) by mouth 3 (three) times daily. 180 tablet 2   diclofenac  Sodium (VOLTAREN ) 1 % GEL Apply 2 g topically 4 (four) times daily. 100 g 2   fluticasone -salmeterol (ADVAIR  HFA) 115-21 MCG/ACT inhaler Inhale 2 puffs into the lungs 2 (two) times daily. 12 g 12   gabapentin  (NEURONTIN ) 300 MG capsule Take 1 capsule (300 mg total) by mouth at bedtime. 90 capsule 1   hydrOXYzine  (ATARAX ) 25 MG tablet Take 1 tablet (25 mg total) by mouth 4 (four) times daily. 120 tablet 3   ibuprofen  (ADVIL ) 800 MG tablet Take 1 tablet (800 mg total) by mouth 3 (three) times daily with meals as needed for headache, moderate pain (pain score 4-6) or cramping. 60 tablet 0   ipratropium-albuterol  (DUONEB) 0.5-2.5 (3) MG/3ML SOLN Take 3 mLs by nebulization every 6 (six) hours as needed. 90 mL 1   loperamide (IMODIUM A-D) 2 MG tablet Take 2 mg by mouth 4 (four) times daily as needed for diarrhea or loose stools.     metoprolol  tartrate (LOPRESSOR ) 25 MG tablet Take 1 tablet (25 mg total) by mouth 2 (two) times daily. 60 tablet 0   metoprolol  tartrate (LOPRESSOR ) 25 MG tablet Take  1 tablet (25 mg total) by mouth 2 (two) times daily. 180 tablet 1   nystatin  (MYCOSTATIN /NYSTOP ) powder Apply 1 Application topically 2 (two) times daily. 15 g 4   omeprazole  (PRILOSEC) 40 MG capsule Take 1 capsule (40 mg total) by mouth in the morning and at bedtime. 180 capsule 1   ondansetron  (ZOFRAN -ODT) 4 MG disintegrating tablet Take 1 tablet (4 mg total) by mouth every 6 (six) hours as needed for nausea. 20 tablet 0   propranolol  (INDERAL ) 10 MG tablet Take 2 tablets (20 mg total) by mouth 3 (three) times daily as needed  (palpitations). 60 tablet 11   QUEtiapine  (SEROQUEL ) 100 MG tablet Take 1 tablet (100 mg total) by mouth at bedtime. 30 tablet 3   sertraline  (ZOLOFT ) 100 MG tablet Take 2 tablets (200 mg total) by mouth at bedtime. 60 tablet 3   sulfamethoxazole -trimethoprim  (BACTRIM  DS) 800-160 MG tablet Take 1 tablet by mouth 2 (two) times daily. 5 tablet 0   SUMAtriptan  (IMITREX ) 50 MG tablet Take 1 tablet at start of headache. May repeat in 2 hours if headache persists or recurs. Max 2 tablets in 24 hours. 10 tablet 3   traZODone  (DESYREL ) 100 MG tablet Take 1 tablet (100 mg total) by mouth at bedtime as needed for sleep. 30 tablet 3   No current facility-administered medications for this visit.     Musculoskeletal: Strength & Muscle Tone: within normal limits and telehealth visit Gait & Station: normal, telehealth visit Patient leans: N/A  Psychiatric Specialty Exam: Review of Systems  All other systems reviewed and are negative.   There were no vitals taken for this visit.There is no height or weight on file to calculate BMI.  General Appearance: Well Groomed  Eye Contact:  Good  Speech:  Clear and Coherent and Normal Rate  Volume:  Normal  Mood:  Euthymic  Affect:  Appropriate and Congruent  Thought Process:  Coherent, Goal Directed and Linear  Orientation:  Full (Time, Place, and Person)  Thought Content: WDL and Logical   Suicidal Thoughts:  No  Homicidal Thoughts:  No  Memory:  Immediate;   Good Recent;   Good Remote;   Good  Judgement:  Good  Insight:  Good  Psychomotor Activity:  Normal  Concentration:  Concentration: Good and Attention Span: Good  Recall:  Good  Fund of Knowledge: Good  Language: Good  Akathisia:  No  Handed:  Right  AIMS (if indicated): Not done  Assets:  Communication Skills Desire for Improvement Financial Resources/Insurance Housing Social Support  ADL's:  Intact  Cognition: WNL  Sleep:  Good   Screenings: AIMS    Flowsheet Row Ad[facility]  (Dis[facility]  AIMS Total Score 0   AUDIT    Flowsheet Row Admission (Discharged) from [facility]  Alcohol Use Disorder Identification Test Final Score (AUDIT) 0   GAD-7    Flowsheet Row Office Visit from 10/02/2023 in [facility] [place] - A Dept Of [place]. [facility] Office Visit from 09/18/2023 in Center for [facility] at [facility] Office Visit from 07/28/2023 in [facility] - A Dept Of [place]. [facility] Office Visit from 06/18/2023 in [facility] at [facility] Video Visit from 06/13/2023 in [facility]  Total GAD-7 Score 15 9 7  7 7   Mini-Mental    Flowsheet Row Office Visit from 07/28/2023 in [facility] - A Dept Of [place]. [facility]  Total Score (max 30 points ) 30   PHQ2-9    Flowsheet Row Office Visit from 10/02/2023 in [facility] - A Dept Of [place]. [facility] Office Visit from 09/18/2023 in Center for [facility] Counselor from 08/28/2023 in [facility] Office Visit from 07/28/2023 in [facility] - A Dept Of [place]. [facility] Office Visit from 06/18/2023 in [facility]  PHQ-2 Total Score 3 4 2 4 2   PHQ-9 Total Score 10 10 5 16 10    Flowsheet Row Admission (Discharged) from 07/01/2023 in [facility]SURG UNIT ED from 05/17/2023 in [facility] Emergency Department at [facility] Video Visit from 03/21/2023 in [facility]  C-SSRS RISK CATEGORY No Risk No Risk Error: Q7 should not be populated when Q6 is No     Assessment and Plan:  Patient notes that she is doing well on her current medication regimen.  No medication changes were made today.  Patient agreeable to continue medications as prescribed  1. Social phobia  Continue- busPIRone  (BUSPAR ) 10 MG tablet; Take 1 tablet (10 mg total) by mouth 3 (three) times daily.  Dispense: 90 tablet; Refill: 3 Continue- hydrOXYzine  (ATARAX ) 25 MG tablet; Take 1 tablet (25 mg total) by mouth 4 (four) times daily.  Dispense: 120 tablet; Refill: 3 Continue- traZODone  (DESYREL ) 100 MG tablet; Take 1 tablet (100 mg total) by mouth at bedtime as needed for sleep.  Dispense: 30 tablet; Refill: 3 Continue- sertraline  (ZOLOFT ) 100 MG tablet; Take 2 tablets (200 mg total) by mouth at bedtime.  Dispense: 60 tablet; Refill: 3  2. Moderate episode of recurrent major depressive disorder (HCC)  Continue- busPIRone  (BUSPAR ) 10 MG tablet; Take 1 tablet (10 mg total) by mouth 3 (three) times daily.  Dispense: 90 tablet; Refill: 3 Continue- traZODone  (DESYREL ) 100 MG tablet; Take 1 tablet (100 mg total) by mouth at bedtime as needed for sleep.  Dispense: 30 tablet; Refill: 3 Continue- sertraline  (ZOLOFT ) 100 MG tablet; Take 2 tablets (200 mg total) by mouth at bedtime.  Dispense: 60 tablet; Refill: 3 Contnue- QUEtiapine  (SEROQUEL ) 100 MG tablet; Take 1 tablet (100 mg total) by mouth at bedtime.  Dispense: 30 tablet; Refill: 3      Follow-up in  3months Follow-up therapy Majel GORMAN Ramp, FNP 10/03/2023, 8:58 AM

## 2023-10-14 ENCOUNTER — Other Ambulatory Visit: Payer: Self-pay

## 2023-10-15 ENCOUNTER — Other Ambulatory Visit: Payer: Self-pay

## 2023-10-16 ENCOUNTER — Institutional Professional Consult (permissible substitution) (INDEPENDENT_AMBULATORY_CARE_PROVIDER_SITE_OTHER): Admitting: Otolaryngology

## 2023-10-17 ENCOUNTER — Ambulatory Visit (HOSPITAL_COMMUNITY): Admitting: Clinical

## 2023-11-01 ENCOUNTER — Encounter: Payer: Self-pay | Admitting: Internal Medicine

## 2023-11-14 ENCOUNTER — Ambulatory Visit (INDEPENDENT_AMBULATORY_CARE_PROVIDER_SITE_OTHER): Admitting: Clinical

## 2023-11-14 DIAGNOSIS — F331 Major depressive disorder, recurrent, moderate: Secondary | ICD-10-CM
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
## 2023-12-02 ENCOUNTER — Ambulatory Visit: Payer: MEDICAID | Admitting: Dermatology

## 2023-12-02 ENCOUNTER — Encounter: Payer: Self-pay | Admitting: Dermatology

## 2023-12-02 DIAGNOSIS — L720 Epidermal cyst: Secondary | ICD-10-CM | POA: Diagnosis not present

## 2023-12-02 DIAGNOSIS — Z1283 Encounter for screening for malignant neoplasm of skin: Secondary | ICD-10-CM | POA: Diagnosis not present

## 2023-12-02 DIAGNOSIS — L578 Other skin changes due to chronic exposure to nonionizing radiation: Secondary | ICD-10-CM

## 2023-12-02 DIAGNOSIS — D229 Melanocytic nevi, unspecified: Secondary | ICD-10-CM

## 2023-12-02 DIAGNOSIS — L814 Other melanin hyperpigmentation: Secondary | ICD-10-CM | POA: Diagnosis not present

## 2023-12-02 DIAGNOSIS — L821 Other seborrheic keratosis: Secondary | ICD-10-CM

## 2023-12-02 DIAGNOSIS — D1801 Hemangioma of skin and subcutaneous tissue: Secondary | ICD-10-CM

## 2023-12-02 DIAGNOSIS — L729 Follicular cyst of the skin and subcutaneous tissue, unspecified: Secondary | ICD-10-CM

## 2023-12-02 NOTE — Progress Notes (Signed)
   Total Body Skin Exam (TBSE) Visit   Subjective  Kristen [name] is a 38 y.o. female who presents for the following: Skin Cancer Screening and Full Body Skin Exam  Patient presents today for follow up visit for TBSE. Patient was last evaluated on 12/02/2022 . Patient denies medication changes. Patient reports she does not have spots, moles and lesions of concern to be evaluated. Patient reports throughout her lifetime she has had minimal sun exposure. Currently, patient reports if she has excessive sun exposure, she does apply sunscreen and/or wears protective coverings. Patient reports she has hx of bx (Both Epidermal Nevus). Patient reports  family history of skin cancers (Mom - Melanoma, Maternal granddad and Aunt). The patient has spots, moles and lesions to be evaluated, some may be new or changing and the patient has concerns that these could be cancer.  The following portions of the chart were reviewed this encounter and updated as appropriate: medications, allergies, medical history  Review of Systems:  No other skin or systemic complaints except as noted in HPI or Assessment and Plan.  Objective  Well appearing patient in no apparent distress; mood and affect are within normal limits.  A full examination was performed including scalp, head, eyes, ears, nose, lips, neck, chest, axillae, abdomen, back, buttocks, bilateral upper extremities, bilateral lower extremities, hands, feet, fingers, toes, fingernails, and toenails. All findings within normal limits unless otherwise noted below.   Relevant physical exam findings are noted in the Assessment and Plan.    Assessment & Plan   LENTIGINES, HEMANGIOMAS - Benign normal skin lesions - Benign-appearing - Call for any changes  MELANOCYTIC NEVI - Tan-brown and/or pink-flesh-colored symmetric macules and papules - Benign appearing on exam today - Observation - Call clinic for new or changing moles - Recommend daily use of  broad spectrum spf 30+ sunscreen to sun-exposed areas.   EPIDERMAL INCLUSION CYST Exam: 1.5 cm Subcutaneous nodule at left mid back  Benign-appearing. Exam most consistent with an epidermal inclusion cyst. Discussed that a cyst is a benign growth that can grow over time and sometimes get irritated or inflamed. Recommend observation if it is not bothersome. Discussed option of surgical excision to remove it if it is growing, symptomatic, or other changes noted. Plan to refer to plastic surgery for removal    SKIN CANCER SCREENING PERFORMED TODAY.    Return in about 1 year (around 12/01/2024) for TBSE.  I, Jetta Ager, am acting as neurosurgeon for [employer], DO.  Documentation: I have reviewed the above documentation for accuracy and completeness, and I agree with the above.  Delon Lenis, DO

## 2023-12-02 NOTE — Patient Instructions (Signed)

## 2023-12-03 ENCOUNTER — Other Ambulatory Visit: Payer: Self-pay

## 2023-12-03 ENCOUNTER — Encounter (INDEPENDENT_AMBULATORY_CARE_PROVIDER_SITE_OTHER): Payer: Self-pay | Admitting: Otolaryngology

## 2023-12-03 ENCOUNTER — Ambulatory Visit (INDEPENDENT_AMBULATORY_CARE_PROVIDER_SITE_OTHER): Admitting: Otolaryngology

## 2023-12-03 VITALS — BP 138/98 | HR 61 | Ht 66.0 in

## 2023-12-03 DIAGNOSIS — J3489 Other specified disorders of nose and nasal sinuses: Secondary | ICD-10-CM | POA: Diagnosis not present

## 2023-12-03 DIAGNOSIS — J343 Hypertrophy of nasal turbinates: Secondary | ICD-10-CM

## 2023-12-03 DIAGNOSIS — R0683 Snoring: Secondary | ICD-10-CM

## 2023-12-03 DIAGNOSIS — J339 Nasal polyp, unspecified: Secondary | ICD-10-CM

## 2023-12-03 DIAGNOSIS — R0981 Nasal congestion: Secondary | ICD-10-CM

## 2023-12-03 DIAGNOSIS — J328 Other chronic sinusitis: Secondary | ICD-10-CM | POA: Diagnosis not present

## 2023-12-03 DIAGNOSIS — G4730 Sleep apnea, unspecified: Secondary | ICD-10-CM

## 2023-12-03 MED ORDER — FLUTICASONE PROPIONATE 50 MCG/ACT NA SUSP
2.0000 | Freq: Two times a day (BID) | NASAL | 6 refills | Status: AC
  Filled 2023-12-03: qty 16, 15d supply, fill #0

## 2023-12-03 MED ORDER — PREDNISONE 10 MG PO TABS
ORAL_TABLET | ORAL | 0 refills | Status: AC
  Filled 2023-12-03: qty 24, 12d supply, fill #0

## 2023-12-03 NOTE — Patient Instructions (Signed)
 Take Prednisone  by mouth (PO) 30mg  x 4 days (3 pills in morning), then 20mg  x4 days (2 pills), then 10mg  x 4 days (1 pill), then stop. Risks discussed Use flonase  two sprays each nostril twice per day

## 2023-12-03 NOTE — Progress Notes (Signed)
 Dear Dr. Meade, Here is my assessment for our mutual patient, Kristen [name]. Thank you for allowing me the opportunity to care for your patient. Please do not hesitate to contact me should you have any other questions. Sincerely, Dr. Eldora Blanch  Otolaryngology Clinic Note Referring provider: Dr. Meade HPI:  Initial visit (11/2023): Discussed the use of AI scribe software for clinical note transcription with the patient, who gave verbal consent to proceed.  History of Present Illness Kristen [name] is a 38 year old female who presents with loud snoring and nasal obstruction  She experiences significant loud snoring, which occurs nightly. She previously used a CPAP machine but discontinued it due to insurance issues and a sleep study that did not indicate severe sleep apnea. She perceives she has a small mouth opening, and reports that she has had to have dental work as a result of this. Does have significant daytime symptoms as well.   She has a history of asthma, with intermittent exacerbations, with ocassional SOB. No h/o pneumonias or neck masses.  She has not been told that she has had nasal polyps but does have nasal obstruction, and frequent sinus infections with discolored drianage. Despite this, she maintains a good sense of smell. She does not use nasal sprays regularly but does when sick.  Patient otherwise denies: - odynophagia, PNA, need for Heimlich, unintentional weight loss - changes in voice, hemoptysis - ear pain, neck masses  ASA sens: no Allergy  testing: no  H&N Surgery: T&A (peds) Personal or FHx of bleeding dz or anesthesia difficulty: no  GLP-1: no AP/AC: no  Tobacco: no  PMHx: Migraine, HTN, POTS, GERD, Ehlers-Danlos(?), Asthma, MDD  Independent Review of Additional Tests or Records:  Dr. Meade (09/25/2023): noted did sleep study, with AHI 0.1 events/hr, and snoring 9.7 events/hr. Snoring wakes her up and wakes up her partner. Dx: Snoring and  PLM; Rx: ref to ENT Labs CBC 10/02/2023 and BMP 06/24/2023: WBC 9.4, Plt 255; BUN/Cr 12/1.02; Eos 400 Sleep study 09/03/2023: AHI 0.8, O2 dair 81%; PLM assoc with arousals 3.6/hr Endo 06/04/2023: - One benign- appearing, intrinsic moderate stenosis was found 33 to 34 cm from the incisors. This stenosis measured 1. 8 cm ( inner diameter) x 1 cm ( in length) . The stenosis was traversed. A TTS dilator was passed through the scope. Dilation with an 18- 19- 20 mm x 8 cm CRE balloon dilator was performed to 20 mm. - LA Grade C ( one or more mucosal breaks continuous between tops of 2 or more mucosal folds, less than 75% circumference) esophagitis with no bleeding was found 30 to 34 cm from the incisors. Biopsies were obtained from the proximal and distal esophagus with cold forceps for histology of suspected eosinophilic esophagitis. - A 3 cm hiatal hernia was present. - The stomach was normal. - The examined duodenum was normal. CTH 06/14/2021: bilateral  chronic sinusitis max and ethmoid and some frontal with some polypoid mucosal thickening and likely polyps PMH/Meds/All/SocHx/FamHx/ROS:   Past Medical History:  Diagnosis Date   Anxiety 2013   treated at [facility] by Darice Molt    Asthma 1989   no hospitalization, or intubation, previously on advair  and singulair . asthma worsening.  Follows w/ Dr. Barnie Louder @ [facility]. Per pt on 01/17/23, she only uses inhaler if she is doing strenous exercise or during the change of seasons.   Chronic diarrhea    Follows w/ [facility].   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   Dysfunctional uterine bleeding    Dysrhythmia 2010   PVC's, palpitations   Ehlers-Danlos syndrome    Follows with Dr. Lauraine Mulch, Sports Medicine and PCP.   Essential hypertension 03/12/2019   Follows w/ [facility] cardiology.   Family history of adverse reaction to anesthesia     Mother woke up during deviated septal surgery   GERD (gastroesophageal reflux disease)    History of hiatal hernia    History of suicidal ideation    IBS (irritable bowel syndrome)    Insulin  resistance    Follows w/ endocrinology.   Kyphosis of thoracic region 2004   painful, runs in dad side    Lymphocytic hypophysitis    Follows w/ endocrinology.   Migraine    Follows w/ PCP.   Orthostatic hypotension    Follows w/ [facility], Rosaline Bane, NP.   Pancreatitis due to biliary obstruction (HCC) 2003   PCOS (polycystic ovarian syndrome) 2014   facial hair, irregular periods, never been pregnant    PTSD (post-traumatic stress disorder)    Follows w/ Behavioral Health.   Sleep apnea    not currently wearing Cpap   Social phobia    Follows w/ Behavioral Health.     Past Surgical History:  Procedure Laterality Date   BIOPSY  06/11/2021   Procedure: BIOPSY;  Surgeon: Shila Gustav GAILS, MD;  Location: [facility];  Service: Gastroenterology;;   BIOPSY  02/25/2023   Procedure: BIOPSY;  Surgeon: Shila Gustav GAILS, MD;  Location: [facility];  Service: Gastroenterology;;   BLADDER SURGERY     bladder stretched as a child   CHOLECYSTECTOMY  2003   COLONOSCOPY WITH PROPOFOL  N/A 06/11/2021   Procedure: COLONOSCOPY WITH PROPOFOL ;  Surgeon: Shila Gustav GAILS, MD;  Location: [facility];  Service: Gastroenterology;  Laterality: N/A;   CYSTOSCOPY N/A 07/01/2023   Procedure: CYSTOSCOPY;  Surgeon: Jeralyn Crutch, MD;  Location: [facility];  Service: Gynecology;  Laterality: N/A;   DILATATION & CURETTAGE/HYSTEROSCOPY WITH MYOSURE N/A 12/14/2019   Procedure: DILATATION & CURETTAGE/HYSTEROSCOPY WITH Polypectomy with MYOSURE;  Surgeon: Herchel Gloris LABOR, MD;  Location: [facility];  Service: Gynecology;  Laterality: N/A;   ESOPHAGOGASTRODUODENOSCOPY (EGD) WITH PROPOFOL  N/A 02/25/2023   Procedure: ESOPHAGOGASTRODUODENOSCOPY (EGD) WITH PROPOFOL ;  Surgeon: Shila Gustav GAILS, MD;   Location: [facility];  Service: Gastroenterology;  Laterality: N/A;   INTRAUTERINE DEVICE (IUD) INSERTION N/A 01/21/2023   Procedure: INTRAUTERINE DEVICE (IUD) INSERTION(MIRENA );  Surgeon: Eveline Lynwood MATSU, MD;  Location: [facility];  Service: Gynecology;  Laterality: N/A;   MOHS SURGERY Left 11/2022   Right side and left leg   OPEN REDUCTION INTERNAL FIXATION (ORIF) PROXIMAL PHALANX Right 07/23/2013   Procedure: OPEN TREATMENT RIGHT RING FINGER, PROXIMAL INTERPHALANGEAL/JOINT FRACTURE DISLOCATION;  Surgeon: Alm LABOR Hummer, MD;  Location: [facility];  Service: Orthopedics;  Laterality: Right;   TONSILLECTOMY     and adenoidectomy   TOTAL LAPAROSCOPIC HYSTERECTOMY WITH SALPINGECTOMY Bilateral 07/01/2023   Procedure: HYSTERECTOMY, TOTAL, LAPAROSCOPIC, WITH SALPINGECTOMY;  Surgeon: Jeralyn Crutch, MD;  Location: [facility];  Service: Gynecology;  Laterality: Bilateral;    Family History  Problem Relation Age of Onset   Colon cancer Mother    Hypertension Mother    Arnold-Chiari malformation Mother    Restless legs syndrome Mother    Osteoporosis Mother    COPD Mother    Asthma  Mother    Squamous cell carcinoma Mother        skin    Eczema Mother    Arthritis Mother    Peripheral Artery Disease Mother    Anxiety disorder Mother    OCD Mother    Colonic polyp Mother    Stroke Mother    Hypertension Father    Scoliosis Father    Bipolar disorder Father    Congestive Heart Failure Father    COPD Father    Depression Father    Alcohol abuse Maternal Aunt    Depression Maternal Aunt    Pancreatic cancer Maternal Aunt    Alcohol abuse Paternal Aunt    Depression Paternal Aunt    Diabetes Maternal Grandmother    Hypertension Maternal Grandmother    Dementia Maternal Grandmother    OCD Maternal Grandmother    Alzheimer's disease Maternal Grandmother    Bone cancer Maternal Grandfather 98       bone marrow    Multiple myeloma Maternal Grandfather     Alcohol abuse Maternal Grandfather    Drug abuse Maternal Grandfather    Colon polyps Paternal Grandmother    Colon cancer Paternal Grandmother    Cancer Paternal Grandmother        colon   Diabetes Paternal Grandfather    Alcohol abuse Paternal Grandfather    Drug abuse Paternal Grandfather    Dementia Paternal Grandfather    ADD / ADHD Cousin    Esophageal cancer Neg Hx    Rectal cancer Neg Hx    Stomach cancer Neg Hx      Social Connections: Socially Isolated (07/28/2023)   Social Connection and Isolation Panel    Frequency of Communication with Friends and Family: Once a week    Frequency of Social Gatherings with Friends and Family: Once a week    Attends Religious Services: Never    Database Administrator or Organizations: No    Attends Banker Meetings: Never    Marital Status: Never married      Current Outpatient Medications:    acetaminophen  (TYLENOL ) 500 MG tablet, Take 1 tablet (500 mg total) by mouth every 6 (six) hours as needed., Disp: 120 tablet, Rfl: 0   albuterol  (VENTOLIN  HFA) 108 (90 Base) MCG/ACT inhaler, Inhale 2 puffs into the lungs every 6 (six) hours as needed for wheezing or shortness of breath., Disp: 18 g, Rfl: 6   Benzocaine -Resorcinol (VAGISIL EX), Apply 1 application. topically as needed., Disp: , Rfl:    busPIRone  (BUSPAR ) 10 MG tablet, Take 1 tablet (10 mg total) by mouth 3 (three) times daily., Disp: 90 tablet, Rfl: 3   Calcium Carb-Cholecalciferol (CALCIUM 500 + D PO), Take 1 tablet by mouth daily., Disp: , Rfl:    colestipol  (COLESTID ) 1 g tablet, Take 2 tablets (2 g total) by mouth 3 (three) times daily., Disp: 180 tablet, Rfl: 2   diclofenac  Sodium (VOLTAREN ) 1 % GEL, Apply 2 g topically 4 (four) times daily., Disp: 100 g, Rfl: 2   fluticasone  (FLONASE ) 50 MCG/ACT nasal spray, Place 2 sprays into both nostrils in the morning and at bedtime., Disp: 16 g, Rfl: 6   fluticasone -salmeterol (ADVAIR  HFA) 115-21 MCG/ACT inhaler, Inhale  2 puffs into the lungs 2 (two) times daily., Disp: 12 g, Rfl: 12   gabapentin  (NEURONTIN ) 300 MG capsule, Take 1 capsule (300 mg total) by mouth at bedtime., Disp: 90 capsule, Rfl: 1   hydrOXYzine  (ATARAX ) 25 MG tablet, Take 1 tablet (  25 mg total) by mouth 4 (four) times daily., Disp: 120 tablet, Rfl: 3   ibuprofen  (ADVIL ) 800 MG tablet, Take 1 tablet (800 mg total) by mouth 3 (three) times daily with meals as needed for headache, moderate pain (pain score 4-6) or cramping., Disp: 60 tablet, Rfl: 0   ipratropium-albuterol  (DUONEB) 0.5-2.5 (3) MG/3ML SOLN, Take 3 mLs by nebulization every 6 (six) hours as needed., Disp: 90 mL, Rfl: 1   loperamide (IMODIUM A-D) 2 MG tablet, Take 2 mg by mouth 4 (four) times daily as needed for diarrhea or loose stools., Disp: , Rfl:    metoprolol  tartrate (LOPRESSOR ) 25 MG tablet, Take 1 tablet (25 mg total) by mouth 2 (two) times daily., Disp: 60 tablet, Rfl: 0   metoprolol  tartrate (LOPRESSOR ) 25 MG tablet, Take 1 tablet (25 mg total) by mouth 2 (two) times daily., Disp: 180 tablet, Rfl: 1   nystatin  (MYCOSTATIN /NYSTOP ) powder, Apply 1 Application topically 2 (two) times daily., Disp: 15 g, Rfl: 4   omeprazole  (PRILOSEC) 40 MG capsule, Take 1 capsule (40 mg total) by mouth in the morning and at bedtime., Disp: 180 capsule, Rfl: 1   ondansetron  (ZOFRAN -ODT) 4 MG disintegrating tablet, Take 1 tablet (4 mg total) by mouth every 6 (six) hours as needed for nausea., Disp: 20 tablet, Rfl: 0   predniSONE  (DELTASONE ) 10 MG tablet, Take 3 tablets (30 mg total) by mouth daily with breakfast for 4 days, THEN 2 tablets (20 mg total) daily with breakfast for 4 days, THEN 1 tablet (10 mg total) daily with breakfast for 4 days., Disp: 24 tablet, Rfl: 0   propranolol  (INDERAL ) 10 MG tablet, Take 2 tablets (20 mg total) by mouth 3 (three) times daily as needed (palpitations)., Disp: 60 tablet, Rfl: 11   QUEtiapine  (SEROQUEL ) 100 MG tablet, Take 1 tablet (100 mg total) by mouth at  bedtime., Disp: 30 tablet, Rfl: 3   sertraline  (ZOLOFT ) 100 MG tablet, Take 2 tablets (200 mg total) by mouth at bedtime., Disp: 60 tablet, Rfl: 3   sulfamethoxazole -trimethoprim  (BACTRIM  DS) 800-160 MG tablet, Take 1 tablet by mouth 2 (two) times daily., Disp: 5 tablet, Rfl: 0   SUMAtriptan  (IMITREX ) 50 MG tablet, Take 1 tablet at start of headache. May repeat in 2 hours if headache persists or recurs. Max 2 tablets in 24 hours., Disp: 10 tablet, Rfl: 3   traZODone  (DESYREL ) 100 MG tablet, Take 1 tablet (100 mg total) by mouth at bedtime as needed for sleep., Disp: 30 tablet, Rfl: 3   Physical Exam:   BP (!) 138/98 (BP Location: Left Arm, Patient Position: Sitting, Cuff Size: Large)   Pulse 61   Ht 5' 6 (1.676 m)   LMP 06/15/2023 (Exact Date)   SpO2 97%   BMI 49.07 kg/m   Salient findings:  CN II-XII intact  Bilateral EAC clear and TM intact with well pneumatized middle ear spaces Anterior rhinoscopy: Septum intact; bilateral inferior turbinates with modest hypertrophy No lesions of oral cavity/oropharynx; narrow mandible arch, does appear to have somewhat small mouth; Tongue friedman 2, tonsils small; Nasal endoscopy was indicated to better evaluate the nose and paranasal sinuses, given the patient's history and exam findings, and is detailed below. No obviously palpable neck masses/lymphadenopathy/thyromegaly No respiratory distress or stridor; TFL was indicated to better evaluate the proximal airway, given the patient's history and exam findings, and is detailed below.  Seprately Identifiable Procedures:  Prior to initiating any procedures, risks/benefits/alternatives were explained to the patient and verbal consent obtained.  PROCEDURE: Bilateral Diagnostic Rigid Nasal Endoscopy Pre-procedure diagnosis: Nasal obstruction, recurrent sinusitis Post-procedure diagnosis: same Indication: See pre-procedure diagnosis and physical exam above Complications: None apparent EBL: 0  mL Anesthesia: Lidocaine  4% and topical decongestant was topically sprayed in each nasal cavity  Description of Procedure:  Patient was identified. A rigid 30 degree endoscope was utilized to evaluate the sinonasal cavities, mucosa, sinus ostia and turbinates and septum.  Overall, signs of mucosal inflammation are noted. No mucopurulence, or masses noted.   Right Middle meatus: obstructed with polyps Right SE Recess: clear Left MM: obstructed with polyps Left SE Recess: clear Photodocumentation was obtained.  CPT CODE -- 68768 - Mod 25  Procedure Note Pre-procedure diagnosis: Sleep disordered breathing, snoring, rule out structural cause Post-procedure diagnosis: Same Procedure: Transnasal Fiberoptic Laryngoscopy, CPT 31575 - Mod 25 Indication: see above Complications: None apparent EBL: 0 mL  The procedure was undertaken to further evaluate the patient's complaint above, with mirror exam inadequate for appropriate examination due to gag reflex and poor patient tolerance  Procedure:  Patient was identified as correct patient. Verbal consent was obtained. The nose was sprayed with oxymetazoline and 4% lidocaine . The The flexible laryngoscope was passed through the nose to view the nasal cavity, pharynx (oropharynx, hypopharynx) and larynx.  The larynx was examined at rest and during multiple phonatory tasks. Documentation was obtained and reviewed with patient. The scope was removed. The patient tolerated the procedure well.  Findings: The nasal cavity and nasopharynx did not reveal any masses or lesions, mucosa appeared to be without obvious lesions. The tongue base, pharyngeal walls, piriform sinuses, vallecula, epiglottis and postcricoid region are normal in appearance; Muller maneuver negative. The visualized portion of the subglottis and proximal trachea is widely patent. The vocal folds are mobile bilaterally. There are no lesions on the free edge of the vocal folds nor elsewhere in the  larynx worrisome for malignancy.    Electronically signed by: Eldora KATHEE Blanch, MD 12/14/2023 1:39 PM    Impression & Plans:  Kristen [name] is a 38 y.o. female with:  1. Snoring   2. Sleep-disordered breathing   3. Nasal congestion   4. Other chronic sinusitis   5. Hypertrophy of both inferior nasal turbinates   6. Nasal polyposis    Multiple issues today: - Snoring/SDB: no significant OSA and TFL reassuring; we discussed differences between primary snoring and OSA. Perhaps nasal polyps contributing given severity but we also discussed multiple different possible etiologies. We discussed sleep surgery possibilities but not guarantee that this would help her snoring, and given PLMs and her medications, makes more sense to treat those than surgery for snoring. Consider trying dental device.  - Nasal polyposis/CRS: in setting of asthma; Interestingly good sense of smell (wonder central compartment?); Will treat with pred burst, flonase . We discussed mgmt for this and she'd like to hold off on CT. Consider FESS for clearance  - f/u 6 weeks  See below regarding exact medications prescribed this encounter including dosages and route: Meds ordered this encounter  Medications   predniSONE  (DELTASONE ) 10 MG tablet    Sig: Take 3 tablets (30 mg total) by mouth daily with breakfast for 4 days, THEN 2 tablets (20 mg total) daily with breakfast for 4 days, THEN 1 tablet (10 mg total) daily with breakfast for 4 days.    Dispense:  24 tablet    Refill:  0   fluticasone  (FLONASE ) 50 MCG/ACT nasal spray    Sig: Place 2 sprays into both nostrils in  the morning and at bedtime.    Dispense:  16 g    Refill:  6      Thank you for allowing me the opportunity to care for your patient. Please do not hesitate to contact me should you have any other questions.  Sincerely, Eldora Blanch, MD Otolaryngologist (ENT), [facility] Phone: [phone] Fax: [phone]  12/14/2023, 1:39 PM    MDM:  Level 4 - 99204 Complexity/Problems addressed: mod - chronic problems, multiple Data complexity: high - independent review of note, labs; independent CT interpretation - Morbidity: mod  - Prescription Drug prescribed or managed: y

## 2023-12-10 ENCOUNTER — Institutional Professional Consult (permissible substitution): Admitting: Physician Assistant

## 2023-12-17 ENCOUNTER — Encounter: Payer: Self-pay | Admitting: Physician Assistant

## 2023-12-17 ENCOUNTER — Ambulatory Visit (INDEPENDENT_AMBULATORY_CARE_PROVIDER_SITE_OTHER): Admitting: Physician Assistant

## 2023-12-17 VITALS — BP 127/85 | HR 66 | Ht 66.0 in | Wt 304.0 lb

## 2023-12-17 DIAGNOSIS — R222 Localized swelling, mass and lump, trunk: Secondary | ICD-10-CM

## 2023-12-17 NOTE — Progress Notes (Signed)
 Referring Provider Vicci Barnie NOVAK, MD [address] [place],  [place] [address]   CC:  Chief Complaint  Patient presents with   consult      Kristen [name] is an 38 y.o. female.  HPI: Patient is a pleasant 38 year old female who presents for evaluation of left mid back mass.  Patient was seen by Dr. Alm, dermatology, 12/02/2023.  At that time, a 1.5 cm subcutaneous nodule noted at the left mid back, benign-appearing and consistent with epidermal inclusion cyst.  She is accompanied by her partner at bedside who reports that periodically her cystic mass will become red, inflamed, and draining a material consistent with sebum.  She states that it is often irritated by her bra strap.  Her medical history includes anxiety and depression, Ehlers-Danlos disease, PCOS, POTS, and obesity.  She is not on any anticoagulation or immunosuppressive medications.  She is neither a diabetic, nor smoker.  Of note, she reports just starting a prednisone  taper for nasal polyps.    Allergies  Allergen Reactions   Latex Hives and Swelling   Mucinex [Guaifenesin Er] Hives, Itching and Swelling   Other Anaphylaxis and Nausea Only    Pickles - throat swelling and nausea    Penicillins Swelling    Did it involve swelling of the face/tongue/throat, SOB, or low BP? Yes Did it involve sudden or severe rash/hives, skin peeling, or any reaction on the inside of your mouth or nose? No Did you need to seek medical attention at a hospital or doctor's office? Yes When did it last happen?      >10 years ago If all above answers are "NO", may proceed with cephalosporin use.    Contrast Media [Iodinated Contrast Media] Nausea And Vomiting   Multihance  [Gadobenate] Nausea And Vomiting    Outpatient Encounter Medications as of 12/17/2023  Medication Sig   albuterol  (VENTOLIN  HFA) 108 (90 Base) MCG/ACT inhaler Inhale 2 puffs into the lungs every 6 (six) hours as needed for wheezing or  shortness of breath.   Benzocaine -Resorcinol (VAGISIL EX) Apply 1 application. topically as needed.   busPIRone  (BUSPAR ) 10 MG tablet Take 1 tablet (10 mg total) by mouth 3 (three) times daily.   Calcium Carb-Cholecalciferol (CALCIUM 500 + D PO) Take 1 tablet by mouth daily.   colestipol  (COLESTID ) 1 g tablet Take 2 tablets (2 g total) by mouth 3 (three) times daily.   fluticasone  (FLONASE ) 50 MCG/ACT nasal spray Place 2 sprays into both nostrils in the morning and at bedtime.   fluticasone -salmeterol (ADVAIR  HFA) 115-21 MCG/ACT inhaler Inhale 2 puffs into the lungs 2 (two) times daily.   gabapentin  (NEURONTIN ) 300 MG capsule Take 1 capsule (300 mg total) by mouth at bedtime.   Hydrocortisone Acetate 2.5 % CREA Apply topically.   hydrOXYzine  (ATARAX ) 25 MG tablet Take 1 tablet (25 mg total) by mouth 4 (four) times daily.   ibuprofen  (ADVIL ) 800 MG tablet Take 1 tablet (800 mg total) by mouth 3 (three) times daily with meals as needed for headache, moderate pain (pain score 4-6) or cramping.   ipratropium-albuterol  (DUONEB) 0.5-2.5 (3) MG/3ML SOLN Take 3 mLs by nebulization every 6 (six) hours as needed.   loperamide (IMODIUM A-D) 2 MG tablet Take 2 mg by mouth 4 (four) times daily as needed for diarrhea or loose stools.   metoprolol  tartrate (LOPRESSOR ) 25 MG tablet Take 1 tablet (25 mg total) by mouth 2 (two) times daily.   nystatin  (MYCOSTATIN /NYSTOP ) powder Apply 1  Application topically 2 (two) times daily.   omeprazole  (PRILOSEC) 40 MG capsule Take 1 capsule (40 mg total) by mouth in the morning and at bedtime.   ondansetron  (ZOFRAN -ODT) 4 MG disintegrating tablet Take 1 tablet (4 mg total) by mouth every 6 (six) hours as needed for nausea.   predniSONE  (DELTASONE ) 10 MG tablet Take 10 mg by mouth daily with breakfast.   propranolol  (INDERAL ) 10 MG tablet Take 2 tablets (20 mg total) by mouth 3 (three) times daily as needed (palpitations).   QUEtiapine  (SEROQUEL ) 100 MG tablet Take 1 tablet  (100 mg total) by mouth at bedtime.   sertraline  (ZOLOFT ) 100 MG tablet Take 2 tablets (200 mg total) by mouth at bedtime.   SUMAtriptan  (IMITREX ) 50 MG tablet Take 1 tablet at start of headache. May repeat in 2 hours if headache persists or recurs. Max 2 tablets in 24 hours.   traZODone  (DESYREL ) 100 MG tablet Take 1 tablet (100 mg total) by mouth at bedtime as needed for sleep.   acetaminophen  (TYLENOL ) 500 MG tablet Take 1 tablet (500 mg total) by mouth every 6 (six) hours as needed. (Patient not taking: Reported on 12/17/2023)   diclofenac  Sodium (VOLTAREN ) 1 % GEL Apply 2 g topically 4 (four) times daily. (Patient not taking: Reported on 12/17/2023)   metoprolol  tartrate (LOPRESSOR ) 25 MG tablet Take 1 tablet (25 mg total) by mouth 2 (two) times daily.   sulfamethoxazole -trimethoprim  (BACTRIM  DS) 800-160 MG tablet Take 1 tablet by mouth 2 (two) times daily.   No facility-administered encounter medications on file as of 12/17/2023.     Past Medical History:  Diagnosis Date   Anxiety 2013   treated at [facility] by Darice Molt    Asthma 1989   no hospitalization, or intubation, previously on advair  and singulair . asthma worsening.  Follows w/ Dr. Barnie Louder @ [facility]. Per pt on 01/17/23, she only uses inhaler if she is doing strenous exercise or during the change of seasons.   Chronic diarrhea    Follows w/ [facility].   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   Dysfunctional uterine bleeding    Dysrhythmia 2010   PVC's, palpitations   Ehlers-Danlos syndrome    Follows with Dr. Lauraine Mulch, Sports Medicine and PCP.   Essential hypertension 03/12/2019   Follows w/ [facility] cardiology.   Family history of adverse reaction to anesthesia    Mother woke up during deviated septal surgery   GERD (gastroesophageal reflux disease)    History of hiatal hernia    History of suicidal  ideation    IBS (irritable bowel syndrome)    Insulin  resistance    Follows w/ endocrinology.   Kyphosis of thoracic region 2004   painful, runs in dad side    Lymphocytic hypophysitis    Follows w/ endocrinology.   Migraine    Follows w/ PCP.   Orthostatic hypotension    Follows w/ [facility], Rosaline Bane, NP.   Pancreatitis due to biliary obstruction (HCC) 2003   PCOS (polycystic ovarian syndrome) 2014   facial hair, irregular periods, never been pregnant    PTSD (post-traumatic stress disorder)    Follows w/ Behavioral Health.   Sleep apnea    not currently wearing Cpap   Social phobia    Follows w/ Behavioral Health.    Past Surgical History:  Procedure Laterality  Date   BIOPSY  06/11/2021   Procedure: BIOPSY;  Surgeon: Shila Gustav GAILS, MD;  Location: [facility];  Service: Gastroenterology;;   BIOPSY  02/25/2023   Procedure: BIOPSY;  Surgeon: Shila Gustav GAILS, MD;  Location: [facility];  Service: Gastroenterology;;   BLADDER SURGERY     bladder stretched as a child   CHOLECYSTECTOMY  2003   COLONOSCOPY WITH PROPOFOL  N/A 06/11/2021   Procedure: COLONOSCOPY WITH PROPOFOL ;  Surgeon: Shila Gustav GAILS, MD;  Location: [facility];  Service: Gastroenterology;  Laterality: N/A;   CYSTOSCOPY N/A 07/01/2023   Procedure: CYSTOSCOPY;  Surgeon: Jeralyn Crutch, MD;  Location: [facility];  Service: Gynecology;  Laterality: N/A;   DILATATION & CURETTAGE/HYSTEROSCOPY WITH MYOSURE N/A 12/14/2019   Procedure: DILATATION & CURETTAGE/HYSTEROSCOPY WITH Polypectomy with MYOSURE;  Surgeon: Herchel Gloris LABOR, MD;  Location: [facility];  Service: Gynecology;  Laterality: N/A;   ESOPHAGOGASTRODUODENOSCOPY (EGD) WITH PROPOFOL  N/A 02/25/2023   Procedure: ESOPHAGOGASTRODUODENOSCOPY (EGD) WITH PROPOFOL ;  Surgeon: Shila Gustav GAILS, MD;  Location: [facility];  Service: Gastroenterology;  Laterality: N/A;   INTRAUTERINE DEVICE (IUD) INSERTION N/A 01/21/2023   Procedure:  INTRAUTERINE DEVICE (IUD) INSERTION(MIRENA );  Surgeon: Eveline Lynwood MATSU, MD;  Location: [facility];  Service: Gynecology;  Laterality: N/A;   MOHS SURGERY Left 11/2022   Right side and left leg   OPEN REDUCTION INTERNAL FIXATION (ORIF) PROXIMAL PHALANX Right 07/23/2013   Procedure: OPEN TREATMENT RIGHT RING FINGER, PROXIMAL INTERPHALANGEAL/JOINT FRACTURE DISLOCATION;  Surgeon: Alm LABOR Hummer, MD;  Location: [facility];  Service: Orthopedics;  Laterality: Right;   TONSILLECTOMY     and adenoidectomy   TOTAL LAPAROSCOPIC HYSTERECTOMY WITH SALPINGECTOMY Bilateral 07/01/2023   Procedure: HYSTERECTOMY, TOTAL, LAPAROSCOPIC, WITH SALPINGECTOMY;  Surgeon: Jeralyn Crutch, MD;  Location: [facility];  Service: Gynecology;  Laterality: Bilateral;    Family History  Problem Relation Age of Onset   Colon cancer Mother    Hypertension Mother    Arnold-Chiari malformation Mother    Restless legs syndrome Mother    Osteoporosis Mother    COPD Mother    Asthma Mother    Squamous cell carcinoma Mother        skin    Eczema Mother    Arthritis Mother    Peripheral Artery Disease Mother    Anxiety disorder Mother    OCD Mother    Colonic polyp Mother    Stroke Mother    Hypertension Father    Scoliosis Father    Bipolar disorder Father    Congestive Heart Failure Father    COPD Father    Depression Father    Alcohol abuse Maternal Aunt    Depression Maternal Aunt    Pancreatic cancer Maternal Aunt    Alcohol abuse Paternal Aunt    Depression Paternal Aunt    Diabetes Maternal Grandmother    Hypertension Maternal Grandmother    Dementia Maternal Grandmother    OCD Maternal Grandmother    Alzheimer's disease Maternal Grandmother    Bone cancer Maternal Grandfather 67       bone marrow    Multiple myeloma Maternal Grandfather    Alcohol abuse Maternal Grandfather    Drug abuse Maternal Grandfather    Colon polyps Paternal Grandmother    Colon cancer Paternal  Grandmother    Cancer Paternal Grandmother        colon   Diabetes Paternal Grandfather    Alcohol abuse Paternal Grandfather    Drug abuse Paternal Grandfather    Dementia  Paternal Grandfather    ADD / ADHD Cousin    Esophageal cancer Neg Hx    Rectal cancer Neg Hx    Stomach cancer Neg Hx     Social History   Social History Narrative   Lives with mom Sheliah)    Right-handed   Caffeine : 1 cups of coffee per day (rare)   Working not working.  Disabled.      Review of Systems General: Denies fevers or chills Skin: Denies any recent drainage or overlying skin changes   Physical Exam    12/17/2023   11:37 AM 12/03/2023    9:37 AM 12/03/2023    9:31 AM  Vitals with BMI  Height 5' 6  5' 6  Weight 304 lbs    BMI 49.09    Systolic 127 138 856  Diastolic 85 98 111  Pulse 66  61    General:  No acute distress, nontoxic appearing  Respiratory: No increased work of breathing Neuro: Alert and oriented Psychiatric: Normal mood and affect  Skin: Nodule left mid back approximately 1 cm x 1 cm on examination today.  No overlying erythema or obvious central punctum.  See picture obtained and placed in chart.    Assessment/Plan  Left mid back cyst: - History and exam consistent with cystic lesion. - Given that it becomes periodically inflamed, reasonable to proceed with definitive management via excision.  She states that she would be amenable to having this done in-office.    - She is currently on a prednisone  taper.  Will want her procedure to be at least 4 weeks out from the conclusion of her systemic steroids. - Discussed risks with the patient and she is agreeable to proceed.  Will send a surgical route and start the insurance authorization process. - Dr. Waddell personally evaluated patient and agrees with assessment and plan.  Honora Seip PA-C 12/17/2023, 12:04 PM

## 2023-12-18 ENCOUNTER — Ambulatory Visit (INDEPENDENT_AMBULATORY_CARE_PROVIDER_SITE_OTHER): Admitting: Clinical

## 2023-12-18 DIAGNOSIS — F331 Major depressive disorder, recurrent, moderate: Secondary | ICD-10-CM | POA: Diagnosis not present
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
## 2024-01-08 ENCOUNTER — Telehealth (HOSPITAL_COMMUNITY): Admitting: Psychiatry

## 2024-01-08 ENCOUNTER — Other Ambulatory Visit: Payer: Self-pay

## 2024-01-08 ENCOUNTER — Encounter (HOSPITAL_COMMUNITY): Payer: Self-pay | Admitting: Psychiatry

## 2024-01-08 DIAGNOSIS — F4010 Social phobia, unspecified: Secondary | ICD-10-CM | POA: Diagnosis not present

## 2024-01-08 DIAGNOSIS — F331 Major depressive disorder, recurrent, moderate: Secondary | ICD-10-CM | POA: Diagnosis not present

## 2024-01-08 MED ORDER — HYDROXYZINE HCL 25 MG PO TABS
25.0000 mg | ORAL_TABLET | Freq: Four times a day (QID) | ORAL | 3 refills | Status: AC
  Filled 2024-01-08: qty 120, 30d supply, fill #0

## 2024-01-08 MED ORDER — SERTRALINE HCL 100 MG PO TABS
200.0000 mg | ORAL_TABLET | Freq: Every day | ORAL | 3 refills | Status: AC
  Filled 2024-01-08: qty 60, 30d supply, fill #0

## 2024-01-08 MED ORDER — BUSPIRONE HCL 10 MG PO TABS
10.0000 mg | ORAL_TABLET | Freq: Three times a day (TID) | ORAL | 3 refills | Status: AC
  Filled 2024-01-08: qty 90, 30d supply, fill #0

## 2024-01-08 MED ORDER — QUETIAPINE FUMARATE 100 MG PO TABS
100.0000 mg | ORAL_TABLET | Freq: Every day | ORAL | 3 refills | Status: AC
  Filled 2024-01-08: qty 30, 30d supply, fill #0

## 2024-01-08 MED ORDER — TRAZODONE HCL 100 MG PO TABS
100.0000 mg | ORAL_TABLET | Freq: Every evening | ORAL | 3 refills | Status: AC | PRN
  Filled 2024-01-08: qty 30, 30d supply, fill #0

## 2024-01-08 NOTE — Progress Notes (Signed)
 BH MD/PA/NP OP Progress Note Virtual Visit via Video Note  I connected with Kristen [name] on 01/08/24 at 11:00 AM EST by a video enabled telemedicine application and verified that I am speaking with the correct person using two identifiers.  Location: Patient: Home Provider: Clinic   I discussed the limitations of evaluation and management by telemedicine and the availability of in person appointments. The patient expressed understanding and agreed to proceed.  I provided 30 minutes of non-face-to-face time during this encounter.      01/08/2024 11:06 AM Kristen [name]  MRN:  [id number]  Chief Complaint: My dad just got out of the hospital  HPI:  38 year old female seen today for follow up psychiatric evaluation.  She has a psychiatric history of social phobia, ADD, anxiety, depression, and PTSD.  She is currently being managed on Seroquel   100 mg nightly, Buspar  10 mg three times daily,  Zoloft  200 mg daily, trazodone  50 mg nightly as needed, hydroxyzine  25 mg 4 times a day as needed, gabapentin  300 mg nightly (prescribed by PCP), and propanolol 20 mg 2 times daily (recives from PCP).  She notes that her medications are effective in managing her psychiatric conditions.   Today patient was well-groomed, pleasant, cooperative, engaged in conversation, and maintained eye contact.  She informed provider that her father just got out of the hospital. She notes that he has Afib and declining health. She reports that a nurses aid was supposed to assist her but notes that they have not come yet. At times she reports this is stressful as he is becoming more difficult for her to handle. She notes that he requires full time care. She notes that he is not eating and has lost 100 pounds. She also notes that he has issues using the restroom and defecates on the floor and walls. She is concerned about her mother who is often tearful about her father. She also notes that her mothers skin  cancer maybe coming back. She notes that she has an abnormal spot on her face and needs to see a dermatologist. She also notes that financially she is not in the best place as her partner lost his job. Patient also notes that her friends 88 year old daughter passed of brain cancer. She is concerned that her father has to many things that her or his mom can't keep up with (cars, guitars, etc.)  Mentally she notes that she feels overwhelmed/numb and does not know what to do. She describes being emotionally drained and tired. Patient notes that the above worsens her anxiety and depression. She reports that she has been more tearful. Today provider conducted a GAD-7 of patient scored a 18.  Provider also conducted PHQ-9 and patient scored an.  Today she endorses passive SI but denies wanting to hurt herself. Today she denies SI/HI/VAH, mania, or paranoia.  She endorses fluctuating sleep and appetite.  Patient notes that her cat and her partner are her support system.   Patient notes that she continues to have issues with her IBS.   Despite the above patient notes that she is able to cope. No medication changes were made today.  Patient agreeable to continue medications as prescribed.  No other concerns noted at this time.      Visit Diagnosis:  No diagnosis found.        Past Psychiatric History: social phobia, ADD, anxiety, depression, and PTSD.  Past Medical History:  Past Medical History:  Diagnosis Date  Anxiety 2013   treated at [facility] by Darice Molt    Asthma 1989   no hospitalization, or intubation, previously on advair  and singulair . asthma worsening.  Follows w/ Dr. Barnie Louder @ [place]. Per pt on 01/17/23, she only uses inhaler if she is doing strenous exercise or during the change of seasons.   Chronic diarrhea    Follows w/ [facility].   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   Dysfunctional uterine bleeding    Dysrhythmia 2010   PVC's, palpitations   Ehlers-Danlos syndrome    Follows with Dr. Lauraine Mulch, Sports Medicine and PCP.   Essential hypertension 03/12/2019   Follows w/ [facility] cardiology.   Family history of adverse reaction to anesthesia    Mother woke up during deviated septal surgery   GERD (gastroesophageal reflux disease)    History of hiatal hernia    History of suicidal ideation    IBS (irritable bowel syndrome)    Insulin  resistance    Follows w/ endocrinology.   Kyphosis of thoracic region 2004   painful, runs in dad side    Lymphocytic hypophysitis    Follows w/ endocrinology.   Migraine    Follows w/ PCP.   Orthostatic hypotension    Follows w/  [facility], Rosaline Bane, NP.   Pancreatitis due to biliary obstruction (HCC) 2003   PCOS (polycystic ovarian syndrome) 2014   facial hair, irregular periods, never been pregnant    PTSD (post-traumatic stress disorder)    Follows w/ Behavioral Health.   Sleep apnea    not currently wearing Cpap   Social phobia    Follows w/ Behavioral Health.    Past Surgical History:  Procedure Laterality Date   BIOPSY  06/11/2021   Procedure: BIOPSY;  Surgeon: Shila Gustav GAILS, MD;  Location: [facility];  Service: Gastroenterology;;   BIOPSY  02/25/2023   Procedure: BIOPSY;  Surgeon: Shila Gustav GAILS, MD;  Location: [facility];  Service: Gastroenterology;;   BLADDER SURGERY     bladder stretched as a child   CHOLECYSTECTOMY  2003   COLONOSCOPY WITH PROPOFOL  N/A 06/11/2021   Procedure: COLONOSCOPY WITH PROPOFOL ;  Surgeon: Shila Gustav GAILS, MD;  Location: [facility];  Service: Gastroenterology;  Laterality: N/A;   CYSTOSCOPY N/A 07/01/2023   Procedure: CYSTOSCOPY;  Surgeon: Jeralyn Crutch, MD;  Location: [facility];  Service: Gynecology;  Laterality: N/A;   DILATATION & CURETTAGE/HYSTEROSCOPY WITH MYOSURE N/A 12/14/2019   Procedure: DILATATION &  CURETTAGE/HYSTEROSCOPY WITH Polypectomy with MYOSURE;  Surgeon: Herchel Gloris LABOR, MD;  Location: [facility];  Service: Gynecology;  Laterality: N/A;   ESOPHAGOGASTRODUODENOSCOPY (EGD) WITH PROPOFOL  N/A 02/25/2023   Procedure: ESOPHAGOGASTRODUODENOSCOPY (EGD) WITH PROPOFOL ;  Surgeon: Shila Gustav GAILS, MD;  Location: [facility];  Service: Gastroenterology;  Laterality: N/A;   INTRAUTERINE DEVICE (IUD) INSERTION N/A 01/21/2023   Procedure: INTRAUTERINE DEVICE (IUD) INSERTION(MIRENA );  Surgeon: Eveline Lynwood MATSU, MD;  Location: [facility];  Service: Gynecology;  Laterality: N/A;   MOHS SURGERY Left 11/2022   Right side and left leg   OPEN REDUCTION INTERNAL FIXATION (ORIF) PROXIMAL PHALANX Right 07/23/2013   Procedure: OPEN TREATMENT RIGHT RING FINGER, PROXIMAL INTERPHALANGEAL/JOINT FRACTURE DISLOCATION;  Surgeon: Alm LABOR Hummer, MD;  Location:  SURGERY CENTER;  Service: Orthopedics;  Laterality: Right;   TONSILLECTOMY     and adenoidectomy   TOTAL LAPAROSCOPIC  HYSTERECTOMY WITH SALPINGECTOMY Bilateral 07/01/2023   Procedure: HYSTERECTOMY, TOTAL, LAPAROSCOPIC, WITH SALPINGECTOMY;  Surgeon: Jeralyn Crutch, MD;  [facility];  Service: Gynecology;  Laterality: Bilateral;    Family Psychiatric History: Mother PTSD, Father Bipolar disorder and PTSD  Family History:  Family History  Problem Relation Age of Onset   Colon cancer Mother    Hypertension Mother    Arnold-Chiari malformation Mother    Restless legs syndrome Mother    Osteoporosis Mother    COPD Mother    Asthma Mother    Squamous cell carcinoma Mother        skin    Eczema Mother    Arthritis Mother    Peripheral Artery Disease Mother    Anxiety disorder Mother    OCD Mother    Colonic polyp Mother    Stroke Mother    Hypertension Father    Scoliosis Father    Bipolar disorder Father    Congestive Heart Failure Father    COPD Father    Depression Father    Alcohol abuse Maternal Aunt     Depression Maternal Aunt    Pancreatic cancer Maternal Aunt    Alcohol abuse Paternal Aunt    Depression Paternal Aunt    Diabetes Maternal Grandmother    Hypertension Maternal Grandmother    Dementia Maternal Grandmother    OCD Maternal Grandmother    Alzheimer's disease Maternal Grandmother    Bone cancer Maternal Grandfather 73       bone marrow    Multiple myeloma Maternal Grandfather    Alcohol abuse Maternal Grandfather    Drug abuse Maternal Grandfather    Colon polyps Paternal Grandmother    Colon cancer Paternal Grandmother    Cancer Paternal Grandmother        colon   Diabetes Paternal Grandfather    Alcohol abuse Paternal Grandfather    Drug abuse Paternal Grandfather    Dementia Paternal Grandfather    ADD / ADHD Cousin    Esophageal cancer Neg Hx    Rectal cancer Neg Hx    Stomach cancer Neg Hx     Social History:  Social History   Socioeconomic History   Marital status: Single    Spouse name: Not on file   Number of children: 0   Years of education: college    Highest education level: Not on file  Occupational History   Occupation: Unemployed   Tobacco Use   Smoking status: Never    Passive exposure: Past   Smokeless tobacco: Never  Vaping Use   Vaping status: Never Used  Substance and Sexual Activity   Alcohol use: No   Drug use: No   Sexual activity: Yes    Birth control/protection: I.U.D.  Other Topics Concern   Not on file  Social History Narrative   Lives with mom Sheliah)    Right-handed   Caffeine : 1 cups of coffee per day (rare)   Working not working.  Disabled.    Social Drivers of Health   Financial Resource Strain: Medium Risk (07/28/2023)   Overall Financial Resource Strain (CARDIA)    Difficulty of Paying Living Expenses: Somewhat hard  Food Insecurity: Food Insecurity Present (09/18/2023)   Hunger Vital Sign    Worried About Running Out of Food in the Last Year: Often true    Ran Out of Food in the Last Year: Sometimes true   Transportation Needs: No Transportation Needs (07/28/2023)   PRAPARE - Transportation    Lack of  Transportation (Medical): No    Lack of Transportation (Non-Medical): No  Physical Activity: Insufficiently Active (07/28/2023)   Exercise Vital Sign    Days of Exercise per Week: 2 days    Minutes of Exercise per Session: 10 min  Stress: Stress Concern Present (07/28/2023)   [employer] of Occupational Health - Occupational Stress Questionnaire    Feeling of Stress: To some extent  Social Connections: Socially Isolated (07/28/2023)   Social Connection and Isolation Panel    Frequency of Communication with Friends and Family: Once a week    Frequency of Social Gatherings with Friends and Family: Once a week    Attends Religious Services: Never    Database Administrator or Organizations: No    Attends Banker Meetings: Never    Marital Status: Never married    Allergies:  Allergies  Allergen Reactions   Latex Hives and Swelling   Mucinex [Guaifenesin Er] Hives, Itching and Swelling   Other Anaphylaxis and Nausea Only    Pickles - throat swelling and nausea    Penicillins Swelling    Did it involve swelling of the face/tongue/throat, SOB, or low BP? Yes Did it involve sudden or severe rash/hives, skin peeling, or any reaction on the inside of your mouth or nose? No Did you need to seek medical attention at a hospital or doctor's office? Yes When did it last happen?      >10 years ago If all above answers are "NO", may proceed with cephalosporin use.    Contrast Media [Iodinated Contrast Media] Nausea And Vomiting   Multihance  [Gadobenate] Nausea And Vomiting    Metabolic Disorder Labs: Lab Results  Component Value Date   HGBA1C 5.2 06/18/2022   MPG 93.93 04/15/2019   Lab Results  Component Value Date   PROLACTIN 12.2 06/16/2023   PROLACTIN 10.7 06/19/2022   Lab Results  Component Value Date   CHOL 206 (H) 06/16/2023   TRIG 169 (H) 06/16/2023   HDL 35  (L) 06/16/2023   CHOLHDL 5.9 (H) 06/16/2023   VLDL 25.6 06/18/2022   LDLCALC 140 (H) 06/16/2023   LDLCALC 109 (H) 06/18/2022   Lab Results  Component Value Date   TSH 2.37 06/16/2023   TSH 3.01 06/18/2022    Therapeutic Level Labs: No results found for: LITHIUM No results found for: VALPROATE No results found for: CBMZ  Current Medications: Current Outpatient Medications  Medication Sig Dispense Refill   acetaminophen  (TYLENOL ) 500 MG tablet Take 1 tablet (500 mg total) by mouth every 6 (six) hours as needed. (Patient not taking: Reported on 12/17/2023) 120 tablet 0   albuterol  (VENTOLIN  HFA) 108 (90 Base) MCG/ACT inhaler Inhale 2 puffs into the lungs every 6 (six) hours as needed for wheezing or shortness of breath. 18 g 6   Benzocaine -Resorcinol (VAGISIL EX) Apply 1 application. topically as needed.     busPIRone  (BUSPAR ) 10 MG tablet Take 1 tablet (10 mg total) by mouth 3 (three) times daily. 90 tablet 3   Calcium Carb-Cholecalciferol (CALCIUM 500 + D PO) Take 1 tablet by mouth daily.     colestipol  (COLESTID ) 1 g tablet Take 2 tablets (2 g total) by mouth 3 (three) times daily. 180 tablet 2   diclofenac  Sodium (VOLTAREN ) 1 % GEL Apply 2 g topically 4 (four) times daily. (Patient not taking: Reported on 12/17/2023) 100 g 2   fluticasone  (FLONASE ) 50 MCG/ACT nasal spray Place 2 sprays into both nostrils in the morning and at bedtime. 16 g  6   fluticasone -salmeterol (ADVAIR  HFA) 115-21 MCG/ACT inhaler Inhale 2 puffs into the lungs 2 (two) times daily. 12 g 12   gabapentin  (NEURONTIN ) 300 MG capsule Take 1 capsule (300 mg total) by mouth at bedtime. 90 capsule 1   Hydrocortisone Acetate 2.5 % CREA Apply topically.     hydrOXYzine  (ATARAX ) 25 MG tablet Take 1 tablet (25 mg total) by mouth 4 (four) times daily. 120 tablet 3   ibuprofen  (ADVIL ) 800 MG tablet Take 1 tablet (800 mg total) by mouth 3 (three) times daily with meals as needed for headache, moderate pain (pain score 4-6)  or cramping. 60 tablet 0   ipratropium-albuterol  (DUONEB) 0.5-2.5 (3) MG/3ML SOLN Take 3 mLs by nebulization every 6 (six) hours as needed. 90 mL 1   loperamide (IMODIUM A-D) 2 MG tablet Take 2 mg by mouth 4 (four) times daily as needed for diarrhea or loose stools.     metoprolol  tartrate (LOPRESSOR ) 25 MG tablet Take 1 tablet (25 mg total) by mouth 2 (two) times daily. 60 tablet 0   metoprolol  tartrate (LOPRESSOR ) 25 MG tablet Take 1 tablet (25 mg total) by mouth 2 (two) times daily. 180 tablet 1   nystatin  (MYCOSTATIN /NYSTOP ) powder Apply 1 Application topically 2 (two) times daily. 15 g 4   omeprazole  (PRILOSEC) 40 MG capsule Take 1 capsule (40 mg total) by mouth in the morning and at bedtime. 180 capsule 1   ondansetron  (ZOFRAN -ODT) 4 MG disintegrating tablet Take 1 tablet (4 mg total) by mouth every 6 (six) hours as needed for nausea. 20 tablet 0   predniSONE  (DELTASONE ) 10 MG tablet Take 10 mg by mouth daily with breakfast.     propranolol  (INDERAL ) 10 MG tablet Take 2 tablets (20 mg total) by mouth 3 (three) times daily as needed (palpitations). 60 tablet 11   QUEtiapine  (SEROQUEL ) 100 MG tablet Take 1 tablet (100 mg total) by mouth at bedtime. 30 tablet 3   sertraline  (ZOLOFT ) 100 MG tablet Take 2 tablets (200 mg total) by mouth at bedtime. 60 tablet 3   sulfamethoxazole -trimethoprim  (BACTRIM  DS) 800-160 MG tablet Take 1 tablet by mouth 2 (two) times daily. 5 tablet 0   SUMAtriptan  (IMITREX ) 50 MG tablet Take 1 tablet at start of headache. May repeat in 2 hours if headache persists or recurs. Max 2 tablets in 24 hours. 10 tablet 3   traZODone  (DESYREL ) 100 MG tablet Take 1 tablet (100 mg total) by mouth at bedtime as needed for sleep. 30 tablet 3   No current facility-administered medications for this visit.     Musculoskeletal: Strength & Muscle Tone: within normal limits and telehealth visit Gait & Station: normal, telehealth visit Patient leans: N/A  Psychiatric Specialty  Exam: Review of Systems  Last menstrual period 06/15/2023.There is no height or weight on file to calculate BMI.  General Appearance: Well Groomed  Eye Contact:  Good  Speech:  Clear and Coherent and Normal Rate  Volume:  Normal  Mood:  Anxious and Depressed  Affect:  Appropriate and Congruent  Thought Process:  Coherent, Goal Directed and Linear  Orientation:  Full (Time, Place, and Person)  Thought Content: WDL and Logical   Suicidal Thoughts:  No  Homicidal Thoughts:  No  Memory:  Immediate;   Good Recent;   Good Remote;   Good  Judgement:  Good  Insight:  Good  Psychomotor Activity:  Normal  Concentration:  Concentration: Good and Attention Span: Good  Recall:  Good  Fund of  Knowledge: Good  Language: Good  Akathisia:  No  Handed:  Right  AIMS (if indicated): Not done  Assets:  Communication Skills Desire for Improvement Financial Resources/Insurance Housing Social Support  ADL's:  Intact  Cognition: WNL  Sleep:  Good   Screenings: AIMS    Flowsheet Row [facility]Dis[facility]  AIMS Total Score 0   AUDIT    Flowsheet Row Admission (Discharged) from [facility]  Alcohol Use Disorder Identification Test Final Score (AUDIT) 0   GAD-7    Flowsheet Row Office Visit from 10/02/2023 in [facility] [place] - A Dept Of [place] Office Visit from 09/18/2023 in [facility] Office Visit from 07/28/2023 in [facility] [place] - A Dept Of [place]. [facility] Office Visit from 06/18/2023 in [facility] Video Visit from 06/13/2023 in [facility]  Total GAD-7 Score 15 9 7 7 7    Mini-Mental    Flowsheet Row Office Visit from 07/28/2023 in [facility]  [facility] [place] - A Dept Of [place]. [facility]  Total Score (max 30 points ) 30   PHQ2-9    Flowsheet Row Office Visit from 10/02/2023 in [facility] [place] - A Dept Of [place]. [facility] Office Visit from 09/18/2023 in [facility] for [facility] at [facility] Counselor from 08/28/2023 in [facility] Office Visit from 07/28/2023 in [facility] [place] - A Dept Of [place]. [facility] Office Visit from 06/18/2023 in [facility] at [facility]  PHQ-2 Total Score 3 4 2 4 2   PHQ-9 Total Score 10 10 5 16 10    Flowsheet Row Admission (Discharged) from 07/01/2023 in [facility] GENER[facility] MED[facility]SURG UNIT ED from 05/17/2023 in [facility] Video Visit from 03/21/2023 in [facility]  C-SSRS RISK CATEGORY No Risk No Risk Error: Q7 should not be populated when Q6 is No     Assessment and Plan: Patient notes that she is feels numb and tired due to situational stressor. At this time she does not want to change medications.  No medication changes made today.  Patient agreeable to continue medications as prescribed  1. Social phobia  Continue- busPIRone  (BUSPAR ) 10 MG tablet; Take 1 tablet (10 mg total) by mouth 3 (three) times daily.  Dispense: 90 tablet; Refill: 3 Continue- traZODone  (DESYREL ) 100 MG tablet; Take 1 tablet (100 mg total) by mouth at bedtime as needed for sleep.  Dispense: 30 tablet; Refill: 3 Continue- sertraline  (ZOLOFT ) 100 MG tablet; Take 2 tablets (200 mg total) by mouth at bedtime.  Dispense: 60 tablet; Refill: 3 Continue- hydrOXYzine  (ATARAX ) 25 MG tablet; Take 1 tablet (25 mg total) by mouth 4 (four) times daily.  Dispense: 120 tablet; Refill: 3  2. Moderate episode of recurrent major depressive disorder (HCC)  Continue- busPIRone  (BUSPAR ) 10 MG tablet; Take 1 tablet  (10 mg total) by mouth 3 (three) times daily.  Dispense: 90 tablet; Refill: 3 Continue- traZODone  (DESYREL ) 100 MG tablet; Take 1 tablet (100 mg total) by mouth at bedtime as needed for sleep.  Dispense: 30 tablet; Refill: 3 Continue-  QUEtiapine  (SEROQUEL ) 100 MG tablet; Take 1 tablet (100 mg total) by mouth at bedtime.  Dispense: 30 tablet; Refill: 3 Continue- sertraline  (ZOLOFT ) 100 MG tablet; Take 2 tablets (200 mg total) by mouth at bedtime.  Dispense: 60 tablet; Refill: 3       Follow-up in  3months Follow-up therapy Zane FORBES Bach, NP 01/08/2024, 11:06 AM

## 2024-01-09 ENCOUNTER — Ambulatory Visit (INDEPENDENT_AMBULATORY_CARE_PROVIDER_SITE_OTHER): Admitting: Clinical

## 2024-01-14 ENCOUNTER — Other Ambulatory Visit: Payer: Self-pay

## 2024-01-14 ENCOUNTER — Other Ambulatory Visit: Payer: Self-pay | Admitting: Internal Medicine

## 2024-01-14 DIAGNOSIS — I10 Essential (primary) hypertension: Secondary | ICD-10-CM

## 2024-01-14 MED ORDER — METOPROLOL TARTRATE 25 MG PO TABS
25.0000 mg | ORAL_TABLET | Freq: Two times a day (BID) | ORAL | 0 refills | Status: AC
  Filled 2024-01-14: qty 60, 30d supply, fill #0

## 2024-01-15 ENCOUNTER — Ambulatory Visit (INDEPENDENT_AMBULATORY_CARE_PROVIDER_SITE_OTHER): Admitting: Otolaryngology

## 2024-01-18 NOTE — Progress Notes (Signed)
 THERAPIST PROGRESS NOTE Virtual Visit via Video Note  I connected with Kristen [name] on 01/09/2024 at 11:00 AM EST by a video enabled telemedicine application and verified that I am speaking with the correct person using two identifiers.  Location: Patient: home Provider: office   I discussed the limitations of evaluation and management by telemedicine and the availability of in person appointments. The patient expressed understanding and agreed to proceed.   Follow Up Instructions: I discussed the assessment and treatment plan with the patient. The patient was provided an opportunity to ask questions and all were answered. The patient agreed with the plan and demonstrated an understanding of the instructions.   The patient was advised to call back or seek an in-person evaluation if the symptoms worsen or if the condition fails to improve as anticipated.    Session Time: 40 min  Participation Level: Active  Behavioral Response: CasualAlertDepressed  Type of Therapy: Individual Therapy  Treatment Goals addressed: client will engage in at least 80% of scheduled individual psychotherapy sessions  ProgressTowards Goals: Progressing  Interventions: CBT and Supportive  Summary:  Kristen [name] is a 38 y.o. female who presents for the scheduled appointment oriented times five, appropriately dressed and friendly. Client denied hallucinations and delusions. Client reported she has a lot of her mind today. Client reported her dad went back in the hospital but he is back home. Client reported she is not sure of describing it in such a way but sees her dad in a slow dying process. Client reported he has multiple health conditions which have been hard to manage. Client reported her father is completely dependent on others for care. Client reported her dad is at the point where he should be in nursing care but he does not want to go. Client reported even speaking to his  doctors about they say if he is in his right mind he doesn't have to go. Client reported his personality overall is difficult to cope with. Client reported a lot of her thoughts and emotions relate to feeing overwhelmed by both of her parents. Client reported sometimes she can get away to her fiances. Client reported she has a friend who doesn't live too far away but has a lot going on. Client reported she just tries to keep herself grounded. Evidence of progress towards goal:  client reported 1 positive of being able to have some boundaries at home.  Suicidal/Homicidal: Nowithout intent/plan  Therapist Response:  Therapist began the appointment asking how she has been doing. Therapist engaged with active listening and positive emotional support. Therapist used cbt to engage and give her time to discuss her thoughts and feelings. Therapist used cbt to engage and normalize her thoughts and feelings. Therapist used cbt to teach the client about how to utilize boundaries with communication. Therapist used CBT ask the client to identify her progress with frequency of use with coping skills with continued practice in her daily activity.      Plan: Return again in 3 weeks.  Diagnosis: moderate recurrent major depression  Collaboration of Care: Patient refused AEB none requested by the client.  Patient/Guardian was advised Release of Information must be obtained prior to any record release in order to collaborate their care with an outside provider. Patient/Guardian was advised if they have not already done so to contact the registration department to sign all necessary forms in order for us  to release information regarding their care.   Consent: Patient/Guardian gives verbal consent for treatment  and assignment of benefits for services provided during this visit. Patient/Guardian expressed understanding and agreed to proceed.   Keelynn Furgerson Y Duvid Smalls, LCSW 01/09/2024

## 2024-01-20 ENCOUNTER — Other Ambulatory Visit: Payer: Self-pay

## 2024-01-20 ENCOUNTER — Encounter (INDEPENDENT_AMBULATORY_CARE_PROVIDER_SITE_OTHER): Payer: Self-pay | Admitting: Otolaryngology

## 2024-01-20 ENCOUNTER — Ambulatory Visit (INDEPENDENT_AMBULATORY_CARE_PROVIDER_SITE_OTHER): Admitting: Otolaryngology

## 2024-01-20 VITALS — BP 143/99 | HR 66 | Ht 66.0 in | Wt 304.0 lb

## 2024-01-20 DIAGNOSIS — R0981 Nasal congestion: Secondary | ICD-10-CM

## 2024-01-20 DIAGNOSIS — J343 Hypertrophy of nasal turbinates: Secondary | ICD-10-CM

## 2024-01-20 DIAGNOSIS — R0683 Snoring: Secondary | ICD-10-CM

## 2024-01-20 DIAGNOSIS — J339 Nasal polyp, unspecified: Secondary | ICD-10-CM

## 2024-01-20 DIAGNOSIS — G4730 Sleep apnea, unspecified: Secondary | ICD-10-CM

## 2024-01-20 DIAGNOSIS — J328 Other chronic sinusitis: Secondary | ICD-10-CM

## 2024-01-20 MED ORDER — PREDNISOLONE ACETATE 1 % OP SUSP
OPHTHALMIC | 5 refills | Status: AC
  Filled 2024-01-20: qty 10, 33d supply, fill #0

## 2024-01-20 NOTE — Progress Notes (Signed)
 Dear Dr. Vicci, Here is my assessment for our mutual patient, Kristen [name]. Thank you for allowing me the opportunity to care for your patient. Please do not hesitate to contact me should you have any other questions. Sincerely, Dr. Eldora Blanch  Otolaryngology Clinic Note Referring provider: Dr. Vicci HPI:  Initial visit (11/2023): Discussed the use of AI scribe software for clinical note transcription with the patient, who gave verbal consent to proceed.  History of Present Illness Kristen [name] is a 38 year old female who presents with loud snoring and nasal obstruction  She experiences significant loud snoring, which occurs nightly. She previously used a CPAP machine but discontinued it due to insurance issues and a sleep study that did not indicate severe sleep apnea. She perceives she has a small mouth opening, and reports that she has had to have dental work as a result of this. Does have significant daytime symptoms as well.   She has a history of asthma, with intermittent exacerbations, with ocassional SOB. No h/o pneumonias or neck masses.  She has not been told that she has had nasal polyps but does have nasal obstruction, and frequent sinus infections with discolored drianage. Despite this, she maintains a good sense of smell. She does not use nasal sprays regularly but does when sick.  Patient otherwise denies: - odynophagia, PNA, need for Heimlich, unintentional weight loss - changes in voice, hemoptysis - ear pain, neck masses  ASA sens: no Allergy  testing: no  --------------------------------------------------------- 01/20/2024 Using flonase , she does have some dripping, continues to have some congestion. Feels like pred helped with nasal breathing. No issues with discolored drainage; no issues with smell; still snoring. She has seen sleep  in the past but notes she does not get great sleep.  She does have fairly significant asthma for which she sees  pulm  H&N Surgery: T&A (peds) Personal or FHx of bleeding dz or anesthesia difficulty: no  GLP-1: no AP/AC: no  Tobacco: no  PMHx: Migraine, HTN, POTS, GERD, Ehlers-Danlos(?), Asthma, MDD  Independent Review of Additional Tests or Records:  Dr. Meade (09/25/2023): noted did sleep study, with AHI 0.1 events/hr, and snoring 9.7 events/hr. Snoring wakes her up and wakes up her partner. Dx: Snoring and PLM; Rx: ref to ENT Labs CBC 10/02/2023 and BMP 06/24/2023: WBC 9.4, Plt 255; BUN/Cr 12/1.02; Eos 400 Sleep study 09/03/2023: AHI 0.8, O2 dair 81%; PLM assoc with arousals 3.6/hr Endo 06/04/2023: - One benign- appearing, intrinsic moderate stenosis was found 33 to 34 cm from the incisors. This stenosis measured 1. 8 cm ( inner diameter) x 1 cm ( in length) . The stenosis was traversed. A TTS dilator was passed through the scope. Dilation with an 18- 19- 20 mm x 8 cm CRE balloon dilator was performed to 20 mm. - LA Grade C ( one or more mucosal breaks continuous between tops of 2 or more mucosal folds, less than 75% circumference) esophagitis with no bleeding was found 30 to 34 cm from the incisors. Biopsies were obtained from the proximal and distal esophagus with cold forceps for histology of suspected eosinophilic esophagitis. - A 3 cm hiatal hernia was present. - The stomach was normal. - The examined duodenum was normal. Dr. Laurence notes reviewed [facility] 06/14/2021: bilateral  chronic sinusitis max and ethmoid and some frontal with some polypoid mucosal thickening and likely polyps PMH/Meds/All/SocHx/FamHx/ROS:   Past Medical History:  Diagnosis Date   Anxiety 2013   treated at [facility] by Darice Molt  Asthma 1989   no hospitalization, or intubation, previously on advair  and singulair . asthma worsening.  Follows w/ Dr. Barnie Louder @ [facility]. Per pt on 01/17/23, she only uses inhaler if she is doing strenous exercise or during the change of seasons.   Chronic diarrhea    Follows w/   GI.   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   Dysfunctional uterine bleeding    Dysrhythmia 2010   PVC's, palpitations   Ehlers-Danlos syndrome    Follows with Dr. Lauraine Mulch, Sports Medicine and PCP.   Essential hypertension 03/12/2019   Follows w/ [facility] cardiology.   Family history of adverse reaction to anesthesia    Mother woke up during deviated septal surgery   GERD (gastroesophageal reflux disease)    History of hiatal hernia    History of suicidal ideation    IBS (irritable bowel syndrome)    Insulin  resistance    Follows w/ endocrinology.   Kyphosis of thoracic region 2004   painful, runs in dad side    Lymphocytic hypophysitis    Follows w/ endocrinology.   Migraine    Follows w/ PCP.   Orthostatic hypotension    Follows w/ [facility], Rosaline Bane, NP.   Pancreatitis due to biliary obstruction (HCC) 2003   PCOS (polycystic ovarian syndrome) 2014   facial hair, irregular periods, never been pregnant    PTSD (post-traumatic stress disorder)    Follows w/ Behavioral Health.   Sleep apnea    not currently wearing Cpap   Social phobia    Follows w/ Behavioral Health.     Past Surgical History:  Procedure Laterality Date   BIOPSY  06/11/2021   Procedure: BIOPSY;  Surgeon: Shila Gustav GAILS, MD;  Location: [facility];  Service: Gastroenterology;;   BIOPSY  02/25/2023   Procedure: BIOPSY;  Surgeon: Shila Gustav GAILS, MD;  Location: [facility];  Service: Gastroenterology;;   BLADDER SURGERY     bladder stretched as a child   CHOLECYSTECTOMY  2003   COLONOSCOPY WITH PROPOFOL  N/A 06/11/2021   Procedure: COLONOSCOPY WITH PROPOFOL ;  Surgeon: Shila Gustav GAILS, MD;  Location: [facility];  Service: Gastroenterology;  Laterality: N/A;   CYSTOSCOPY N/A 07/01/2023   Procedure: CYSTOSCOPY;  Surgeon: Jeralyn Crutch, MD;  Location: [facility];   Service: Gynecology;  Laterality: N/A;   DILATATION & CURETTAGE/HYSTEROSCOPY WITH MYOSURE N/A 12/14/2019   Procedure: DILATATION & CURETTAGE/HYSTEROSCOPY WITH Polypectomy with MYOSURE;  Surgeon: Herchel Gloris LABOR, MD;  Location: [facility];  Service: Gynecology;  Laterality: N/A;   ESOPHAGOGASTRODUODENOSCOPY (EGD) WITH PROPOFOL  N/A 02/25/2023   Procedure: ESOPHAGOGASTRODUODENOSCOPY (EGD) WITH PROPOFOL ;  Surgeon: Shila Gustav GAILS, MD;  Location: [facility];  Service: Gastroenterology;  Laterality: N/A;   INTRAUTERINE DEVICE (IUD) INSERTION N/A 01/21/2023   Procedure: INTRAUTERINE DEVICE (IUD) INSERTION(MIRENA );  Surgeon: Eveline Lynwood MATSU, MD;  Location[facility];  Service: Gynecology;  Laterality: N/A;   MOHS SURGERY Left 11/2022   Right side and left leg   OPEN REDUCTION INTERNAL FIXATION (ORIF) PROXIMAL PHALANX Right 07/23/2013   Procedure: OPEN TREATMENT RIGHT RING FINGER, PROXIMAL INTERPHALANGEAL/JOINT FRACTURE DISLOCATION;  Surgeon: Alm LABOR Hummer, MD;  Location: [facility];  Service: Orthopedics;  Laterality: Right;   TONSILLECTOMY     and adenoidectomy   TOTAL LAPAROSCOPIC HYSTERECTOMY WITH SALPINGECTOMY Bilateral 07/01/2023   Procedure: HYSTERECTOMY, TOTAL, LAPAROSCOPIC, WITH  SALPINGECTOMY;  Surgeon: Jeralyn Crutch, MD;  Location: [facility];  Service: Gynecology;  Laterality: Bilateral;    Family History  Problem Relation Age of Onset   Colon cancer Mother    Hypertension Mother    Arnold-Chiari malformation Mother    Restless legs syndrome Mother    Osteoporosis Mother    COPD Mother    Asthma Mother    Squamous cell carcinoma Mother        skin    Eczema Mother    Arthritis Mother    Peripheral Artery Disease Mother    Anxiety disorder Mother    OCD Mother    Colonic polyp Mother    Stroke Mother    Hypertension Father    Scoliosis Father    Bipolar disorder Father    Congestive Heart Failure Father    COPD Father    Depression Father     Alcohol abuse Maternal Aunt    Depression Maternal Aunt    Pancreatic cancer Maternal Aunt    Alcohol abuse Paternal Aunt    Depression Paternal Aunt    Diabetes Maternal Grandmother    Hypertension Maternal Grandmother    Dementia Maternal Grandmother    OCD Maternal Grandmother    Alzheimer's disease Maternal Grandmother    Bone cancer Maternal Grandfather 70       bone marrow    Multiple myeloma Maternal Grandfather    Alcohol abuse Maternal Grandfather    Drug abuse Maternal Grandfather    Colon polyps Paternal Grandmother    Colon cancer Paternal Grandmother    Cancer Paternal Grandmother        colon   Diabetes Paternal Grandfather    Alcohol abuse Paternal Grandfather    Drug abuse Paternal Grandfather    Dementia Paternal Grandfather    ADD / ADHD Cousin    Esophageal cancer Neg Hx    Rectal cancer Neg Hx    Stomach cancer Neg Hx      Social Connections: Socially Isolated (07/28/2023)   Social Connection and Isolation Panel    Frequency of Communication with Friends and Family: Once a week    Frequency of Social Gatherings with Friends and Family: Once a week    Attends Religious Services: Never    Database Administrator or Organizations: No    Attends Engineer, Structural: Never    Marital Status: Never married      Current Outpatient Medications:    albuterol  (VENTOLIN  HFA) 108 (90 Base) MCG/ACT inhaler, Inhale 2 puffs into the lungs every 6 (six) hours as needed for wheezing or shortness of breath., Disp: 18 g, Rfl: 6   Benzocaine -Resorcinol (VAGISIL EX), Apply 1 application. topically as needed., Disp: , Rfl:    busPIRone  (BUSPAR ) 10 MG tablet, Take 1 tablet (10 mg total) by mouth 3 (three) times daily., Disp: 90 tablet, Rfl: 3   Calcium Carb-Cholecalciferol (CALCIUM 500 + D PO), Take 1 tablet by mouth daily., Disp: , Rfl:    colestipol  (COLESTID ) 1 g tablet, Take 2 tablets (2 g total) by mouth 3 (three) times daily., Disp: 180 tablet, Rfl: 2    fluticasone  (FLONASE ) 50 MCG/ACT nasal spray, Place 2 sprays into both nostrils in the morning and at bedtime., Disp: 16 g, Rfl: 6   fluticasone -salmeterol (ADVAIR  HFA) 115-21 MCG/ACT inhaler, Inhale 2 puffs into the lungs 2 (two) times daily., Disp: 12 g, Rfl: 12   gabapentin  (NEURONTIN ) 300 MG capsule, Take 1 capsule (300 mg total) by mouth  at bedtime., Disp: 90 capsule, Rfl: 1   Hydrocortisone Acetate 2.5 % CREA, Apply topically., Disp: , Rfl:    hydrOXYzine  (ATARAX ) 25 MG tablet, Take 1 tablet (25 mg total) by mouth 4 (four) times daily., Disp: 120 tablet, Rfl: 3   ibuprofen  (ADVIL ) 800 MG tablet, Take 1 tablet (800 mg total) by mouth 3 (three) times daily with meals as needed for headache, moderate pain (pain score 4-6) or cramping., Disp: 60 tablet, Rfl: 0   ipratropium-albuterol  (DUONEB) 0.5-2.5 (3) MG/3ML SOLN, Take 3 mLs by nebulization every 6 (six) hours as needed., Disp: 90 mL, Rfl: 1   loperamide (IMODIUM A-D) 2 MG tablet, Take 2 mg by mouth 4 (four) times daily as needed for diarrhea or loose stools., Disp: , Rfl:    metoprolol  tartrate (LOPRESSOR ) 25 MG tablet, Take 1 tablet (25 mg total) by mouth 2 (two) times daily., Disp: 60 tablet, Rfl: 0   nystatin  (MYCOSTATIN /NYSTOP ) powder, Apply 1 Application topically 2 (two) times daily., Disp: 15 g, Rfl: 4   omeprazole  (PRILOSEC) 40 MG capsule, Take 1 capsule (40 mg total) by mouth in the morning and at bedtime., Disp: 180 capsule, Rfl: 1   ondansetron  (ZOFRAN -ODT) 4 MG disintegrating tablet, Take 1 tablet (4 mg total) by mouth every 6 (six) hours as needed for nausea., Disp: 20 tablet, Rfl: 0   predniSONE  (DELTASONE ) 10 MG tablet, Take 10 mg by mouth daily with breakfast., Disp: , Rfl:    propranolol  (INDERAL ) 10 MG tablet, Take 2 tablets (20 mg total) by mouth 3 (three) times daily as needed (palpitations)., Disp: 60 tablet, Rfl: 11   QUEtiapine  (SEROQUEL ) 100 MG tablet, Take 1 tablet (100 mg total) by mouth at bedtime., Disp: 30 tablet,  Rfl: 3   sertraline  (ZOLOFT ) 100 MG tablet, Take 2 tablets (200 mg total) by mouth at bedtime., Disp: 60 tablet, Rfl: 3   sulfamethoxazole -trimethoprim  (BACTRIM  DS) 800-160 MG tablet, Take 1 tablet by mouth 2 (two) times daily., Disp: 5 tablet, Rfl: 0   SUMAtriptan  (IMITREX ) 50 MG tablet, Take 1 tablet at start of headache. May repeat in 2 hours if headache persists or recurs. Max 2 tablets in 24 hours., Disp: 10 tablet, Rfl: 3   traZODone  (DESYREL ) 100 MG tablet, Take 1 tablet (100 mg total) by mouth at bedtime as needed for sleep., Disp: 30 tablet, Rfl: 3   acetaminophen  (TYLENOL ) 500 MG tablet, Take 1 tablet (500 mg total) by mouth every 6 (six) hours as needed. (Patient not taking: Reported on 01/20/2024), Disp: 120 tablet, Rfl: 0   diclofenac  Sodium (VOLTAREN ) 1 % GEL, Apply 2 g topically 4 (four) times daily. (Patient not taking: Reported on 01/20/2024), Disp: 100 g, Rfl: 2   Physical Exam:   BP (!) 143/99 (BP Location: Left Arm, Patient Position: Sitting, Cuff Size: Large)   Pulse 66   Ht 5' 6 (1.676 m)   Wt (!) 304 lb (137.9 kg)   LMP 06/15/2023   SpO2 98%   BMI 49.07 kg/m   Salient findings:  CN II-XII intact Anterior rhinoscopy: Septum intact; bilateral inferior turbinates with modest hypertrophy No lesions of oral cavity/oropharynx; narrow mandible arch, does appear to have somewhat small mouth; Tongue friedman 2, tonsils small; Nasal endoscopy was indicated to better evaluate the nose and paranasal sinuses, given the patient's history and exam findings, and is detailed below. No obviously palpable neck masses/lymphadenopathy/thyromegaly No respiratory distress or stridor  Seprately Identifiable Procedures:  Prior to initiating any procedures, risks/benefits/alternatives were explained to  the patient and verbal consent obtained.  PROCEDURE: Bilateral Diagnostic Rigid Nasal Endoscopy Pre-procedure diagnosis: Nasal obstruction, recurrent sinusitis, nasal polyposis, assess  response Post-procedure diagnosis: same Indication: See pre-procedure diagnosis and physical exam above Complications: None apparent EBL: 0 mL Anesthesia: Lidocaine  4% and topical decongestant was topically sprayed in each nasal cavity  Description of Procedure:  Patient was identified. A rigid 30 degree endoscope was utilized to evaluate the sinonasal cavities, mucosa, sinus ostia and turbinates and septum.  Overall, signs of mucosal inflammation are noted. No mucopurulence, or masses noted.   Right Middle meatus: obstructed with polyps, persistent Right SE Recess: clear Left MM: obstructed with polyps, persistent Left SE Recess: clear Photodocumentation was obtained.  CPT CODE -- 68768 - Mod 25     Electronically signed by: Eldora KATHEE Blanch, MD 01/20/2024 12:00 PM    Impression & Plans:  Arelia Volpe is a 38 y.o. female with:  1. Snoring   2. Sleep-disordered breathing   3. Nasal congestion   4. Nasal polyposis   5. Hypertrophy of both inferior nasal turbinates   6. Other chronic sinusitis     Multiple issues today: - Snoring/SDB: no significant OSA and prior TFL reassuring; we discussed differences between primary snoring and OSA. Perhaps nasal polyps contributing given severity but we also discussed multiple different possible etiologies. We discussed sleep surgery possibilities but not guarantee that this would help her snoring, and given PLMs and her medications, makes more sense to treat those than surgery for snoring. Consider trying dental device --- she will contact her sleep med physician for this  - Nasal polyposis/CRS: in setting of asthma; Interestingly good sense of smell (wonder central compartment?); pred helped. . We discussed mgmt for this and she'd like to hold off on CT still and wishes to speak to pulm before doing anything. As such, will have her reach out to pulm to discuss contribution of polyps and asthma.   Consider FESS for clearance given  persistence despite medical management in setting of asthma; continue flonase  BID; will add pred forte   - f/u 3 months  See below regarding exact medications prescribed this encounter including dosages and route: No orders of the defined types were placed in this encounter.     Thank you for allowing me the opportunity to care for your patient. Please do not hesitate to contact me should you have any other questions.  Sincerely, Eldora Blanch, MD Otolaryngologist (ENT), [facility] Phone: [phone] Fax: [phone]  01/20/2024, 12:00 PM   MDM:  [phone] Complexity/Problems addressed: mod - chronic problems, multiple Data complexity:low - Morbidity: mod  - Prescription Drug prescribed or managed: y

## 2024-01-20 NOTE — Patient Instructions (Signed)
 Use flonase  two sprays each nostril twice per day Use pred forte  drops 6 drops daily

## 2024-01-28 ENCOUNTER — Encounter (HOSPITAL_COMMUNITY): Payer: Self-pay

## 2024-01-29 ENCOUNTER — Encounter: Payer: Self-pay | Admitting: Internal Medicine

## 2024-01-30 ENCOUNTER — Other Ambulatory Visit: Payer: Self-pay

## 2024-01-30 ENCOUNTER — Ambulatory Visit (HOSPITAL_COMMUNITY): Admitting: Clinical

## 2024-02-09 ENCOUNTER — Encounter: Payer: Self-pay | Admitting: Internal Medicine

## 2024-02-09 ENCOUNTER — Ambulatory Visit: Attending: Internal Medicine | Admitting: Internal Medicine

## 2024-02-09 ENCOUNTER — Other Ambulatory Visit: Payer: Self-pay

## 2024-02-09 VITALS — BP 106/77 | HR 64 | Temp 97.5°F | Ht 66.0 in | Wt 305.0 lb

## 2024-02-09 DIAGNOSIS — J339 Nasal polyp, unspecified: Secondary | ICD-10-CM | POA: Diagnosis not present

## 2024-02-09 DIAGNOSIS — G43109 Migraine with aura, not intractable, without status migrainosus: Secondary | ICD-10-CM

## 2024-02-09 DIAGNOSIS — E611 Iron deficiency: Secondary | ICD-10-CM | POA: Diagnosis not present

## 2024-02-09 DIAGNOSIS — J4530 Mild persistent asthma, uncomplicated: Secondary | ICD-10-CM | POA: Diagnosis not present

## 2024-02-09 DIAGNOSIS — Z6841 Body Mass Index (BMI) 40.0 and over, adult: Secondary | ICD-10-CM

## 2024-02-09 DIAGNOSIS — F330 Major depressive disorder, recurrent, mild: Secondary | ICD-10-CM | POA: Diagnosis not present

## 2024-02-09 DIAGNOSIS — I10 Essential (primary) hypertension: Secondary | ICD-10-CM | POA: Diagnosis not present

## 2024-02-09 MED ORDER — TOPIRAMATE 25 MG PO TABS
25.0000 mg | ORAL_TABLET | Freq: Every day | ORAL | 3 refills | Status: DC
  Filled 2024-02-09: qty 30, 30d supply, fill #0

## 2024-02-09 NOTE — Patient Instructions (Signed)
" °  VISIT SUMMARY: Today, we discussed your asthma, migraines, nasal polyps, iron deficiency anemia, depression, anxiety, and hypertension. We reviewed your current medications and made some adjustments to better manage your symptoms.  YOUR PLAN: -MIGRAINE WITH AURA: Migraines are severe headaches often accompanied by visual disturbances. Your migraines have become more frequent and severe. We have ordered an MRI of your brain to investigate these changes and prescribed Topamax  25 mg at bedtime to help prevent migraines.  -ASTHMA: Asthma is a condition where your airways narrow and swell, making it hard to breathe. Your asthma is well-controlled with your current medications. Continue using the Advair  inhaler twice daily and albuterol  as needed.  -NASAL POLYPS: Nasal polyps are noncancerous growths in the lining of your nasal passages. They may be worsening your asthma. Follow up with Dr. Theodoro, the pulmonologist, on March 24th for further evaluation.  -IRON DEFICIENCY ANEMIA: Iron deficiency anemia occurs when your body doesn't have enough iron to produce adequate red blood cells. You completed a six-week course of iron supplements. We have ordered a repeat blood count and iron level to check your current status.  -MAJOR DEPRESSIVE DISORDER AND ANXIETY DISORDER: These are mental health conditions that affect your mood and emotional well-being. Continue taking Zoloft , Buspar , and hydroxyzine  as prescribed to manage your symptoms.  -HYPERTENSION: Hypertension is high blood pressure. Your blood pressure is well-controlled with metoprolol . Continue taking metoprolol  as prescribed.  INSTRUCTIONS: Follow up with Dr. Theodoro, the pulmonologist, on March 24th for further evaluation of your nasal polyps. We have also ordered an MRI of your brain to investigate changes in your migraine patterns. Please get a repeat blood count and iron level test to assess your iron deficiency  anemia.                      Contains text generated by [employer]bridge.                                 Contains text generated by [employer]bridge.   "

## 2024-02-09 NOTE — Progress Notes (Signed)
 "   Patient ID: Kristen [name], female    DOB: [birth year]  MRN: [id number]  CC: Hypertension (HTN f/u. Suellen nasal polyps/Increased headaches & pain / pressure behind the eyes X2 weeks/Already received flu vax)   Subjective: Kristen [name] is a 39 y.o. female who presents for chronic ds management. Her concerns today include:  Pt with hx of anx/dep, IBS,  Iron def, HTN, (Propranolol  PRN for anxiety), PCOS, morbid obesity, Dep, RLS, social phobia, lymphocytic hypophysitis, hyperprolactinemia, severe persistent asthma, migraines, OSA (thought study done 03/2023 by neurology revealed no OSA), RLS, Elhers Danlo.   Discussed the use of AI scribe software for clinical note transcription with the patient, who gave verbal consent to proceed.  History of Present Illness Kristen [name] is a 39 year old female with asthma and migraines who presents for a four-month follow-up visit.  Her asthma is well-controlled with Advair  inhaler twice daily and albuterol  as needed. Recently diagnosed with nasal polyps by an ENT specialist. She is awaiting further evaluation by a pulmonologist to see if the polyps are related to her asthma or contributes to her snoring. Appt scheduled for March.  Over the past two months, her migraine pattern has changed, with headaches becoming more frequent and centered behind the eyes and frontal region, occurring two to three times a week. Previously, she experienced up to five migraines a month. The headaches are associated with visual disturbances, such as seeing colors and flashing lights which is not new for her but what is new is that they are now triggered by bright lights, including LED headlights. She uses sumatriptan  50 mg as needed, which provides relief within ten minutes but causes drowsiness. Nausea and vomiting accompany her migraines.   She has a history of iron deficiency and completed a six-week course of iron supplements, though she continues to take  them occasionally. Plan is for us  to recheck CBC and iron studies.  HTN:  Her blood pressure is well-controlled on metoprolol , and she monitors it once or twice a week at home.  MDD/GAD: She continues to follow with behavioral health for depression and anxiety.  She reports that she is doing well on Zoloft , BuSpar  and hydroxyzine .  However she has experienced some emotional roller coaster and dealing with her dad's illness.  This visit with her behavioral health provider 1 month ago, PHQ-9 score was 17.  Patient states she was going through some emotional difficulties at that time due to her dad's illness.  Denies any suicidal ideation at this time.  Obesity: Reports that her eating habits have remained about the same.  She eats small portion sizes.  She now has the co-pay money available to schedule with medical weight management.  She plans to call them tomorrow.  She has not touch base with her insurance as yet to inquire whether they would pay for gym membership.    Patient Active Problem List   Diagnosis Date Noted   Abnormal uterine bleeding 07/01/2023   Abnormal uterine bleeding (AUB) 07/01/2023   Melena 02/25/2023   Abdominal pain, chronic, epigastric 02/25/2023   Diarrhea    Chronic diarrhea    Weight gain 11/01/2020   Hirsutism 11/01/2020   Gastroesophageal reflux disease without esophagitis 07/11/2020   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   POTS (postural orthostatic tachycardia syndrome) 06/09/2020   Morbid obesity (HCC) 12/14/2019   Endometrial polyp    Encounter for autism screening 11/18/2019   DUB (dysfunctional uterine bleeding) 11/08/2019   PTSD (post-traumatic stress disorder) 09/20/2019  Irritable bowel syndrome with diarrhea 04/23/2019   Abnormal serum thyroid  stimulating hormone (TSH) level 04/23/2019   OSA on CPAP 03/17/2019   Anxiety and depression 03/12/2019   Chronic migraine with aura 03/12/2019   Essential hypertension 03/12/2019   Mild intermittent asthma  without complication 03/12/2019   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/15/2016   Elevated prolactin level 07/05/2016   Vulvar lesion 07/05/2016   Restless legs 05/24/2016   Seasonal allergies 05/13/2014   Intertrigo 05/13/2014   Decreased vision 05/13/2014   PCOS (polycystic ovarian syndrome) 02/07/2014   Social phobia 02/02/2007   Attention deficit disorder 02/02/2007   DYSLEXIA 02/02/2007   Idiopathic scoliosis and kyphoscoliosis 02/02/2007   OTHER SPECIFIED CONGENITAL ANOMALIES OF KIDNEY 02/02/2007   ELECTROCARDIOGRAM, ABNORMAL 02/02/2007     Medications Ordered Prior to Encounter[1]  Allergies[2]  Social History   Socioeconomic History   Marital status: Single    Spouse name: Not on file   Number of children: 0   Years of education: college    Highest education level: Not on file  Occupational History   Occupation: Unemployed   Tobacco Use   Smoking status: Never    Passive exposure: Past   Smokeless tobacco: Never  Vaping Use   Vaping status: Never Used  Substance and Sexual Activity   Alcohol use: No   Drug use: No   Sexual activity: Yes    Birth control/protection: I.U.D.  Other Topics Concern   Not on file  Social History Narrative   Lives with mom Sheliah)    Right-handed   Caffeine : 1 cups of coffee per day (rare)   Working not working.  Disabled.    Social Drivers of Health   Tobacco Use: Low Risk (01/20/2024)   Patient History    Smoking Tobacco Use: Never    Smokeless Tobacco Use: Never    Passive Exposure: Past  Financial Resource Strain: Medium Risk (07/28/2023)   Overall Financial Resource Strain (CARDIA)    Difficulty of Paying Living Expenses: Somewhat hard  Food Insecurity: Food Insecurity Present (09/18/2023)   Epic    Worried About Programme Researcher, Broadcasting/film/video in the Last Year: Often true    Ran Out of Food in the Last Year: Sometimes true  Transportation Needs: No Transportation Needs (07/28/2023)   Epic    Lack of Transportation (Medical): No     Lack of Transportation (Non-Medical): No  Physical Activity: Insufficiently Active (07/28/2023)   Exercise Vital Sign    Days of Exercise per Week: 2 days    Minutes of Exercise per Session: 10 min  Stress: Stress Concern Present (07/28/2023)   [employer] of Occupational Health - Occupational Stress Questionnaire    Feeling of Stress: To some extent  Social Connections: Socially Isolated (07/28/2023)   Social Connection and Isolation Panel    Frequency of Communication with Friends and Family: Once a week    Frequency of Social Gatherings with Friends and Family: Once a week    Attends Religious Services: Never    Database Administrator or Organizations: No    Attends Banker Meetings: Never    Marital Status: Never married  Intimate Partner Violence: Not At Risk (07/28/2023)   Epic    Fear of Current or Ex-Partner: No    Emotionally Abused: No    Physically Abused: No    Sexually Abused: No  Depression (PHQ2-9): Medium Risk (02/09/2024)   Depression (PHQ2-9)    PHQ-2 Score: 10  Alcohol Screen: Low Risk (07/28/2023)  Alcohol Screen    Last Alcohol Screening Score (AUDIT): 0  Housing: Low Risk (07/28/2023)   Epic    Unable to Pay for Housing in the Last Year: No    Number of Times Moved in the Last Year: 0    Homeless in the Last Year: No  Utilities: Not At Risk (07/28/2023)   Epic    Threatened with loss of utilities: No  Health Literacy: Adequate Health Literacy (07/28/2023)   B1300 Health Literacy    Frequency of need for help with medical instructions: Never    Family History  Problem Relation Age of Onset   Colon cancer Mother    Hypertension Mother    Arnold-Chiari malformation Mother    Restless legs syndrome Mother    Osteoporosis Mother    COPD Mother    Asthma Mother    Squamous cell carcinoma Mother        skin    Eczema Mother    Arthritis Mother    Peripheral Artery Disease Mother    Anxiety disorder Mother    OCD Mother    Colonic polyp  Mother    Stroke Mother    Hypertension Father    Scoliosis Father    Bipolar disorder Father    Congestive Heart Failure Father    COPD Father    Depression Father    Alcohol abuse Maternal Aunt    Depression Maternal Aunt    Pancreatic cancer Maternal Aunt    Alcohol abuse Paternal Aunt    Depression Paternal Aunt    Diabetes Maternal Grandmother    Hypertension Maternal Grandmother    Dementia Maternal Grandmother    OCD Maternal Grandmother    Alzheimer's disease Maternal Grandmother    Bone cancer Maternal Grandfather 63       bone marrow    Multiple myeloma Maternal Grandfather    Alcohol abuse Maternal Grandfather    Drug abuse Maternal Grandfather    Colon polyps Paternal Grandmother    Colon cancer Paternal Grandmother    Cancer Paternal Grandmother        colon   Diabetes Paternal Grandfather    Alcohol abuse Paternal Grandfather    Drug abuse Paternal Grandfather    Dementia Paternal Grandfather    ADD / ADHD Cousin    Esophageal cancer Neg Hx    Rectal cancer Neg Hx    Stomach cancer Neg Hx     Past Surgical History:  Procedure Laterality Date   BIOPSY  06/11/2021   Procedure: BIOPSY;  Surgeon: Shila Gustav GAILS, MD;  Location: [facility];  Service: Gastroenterology;;   BIOPSY  02/25/2023   Procedure: BIOPSY;  Surgeon: Shila Gustav GAILS, MD;  Location: [facility];  Service: Gastroenterology;;   BLADDER SURGERY     bladder stretched as a child   CHOLECYSTECTOMY  2003   COLONOSCOPY WITH PROPOFOL  N/A 06/11/2021   Procedure: COLONOSCOPY WITH PROPOFOL ;  Surgeon: Nandigam, Kavitha V, MD;  Location: [facility];  Service: Gastroenterology;  Laterality: N/A;   CYSTOSCOPY N/A 07/01/2023   Procedure: CYSTOSCOPY;  Surgeon: Jeralyn Crutch, MD;  Location: [facility];  Service: Gynecology;  Laterality: N/A;   DILATATION & CURETTAGE/HYSTEROSCOPY WITH MYOSURE N/A 12/14/2019   Procedure: DILATATION & CURETTAGE/HYSTEROSCOPY WITH Polypectomy with MYOSURE;  Surgeon:  Herchel Gloris LABOR, MD;  Location: [facility];  Service: Gynecology;  Laterality: N/A;   ESOPHAGOGASTRODUODENOSCOPY (EGD) WITH PROPOFOL  N/A 02/25/2023   Procedure: ESOPHAGOGASTRODUODENOSCOPY (EGD) WITH PROPOFOL ;  Surgeon: Shila Gustav GAILS, MD;  Location: [facility];  Service: Gastroenterology;  Laterality: N/A;   INTRAUTERINE DEVICE (IUD) INSERTION N/A 01/21/2023   Procedure: INTRAUTERINE DEVICE (IUD) INSERTION(MIRENA );  Surgeon: Eveline Lynwood MATSU, MD;  [facility]: [facility];  Service: Gynecology;  Laterality: N/A;   MOHS SURGERY Left 11/2022   Right side and left leg   OPEN REDUCTION INTERNAL FIXATION (ORIF) PROXIMAL PHALANX Right 07/23/2013   Procedure: OPEN TREATMENT RIGHT RING FINGER, PROXIMAL INTERPHALANGEAL/JOINT FRACTURE DISLOCATION;  Surgeon: Alm DELENA Hummer, MD;  Location: [facility];  Service: Orthopedics;  Laterality: Right;   TONSILLECTOMY     and adenoidectomy   TOTAL LAPAROSCOPIC HYSTERECTOMY WITH SALPINGECTOMY Bilateral 07/01/2023   Procedure: HYSTERECTOMY, TOTAL, LAPAROSCOPIC, WITH SALPINGECTOMY;  Surgeon: Jeralyn Crutch, MD;  Location: [facility];  Service: Gynecology;  Laterality: Bilateral;    ROS: Review of Systems Negative except as stated above  PHYSICAL EXAM: BP 106/77 (BP Location: Left Arm, Patient Position: Sitting, Cuff Size: Large)   Pulse 64   Temp (!) 97.5 F (36.4 C) (Oral)   Ht 5' 6 (1.676 m)   Wt (!) 305 lb (138.3 kg)   LMP 06/15/2023   SpO2 97%   BMI 49.23 kg/m   Wt Readings from Last 3 Encounters:  02/09/24 (!) 305 lb (138.3 kg)  01/20/24 (!) 304 lb (137.9 kg)  12/17/23 (!) 304 lb (137.9 kg)    Physical Exam  General appearance - alert, well appearing, and in no distress Mental status - normal mood, behavior, speech, dress, motor activity, and thought processes Nose -moderate enlargement of nasal turbinates left greater than right. Chest - clear to auscultation, no wheezes, rales or rhonchi, symmetric air  entry Heart - normal rate, regular rhythm, normal S1, S2, no murmurs, rubs, clicks or gallops Neurological - alert, oriented, normal speech, no focal findings or movement disorder noted, cranial nerves II through XII intact, motor and sensory grossly normal bilaterally, Romberg sign negative, normal gait and station Extremities - no LE edema     02/09/2024    5:13 PM 01/08/2024   11:33 AM 10/02/2023    5:19 PM  Depression screen PHQ 2/9  Decreased Interest 2 1 0  Down, Depressed, Hopeless 2 3 3   PHQ - 2 Score 4 4 3   Altered sleeping 3 3 3   Tired, decreased energy 3 3 3   Change in appetite 0 3 0  Feeling bad or failure about yourself  0 3 1  Trouble concentrating 0 2 0  Moving slowly or fidgety/restless 0 2 0  Suicidal thoughts 0 2 0  PHQ-9 Score 10 22 10    Difficult doing work/chores Somewhat difficult Very difficult Somewhat difficult     Data saved with a previous flowsheet row definition       Latest Ref Rng & Units 06/24/2023    2:25 PM 06/16/2023    9:53 AM 02/09/2023    6:57 PM  CMP  Glucose 70 - 99 mg/dL 889  90  91   BUN 6 - 20 mg/dL 12  14  11    Creatinine 0.44 - 1.00 mg/dL 8.97  9.04  9.09   Sodium 135 - 145 mmol/L 139  140  138   Potassium 3.5 - 5.1 mmol/L 3.9  4.5  3.6   Chloride 98 - 111 mmol/L 107  106  107   CO2 22 - 32 mmol/L 25  28  22    Calcium 8.9 - 10.3 mg/dL 9.2  9.5  8.7   Total Protein 6.5 - 8.1 g/dL  6.6   Total Bilirubin 0.0 - 1.2 mg/dL   0.6   Alkaline Phos 38 - 126 U/L   116   AST 15 - 41 U/L   26   ALT 0 - 44 U/L   29    Lipid Panel     Component Value Date/Time   CHOL 206 (H) 06/16/2023 0953   TRIG 169 (H) 06/16/2023 0953   HDL 35 (L) 06/16/2023 0953   CHOLHDL 5.9 (H) 06/16/2023 0953   VLDL 25.6 06/18/2022 1025   LDLCALC 140 (H) 06/16/2023 0953    CBC    Component Value Date/Time   WBC 9.4 10/02/2023 1614   WBC 8.5 06/24/2023 1425   RBC 4.33 10/02/2023 1614   RBC 4.92 06/24/2023 1425   HGB 11.7 10/02/2023 1614   HCT 36.1  10/02/2023 1614   PLT 255 10/02/2023 1614   MCV 83 10/02/2023 1614   MCH 27.0 10/02/2023 1614   MCH 25.0 (L) 06/24/2023 1425   MCHC 32.4 10/02/2023 1614   MCHC 30.8 06/24/2023 1425   RDW 14.5 10/02/2023 1614   LYMPHSABS 2.6 02/09/2023 1857   LYMPHSABS 3.0 06/27/2022 1434   MONOABS 0.4 02/09/2023 1857   EOSABS 0.4 02/09/2023 1857   EOSABS 0.2 06/27/2022 1434   BASOSABS 0.0 02/09/2023 1857   BASOSABS 0.1 06/27/2022 1434    ASSESSMENT AND PLAN: 1. Migraine with aura and without status migrainosus, not intractable (Primary) Patient has had change in character and frequency of her migraine headaches.  However she reports good relief still with Imitrex  50 mg.  Continue Imitrex .  Add Topamax  for prophylaxis.  Will pursue imaging study of the brain. - topiramate  (TOPAMAX ) 25 MG tablet; Take 1 tablet (25 mg total) by mouth at bedtime.  Dispense: 30 tablet; Refill: 3 - Ambulatory referral to Neurology - MR Brain W Wo Contrast; Future  2. Essential hypertension At goal.  Continue metoprolol   3. Iron deficiency Lab is closed already for the afternoon.  She will return to the lab sometime this week or next week to have CBC and iron studies checked.  4. Morbid obesity (HCC) Patient plans to call to get in with medical weight management.  She will check into the gym membership to see if it is offered through her insurance.  5. Nasal polyps Patient plans to follow-up with pulmonary in regards to this in March.  ENT wants her evaluated by the pulmonologist before considering surgical option.  6. Mild persistent asthma without complication Doing well on Advair .  7. Major depressive disorder, recurrent episode, mild PHQ-9 score done today in clinic has improved from 1 month ago.  She reports doing well on Zoloft  and is plugged in with a behavioral health provider already.  She will continue Zoloft .     Patient was given the opportunity to ask questions.  Patient verbalized understanding of  the plan and was able to repeat key elements of the plan.   This documentation was completed using Paediatric nurse.  Any transcriptional errors are unintentional.  Orders Placed This Encounter  Procedures   MR Brain W Wo Contrast   Ambulatory referral to Neurology     Requested Prescriptions   Signed Prescriptions Disp Refills   topiramate  (TOPAMAX ) 25 MG tablet 30 tablet 3    Sig: Take 1 tablet (25 mg total) by mouth at bedtime.    Return in about 4 months (around 06/08/2024).  Barnie Louder, MD, FACP     [1]  Current Outpatient Medications on  File Prior to Visit  Medication Sig Dispense Refill   albuterol  (VENTOLIN  HFA) 108 (90 Base) MCG/ACT inhaler Inhale 2 puffs into the lungs every 6 (six) hours as needed for wheezing or shortness of breath. 18 g 6   Benzocaine -Resorcinol (VAGISIL EX) Apply 1 application. topically as needed.     busPIRone  (BUSPAR ) 10 MG tablet Take 1 tablet (10 mg total) by mouth 3 (three) times daily. 90 tablet 3   Calcium Carb-Cholecalciferol (CALCIUM 500 + D PO) Take 1 tablet by mouth daily.     colestipol  (COLESTID ) 1 g tablet Take 2 tablets (2 g total) by mouth 3 (three) times daily. 180 tablet 2   fluticasone  (FLONASE ) 50 MCG/ACT nasal spray Place 2 sprays into both nostrils in the morning and at bedtime. 16 g 6   fluticasone -salmeterol (ADVAIR  HFA) 115-21 MCG/ACT inhaler Inhale 2 puffs into the lungs 2 (two) times daily. 12 g 12   gabapentin  (NEURONTIN ) 300 MG capsule Take 1 capsule (300 mg total) by mouth at bedtime. 90 capsule 1   Hydrocortisone Acetate 2.5 % CREA Apply topically.     hydrOXYzine  (ATARAX ) 25 MG tablet Take 1 tablet (25 mg total) by mouth 4 (four) times daily. 120 tablet 3   ibuprofen  (ADVIL ) 800 MG tablet Take 1 tablet (800 mg total) by mouth 3 (three) times daily with meals as needed for headache, moderate pain (pain score 4-6) or cramping. 60 tablet 0   ipratropium-albuterol  (DUONEB) 0.5-2.5 (3) MG/3ML SOLN  Take 3 mLs by nebulization every 6 (six) hours as needed. 90 mL 1   loperamide (IMODIUM A-D) 2 MG tablet Take 2 mg by mouth 4 (four) times daily as needed for diarrhea or loose stools.     metoprolol  tartrate (LOPRESSOR ) 25 MG tablet Take 1 tablet (25 mg total) by mouth 2 (two) times daily. 60 tablet 0   nystatin  (MYCOSTATIN /NYSTOP ) powder Apply 1 Application topically 2 (two) times daily. 15 g 4   omeprazole  (PRILOSEC) 40 MG capsule Take 1 capsule (40 mg total) by mouth in the morning and at bedtime. 180 capsule 1   ondansetron  (ZOFRAN -ODT) 4 MG disintegrating tablet Take 1 tablet (4 mg total) by mouth every 6 (six) hours as needed for nausea. 20 tablet 0   prednisoLONE  acetate (PRED FORTE ) 1 % ophthalmic suspension Put 6 drops of steroid in your nose daily 15 mL 5   predniSONE  (DELTASONE ) 10 MG tablet Take 10 mg by mouth daily with breakfast.     propranolol  (INDERAL ) 10 MG tablet Take 2 tablets (20 mg total) by mouth 3 (three) times daily as needed (palpitations). 60 tablet 11   QUEtiapine  (SEROQUEL ) 100 MG tablet Take 1 tablet (100 mg total) by mouth at bedtime. 30 tablet 3   sertraline  (ZOLOFT ) 100 MG tablet Take 2 tablets (200 mg total) by mouth at bedtime. 60 tablet 3   SUMAtriptan  (IMITREX ) 50 MG tablet Take 1 tablet at start of headache. May repeat in 2 hours if headache persists or recurs. Max 2 tablets in 24 hours. 10 tablet 3   traZODone  (DESYREL ) 100 MG tablet Take 1 tablet (100 mg total) by mouth at bedtime as needed for sleep. 30 tablet 3   acetaminophen  (TYLENOL ) 500 MG tablet Take 1 tablet (500 mg total) by mouth every 6 (six) hours as needed. (Patient not taking: Reported on 02/09/2024) 120 tablet 0   No current facility-administered medications on file prior to visit.  [2]  Allergies Allergen Reactions   Latex Hives and Swelling  Mucinex [Guaifenesin Er] Hives, Itching and Swelling   Other Anaphylaxis and Nausea Only    Pickles - throat swelling and nausea    Penicillins  Swelling    Did it involve swelling of the face/tongue/throat, SOB, or low BP? Yes Did it involve sudden or severe rash/hives, skin peeling, or any reaction on the inside of your mouth or nose? No Did you need to seek medical attention at a hospital or doctor's office? Yes When did it last happen?      >10 years ago If all above answers are NO, may proceed with cephalosporin use.    Contrast Media [Iodinated Contrast Media] Nausea And Vomiting   Multihance  [Gadobenate] Nausea And Vomiting   "

## 2024-02-12 ENCOUNTER — Encounter: Payer: Self-pay | Admitting: Internal Medicine

## 2024-02-15 ENCOUNTER — Other Ambulatory Visit: Payer: Self-pay | Admitting: Internal Medicine

## 2024-02-15 DIAGNOSIS — G43109 Migraine with aura, not intractable, without status migrainosus: Secondary | ICD-10-CM

## 2024-02-26 ENCOUNTER — Ambulatory Visit
Admission: RE | Admit: 2024-02-26 | Discharge: 2024-02-26 | Disposition: A | Discharge status: Home / Self Care | Source: Ambulatory Visit | Attending: Internal Medicine | Admitting: Internal Medicine

## 2024-02-26 DIAGNOSIS — G43109 Migraine with aura, not intractable, without status migrainosus: Secondary | ICD-10-CM

## 2024-02-29 ENCOUNTER — Ambulatory Visit: Payer: Self-pay | Admitting: Internal Medicine

## 2024-03-01 ENCOUNTER — Other Ambulatory Visit: Payer: Self-pay

## 2024-03-01 ENCOUNTER — Other Ambulatory Visit: Payer: Self-pay | Admitting: Internal Medicine

## 2024-03-01 DIAGNOSIS — G43109 Migraine with aura, not intractable, without status migrainosus: Secondary | ICD-10-CM

## 2024-03-01 DIAGNOSIS — J320 Chronic maxillary sinusitis: Secondary | ICD-10-CM

## 2024-03-01 MED ORDER — TOPIRAMATE 50 MG PO TABS
50.0000 mg | ORAL_TABLET | Freq: Every day | ORAL | 2 refills | Status: AC
  Filled 2024-03-01: qty 30, 30d supply, fill #0

## 2024-03-10 ENCOUNTER — Other Ambulatory Visit: Payer: Self-pay

## 2024-03-17 ENCOUNTER — Telehealth (HOSPITAL_COMMUNITY): Admitting: Psychiatry

## 2024-04-19 ENCOUNTER — Ambulatory Visit (INDEPENDENT_AMBULATORY_CARE_PROVIDER_SITE_OTHER): Admitting: Otolaryngology

## 2024-04-27 ENCOUNTER — Encounter

## 2024-06-11 ENCOUNTER — Ambulatory Visit: Payer: Self-pay | Admitting: Internal Medicine

## 2024-06-23 ENCOUNTER — Institutional Professional Consult (permissible substitution): Admitting: Neurology

## 2024-12-02 ENCOUNTER — Ambulatory Visit: Admitting: Dermatology
